# Patient Record
Sex: Female | Born: 1945 | Race: Asian | Hispanic: No | Smoking: Never smoker
Health system: Southern US, Community
[De-identification: ages and names within clinical notes are randomized; demographics above are authoritative.]

## PROBLEM LIST (undated history)

## (undated) DIAGNOSIS — D509 Iron deficiency anemia, unspecified: Secondary | ICD-10-CM

## (undated) DIAGNOSIS — K219 Gastro-esophageal reflux disease without esophagitis: Secondary | ICD-10-CM

## (undated) DIAGNOSIS — E7800 Pure hypercholesterolemia, unspecified: Secondary | ICD-10-CM

## (undated) DIAGNOSIS — L409 Psoriasis, unspecified: Secondary | ICD-10-CM

## (undated) DIAGNOSIS — E039 Hypothyroidism, unspecified: Secondary | ICD-10-CM

## (undated) DIAGNOSIS — M1990 Unspecified osteoarthritis, unspecified site: Secondary | ICD-10-CM

## (undated) DIAGNOSIS — N3289 Other specified disorders of bladder: Secondary | ICD-10-CM

## (undated) DIAGNOSIS — I10 Essential (primary) hypertension: Secondary | ICD-10-CM

## (undated) DIAGNOSIS — N189 Chronic kidney disease, unspecified: Principal | ICD-10-CM

## (undated) DIAGNOSIS — D631 Anemia in chronic kidney disease: Secondary | ICD-10-CM

## (undated) DIAGNOSIS — F419 Anxiety disorder, unspecified: Secondary | ICD-10-CM

## (undated) DIAGNOSIS — Z9289 Personal history of other medical treatment: Secondary | ICD-10-CM

## (undated) HISTORY — DX: Iron deficiency anemia, unspecified: D50.9

## (undated) HISTORY — DX: Pure hypercholesterolemia, unspecified: E78.00

## (undated) HISTORY — PX: OTHER SURGICAL HISTORY: SHX169

## (undated) HISTORY — DX: Unspecified osteoarthritis, unspecified site: M19.90

## (undated) HISTORY — DX: Anemia in chronic kidney disease: D63.1

## (undated) HISTORY — PX: CERVICAL DISC SURGERY: SHX588

## (undated) HISTORY — DX: Gastro-esophageal reflux disease without esophagitis: K21.9

## (undated) HISTORY — PX: ABDOMINAL HYSTERECTOMY: SHX81

## (undated) HISTORY — DX: Essential (primary) hypertension: I10

## (undated) HISTORY — PX: BREAST BIOPSY: SHX20

## (undated) HISTORY — DX: Personal history of other medical treatment: Z92.89

## (undated) HISTORY — PX: EYE SURGERY: SHX253

## (undated) HISTORY — PX: BREAST EXCISIONAL BIOPSY: SUR124

## (undated) HISTORY — DX: Other specified disorders of bladder: N32.89

## (undated) HISTORY — PX: CATARACT EXTRACTION: SUR2

## (undated) HISTORY — DX: Chronic kidney disease, unspecified: N18.9

---

## 1997-10-20 ENCOUNTER — Ambulatory Visit (HOSPITAL_COMMUNITY): Admission: RE | Admit: 1997-10-20 | Discharge: 1997-10-20 | Payer: Self-pay | Admitting: Cardiology

## 1998-03-15 ENCOUNTER — Ambulatory Visit (HOSPITAL_COMMUNITY): Admission: RE | Admit: 1998-03-15 | Discharge: 1998-03-15 | Payer: Self-pay | Admitting: Gastroenterology

## 1998-04-24 ENCOUNTER — Encounter: Payer: Self-pay | Admitting: Emergency Medicine

## 1998-04-24 ENCOUNTER — Emergency Department (HOSPITAL_COMMUNITY): Admission: EM | Admit: 1998-04-24 | Discharge: 1998-04-24 | Payer: Self-pay | Admitting: Emergency Medicine

## 1998-06-29 ENCOUNTER — Other Ambulatory Visit: Admission: RE | Admit: 1998-06-29 | Discharge: 1998-06-29 | Payer: Self-pay | Admitting: Obstetrics and Gynecology

## 1998-08-30 ENCOUNTER — Ambulatory Visit (HOSPITAL_COMMUNITY): Admission: RE | Admit: 1998-08-30 | Discharge: 1998-08-30 | Payer: Self-pay | Admitting: Gastroenterology

## 1999-03-15 ENCOUNTER — Encounter: Admission: RE | Admit: 1999-03-15 | Discharge: 1999-03-15 | Payer: Self-pay | Admitting: General Surgery

## 1999-03-15 ENCOUNTER — Encounter: Payer: Self-pay | Admitting: General Surgery

## 1999-06-28 ENCOUNTER — Encounter: Admission: RE | Admit: 1999-06-28 | Discharge: 1999-06-28 | Payer: Self-pay | Admitting: Gastroenterology

## 1999-06-28 ENCOUNTER — Encounter: Payer: Self-pay | Admitting: Gastroenterology

## 1999-08-30 ENCOUNTER — Encounter: Admission: RE | Admit: 1999-08-30 | Discharge: 1999-08-30 | Payer: Self-pay | Admitting: General Surgery

## 1999-08-30 ENCOUNTER — Other Ambulatory Visit: Admission: RE | Admit: 1999-08-30 | Discharge: 1999-08-30 | Payer: Self-pay | Admitting: General Surgery

## 1999-08-30 ENCOUNTER — Encounter: Payer: Self-pay | Admitting: General Surgery

## 1999-08-31 ENCOUNTER — Ambulatory Visit (HOSPITAL_BASED_OUTPATIENT_CLINIC_OR_DEPARTMENT_OTHER): Admission: RE | Admit: 1999-08-31 | Discharge: 1999-08-31 | Payer: Self-pay | Admitting: General Surgery

## 1999-08-31 ENCOUNTER — Encounter (INDEPENDENT_AMBULATORY_CARE_PROVIDER_SITE_OTHER): Payer: Self-pay | Admitting: Specialist

## 1999-09-18 ENCOUNTER — Other Ambulatory Visit: Admission: RE | Admit: 1999-09-18 | Discharge: 1999-09-18 | Payer: Self-pay | Admitting: Obstetrics and Gynecology

## 1999-12-31 ENCOUNTER — Encounter: Payer: Self-pay | Admitting: Otolaryngology

## 1999-12-31 ENCOUNTER — Encounter: Admission: RE | Admit: 1999-12-31 | Discharge: 1999-12-31 | Payer: Self-pay | Admitting: Otolaryngology

## 2000-03-10 ENCOUNTER — Ambulatory Visit (HOSPITAL_COMMUNITY): Admission: RE | Admit: 2000-03-10 | Discharge: 2000-03-10 | Payer: Self-pay | Admitting: Cardiology

## 2000-03-10 ENCOUNTER — Encounter: Payer: Self-pay | Admitting: Cardiology

## 2000-09-17 ENCOUNTER — Encounter: Payer: Self-pay | Admitting: General Surgery

## 2000-09-17 ENCOUNTER — Encounter: Admission: RE | Admit: 2000-09-17 | Discharge: 2000-09-17 | Payer: Self-pay | Admitting: General Surgery

## 2000-12-30 ENCOUNTER — Other Ambulatory Visit: Admission: RE | Admit: 2000-12-30 | Discharge: 2000-12-30 | Payer: Self-pay | Admitting: Obstetrics and Gynecology

## 2001-02-09 ENCOUNTER — Encounter: Admission: RE | Admit: 2001-02-09 | Discharge: 2001-02-09 | Payer: Self-pay | Admitting: Emergency Medicine

## 2001-02-09 ENCOUNTER — Encounter: Payer: Self-pay | Admitting: Emergency Medicine

## 2001-06-24 ENCOUNTER — Encounter: Payer: Self-pay | Admitting: General Surgery

## 2001-06-24 ENCOUNTER — Encounter: Admission: RE | Admit: 2001-06-24 | Discharge: 2001-06-24 | Payer: Self-pay | Admitting: General Surgery

## 2001-07-21 ENCOUNTER — Encounter: Payer: Self-pay | Admitting: Urology

## 2001-07-21 ENCOUNTER — Encounter: Admission: RE | Admit: 2001-07-21 | Discharge: 2001-07-21 | Payer: Self-pay | Admitting: Urology

## 2001-10-07 ENCOUNTER — Encounter: Admission: RE | Admit: 2001-10-07 | Discharge: 2001-10-07 | Payer: Self-pay | Admitting: General Surgery

## 2001-10-07 ENCOUNTER — Encounter: Payer: Self-pay | Admitting: General Surgery

## 2001-10-14 ENCOUNTER — Encounter: Payer: Self-pay | Admitting: Cardiology

## 2001-10-14 ENCOUNTER — Ambulatory Visit (HOSPITAL_COMMUNITY): Admission: RE | Admit: 2001-10-14 | Discharge: 2001-10-14 | Payer: Self-pay | Admitting: Cardiology

## 2001-10-27 ENCOUNTER — Encounter: Admission: RE | Admit: 2001-10-27 | Discharge: 2001-10-27 | Payer: Self-pay | Admitting: Emergency Medicine

## 2001-10-27 ENCOUNTER — Encounter: Payer: Self-pay | Admitting: Emergency Medicine

## 2002-01-04 ENCOUNTER — Other Ambulatory Visit: Admission: RE | Admit: 2002-01-04 | Discharge: 2002-01-04 | Payer: Self-pay | Admitting: Obstetrics and Gynecology

## 2002-01-29 ENCOUNTER — Encounter: Admission: RE | Admit: 2002-01-29 | Discharge: 2002-01-29 | Payer: Self-pay | Admitting: Obstetrics and Gynecology

## 2002-01-29 ENCOUNTER — Encounter: Payer: Self-pay | Admitting: Obstetrics and Gynecology

## 2002-03-23 ENCOUNTER — Ambulatory Visit (HOSPITAL_COMMUNITY): Admission: RE | Admit: 2002-03-23 | Discharge: 2002-03-23 | Payer: Self-pay | Admitting: Gastroenterology

## 2002-03-30 ENCOUNTER — Ambulatory Visit (HOSPITAL_BASED_OUTPATIENT_CLINIC_OR_DEPARTMENT_OTHER): Admission: RE | Admit: 2002-03-30 | Discharge: 2002-03-30 | Payer: Self-pay | Admitting: Urology

## 2002-03-30 ENCOUNTER — Encounter (INDEPENDENT_AMBULATORY_CARE_PROVIDER_SITE_OTHER): Payer: Self-pay | Admitting: Specialist

## 2002-07-06 ENCOUNTER — Encounter: Admission: RE | Admit: 2002-07-06 | Discharge: 2002-07-06 | Payer: Self-pay | Admitting: Emergency Medicine

## 2002-07-06 ENCOUNTER — Encounter: Payer: Self-pay | Admitting: Emergency Medicine

## 2002-07-23 ENCOUNTER — Ambulatory Visit (HOSPITAL_COMMUNITY): Admission: RE | Admit: 2002-07-23 | Discharge: 2002-07-23 | Payer: Self-pay | Admitting: Gastroenterology

## 2002-10-11 ENCOUNTER — Encounter: Admission: RE | Admit: 2002-10-11 | Discharge: 2002-10-11 | Payer: Self-pay | Admitting: General Surgery

## 2002-10-11 ENCOUNTER — Encounter: Payer: Self-pay | Admitting: General Surgery

## 2002-12-07 ENCOUNTER — Ambulatory Visit (HOSPITAL_COMMUNITY): Admission: RE | Admit: 2002-12-07 | Discharge: 2002-12-07 | Payer: Self-pay | Admitting: Neurology

## 2003-02-04 ENCOUNTER — Other Ambulatory Visit: Admission: RE | Admit: 2003-02-04 | Discharge: 2003-02-04 | Payer: Self-pay | Admitting: Obstetrics and Gynecology

## 2003-02-23 ENCOUNTER — Encounter: Admission: RE | Admit: 2003-02-23 | Discharge: 2003-02-23 | Payer: Self-pay | Admitting: Emergency Medicine

## 2003-06-15 ENCOUNTER — Ambulatory Visit (HOSPITAL_COMMUNITY): Admission: RE | Admit: 2003-06-15 | Discharge: 2003-06-15 | Payer: Self-pay | Admitting: Cardiology

## 2003-10-19 ENCOUNTER — Encounter: Admission: RE | Admit: 2003-10-19 | Discharge: 2003-10-19 | Payer: Self-pay | Admitting: General Surgery

## 2003-12-23 ENCOUNTER — Ambulatory Visit (HOSPITAL_COMMUNITY): Admission: RE | Admit: 2003-12-23 | Discharge: 2003-12-23 | Payer: Self-pay | Admitting: Gastroenterology

## 2004-10-19 ENCOUNTER — Ambulatory Visit (HOSPITAL_COMMUNITY): Admission: RE | Admit: 2004-10-19 | Discharge: 2004-10-19 | Payer: Self-pay | Admitting: Cardiology

## 2004-10-19 ENCOUNTER — Encounter: Admission: RE | Admit: 2004-10-19 | Discharge: 2004-10-19 | Payer: Self-pay | Admitting: General Surgery

## 2004-11-13 ENCOUNTER — Encounter: Admission: RE | Admit: 2004-11-13 | Discharge: 2004-11-13 | Payer: Self-pay | Admitting: Emergency Medicine

## 2004-11-23 ENCOUNTER — Encounter: Admission: RE | Admit: 2004-11-23 | Discharge: 2004-11-23 | Payer: Self-pay | Admitting: Emergency Medicine

## 2004-12-11 ENCOUNTER — Encounter: Admission: RE | Admit: 2004-12-11 | Discharge: 2004-12-11 | Payer: Self-pay | Admitting: Emergency Medicine

## 2004-12-26 ENCOUNTER — Encounter: Admission: RE | Admit: 2004-12-26 | Discharge: 2004-12-26 | Payer: Self-pay | Admitting: Emergency Medicine

## 2005-01-16 ENCOUNTER — Encounter: Admission: RE | Admit: 2005-01-16 | Discharge: 2005-01-16 | Payer: Self-pay | Admitting: Emergency Medicine

## 2005-09-06 ENCOUNTER — Encounter: Admission: RE | Admit: 2005-09-06 | Discharge: 2005-09-06 | Payer: Self-pay | Admitting: General Surgery

## 2005-10-11 ENCOUNTER — Encounter (HOSPITAL_COMMUNITY): Admission: RE | Admit: 2005-10-11 | Discharge: 2005-12-05 | Payer: Self-pay | Admitting: Cardiology

## 2006-09-11 ENCOUNTER — Encounter: Admission: RE | Admit: 2006-09-11 | Discharge: 2006-09-11 | Payer: Self-pay | Admitting: General Surgery

## 2006-09-23 ENCOUNTER — Encounter: Admission: RE | Admit: 2006-09-23 | Discharge: 2006-09-23 | Payer: Self-pay | Admitting: Gastroenterology

## 2006-11-11 ENCOUNTER — Encounter: Admission: RE | Admit: 2006-11-11 | Discharge: 2006-11-11 | Payer: Self-pay | Admitting: Orthopedic Surgery

## 2007-03-23 ENCOUNTER — Encounter: Admission: RE | Admit: 2007-03-23 | Discharge: 2007-03-23 | Payer: Self-pay | Admitting: Cardiology

## 2007-03-29 ENCOUNTER — Encounter: Admission: RE | Admit: 2007-03-29 | Discharge: 2007-03-29 | Payer: Self-pay | Admitting: Emergency Medicine

## 2007-09-14 ENCOUNTER — Encounter: Admission: RE | Admit: 2007-09-14 | Discharge: 2007-09-14 | Payer: Self-pay | Admitting: General Surgery

## 2007-11-11 ENCOUNTER — Encounter: Admission: RE | Admit: 2007-11-11 | Discharge: 2007-11-11 | Payer: Self-pay | Admitting: Emergency Medicine

## 2007-11-25 ENCOUNTER — Ambulatory Visit (HOSPITAL_COMMUNITY): Admission: RE | Admit: 2007-11-25 | Discharge: 2007-11-25 | Payer: Self-pay | Admitting: Cardiology

## 2007-12-02 ENCOUNTER — Encounter: Admission: RE | Admit: 2007-12-02 | Discharge: 2007-12-02 | Payer: Self-pay | Admitting: Emergency Medicine

## 2007-12-15 ENCOUNTER — Encounter: Admission: RE | Admit: 2007-12-15 | Discharge: 2007-12-15 | Payer: Self-pay | Admitting: Emergency Medicine

## 2008-04-18 ENCOUNTER — Encounter: Admission: RE | Admit: 2008-04-18 | Discharge: 2008-04-18 | Payer: Self-pay | Admitting: Dermatology

## 2008-09-12 ENCOUNTER — Encounter: Admission: RE | Admit: 2008-09-12 | Discharge: 2008-09-12 | Payer: Self-pay | Admitting: Neurosurgery

## 2008-09-14 ENCOUNTER — Encounter: Admission: RE | Admit: 2008-09-14 | Discharge: 2008-09-14 | Payer: Self-pay | Admitting: General Surgery

## 2008-12-02 ENCOUNTER — Encounter: Admission: RE | Admit: 2008-12-02 | Discharge: 2008-12-02 | Payer: Self-pay | Admitting: Internal Medicine

## 2008-12-30 ENCOUNTER — Other Ambulatory Visit: Payer: Self-pay | Admitting: Neurosurgery

## 2009-01-02 ENCOUNTER — Ambulatory Visit: Payer: Self-pay | Admitting: Internal Medicine

## 2009-01-02 ENCOUNTER — Inpatient Hospital Stay (HOSPITAL_COMMUNITY): Admission: EM | Admit: 2009-01-02 | Discharge: 2009-01-11 | Payer: Self-pay | Admitting: Emergency Medicine

## 2009-01-04 ENCOUNTER — Encounter (INDEPENDENT_AMBULATORY_CARE_PROVIDER_SITE_OTHER): Payer: Self-pay | Admitting: Neurosurgery

## 2009-01-04 ENCOUNTER — Encounter: Admission: RE | Admit: 2009-01-04 | Discharge: 2009-01-04 | Payer: Self-pay | Admitting: Neurosurgery

## 2009-01-06 ENCOUNTER — Ambulatory Visit: Payer: Self-pay | Admitting: Vascular Surgery

## 2009-01-06 ENCOUNTER — Encounter (INDEPENDENT_AMBULATORY_CARE_PROVIDER_SITE_OTHER): Payer: Self-pay | Admitting: Neurosurgery

## 2009-01-10 ENCOUNTER — Ambulatory Visit: Payer: Self-pay | Admitting: Physical Medicine & Rehabilitation

## 2009-01-11 ENCOUNTER — Inpatient Hospital Stay (HOSPITAL_COMMUNITY)
Admission: EM | Admit: 2009-01-11 | Discharge: 2009-01-19 | Payer: Self-pay | Admitting: Physical Medicine & Rehabilitation

## 2009-02-01 ENCOUNTER — Ambulatory Visit: Payer: Self-pay | Admitting: Pulmonary Disease

## 2009-02-01 DIAGNOSIS — R609 Edema, unspecified: Secondary | ICD-10-CM | POA: Insufficient documentation

## 2009-02-01 DIAGNOSIS — I10 Essential (primary) hypertension: Secondary | ICD-10-CM | POA: Insufficient documentation

## 2009-02-01 DIAGNOSIS — J810 Acute pulmonary edema: Secondary | ICD-10-CM | POA: Insufficient documentation

## 2009-02-07 ENCOUNTER — Telehealth: Payer: Self-pay | Admitting: Pulmonary Disease

## 2009-02-14 ENCOUNTER — Ambulatory Visit: Payer: Self-pay | Admitting: Pulmonary Disease

## 2009-02-14 LAB — CONVERTED CEMR LAB
ALT: 19 units/L (ref 0–35)
AST: 26 units/L (ref 0–37)
Albumin: 3.7 g/dL (ref 3.5–5.2)
Alkaline Phosphatase: 78 units/L (ref 39–117)
BUN: 12 mg/dL (ref 6–23)
Bilirubin, Direct: 0.1 mg/dL (ref 0.0–0.3)
CO2: 31 meq/L (ref 19–32)
Calcium: 9.4 mg/dL (ref 8.4–10.5)
Chloride: 102 meq/L (ref 96–112)
Creatinine, Ser: 1.1 mg/dL (ref 0.4–1.2)
GFR calc non Af Amer: 52.84 mL/min (ref >60)
Glucose, Bld: 102 mg/dL — ABNORMAL HIGH (ref 70–99)
Potassium: 4.5 meq/L (ref 3.5–5.1)
Sodium: 140 meq/L (ref 135–145)
Total Bilirubin: 0.7 mg/dL (ref 0.3–1.2)
Total Protein: 5.5 g/dL — ABNORMAL LOW (ref 6.0–8.3)

## 2009-02-15 ENCOUNTER — Telehealth: Payer: Self-pay | Admitting: Pulmonary Disease

## 2009-02-15 ENCOUNTER — Encounter: Payer: Self-pay | Admitting: Pulmonary Disease

## 2009-02-15 ENCOUNTER — Ambulatory Visit: Payer: Self-pay

## 2009-03-02 ENCOUNTER — Inpatient Hospital Stay (HOSPITAL_COMMUNITY): Admission: RE | Admit: 2009-03-02 | Discharge: 2009-03-03 | Payer: Self-pay | Admitting: Neurosurgery

## 2009-06-22 ENCOUNTER — Encounter: Admission: RE | Admit: 2009-06-22 | Discharge: 2009-06-22 | Payer: Self-pay | Admitting: General Surgery

## 2009-07-18 ENCOUNTER — Ambulatory Visit: Payer: Self-pay | Admitting: Hematology and Oncology

## 2009-08-01 LAB — CBC WITH DIFFERENTIAL/PLATELET
BASO%: 0.2 % (ref 0.0–2.0)
Basophils Absolute: 0 10*3/uL (ref 0.0–0.1)
EOS%: 4.6 % (ref 0.0–7.0)
Eosinophils Absolute: 0.3 10*3/uL (ref 0.0–0.5)
HCT: 30.9 % — ABNORMAL LOW (ref 34.8–46.6)
HGB: 10.3 g/dL — ABNORMAL LOW (ref 11.6–15.9)
LYMPH%: 31.3 % (ref 14.0–49.7)
MCH: 28.2 pg (ref 25.1–34.0)
MCHC: 33.5 g/dL (ref 31.5–36.0)
MCV: 84.3 fL (ref 79.5–101.0)
MONO#: 0.3 10*3/uL (ref 0.1–0.9)
MONO%: 5.1 % (ref 0.0–14.0)
NEUT#: 3.6 10*3/uL (ref 1.5–6.5)
NEUT%: 58.8 % (ref 38.4–76.8)
Platelets: 259 10*3/uL (ref 145–400)
RBC: 3.66 10*6/uL — ABNORMAL LOW (ref 3.70–5.45)
RDW: 13.7 % (ref 11.2–14.5)
WBC: 6.2 10*3/uL (ref 3.9–10.3)
lymph#: 1.9 10*3/uL (ref 0.9–3.3)

## 2009-08-01 LAB — MORPHOLOGY: PLT EST: ADEQUATE

## 2009-08-01 LAB — URINALYSIS, MICROSCOPIC - CHCC
Bilirubin (Urine): NEGATIVE
Blood: NEGATIVE
Glucose: NEGATIVE g/dL
Ketones: NEGATIVE mg/dL
Leukocyte Esterase: NEGATIVE
Nitrite: NEGATIVE
Protein: NEGATIVE mg/dL
RBC count: NEGATIVE (ref 0–2)
Specific Gravity, Urine: 1.005 (ref 1.003–1.035)
WBC, UA: NEGATIVE (ref 0–2)
pH: 6 (ref 4.6–8.0)

## 2009-08-01 LAB — HEMOGLOBIN A1C
Hgb A1c MFr Bld: 5.3 % (ref <5.7)
Mean Plasma Glucose: 105 mg/dL (ref <117)

## 2009-08-04 LAB — IRON AND TIBC
%SAT: 19 % — ABNORMAL LOW (ref 20–55)
Iron: 57 ug/dL (ref 42–145)
TIBC: 308 ug/dL (ref 250–470)
UIBC: 251 ug/dL

## 2009-08-04 LAB — PROTEIN ELECTROPHORESIS, SERUM, WITH REFLEX
Albumin ELP: 64.7 % (ref 55.8–66.1)
Alpha-1-Globulin: 3.7 % (ref 2.9–4.9)
Alpha-2-Globulin: 13.4 % — ABNORMAL HIGH (ref 7.1–11.8)
Beta 2: 3.8 % (ref 3.2–6.5)
Beta Globulin: 6.5 % (ref 4.7–7.2)
Gamma Globulin: 7.9 % — ABNORMAL LOW (ref 11.1–18.8)
Total Protein, Serum Electrophoresis: 6.3 g/dL (ref 6.0–8.3)

## 2009-08-04 LAB — IGG, IGA, IGM
IgA: 118 mg/dL (ref 68–378)
IgG (Immunoglobin G), Serum: 512 mg/dL — ABNORMAL LOW (ref 694–1618)
IgM, Serum: 34 mg/dL — ABNORMAL LOW (ref 60–263)

## 2009-08-04 LAB — COMPREHENSIVE METABOLIC PANEL
ALT: 26 U/L (ref 0–35)
AST: 28 U/L (ref 0–37)
Albumin: 4.2 g/dL (ref 3.5–5.2)
Alkaline Phosphatase: 102 U/L (ref 39–117)
BUN: 15 mg/dL (ref 6–23)
CO2: 24 mEq/L (ref 19–32)
Calcium: 9.7 mg/dL (ref 8.4–10.5)
Chloride: 103 mEq/L (ref 96–112)
Creatinine, Ser: 1 mg/dL (ref 0.40–1.20)
Glucose, Bld: 111 mg/dL — ABNORMAL HIGH (ref 70–99)
Potassium: 4.7 mEq/L (ref 3.5–5.3)
Sodium: 138 mEq/L (ref 135–145)
Total Bilirubin: 0.3 mg/dL (ref 0.3–1.2)
Total Protein: 6.3 g/dL (ref 6.0–8.3)

## 2009-08-04 LAB — ERYTHROPOIETIN: Erythropoietin: 18 m[IU]/mL (ref 2.6–34.0)

## 2009-08-04 LAB — DIRECT ANTIGLOBULIN TEST (NOT AT ARMC)
DAT (Complement): NEGATIVE
DAT IgG: NEGATIVE

## 2009-08-04 LAB — IFE INTERPRETATION

## 2009-08-04 LAB — VITAMIN B12: Vitamin B-12: 1813 pg/mL — ABNORMAL HIGH (ref 211–911)

## 2009-08-04 LAB — FERRITIN: Ferritin: 73 ng/mL (ref 10–291)

## 2009-08-10 ENCOUNTER — Other Ambulatory Visit
Admission: RE | Admit: 2009-08-10 | Discharge: 2009-08-10 | Payer: Self-pay | Source: Home / Self Care | Admitting: Hematology and Oncology

## 2009-08-11 LAB — ERYTHROPOIETIN: Erythropoietin: 13.3 m[IU]/mL (ref 2.6–34.0)

## 2009-08-22 ENCOUNTER — Ambulatory Visit: Payer: Self-pay | Admitting: Hematology and Oncology

## 2009-09-13 ENCOUNTER — Encounter: Admission: RE | Admit: 2009-09-13 | Discharge: 2009-09-13 | Payer: Self-pay | Admitting: General Surgery

## 2009-10-19 ENCOUNTER — Ambulatory Visit (HOSPITAL_COMMUNITY): Admission: RE | Admit: 2009-10-19 | Discharge: 2009-10-19 | Payer: Self-pay | Admitting: General Surgery

## 2010-01-02 ENCOUNTER — Telehealth (INDEPENDENT_AMBULATORY_CARE_PROVIDER_SITE_OTHER): Payer: Self-pay | Admitting: *Deleted

## 2010-01-16 ENCOUNTER — Ambulatory Visit: Payer: Self-pay | Admitting: Pulmonary Disease

## 2010-01-17 LAB — CONVERTED CEMR LAB
BUN: 21 mg/dL (ref 6–23)
CO2: 29 meq/L (ref 19–32)
Calcium: 10.7 mg/dL — ABNORMAL HIGH (ref 8.4–10.5)
Chloride: 96 meq/L (ref 96–112)
Creatinine, Ser: 1 mg/dL (ref 0.4–1.2)
GFR calc non Af Amer: 59.51 mL/min (ref >60)
Glucose, Bld: 124 mg/dL — ABNORMAL HIGH (ref 70–99)
Potassium: 4.8 meq/L (ref 3.5–5.1)
Pro B Natriuretic peptide (BNP): 188.7 pg/mL — ABNORMAL HIGH (ref 0.0–100.0)
Sodium: 131 meq/L — ABNORMAL LOW (ref 135–145)

## 2010-04-15 ENCOUNTER — Encounter: Payer: Self-pay | Admitting: General Surgery

## 2010-04-24 NOTE — Assessment & Plan Note (Signed)
Summary: ok to overbook per RA for swelling/mg   Visit Type:  Follow-up Copy to:  Dr. Collene Mares Primary Provider/Referring Provider:  Dr. Delfina Redwood  CC:  Pt c/o chest heaviness usually lasting 10-5 minutes, swelling in ankles, and breathing is okay. Pt requesting flu vaccine today.  History of Present Illness: [name], diabetic, non smoker for post hospital FU of pulmonary edema & ana sarca. Adm 10/20- 28 for L1-3 compression fracture after an accidental fall. CXR suggested pulmonary edema & CT chest showed BL patchy air space opacities & BL effusions. Echo showed EF 55-60%, cardiac cath showed nml coronaries, BNP was 156. E. coli UTI was treated with keflex & LFTs normalised. BP meds were changed  -tekturna, hydrallazine were stopped, Maxzide was started & [place] met dose was increased. Of note , other labs - Hb 9.8 attributed to iron deficiency, TSh 3.1, alb 2.7, UA prot neg  February 14, 2009  c/o peripheral edema. Lasix did not help much. Edema is better in evenings. Breathing is  normal.  Neck surgery planned for 12/7 by Dr Sherwood Gambler. BMET ok. duplex neg for DVT  January 16, 2010 1:56 PM  pedal edema has returned, she is concerned about 'fluid in lungs' BP better with amlodepin x 5 mnths - this controls her bp better than other drugs. NO dyspnea, cough or wheeze. BMET ok, BNP low  Preventive Screening-Counseling & Management  Alcohol-Tobacco     Smoking Status: never  Current Medications (verified): 1)  Lyrica 50 Mg Caps (Pregabalin) .... Take 1 Tablet By Mouth Three Times A Day 2)  Synthroid 88 Mcg Tabs (Levothyroxine Sodium) .... Take 1 Tablet By Mouth Once A Day 3)  Janumet 50-1000 Mg Tabs (Sitagliptin-Metformin Hcl) .... Take 1 1/2 Tablet By Mouth Once A Day 4)  Toprol Xl 100 Mg Xr24h-Tab (Metoprolol Succinate) .... Take 1  1/2tablet By Mouth Once A Day 5)  Elmiron 100 Mg Caps (Pentosan Polysulfate Sodium) .... Take 1 Tablet By Mouth Two Times A Day 6)  Nexium 40 Mg Cpdr (Esomeprazole  Magnesium) .... Take 1 Capsule By Mouth Once A Day 7)  Welchol 625 Mg Tabs (Colesevelam Hcl) .... Take 3 Tablet By Mouth Two Times A Day 8)  Aspirin 81 Mg  Tabs (Aspirin) .... Take 1 Tablet By Mouth Once A Day 9)  Multivitamins   Tabs (Multiple Vitamin) .... Take 1 Tablet By Mouth Once A Day 10)  Iron 325 (65 Fe) Mg Tabs (Ferrous Sulfate) .... Take 1 Tablet By Mouth Three Times A Day 11)  Lasix 40 Mg Tabs (Furosemide) .... Take 1 Tablet By Mouth Once A Day 12)  Norvasc 10 Mg Tabs (Amlodipine Besylate) .... Take 1 Tablet By Mouth Once A Day 13)  Welchol 3.75 Gm Pack (Colesevelam Hcl) .Marland Kitchen.. 1 Pack Daily  Allergies (verified): 1)  ! Lipitor (Atorvastatin) 2)  ! 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   Review of Systems       The patient complains of peripheral edema.  The patient denies anorexia, fever, weight loss, weight gain, vision loss, decreased hearing, hoarseness, chest pain, syncope, dyspnea on exertion, prolonged cough, headaches, hemoptysis, abdominal pain, melena, hematochezia, severe indigestion/heartburn, hematuria, muscle weakness, suspicious skin lesions, difficulty walking, depression, unusual weight change, abnormal bleeding, enlarged lymph nodes, and angioedema.    Vital Signs:  Patient profile:   65 year old  female Height:      61 inches Weight:      123.2 pounds BMI:     23.36 O2 Sat:      99 % on Room air Temp:     98.0 degrees F oral Pulse rate:   66 / minute BP sitting:   110 / 60  (left arm) Cuff size:   regular  Vitals Entered By: Iran Planas CMA (January 16, 2010 1:40 PM)  O2 Flow:  Room air CC: Pt c/o chest heaviness usually lasting 10-5 minutes, swelling in ankles, breathing is okay. Pt requesting flu vaccine today Comments Medications  reviewed with patient Verified contact number and pharmacy with patient Iran Planas [facility]  January 16, 2010 1:41 PM    Physical Exam  Additional Exam:  [id number] January 16, 2010 Gen. 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 I have asked her to aim for a body weight of around 120 lbs with a scale at home & use lasix if she reaches 125 lbs  Medications Added to Medication List This Visit: 1)  Aspirin 81 Mg Tabs (Aspirin) .... Take 1 tablet by mouth once a day 2)  Norvasc 10 Mg Tabs (Amlodipine besylate) .... Take 1 tablet by mouth once a day 3)  Welchol 3.75 Gm Pack (Colesevelam hcl) .Marland Kitchen.. 1 pack daily  Other Orders: Est. Patient Level III [id number]) Prescription Created Electronically [phone]) TLB-BMP (Basic Metabolic Panel-BMET) (99991111) TLB-BNP (B-Natriuretic Peptide) (83880-BNPR)  Patient Instructions: 1)  Copy sent to: dr Elisabeth Cara, dr Elayne Snare, dr polite 2)  Please schedule a follow-up appointment in 3 months. 3)  Weigh every other day  - your baseline wt is 120 lbs - If wt > 125 lbs , take lasix once daily x 1 week 4)  Blood work today 5)  Flu shot

## 2010-04-24 NOTE — Assessment & Plan Note (Signed)
Summary: rov//mbw   Visit Type:  Follow-up Copy to:  Dr. Collene Mares Primary Provider/Referring Provider:  Dr. Delfina Redwood  CC:  Pt here for follow up. Pt c/o ankle swelling worse when active and decrease when elevated. Pt states needs new Rx for Lasix 40mg  daily if to continue same. Pt requesting Liver function blood work.  History of Present Illness: [name], diabetic, non smoker for post hospital FU of pulmonary edema & ana sarca. Adm 10/20- 28 for L1-3 compression fracture after an accidental fall. CXR suggested pulmonary edema & CT chest showed BL patchy air space opacities & BL effusions. Echo showed EF 55-60%, cardiac cath showed nml coronaries, BNP was 156. E. coli UTI was treated with keflex & LFTs normalised. BP meds were changed  -tekturna, hydrallazine were stopped, Maxzide was started & [place] met dose was increased. Of note , other labs - Hb 9.8 attributed to iron deficiency, TSh 3.1, alb 2.7, UA prot neg  February 14, 2009  c/o peripheral edema. Lasix did not help much. Edema is better in evenings. Breathing is  normal, no cough, chest pan, palpitations.  Neck surgery planned for 12/7 by Dr Sherwood Gambler. BMET ok. sugars ok without actose & BP ok after stopping amlodepin.  Current Medications (verified): 1)  Lyrica 50 Mg Caps (Pregabalin) .... Take 1 Tablet By Mouth Three Times A Day 2)  Synthroid 88 Mcg Tabs (Levothyroxine Sodium) .... Take 1 Tablet By Mouth Once A Day 3)  Janumet 50-1000 Mg Tabs (Sitagliptin-Metformin Hcl) .... Take 1 1/2 Tablet By Mouth Once A Day 4)  Oxycodone Hcl 5 Mg Tabs (Oxycodone Hcl) .... As Needed 5)  Toprol Xl 100 Mg Xr24h-Tab (Metoprolol Succinate) .... Take 1  1/2tablet By Mouth Once A Day 6)  Elmiron 100 Mg Caps (Pentosan Polysulfate Sodium) .... Take 1 Tablet By Mouth Two Times A Day 7)  Nexium 40 Mg Cpdr (Esomeprazole Magnesium) .... Take 1 Capsule By Mouth Once A Day 8)  Welchol 625 Mg Tabs (Colesevelam Hcl) .... Take 3 Tablet By Mouth Two Times A Day 9)   Aspirin 325 Mg  Tabs (Aspirin) .... Take 1 Tablet By Mouth Once A Day 10)  Multivitamins   Tabs (Multiple Vitamin) .... Take 1 Tablet By Mouth Once A Day 11)  Iron 325 (65 Fe) Mg Tabs (Ferrous Sulfate) .... Take 1 Tablet By Mouth Three Times A Day 12)  Vitamin B-12 1000 Mcg Tabs (Cyanocobalamin) .... Take 1 Tablet By Mouth Once A Day 13)  Lasix 20 Mg Tabs (Furosemide) .... 2 Once Daily  Allergies (verified): 1)  ! Lipitor (Atorvastatin) 2)  ! 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   Review of Systems  The patient denies anorexia, fever, weight loss, weight gain, vision loss, decreased hearing, hoarseness, chest pain, syncope, dyspnea on exertion, peripheral edema, prolonged cough, headaches, hemoptysis, abdominal pain, melena, hematochezia, severe indigestion/heartburn, hematuria, muscle weakness, suspicious skin lesions, difficulty walking, depression, unusual weight change, and abnormal bleeding.    Vital Signs:  Patient profile:   65 year old female Height:      61 inches Weight:      133.25 pounds O2 Sat:      98 % on Room air Temp:     97.7 degrees F oral Pulse rate:   70 / minute BP sitting:   100 / 60  (left arm) Cuff  size:   regular  Vitals Entered By: Iran Planas CMA (February 14, 2009 4:21 PM)  O2 Flow:  Room air  Physical Exam  Additional Exam:  Gen. Pleasant, well-nourished, in no distress, normal affect ENT - no lesions, no post nasal drip Neck: No JVD, no thyromegaly, no carotid bruits Lungs: no use of accessory muscles, no dullness to percussion, clear without rales or rhonchi  Cardiovascular: Rhythm regular, heart sounds  normal, no murmurs or gallops, 2+ peripheral edema Musculoskeletal: No deformities, no cyanosis or clubbing, no calf  tenderness      Impression & Recommendations:  Problem # 1:  PULMONARY EDEMA (ICD-518.4)  resolved  Orders: Est. 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 Patient Level III [id number])  Medications Added to Medication List This Visit: 1)  Lasix 20 Mg Tabs (Furosemide) .... 2 once daily  Patient Instructions: 1)  Copy sent to: Drs Beverly Gust 2)  A color venous doppler study has been recommended.  Your imaging study may require preauthorization.  3)  Stop lasix 4)  Keep legs elevated during sleep, wear support hose during daytime

## 2010-04-24 NOTE — Progress Notes (Signed)
Summary: legs/ hands still swollen  Phone Note Call from Patient Call back at Home Phone [phone]   Caller: Patient Call For: alva Summary of Call: pt c/o of sweeling in legs/ hands. wonders if she could increase her lasix?  Initial call taken by: Cooper Render,  February 07, 2009 10:34 AM  Follow-up for Phone Call        please advise. Wilcox Bing CMA  February 07, 2009 11:06 AM increase lasix to 40 mg ( 2 pills ) once daily  Follow-up by: Leanna Sato. Elsworth Soho MD,  February 07, 2009 4:37 PM  Additional Follow-up for Phone Call Additional follow up Details #1::        Pt aware of RA's recs. Clayborne Dana CMA  February 08, 2009 9:24 AM

## 2010-04-24 NOTE — Assessment & Plan Note (Signed)
Summary: per Armando Bukhari-jwr   Visit Type:  Initial Consult Copy to:  Dr. Collene Mares Primary Provider/Referring Provider:  Dr. Delfina Redwood  CC:  Pt here for pulmonary consult. Pt c/o fluid on lungs.  History of Present Illness: 65/F, diabetic, non smoker for post hospital FU of pulmonary edema & ana sarca. Adm 10/20- 28 for L1-3 compression fracture after an accidental fall. CXR suggested pulmonary edema & CT chest showed BL patchy air space opacities & BL effusions. Echo showed EF 55-60%, cardiac cath showed nml coronaries, BNP was 156. E. coli UTI was treated with keflex & LFTs normalised. BP meds were changed  -tekturna, hydrallazine were stopped, Maxzide was started & [place] met dose was increased. Of note , other labs - Hb 9.8 attributed to iron deficiency, TSh 3.1, alb 2.7, UA prot neg She now c/o peripheral edema. Breathing is near normal, no cough, chest pan, palpitations.    Preventive Screening-Counseling & Management  Alcohol-Tobacco     Smoking Status: never  Current Medications (verified): 1)  Lyrica 50 Mg Caps (Pregabalin) .... Take 1 Tablet By Mouth Three Times A Day 2)  Synthroid 88 Mcg Tabs (Levothyroxine Sodium) .... Take 1 Tablet By Mouth Once A Day 3)  Actos 15 Mg Tabs (Pioglitazone Hcl) .... Take 1 Tablet By Mouth Once A Day 4)  Janumet 50-1000 Mg Tabs (Sitagliptin-Metformin Hcl) .... Take 1 1/2 Tablet By Mouth Once A Day 5)  Triamterene-Hctz 37.5-25 Mg Tabs (Triamterene-Hctz) .... Take 1 Tablet By Mouth Once A Day 6)  Oxycodone Hcl 5 Mg Tabs (Oxycodone Hcl) .... As Needed 7)  Toprol Xl 100 Mg Xr24h-Tab (Metoprolol Succinate) .... Take 1  1/2tablet By Mouth Once A Day 8)  Elmiron 100 Mg Caps (Pentosan Polysulfate Sodium) .... Take 1 Tablet By Mouth Two Times A Day 9)  Nexium 40 Mg Cpdr (Esomeprazole Magnesium) .... Take 1 Capsule By Mouth Once A Day 10)  Welchol 625 Mg Tabs (Colesevelam Hcl) .... Take 3 Tablet By Mouth Two Times A Day 11)  Aspirin 325 Mg  Tabs (Aspirin) .... Take 1  Tablet By Mouth Once A Day 12)  Multivitamins   Tabs (Multiple Vitamin) .... Take 1 Tablet By Mouth Once A Day 13)  Iron 325 (65 Fe) Mg Tabs (Ferrous Sulfate) .... Take 1 Tablet By Mouth Three Times A Day 14)  Vitamin B-12 1000 Mcg Tabs (Cyanocobalamin) .... Take 1 Tablet By Mouth Once A Day  Allergies (verified): 1)  ! Lipitor (Atorvastatin) 2)  ! 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  Smoking Status:  never  Review of Systems       The patient complains of shortness of breath with activity, acid heartburn, itching, and hand/feet swelling.  The patient denies shortness of breath at rest, productive cough, non-productive cough, coughing up blood, chest pain, irregular heartbeats, indigestion, loss of appetite, weight change, abdominal pain, difficulty swallowing, sore throat, tooth/dental problems, headaches, nasal congestion/difficulty breathing through nose, sneezing, ear ache, anxiety, depression, joint stiffness or pain, rash, change in color of mucus, and fever.    Vital Signs:  Patient profile:   65 year old female Height:  61 inches Weight:      133.50 pounds BMI:     25.32 O2 Sat:      100 % on Room air Temp:     97.7 degrees F oral Pulse rate:   65 / minute BP sitting:   124 / 70  (left arm) Cuff size:   regular  Vitals Entered By: Iran Planas CMA (February 01, 2009 2:15 PM)  O2 Flow:  Room air CC: Pt here for pulmonary consult. Pt c/o fluid on lungs Comments  Medications reviewed with patient Iran Planas CMA  February 01, 2009 2:27 PM    Physical Exam  Additional Exam:  Gen. Pleasant, well-nourished, in no distress, normal affect ENT - no lesions, no post nasal drip Neck: No JVD, no thyromegaly, no carotid bruits Lungs: no use of accessory muscles, no dullness to percussion, clear without rales or rhonchi  Cardiovascular: Rhythm regular, heart sounds  normal, no murmurs or gallops, 2+ peripheral edema Abdomen: soft and non-tender, no hepatosplenomegaly, BS normal. Musculoskeletal: No deformities, no cyanosis or clubbing Neuro:  alert, non focal     CXR  Procedure date:  02/01/2009  Findings:      Comparison: Portable chest x-ray of 01/04/2009   Findings: Cardiomegaly is stable.  No active infiltrate or effusion is seen.  There are degenerative changes throughout the thoracic spine.   IMPRESSION: Stable cardiomegaly.  No active lung disease.  Impression & Recommendations:  Problem # 1:  EDEMA LEGS (Z5899001.3) Assessment Deteriorated Nml renal , thyroid & liver function, low albumin, Nml Lv function. DD here includes actose or nephrotic syndrome from diabetes. Stop actose & start low dose lasix 20 mg , check BMET in 1-2 weeks for K  Problem # 2:  PULMONARY EDEMA (ICD-518.4) resolved Orders: Est. Patient Level IV [id number]) TLB-BMP (Basic Metabolic Panel-BMET) (99991111) T-2 View CXR (71020TC)  Medications Added to Medication List This Visit: 1)  Lyrica 50 Mg Caps (Pregabalin) .... Take 1 tablet by mouth three times a day 2)  Synthroid 88 Mcg Tabs (Levothyroxine sodium) .... Take 1 tablet by mouth once a day 3)  Actos 15 Mg Tabs (Pioglitazone hcl) .... Take 1 tablet by mouth once a day 4)  Janumet 50-1000 Mg Tabs (Sitagliptin-metformin hcl) .... Take 1 1/2 tablet by mouth once a day 5)  Triamterene-hctz 37.5-25 Mg Tabs (Triamterene-hctz) .... Take 1 tablet by mouth once a day 6)  Oxycodone Hcl 5 Mg Tabs (Oxycodone  hcl) .... As needed 7)  Toprol Xl 100 Mg Xr24h-tab (Metoprolol succinate) .... Take 1  1/2tablet by mouth once a day 8)  Elmiron 100 Mg Caps (Pentosan polysulfate sodium) .... Take 1 tablet by mouth two times a day 9)  Nexium 40 Mg Cpdr (Esomeprazole magnesium) .... Take 1 capsule by mouth once a day 10)  Welchol 625 Mg Tabs (Colesevelam hcl) .... Take 3 tablet by mouth two times a day 11)  Aspirin 325 Mg Tabs (Aspirin) .... Take 1 tablet by mouth once a day 12)  Multivitamins Tabs (Multiple vitamin) .... Take 1 tablet by mouth once a day 13)  Iron 325 (65 Fe) Mg Tabs (Ferrous sulfate) .... Take 1 tablet by mouth three times a day 14)  Vitamin B-12 1000 Mcg Tabs (Cyanocobalamin) .... Take 1 tablet by mouth once a day 15)  Lasix 20 Mg Tabs (Furosemide) .... Once daily  Patient Instructions: 1)  Copy sent to:Dr Dwyane Dee, Dr Collene Mares, Dr Elisabeth Cara 2)  Stop actose x 2 weeks 3)  Take LASIX 20  mg instead of Triamterene - thiazide  4)  Blood work x 2 weeks  5)  A chest x-ray has been recommended.  Your imaging study may require preauthorization.  6)  Please schedule a follow-up appointment in 2 weeks. Prescriptions: LASIX 20 MG TABS (FUROSEMIDE) once daily  #30 x 1   Entered and Authorized by:   Leanna Sato. Elsworth Soho MD   Signed by:   Leanna Sato Elsworth Soho MD on 02/01/2009   Method used:   [facility]        [facility]  [address] [phone]* (retail)       [address]       [place], [place]  [address]       Ph: [phone] or [phone]       Fax: [phone]   RxID:   [id number]

## 2010-04-24 NOTE — Progress Notes (Signed)
Summary: lab results/ fax request  Phone Note Call from Patient Call back at Home Phone [phone]   Caller: Patient Call For: alva Summary of Call: pt wants results of her labs. also wants these sent to dr Juanita Craver fax # [phone] Initial call taken by: Cooper Render,  February 15, 2009 11:28 AM  Follow-up for Phone Call        pt calling for lab results done on 02-14-2009.  Labs signed but pt stating she never got these results. Please advise.  Thank you.  Jinny Blossom Reynolds LPN  November 24, 624THL 11:57 AM   let her know renal & liver function ok.  pl fax to dr Collene Mares Follow-up by: Leanna Sato. Elsworth Soho MD,  February 15, 2009 1:34 PM  Additional Follow-up for Phone Call Additional follow up Details #1::        advise dpt of results. Pt also request these be faxed to Dr. Delfina Redwood and Dr. Dwyane Dee as well as Dr. Collene Mares, I have faxed reports to all 3 MD. Troy Bing CMA  February 15, 2009 1:42 PM

## 2010-04-24 NOTE — Miscellaneous (Signed)
Summary: Orders Update  Clinical Lists Changes  Orders: Added new Test order of Venous Duplex Lower Extremity (Venous Duplex Lower) - Signed 

## 2010-04-24 NOTE — Progress Notes (Signed)
Summary: Swelling in extremities----rx for Lasix and ov with RA for 10/27  Phone Note Call from Patient Call back at [phone]   Caller: [phone] Call For: Dr. Elsworth Soho Summary of Call: (Patient difficult to understand)  Having swelling in her feet, hands and face for "so many days."  Takes no medication.  Call [phone] or cell [phone].  Wants appointment with Dr. Elsworth Soho and no one else. Initial call taken by: Jacqualine Code,  January 02, 2010 3:14 PM  Follow-up for Phone Call        Called, spoke with pt but d/t language barrier all I could understanding was pt is having swelling in legs, hands, and face for "so many days."  I attempted to ask pt about SOB and sounded like she said she has it at times but sounds like main sxs at this time are swelling.  I asked pt to call her PCP Dr. Delfina Redwood for this but pt states she wants to see RA bc she "don't understanding english too much and he speaks my language."  Offered OV today in [place] but pt states she needs [place] office.  First available in [place] in 11.4.11.  Dr. Elsworth Soho, pls advise if you are ok with working pt in for this.  Thanks! Follow-up by: Raymondo Band RN,  January 02, 2010 3:38 PM  Additional Follow-up for Phone Call Additional follow up Details #1::        OK to double book on 10/27 as last pt she can take lasix 40 mg until then or see TP with family member who speaks english before that if required Additional Follow-up by: Leanna Sato. Elsworth Soho MD,  January 02, 2010 4:09 PM    Additional Follow-up for Phone Call Additional follow up Details #2::    called and spoke with pt.  informed pt of RA's recs.  Pt scheduled to see RA on 01/18/2010 at 4:30pm.  I mailed pt an appt card with this appt date and time per her request.  (verified her home address)  Pt [facility] [address]  Informed pt will send rx.  Jinny Blossom Reynolds LPN  October 11, 624THL 4:25 PM  ***** RA ok'd for pt to get Lasix 40mg  but doesn't  specify if this is daily or Prn !??!?!  Please advise.Matthew Folks LPN  October 11, 624THL 4:26 PM   lasix 40 once daily x 20 Follow-up by: Leanna Sato. Elsworth Soho MD,  January 02, 2010 4:34 PM  New/Updated Medications: LASIX 40 MG TABS (FUROSEMIDE) Take 1 tablet by mouth once a day Prescriptions: LASIX 40 MG TABS (FUROSEMIDE) Take 1 tablet by mouth once a day  #20 x 0   Entered by:   Matthew Folks LPN   Authorized by:   Leanna Sato. Elsworth Soho MD   Signed by:   Matthew Folks LPN on [id number]   Method used:   [facility]        [facility] [address] [phone]* (retail)       [address]       [place], [place]  [address]       Ph: [phone] or [phone]       Fax: [phone]   RxID:   [id number]

## 2010-06-08 ENCOUNTER — Encounter: Payer: Self-pay | Admitting: Pulmonary Disease

## 2010-06-08 ENCOUNTER — Ambulatory Visit (INDEPENDENT_AMBULATORY_CARE_PROVIDER_SITE_OTHER): Payer: Medicare Other | Admitting: Pulmonary Disease

## 2010-06-08 DIAGNOSIS — R609 Edema, unspecified: Secondary | ICD-10-CM

## 2010-06-09 LAB — CBC
HCT: 31.4 % — ABNORMAL LOW (ref 36.0–46.0)
Hemoglobin: 10.4 g/dL — ABNORMAL LOW (ref 12.0–15.0)
MCH: 27.8 pg (ref 26.0–34.0)
MCHC: 33 g/dL (ref 30.0–36.0)
MCV: 84.1 fL (ref 78.0–100.0)
Platelets: 229 10*3/uL (ref 150–400)
RBC: 3.73 MIL/uL — ABNORMAL LOW (ref 3.87–5.11)
RDW: 14.4 % (ref 11.5–15.5)
WBC: 6.4 10*3/uL (ref 4.0–10.5)

## 2010-06-09 LAB — COMPREHENSIVE METABOLIC PANEL
ALT: 21 U/L (ref 0–35)
AST: 25 U/L (ref 0–37)
Albumin: 3.8 g/dL (ref 3.5–5.2)
Alkaline Phosphatase: 94 U/L (ref 39–117)
BUN: 13 mg/dL (ref 6–23)
CO2: 28 mEq/L (ref 19–32)
Calcium: 10 mg/dL (ref 8.4–10.5)
Chloride: 102 mEq/L (ref 96–112)
Creatinine, Ser: 0.94 mg/dL (ref 0.4–1.2)
GFR calc Af Amer: >60 mL/min (ref >60)
GFR calc non Af Amer: 60 mL/min — ABNORMAL LOW (ref >60)
Glucose, Bld: 235 mg/dL — ABNORMAL HIGH (ref 70–99)
Potassium: 4.3 mEq/L (ref 3.5–5.1)
Sodium: 135 mEq/L (ref 135–145)
Total Bilirubin: 0.4 mg/dL (ref 0.3–1.2)
Total Protein: 6.2 g/dL (ref 6.0–8.3)

## 2010-06-09 LAB — DIFFERENTIAL
Basophils Absolute: 0 10*3/uL (ref 0.0–0.1)
Basophils Relative: 0 % (ref 0–1)
Eosinophils Absolute: 0.3 10*3/uL (ref 0.0–0.7)
Eosinophils Relative: 5 % (ref 0–5)
Lymphocytes Relative: 28 % (ref 12–46)
Lymphs Abs: 1.8 10*3/uL (ref 0.7–4.0)
Monocytes Absolute: 0.3 10*3/uL (ref 0.1–1.0)
Monocytes Relative: 5 % (ref 3–12)
Neutro Abs: 4 10*3/uL (ref 1.7–7.7)
Neutrophils Relative %: 62 % (ref 43–77)

## 2010-06-09 LAB — GLUCOSE, CAPILLARY
Glucose-Capillary: 139 mg/dL — ABNORMAL HIGH (ref 70–99)
Glucose-Capillary: 166 mg/dL — ABNORMAL HIGH (ref 70–99)

## 2010-06-09 LAB — SURGICAL PCR SCREEN
MRSA, PCR: NEGATIVE
Staphylococcus aureus: POSITIVE — AB

## 2010-06-11 LAB — DIFFERENTIAL
Basophils Absolute: 0 10*3/uL (ref 0.0–0.1)
Basophils Relative: 0 % (ref 0–1)
Eosinophils Absolute: 0.3 10*3/uL (ref 0.0–0.7)
Eosinophils Relative: 5 % (ref 0–5)
Lymphocytes Relative: 30 % (ref 12–46)
Lymphs Abs: 1.7 10*3/uL (ref 0.7–4.0)
Monocytes Absolute: 0.2 10*3/uL (ref 0.1–1.0)
Monocytes Relative: 4 % (ref 3–12)
Neutro Abs: 3.4 10*3/uL (ref 1.7–7.7)
Neutrophils Relative %: 61 % (ref 43–77)

## 2010-06-11 LAB — CBC
HCT: 30.8 % — ABNORMAL LOW (ref 36.0–46.0)
Hemoglobin: 9.8 g/dL — ABNORMAL LOW (ref 12.0–15.0)
MCHC: 31.8 g/dL (ref 30.0–36.0)
MCV: 85.9 fL (ref 78.0–100.0)
Platelets: 247 10*3/uL (ref 150–400)
RBC: 3.58 MIL/uL — ABNORMAL LOW (ref 3.87–5.11)
RDW: 13.9 % (ref 11.5–15.5)
WBC: 5.6 10*3/uL (ref 4.0–10.5)

## 2010-06-11 LAB — BONE MARROW EXAM

## 2010-06-11 LAB — CHROMOSOME ANALYSIS, BONE MARROW

## 2010-06-12 NOTE — Assessment & Plan Note (Signed)
Summary: rov//sh   Copy to:  Dr. Collene Mares Primary Provider/Referring Provider:  Dr. Delfina Redwood   History of Present Illness: 4/F, diabetic, non smoker for  FU of pedal edema Adm 10/20- 28/10 for L1-3 compression fracture after an accidental fall. CXR suggested pulmonary edema & CT chest showed BL patchy air space opacities & BL effusions. Echo showed EF 55-60%, cardiac cath showed nml coronaries, BNP was 156. E. coli UTI was treated with keflex & LFTs normalised. BP meds were changed  -tekturna, hydrallazine were stopped, Maxzide was started & [place] met dose was increased. Of note , other labs - Hb 9.8 attributed to iron deficiency, TSh 3.1, alb 2.7, UA prot neg  February 14, 2009  c/o peripheral edema. Lasix did not help much. Edema is better in evenings. Breathing is  normal.  Neck surgery planned for 12/7 by Dr Sherwood Gambler. BMET ok. duplex neg for DVT BNP low 10/11   June 08, 2010 2:57 PM  - 6 mnth FU pedal edema is now long standing, this precedes starting norvasc , she is concerned about 'fluid in lungs', wt unchanged, is watching salt in her diet NO dyspnea, cough or wheeze.   Preventive Screening-Counseling & Management  Alcohol-Tobacco     Smoking Status: never  Current Medications (verified): 1)  Lyrica 50 Mg Caps (Pregabalin) .... Take 1 Tablet By Mouth Three Times A Day 2)  Synthroid 75 Mcg Tabs (Levothyroxine Sodium) .... Take 1 Tablet By Mouth Once A Day 3)  Janumet 50-1000 Mg Tabs (Sitagliptin-Metformin Hcl) .... Take 1 Tablet By Mouth Two Times A Day 4)  Toprol Xl 100 Mg Xr24h-Tab (Metoprolol Succinate) .... Take 1  1/2tablet By Mouth Once A Day 5)  Elmiron 100 Mg Caps (Pentosan Polysulfate Sodium) .... Take 1 Tablet By Mouth Two Times A Day 6)  Dexilant 60 Mg Cpdr (Dexlansoprazole) .... Take 1 Tablet By Mouth Once A Day 7)  Welchol 3.75 Gm Pack (Colesevelam Hcl) .... Once Daily 8)  Aspirin 81 Mg  Tabs (Aspirin) .... Take 1 Tablet By Mouth Once A Day 9)  Multivitamins   Tabs  (Multiple Vitamin) .... Take 1 Tablet By Mouth Once A Day 10)  Iron 325 (65 Fe) Mg Tabs (Ferrous Sulfate) .... Take 1 Tablet By Mouth Three Times A Day 11)  Lasix 40 Mg Tabs (Furosemide) .... Take 1 Tablet By Mouth Once A Day As Directed For Edema 12)  Norvasc 10 Mg Tabs (Amlodipine Besylate) .... Take 1 Tablet By Mouth Once A Day 13)  Welchol 3.75 Gm Pack (Colesevelam Hcl) .Marland Kitchen.. 1 Pack Daily 14)  Hyzaar 100-25 Mg Tabs (Losartan Potassium-Hctz) .... Take 1 Tablet By Mouth Once A Day 15)  Glyset 25 Mg Tabs (Miglitol) .... Take 1 Tablet By Mouth Once A Day  Allergies (verified): 1)  ! Lipitor (Atorvastatin) 2)  ! 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   Review of Systems       The patient complains of peripheral edema.  The patient denies anorexia, fever, weight loss, weight gain, vision loss, decreased hearing, hoarseness, chest pain, syncope, dyspnea on exertion, prolonged cough, headaches, hemoptysis, abdominal pain, melena, hematochezia, severe indigestion/heartburn, muscle weakness, suspicious skin lesions, transient blindness, difficulty walking, depression, unusual weight change, abnormal bleeding, and enlarged lymph nodes.    Vital Signs:  Patient profile:   65 year old female Height:  61 inches Weight:      122 pounds BMI:     23.14 O2 Sat:      100 % on Room air Temp:     97.4 degrees F oral Pulse rate:   59 / minute BP sitting:   118 / 60  (left arm) Cuff size:   regular  Vitals Entered By: Iran Planas CMA (June 08, 2010 2:35 PM)  O2 Flow:  Room air Comments Medications reviewed with patient Verified contact number and pharmacy with patient Iran Planas [facility]  June 08, 2010 2:36 PM    Physical Exam  Additional Exam:  123 January 16, 2010 >>  122 June 08, 2010  Gen. Pleasant, well-nourished, in no distress, normal affect ENT - no lesions, no post nasal drip Neck: No JVD, no thyromegaly, no carotid bruits Lungs: no use of accessory muscles, no dullness to percussion, clear without rales or rhonchi  Cardiovascular: Rhythm regular, heart sounds  normal, no murmurs or gallops, 2+ peripheral edema Musculoskeletal: No deformities, no cyanosis or clubbing, no calf tenderness      Impression & Recommendations:  Problem # 1:  EDEMA LEGS (ICD-782.3)  Unclear if this is related to amlodipine or diastolic dysfn, low BNP noted No e/o pulmonary edema this time. I have asked her to aim for a body weight of around 120 lbs with a scale at home & use lasix if she reaches 125 lbs COmpresion stockings, limb elevation as much as possible for venous insufficiency LIver, renal fn nml  Orders: Est. Patient Level III [id number])  Medications Added to Medication List This Visit: 1)  Synthroid 75 Mcg Tabs (Levothyroxine sodium) .... Take 1 tablet by mouth once a day 2)  Janumet 50-1000 Mg Tabs (Sitagliptin-metformin hcl) .... Take 1 tablet by mouth two times a day 3)  Dexilant 60 Mg Cpdr (Dexlansoprazole) .... Take 1 tablet by mouth once a day 4)  Welchol 3.75 Gm Pack (Colesevelam hcl) .... Once daily 5)  Lasix 40 Mg Tabs (Furosemide) .... Take 1 tablet by mouth once a day as directed for edema 6)  Hyzaar 100-25 Mg Tabs (Losartan potassium-hctz) .... Take 1 tablet by mouth once a day 7)  Glyset 25 Mg Tabs (Miglitol) .... Take 1 tablet by mouth once a day  Patient Instructions: 1)  Copy sent to: dr Elisabeth Cara, dr Elayne Snare 2)  Weigh every other day  - your baseline wt is 120 lbs - If wt > 125 lbs , take lasix once daily x 1 week 3)  Compression stockings daily 4)  Keep feet elevated 5)  Please schedule a follow-up appointment as needed.

## 2010-06-26 LAB — CBC
HCT: 33.2 % — ABNORMAL LOW (ref 36.0–46.0)
Hemoglobin: 10.7 g/dL — ABNORMAL LOW (ref 12.0–15.0)
MCHC: 32.2 g/dL (ref 30.0–36.0)
MCV: 85.3 fL (ref 78.0–100.0)
Platelets: 236 10*3/uL (ref 150–400)
RBC: 3.89 MIL/uL (ref 3.87–5.11)
RDW: 15.1 % (ref 11.5–15.5)
WBC: 6.9 10*3/uL (ref 4.0–10.5)

## 2010-06-26 LAB — GLUCOSE, CAPILLARY
Glucose-Capillary: 109 mg/dL — ABNORMAL HIGH (ref 70–99)
Glucose-Capillary: 123 mg/dL — ABNORMAL HIGH (ref 70–99)
Glucose-Capillary: 129 mg/dL — ABNORMAL HIGH (ref 70–99)
Glucose-Capillary: 146 mg/dL — ABNORMAL HIGH (ref 70–99)
Glucose-Capillary: 208 mg/dL — ABNORMAL HIGH (ref 70–99)
Glucose-Capillary: 224 mg/dL — ABNORMAL HIGH (ref 70–99)

## 2010-06-26 LAB — BASIC METABOLIC PANEL
BUN: 10 mg/dL (ref 6–23)
CO2: 29 mEq/L (ref 19–32)
Calcium: 10.8 mg/dL — ABNORMAL HIGH (ref 8.4–10.5)
Chloride: 103 mEq/L (ref 96–112)
Creatinine, Ser: 0.99 mg/dL (ref 0.4–1.2)
GFR calc Af Amer: >60 mL/min (ref >60)
GFR calc non Af Amer: 56 mL/min — ABNORMAL LOW (ref >60)
Glucose, Bld: 227 mg/dL — ABNORMAL HIGH (ref 70–99)
Potassium: 4.6 mEq/L (ref 3.5–5.1)
Sodium: 137 mEq/L (ref 135–145)

## 2010-06-28 LAB — GLUCOSE, CAPILLARY
Glucose-Capillary: 109 mg/dL — ABNORMAL HIGH (ref 70–99)
Glucose-Capillary: 113 mg/dL — ABNORMAL HIGH (ref 70–99)
Glucose-Capillary: 115 mg/dL — ABNORMAL HIGH (ref 70–99)
Glucose-Capillary: 120 mg/dL — ABNORMAL HIGH (ref 70–99)
Glucose-Capillary: 121 mg/dL — ABNORMAL HIGH (ref 70–99)
Glucose-Capillary: 122 mg/dL — ABNORMAL HIGH (ref 70–99)
Glucose-Capillary: 122 mg/dL — ABNORMAL HIGH (ref 70–99)
Glucose-Capillary: 125 mg/dL — ABNORMAL HIGH (ref 70–99)
Glucose-Capillary: 131 mg/dL — ABNORMAL HIGH (ref 70–99)
Glucose-Capillary: 132 mg/dL — ABNORMAL HIGH (ref 70–99)
Glucose-Capillary: 132 mg/dL — ABNORMAL HIGH (ref 70–99)
Glucose-Capillary: 132 mg/dL — ABNORMAL HIGH (ref 70–99)
Glucose-Capillary: 137 mg/dL — ABNORMAL HIGH (ref 70–99)
Glucose-Capillary: 138 mg/dL — ABNORMAL HIGH (ref 70–99)
Glucose-Capillary: 138 mg/dL — ABNORMAL HIGH (ref 70–99)
Glucose-Capillary: 139 mg/dL — ABNORMAL HIGH (ref 70–99)
Glucose-Capillary: 145 mg/dL — ABNORMAL HIGH (ref 70–99)
Glucose-Capillary: 147 mg/dL — ABNORMAL HIGH (ref 70–99)
Glucose-Capillary: 147 mg/dL — ABNORMAL HIGH (ref 70–99)
Glucose-Capillary: 151 mg/dL — ABNORMAL HIGH (ref 70–99)
Glucose-Capillary: 152 mg/dL — ABNORMAL HIGH (ref 70–99)
Glucose-Capillary: 153 mg/dL — ABNORMAL HIGH (ref 70–99)
Glucose-Capillary: 156 mg/dL — ABNORMAL HIGH (ref 70–99)
Glucose-Capillary: 165 mg/dL — ABNORMAL HIGH (ref 70–99)
Glucose-Capillary: 168 mg/dL — ABNORMAL HIGH (ref 70–99)
Glucose-Capillary: 173 mg/dL — ABNORMAL HIGH (ref 70–99)
Glucose-Capillary: 177 mg/dL — ABNORMAL HIGH (ref 70–99)
Glucose-Capillary: 180 mg/dL — ABNORMAL HIGH (ref 70–99)
Glucose-Capillary: 182 mg/dL — ABNORMAL HIGH (ref 70–99)
Glucose-Capillary: 186 mg/dL — ABNORMAL HIGH (ref 70–99)
Glucose-Capillary: 190 mg/dL — ABNORMAL HIGH (ref 70–99)
Glucose-Capillary: 198 mg/dL — ABNORMAL HIGH (ref 70–99)
Glucose-Capillary: 200 mg/dL — ABNORMAL HIGH (ref 70–99)
Glucose-Capillary: 204 mg/dL — ABNORMAL HIGH (ref 70–99)
Glucose-Capillary: 204 mg/dL — ABNORMAL HIGH (ref 70–99)
Glucose-Capillary: 205 mg/dL — ABNORMAL HIGH (ref 70–99)
Glucose-Capillary: 214 mg/dL — ABNORMAL HIGH (ref 70–99)
Glucose-Capillary: 220 mg/dL — ABNORMAL HIGH (ref 70–99)
Glucose-Capillary: 224 mg/dL — ABNORMAL HIGH (ref 70–99)
Glucose-Capillary: 226 mg/dL — ABNORMAL HIGH (ref 70–99)
Glucose-Capillary: 234 mg/dL — ABNORMAL HIGH (ref 70–99)
Glucose-Capillary: 236 mg/dL — ABNORMAL HIGH (ref 70–99)
Glucose-Capillary: 241 mg/dL — ABNORMAL HIGH (ref 70–99)
Glucose-Capillary: 247 mg/dL — ABNORMAL HIGH (ref 70–99)
Glucose-Capillary: 250 mg/dL — ABNORMAL HIGH (ref 70–99)
Glucose-Capillary: 250 mg/dL — ABNORMAL HIGH (ref 70–99)
Glucose-Capillary: 251 mg/dL — ABNORMAL HIGH (ref 70–99)
Glucose-Capillary: 258 mg/dL — ABNORMAL HIGH (ref 70–99)
Glucose-Capillary: 285 mg/dL — ABNORMAL HIGH (ref 70–99)
Glucose-Capillary: 293 mg/dL — ABNORMAL HIGH (ref 70–99)
Glucose-Capillary: 93 mg/dL (ref 70–99)
Glucose-Capillary: 96 mg/dL (ref 70–99)
Glucose-Capillary: 107 mg/dL — ABNORMAL HIGH (ref 70–99)
Glucose-Capillary: 108 mg/dL — ABNORMAL HIGH (ref 70–99)
Glucose-Capillary: 118 mg/dL — ABNORMAL HIGH (ref 70–99)
Glucose-Capillary: 128 mg/dL — ABNORMAL HIGH (ref 70–99)
Glucose-Capillary: 130 mg/dL — ABNORMAL HIGH (ref 70–99)
Glucose-Capillary: 135 mg/dL — ABNORMAL HIGH (ref 70–99)
Glucose-Capillary: 143 mg/dL — ABNORMAL HIGH (ref 70–99)
Glucose-Capillary: 150 mg/dL — ABNORMAL HIGH (ref 70–99)
Glucose-Capillary: 152 mg/dL — ABNORMAL HIGH (ref 70–99)
Glucose-Capillary: 155 mg/dL — ABNORMAL HIGH (ref 70–99)
Glucose-Capillary: 158 mg/dL — ABNORMAL HIGH (ref 70–99)
Glucose-Capillary: 164 mg/dL — ABNORMAL HIGH (ref 70–99)
Glucose-Capillary: 177 mg/dL — ABNORMAL HIGH (ref 70–99)
Glucose-Capillary: 194 mg/dL — ABNORMAL HIGH (ref 70–99)
Glucose-Capillary: 197 mg/dL — ABNORMAL HIGH (ref 70–99)
Glucose-Capillary: 202 mg/dL — ABNORMAL HIGH (ref 70–99)
Glucose-Capillary: 207 mg/dL — ABNORMAL HIGH (ref 70–99)
Glucose-Capillary: 214 mg/dL — ABNORMAL HIGH (ref 70–99)
Glucose-Capillary: 223 mg/dL — ABNORMAL HIGH (ref 70–99)
Glucose-Capillary: 235 mg/dL — ABNORMAL HIGH (ref 70–99)
Glucose-Capillary: 250 mg/dL — ABNORMAL HIGH (ref 70–99)

## 2010-06-28 LAB — PHOSPHORUS
Phosphorus: 2.8 mg/dL (ref 2.3–4.6)
Phosphorus: 3.8 mg/dL (ref 2.3–4.6)
Phosphorus: 3.8 mg/dL (ref 2.3–4.6)

## 2010-06-28 LAB — CBC
HCT: 28.5 % — ABNORMAL LOW (ref 36.0–46.0)
HCT: 29.2 % — ABNORMAL LOW (ref 36.0–46.0)
HCT: 29.6 % — ABNORMAL LOW (ref 36.0–46.0)
HCT: 29.7 % — ABNORMAL LOW (ref 36.0–46.0)
HCT: 30.1 % — ABNORMAL LOW (ref 36.0–46.0)
HCT: 33.2 % — ABNORMAL LOW (ref 36.0–46.0)
Hemoglobin: 9.5 g/dL — ABNORMAL LOW (ref 12.0–15.0)
Hemoglobin: 9.8 g/dL — ABNORMAL LOW (ref 12.0–15.0)
Hemoglobin: 10.1 g/dL — ABNORMAL LOW (ref 12.0–15.0)
Hemoglobin: 10.8 g/dL — ABNORMAL LOW (ref 12.0–15.0)
Hemoglobin: 9.7 g/dL — ABNORMAL LOW (ref 12.0–15.0)
Hemoglobin: 9.8 g/dL — ABNORMAL LOW (ref 12.0–15.0)
MCHC: 33.3 g/dL (ref 30.0–36.0)
MCHC: 33.4 g/dL (ref 30.0–36.0)
MCHC: 32.6 g/dL (ref 30.0–36.0)
MCHC: 32.7 g/dL (ref 30.0–36.0)
MCHC: 32.9 g/dL (ref 30.0–36.0)
MCHC: 33.4 g/dL (ref 30.0–36.0)
MCV: 83.1 fL (ref 78.0–100.0)
MCV: 83.7 fL (ref 78.0–100.0)
MCV: 84 fL (ref 78.0–100.0)
MCV: 84.1 fL (ref 78.0–100.0)
MCV: 84.7 fL (ref 78.0–100.0)
MCV: 85 fL (ref 78.0–100.0)
Platelets: 187 10*3/uL (ref 150–400)
Platelets: 278 10*3/uL (ref 150–400)
Platelets: 204 10*3/uL (ref 150–400)
Platelets: 205 10*3/uL (ref 150–400)
Platelets: 232 10*3/uL (ref 150–400)
Platelets: 234 10*3/uL (ref 150–400)
RBC: 3.43 MIL/uL — ABNORMAL LOW (ref 3.87–5.11)
RBC: 3.49 MIL/uL — ABNORMAL LOW (ref 3.87–5.11)
RBC: 3.49 MIL/uL — ABNORMAL LOW (ref 3.87–5.11)
RBC: 3.53 MIL/uL — ABNORMAL LOW (ref 3.87–5.11)
RBC: 3.59 MIL/uL — ABNORMAL LOW (ref 3.87–5.11)
RBC: 3.9 MIL/uL (ref 3.87–5.11)
RDW: 13.8 % (ref 11.5–15.5)
RDW: 14.6 % (ref 11.5–15.5)
RDW: 14.7 % (ref 11.5–15.5)
RDW: 14.7 % (ref 11.5–15.5)
RDW: 14.7 % (ref 11.5–15.5)
RDW: 15.3 % (ref 11.5–15.5)
WBC: 6.2 10*3/uL (ref 4.0–10.5)
WBC: 6.5 10*3/uL (ref 4.0–10.5)
WBC: 10 10*3/uL (ref 4.0–10.5)
WBC: 4.9 10*3/uL (ref 4.0–10.5)
WBC: 5.4 10*3/uL (ref 4.0–10.5)
WBC: 5.9 10*3/uL (ref 4.0–10.5)

## 2010-06-28 LAB — BASIC METABOLIC PANEL
BUN: 12 mg/dL (ref 6–23)
BUN: 10 mg/dL (ref 6–23)
BUN: 11 mg/dL (ref 6–23)
BUN: 12 mg/dL (ref 6–23)
BUN: 12 mg/dL (ref 6–23)
BUN: 14 mg/dL (ref 6–23)
CO2: 25 mEq/L (ref 19–32)
CO2: 24 mEq/L (ref 19–32)
CO2: 26 mEq/L (ref 19–32)
CO2: 26 mEq/L (ref 19–32)
CO2: 28 mEq/L (ref 19–32)
CO2: 30 mEq/L (ref 19–32)
Calcium: 8.2 mg/dL — ABNORMAL LOW (ref 8.4–10.5)
Calcium: 8.4 mg/dL (ref 8.4–10.5)
Calcium: 8.5 mg/dL (ref 8.4–10.5)
Calcium: 9.2 mg/dL (ref 8.4–10.5)
Calcium: 9.3 mg/dL (ref 8.4–10.5)
Calcium: 9.6 mg/dL (ref 8.4–10.5)
Chloride: 99 mEq/L (ref 96–112)
Chloride: 102 mEq/L (ref 96–112)
Chloride: 103 mEq/L (ref 96–112)
Chloride: 103 mEq/L (ref 96–112)
Chloride: 106 mEq/L (ref 96–112)
Chloride: 110 mEq/L (ref 96–112)
Creatinine, Ser: 0.87 mg/dL (ref 0.4–1.2)
Creatinine, Ser: 1 mg/dL (ref 0.4–1.2)
Creatinine, Ser: 1.01 mg/dL (ref 0.4–1.2)
Creatinine, Ser: 1.02 mg/dL (ref 0.4–1.2)
Creatinine, Ser: 1.18 mg/dL (ref 0.4–1.2)
Creatinine, Ser: 1.2 mg/dL (ref 0.4–1.2)
GFR calc Af Amer: >60 mL/min (ref >60)
GFR calc Af Amer: 55 mL/min — ABNORMAL LOW (ref >60)
GFR calc Af Amer: 56 mL/min — ABNORMAL LOW (ref >60)
GFR calc Af Amer: >60 mL/min (ref >60)
GFR calc Af Amer: >60 mL/min (ref >60)
GFR calc Af Amer: >60 mL/min (ref >60)
GFR calc non Af Amer: >60 mL/min (ref >60)
GFR calc non Af Amer: 45 mL/min — ABNORMAL LOW (ref >60)
GFR calc non Af Amer: 46 mL/min — ABNORMAL LOW (ref >60)
GFR calc non Af Amer: 54 mL/min — ABNORMAL LOW (ref >60)
GFR calc non Af Amer: 55 mL/min — ABNORMAL LOW (ref >60)
GFR calc non Af Amer: 56 mL/min — ABNORMAL LOW (ref >60)
Glucose, Bld: 164 mg/dL — ABNORMAL HIGH (ref 70–99)
Glucose, Bld: 131 mg/dL — ABNORMAL HIGH (ref 70–99)
Glucose, Bld: 161 mg/dL — ABNORMAL HIGH (ref 70–99)
Glucose, Bld: 179 mg/dL — ABNORMAL HIGH (ref 70–99)
Glucose, Bld: 187 mg/dL — ABNORMAL HIGH (ref 70–99)
Glucose, Bld: 196 mg/dL — ABNORMAL HIGH (ref 70–99)
Potassium: 3.9 mEq/L (ref 3.5–5.1)
Potassium: 3.6 mEq/L (ref 3.5–5.1)
Potassium: 4.2 mEq/L (ref 3.5–5.1)
Potassium: 4.2 mEq/L (ref 3.5–5.1)
Potassium: 4.4 mEq/L (ref 3.5–5.1)
Potassium: 4.6 mEq/L (ref 3.5–5.1)
Sodium: 132 mEq/L — ABNORMAL LOW (ref 135–145)
Sodium: 136 mEq/L (ref 135–145)
Sodium: 136 mEq/L (ref 135–145)
Sodium: 138 mEq/L (ref 135–145)
Sodium: 140 mEq/L (ref 135–145)
Sodium: 141 mEq/L (ref 135–145)

## 2010-06-28 LAB — COMPREHENSIVE METABOLIC PANEL
ALT: 33 U/L (ref 0–35)
ALT: 58 U/L — ABNORMAL HIGH (ref 0–35)
AST: 19 U/L (ref 0–37)
AST: 57 U/L — ABNORMAL HIGH (ref 0–37)
Albumin: 2.7 g/dL — ABNORMAL LOW (ref 3.5–5.2)
Albumin: 2.7 g/dL — ABNORMAL LOW (ref 3.5–5.2)
Alkaline Phosphatase: 141 U/L — ABNORMAL HIGH (ref 39–117)
Alkaline Phosphatase: 144 U/L — ABNORMAL HIGH (ref 39–117)
BUN: 14 mg/dL (ref 6–23)
BUN: 14 mg/dL (ref 6–23)
CO2: 23 mEq/L (ref 19–32)
CO2: 24 mEq/L (ref 19–32)
Calcium: 9.4 mg/dL (ref 8.4–10.5)
Calcium: 9.4 mg/dL (ref 8.4–10.5)
Chloride: 104 mEq/L (ref 96–112)
Chloride: 107 mEq/L (ref 96–112)
Creatinine, Ser: 0.96 mg/dL (ref 0.4–1.2)
Creatinine, Ser: 1.05 mg/dL (ref 0.4–1.2)
GFR calc Af Amer: >60 mL/min (ref >60)
GFR calc Af Amer: >60 mL/min (ref >60)
GFR calc non Af Amer: 53 mL/min — ABNORMAL LOW (ref >60)
GFR calc non Af Amer: 58 mL/min — ABNORMAL LOW (ref >60)
Glucose, Bld: 151 mg/dL — ABNORMAL HIGH (ref 70–99)
Glucose, Bld: 151 mg/dL — ABNORMAL HIGH (ref 70–99)
Potassium: 5.1 mEq/L (ref 3.5–5.1)
Potassium: 5.2 mEq/L — ABNORMAL HIGH (ref 3.5–5.1)
Sodium: 137 mEq/L (ref 135–145)
Sodium: 138 mEq/L (ref 135–145)
Total Bilirubin: 0.4 mg/dL (ref 0.3–1.2)
Total Bilirubin: 0.4 mg/dL (ref 0.3–1.2)
Total Protein: 4.9 g/dL — ABNORMAL LOW (ref 6.0–8.3)
Total Protein: 5.1 g/dL — ABNORMAL LOW (ref 6.0–8.3)

## 2010-06-28 LAB — DIFFERENTIAL
Basophils Absolute: 0 10*3/uL (ref 0.0–0.1)
Basophils Absolute: 0 10*3/uL (ref 0.0–0.1)
Basophils Relative: 0 % (ref 0–1)
Basophils Relative: 0 % (ref 0–1)
Eosinophils Absolute: 0.5 10*3/uL (ref 0.0–0.7)
Eosinophils Absolute: 0.3 10*3/uL (ref 0.0–0.7)
Eosinophils Relative: 8 % — ABNORMAL HIGH (ref 0–5)
Eosinophils Relative: 3 % (ref 0–5)
Lymphocytes Relative: 23 % (ref 12–46)
Lymphocytes Relative: 10 % — ABNORMAL LOW (ref 12–46)
Lymphs Abs: 1.4 10*3/uL (ref 0.7–4.0)
Lymphs Abs: 1 10*3/uL (ref 0.7–4.0)
Monocytes Absolute: 0.4 10*3/uL (ref 0.1–1.0)
Monocytes Absolute: 0.2 10*3/uL (ref 0.1–1.0)
Monocytes Relative: 7 % (ref 3–12)
Monocytes Relative: 2 % — ABNORMAL LOW (ref 3–12)
Neutro Abs: 3.9 10*3/uL (ref 1.7–7.7)
Neutro Abs: 8.4 10*3/uL — ABNORMAL HIGH (ref 1.7–7.7)
Neutrophils Relative %: 63 % (ref 43–77)
Neutrophils Relative %: 84 % — ABNORMAL HIGH (ref 43–77)

## 2010-06-28 LAB — TSH: TSH: 3.185 u[IU]/mL (ref 0.350–4.500)

## 2010-06-28 LAB — URINE CULTURE: Colony Count: >=100000

## 2010-06-28 LAB — URINALYSIS, ROUTINE W REFLEX MICROSCOPIC
Bilirubin Urine: NEGATIVE
Glucose, UA: NEGATIVE mg/dL
Hgb urine dipstick: NEGATIVE
Ketones, ur: NEGATIVE mg/dL
Nitrite: POSITIVE — AB
Protein, ur: NEGATIVE mg/dL
Specific Gravity, Urine: 1.013 (ref 1.005–1.030)
Urobilinogen, UA: 0.2 mg/dL (ref 0.0–1.0)
pH: 5.5 (ref 5.0–8.0)

## 2010-06-28 LAB — URINE MICROSCOPIC-ADD ON

## 2010-06-28 LAB — PROTIME-INR
INR: 1.04 (ref 0.00–1.49)
Prothrombin Time: 13.5 seconds (ref 11.6–15.2)

## 2010-06-28 LAB — MAGNESIUM
Magnesium: 1.1 mg/dL — ABNORMAL LOW (ref 1.5–2.5)
Magnesium: 1.6 mg/dL (ref 1.5–2.5)
Magnesium: 2.1 mg/dL (ref 1.5–2.5)

## 2010-06-28 LAB — CARDIAC PANEL(CRET KIN+CKTOT+MB+TROPI)
CK, MB: 3 ng/mL (ref 0.3–4.0)
CK, MB: 9.4 ng/mL — ABNORMAL HIGH (ref 0.3–4.0)
Relative Index: 5.2 — ABNORMAL HIGH (ref 0.0–2.5)
Relative Index: INVALID (ref 0.0–2.5)
Total CK: 181 U/L — ABNORMAL HIGH (ref 7–177)
Total CK: 96 U/L (ref 7–177)
Troponin I: 0.11 ng/mL — ABNORMAL HIGH (ref 0.00–0.06)
Troponin I: 0.26 ng/mL — ABNORMAL HIGH (ref 0.00–0.06)

## 2010-06-28 LAB — HEPATIC FUNCTION PANEL
ALT: 21 U/L (ref 0–35)
AST: 19 U/L (ref 0–37)
Albumin: 3.7 g/dL (ref 3.5–5.2)
Alkaline Phosphatase: 77 U/L (ref 39–117)
Bilirubin, Direct: 0.2 mg/dL (ref 0.0–0.3)
Indirect Bilirubin: 0.3 mg/dL (ref 0.3–0.9)
Total Bilirubin: 0.5 mg/dL (ref 0.3–1.2)
Total Protein: 6.1 g/dL (ref 6.0–8.3)

## 2010-06-28 LAB — APTT
aPTT: 26 seconds (ref 24–37)
aPTT: 30 seconds (ref 24–37)

## 2010-06-28 LAB — HEMOGLOBIN A1C
Hgb A1c MFr Bld: 6.2 % — ABNORMAL HIGH (ref 4.6–6.1)
Mean Plasma Glucose: 131 mg/dL

## 2010-06-28 LAB — BRAIN NATRIURETIC PEPTIDE
Pro B Natriuretic peptide (BNP): 156 pg/mL — ABNORMAL HIGH (ref 0.0–100.0)
Pro B Natriuretic peptide (BNP): 439 pg/mL — ABNORMAL HIGH (ref 0.0–100.0)

## 2010-08-01 ENCOUNTER — Encounter (INDEPENDENT_AMBULATORY_CARE_PROVIDER_SITE_OTHER): Payer: Self-pay | Admitting: General Surgery

## 2010-08-01 DIAGNOSIS — Z803 Family history of malignant neoplasm of breast: Secondary | ICD-10-CM | POA: Insufficient documentation

## 2010-08-02 DIAGNOSIS — D638 Anemia in other chronic diseases classified elsewhere: Secondary | ICD-10-CM | POA: Insufficient documentation

## 2010-08-10 NOTE — Op Note (Signed)
NAME:  Abigail Hoover, Abigail Hoover                         ACCOUNT NO.:  [id number]   MEDICAL RECORD NO.:  [id number]                   PATIENT TYPE:  AMB   LOCATION:  ENDO                                 FACILITY:  [facility]   PHYSICIAN:  Nelwyn Salisbury, M.D.               DATE OF BIRTH:  09/22/1945   DATE OF PROCEDURE:  07/23/2002  DATE OF DISCHARGE:                                 OPERATIVE REPORT   PROCEDURE:  Screening colonoscopy.   ENDOSCOPIST:  Nelwyn Salisbury, M.D.   INSTRUMENT USED:  Olympus video colonoscope (adjustable pediatric scope).   INDICATION FOR PROCEDURE:  Iron-deficiency anemia in a 65 year old Asian  [place] female.  Rule out colonic polyps, masses, etc. The patient had an  endoscopy in the recent past that showed no abnormalities or source of GI  blood loss.  The patient has had a history of iron-deficiency anemia in the  past and had discontinued her iron supplement on her own.   PREPROCEDURE PREPARATION:  Informed consent was procured from the patient.  The patient fasted for eight hours prior to the procedure and prepped with a  bottle of magnesium citrate and a gallon of GoLYTELY the night prior to the  procedure.   PREPROCEDURE PHYSICAL:  VITAL SIGNS:  The patient had stable vital signs.  NECK:  Supple.  CHEST:  Clear to auscultation.  S1, S2 regular.  ABDOMEN:  Soft with normal bowel sounds.   DESCRIPTION OF PROCEDURE:  The patient was placed in the left lateral  decubitus position and sedated with 100 mg of Demerol and 10 mg of Versed  intravenously.  Once the patient was adequately sedate and maintained on low-  flow oxygen and continuous cardiac monitoring, the Olympus video colonoscope  was advanced from the rectum to the cecum without difficulty.  No masses,  polyps, erosions, ulcerations, or diverticula were seen.  Small internal  hemorrhoids were seen on retroflexion in the rectum.  Small external  hemorrhoids were seen on anal inspection.  No masses,  polyps, erosions,  etc., were present.   IMPRESSION:  1. Normal colonoscopy up to the terminal ileum except for small internal     hemorrhoids seen on retroflexion.  2. No masses or polyps seen.    RECOMMENDATIONS:  1. Continue iron supplements as advised.  2. Outpatient follow-up for repeat CBC.  Further recommendations made     thereafter.                                               Nelwyn Salisbury, M.D.    JNM/MEDQ  D:  07/23/2002  T:  07/24/2002  Job:  [id number]   cc:   Harden Mo, M.D.  [address]  [place]  [place] [address]  Fax: [phone]

## 2010-08-10 NOTE — Op Note (Signed)
   NAME:  Abigail Hoover, ROHLFING                         ACCOUNT NO.:  [id number]   MEDICAL RECORD NO.:  [id number]                   PATIENT TYPE:  AMB   LOCATION:  ENDO                                 FACILITY:  [facility]   PHYSICIAN:  Nelwyn Salisbury, M.D.               DATE OF BIRTH:  06/08/1945   DATE OF PROCEDURE:  03/23/2002  DATE OF DISCHARGE:                                 OPERATIVE REPORT   PROCEDURE:  Esophagogastroduodenoscopy.   ENDOSCOPIST:  Nelwyn Salisbury, M.D.   INSTRUMENT USED:  Olympus video panendoscope.   INDICATION FOR PROCEDURE:  A 65 year old [place] female with a history  of epigastric pain, recurrent nausea, and severe regurgitation of acid  reflux in spite of b.i.d. Nexium used.  Rule out ulcer disease.   PREPROCEDURE PREPARATION:  Informed consent was procured from the patient.  The patient fasted for eight hours prior to the procedure.   PREPROCEDURE PHYSICAL:  VITAL SIGNS:  The patient had stable vital signs.  NECK:  Supple.  CHEST:  Clear to auscultation.  S1, S2 regular.  ABDOMEN:  Soft with normal bowel sounds.   DESCRIPTION OF PROCEDURE:  The patient was placed in the left lateral  decubitus position and sedated with 50 mg of Demerol and 5 mg of Versed  intravenously.  Once the patient was adequately sedate and maintained on low-  flow oxygen and continuous cardiac monitoring, the Olympus video  panendoscope was advanced through the mouthpiece, over the tongue, into the  esophagus under direct vision.  The entire esophagus appeared normal with no  evidence of ring, stricture, masses, esophagitis, or Barrett's mucosa.  The  scope was then advanced into the stomach.  The entire gastric mucosa and the  proximal small bowel appeared normal.   IMPRESSION:  Normal EGD.   RECOMMENDATIONS:  1. Continue PPIs for now.  2.     Try Protonix instead of Nexium to see if this works better.  3. Twenty-four hour pH study if symptoms persist.  4. Outpatient  follow-up within the next two weeks or earlier if need be.                                               Nelwyn Salisbury, M.D.    JNM/MEDQ  D:  03/23/2002  T:  03/23/2002  Job:  [id number]   cc:   Harden Mo, M.D.  [address]  [place]  [place] [address]  Fax: [phone]

## 2010-08-10 NOTE — Op Note (Signed)
NAME:  Abigail, Hoover NO.:  [id number]   MEDICAL RECORD NO.:  [id number]                   PATIENT TYPE:   LOCATION:                                       FACILITY:   PHYSICIAN:  Domingo Pulse, M.D.               DATE OF BIRTH:   DATE OF PROCEDURE:  03/30/2002  DATE OF DISCHARGE:                                 OPERATIVE REPORT   PREOPERATIVE DIAGNOSIS:  Irritative voiding symptoms, rule out interstitial  cystitis.   POSTOPERATIVE DIAGNOSIS:  Irritative voiding symptoms, rule out interstitial  cystitis.   PROCEDURE:  Cystoscopy with urethral calibration, hydrodistention of  bladder, bladder biopsy with cauterization, Marcaine and Pyridium  instillation, Marcaine and Kenalog injection.   SURGEON:  Domingo Pulse, M.D.   ANESTHESIA:  General.   COMPLICATIONS:  None.   DRAINS:  None.   BRIEF HISTORY:  This 65 year old female has had longstanding problems with  irritative voiding symptoms, with marked urgency, frequency and nocturia.  The patient received antibiotics on multiple occasions, with no improvement.  She is now to undergo a diagnostic cystoscopy.  The patient had previously  undergone evaluation by Dr. Lillette Boxer. Dahlstedt; it was thought that her  dysuria was possibly caused by her diabetes.  Dr. Jake Michaelis appropriately  thought that she might have interstitial cystitis and has referred her for  further evaluation.  The patient has undergone ultrasound, which showed  fatty liver; but, otherwise unremarkable ultrasound.  CT scan showed a  possible noncalcified 1-cm gallstone, but was otherwise unremarkable.  The  patient understands the risks and benefits of procedure and gave full and  informed consent.   PROCEDURE:  After a successful induction of general anesthesia, the patient  was placed in the dorsal lithotomy position; prepped with Betadine and  draped in the usual sterile fashion.  Careful bimanual examination revealed  no palpable lesions of the urethra.  There was essentially no sign of a mass  or urethral diverticulum.  There was no urethral discharge or vaginal  discharge.  There was no significant cystocele, rectocele or enterocele.  The patient's urethra was a little tight at about 28-French, but it was  successfully dilated at 32-French.   The cystoscope was inserted.  The bladder was carefully inspected; it was  free of any tumor or stones.  Both ureteral orifices were normal in  configuration and location.  The urine __________was clear.  The bladder was  then distended at a pressure of 100 cm of water for five minutes.  The  bladder capacity was felt to be small; when the bladder was drained the  capacity was found to be approximately 650 cc.  This would compare to a  normal capacity of 1100 cc and an interstitial cystitis capacity of 575 cc.   The bladder during the distention phase was felt to be very thin and it was  thought that the  patient would have significant glomerulation, but  surprisingly the glomerulations were relatively modest; with a few small  scattered glomerulations across the bottom.  Much of the bladder looked  reasonably normal.  This certainly does not confirm interstitial cystitis,  but the diminished bladder capacity is of concern.   A bladder biopsy was taken from mast cell analysis and biopsy cut was  cauterized.  A mixture of Marcaine and Pyridium was left in the bladder;  Marcaine and Kenalog were injected periurethrally.   The patient tolerated the procedure well and was taken to the recovery room  in good condition.  She will be sent home with Levaquin for three days for  antibiotic coverage.  She will be given UroMax for symptomatic relief, and  will be given Darvocet-N 100 for any postoperative pain.   If the biopsy shows a significant number of mast cells and/or the patient  has a marked improvement in her symptoms from the hydrodistention, this  would  suggest interstitial cystitis.  She will be treated appropriately with  either an oral-based protocol consisting of Elmiron, Atarax and possibly a  low-dose antidepressant or instillation therapies.  If the patient does not  respond to the hydrodistention and the mast cell biopsy is unremarkable,  then certainly this would suggest interstitial cystitis as the less likely  diagnosis and empiric therapy with anticholinergics and urinary analgesics  such as UroMax or Pyridium plus will have to be considered.                                               Domingo Pulse, M.D.    RJE/MEDQ  D:  03/30/2002  T:  03/30/2002  Job:  [id number]   cc:   Harden Mo, M.D.  [address]  [place]  [place] [address]  Fax: [phone]

## 2010-08-10 NOTE — Op Note (Signed)
[place]  Patient:    Abigail Hoover, Abigail Hoover                      MRN: [id number] Proc. Date: 08/31/99 Adm. Date:  [id number] Disc. Date: [id number] Attending:  Erick Blinks CC:         Nelwyn Salisbury, M.D.                           Operative Report  PREOPERATIVE DIAGNOSIS:  Palpable left breast mass.  POSTOPERATIVE DIAGNOSIS:  Palpable left breast mass.  PROCEDURE:  Excisional biopsy of left breast mass.  SURGEON:  Odis Hollingshead, M.D.  ANESTHESIA:  Local (1% lidocaine with epinephrine plus 0.5% plain Marcaine plus sodium bicarbonate) with MAC.  INDICATIONS:  This 65 year old female has a sister with breast cancer and also is nulliparous.  She presented for an examination of the breast and was noted to have a 1 cm lesion at the 6 oclock position.  Ultrasound of the lesion in the office demonstrated that it was solid.  FNA demonstrated a suggestion of some atypical cells.  She presents now for excisional biopsy.  INFORMED CONSENT:  The risks including, but not limited to, bleeding were explained to her.  DESCRIPTION OF PROCEDURE:  She was placed supine on operating table and intravenous sedation was given.  The left breast was sterilely prepped and draped.  I palpated the lesion at the 6 oclock position.  Local anesthetic was infiltrated in the circumareolar area from the 4 oclock to the 8 oclock position, and a circumareolar incision was made incising the skin and subcutaneous tissue sharply.  The mass was palpated, grasped with Allis forceps, and was excised.  Tissue deep to it was excised as well.  Both these specimens were sent fresh to pathology.  Next, the biopsy bed was examined, and hemostasis was obtained using electrocautery.  Once hemostasis was adequate, the subcutaneous tissue was loosely approximately with interrupted 3-0 Vicryl sutures, and the skin was closed with a 4-0 Monocryl subcuticular stitch, followed by  Steri-Strips and a sterile dressing.  She tolerated the procedure well without any apparent complications.  She was taken to the recovery room in satisfactory condition. DD:  08/31/99 TD:  09/04/99 Job: [id number]

## 2010-08-14 ENCOUNTER — Other Ambulatory Visit: Payer: Self-pay | Admitting: General Surgery

## 2010-08-14 DIAGNOSIS — Z1231 Encounter for screening mammogram for malignant neoplasm of breast: Secondary | ICD-10-CM

## 2010-08-23 ENCOUNTER — Ambulatory Visit
Admission: RE | Admit: 2010-08-23 | Discharge: 2010-08-23 | Disposition: A | Discharge status: Home / Self Care | Payer: Medicare Other | Source: Ambulatory Visit | Attending: General Surgery | Admitting: General Surgery

## 2010-08-23 ENCOUNTER — Ambulatory Visit: Payer: Medicare Other

## 2010-08-23 DIAGNOSIS — Z1231 Encounter for screening mammogram for malignant neoplasm of breast: Secondary | ICD-10-CM

## 2010-09-05 ENCOUNTER — Other Ambulatory Visit: Payer: Self-pay | Admitting: Hematology and Oncology

## 2010-09-05 ENCOUNTER — Encounter (HOSPITAL_BASED_OUTPATIENT_CLINIC_OR_DEPARTMENT_OTHER): Payer: Medicare Other | Admitting: Hematology and Oncology

## 2010-09-05 DIAGNOSIS — D638 Anemia in other chronic diseases classified elsewhere: Secondary | ICD-10-CM

## 2010-09-05 DIAGNOSIS — D649 Anemia, unspecified: Secondary | ICD-10-CM

## 2010-09-05 LAB — COMPREHENSIVE METABOLIC PANEL
ALT: 15 U/L (ref 0–35)
AST: 19 U/L (ref 0–37)
Albumin: 4 g/dL (ref 3.5–5.2)
Alkaline Phosphatase: 76 U/L (ref 39–117)
BUN: 18 mg/dL (ref 6–23)
CO2: 23 mEq/L (ref 19–32)
Calcium: 8.8 mg/dL (ref 8.4–10.5)
Chloride: 104 mEq/L (ref 96–112)
Creatinine, Ser: 1.18 mg/dL — ABNORMAL HIGH (ref 0.50–1.10)
Glucose, Bld: 119 mg/dL — ABNORMAL HIGH (ref 70–99)
Potassium: 4.8 mEq/L (ref 3.5–5.3)
Sodium: 140 mEq/L (ref 135–145)
Total Bilirubin: 0.4 mg/dL (ref 0.3–1.2)
Total Protein: 5.8 g/dL — ABNORMAL LOW (ref 6.0–8.3)

## 2010-09-05 LAB — CBC WITH DIFFERENTIAL/PLATELET
BASO%: 0.4 % (ref 0.0–2.0)
Basophils Absolute: 0 10*3/uL (ref 0.0–0.1)
EOS%: 7.3 % — ABNORMAL HIGH (ref 0.0–7.0)
Eosinophils Absolute: 0.3 10*3/uL (ref 0.0–0.5)
HCT: 27 % — ABNORMAL LOW (ref 34.8–46.6)
HGB: 8.8 g/dL — ABNORMAL LOW (ref 11.6–15.9)
LYMPH%: 29.7 % (ref 14.0–49.7)
MCH: 28.7 pg (ref 25.1–34.0)
MCHC: 32.6 g/dL (ref 31.5–36.0)
MCV: 87.8 fL (ref 79.5–101.0)
MONO#: 0.3 10*3/uL (ref 0.1–0.9)
MONO%: 6.9 % (ref 0.0–14.0)
NEUT#: 2.5 10*3/uL (ref 1.5–6.5)
NEUT%: 55.7 % (ref 38.4–76.8)
Platelets: 248 10*3/uL (ref 145–400)
RBC: 3.07 10*6/uL — ABNORMAL LOW (ref 3.70–5.45)
RDW: 13.9 % (ref 11.2–14.5)
WBC: 4.5 10*3/uL (ref 3.9–10.3)
lymph#: 1.3 10*3/uL (ref 0.9–3.3)

## 2010-09-27 ENCOUNTER — Other Ambulatory Visit: Payer: Self-pay | Admitting: Hematology and Oncology

## 2010-09-27 ENCOUNTER — Encounter (HOSPITAL_BASED_OUTPATIENT_CLINIC_OR_DEPARTMENT_OTHER): Payer: Medicare Other | Admitting: Hematology and Oncology

## 2010-09-27 DIAGNOSIS — D509 Iron deficiency anemia, unspecified: Secondary | ICD-10-CM

## 2010-09-27 LAB — CBC WITH DIFFERENTIAL/PLATELET
BASO%: 0.4 % (ref 0.0–2.0)
Basophils Absolute: 0 10*3/uL (ref 0.0–0.1)
EOS%: 6 % (ref 0.0–7.0)
Eosinophils Absolute: 0.3 10*3/uL (ref 0.0–0.5)
HCT: 35.8 % (ref 34.8–46.6)
HGB: 11.1 g/dL — ABNORMAL LOW (ref 11.6–15.9)
LYMPH%: 26 % (ref 14.0–49.7)
MCH: 27.5 pg (ref 25.1–34.0)
MCHC: 31 g/dL — ABNORMAL LOW (ref 31.5–36.0)
MCV: 88.8 fL (ref 79.5–101.0)
MONO#: 0.5 10*3/uL (ref 0.1–0.9)
MONO%: 9 % (ref 0.0–14.0)
NEUT#: 3.1 10*3/uL (ref 1.5–6.5)
NEUT%: 58.6 % (ref 38.4–76.8)
Platelets: 261 10*3/uL (ref 145–400)
RBC: 4.03 10*6/uL (ref 3.70–5.45)
RDW: 13.9 % (ref 11.2–14.5)
WBC: 5.3 10*3/uL (ref 3.9–10.3)
lymph#: 1.4 10*3/uL (ref 0.9–3.3)
nRBC: 0 % (ref 0–0)

## 2010-10-17 ENCOUNTER — Encounter (HOSPITAL_BASED_OUTPATIENT_CLINIC_OR_DEPARTMENT_OTHER): Payer: Medicare Other | Admitting: Hematology and Oncology

## 2010-10-17 ENCOUNTER — Other Ambulatory Visit: Payer: Self-pay | Admitting: Hematology and Oncology

## 2010-10-17 DIAGNOSIS — D509 Iron deficiency anemia, unspecified: Secondary | ICD-10-CM

## 2010-10-17 LAB — CBC WITH DIFFERENTIAL/PLATELET
BASO%: 0.3 % (ref 0.0–2.0)
Basophils Absolute: 0 10*3/uL (ref 0.0–0.1)
EOS%: 8.4 % — ABNORMAL HIGH (ref 0.0–7.0)
Eosinophils Absolute: 0.5 10*3/uL (ref 0.0–0.5)
HCT: 38 % (ref 34.8–46.6)
HGB: 11.6 g/dL (ref 11.6–15.9)
LYMPH%: 29.4 % (ref 14.0–49.7)
MCH: 26.5 pg (ref 25.1–34.0)
MCHC: 30.5 g/dL — ABNORMAL LOW (ref 31.5–36.0)
MCV: 86.8 fL (ref 79.5–101.0)
MONO#: 0.3 10*3/uL (ref 0.1–0.9)
MONO%: 5.1 % (ref 0.0–14.0)
NEUT#: 3.6 10*3/uL (ref 1.5–6.5)
NEUT%: 56.8 % (ref 38.4–76.8)
Platelets: 239 10*3/uL (ref 145–400)
RBC: 4.38 10*6/uL (ref 3.70–5.45)
RDW: 13.2 % (ref 11.2–14.5)
WBC: 6.3 10*3/uL (ref 3.9–10.3)
lymph#: 1.9 10*3/uL (ref 0.9–3.3)

## 2010-11-07 ENCOUNTER — Encounter (HOSPITAL_BASED_OUTPATIENT_CLINIC_OR_DEPARTMENT_OTHER): Payer: Medicare Other | Admitting: Hematology and Oncology

## 2010-11-07 ENCOUNTER — Other Ambulatory Visit: Payer: Self-pay | Admitting: Hematology and Oncology

## 2010-11-07 DIAGNOSIS — E119 Type 2 diabetes mellitus without complications: Secondary | ICD-10-CM

## 2010-11-07 DIAGNOSIS — D638 Anemia in other chronic diseases classified elsewhere: Secondary | ICD-10-CM

## 2010-11-07 DIAGNOSIS — I10 Essential (primary) hypertension: Secondary | ICD-10-CM

## 2010-11-07 DIAGNOSIS — D509 Iron deficiency anemia, unspecified: Secondary | ICD-10-CM

## 2010-11-07 LAB — CBC WITH DIFFERENTIAL/PLATELET
BASO%: 0.2 % (ref 0.0–2.0)
Basophils Absolute: 0 10*3/uL (ref 0.0–0.1)
EOS%: 7.2 % — ABNORMAL HIGH (ref 0.0–7.0)
Eosinophils Absolute: 0.4 10*3/uL (ref 0.0–0.5)
HCT: 32.8 % — ABNORMAL LOW (ref 34.8–46.6)
HGB: 10.6 g/dL — ABNORMAL LOW (ref 11.6–15.9)
LYMPH%: 29.8 % (ref 14.0–49.7)
MCH: 27.8 pg (ref 25.1–34.0)
MCHC: 32.5 g/dL (ref 31.5–36.0)
MCV: 85.7 fL (ref 79.5–101.0)
MONO#: 0.3 10*3/uL (ref 0.1–0.9)
MONO%: 4.9 % (ref 0.0–14.0)
NEUT#: 3.5 10*3/uL (ref 1.5–6.5)
NEUT%: 57.9 % (ref 38.4–76.8)
Platelets: 207 10*3/uL (ref 145–400)
RBC: 3.82 10*6/uL (ref 3.70–5.45)
RDW: 13.5 % (ref 11.2–14.5)
WBC: 6.1 10*3/uL (ref 3.9–10.3)
lymph#: 1.8 10*3/uL (ref 0.9–3.3)

## 2010-11-28 ENCOUNTER — Encounter (HOSPITAL_BASED_OUTPATIENT_CLINIC_OR_DEPARTMENT_OTHER): Payer: Medicare Other | Admitting: Hematology and Oncology

## 2010-11-28 ENCOUNTER — Other Ambulatory Visit: Payer: Self-pay | Admitting: Hematology and Oncology

## 2010-11-28 DIAGNOSIS — D509 Iron deficiency anemia, unspecified: Secondary | ICD-10-CM

## 2010-11-28 LAB — CBC WITH DIFFERENTIAL/PLATELET
BASO%: 0.3 % (ref 0.0–2.0)
Basophils Absolute: 0 10*3/uL (ref 0.0–0.1)
EOS%: 7.1 % — ABNORMAL HIGH (ref 0.0–7.0)
Eosinophils Absolute: 0.4 10*3/uL (ref 0.0–0.5)
HCT: 36.1 % (ref 34.8–46.6)
HGB: 11.5 g/dL — ABNORMAL LOW (ref 11.6–15.9)
LYMPH%: 23.2 % (ref 14.0–49.7)
MCH: 27.6 pg (ref 25.1–34.0)
MCHC: 32 g/dL (ref 31.5–36.0)
MCV: 86.2 fL (ref 79.5–101.0)
MONO#: 0.3 10*3/uL (ref 0.1–0.9)
MONO%: 6.4 % (ref 0.0–14.0)
NEUT#: 3.1 10*3/uL (ref 1.5–6.5)
NEUT%: 63 % (ref 38.4–76.8)
Platelets: 218 10*3/uL (ref 145–400)
RBC: 4.18 10*6/uL (ref 3.70–5.45)
RDW: 15.3 % — ABNORMAL HIGH (ref 11.2–14.5)
WBC: 5 10*3/uL (ref 3.9–10.3)
lymph#: 1.2 10*3/uL (ref 0.9–3.3)

## 2010-12-19 ENCOUNTER — Other Ambulatory Visit: Payer: Self-pay | Admitting: Hematology and Oncology

## 2010-12-19 ENCOUNTER — Encounter (HOSPITAL_BASED_OUTPATIENT_CLINIC_OR_DEPARTMENT_OTHER): Payer: Medicare Other | Admitting: Hematology and Oncology

## 2010-12-19 DIAGNOSIS — I10 Essential (primary) hypertension: Secondary | ICD-10-CM

## 2010-12-19 DIAGNOSIS — D638 Anemia in other chronic diseases classified elsewhere: Secondary | ICD-10-CM

## 2010-12-19 DIAGNOSIS — E119 Type 2 diabetes mellitus without complications: Secondary | ICD-10-CM

## 2010-12-19 DIAGNOSIS — D649 Anemia, unspecified: Secondary | ICD-10-CM

## 2010-12-19 LAB — BASIC METABOLIC PANEL
BUN: 20 mg/dL (ref 6–23)
CO2: 23 mEq/L (ref 19–32)
Calcium: 9.5 mg/dL (ref 8.4–10.5)
Chloride: 101 mEq/L (ref 96–112)
Creatinine, Ser: 1.08 mg/dL (ref 0.50–1.10)
Glucose, Bld: 152 mg/dL — ABNORMAL HIGH (ref 70–99)
Potassium: 4.9 mEq/L (ref 3.5–5.3)
Sodium: 134 mEq/L — ABNORMAL LOW (ref 135–145)

## 2010-12-19 LAB — CBC WITH DIFFERENTIAL/PLATELET
BASO%: 0.5 % (ref 0.0–2.0)
Basophils Absolute: 0 10*3/uL (ref 0.0–0.1)
EOS%: 8.7 % — ABNORMAL HIGH (ref 0.0–7.0)
Eosinophils Absolute: 0.5 10*3/uL (ref 0.0–0.5)
HCT: 38.6 % (ref 34.8–46.6)
HGB: 12.5 g/dL (ref 11.6–15.9)
LYMPH%: 31.3 % (ref 14.0–49.7)
MCH: 27.3 pg (ref 25.1–34.0)
MCHC: 32.3 g/dL (ref 31.5–36.0)
MCV: 84.4 fL (ref 79.5–101.0)
MONO#: 0.3 10*3/uL (ref 0.1–0.9)
MONO%: 4.8 % (ref 0.0–14.0)
NEUT#: 3 10*3/uL (ref 1.5–6.5)
NEUT%: 54.7 % (ref 38.4–76.8)
Platelets: 220 10*3/uL (ref 145–400)
RBC: 4.57 10*6/uL (ref 3.70–5.45)
RDW: 14.8 % — ABNORMAL HIGH (ref 11.2–14.5)
WBC: 5.6 10*3/uL (ref 3.9–10.3)
lymph#: 1.7 10*3/uL (ref 0.9–3.3)

## 2011-01-09 ENCOUNTER — Other Ambulatory Visit: Payer: Self-pay | Admitting: Hematology and Oncology

## 2011-01-09 ENCOUNTER — Encounter (HOSPITAL_BASED_OUTPATIENT_CLINIC_OR_DEPARTMENT_OTHER): Payer: Medicare Other | Admitting: Hematology and Oncology

## 2011-01-09 DIAGNOSIS — D509 Iron deficiency anemia, unspecified: Secondary | ICD-10-CM

## 2011-01-09 DIAGNOSIS — D638 Anemia in other chronic diseases classified elsewhere: Secondary | ICD-10-CM

## 2011-01-09 LAB — CBC WITH DIFFERENTIAL/PLATELET
BASO%: 0.3 % (ref 0.0–2.0)
Basophils Absolute: 0 10*3/uL (ref 0.0–0.1)
EOS%: 9.6 % — ABNORMAL HIGH (ref 0.0–7.0)
Eosinophils Absolute: 0.6 10*3/uL — ABNORMAL HIGH (ref 0.0–0.5)
HCT: 32.2 % — ABNORMAL LOW (ref 34.8–46.6)
HGB: 10.5 g/dL — ABNORMAL LOW (ref 11.6–15.9)
LYMPH%: 24.7 % (ref 14.0–49.7)
MCH: 27.4 pg (ref 25.1–34.0)
MCHC: 32.7 g/dL (ref 31.5–36.0)
MCV: 83.9 fL (ref 79.5–101.0)
MONO#: 0.3 10*3/uL (ref 0.1–0.9)
MONO%: 5.6 % (ref 0.0–14.0)
NEUT#: 3.6 10*3/uL (ref 1.5–6.5)
NEUT%: 59.8 % (ref 38.4–76.8)
Platelets: 233 10*3/uL (ref 145–400)
RBC: 3.84 10*6/uL (ref 3.70–5.45)
RDW: 15.1 % — ABNORMAL HIGH (ref 11.2–14.5)
WBC: 6.1 10*3/uL (ref 3.9–10.3)
lymph#: 1.5 10*3/uL (ref 0.9–3.3)

## 2011-01-17 ENCOUNTER — Other Ambulatory Visit (HOSPITAL_COMMUNITY): Payer: Self-pay | Admitting: Hematology and Oncology

## 2011-01-17 DIAGNOSIS — D638 Anemia in other chronic diseases classified elsewhere: Secondary | ICD-10-CM

## 2011-01-21 ENCOUNTER — Other Ambulatory Visit (HOSPITAL_COMMUNITY): Payer: Self-pay | Admitting: Hematology and Oncology

## 2011-01-30 ENCOUNTER — Ambulatory Visit: Payer: Medicare Other

## 2011-01-30 ENCOUNTER — Encounter: Payer: Self-pay | Admitting: *Deleted

## 2011-01-30 ENCOUNTER — Other Ambulatory Visit (HOSPITAL_BASED_OUTPATIENT_CLINIC_OR_DEPARTMENT_OTHER): Payer: Medicare Other

## 2011-01-30 ENCOUNTER — Other Ambulatory Visit: Payer: Self-pay | Admitting: Hematology and Oncology

## 2011-01-30 DIAGNOSIS — D509 Iron deficiency anemia, unspecified: Secondary | ICD-10-CM

## 2011-01-30 DIAGNOSIS — D638 Anemia in other chronic diseases classified elsewhere: Secondary | ICD-10-CM

## 2011-01-30 LAB — CBC WITH DIFFERENTIAL/PLATELET
BASO%: 0.3 % (ref 0.0–2.0)
Basophils Absolute: 0 10*3/uL (ref 0.0–0.1)
EOS%: 7.2 % — ABNORMAL HIGH (ref 0.0–7.0)
Eosinophils Absolute: 0.4 10*3/uL (ref 0.0–0.5)
HCT: 38.3 % (ref 34.8–46.6)
HGB: 12 g/dL (ref 11.6–15.9)
LYMPH%: 20.4 % (ref 14.0–49.7)
MCH: 27.4 pg (ref 25.1–34.0)
MCHC: 31.4 g/dL — ABNORMAL LOW (ref 31.5–36.0)
MCV: 87.3 fL (ref 79.5–101.0)
MONO#: 0.3 10*3/uL (ref 0.1–0.9)
MONO%: 7.1 % (ref 0.0–14.0)
NEUT#: 3.2 10*3/uL (ref 1.5–6.5)
NEUT%: 65 % (ref 38.4–76.8)
Platelets: 245 10*3/uL (ref 145–400)
RBC: 4.39 10*6/uL (ref 3.70–5.45)
RDW: 17.5 % — ABNORMAL HIGH (ref 11.2–14.5)
WBC: 4.9 10*3/uL (ref 3.9–10.3)
lymph#: 1 10*3/uL (ref 0.9–3.3)

## 2011-01-30 MED ORDER — DARBEPOETIN ALFA-POLYSORBATE 500 MCG/ML IJ SOLN: 300.0000 ug | Freq: Once | INTRAMUSCULAR | Status: DC

## 2011-02-06 ENCOUNTER — Ambulatory Visit (INDEPENDENT_AMBULATORY_CARE_PROVIDER_SITE_OTHER): Payer: Medicare Other | Admitting: General Surgery

## 2011-02-06 ENCOUNTER — Encounter (INDEPENDENT_AMBULATORY_CARE_PROVIDER_SITE_OTHER): Payer: Self-pay | Admitting: General Surgery

## 2011-02-06 VITALS — BP 124/58 | HR 60 | Temp 96.7°F | Resp 16 | Ht 61.0 in | Wt 117.1 lb

## 2011-02-06 DIAGNOSIS — Z803 Family history of malignant neoplasm of breast: Secondary | ICD-10-CM

## 2011-02-06 NOTE — Progress Notes (Signed)
Chief Complaint  Patient presents with  . Follow-up    f/u recheck breast     HPI Abigail Hoover is a 65 y.o. female.   HPI  Abigail Hoover is a patient in the high risk category for breast cancer and is here for her 6 month visit.  She has no breast complaints.  She is going to [place] for 2 months.  Last MMG 5/12.  Past Medical History  Diagnosis Date  . Diabetes mellitus   . Hypertension   . Thyroid disease   . Arthritis   . Anemia   . GERD (gastroesophageal reflux disease)   . Hypercholesterolemia   . Bladder irritation     Past Surgical History  Procedure Date  . Abdominal hysterectomy     TAH  . Cervical disc surgery   . Cataract extraction   . Breast biopsy     Left breast biopsy benign lesion    Family History  Problem Relation Age of Onset  . Breast cancer Sister   . Heart disease Mother     heart attack  . Stroke Father     brain hemorrhage  . Diabetes Brother   . Cancer Brother     Social History History  Substance Use Topics  . Smoking status: Never Smoker   . Smokeless tobacco: Never Used  . Alcohol Use: No    Allergies  Allergen Reactions  . Ezetimibe Itching  . Atorvastatin     REACTION: rash    Current Outpatient Prescriptions  Medication Sig Dispense Refill  . amLODipine (NORVASC) 10 MG tablet Take 10 mg by mouth daily.        Marland Kitchen aspirin 81 MG tablet Take 81 mg by mouth daily.        . clonazePAM (KLONOPIN) 1 MG tablet Take 1 mg by mouth as needed. Take daily as needed.       . colesevelam (WELCHOL) 625 MG tablet Take 3,750 mg by mouth daily.       . Colesevelam HCl (WELCHOL PO) Take by mouth.       . Colesevelam HCl [facility]) 3.75 G PACK Take by mouth daily.       Marland Kitchen dexlansoprazole (DEXILANT) 60 MG capsule Take 60 mg by mouth daily.        . ferrous sulfate 325 (65 FE) MG tablet Take 325 mg by mouth daily with breakfast.        . furosemide (LASIX) 40 MG tablet Take 40 mg by mouth daily. And as directed      . gabapentin (NEURONTIN)  300 MG capsule Take 300 mg by mouth daily.        Marland Kitchen levothyroxine (SYNTHROID) 75 MCG tablet Take 75 mcg by mouth daily.        Marland Kitchen losartan-hydrochlorothiazide (HYZAAR) 100-25 MG per tablet Take 1 tablet by mouth daily.        . metoprolol (TOPROL-XL) 100 MG 24 hr tablet Take 100 mg by mouth daily.       . miglitol (GLYSET) 25 MG tablet 25 mg. One tablet by mouth once daily       . Multiple Vitamin (MULTIVITAMIN) tablet Take 1 tablet by mouth daily.        . pentosan polysulfate (ELMIRON) 100 MG capsule Take 100 mg by mouth. Take 1 tablet by mouth two times a day       . pregabalin (LYRICA) 100 MG capsule Take 100 mg by mouth 2 (two) times daily.       Marland Kitchen  pregabalin (LYRICA) 50 MG capsule Take 50 mg by mouth 3 (three) times daily.       . sitaGLIPtan-metformin (JANUMET) 50-1000 MG per tablet Take 2 tablets by mouth daily.       Marland Kitchen triamcinolone (KENALOG) 0.1 % cream         Review of Systems Review of Systems  Respiratory:       Breasts- no nipple discharge, no masses ,no pain  Musculoskeletal: Positive for back pain.    Blood pressure 124/58, pulse 60, temperature 96.7 F (35.9 C), temperature source Temporal, resp. rate 16, height 5\' 1"  (1.549 m), weight 117 lb 2 oz (53.128 kg).  Physical Exam Physical Exam  Constitutional: She appears well-developed and well-nourished. No distress.  HENT:  Head: Normocephalic and atraumatic.  Pulmonary/Chest:       Breasts-bilateral scars are noted; no masses, suspicious skin changes or nipple discharge.  Musculoskeletal:       No axillary adenopathy  Lymphadenopathy:    She has no cervical adenopathy.    Data Reviewed   Assessment    High risk for breast cancer- no clinical evidence for malignancy    Plan    Return visit in 6 months.  MMG 07/2011.       Abigail Hoover J 02/06/2011, 1:27 PM

## 2011-02-15 ENCOUNTER — Other Ambulatory Visit (HOSPITAL_BASED_OUTPATIENT_CLINIC_OR_DEPARTMENT_OTHER): Payer: Medicare Other | Admitting: Lab

## 2011-02-15 ENCOUNTER — Telehealth: Payer: Self-pay | Admitting: *Deleted

## 2011-02-15 ENCOUNTER — Ambulatory Visit: Payer: Medicare Other

## 2011-02-15 ENCOUNTER — Other Ambulatory Visit: Payer: Self-pay | Admitting: Hematology and Oncology

## 2011-02-15 DIAGNOSIS — D509 Iron deficiency anemia, unspecified: Secondary | ICD-10-CM

## 2011-02-15 DIAGNOSIS — D638 Anemia in other chronic diseases classified elsewhere: Secondary | ICD-10-CM

## 2011-02-15 LAB — CBC WITH DIFFERENTIAL/PLATELET
BASO%: 0.4 % (ref 0.0–2.0)
Basophils Absolute: 0 10*3/uL (ref 0.0–0.1)
EOS%: 5.3 % (ref 0.0–7.0)
Eosinophils Absolute: 0.3 10*3/uL (ref 0.0–0.5)
HCT: 34.4 % — ABNORMAL LOW (ref 34.8–46.6)
HGB: 11 g/dL — ABNORMAL LOW (ref 11.6–15.9)
LYMPH%: 25.8 % (ref 14.0–49.7)
MCH: 28 pg (ref 25.1–34.0)
MCHC: 31.9 g/dL (ref 31.5–36.0)
MCV: 87.6 fL (ref 79.5–101.0)
MONO#: 0.3 10*3/uL (ref 0.1–0.9)
MONO%: 6 % (ref 0.0–14.0)
NEUT#: 3.6 10*3/uL (ref 1.5–6.5)
NEUT%: 62.5 % (ref 38.4–76.8)
Platelets: 241 10*3/uL (ref 145–400)
RBC: 3.93 10*6/uL (ref 3.70–5.45)
RDW: 15.1 % — ABNORMAL HIGH (ref 11.2–14.5)
WBC: 5.7 10*3/uL (ref 3.9–10.3)
lymph#: 1.5 10*3/uL (ref 0.9–3.3)

## 2011-02-15 MED ORDER — DARBEPOETIN ALFA-POLYSORBATE 500 MCG/ML IJ SOLN: 300.0000 ug | Freq: Once | INTRAMUSCULAR | Status: DC

## 2011-03-26 HISTORY — PX: COLONOSCOPY: SHX174

## 2011-04-13 ENCOUNTER — Telehealth: Payer: Self-pay | Admitting: Hematology and Oncology

## 2011-04-13 NOTE — Telephone Encounter (Signed)
lmonvm for pt re appt for 2/12 and mailed schedule

## 2011-04-24 ENCOUNTER — Encounter: Payer: Self-pay | Admitting: Hematology and Oncology

## 2011-04-29 ENCOUNTER — Other Ambulatory Visit: Payer: Self-pay | Admitting: Pulmonary Disease

## 2011-04-30 ENCOUNTER — Telehealth: Payer: Self-pay | Admitting: Pulmonary Disease

## 2011-04-30 MED ORDER — FUROSEMIDE 20 MG PO TABS: 20.0000 mg | ORAL_TABLET | Freq: Every day | ORAL | Status: DC

## 2011-04-30 NOTE — Telephone Encounter (Signed)
Pt returned call.  She c/o right leg swelling x1.66months she feels her "heart beat in there."  Pt denies any redness or swelling in the area.  Pt last seen 3.6.12.  Follow up scheduled with RA 2.21.13 and appt card mailed to pt's verified home address.  RA not in office until 2.7.13 and pt reported that she is fine to wait until then.    Dr Elsworth Soho please advise if okay to fill pt's lasix 40mg , thanks!  **CORRECTION: wrong dosage was written in previous documentation - pt was given rx for lasix 40mg  #20 with 3 refills on 12.17.11.

## 2011-04-30 NOTE — Telephone Encounter (Signed)
Pt was given enough to have x 1 week of treatment. Pt will need to call for additional fills.

## 2011-04-30 NOTE — Telephone Encounter (Signed)
OK - lasix 20 mg qd x 2 weeks , then FU

## 2011-04-30 NOTE — Telephone Encounter (Signed)
Last rx was for lasix 20mg  1 tab QD #20 with 3 refills on 12.17.11.  Saw RA 3.16.2012 and was told to take the lasix x1 week.  Last refill request was 2.4.13 and note was added that pt needed to contact the office as it was last documented that she needed to take this med x 1 week.  Follow up was documented "as needed."  LMOM TCB x1.

## 2011-04-30 NOTE — Telephone Encounter (Signed)
Left detailed message on named answering machine that RA okayed for her to have lasix 20mg  1 qd x2 weeks, rx has been sent to [facility] on [address], and to keep the 2.21.13 appt @ 1545 with Dr Elsworth Soho.  Encouraged pt to call with any questions/concerns.  rx sent.

## 2011-05-07 ENCOUNTER — Ambulatory Visit (HOSPITAL_BASED_OUTPATIENT_CLINIC_OR_DEPARTMENT_OTHER): Payer: Medicare Other | Admitting: Hematology and Oncology

## 2011-05-07 ENCOUNTER — Other Ambulatory Visit: Payer: Medicare Other | Admitting: Lab

## 2011-05-07 ENCOUNTER — Telehealth: Payer: Self-pay | Admitting: Hematology and Oncology

## 2011-05-07 VITALS — BP 139/59 | HR 72 | Temp 97.1°F | Ht 61.0 in | Wt 115.0 lb

## 2011-05-07 DIAGNOSIS — I10 Essential (primary) hypertension: Secondary | ICD-10-CM | POA: Diagnosis not present

## 2011-05-07 DIAGNOSIS — D638 Anemia in other chronic diseases classified elsewhere: Secondary | ICD-10-CM | POA: Diagnosis not present

## 2011-05-07 DIAGNOSIS — E119 Type 2 diabetes mellitus without complications: Secondary | ICD-10-CM | POA: Diagnosis not present

## 2011-05-07 DIAGNOSIS — D649 Anemia, unspecified: Secondary | ICD-10-CM | POA: Diagnosis not present

## 2011-05-07 LAB — CBC WITH DIFFERENTIAL/PLATELET
BASO%: 0.5 % (ref 0.0–2.0)
Basophils Absolute: 0 10*3/uL (ref 0.0–0.1)
EOS%: 3.6 % (ref 0.0–7.0)
Eosinophils Absolute: 0.3 10*3/uL (ref 0.0–0.5)
HCT: 28.3 % — ABNORMAL LOW (ref 34.8–46.6)
HGB: 9.2 g/dL — ABNORMAL LOW (ref 11.6–15.9)
LYMPH%: 20.9 % (ref 14.0–49.7)
MCH: 27.9 pg (ref 25.1–34.0)
MCHC: 32.4 g/dL (ref 31.5–36.0)
MCV: 86.1 fL (ref 79.5–101.0)
MONO#: 0.4 10*3/uL (ref 0.1–0.9)
MONO%: 4.5 % (ref 0.0–14.0)
NEUT#: 5.6 10*3/uL (ref 1.5–6.5)
NEUT%: 70.5 % (ref 38.4–76.8)
Platelets: 264 10*3/uL (ref 145–400)
RBC: 3.29 10*6/uL — ABNORMAL LOW (ref 3.70–5.45)
RDW: 15.9 % — ABNORMAL HIGH (ref 11.2–14.5)
WBC: 7.9 10*3/uL (ref 3.9–10.3)
lymph#: 1.7 10*3/uL (ref 0.9–3.3)

## 2011-05-07 LAB — BASIC METABOLIC PANEL
BUN: 15 mg/dL (ref 6–23)
CO2: 22 mEq/L (ref 19–32)
Calcium: 9.7 mg/dL (ref 8.4–10.5)
Chloride: 103 mEq/L (ref 96–112)
Creatinine, Ser: 0.91 mg/dL (ref 0.50–1.10)
Glucose, Bld: 181 mg/dL — ABNORMAL HIGH (ref 70–99)
Potassium: 4.7 mEq/L (ref 3.5–5.3)
Sodium: 135 mEq/L (ref 135–145)

## 2011-05-07 MED ORDER — DARBEPOETIN ALFA-POLYSORBATE 500 MCG/ML IJ SOLN
300.0000 ug | Freq: Once | INTRAMUSCULAR | Status: AC
  Administered 2011-05-07: 300 ug via SUBCUTANEOUS
  Filled 2011-05-07: qty 1

## 2011-05-07 NOTE — Progress Notes (Signed)
This office note has been dictated.

## 2011-05-07 NOTE — Progress Notes (Signed)
CC:   Abigail Vennie Homans, MD, Vivien Rossetti D. [name], M.D.  IDENTIFYING STATEMENT:  Patient is a 66 year old woman with anemia of chronic disease.  She presents for followup.  INTERVAL HISTORY:  Abigail Hoover began Aranesp every 3 weeks on September 05, 2010.  Has been tolerating injections with very minimal difficulty.  Missed her shots for past eight weeks.  [place].  Besides fatigue has no current complaints.   MEDICATIONS:  Aranesp 300 mcg every 3 weeks.  Rest of medications reviewed and updated.  ALLERGIES:  Atorvastatin.  PHYSICAL EXAM:  Patient is alert, oriented x3.  Vitals:  Pulse 72, blood pressure 139/59, temperature 97.1, respirations 18, weight 115 pounds. HEENT:  Head is atraumatic.  Mouth moist.  Chest:  Clear.  CVS: Unremarkable.  Abdomen:  Soft, nontender.  Bowel sounds present. Extremities:  No calf tenderness.  LAB DATA:  05/07/2011, white cell count 7.9, hemoglobin 9.2, hematocrit 28.3, platelets 264.  C-MET pending.  IMPRESSION AND PLAN:  Abigail Hoover is a 66 year old woman with anemia of chronic disease.  She will continue to receive Aranesp at the current dose every 3 weeks.  As she is strictly vegetarian, she is to supplement her diet with vitamin B12.  She follows up in 4-5 months' time.    ______________________________ Nira Retort, M.D. LIO/MEDQ  D:  05/07/2011  T:  05/07/2011  Job:  [id number]

## 2011-05-07 NOTE — Telephone Encounter (Signed)
Gv pt appt for march-aug2013

## 2011-05-14 ENCOUNTER — Telehealth: Payer: Self-pay | Admitting: Nurse Practitioner

## 2011-05-14 MED ORDER — CYANOCOBALAMIN 1000 MCG PO TABS: 1000.0000 ug | ORAL_TABLET | Freq: Every day | ORAL | Status: AC

## 2011-05-14 NOTE — Telephone Encounter (Signed)
Pt called to inquire re: how much Vitamin D12 and how often would Dr. Jamse Arn like her to take it?  Information to Dr. Jamse Arn for orders.

## 2011-05-14 NOTE — Telephone Encounter (Signed)
Called patient and instructed to take Vitamin B12 100 mg daily.

## 2011-05-16 ENCOUNTER — Ambulatory Visit (INDEPENDENT_AMBULATORY_CARE_PROVIDER_SITE_OTHER): Payer: Medicare Other | Admitting: Pulmonary Disease

## 2011-05-16 DIAGNOSIS — R609 Edema, unspecified: Secondary | ICD-10-CM

## 2011-05-16 DIAGNOSIS — I10 Essential (primary) hypertension: Secondary | ICD-10-CM | POA: Diagnosis not present

## 2011-05-16 MED ORDER — FUROSEMIDE 40 MG PO TABS: ORAL_TABLET | ORAL | Status: DC

## 2011-05-16 NOTE — Patient Instructions (Signed)
STOP taking amlodepin - check your Bp every other day & take record to Dr Dwyane Dee - maybe you do not need this medication Take lasix 40 mg daily x 2 weeks , then stop & start this again if wt > 115 lbs

## 2011-05-16 NOTE — Progress Notes (Signed)
  Subjective:    Patient ID: Abigail Hoover, female    DOB: [birth year], 66 y.o.   MRN: [id number]  HPI 66/F, diabetic, non smoker for FU of pedal edema  Adm 10/20- 28/10 for L1-3 compression fracture after an accidental fall. CXR suggested pulmonary edema & CT chest showed BL patchy air space opacities & BL effusions. Echo showed EF 55-60%, cardiac cath showed nml coronaries, BNP was 156. E. coli UTI was treated with keflex & LFTs normalised. BP meds were changed -tekturna, hydrallazine were stopped, Maxzide was started & [place] met dose was increased. Of note , other labs - Hb 9.8 attributed to iron deficiency, TSh 3.1, alb 2.7, UA prot neg   February 14, 2009 duplex neg for DVT  BNP low 10/11  pedal edema is  long standing, this precedes norvasc   05/16/2011 pt states was in [place] 11/27-01/31 and c/o cough and leg edema lasix 20 mg qd x 2 weeks called in 2/13  Seen by Dr Jamse Arn for anemia of chronic disease - aranesp q 3ks & B12  wt has dropped to 116lbs, is watching salt in her diet  NO dyspnea, cough or wheeze.   Past Medical History  Diagnosis Date  . Diabetes mellitus   . Hypertension   . Thyroid disease   . Arthritis   . Anemia   . GERD (gastroesophageal reflux disease)   . Hypercholesterolemia   . Bladder irritation   . Iron deficiency anemia, unspecified      Review of Systems Patient denies significant dyspnea,cough, hemoptysis,  chest pain, palpitations, pedal edema, orthopnea, paroxysmal nocturnal dyspnea, lightheadedness, nausea, vomiting, abdominal or  leg pains      Objective:   Physical Exam  Gen. Pleasant, well-nourished, in no distress ENT - no lesions, no post nasal drip Neck: No JVD, no thyromegaly, no carotid bruits Lungs: no use of accessory muscles, no dullness to percussion, clear without rales or rhonchi  Cardiovascular: Rhythm regular, heart sounds  normal, no murmurs or gallops, 2+ peripheral edema Musculoskeletal: No deformities, no cyanosis or  clubbing        Assessment & Plan:

## 2011-05-17 ENCOUNTER — Encounter: Payer: Self-pay | Admitting: Pulmonary Disease

## 2011-05-17 NOTE — Assessment & Plan Note (Signed)
STOP taking amlodepin - check your Bp every other day & take record to Dr Dwyane Dee - maybe you do not need this medication

## 2011-05-17 NOTE — Assessment & Plan Note (Signed)
Take lasix 40 mg daily x 2 weeks , then stop & start this again if wt > 115 lbs

## 2011-05-28 ENCOUNTER — Other Ambulatory Visit: Payer: Medicare Other | Admitting: Lab

## 2011-05-28 ENCOUNTER — Ambulatory Visit: Payer: Medicare Other

## 2011-05-29 ENCOUNTER — Telehealth: Payer: Self-pay | Admitting: Hematology and Oncology

## 2011-05-29 ENCOUNTER — Ambulatory Visit: Payer: Medicare Other

## 2011-05-29 ENCOUNTER — Other Ambulatory Visit (HOSPITAL_BASED_OUTPATIENT_CLINIC_OR_DEPARTMENT_OTHER): Payer: Medicare Other | Admitting: Lab

## 2011-05-29 DIAGNOSIS — D638 Anemia in other chronic diseases classified elsewhere: Secondary | ICD-10-CM

## 2011-05-29 LAB — CBC WITH DIFFERENTIAL/PLATELET
BASO%: 0.4 % (ref 0.0–2.0)
Basophils Absolute: 0 10*3/uL (ref 0.0–0.1)
EOS%: 3.1 % (ref 0.0–7.0)
Eosinophils Absolute: 0.2 10*3/uL (ref 0.0–0.5)
HCT: 38.6 % (ref 34.8–46.6)
HGB: 12.1 g/dL (ref 11.6–15.9)
LYMPH%: 19.5 % (ref 14.0–49.7)
MCH: 27.7 pg (ref 25.1–34.0)
MCHC: 31.3 g/dL — ABNORMAL LOW (ref 31.5–36.0)
MCV: 88.5 fL (ref 79.5–101.0)
MONO#: 0.3 10*3/uL (ref 0.1–0.9)
MONO%: 6 % (ref 0.0–14.0)
NEUT#: 3.8 10*3/uL (ref 1.5–6.5)
NEUT%: 71 % (ref 38.4–76.8)
Platelets: 271 10*3/uL (ref 145–400)
RBC: 4.36 10*6/uL (ref 3.70–5.45)
RDW: 16.9 % — ABNORMAL HIGH (ref 11.2–14.5)
WBC: 5.4 10*3/uL (ref 3.9–10.3)
lymph#: 1 10*3/uL (ref 0.9–3.3)

## 2011-05-29 NOTE — Telephone Encounter (Signed)
Pt missed 3/5 appt ok per meredith to put in today for lab and inj  aom

## 2011-05-29 NOTE — Progress Notes (Signed)
Aranesp held for Hgb 12.1 today. Patient instructed to return for future appointments and to call with any concerns.

## 2011-05-30 DIAGNOSIS — I10 Essential (primary) hypertension: Secondary | ICD-10-CM | POA: Diagnosis not present

## 2011-05-30 DIAGNOSIS — E119 Type 2 diabetes mellitus without complications: Secondary | ICD-10-CM | POA: Diagnosis not present

## 2011-05-30 DIAGNOSIS — E785 Hyperlipidemia, unspecified: Secondary | ICD-10-CM | POA: Diagnosis not present

## 2011-05-30 DIAGNOSIS — E039 Hypothyroidism, unspecified: Secondary | ICD-10-CM | POA: Diagnosis not present

## 2011-06-04 DIAGNOSIS — I10 Essential (primary) hypertension: Secondary | ICD-10-CM | POA: Diagnosis not present

## 2011-06-04 DIAGNOSIS — E039 Hypothyroidism, unspecified: Secondary | ICD-10-CM | POA: Diagnosis not present

## 2011-06-04 DIAGNOSIS — E119 Type 2 diabetes mellitus without complications: Secondary | ICD-10-CM | POA: Diagnosis not present

## 2011-06-04 DIAGNOSIS — R634 Abnormal weight loss: Secondary | ICD-10-CM | POA: Diagnosis not present

## 2011-06-04 DIAGNOSIS — L0390 Cellulitis, unspecified: Secondary | ICD-10-CM | POA: Diagnosis not present

## 2011-06-04 DIAGNOSIS — L0291 Cutaneous abscess, unspecified: Secondary | ICD-10-CM | POA: Diagnosis not present

## 2011-06-05 DIAGNOSIS — R634 Abnormal weight loss: Secondary | ICD-10-CM | POA: Diagnosis not present

## 2011-06-05 DIAGNOSIS — L0291 Cutaneous abscess, unspecified: Secondary | ICD-10-CM | POA: Diagnosis not present

## 2011-06-05 DIAGNOSIS — R51 Headache: Secondary | ICD-10-CM | POA: Diagnosis not present

## 2011-06-05 DIAGNOSIS — L0390 Cellulitis, unspecified: Secondary | ICD-10-CM | POA: Diagnosis not present

## 2011-06-18 ENCOUNTER — Other Ambulatory Visit: Payer: Medicare Other | Admitting: Lab

## 2011-06-18 ENCOUNTER — Ambulatory Visit (HOSPITAL_BASED_OUTPATIENT_CLINIC_OR_DEPARTMENT_OTHER): Payer: Medicare Other

## 2011-06-18 VITALS — BP 155/60 | HR 76 | Temp 97.6°F

## 2011-06-18 DIAGNOSIS — D638 Anemia in other chronic diseases classified elsewhere: Secondary | ICD-10-CM

## 2011-06-18 LAB — CBC WITH DIFFERENTIAL/PLATELET
BASO%: 0.4 % (ref 0.0–2.0)
Basophils Absolute: 0 10*3/uL (ref 0.0–0.1)
EOS%: 5.2 % (ref 0.0–7.0)
Eosinophils Absolute: 0.3 10*3/uL (ref 0.0–0.5)
HCT: 34.1 % — ABNORMAL LOW (ref 34.8–46.6)
HGB: 10.8 g/dL — ABNORMAL LOW (ref 11.6–15.9)
LYMPH%: 26.5 % (ref 14.0–49.7)
MCH: 27.8 pg (ref 25.1–34.0)
MCHC: 31.6 g/dL (ref 31.5–36.0)
MCV: 88 fL (ref 79.5–101.0)
MONO#: 0.3 10*3/uL (ref 0.1–0.9)
MONO%: 4.5 % (ref 0.0–14.0)
NEUT#: 4.2 10*3/uL (ref 1.5–6.5)
NEUT%: 63.4 % (ref 38.4–76.8)
Platelets: 281 10*3/uL (ref 145–400)
RBC: 3.88 10*6/uL (ref 3.70–5.45)
RDW: 14.8 % — ABNORMAL HIGH (ref 11.2–14.5)
WBC: 6.7 10*3/uL (ref 3.9–10.3)
lymph#: 1.8 10*3/uL (ref 0.9–3.3)

## 2011-06-18 MED ORDER — DARBEPOETIN ALFA-POLYSORBATE 300 MCG/0.6ML IJ SOLN
300.0000 ug | Freq: Once | INTRAMUSCULAR | Status: AC
  Administered 2011-06-18: 300 ug via SUBCUTANEOUS
  Filled 2011-06-18: qty 0.6

## 2011-06-18 NOTE — Patient Instructions (Signed)
Pt in for Aranesp.  Her only complaint is fatigue which is not new.  Pt to call for questions and concerns.  Pt discharged home ambulatory with spouse.

## 2011-06-26 ENCOUNTER — Other Ambulatory Visit: Payer: Self-pay | Admitting: *Deleted

## 2011-06-26 DIAGNOSIS — E119 Type 2 diabetes mellitus without complications: Secondary | ICD-10-CM | POA: Diagnosis not present

## 2011-06-26 DIAGNOSIS — E7800 Pure hypercholesterolemia, unspecified: Secondary | ICD-10-CM | POA: Diagnosis not present

## 2011-06-26 DIAGNOSIS — I10 Essential (primary) hypertension: Secondary | ICD-10-CM | POA: Diagnosis not present

## 2011-07-03 DIAGNOSIS — L989 Disorder of the skin and subcutaneous tissue, unspecified: Secondary | ICD-10-CM | POA: Diagnosis not present

## 2011-07-09 ENCOUNTER — Ambulatory Visit: Payer: Medicare Other

## 2011-07-09 ENCOUNTER — Other Ambulatory Visit (HOSPITAL_BASED_OUTPATIENT_CLINIC_OR_DEPARTMENT_OTHER): Payer: Medicare Other | Admitting: Lab

## 2011-07-09 DIAGNOSIS — D638 Anemia in other chronic diseases classified elsewhere: Secondary | ICD-10-CM | POA: Diagnosis not present

## 2011-07-09 LAB — CBC WITH DIFFERENTIAL/PLATELET
BASO%: 0.5 % (ref 0.0–2.0)
Basophils Absolute: 0 10*3/uL (ref 0.0–0.1)
EOS%: 3.2 % (ref 0.0–7.0)
Eosinophils Absolute: 0.2 10*3/uL (ref 0.0–0.5)
HCT: 41.9 % (ref 34.8–46.6)
HGB: 13.3 g/dL (ref 11.6–15.9)
LYMPH%: 22.6 % (ref 14.0–49.7)
MCH: 27.8 pg (ref 25.1–34.0)
MCHC: 31.7 g/dL (ref 31.5–36.0)
MCV: 87.6 fL (ref 79.5–101.0)
MONO#: 0.4 10*3/uL (ref 0.1–0.9)
MONO%: 7.2 % (ref 0.0–14.0)
NEUT#: 3.2 10*3/uL (ref 1.5–6.5)
NEUT%: 66.5 % (ref 38.4–76.8)
Platelets: 263 10*3/uL (ref 145–400)
RBC: 4.79 10*6/uL (ref 3.70–5.45)
RDW: 15.3 % — ABNORMAL HIGH (ref 11.2–14.5)
WBC: 4.8 10*3/uL (ref 3.9–10.3)
lymph#: 1.1 10*3/uL (ref 0.9–3.3)
nRBC: 0 % (ref 0–0)

## 2011-07-09 MED ORDER — DARBEPOETIN ALFA-POLYSORBATE 500 MCG/ML IJ SOLN: 300.0000 ug | Freq: Once | INTRAMUSCULAR | Status: DC

## 2011-07-16 DIAGNOSIS — I10 Essential (primary) hypertension: Secondary | ICD-10-CM | POA: Diagnosis not present

## 2011-07-18 DIAGNOSIS — E039 Hypothyroidism, unspecified: Secondary | ICD-10-CM | POA: Diagnosis not present

## 2011-07-18 DIAGNOSIS — E119 Type 2 diabetes mellitus without complications: Secondary | ICD-10-CM | POA: Diagnosis not present

## 2011-07-30 ENCOUNTER — Ambulatory Visit: Payer: Medicare Other

## 2011-07-30 ENCOUNTER — Other Ambulatory Visit (HOSPITAL_BASED_OUTPATIENT_CLINIC_OR_DEPARTMENT_OTHER): Payer: Medicare Other | Admitting: Lab

## 2011-07-30 DIAGNOSIS — D638 Anemia in other chronic diseases classified elsewhere: Secondary | ICD-10-CM

## 2011-07-30 LAB — CBC WITH DIFFERENTIAL/PLATELET
BASO%: 0.4 % (ref 0.0–2.0)
Basophils Absolute: 0 10*3/uL (ref 0.0–0.1)
EOS%: 3.8 % (ref 0.0–7.0)
Eosinophils Absolute: 0.2 10*3/uL (ref 0.0–0.5)
HCT: 42.4 % (ref 34.8–46.6)
HGB: 13.4 g/dL (ref 11.6–15.9)
LYMPH%: 24.8 % (ref 14.0–49.7)
MCH: 26.8 pg (ref 25.1–34.0)
MCHC: 31.6 g/dL (ref 31.5–36.0)
MCV: 85 fL (ref 79.5–101.0)
MONO#: 0.3 10*3/uL (ref 0.1–0.9)
MONO%: 5 % (ref 0.0–14.0)
NEUT#: 4.3 10*3/uL (ref 1.5–6.5)
NEUT%: 66 % (ref 38.4–76.8)
Platelets: 256 10*3/uL (ref 145–400)
RBC: 4.99 10*6/uL (ref 3.70–5.45)
RDW: 14.8 % — ABNORMAL HIGH (ref 11.2–14.5)
WBC: 6.5 10*3/uL (ref 3.9–10.3)
lymph#: 1.6 10*3/uL (ref 0.9–3.3)
nRBC: 0 % (ref 0–0)

## 2011-07-30 NOTE — Progress Notes (Signed)
hgb 13.4, no aranesp indicated per parameters.  dmr

## 2011-08-12 DIAGNOSIS — L989 Disorder of the skin and subcutaneous tissue, unspecified: Secondary | ICD-10-CM | POA: Diagnosis not present

## 2011-08-20 ENCOUNTER — Ambulatory Visit (INDEPENDENT_AMBULATORY_CARE_PROVIDER_SITE_OTHER): Payer: Medicare Other | Admitting: General Surgery

## 2011-08-20 ENCOUNTER — Other Ambulatory Visit (HOSPITAL_BASED_OUTPATIENT_CLINIC_OR_DEPARTMENT_OTHER): Payer: Medicare Other | Admitting: Lab

## 2011-08-20 ENCOUNTER — Ambulatory Visit: Payer: Medicare Other

## 2011-08-20 ENCOUNTER — Encounter (INDEPENDENT_AMBULATORY_CARE_PROVIDER_SITE_OTHER): Payer: Self-pay | Admitting: General Surgery

## 2011-08-20 VITALS — BP 124/52 | HR 66 | Temp 98.0°F | Ht 61.0 in | Wt 109.8 lb

## 2011-08-20 DIAGNOSIS — Z803 Family history of malignant neoplasm of breast: Secondary | ICD-10-CM | POA: Diagnosis not present

## 2011-08-20 DIAGNOSIS — D638 Anemia in other chronic diseases classified elsewhere: Secondary | ICD-10-CM

## 2011-08-20 DIAGNOSIS — Z1239 Encounter for other screening for malignant neoplasm of breast: Secondary | ICD-10-CM

## 2011-08-20 LAB — CBC WITH DIFFERENTIAL/PLATELET
BASO%: 0.3 % (ref 0.0–2.0)
Basophils Absolute: 0 10*3/uL (ref 0.0–0.1)
EOS%: 5.2 % (ref 0.0–7.0)
Eosinophils Absolute: 0.3 10*3/uL (ref 0.0–0.5)
HCT: 38.1 % (ref 34.8–46.6)
HGB: 12.1 g/dL (ref 11.6–15.9)
LYMPH%: 29 % (ref 14.0–49.7)
MCH: 26.4 pg (ref 25.1–34.0)
MCHC: 31.6 g/dL (ref 31.5–36.0)
MCV: 83.4 fL (ref 79.5–101.0)
MONO#: 0.2 10*3/uL (ref 0.1–0.9)
MONO%: 4.1 % (ref 0.0–14.0)
NEUT#: 3.5 10*3/uL (ref 1.5–6.5)
NEUT%: 61.4 % (ref 38.4–76.8)
Platelets: 230 10*3/uL (ref 145–400)
RBC: 4.57 10*6/uL (ref 3.70–5.45)
RDW: 14.9 % — ABNORMAL HIGH (ref 11.2–14.5)
WBC: 5.7 10*3/uL (ref 3.9–10.3)
lymph#: 1.6 10*3/uL (ref 0.9–3.3)
nRBC: 0 % (ref 0–0)

## 2011-08-20 MED ORDER — DARBEPOETIN ALFA-POLYSORBATE 500 MCG/ML IJ SOLN: 300.0000 ug | Freq: Once | INTRAMUSCULAR | Status: DC

## 2011-08-20 NOTE — Patient Instructions (Signed)
Call if you feel any masses in your breasts.

## 2011-08-20 NOTE — Progress Notes (Signed)
Chief Complaint  Patient presents with  . Follow-up    reck br    HPI Abigail Hoover is a 66 y.o. female.   HPI  Abigail Hoover is a patient in the high risk category for breast cancer and is here for her 6 month visit.  She has no breast complaints.  She had a good time in [place].  Past Medical History  Diagnosis Date  . Diabetes mellitus   . Hypertension   . Thyroid disease   . Arthritis   . Anemia   . GERD (gastroesophageal reflux disease)   . Hypercholesterolemia   . Bladder irritation   . Iron deficiency anemia, unspecified     Past Surgical History  Procedure Date  . Abdominal hysterectomy     TAH  . Cervical disc surgery   . Cataract extraction   . Breast biopsy     Left breast biopsy benign lesion    Family History  Problem Relation Age of Onset  . Breast cancer Sister   . Heart disease Mother     heart attack  . Stroke Father     brain hemorrhage  . Diabetes Brother   . Cancer Brother     Social History History  Substance Use Topics  . Smoking status: Never Smoker   . Smokeless tobacco: Never Used  . Alcohol Use: No    Allergies  Allergen Reactions  . Ezetimibe Itching  . Atorvastatin     REACTION: rash    Current Outpatient Prescriptions  Medication Sig Dispense Refill  . amLODipine (NORVASC) 10 MG tablet Take 10 mg by mouth daily.        Marland Kitchen aspirin 81 MG tablet Take 81 mg by mouth daily.        . Cholecalciferol (VITAMIN D3) 1000 UNITS CAPS Take 1,000 Units by mouth daily.      . clonazePAM (KLONOPIN) 1 MG tablet Take 1 mg by mouth as needed. Take daily as needed.       . colesevelam (WELCHOL) 625 MG tablet Take 3,750 mg by mouth daily.       . cyanocobalamin ([facility]S VITAMIN B12) 1000 MCG tablet Take 1 tablet (1,000 mcg total) by mouth daily.      Marland Kitchen dexlansoprazole (DEXILANT) 60 MG capsule Take 60 mg by mouth daily.        . ferrous sulfate 325 (65 FE) MG tablet Take 325 mg by mouth 3 (three) times daily with meals.       . gabapentin  (NEURONTIN) 300 MG capsule Take 300 mg by mouth 3 (three) times daily.       Marland Kitchen levothyroxine (SYNTHROID) 75 MCG tablet Take 88 mcg by mouth daily.       Marland Kitchen losartan-hydrochlorothiazide (HYZAAR) 100-25 MG per tablet Take 1 tablet by mouth daily.        . metoprolol (TOPROL-XL) 100 MG 24 hr tablet Take 100 mg by mouth daily.       . miglitol (GLYSET) 25 MG tablet 25 mg. One tablet by mouth once daily       . Multiple Vitamin (MULTIVITAMIN) tablet Take 1 tablet by mouth daily.        . pentosan polysulfate (ELMIRON) 100 MG capsule Take 100 mg by mouth 3 (three) times daily before meals.       . sitaGLIPtan-metformin (JANUMET) 50-1000 MG per tablet Take 2 tablets by mouth daily.       Marland Kitchen triamcinolone (KENALOG) 0.1 % cream  No current facility-administered medications for this visit.   Facility-Administered Medications Ordered in Other Visits  Medication Dose Route Frequency Provider Last Rate Last Dose  . DISCONTD: darbepoetin alfa-polysorbate (ARANESP) injection 300 mcg  300 mcg Subcutaneous Once Nira Retort, MD        Review of Systems Review of Systems  Respiratory:       Breasts- no nipple discharge, no masses ,no pain      Blood pressure 124/52, pulse 66, temperature 98 F (36.7 C), temperature source Temporal, height 5\' 1"  (1.549 m), weight 109 lb 12.8 oz (49.805 kg), SpO2 98.00%.  Physical Exam Physical Exam  Constitutional: She appears well-developed and well-nourished. No distress.  HENT:  Head: Normocephalic and atraumatic.  Pulmonary/Chest:       Breasts-bilateral scars are noted; no masses, suspicious skin changes or nipple discharge.  Musculoskeletal:       No axillary adenopathy  Lymphadenopathy:    She has no cervical adenopathy.    Data Reviewed   Assessment    High risk for breast cancer- no clinical evidence for malignancy    Plan    Return visit in 6 months.  Schedule mammogram.       Abigail Hoover J 08/20/2011, 5:24 PM

## 2011-08-26 ENCOUNTER — Other Ambulatory Visit (INDEPENDENT_AMBULATORY_CARE_PROVIDER_SITE_OTHER): Payer: Self-pay | Admitting: General Surgery

## 2011-08-26 ENCOUNTER — Telehealth (INDEPENDENT_AMBULATORY_CARE_PROVIDER_SITE_OTHER): Payer: Self-pay

## 2011-08-26 DIAGNOSIS — Z1231 Encounter for screening mammogram for malignant neoplasm of breast: Secondary | ICD-10-CM

## 2011-08-26 NOTE — Telephone Encounter (Signed)
Pt is aware that she has a diagnostic mammogram scheduled for 09/12/11 at 12:40pm.

## 2011-08-27 DIAGNOSIS — R634 Abnormal weight loss: Secondary | ICD-10-CM | POA: Diagnosis not present

## 2011-08-27 DIAGNOSIS — D509 Iron deficiency anemia, unspecified: Secondary | ICD-10-CM | POA: Diagnosis not present

## 2011-08-27 DIAGNOSIS — Z1211 Encounter for screening for malignant neoplasm of colon: Secondary | ICD-10-CM | POA: Diagnosis not present

## 2011-08-27 DIAGNOSIS — K219 Gastro-esophageal reflux disease without esophagitis: Secondary | ICD-10-CM | POA: Diagnosis not present

## 2011-09-10 ENCOUNTER — Ambulatory Visit: Payer: Medicare Other

## 2011-09-10 ENCOUNTER — Other Ambulatory Visit (HOSPITAL_BASED_OUTPATIENT_CLINIC_OR_DEPARTMENT_OTHER): Payer: Medicare Other | Admitting: Lab

## 2011-09-10 ENCOUNTER — Other Ambulatory Visit: Payer: Self-pay | Admitting: *Deleted

## 2011-09-10 DIAGNOSIS — D638 Anemia in other chronic diseases classified elsewhere: Secondary | ICD-10-CM

## 2011-09-10 LAB — CBC WITH DIFFERENTIAL/PLATELET
BASO%: 0.3 % (ref 0.0–2.0)
Basophils Absolute: 0 10*3/uL (ref 0.0–0.1)
EOS%: 6.6 % (ref 0.0–7.0)
Eosinophils Absolute: 0.4 10*3/uL (ref 0.0–0.5)
HCT: 35.6 % (ref 34.8–46.6)
HGB: 11.6 g/dL (ref 11.6–15.9)
LYMPH%: 26.9 % (ref 14.0–49.7)
MCH: 26.2 pg (ref 25.1–34.0)
MCHC: 32.5 g/dL (ref 31.5–36.0)
MCV: 80.6 fL (ref 79.5–101.0)
MONO#: 0.3 10*3/uL (ref 0.1–0.9)
MONO%: 5.2 % (ref 0.0–14.0)
NEUT#: 3.6 10*3/uL (ref 1.5–6.5)
NEUT%: 61 % (ref 38.4–76.8)
Platelets: 232 10*3/uL (ref 145–400)
RBC: 4.42 10*6/uL (ref 3.70–5.45)
RDW: 14.4 % (ref 11.2–14.5)
WBC: 5.8 10*3/uL (ref 3.9–10.3)
lymph#: 1.6 10*3/uL (ref 0.9–3.3)

## 2011-09-10 MED ORDER — DARBEPOETIN ALFA-POLYSORBATE 500 MCG/ML IJ SOLN: 300.0000 ug | Freq: Once | INTRAMUSCULAR | Status: DC

## 2011-09-12 ENCOUNTER — Ambulatory Visit
Admission: RE | Admit: 2011-09-12 | Discharge: 2011-09-12 | Disposition: A | Discharge status: Home / Self Care | Payer: Medicare Other | Source: Ambulatory Visit | Attending: General Surgery | Admitting: General Surgery

## 2011-09-12 DIAGNOSIS — Z1231 Encounter for screening mammogram for malignant neoplasm of breast: Secondary | ICD-10-CM

## 2011-09-17 ENCOUNTER — Telehealth (INDEPENDENT_AMBULATORY_CARE_PROVIDER_SITE_OTHER): Payer: Self-pay | Admitting: General Surgery

## 2011-09-17 ENCOUNTER — Other Ambulatory Visit (INDEPENDENT_AMBULATORY_CARE_PROVIDER_SITE_OTHER): Payer: Self-pay | Admitting: General Surgery

## 2011-09-17 ENCOUNTER — Telehealth (INDEPENDENT_AMBULATORY_CARE_PROVIDER_SITE_OTHER): Payer: Self-pay

## 2011-09-17 DIAGNOSIS — R928 Other abnormal and inconclusive findings on diagnostic imaging of breast: Secondary | ICD-10-CM

## 2011-09-17 NOTE — Telephone Encounter (Signed)
Notified the pt's husband that Ms. Novell's mgm showed abnormality.  She is scheduled for a diagnostic mgm and [place] of left breast on 09/25/11 at [facility].  I told him I would make Dr. Zella Richer aware.

## 2011-09-17 NOTE — Telephone Encounter (Signed)
Pt calling for results of her mammogram and states she did not get an ultrasound that day.  She states that someone has called her, but cannot say why or what they said.

## 2011-09-25 ENCOUNTER — Ambulatory Visit
Admission: RE | Admit: 2011-09-25 | Discharge: 2011-09-25 | Disposition: A | Discharge status: Home / Self Care | Payer: Medicare Other | Source: Ambulatory Visit | Attending: General Surgery | Admitting: General Surgery

## 2011-09-25 DIAGNOSIS — R928 Other abnormal and inconclusive findings on diagnostic imaging of breast: Secondary | ICD-10-CM | POA: Diagnosis not present

## 2011-10-01 ENCOUNTER — Other Ambulatory Visit (HOSPITAL_BASED_OUTPATIENT_CLINIC_OR_DEPARTMENT_OTHER): Payer: Medicare Other | Admitting: Lab

## 2011-10-01 ENCOUNTER — Ambulatory Visit (HOSPITAL_BASED_OUTPATIENT_CLINIC_OR_DEPARTMENT_OTHER): Payer: Medicare Other

## 2011-10-01 VITALS — BP 123/61 | HR 66 | Temp 97.3°F

## 2011-10-01 DIAGNOSIS — D638 Anemia in other chronic diseases classified elsewhere: Secondary | ICD-10-CM

## 2011-10-01 LAB — CBC WITH DIFFERENTIAL/PLATELET
BASO%: 0.4 % (ref 0.0–2.0)
Basophils Absolute: 0 10*3/uL (ref 0.0–0.1)
EOS%: 5.8 % (ref 0.0–7.0)
Eosinophils Absolute: 0.3 10*3/uL (ref 0.0–0.5)
HCT: 29.5 % — ABNORMAL LOW (ref 34.8–46.6)
HGB: 9.3 g/dL — ABNORMAL LOW (ref 11.6–15.9)
LYMPH%: 27.1 % (ref 14.0–49.7)
MCH: 26.1 pg (ref 25.1–34.0)
MCHC: 31.6 g/dL (ref 31.5–36.0)
MCV: 82.5 fL (ref 79.5–101.0)
MONO#: 0.3 10*3/uL (ref 0.1–0.9)
MONO%: 6.8 % (ref 0.0–14.0)
NEUT#: 3 10*3/uL (ref 1.5–6.5)
NEUT%: 59.9 % (ref 38.4–76.8)
Platelets: 251 10*3/uL (ref 145–400)
RBC: 3.57 10*6/uL — ABNORMAL LOW (ref 3.70–5.45)
RDW: 15.9 % — ABNORMAL HIGH (ref 11.2–14.5)
WBC: 5.1 10*3/uL (ref 3.9–10.3)
lymph#: 1.4 10*3/uL (ref 0.9–3.3)

## 2011-10-01 MED ORDER — DARBEPOETIN ALFA-POLYSORBATE 300 MCG/0.6ML IJ SOLN
300.0000 ug | Freq: Once | INTRAMUSCULAR | Status: AC
  Administered 2011-10-01: 300 ug via SUBCUTANEOUS
  Filled 2011-10-01: qty 0.6

## 2011-10-16 DIAGNOSIS — N949 Unspecified condition associated with female genital organs and menstrual cycle: Secondary | ICD-10-CM | POA: Diagnosis not present

## 2011-10-16 DIAGNOSIS — R350 Frequency of micturition: Secondary | ICD-10-CM | POA: Diagnosis not present

## 2011-10-16 DIAGNOSIS — R3915 Urgency of urination: Secondary | ICD-10-CM | POA: Diagnosis not present

## 2011-10-16 DIAGNOSIS — R109 Unspecified abdominal pain: Secondary | ICD-10-CM | POA: Diagnosis not present

## 2011-10-16 DIAGNOSIS — R102 Pelvic and perineal pain: Secondary | ICD-10-CM | POA: Insufficient documentation

## 2011-10-16 DIAGNOSIS — R300 Dysuria: Secondary | ICD-10-CM | POA: Diagnosis not present

## 2011-10-22 ENCOUNTER — Ambulatory Visit (HOSPITAL_BASED_OUTPATIENT_CLINIC_OR_DEPARTMENT_OTHER): Payer: Medicare Other

## 2011-10-22 ENCOUNTER — Other Ambulatory Visit (HOSPITAL_BASED_OUTPATIENT_CLINIC_OR_DEPARTMENT_OTHER): Payer: Medicare Other | Admitting: Lab

## 2011-10-22 VITALS — BP 131/67 | HR 68 | Temp 96.9°F

## 2011-10-22 DIAGNOSIS — D638 Anemia in other chronic diseases classified elsewhere: Secondary | ICD-10-CM

## 2011-10-22 LAB — CBC WITH DIFFERENTIAL/PLATELET
BASO%: 0.6 % (ref 0.0–2.0)
Basophils Absolute: 0 10*3/uL (ref 0.0–0.1)
EOS%: 10 % — ABNORMAL HIGH (ref 0.0–7.0)
Eosinophils Absolute: 0.5 10*3/uL (ref 0.0–0.5)
HCT: 34.2 % — ABNORMAL LOW (ref 34.8–46.6)
HGB: 10.6 g/dL — ABNORMAL LOW (ref 11.6–15.9)
LYMPH%: 22.1 % (ref 14.0–49.7)
MCH: 27.4 pg (ref 25.1–34.0)
MCHC: 31.1 g/dL — ABNORMAL LOW (ref 31.5–36.0)
MCV: 88 fL (ref 79.5–101.0)
MONO#: 0.3 10*3/uL (ref 0.1–0.9)
MONO%: 6.5 % (ref 0.0–14.0)
NEUT#: 3 10*3/uL (ref 1.5–6.5)
NEUT%: 60.8 % (ref 38.4–76.8)
Platelets: 256 10*3/uL (ref 145–400)
RBC: 3.88 10*6/uL (ref 3.70–5.45)
RDW: 19.5 % — ABNORMAL HIGH (ref 11.2–14.5)
WBC: 5 10*3/uL (ref 3.9–10.3)
lymph#: 1.1 10*3/uL (ref 0.9–3.3)

## 2011-10-22 MED ORDER — DARBEPOETIN ALFA-POLYSORBATE 500 MCG/ML IJ SOLN
300.0000 ug | Freq: Once | INTRAMUSCULAR | Status: AC
  Administered 2011-10-22: 300 ug via SUBCUTANEOUS
  Filled 2011-10-22: qty 1

## 2011-10-29 ENCOUNTER — Telehealth: Payer: Self-pay | Admitting: Oncology

## 2011-10-29 ENCOUNTER — Ambulatory Visit (HOSPITAL_BASED_OUTPATIENT_CLINIC_OR_DEPARTMENT_OTHER): Payer: Medicare Other | Admitting: Lab

## 2011-10-29 ENCOUNTER — Other Ambulatory Visit (HOSPITAL_BASED_OUTPATIENT_CLINIC_OR_DEPARTMENT_OTHER): Payer: Medicare Other | Admitting: Lab

## 2011-10-29 ENCOUNTER — Encounter: Payer: Self-pay | Admitting: Hematology and Oncology

## 2011-10-29 ENCOUNTER — Ambulatory Visit (HOSPITAL_BASED_OUTPATIENT_CLINIC_OR_DEPARTMENT_OTHER): Payer: Medicare Other | Admitting: Hematology and Oncology

## 2011-10-29 VITALS — BP 109/65 | HR 69 | Temp 97.0°F | Resp 18 | Ht 61.0 in | Wt 108.1 lb

## 2011-10-29 DIAGNOSIS — D539 Nutritional anemia, unspecified: Secondary | ICD-10-CM

## 2011-10-29 DIAGNOSIS — D638 Anemia in other chronic diseases classified elsewhere: Secondary | ICD-10-CM

## 2011-10-29 LAB — CBC WITH DIFFERENTIAL/PLATELET
BASO%: 0.4 % (ref 0.0–2.0)
Basophils Absolute: 0 10*3/uL (ref 0.0–0.1)
EOS%: 8.1 % — ABNORMAL HIGH (ref 0.0–7.0)
Eosinophils Absolute: 0.4 10*3/uL (ref 0.0–0.5)
HCT: 37.1 % (ref 34.8–46.6)
HGB: 11.6 g/dL (ref 11.6–15.9)
LYMPH%: 20.7 % (ref 14.0–49.7)
MCH: 27.7 pg (ref 25.1–34.0)
MCHC: 31.2 g/dL — ABNORMAL LOW (ref 31.5–36.0)
MCV: 88.9 fL (ref 79.5–101.0)
MONO#: 0.3 10*3/uL (ref 0.1–0.9)
MONO%: 6.3 % (ref 0.0–14.0)
NEUT#: 3.4 10*3/uL (ref 1.5–6.5)
NEUT%: 64.5 % (ref 38.4–76.8)
Platelets: 337 10*3/uL (ref 145–400)
RBC: 4.17 10*6/uL (ref 3.70–5.45)
RDW: 18.6 % — ABNORMAL HIGH (ref 11.2–14.5)
WBC: 5.3 10*3/uL (ref 3.9–10.3)
lymph#: 1.1 10*3/uL (ref 0.9–3.3)

## 2011-10-29 LAB — VITAMIN B12: Vitamin B-12: 1424 pg/mL — ABNORMAL HIGH (ref 211–911)

## 2011-10-29 NOTE — Telephone Encounter (Signed)
appts made and printed for pt pt sent back to the lab

## 2011-10-29 NOTE — Progress Notes (Signed)
This office note has been dictated.

## 2011-10-29 NOTE — Patient Instructions (Signed)
Abigail Hoover  [id number]  [facility] Discharge Instructions  RECOMMENDATIONS MADE BY THE CONSULTANT AND ANY TEST RESULTS WILL BE SENT TO YOUR REFERRING DOCTOR.   EXAM FINDINGS BY MD TODAY AND SIGNS AND SYMPTOMS TO REPORT TO CLINIC OR PRIMARY MD:   Your current list of medications are: Current Outpatient Prescriptions  Medication Sig Dispense Refill  . amLODipine (NORVASC) 10 MG tablet Take 10 mg by mouth daily.        Marland Kitchen aspirin 81 MG tablet Take 81 mg by mouth daily.        . Cholecalciferol (VITAMIN D3) 1000 UNITS CAPS Take 1,000 Units by mouth daily.      . clonazePAM (KLONOPIN) 1 MG tablet Take 1 mg by mouth as needed. Take daily as needed.       . colesevelam (WELCHOL) 625 MG tablet Take 3,750 mg by mouth daily.       . cyanocobalamin (CVS VITAMIN B12) 1000 MCG tablet Take 1 tablet (1,000 mcg total) by mouth daily.      Marland Kitchen dexlansoprazole (DEXILANT) 60 MG capsule Take 60 mg by mouth daily.        . ferrous sulfate 325 (65 FE) MG tablet Take 325 mg by mouth 3 (three) times daily with meals.       . gabapentin (NEURONTIN) 300 MG capsule Take 300 mg by mouth 3 (three) times daily.       Marland Kitchen levothyroxine (SYNTHROID) 75 MCG tablet Take 88 mcg by mouth daily.       Marland Kitchen losartan-hydrochlorothiazide (HYZAAR) 100-25 MG per tablet Take 1 tablet by mouth daily.        . metoprolol (TOPROL-XL) 100 MG 24 hr tablet Take 100 mg by mouth daily.       . miglitol (GLYSET) 25 MG tablet Take 50 mg by mouth daily. One tablet by mouth once daily      . pentosan polysulfate (ELMIRON) 100 MG capsule Take 100 mg by mouth 3 (three) times daily before meals.       . sitaGLIPtan-metformin (JANUMET) 50-1000 MG per tablet Take 2 tablets by mouth daily.       Marland Kitchen triamcinolone (KENALOG) 0.1 % cream          INSTRUCTIONS GIVEN AND DISCUSSED:   SPECIAL INSTRUCTIONS/FOLLOW-UP:  See above.  I acknowledge that I have been informed and understand all the instructions given to me and received a copy. I  do not have any more questions at this time, but understand that I may call the [facility] at [phone] during business hours should I have any further questions or need assistance in obtaining follow-up care.

## 2011-10-29 NOTE — Progress Notes (Signed)
CC:   Abigail Vennie Homans, MD, Abigail Hoover, M.D.  IDENTIFYING STATEMENT:  The patient is a 66 year old woman with anemia of chronic disease who presents for followup.  INTERVAL HISTORY:  Abigail Hoover is tolerating Aranesp given every 3 weeks beginning September 05, 2010.  Blood pressure is well controlled.  She is eating well.  Her weight is stable.  She is a vegetarian.  Takes B12 orally daily.  CBC on 10/29/2011, white cell count 5.3, hemoglobin 11.6, hematocrit 37.1, platelets 337.  MEDICATIONS:  Aranesp 300 mcg every 3 weeks.  Rest of medications reviewed and updated.  ALLERGIES:  Atorvastatin.  PHYSICAL EXAM:  Patient is alert and oriented x3.  Vitals:  Pulse 69, blood pressure 109/65, temperature 97, respirations 18, weight 108.1 pounds.  HEENT:  Head is atraumatic, normocephalic.  Sclerae anicteric. Mouth moist.  Chest/CVS:  Unremarkable.  Abdomen:  Soft, nontender. Bowel sounds present.  Extremities:  No calf tenderness.  LAB DATA:  As above.  IMPRESSION AND PLAN:  Abigail Hoover a 67 year old woman with anemia of chronic disease.  She is supported with Aranesp currently dosed at 300 mcg every 3 weeks.  She will continue.  We will check B12 level.  She follows up in 4 months' time.    ______________________________ Nira Retort, M.D. LIO/MEDQ  D:  10/29/2011  T:  10/29/2011  Job:  [id number]

## 2011-10-30 ENCOUNTER — Telehealth: Payer: Self-pay | Admitting: *Deleted

## 2011-10-30 NOTE — Telephone Encounter (Signed)
Called pt at home and left message on voice mail re:  Vitamin B12  Is adequate  As per md's instructions.

## 2011-11-06 DIAGNOSIS — L2089 Other atopic dermatitis: Secondary | ICD-10-CM | POA: Diagnosis not present

## 2011-11-13 ENCOUNTER — Encounter (INDEPENDENT_AMBULATORY_CARE_PROVIDER_SITE_OTHER): Payer: Medicare Other | Admitting: Ophthalmology

## 2011-11-13 DIAGNOSIS — E11319 Type 2 diabetes mellitus with unspecified diabetic retinopathy without macular edema: Secondary | ICD-10-CM

## 2011-11-13 DIAGNOSIS — I10 Essential (primary) hypertension: Secondary | ICD-10-CM

## 2011-11-13 DIAGNOSIS — H43819 Vitreous degeneration, unspecified eye: Secondary | ICD-10-CM | POA: Diagnosis not present

## 2011-11-13 DIAGNOSIS — H35039 Hypertensive retinopathy, unspecified eye: Secondary | ICD-10-CM

## 2011-11-13 DIAGNOSIS — E1139 Type 2 diabetes mellitus with other diabetic ophthalmic complication: Secondary | ICD-10-CM | POA: Diagnosis not present

## 2011-11-19 ENCOUNTER — Other Ambulatory Visit (HOSPITAL_BASED_OUTPATIENT_CLINIC_OR_DEPARTMENT_OTHER): Payer: Medicare Other | Admitting: Lab

## 2011-11-19 ENCOUNTER — Ambulatory Visit: Payer: Medicare Other

## 2011-11-19 DIAGNOSIS — D638 Anemia in other chronic diseases classified elsewhere: Secondary | ICD-10-CM | POA: Diagnosis not present

## 2011-11-19 LAB — CBC WITH DIFFERENTIAL/PLATELET
BASO%: 0.3 % (ref 0.0–2.0)
Basophils Absolute: 0 10*3/uL (ref 0.0–0.1)
EOS%: 6 % (ref 0.0–7.0)
Eosinophils Absolute: 0.4 10*3/uL (ref 0.0–0.5)
HCT: 37.4 % (ref 34.8–46.6)
HGB: 11.7 g/dL (ref 11.6–15.9)
LYMPH%: 20.5 % (ref 14.0–49.7)
MCH: 28.1 pg (ref 25.1–34.0)
MCHC: 31.4 g/dL — ABNORMAL LOW (ref 31.5–36.0)
MCV: 89.6 fL (ref 79.5–101.0)
MONO#: 0.4 10*3/uL (ref 0.1–0.9)
MONO%: 6 % (ref 0.0–14.0)
NEUT#: 4.1 10*3/uL (ref 1.5–6.5)
NEUT%: 67.2 % (ref 38.4–76.8)
Platelets: 223 10*3/uL (ref 145–400)
RBC: 4.17 10*6/uL (ref 3.70–5.45)
RDW: 15 % — ABNORMAL HIGH (ref 11.2–14.5)
WBC: 6.2 10*3/uL (ref 3.9–10.3)
lymph#: 1.3 10*3/uL (ref 0.9–3.3)

## 2011-11-19 MED ORDER — DARBEPOETIN ALFA-POLYSORBATE 300 MCG/0.6ML IJ SOLN: 300.0000 ug | Freq: Once | INTRAMUSCULAR | Status: DC

## 2011-11-26 ENCOUNTER — Telehealth: Payer: Self-pay | Admitting: Pulmonary Disease

## 2011-11-26 NOTE — Telephone Encounter (Signed)
ok 

## 2011-11-26 NOTE — Telephone Encounter (Signed)
I spoke with pt and she c/o swelling and burning sensation in both of her legs x 4-5 days now. She is only wanting to see Dr. Elsworth Soho. Is it okay to overbook you on Thursday. Please advise thanks

## 2011-11-26 NOTE — Telephone Encounter (Signed)
Pt is scheduled to come in 11/28/11 at 1:30

## 2011-11-28 ENCOUNTER — Ambulatory Visit (INDEPENDENT_AMBULATORY_CARE_PROVIDER_SITE_OTHER)
Admission: RE | Admit: 2011-11-28 | Discharge: 2011-11-28 | Disposition: A | Discharge status: Home / Self Care | Payer: Medicare Other | Source: Ambulatory Visit | Attending: Pulmonary Disease | Admitting: Pulmonary Disease

## 2011-11-28 ENCOUNTER — Encounter: Payer: Self-pay | Admitting: Pulmonary Disease

## 2011-11-28 ENCOUNTER — Ambulatory Visit (INDEPENDENT_AMBULATORY_CARE_PROVIDER_SITE_OTHER): Payer: Medicare Other | Admitting: Pulmonary Disease

## 2011-11-28 ENCOUNTER — Ambulatory Visit: Payer: Medicare Other | Admitting: Pulmonary Disease

## 2011-11-28 VITALS — BP 100/62 | HR 61 | Temp 97.6°F | Ht 61.0 in | Wt 107.4 lb

## 2011-11-28 DIAGNOSIS — R609 Edema, unspecified: Secondary | ICD-10-CM | POA: Diagnosis not present

## 2011-11-28 DIAGNOSIS — R634 Abnormal weight loss: Secondary | ICD-10-CM

## 2011-11-28 DIAGNOSIS — J811 Chronic pulmonary edema: Secondary | ICD-10-CM | POA: Diagnosis not present

## 2011-11-28 MED ORDER — FUROSEMIDE 20 MG PO TABS: 20.0000 mg | ORAL_TABLET | Freq: Two times a day (BID) | ORAL | Status: DC

## 2011-11-28 NOTE — Assessment & Plan Note (Signed)
Take lasix 20 mg daily x 1 weeks , then stop & start this again if wt > 115 lbs or increased edema 1 refill given Limb elevation, compression hose

## 2011-11-28 NOTE — Patient Instructions (Addendum)
Lasix 20 mg daily x 15 tabs x 1 refill Low salt diet Keep legs elevated during sleep & rest, wear compression hose daily Chest xray today

## 2011-11-28 NOTE — Assessment & Plan Note (Signed)
CXR today.  

## 2011-11-28 NOTE — Progress Notes (Signed)
  Subjective:    Patient ID: Abigail Hoover, female    DOB: 08-24-1945, 66 y.o.   MRN: [id number]  HPI  66/F, diabetic, non smoker for FU of pedal edema  pedal edema is long standing, this precedes norvasc use  Adm 10/20- 28/10 for L1-3 compression fracture after an accidental fall. CXR suggested pulmonary edema & CT chest showed BL patchy air space opacities & BL effusions. Echo showed EF 55-60%, cardiac cath showed nml coronaries, BNP was 156. E. coli UTI was treated with keflex & LFTs normalised. BP meds were changed -tekturna, hydrallazine were stopped, Maxzide was started & [place] met dose was increased. Of note , other labs - Hb 9.8 attributed to iron deficiency, TSh 3.1, alb 2.7, UA prot neg   February 14, 2009 duplex neg for DVT  BNP low 10/11    05/16/2011  lasix 20 mg qd x 2 weeks called in 2/13  Sees  Dr Jamse Arn for anemia of chronic disease - aranesp q 3ks & B12  wt has dropped to 116lbs, is watching salt in her diet   11/28/2011 C/o increasing pedal edema Had stopped norvasc - BP increased to 180/100 & restarted (ganji) Wt dropped from 116 to 107 NO dyspnea, cough or wheeze.      Review of Systems neg for any significant sore throat, dysphagia, itching, sneezing, nasal congestion or excess/ purulent secretions, fever, chills, sweats, unintended wt loss, pleuritic or exertional cp, hempoptysis, orthopnea pnd or change in chronic leg swelling. Also denies presyncope, palpitations, heartburn, abdominal pain, nausea, vomiting, diarrhea or change in bowel or urinary habits, dysuria,hematuria, rash, arthralgias, visual complaints, headache, numbness weakness or ataxia.     Objective:   Physical Exam  Gen. Pleasant, well-nourished, in no distress ENT - no lesions, no post nasal drip Neck: No JVD, no thyromegaly, no carotid bruits Lungs: no use of accessory muscles, no dullness to percussion, clear without rales or rhonchi  Cardiovascular: Rhythm regular, heart sounds   normal, no murmurs or gallops, 2+ peripheral edema Musculoskeletal: No deformities, no cyanosis or clubbing, pigmentation over arms        Assessment & Plan:

## 2011-12-04 DIAGNOSIS — K146 Glossodynia: Secondary | ICD-10-CM | POA: Diagnosis not present

## 2011-12-10 ENCOUNTER — Ambulatory Visit: Payer: Medicare Other

## 2011-12-10 ENCOUNTER — Other Ambulatory Visit (HOSPITAL_BASED_OUTPATIENT_CLINIC_OR_DEPARTMENT_OTHER): Payer: Medicare Other | Admitting: Lab

## 2011-12-10 DIAGNOSIS — D638 Anemia in other chronic diseases classified elsewhere: Secondary | ICD-10-CM | POA: Diagnosis not present

## 2011-12-10 LAB — CBC WITH DIFFERENTIAL/PLATELET
BASO%: 0.5 % (ref 0.0–2.0)
Basophils Absolute: 0 10*3/uL (ref 0.0–0.1)
EOS%: 2.2 % (ref 0.0–7.0)
Eosinophils Absolute: 0.1 10*3/uL (ref 0.0–0.5)
HCT: 38.7 % (ref 34.8–46.6)
HGB: 12.6 g/dL (ref 11.6–15.9)
LYMPH%: 21.4 % (ref 14.0–49.7)
MCH: 28 pg (ref 25.1–34.0)
MCHC: 32.5 g/dL (ref 31.5–36.0)
MCV: 86.1 fL (ref 79.5–101.0)
MONO#: 0.4 10*3/uL (ref 0.1–0.9)
MONO%: 6.5 % (ref 0.0–14.0)
NEUT#: 3.9 10*3/uL (ref 1.5–6.5)
NEUT%: 69.4 % (ref 38.4–76.8)
Platelets: 224 10*3/uL (ref 145–400)
RBC: 4.49 10*6/uL (ref 3.70–5.45)
RDW: 13.8 % (ref 11.2–14.5)
WBC: 5.6 10*3/uL (ref 3.9–10.3)
lymph#: 1.2 10*3/uL (ref 0.9–3.3)

## 2011-12-10 MED ORDER — DARBEPOETIN ALFA-POLYSORBATE 300 MCG/0.6ML IJ SOLN: 300.0000 ug | Freq: Once | INTRAMUSCULAR | Status: DC

## 2011-12-11 ENCOUNTER — Other Ambulatory Visit: Payer: Self-pay | Admitting: Gastroenterology

## 2011-12-11 DIAGNOSIS — R634 Abnormal weight loss: Secondary | ICD-10-CM

## 2011-12-11 DIAGNOSIS — K219 Gastro-esophageal reflux disease without esophagitis: Secondary | ICD-10-CM | POA: Diagnosis not present

## 2011-12-11 DIAGNOSIS — Z1211 Encounter for screening for malignant neoplasm of colon: Secondary | ICD-10-CM | POA: Diagnosis not present

## 2011-12-11 DIAGNOSIS — K319 Disease of stomach and duodenum, unspecified: Secondary | ICD-10-CM | POA: Diagnosis not present

## 2011-12-11 DIAGNOSIS — D509 Iron deficiency anemia, unspecified: Secondary | ICD-10-CM | POA: Diagnosis not present

## 2011-12-12 ENCOUNTER — Ambulatory Visit
Admission: RE | Admit: 2011-12-12 | Discharge: 2011-12-12 | Disposition: A | Discharge status: Home / Self Care | Payer: Medicare Other | Source: Ambulatory Visit | Attending: Gastroenterology | Admitting: Gastroenterology

## 2011-12-12 ENCOUNTER — Other Ambulatory Visit: Payer: Medicare Other

## 2011-12-12 DIAGNOSIS — R630 Anorexia: Secondary | ICD-10-CM | POA: Diagnosis not present

## 2011-12-12 DIAGNOSIS — R634 Abnormal weight loss: Secondary | ICD-10-CM

## 2011-12-12 MED ORDER — IOHEXOL 300 MG/ML  SOLN
100.0000 mL | Freq: Once | INTRAMUSCULAR | Status: AC | PRN
  Administered 2011-12-12: 100 mL via INTRAVENOUS

## 2011-12-27 DIAGNOSIS — E785 Hyperlipidemia, unspecified: Secondary | ICD-10-CM | POA: Diagnosis not present

## 2011-12-27 DIAGNOSIS — E039 Hypothyroidism, unspecified: Secondary | ICD-10-CM | POA: Diagnosis not present

## 2011-12-27 DIAGNOSIS — E119 Type 2 diabetes mellitus without complications: Secondary | ICD-10-CM | POA: Diagnosis not present

## 2011-12-31 ENCOUNTER — Ambulatory Visit: Payer: Medicare Other

## 2011-12-31 ENCOUNTER — Other Ambulatory Visit (HOSPITAL_BASED_OUTPATIENT_CLINIC_OR_DEPARTMENT_OTHER): Payer: Medicare Other | Admitting: Lab

## 2011-12-31 DIAGNOSIS — D638 Anemia in other chronic diseases classified elsewhere: Secondary | ICD-10-CM | POA: Diagnosis not present

## 2011-12-31 LAB — CBC WITH DIFFERENTIAL/PLATELET
BASO%: 0.2 % (ref 0.0–2.0)
Basophils Absolute: 0 10*3/uL (ref 0.0–0.1)
EOS%: 2.2 % (ref 0.0–7.0)
Eosinophils Absolute: 0.1 10*3/uL (ref 0.0–0.5)
HCT: 36.8 % (ref 34.8–46.6)
HGB: 11.5 g/dL — ABNORMAL LOW (ref 11.6–15.9)
LYMPH%: 22.9 % (ref 14.0–49.7)
MCH: 26.6 pg (ref 25.1–34.0)
MCHC: 31.3 g/dL — ABNORMAL LOW (ref 31.5–36.0)
MCV: 85 fL (ref 79.5–101.0)
MONO#: 0.3 10*3/uL (ref 0.1–0.9)
MONO%: 5.3 % (ref 0.0–14.0)
NEUT#: 3.4 10*3/uL (ref 1.5–6.5)
NEUT%: 69.4 % (ref 38.4–76.8)
Platelets: 229 10*3/uL (ref 145–400)
RBC: 4.33 10*6/uL (ref 3.70–5.45)
RDW: 13.3 % (ref 11.2–14.5)
WBC: 4.9 10*3/uL (ref 3.9–10.3)
lymph#: 1.1 10*3/uL (ref 0.9–3.3)

## 2011-12-31 MED ORDER — DARBEPOETIN ALFA-POLYSORBATE 300 MCG/0.6ML IJ SOLN: 300.0000 ug | Freq: Once | INTRAMUSCULAR | Status: DC

## 2012-01-01 DIAGNOSIS — E119 Type 2 diabetes mellitus without complications: Secondary | ICD-10-CM | POA: Diagnosis not present

## 2012-01-01 DIAGNOSIS — R634 Abnormal weight loss: Secondary | ICD-10-CM | POA: Diagnosis not present

## 2012-01-01 DIAGNOSIS — Z23 Encounter for immunization: Secondary | ICD-10-CM | POA: Diagnosis not present

## 2012-01-01 DIAGNOSIS — E039 Hypothyroidism, unspecified: Secondary | ICD-10-CM | POA: Diagnosis not present

## 2012-01-01 DIAGNOSIS — E871 Hypo-osmolality and hyponatremia: Secondary | ICD-10-CM | POA: Diagnosis not present

## 2012-01-03 DIAGNOSIS — R634 Abnormal weight loss: Secondary | ICD-10-CM | POA: Diagnosis not present

## 2012-01-14 DIAGNOSIS — R059 Cough, unspecified: Secondary | ICD-10-CM | POA: Diagnosis not present

## 2012-01-14 DIAGNOSIS — R05 Cough: Secondary | ICD-10-CM | POA: Diagnosis not present

## 2012-01-21 ENCOUNTER — Ambulatory Visit: Payer: Medicare Other

## 2012-01-21 ENCOUNTER — Other Ambulatory Visit (HOSPITAL_BASED_OUTPATIENT_CLINIC_OR_DEPARTMENT_OTHER): Payer: Medicare Other | Admitting: Lab

## 2012-01-21 DIAGNOSIS — D638 Anemia in other chronic diseases classified elsewhere: Secondary | ICD-10-CM

## 2012-01-21 LAB — CBC WITH DIFFERENTIAL/PLATELET
BASO%: 0.4 % (ref 0.0–2.0)
Basophils Absolute: 0 10*3/uL (ref 0.0–0.1)
EOS%: 0.9 % (ref 0.0–7.0)
Eosinophils Absolute: 0.1 10*3/uL (ref 0.0–0.5)
HCT: 35 % (ref 34.8–46.6)
HGB: 11.5 g/dL — ABNORMAL LOW (ref 11.6–15.9)
LYMPH%: 20.1 % (ref 14.0–49.7)
MCH: 27.4 pg (ref 25.1–34.0)
MCHC: 32.7 g/dL (ref 31.5–36.0)
MCV: 83.7 fL (ref 79.5–101.0)
MONO#: 0.4 10*3/uL (ref 0.1–0.9)
MONO%: 6 % (ref 0.0–14.0)
NEUT#: 4.8 10*3/uL (ref 1.5–6.5)
NEUT%: 72.6 % (ref 38.4–76.8)
Platelets: 226 10*3/uL (ref 145–400)
RBC: 4.18 10*6/uL (ref 3.70–5.45)
RDW: 13.9 % (ref 11.2–14.5)
WBC: 6.6 10*3/uL (ref 3.9–10.3)
lymph#: 1.3 10*3/uL (ref 0.9–3.3)

## 2012-01-21 MED ORDER — DARBEPOETIN ALFA-POLYSORBATE 300 MCG/0.6ML IJ SOLN: 300.0000 ug | Freq: Once | INTRAMUSCULAR | Status: DC

## 2012-01-28 DIAGNOSIS — R634 Abnormal weight loss: Secondary | ICD-10-CM | POA: Diagnosis not present

## 2012-02-07 ENCOUNTER — Telehealth: Payer: Self-pay | Admitting: Medical Oncology

## 2012-02-07 NOTE — Telephone Encounter (Signed)
Returned call

## 2012-02-11 ENCOUNTER — Other Ambulatory Visit (HOSPITAL_BASED_OUTPATIENT_CLINIC_OR_DEPARTMENT_OTHER): Payer: Medicare Other | Admitting: Lab

## 2012-02-11 ENCOUNTER — Ambulatory Visit (HOSPITAL_BASED_OUTPATIENT_CLINIC_OR_DEPARTMENT_OTHER): Payer: Medicare Other

## 2012-02-11 VITALS — BP 136/58 | HR 69 | Temp 97.0°F

## 2012-02-11 DIAGNOSIS — D638 Anemia in other chronic diseases classified elsewhere: Secondary | ICD-10-CM | POA: Diagnosis not present

## 2012-02-11 LAB — CBC WITH DIFFERENTIAL/PLATELET
BASO%: 0.2 % (ref 0.0–2.0)
Basophils Absolute: 0 10*3/uL (ref 0.0–0.1)
EOS%: 1.7 % (ref 0.0–7.0)
Eosinophils Absolute: 0.1 10*3/uL (ref 0.0–0.5)
HCT: 28.4 % — ABNORMAL LOW (ref 34.8–46.6)
HGB: 9.3 g/dL — ABNORMAL LOW (ref 11.6–15.9)
LYMPH%: 17.4 % (ref 14.0–49.7)
MCH: 27.6 pg (ref 25.1–34.0)
MCHC: 32.7 g/dL (ref 31.5–36.0)
MCV: 84.3 fL (ref 79.5–101.0)
MONO#: 0.3 10*3/uL (ref 0.1–0.9)
MONO%: 5.1 % (ref 0.0–14.0)
NEUT#: 4.8 10*3/uL (ref 1.5–6.5)
NEUT%: 75.6 % (ref 38.4–76.8)
Platelets: 321 10*3/uL (ref 145–400)
RBC: 3.37 10*6/uL — ABNORMAL LOW (ref 3.70–5.45)
RDW: 14.8 % — ABNORMAL HIGH (ref 11.2–14.5)
WBC: 6.3 10*3/uL (ref 3.9–10.3)
lymph#: 1.1 10*3/uL (ref 0.9–3.3)

## 2012-02-11 MED ORDER — DARBEPOETIN ALFA-POLYSORBATE 300 MCG/0.6ML IJ SOLN
300.0000 ug | Freq: Once | INTRAMUSCULAR | Status: AC
  Administered 2012-02-11: 300 ug via SUBCUTANEOUS
  Filled 2012-02-11: qty 0.6

## 2012-02-25 ENCOUNTER — Ambulatory Visit (INDEPENDENT_AMBULATORY_CARE_PROVIDER_SITE_OTHER): Payer: Medicare Other | Admitting: General Surgery

## 2012-02-28 ENCOUNTER — Ambulatory Visit: Payer: Medicare Other | Admitting: Hematology and Oncology

## 2012-02-28 ENCOUNTER — Other Ambulatory Visit: Payer: Medicare Other | Admitting: Lab

## 2012-03-02 ENCOUNTER — Other Ambulatory Visit: Payer: Self-pay

## 2012-03-02 DIAGNOSIS — D539 Nutritional anemia, unspecified: Secondary | ICD-10-CM

## 2012-03-02 DIAGNOSIS — D638 Anemia in other chronic diseases classified elsewhere: Secondary | ICD-10-CM

## 2012-03-03 ENCOUNTER — Encounter: Payer: Self-pay | Admitting: Hematology and Oncology

## 2012-03-03 ENCOUNTER — Ambulatory Visit (HOSPITAL_BASED_OUTPATIENT_CLINIC_OR_DEPARTMENT_OTHER): Payer: Medicare Other

## 2012-03-03 ENCOUNTER — Telehealth: Payer: Self-pay | Admitting: Hematology and Oncology

## 2012-03-03 ENCOUNTER — Ambulatory Visit (HOSPITAL_BASED_OUTPATIENT_CLINIC_OR_DEPARTMENT_OTHER): Payer: Medicare Other | Admitting: Hematology and Oncology

## 2012-03-03 ENCOUNTER — Other Ambulatory Visit (HOSPITAL_BASED_OUTPATIENT_CLINIC_OR_DEPARTMENT_OTHER): Payer: Medicare Other | Admitting: Lab

## 2012-03-03 VITALS — BP 122/60 | HR 68 | Temp 97.2°F | Ht 59.0 in | Wt 107.1 lb

## 2012-03-03 DIAGNOSIS — D638 Anemia in other chronic diseases classified elsewhere: Secondary | ICD-10-CM

## 2012-03-03 DIAGNOSIS — D539 Nutritional anemia, unspecified: Secondary | ICD-10-CM | POA: Diagnosis not present

## 2012-03-03 LAB — CBC WITH DIFFERENTIAL/PLATELET
BASO%: 0.3 % (ref 0.0–2.0)
Basophils Absolute: 0 10*3/uL (ref 0.0–0.1)
EOS%: 1 % (ref 0.0–7.0)
Eosinophils Absolute: 0.1 10*3/uL (ref 0.0–0.5)
HCT: 34.7 % — ABNORMAL LOW (ref 34.8–46.6)
HGB: 10.9 g/dL — ABNORMAL LOW (ref 11.6–15.9)
LYMPH%: 10.9 % — ABNORMAL LOW (ref 14.0–49.7)
MCH: 27.7 pg (ref 25.1–34.0)
MCHC: 31.4 g/dL — ABNORMAL LOW (ref 31.5–36.0)
MCV: 88.1 fL (ref 79.5–101.0)
MONO#: 0.4 10*3/uL (ref 0.1–0.9)
MONO%: 6.4 % (ref 0.0–14.0)
NEUT#: 5.2 10*3/uL (ref 1.5–6.5)
NEUT%: 81.4 % — ABNORMAL HIGH (ref 38.4–76.8)
Platelets: 356 10*3/uL (ref 145–400)
RBC: 3.94 10*6/uL (ref 3.70–5.45)
RDW: 17.3 % — ABNORMAL HIGH (ref 11.2–14.5)
WBC: 6.4 10*3/uL (ref 3.9–10.3)
lymph#: 0.7 10*3/uL — ABNORMAL LOW (ref 0.9–3.3)

## 2012-03-03 LAB — VITAMIN B12: Vitamin B-12: 1059 pg/mL — ABNORMAL HIGH (ref 211–911)

## 2012-03-03 MED ORDER — DARBEPOETIN ALFA-POLYSORBATE 300 MCG/0.6ML IJ SOLN
300.0000 ug | Freq: Once | INTRAMUSCULAR | Status: AC
  Administered 2012-03-03: 300 ug via SUBCUTANEOUS
  Filled 2012-03-03: qty 0.6

## 2012-03-03 NOTE — Patient Instructions (Addendum)
[name]  [id number]   [facility] - AFTER VISIT SUMMARY   **RECOMMENDATIONS MADE BY THE CONSULTANT AND ANY TEST    RESULTS WILL BE SENT TO YOUR REFERRING DOCTORS.   YOUR EXAM FINDINGS, LABS AND RESULTS WERE DISCUSSED BY YOUR MD TODAY.  YOU CAN GO TO THE [facility] WEB SITE FOR INSTRUCTIONS ON HOW TO ASSESS MY CHART FOR ADDITIONAL INFORMATION AS NEEDED.  Your Updated drug allergies are: Allergies as of 03/03/2012 - Review Complete 03/03/2012  Allergen Reaction Noted  . Ezetimibe Itching   . Atorvastatin      Your current list of medications are: Current Outpatient Prescriptions  Medication Sig Dispense Refill  . amLODipine (NORVASC) 10 MG tablet Take 10 mg by mouth daily.        Marland Kitchen aspirin 81 MG tablet Take 81 mg by mouth daily.        . Cholecalciferol (VITAMIN D3) 1000 UNITS CAPS Take 1,000 Units by mouth daily.      . clonazePAM (KLONOPIN) 1 MG tablet Take 1 mg by mouth as needed. Take daily as needed.       . colesevelam (WELCHOL) 625 MG tablet Take 3,750 mg by mouth daily.       . cyanocobalamin ([facility]S VITAMIN B12) 1000 MCG tablet Take 1 tablet (1,000 mcg total) by mouth daily.      Marland Kitchen dexlansoprazole (DEXILANT) 60 MG capsule Take 60 mg by mouth daily.        . ferrous sulfate 325 (65 FE) MG tablet Take 325 mg by mouth 3 (three) times daily with meals.       . furosemide (LASIX) 20 MG tablet Take 1 tablet (20 mg total) by mouth 2 (two) times daily.  15 tablet  1  . gabapentin (NEURONTIN) 300 MG capsule Take 300 mg by mouth 3 (three) times daily.       Marland Kitchen levothyroxine (SYNTHROID) 75 MCG tablet Take 88 mcg by mouth daily.       Marland Kitchen losartan-hydrochlorothiazide (HYZAAR) 100-25 MG per tablet Take 1 tablet by mouth daily.        . metoprolol (TOPROL-XL) 100 MG 24 hr tablet Take 100 mg by mouth daily.       . miglitol (GLYSET) 25 MG tablet Take 50 mg by mouth daily. One tablet by mouth once daily      . MYRBETRIQ 25 MG TB24 Take 1 tablet by mouth Daily.      .  pentosan polysulfate (ELMIRON) 100 MG capsule Take 100 mg by mouth 3 (three) times daily before meals.       . sitaGLIPtan-metformin (JANUMET) 50-1000 MG per tablet Take 2 tablets by mouth daily.        No current facility-administered medications for this visit.   Facility-Administered Medications Ordered in Other Visits  Medication Dose Route Frequency Provider Last Rate Last Dose  . [COMPLETED] darbepoetin (ARANESP) injection 300 mcg  300 mcg Subcutaneous Once Nira Retort, MD   300 mcg at 03/03/12 1200     INSTRUCTIONS GIVEN AND DISCUSSED:  See attached schedule   SPECIAL INSTRUCTIONS/FOLLOW-UP:  See above.  I acknowledge that I have been informed and understand all the instructions given to me and received a copy.I know to contact the clinic, my physician, or go to the emergency Department if any problems should occur.   I do not have any more questions at this time, but understand that I may call the [facility] at [phone]  during business hours should I have any further questions or need assistance in obtaining follow-up care.

## 2012-03-03 NOTE — Telephone Encounter (Signed)
gv and printed pt appt schedule for Dec thru April 2014.Marland KitchenMarland Kitchen

## 2012-03-03 NOTE — Progress Notes (Signed)
This office note has been dictated.

## 2012-03-03 NOTE — Progress Notes (Signed)
CC:   Abigail Vennie Homans, MD, Abigail Hoover, M.D.  HISTORY OF PRESENT ILLNESS:  The patient is a 66 year old woman with anemia of chronic disease who presents for followup.  INTERVAL HISTORY:  The patient has no current issues or concerns. Continues with Aranesp every 3 weeks.  Denies anorexia.  Takes B12 orally daily as she is a vegetarian.  CBC 12 12/2011 white cell count 6.4, hemoglobin 10.9, hematocrit 34.7, platelets 356.  MEDICATIONS:  Aranesp 300 mcg every 3 weeks.  Rest of medicines reviewed and updated.  ALLERGIES:  Atorvastatin.  PHYSICAL EXAM:  Vitals:  Pulse 68, blood pressure 120/60, temperature 97.2, respirations 20, weight 107 pounds.  HEENT:  Head is atraumatic, normocephalic.  Sclerae anicteric.  Mouth moist.  Chest/CVS: Unremarkable.  Abdomen:  Soft, nontender.  Bowel sounds present. Extremities:  No calf tenderness.  LABORATORY DATA:  CBC as above.  IMPRESSION AND PLAN:  Abigail Hoover Is a 66 year old woman with anemia of chronic disease.  She is supported with Aranesp at 300 mcg dosed every 3 weeks.  She will be leaving for [place] in the next couple of weeks.  When she returns she continues as scheduled and follows up in 4 months' time.    ______________________________ Nira Retort, M.D. LIO/MEDQ  D:  03/03/2012  T:  03/03/2012  Job:  [id number]

## 2012-03-06 DIAGNOSIS — E785 Hyperlipidemia, unspecified: Secondary | ICD-10-CM | POA: Diagnosis not present

## 2012-03-06 DIAGNOSIS — E039 Hypothyroidism, unspecified: Secondary | ICD-10-CM | POA: Diagnosis not present

## 2012-03-06 DIAGNOSIS — E119 Type 2 diabetes mellitus without complications: Secondary | ICD-10-CM | POA: Diagnosis not present

## 2012-03-10 ENCOUNTER — Encounter (HOSPITAL_COMMUNITY): Admission: RE | Admit: 2012-03-10 | Payer: Medicare Other | Source: Ambulatory Visit

## 2012-03-10 ENCOUNTER — Encounter (HOSPITAL_COMMUNITY)
Admission: RE | Admit: 2012-03-10 | Discharge: 2012-03-10 | Disposition: A | Discharge status: Home / Self Care | Payer: Medicare Other | Source: Ambulatory Visit | Attending: General Surgery | Admitting: General Surgery

## 2012-03-10 ENCOUNTER — Encounter (INDEPENDENT_AMBULATORY_CARE_PROVIDER_SITE_OTHER): Payer: Self-pay | Admitting: General Surgery

## 2012-03-10 ENCOUNTER — Other Ambulatory Visit (INDEPENDENT_AMBULATORY_CARE_PROVIDER_SITE_OTHER): Payer: Self-pay | Admitting: General Surgery

## 2012-03-10 ENCOUNTER — Ambulatory Visit (INDEPENDENT_AMBULATORY_CARE_PROVIDER_SITE_OTHER): Payer: Medicare Other | Admitting: General Surgery

## 2012-03-10 ENCOUNTER — Encounter (HOSPITAL_COMMUNITY): Payer: Self-pay

## 2012-03-10 VITALS — BP 120/70 | HR 68 | Temp 97.1°F | Resp 16 | Ht 61.0 in | Wt 105.1 lb

## 2012-03-10 DIAGNOSIS — Z90710 Acquired absence of both cervix and uterus: Secondary | ICD-10-CM | POA: Diagnosis not present

## 2012-03-10 DIAGNOSIS — K219 Gastro-esophageal reflux disease without esophagitis: Secondary | ICD-10-CM | POA: Diagnosis not present

## 2012-03-10 DIAGNOSIS — N63 Unspecified lump in unspecified breast: Secondary | ICD-10-CM | POA: Insufficient documentation

## 2012-03-10 DIAGNOSIS — D249 Benign neoplasm of unspecified breast: Secondary | ICD-10-CM | POA: Diagnosis not present

## 2012-03-10 DIAGNOSIS — M129 Arthropathy, unspecified: Secondary | ICD-10-CM | POA: Diagnosis not present

## 2012-03-10 DIAGNOSIS — Z833 Family history of diabetes mellitus: Secondary | ICD-10-CM | POA: Diagnosis not present

## 2012-03-10 DIAGNOSIS — Z7982 Long term (current) use of aspirin: Secondary | ICD-10-CM | POA: Diagnosis not present

## 2012-03-10 DIAGNOSIS — Z803 Family history of malignant neoplasm of breast: Secondary | ICD-10-CM | POA: Diagnosis not present

## 2012-03-10 DIAGNOSIS — Z823 Family history of stroke: Secondary | ICD-10-CM | POA: Diagnosis not present

## 2012-03-10 DIAGNOSIS — D509 Iron deficiency anemia, unspecified: Secondary | ICD-10-CM | POA: Diagnosis not present

## 2012-03-10 DIAGNOSIS — Z01818 Encounter for other preprocedural examination: Secondary | ICD-10-CM | POA: Diagnosis not present

## 2012-03-10 DIAGNOSIS — Z9849 Cataract extraction status, unspecified eye: Secondary | ICD-10-CM | POA: Diagnosis not present

## 2012-03-10 DIAGNOSIS — N641 Fat necrosis of breast: Secondary | ICD-10-CM | POA: Diagnosis not present

## 2012-03-10 DIAGNOSIS — Z8249 Family history of ischemic heart disease and other diseases of the circulatory system: Secondary | ICD-10-CM | POA: Diagnosis not present

## 2012-03-10 DIAGNOSIS — I10 Essential (primary) hypertension: Secondary | ICD-10-CM | POA: Diagnosis not present

## 2012-03-10 DIAGNOSIS — E119 Type 2 diabetes mellitus without complications: Secondary | ICD-10-CM | POA: Diagnosis not present

## 2012-03-10 DIAGNOSIS — E7800 Pure hypercholesterolemia, unspecified: Secondary | ICD-10-CM | POA: Diagnosis not present

## 2012-03-10 DIAGNOSIS — E079 Disorder of thyroid, unspecified: Secondary | ICD-10-CM | POA: Diagnosis not present

## 2012-03-10 HISTORY — DX: Psoriasis, unspecified: L40.9

## 2012-03-10 HISTORY — DX: Anxiety disorder, unspecified: F41.9

## 2012-03-10 HISTORY — DX: Hypothyroidism, unspecified: E03.9

## 2012-03-10 LAB — CBC WITH DIFFERENTIAL/PLATELET
Basophils Absolute: 0 10*3/uL (ref 0.0–0.1)
Basophils Relative: 0 % (ref 0–1)
Eosinophils Absolute: 0.1 10*3/uL (ref 0.0–0.7)
Eosinophils Relative: 2 % (ref 0–5)
HCT: 40.5 % (ref 36.0–46.0)
Hemoglobin: 12.6 g/dL (ref 12.0–15.0)
Lymphocytes Relative: 15 % (ref 12–46)
Lymphs Abs: 0.9 10*3/uL (ref 0.7–4.0)
MCH: 27.1 pg (ref 26.0–34.0)
MCHC: 31.1 g/dL (ref 30.0–36.0)
MCV: 87.1 fL (ref 78.0–100.0)
Monocytes Absolute: 0.3 10*3/uL (ref 0.1–1.0)
Monocytes Relative: 6 % (ref 3–12)
Neutro Abs: 4.7 10*3/uL (ref 1.7–7.7)
Neutrophils Relative %: 78 % — ABNORMAL HIGH (ref 43–77)
Platelets: 420 10*3/uL — ABNORMAL HIGH (ref 150–400)
RBC: 4.65 MIL/uL (ref 3.87–5.11)
RDW: 16.6 % — ABNORMAL HIGH (ref 11.5–15.5)
WBC: 6 10*3/uL (ref 4.0–10.5)

## 2012-03-10 LAB — COMPREHENSIVE METABOLIC PANEL
ALT: 32 U/L (ref 0–35)
AST: 35 U/L (ref 0–37)
Albumin: 4.3 g/dL (ref 3.5–5.2)
Alkaline Phosphatase: 87 U/L (ref 39–117)
BUN: 17 mg/dL (ref 6–23)
CO2: 25 mEq/L (ref 19–32)
Calcium: 10.7 mg/dL — ABNORMAL HIGH (ref 8.4–10.5)
Chloride: 97 mEq/L (ref 96–112)
Creatinine, Ser: 0.83 mg/dL (ref 0.50–1.10)
GFR calc Af Amer: 82 mL/min — ABNORMAL LOW (ref >90)
GFR calc non Af Amer: 71 mL/min — ABNORMAL LOW (ref >90)
Glucose, Bld: 156 mg/dL — ABNORMAL HIGH (ref 70–99)
Potassium: 4.9 mEq/L (ref 3.5–5.1)
Sodium: 135 mEq/L (ref 135–145)
Total Bilirubin: 0.4 mg/dL (ref 0.3–1.2)
Total Protein: 7.1 g/dL (ref 6.0–8.3)

## 2012-03-10 LAB — PROTIME-INR
INR: 1.04 (ref 0.00–1.49)
Prothrombin Time: 13.5 seconds (ref 11.6–15.2)

## 2012-03-10 LAB — SURGICAL PCR SCREEN
MRSA, PCR: NEGATIVE
Staphylococcus aureus: NEGATIVE

## 2012-03-10 MED ORDER — CEFAZOLIN SODIUM-DEXTROSE 2-3 GM-% IV SOLR
2.0000 g | INTRAVENOUS | Status: AC
  Administered 2012-03-11: 1 g via INTRAVENOUS
  Filled 2012-03-10: qty 50

## 2012-03-10 NOTE — Pre-Procedure Instructions (Signed)
20 Abigail Hoover  03/10/2012   Your procedure is scheduled on:  Wednesday, December 18th.  Report to [facility] at 1130 AM.  Call this number if you have problems the morning of surgery: [phone]   Remember: Nothing to eat or drink after Midnight.     Take these medicines the morning of surgery with A SIP OF WATER: Amlodipine (Norvasc),  Dexlansoprazole (Dexilant), Gabapentin (Neurontin), Levothyroxine (Synthyroid), Metoprolol (Toprol XL).  Stop taking Aspirin, Coumadin, Plavix, Effient and herbal medications.  Do not take any NSAIDS ie:  Ibuprofen, Advil, Naproxen.     Do not wear jewelry, make-up or nail polish.  Do not wear lotions, powders, or perfumes. You may wear deodorant.  Do not shave 48 hours prior to surgery. Men may shave face and neck.  Do not bring valuables to the hospital.  Contacts, dentures or bridgework may not be worn into surgery.  Leave suitcase in the car. After surgery it may be brought to your room.  For patients admitted to the hospital, checkout time is 11:00 AM the day of discharge.   Patients discharged the day of surgery will not be allowed to drive home.  Name and phone number of your driver: -    Special Instructions: Shower using CHG 2 nights before surgery and the night before surgery.  If you shower the day of surgery use CHG.  Use special wash - you have one bottle of CHG for all showers.  You should use approximately 1/3 of the bottle for each shower.   Please read over the following fact sheets that you were given: Pain Booklet, Coughing and Deep Breathing and Surgical Site Infection Prevention

## 2012-03-10 NOTE — Patient Instructions (Signed)
[facility] °Office Phone Number [phone] ° °BREAST BIOPSY/ PARTIAL MASTECTOMY: POST OP INSTRUCTIONS ° °Always review your discharge instruction sheet given to you by the facility where your surgery was performed. ° °IF YOU HAVE DISABILITY OR FAMILY LEAVE FORMS, YOU MUST BRING THEM TO THE OFFICE FOR PROCESSING.  DO NOT GIVE THEM TO YOUR DOCTOR. ° °1. A prescription for pain medication may be given to you upon discharge.  Take your pain medication as prescribed, if needed.  If narcotic pain medicine is not needed, then you may take acetaminophen (Tylenol) or ibuprofen (Advil) as needed. °2. Take your usually prescribed medications unless otherwise directed °3. If you need a refill on your pain medication, please contact your pharmacy.  They will contact our office to request authorization.  Prescriptions will not be filled after 5pm or on week-ends. °4. You should eat very light the first 24 hours after surgery, such as soup, crackers, pudding, etc.  Resume your normal diet the day after surgery. °5. Most patients will experience some swelling and bruising in the breast.  Ice packs and a good support bra will help.  Swelling and bruising can take several days to resolve.  °6. It is common to experience some constipation if taking pain medication after surgery.  Increasing fluid intake and taking a stool softener will usually help or prevent this problem from occurring.  A mild laxative (Milk of Magnesia or Miralax) should be taken according to package directions if there are no bowel movements after 48 hours. °7. Unless discharge instructions indicate otherwise, you may remove your bandages 24-48 hours after surgery, and you may shower at that time.  You may have steri-strips (small skin tapes) in place directly over the incision.  These strips should be left on the skin for 7-10 days.  If your surgeon used skin glue on the incision, you may shower in 24 hours.  The glue will flake off over the  next 2-3 weeks.  Any sutures or staples will be removed at the office during your follow-up visit. °8. ACTIVITIES:  You may resume regular daily activities (gradually increasing) beginning the next day.  Wearing a good support bra or sports bra minimizes pain and swelling.  You may have sexual intercourse when it is comfortable. °a. You may drive when you no longer are taking prescription pain medication, you can comfortably wear a seatbelt, and you can safely maneuver your car and apply brakes. °b. RETURN TO WORK:  ______________________________________________________________________________________ °9. You should see your doctor in the office for a follow-up appointment approximately two weeks after your surgery.  Your doctor’s nurse will typically make your follow-up appointment when she calls you with your pathology report.  Expect your pathology report 2-3 business days after your surgery.  You may call to check if you do not hear from us after three days. °10. OTHER INSTRUCTIONS: _______________________________________________________________________________________________ _____________________________________________________________________________________________________________________________________ °_____________________________________________________________________________________________________________________________________ °_____________________________________________________________________________________________________________________________________ ° °WHEN TO CALL YOUR DOCTOR: °1. Fever over 101.0 °2. Nausea and/or vomiting. °3. Extreme swelling or bruising. °4. Continued bleeding from incision. °5. Increased pain, redness, or drainage from the incision. ° °The clinic staff is available to answer your questions during regular business hours.  Please don’t hesitate to call and ask to speak to one of the nurses for clinical concerns.  If you have a medical emergency, go to the nearest  emergency room or call 911.  A surgeon from [facility] is always on call at the hospital. ° °For further questions, please visit [email or web address]  °

## 2012-03-10 NOTE — Progress Notes (Signed)
Chief Complaint  Patient presents with  . Follow-up    Family history of breast cancer    HPI Abigail Hoover is a 66 y.o. female.   HPI  Abigail Hoover is a patient in the high risk category for breast cancer and is here for her 6 month visit.  She has intermittent left breast pain.  Past Medical History  Diagnosis Date  . Diabetes mellitus   . Hypertension   . Thyroid disease   . Arthritis   . Anemia   . GERD (gastroesophageal reflux disease)   . Hypercholesterolemia   . Bladder irritation   . Iron deficiency anemia, unspecified     Past Surgical History  Procedure Date  . Abdominal hysterectomy     TAH  . Cervical disc surgery   . Cataract extraction   . Breast biopsy     Left breast biopsy benign lesion    Family History  Problem Relation Age of Onset  . Breast cancer Sister   . Heart disease Mother     heart attack  . Stroke Father     brain hemorrhage  . Diabetes Brother   . Cancer Brother     Social History History  Substance Use Topics  . Smoking status: Never Smoker   . Smokeless tobacco: Never Used  . Alcohol Use: No    Allergies  Allergen Reactions  . Ezetimibe Itching  . Atorvastatin     REACTION: rash    Current Outpatient Prescriptions  Medication Sig Dispense Refill  . dronabinol (MARINOL) 2.5 MG capsule Twice daily.      Marland Kitchen triamcinolone cream (KENALOG) 0.1 % as needed.      Marland Kitchen amLODipine (NORVASC) 10 MG tablet Take 10 mg by mouth daily.        Marland Kitchen aspirin 81 MG tablet Take 81 mg by mouth daily.        . Cholecalciferol (VITAMIN D3) 1000 UNITS CAPS Take 1,000 Units by mouth daily.      . clonazePAM (KLONOPIN) 1 MG tablet Take 1 mg by mouth as needed. Take daily as needed.       . colesevelam (WELCHOL) 625 MG tablet Take 3,750 mg by mouth daily.       . cyanocobalamin ([employer] VITAMIN B12) 1000 MCG tablet Take 1 tablet (1,000 mcg total) by mouth daily.      Marland Kitchen dexlansoprazole (DEXILANT) 60 MG capsule Take 60 mg by mouth daily.        . ferrous  sulfate 325 (65 FE) MG tablet Take 325 mg by mouth 3 (three) times daily with meals.       . furosemide (LASIX) 20 MG tablet Take 20 mg by mouth daily as needed.      . gabapentin (NEURONTIN) 300 MG capsule Take 300 mg by mouth 3 (three) times daily.       Marland Kitchen levothyroxine (SYNTHROID) 75 MCG tablet Take 88 mcg by mouth daily.       Marland Kitchen losartan-hydrochlorothiazide (HYZAAR) 100-25 MG per tablet Take 1 tablet by mouth daily.        . metoprolol (TOPROL-XL) 100 MG 24 hr tablet Take 100 mg by mouth daily.       . miglitol (GLYSET) 25 MG tablet Take 50 mg by mouth daily. One tablet by mouth once daily      . MYRBETRIQ 25 MG TB24 Take 1 tablet by mouth Daily.      . pentosan polysulfate (ELMIRON) 100 MG capsule Take 100 mg by  mouth 3 (three) times daily before meals.       . sitaGLIPtan-metformin (JANUMET) 50-1000 MG per tablet Take 2 tablets by mouth daily.         Review of Systems Review of Systems  30 pound weight loss, unexplained. No known breast masses.     Blood pressure 120/70, pulse 68, temperature 97.1 F (36.2 C), temperature source Temporal, resp. rate 16, height 5\' 1"  (1.549 m), weight 105 lb 2 oz (47.684 kg).  Physical Exam Physical Exam  Constitutional: She appears well-developed and well-nourished. No distress.  HENT:  Head: Normocephalic and atraumatic.  Right breast-no masses or suspicious changes. Left breast-medial scar with a 1 cm firm fixed mass adjacent to this.Limited ultrasound demonstrates a solid mass. Musculoskeletal:       No axillary adenopathy  Lymphadenopathy:    She has no cervical adenopathy.    Data Reviewed   Assessment    High risk for breast cancer-New solid left breast masses noted. It is fixed to the chest wall and not sure it safe to do a needle biopsy because of this. This was not seen on a mammogram done earlier this year. Plan    Given her family history of breast cancer I think this area should be removed and does we'll plan for  outpatient excision of breast mass. I've explained the procedure and risks to her. Risks include but are not limited to bleeding, infection, wound healing problems.       Wissam Resor J 03/10/2012, 10:22 AM

## 2012-03-10 NOTE — Progress Notes (Signed)
Pt states Dr Einar Gip is her cardiologist.  I faxed a request to his office requesting EKG, Echo Stress test and last office note.

## 2012-03-11 ENCOUNTER — Encounter (HOSPITAL_COMMUNITY): Payer: Self-pay | Admitting: *Deleted

## 2012-03-11 ENCOUNTER — Ambulatory Visit (HOSPITAL_COMMUNITY)
Admission: RE | Admit: 2012-03-11 | Discharge: 2012-03-11 | Disposition: A | Discharge status: Home / Self Care | Payer: Medicare Other | Source: Ambulatory Visit | Attending: General Surgery | Admitting: General Surgery

## 2012-03-11 ENCOUNTER — Ambulatory Visit (HOSPITAL_COMMUNITY): Payer: Medicare Other | Admitting: Anesthesiology

## 2012-03-11 ENCOUNTER — Encounter (HOSPITAL_COMMUNITY): Payer: Self-pay | Admitting: Anesthesiology

## 2012-03-11 ENCOUNTER — Encounter (HOSPITAL_COMMUNITY)
Admission: RE | Disposition: A | Discharge status: Home / Self Care | Payer: Self-pay | Source: Ambulatory Visit | Attending: General Surgery

## 2012-03-11 DIAGNOSIS — Z833 Family history of diabetes mellitus: Secondary | ICD-10-CM | POA: Insufficient documentation

## 2012-03-11 DIAGNOSIS — E119 Type 2 diabetes mellitus without complications: Secondary | ICD-10-CM | POA: Insufficient documentation

## 2012-03-11 DIAGNOSIS — N63 Unspecified lump in unspecified breast: Secondary | ICD-10-CM | POA: Diagnosis not present

## 2012-03-11 DIAGNOSIS — Z90710 Acquired absence of both cervix and uterus: Secondary | ICD-10-CM | POA: Insufficient documentation

## 2012-03-11 DIAGNOSIS — Z7982 Long term (current) use of aspirin: Secondary | ICD-10-CM | POA: Insufficient documentation

## 2012-03-11 DIAGNOSIS — Z8249 Family history of ischemic heart disease and other diseases of the circulatory system: Secondary | ICD-10-CM | POA: Insufficient documentation

## 2012-03-11 DIAGNOSIS — D249 Benign neoplasm of unspecified breast: Secondary | ICD-10-CM | POA: Insufficient documentation

## 2012-03-11 DIAGNOSIS — E079 Disorder of thyroid, unspecified: Secondary | ICD-10-CM | POA: Insufficient documentation

## 2012-03-11 DIAGNOSIS — Z9849 Cataract extraction status, unspecified eye: Secondary | ICD-10-CM | POA: Insufficient documentation

## 2012-03-11 DIAGNOSIS — K219 Gastro-esophageal reflux disease without esophagitis: Secondary | ICD-10-CM | POA: Diagnosis not present

## 2012-03-11 DIAGNOSIS — I10 Essential (primary) hypertension: Secondary | ICD-10-CM | POA: Insufficient documentation

## 2012-03-11 DIAGNOSIS — Z823 Family history of stroke: Secondary | ICD-10-CM | POA: Insufficient documentation

## 2012-03-11 DIAGNOSIS — E7800 Pure hypercholesterolemia, unspecified: Secondary | ICD-10-CM | POA: Insufficient documentation

## 2012-03-11 DIAGNOSIS — N641 Fat necrosis of breast: Secondary | ICD-10-CM | POA: Diagnosis not present

## 2012-03-11 DIAGNOSIS — D509 Iron deficiency anemia, unspecified: Secondary | ICD-10-CM | POA: Insufficient documentation

## 2012-03-11 DIAGNOSIS — M129 Arthropathy, unspecified: Secondary | ICD-10-CM | POA: Insufficient documentation

## 2012-03-11 DIAGNOSIS — Z803 Family history of malignant neoplasm of breast: Secondary | ICD-10-CM | POA: Insufficient documentation

## 2012-03-11 HISTORY — PX: BREAST BIOPSY: SHX20

## 2012-03-11 LAB — GLUCOSE, CAPILLARY
Glucose-Capillary: 90 mg/dL (ref 70–99)
Glucose-Capillary: 104 mg/dL — ABNORMAL HIGH (ref 70–99)

## 2012-03-11 SURGERY — BREAST BIOPSY
Anesthesia: Monitor Anesthesia Care | Site: Breast | Laterality: Left | Wound class: Clean

## 2012-03-11 MED ORDER — LIDOCAINE HCL (PF) 1 % IJ SOLN
INTRAMUSCULAR | Status: AC
  Filled 2012-03-11: qty 30

## 2012-03-11 MED ORDER — OXYCODONE HCL 5 MG PO TABS: 5.0000 mg | ORAL_TABLET | Freq: Once | ORAL | Status: DC | PRN

## 2012-03-11 MED ORDER — OXYCODONE HCL 5 MG/5ML PO SOLN: 5.0000 mg | Freq: Once | ORAL | Status: DC | PRN

## 2012-03-11 MED ORDER — FENTANYL CITRATE 0.05 MG/ML IJ SOLN: 50.0000 ug | Freq: Once | INTRAMUSCULAR | Status: DC

## 2012-03-11 MED ORDER — PROMETHAZINE HCL 25 MG/ML IJ SOLN: 6.2500 mg | INTRAMUSCULAR | Status: DC | PRN

## 2012-03-11 MED ORDER — HYDROCODONE-ACETAMINOPHEN 5-325 MG PO TABS: 1.0000 | ORAL_TABLET | ORAL | Status: DC | PRN

## 2012-03-11 MED ORDER — SCOPOLAMINE 1 MG/3DAYS TD PT72: 1.0000 | MEDICATED_PATCH | Freq: Once | TRANSDERMAL | Status: DC

## 2012-03-11 MED ORDER — FENTANYL CITRATE 0.05 MG/ML IJ SOLN: 25.0000 ug | INTRAMUSCULAR | Status: DC | PRN

## 2012-03-11 MED ORDER — ONDANSETRON HCL 4 MG/2ML IJ SOLN: 4.0000 mg | Freq: Four times a day (QID) | INTRAMUSCULAR | Status: DC | PRN

## 2012-03-11 MED ORDER — LIDOCAINE-EPINEPHRINE (PF) 1 %-1:200000 IJ SOLN
INTRAMUSCULAR | Status: DC | PRN
  Administered 2012-03-11: 2.5 mL

## 2012-03-11 MED ORDER — ACETAMINOPHEN 325 MG PO TABS: 650.0000 mg | ORAL_TABLET | ORAL | Status: DC | PRN

## 2012-03-11 MED ORDER — BUPIVACAINE HCL (PF) 0.25 % IJ SOLN
INTRAMUSCULAR | Status: AC
  Filled 2012-03-11: qty 30

## 2012-03-11 MED ORDER — OXYCODONE HCL 5 MG PO TABS: 5.0000 mg | ORAL_TABLET | ORAL | Status: DC | PRN

## 2012-03-11 MED ORDER — LACTATED RINGERS IV SOLN
INTRAVENOUS | Status: DC | PRN
  Administered 2012-03-11: 13:00:00 via INTRAVENOUS

## 2012-03-11 MED ORDER — ACETAMINOPHEN 650 MG RE SUPP: 650.0000 mg | RECTAL | Status: DC | PRN

## 2012-03-11 MED ORDER — SODIUM CHLORIDE 0.9 % IJ SOLN: 3.0000 mL | INTRAMUSCULAR | Status: DC | PRN

## 2012-03-11 MED ORDER — BUPIVACAINE HCL (PF) 0.25 % IJ SOLN
INTRAMUSCULAR | Status: DC | PRN
  Administered 2012-03-11: 2.5 mL

## 2012-03-11 MED ORDER — FENTANYL CITRATE 0.05 MG/ML IJ SOLN
INTRAMUSCULAR | Status: DC | PRN
  Administered 2012-03-11: 50 ug via INTRAVENOUS

## 2012-03-11 MED ORDER — MIDAZOLAM HCL 5 MG/5ML IJ SOLN
INTRAMUSCULAR | Status: DC | PRN
  Administered 2012-03-11: .5 mg via INTRAVENOUS

## 2012-03-11 MED ORDER — MIDAZOLAM HCL 2 MG/2ML IJ SOLN: 1.0000 mg | INTRAMUSCULAR | Status: DC | PRN

## 2012-03-11 MED ORDER — PROPOFOL 10 MG/ML IV BOLUS
INTRAVENOUS | Status: DC | PRN
  Administered 2012-03-11: 125 mg via INTRAVENOUS

## 2012-03-11 SURGICAL SUPPLY — 48 items
APL SKNCLS STERI-STRIP NONHPOA (GAUZE/BANDAGES/DRESSINGS) ×1
BANDAGE ELASTIC 6 VELCRO ST LF (GAUZE/BANDAGES/DRESSINGS) IMPLANT
BENZOIN TINCTURE PRP APPL 2/3 (GAUZE/BANDAGES/DRESSINGS) ×2 IMPLANT
BINDER BREAST LRG (GAUZE/BANDAGES/DRESSINGS) IMPLANT
BINDER BREAST XLRG (GAUZE/BANDAGES/DRESSINGS) IMPLANT
BLADE SURG 15 STRL LF DISP TIS (BLADE) ×1 IMPLANT
BLADE SURG 15 STRL SS (BLADE) ×2
CHLORAPREP W/TINT 26ML (MISCELLANEOUS) ×2 IMPLANT
CLOTH BEACON ORANGE TIMEOUT ST (SAFETY) ×2 IMPLANT
CONT SPEC 4OZ CLIKSEAL STRL BL (MISCELLANEOUS) ×1 IMPLANT
COVER SURGICAL LIGHT HANDLE (MISCELLANEOUS) ×2 IMPLANT
DECANTER SPIKE VIAL GLASS SM (MISCELLANEOUS) ×2 IMPLANT
DEVICE DUBIN SPECIMEN MAMMOGRA (MISCELLANEOUS) IMPLANT
DRAPE PED LAPAROTOMY (DRAPES) ×2 IMPLANT
DRSG TEGADERM 4X4.75 (GAUZE/BANDAGES/DRESSINGS) ×1 IMPLANT
ELECT CAUTERY BLADE 6.4 (BLADE) ×2 IMPLANT
ELECT REM PT RETURN 9FT ADLT (ELECTROSURGICAL) ×2
ELECTRODE REM PT RTRN 9FT ADLT (ELECTROSURGICAL) ×1 IMPLANT
GAUZE SPONGE 2X2 8PLY STRL LF (GAUZE/BANDAGES/DRESSINGS) ×1 IMPLANT
GLOVE BIO SURGEON STRL SZ8.5 (GLOVE) ×2 IMPLANT
GLOVE BIOGEL PI IND STRL 8 (GLOVE) ×1 IMPLANT
GLOVE BIOGEL PI INDICATOR 8 (GLOVE) ×1
GLOVE ECLIPSE 8.0 STRL XLNG CF (GLOVE) ×2 IMPLANT
GOWN PREVENTION PLUS XXLARGE (GOWN DISPOSABLE) ×2 IMPLANT
GOWN STRL NON-REIN LRG LVL3 (GOWN DISPOSABLE) ×3 IMPLANT
KIT BASIN OR (CUSTOM PROCEDURE TRAY) ×2 IMPLANT
KIT ROOM TURNOVER OR (KITS) ×2 IMPLANT
NDL HYPO 25GX1X1/2 BEV (NEEDLE) ×1 IMPLANT
NEEDLE HYPO 25GX1X1/2 BEV (NEEDLE) ×2 IMPLANT
NS IRRIG 1000ML POUR BTL (IV SOLUTION) ×2 IMPLANT
PACK SURGICAL SETUP 50X90 (CUSTOM PROCEDURE TRAY) ×2 IMPLANT
PAD ARMBOARD 7.5X6 YLW CONV (MISCELLANEOUS) ×4 IMPLANT
PENCIL BUTTON HOLSTER BLD 10FT (ELECTRODE) ×2 IMPLANT
SPONGE GAUZE 2X2 STER 10/PKG (GAUZE/BANDAGES/DRESSINGS) ×1
SPONGE GAUZE 4X4 12PLY (GAUZE/BANDAGES/DRESSINGS) ×1 IMPLANT
SPONGE LAP 4X18 X RAY DECT (DISPOSABLE) ×1 IMPLANT
STRIP CLOSURE SKIN 1/2X4 (GAUZE/BANDAGES/DRESSINGS) ×2 IMPLANT
SUT MON AB 4-0 PC3 18 (SUTURE) ×2 IMPLANT
SUT SILK 3 0 SH 30 (SUTURE) ×2 IMPLANT
SUT VIC AB 3-0 SH 18 (SUTURE) ×2 IMPLANT
SYR BULB 3OZ (MISCELLANEOUS) ×2 IMPLANT
SYR CONTROL 10ML LL (SYRINGE) ×2 IMPLANT
TAPE CLOTH SURG 4X10 WHT LF (GAUZE/BANDAGES/DRESSINGS) ×1 IMPLANT
TOWEL OR 17X24 6PK STRL BLUE (TOWEL DISPOSABLE) ×2 IMPLANT
TOWEL OR 17X26 10 PK STRL BLUE (TOWEL DISPOSABLE) ×2 IMPLANT
TUBE CONNECTING 12X1/4 (SUCTIONS) IMPLANT
WATER STERILE IRR 1000ML POUR (IV SOLUTION) IMPLANT
YANKAUER SUCT BULB TIP NO VENT (SUCTIONS) ×2 IMPLANT

## 2012-03-11 NOTE — Preoperative (Signed)
Beta Blockers   Reason not to administer Beta Blockers:Not Applicable 

## 2012-03-11 NOTE — H&P (View-Only) (Signed)
Chief Complaint  Patient presents with  . Follow-up    Family history of breast cancer    HPI Abigail Hoover is a 66 y.o. female.   HPI  Abigail Hoover is a patient in the high risk category for breast cancer and is here for her 6 month visit.  She has intermittent left breast pain.  Past Medical History  Diagnosis Date  . Diabetes mellitus   . Hypertension   . Thyroid disease   . Arthritis   . Anemia   . GERD (gastroesophageal reflux disease)   . Hypercholesterolemia   . Bladder irritation   . Iron deficiency anemia, unspecified     Past Surgical History  Procedure Date  . Abdominal hysterectomy     TAH  . Cervical disc surgery   . Cataract extraction   . Breast biopsy     Left breast biopsy benign lesion    Family History  Problem Relation Age of Onset  . Breast cancer Sister   . Heart disease Mother     heart attack  . Stroke Father     brain hemorrhage  . Diabetes Brother   . Cancer Brother     Social History History  Substance Use Topics  . Smoking status: Never Smoker   . Smokeless tobacco: Never Used  . Alcohol Use: No    Allergies  Allergen Reactions  . Ezetimibe Itching  . Atorvastatin     REACTION: rash    Current Outpatient Prescriptions  Medication Sig Dispense Refill  . dronabinol (MARINOL) 2.5 MG capsule Twice daily.      Marland Kitchen triamcinolone cream (KENALOG) 0.1 % as needed.      Marland Kitchen amLODipine (NORVASC) 10 MG tablet Take 10 mg by mouth daily.        Marland Kitchen aspirin 81 MG tablet Take 81 mg by mouth daily.        . Cholecalciferol (VITAMIN D3) 1000 UNITS CAPS Take 1,000 Units by mouth daily.      . clonazePAM (KLONOPIN) 1 MG tablet Take 1 mg by mouth as needed. Take daily as needed.       . colesevelam (WELCHOL) 625 MG tablet Take 3,750 mg by mouth daily.       . cyanocobalamin ([employer]S VITAMIN B12) 1000 MCG tablet Take 1 tablet (1,000 mcg total) by mouth daily.      Marland Kitchen dexlansoprazole (DEXILANT) 60 MG capsule Take 60 mg by mouth daily.        . ferrous  sulfate 325 (65 FE) MG tablet Take 325 mg by mouth 3 (three) times daily with meals.       . furosemide (LASIX) 20 MG tablet Take 20 mg by mouth daily as needed.      . gabapentin (NEURONTIN) 300 MG capsule Take 300 mg by mouth 3 (three) times daily.       Marland Kitchen levothyroxine (SYNTHROID) 75 MCG tablet Take 88 mcg by mouth daily.       Marland Kitchen losartan-hydrochlorothiazide (HYZAAR) 100-25 MG per tablet Take 1 tablet by mouth daily.        . metoprolol (TOPROL-XL) 100 MG 24 hr tablet Take 100 mg by mouth daily.       . miglitol (GLYSET) 25 MG tablet Take 50 mg by mouth daily. One tablet by mouth once daily      . MYRBETRIQ 25 MG TB24 Take 1 tablet by mouth Daily.      . pentosan polysulfate (ELMIRON) 100 MG capsule Take 100 mg by  mouth 3 (three) times daily before meals.       . sitaGLIPtan-metformin (JANUMET) 50-1000 MG per tablet Take 2 tablets by mouth daily.         Review of Systems Review of Systems  30 pound weight loss, unexplained. No known breast masses.     Blood pressure 120/70, pulse 68, temperature 97.1 F (36.2 C), temperature source Temporal, resp. rate 16, height 5\' 1"  (1.549 m), weight 105 lb 2 oz (47.684 kg).  Physical Exam Physical Exam  Constitutional: She appears well-developed and well-nourished. No distress.  HENT:  Head: Normocephalic and atraumatic.  Right breast-no masses or suspicious changes. Left breast-medial scar with a 1 cm firm fixed mass adjacent to this.Limited ultrasound demonstrates a solid mass. Musculoskeletal:       No axillary adenopathy  Lymphadenopathy:    She has no cervical adenopathy.    Data Reviewed   Assessment    High risk for breast cancer-New solid left breast masses noted. It is fixed to the chest wall and not sure it safe to do a needle biopsy because of this. This was not seen on a mammogram done earlier this year. Plan    Given her family history of breast cancer I think this area should be removed and does we'll plan for  outpatient excision of breast mass. I've explained the procedure and risks to her. Risks include but are not limited to bleeding, infection, wound healing problems.       Adriyanna Christians J 03/10/2012, 10:22 AM

## 2012-03-11 NOTE — Anesthesia Postprocedure Evaluation (Signed)
Anesthesia Post Note  Patient: Abigail Hoover  Procedure(s) Performed: Procedure(s) (LRB): BREAST BIOPSY (Left)  Anesthesia type: GA  Patient location: PACU  Post pain: Pain level controlled  Post assessment: Post-op Vital signs reviewed  Last Vitals:  Filed Vitals:   03/11/12 1500  BP:   Pulse: 62  Temp: 36.2 C  Resp: 12    Post vital signs: Reviewed  Level of consciousness: sedated  Complications: No apparent anesthesia complications

## 2012-03-11 NOTE — Interval H&P Note (Signed)
History and Physical Interval Note:  03/11/2012 1:17 PM  Abigail Hoover  has presented today for surgery, with the diagnosis of left breast mass  The various methods of treatment have been discussed with the patient and family. After consideration of risks, benefits and other options for treatment, the patient has consented to  Procedure(s) (LRB) with comments: BREAST BIOPSY (Left) - remove left breast mass as a surgical intervention .  The patient's history has been reviewed, patient examined, no change in status, stable for surgery.  I have reviewed the patient's chart and labs.  Questions were answered to the patient's satisfaction.     Malasia Torain Lenna Sciara

## 2012-03-11 NOTE — Anesthesia Procedure Notes (Signed)
Procedure Name: LMA Insertion Date/Time: 03/11/2012 1:26 PM Performed by: Neldon Newport Pre-anesthesia Checklist: Patient identified, Timeout performed, Emergency Drugs available, Suction available and Patient being monitored Patient Re-evaluated:Patient Re-evaluated prior to inductionOxygen Delivery Method: Circle system utilized Preoxygenation: Pre-oxygenation with 100% oxygen Intubation Type: IV induction Ventilation: Mask ventilation without difficulty LMA: LMA inserted LMA Size: 3.0 Number of attempts: 1 Placement Confirmation: positive ETCO2 Tube secured with: Tape Dental Injury: Teeth and Oropharynx as per pre-operative assessment

## 2012-03-11 NOTE — Transfer of Care (Signed)
Immediate Anesthesia Transfer of Care Note  Patient: Abigail Hoover  Procedure(s) Performed: Procedure(s) (LRB) with comments: BREAST BIOPSY (Left) - remove left breast mass  Patient Location: PACU  Anesthesia Type:General  Level of Consciousness: awake, alert  and oriented  Airway & Oxygen Therapy: Patient Spontanous Breathing and Patient connected to nasal cannula oxygen  Post-op Assessment: Report given to PACU RN, Post -op Vital signs reviewed and stable and Patient moving all extremities X 4  Post vital signs: Reviewed and stable  Complications: No apparent anesthesia complications

## 2012-03-11 NOTE — Op Note (Signed)
Operative Note  Abigail Hoover female 66 y.o. 03/11/2012  PREOPERATIVE DX:  Left Breast Mass  POSTOPERATIVE DX:  Same  PROCEDURE:  Excision of left breast mass         Surgeon: Odis Hollingshead   Assistants: none  Anesthesia: General LMA anesthesia  Indications:   This is a 66 year old female with a family history of breast cancer who was filed as a high-risk patient. She presented with a palpable mass in the medial aspect of the left breast was fairly close to her rib cage. She is brought to the operating room for excisional biopsy.    Procedure Detail:  She was seen in the holding area in the left breast marked with my initials. She is brought to the operating room placed supine on the operating table and a general anesthetic was given. The left breast was sterilely prepped and draped.  There was a previous medial scar that was re-incised and the subcutaneous tissue was divided sharply. The mass was identified and excised with electrocautery. It was about 1 cm in size and it was fairly firm. it was sent to pathology.  The wound was inspected and bleeding was controlled with electrocautery. Local anesthetic consisting of a mixture of Xylocaine and Marcaine was injected into the wound for local anesthetic effect.  The subcutaneous tissues are approximated with interrupted 3-0 Vicryl sutures. The skin was closed with 4-0 Monocryl subcuticular stitch. Steri-Strips and sterile dressings were applied.  She tolerated the procedure without any apparent complications he was taken to the recovery room in satisfactory condition.  Estimated Blood Loss:  Minimal         Drains: none  Blood Given: none          Specimens: Left breast mass        Complications:  * No complications entered in OR log *         Disposition: PACU - hemodynamically stable.         Condition: stable

## 2012-03-11 NOTE — Anesthesia Preprocedure Evaluation (Addendum)
Anesthesia Evaluation  Patient identified by MRN, date of birth, ID band Patient awake    Reviewed: Allergy & Precautions, H&P , NPO status , Patient's Chart, lab work & pertinent test results  Airway Mallampati: I TM Distance: >3 FB Neck ROM: Full    Dental  (+) Teeth Intact and Dental Advidsory Given   Pulmonary  breath sounds clear to auscultation        Cardiovascular hypertension, Rhythm:Regular Rate:Normal     Neuro/Psych Anxiety    GI/Hepatic GERD-  ,  Endo/Other  diabetesHypothyroidism   Renal/GU      Musculoskeletal   Abdominal   Peds  Hematology   Anesthesia Other Findings   Reproductive/Obstetrics                          Anesthesia Physical Anesthesia Plan  ASA: III  Anesthesia Plan: General   Post-op Pain Management:    Induction: Intravenous  Airway Management Planned: LMA  Additional Equipment:   Intra-op Plan:   Post-operative Plan: Extubation in OR  Informed Consent: I have reviewed the patients History and Physical, chart, labs and discussed the procedure including the risks, benefits and alternatives for the proposed anesthesia with the patient or authorized representative who has indicated his/her understanding and acceptance.   Dental Advisory Given  Plan Discussed with: CRNA and Surgeon  Anesthesia Plan Comments:       Anesthesia Quick Evaluation

## 2012-03-12 ENCOUNTER — Telehealth (INDEPENDENT_AMBULATORY_CARE_PROVIDER_SITE_OTHER): Payer: Self-pay | Admitting: General Surgery

## 2012-03-12 ENCOUNTER — Encounter (HOSPITAL_COMMUNITY): Payer: Self-pay | Admitting: General Surgery

## 2012-03-12 NOTE — Telephone Encounter (Signed)
Pt called to clarify when she can remove dressing and shower.  Surgery was yesterday, so instructed to wait at least 48 hours.  She is able to repeat and understand these instructions.

## 2012-03-13 DIAGNOSIS — E119 Type 2 diabetes mellitus without complications: Secondary | ICD-10-CM | POA: Diagnosis not present

## 2012-03-13 DIAGNOSIS — I10 Essential (primary) hypertension: Secondary | ICD-10-CM | POA: Diagnosis not present

## 2012-03-13 DIAGNOSIS — E871 Hypo-osmolality and hyponatremia: Secondary | ICD-10-CM | POA: Diagnosis not present

## 2012-03-13 DIAGNOSIS — E039 Hypothyroidism, unspecified: Secondary | ICD-10-CM | POA: Diagnosis not present

## 2012-03-20 DIAGNOSIS — I10 Essential (primary) hypertension: Secondary | ICD-10-CM | POA: Diagnosis not present

## 2012-03-20 DIAGNOSIS — E7800 Pure hypercholesterolemia, unspecified: Secondary | ICD-10-CM | POA: Diagnosis not present

## 2012-03-20 DIAGNOSIS — E119 Type 2 diabetes mellitus without complications: Secondary | ICD-10-CM | POA: Diagnosis not present

## 2012-03-23 ENCOUNTER — Encounter (INDEPENDENT_AMBULATORY_CARE_PROVIDER_SITE_OTHER): Payer: Self-pay | Admitting: General Surgery

## 2012-03-23 ENCOUNTER — Other Ambulatory Visit: Payer: Self-pay | Admitting: *Deleted

## 2012-03-23 ENCOUNTER — Ambulatory Visit (INDEPENDENT_AMBULATORY_CARE_PROVIDER_SITE_OTHER): Payer: Medicare Other | Admitting: General Surgery

## 2012-03-23 VITALS — BP 100/62 | HR 60 | Temp 97.0°F | Resp 16 | Ht 61.0 in | Wt 105.8 lb

## 2012-03-23 DIAGNOSIS — D638 Anemia in other chronic diseases classified elsewhere: Secondary | ICD-10-CM

## 2012-03-23 DIAGNOSIS — Z9889 Other specified postprocedural states: Secondary | ICD-10-CM

## 2012-03-23 NOTE — Patient Instructions (Signed)
Let the tape strips fall off by themselves.

## 2012-03-23 NOTE — Progress Notes (Signed)
Procedure:Excision of left breast mass  Date:03/11/2012  Pathology:Benign fibroadenoma-like tissue and fat necrosis  History:She is here for her first postoperative visit and has no complaints.  Exam: General- Is in NAD. Left breast-medial incision is clean and intact with Steri-Strips present.  Assessment:  Left Breast mass is benign.  Plan:  Return visit in 6 months.

## 2012-03-24 ENCOUNTER — Ambulatory Visit: Payer: Medicare Other

## 2012-03-24 ENCOUNTER — Other Ambulatory Visit (HOSPITAL_BASED_OUTPATIENT_CLINIC_OR_DEPARTMENT_OTHER): Payer: Medicare Other | Admitting: Lab

## 2012-03-24 DIAGNOSIS — D638 Anemia in other chronic diseases classified elsewhere: Secondary | ICD-10-CM | POA: Diagnosis not present

## 2012-03-24 LAB — CBC WITH DIFFERENTIAL/PLATELET
BASO%: 0.3 % (ref 0.0–2.0)
Basophils Absolute: 0 10*3/uL (ref 0.0–0.1)
EOS%: 2.8 % (ref 0.0–7.0)
Eosinophils Absolute: 0.2 10*3/uL (ref 0.0–0.5)
HCT: 41.1 % (ref 34.8–46.6)
HGB: 12.7 g/dL (ref 11.6–15.9)
LYMPH%: 17.2 % (ref 14.0–49.7)
MCH: 27.4 pg (ref 25.1–34.0)
MCHC: 30.9 g/dL — ABNORMAL LOW (ref 31.5–36.0)
MCV: 88.6 fL (ref 79.5–101.0)
MONO#: 0.5 10*3/uL (ref 0.1–0.9)
MONO%: 6.9 % (ref 0.0–14.0)
NEUT#: 4.8 10*3/uL (ref 1.5–6.5)
NEUT%: 72.8 % (ref 38.4–76.8)
Platelets: 248 10*3/uL (ref 145–400)
RBC: 4.64 10*6/uL (ref 3.70–5.45)
RDW: 15.6 % — ABNORMAL HIGH (ref 11.2–14.5)
WBC: 6.5 10*3/uL (ref 3.9–10.3)
lymph#: 1.1 10*3/uL (ref 0.9–3.3)
nRBC: 0 % (ref 0–0)

## 2012-03-24 NOTE — Progress Notes (Signed)
Aranesp held Hgb 12.7

## 2012-03-26 DIAGNOSIS — E785 Hyperlipidemia, unspecified: Secondary | ICD-10-CM | POA: Diagnosis not present

## 2012-03-26 DIAGNOSIS — E871 Hypo-osmolality and hyponatremia: Secondary | ICD-10-CM | POA: Diagnosis not present

## 2012-03-26 DIAGNOSIS — E119 Type 2 diabetes mellitus without complications: Secondary | ICD-10-CM | POA: Diagnosis not present

## 2012-03-26 DIAGNOSIS — R609 Edema, unspecified: Secondary | ICD-10-CM | POA: Diagnosis not present

## 2012-03-26 DIAGNOSIS — R634 Abnormal weight loss: Secondary | ICD-10-CM | POA: Diagnosis not present

## 2012-03-26 DIAGNOSIS — I10 Essential (primary) hypertension: Secondary | ICD-10-CM | POA: Diagnosis not present

## 2012-04-14 ENCOUNTER — Ambulatory Visit: Payer: Medicare Other

## 2012-04-14 ENCOUNTER — Other Ambulatory Visit: Payer: Medicare Other | Admitting: Lab

## 2012-04-25 ENCOUNTER — Telehealth: Payer: Self-pay | Admitting: *Deleted

## 2012-04-25 NOTE — Telephone Encounter (Signed)
Per patient reassignment I have attempted to contact the patient. I have tried to call the patient via the mobile number and unable to leave message. I then called the home number and left message to call me back. I have left the number [phone] to call today until 12pm and the number [phone] to call on Monday. Appts for Dr. Jamse Arn canceled.    JMW

## 2012-04-27 ENCOUNTER — Telehealth: Payer: Self-pay | Admitting: *Deleted

## 2012-04-27 ENCOUNTER — Encounter: Payer: Self-pay | Admitting: *Deleted

## 2012-04-27 NOTE — Telephone Encounter (Signed)
Per patient reassignment I have contacted that patient. I have explained that Dr. Jamse Arn is no longer with the practice. I have given her the new appts. Letter mailed.   JMW

## 2012-04-30 ENCOUNTER — Encounter: Payer: Self-pay | Admitting: *Deleted

## 2012-05-05 ENCOUNTER — Ambulatory Visit: Payer: Medicare Other

## 2012-05-05 ENCOUNTER — Other Ambulatory Visit: Payer: Medicare Other | Admitting: Lab

## 2012-05-26 ENCOUNTER — Ambulatory Visit: Payer: Medicare Other

## 2012-05-26 ENCOUNTER — Other Ambulatory Visit: Payer: Medicare Other | Admitting: Lab

## 2012-05-28 ENCOUNTER — Telehealth: Payer: Self-pay | Admitting: *Deleted

## 2012-05-28 NOTE — Telephone Encounter (Signed)
Patient called requesting appointment to receive aranesp injection.  Informed her of next appointmnet on June 16, 2012.  Reports having left for [place] March 30, 2012 and has been there for the past two months.  Dr. Jamse Arn knows she was away and this appointment is too far out.  Explained the restructuring of [facility] and she has been re-assigned to a Librarian, academic.  Asked to speak with new provider.  This nurse will notify provider of this request.  Noted Dr. Hollice Espy last office note of 03-03-2012.  Informed her we will be in touch.  Asked for a call tomorrow or Monday.

## 2012-06-01 NOTE — Progress Notes (Signed)
Patient to be placed with new physician.  Message sent to [facility].

## 2012-06-02 ENCOUNTER — Telehealth: Payer: Self-pay | Admitting: Pulmonary Disease

## 2012-06-02 NOTE — Telephone Encounter (Signed)
Attempted to call pt x 3 to make next ov per recall.  Pt never returned calls.  Mailed recall letter 06/03/12.

## 2012-06-05 DIAGNOSIS — R51 Headache: Secondary | ICD-10-CM | POA: Diagnosis not present

## 2012-06-16 ENCOUNTER — Other Ambulatory Visit (HOSPITAL_BASED_OUTPATIENT_CLINIC_OR_DEPARTMENT_OTHER): Payer: Medicare Other | Admitting: Lab

## 2012-06-16 ENCOUNTER — Ambulatory Visit: Payer: Medicare Other

## 2012-06-16 ENCOUNTER — Ambulatory Visit (HOSPITAL_BASED_OUTPATIENT_CLINIC_OR_DEPARTMENT_OTHER): Payer: Medicare Other

## 2012-06-16 VITALS — BP 133/68 | HR 63 | Temp 97.3°F

## 2012-06-16 DIAGNOSIS — D638 Anemia in other chronic diseases classified elsewhere: Secondary | ICD-10-CM

## 2012-06-16 LAB — CBC WITH DIFFERENTIAL/PLATELET
BASO%: 0.3 % (ref 0.0–2.0)
Basophils Absolute: 0 10*3/uL (ref 0.0–0.1)
EOS%: 3.4 % (ref 0.0–7.0)
Eosinophils Absolute: 0.2 10*3/uL (ref 0.0–0.5)
HCT: 31.6 % — ABNORMAL LOW (ref 34.8–46.6)
HGB: 10.2 g/dL — ABNORMAL LOW (ref 11.6–15.9)
LYMPH%: 21.9 % (ref 14.0–49.7)
MCH: 26.2 pg (ref 25.1–34.0)
MCHC: 32.1 g/dL (ref 31.5–36.0)
MCV: 81.5 fL (ref 79.5–101.0)
MONO#: 0.4 10*3/uL (ref 0.1–0.9)
MONO%: 5.2 % (ref 0.0–14.0)
NEUT#: 4.7 10*3/uL (ref 1.5–6.5)
NEUT%: 69.2 % (ref 38.4–76.8)
Platelets: 205 10*3/uL (ref 145–400)
RBC: 3.88 10*6/uL (ref 3.70–5.45)
RDW: 15.1 % — ABNORMAL HIGH (ref 11.2–14.5)
WBC: 6.8 10*3/uL (ref 3.9–10.3)
lymph#: 1.5 10*3/uL (ref 0.9–3.3)

## 2012-06-16 MED ORDER — DARBEPOETIN ALFA-POLYSORBATE 300 MCG/0.6ML IJ SOLN
300.0000 ug | Freq: Once | INTRAMUSCULAR | Status: AC
  Administered 2012-06-16: 300 ug via SUBCUTANEOUS
  Filled 2012-06-16: qty 0.6

## 2012-06-16 NOTE — Patient Instructions (Signed)
Darbepoetin Alfa injection What is this medicine? DARBEPOETIN ALFA (dar be POE e tin AL fa) helps your body make more red blood cells. It is used to treat anemia caused by chronic kidney failure and chemotherapy. This medicine may be used for other purposes; ask your health care provider or pharmacist if you have questions. What should I tell my health care provider before I take this medicine? They need to know if you have any of these conditions: -blood clotting disorders or history of blood clots -cancer patient not on chemotherapy -cystic fibrosis -heart disease, such as angina, heart failure, or a history of a heart attack -hemoglobin level of 12 g/dL or greater -high blood pressure -low levels of folate, iron, or vitamin B12 -seizures -an unusual or allergic reaction to darbepoetin, erythropoietin, albumin, hamster proteins, latex, other medicines, foods, dyes, or preservatives -pregnant or trying to get pregnant -breast-feeding How should I use this medicine? This medicine is for injection into a vein or under the skin. It is usually given by a health care professional in a hospital or clinic setting. If you get this medicine at home, you will be taught how to prepare and give this medicine. Do not shake the solution before you withdraw a dose. Use exactly as directed. Take your medicine at regular intervals. Do not take your medicine more often than directed. It is important that you put your used needles and syringes in a special sharps container. Do not put them in a trash can. If you do not have a sharps container, call your pharmacist or healthcare provider to get one. Talk to your pediatrician regarding the use of this medicine in children. While this medicine may be used in children as young as 1 year for selected conditions, precautions do apply. Overdosage: If you think you have taken too much of this medicine contact a poison control center or emergency room at once. NOTE:  This medicine is only for you. Do not share this medicine with others. What if I miss a dose? If you miss a dose, take it as soon as you can. If it is almost time for your next dose, take only that dose. Do not take double or extra doses. What may interact with this medicine? Do not take this medicine with any of the following medications: -epoetin alfa This list may not describe all possible interactions. Give your health care provider a list of all the medicines, herbs, non-prescription drugs, or dietary supplements you use. Also tell them if you smoke, drink alcohol, or use illegal drugs. Some items may interact with your medicine. What should I watch for while using this medicine? Visit your prescriber or health care professional for regular checks on your progress and for the needed blood tests and blood pressure measurements. It is especially important for the doctor to make sure your hemoglobin level is in the desired range, to limit the risk of potential side effects and to give you the best benefit. Keep all appointments for any recommended tests. Check your blood pressure as directed. Ask your doctor what your blood pressure should be and when you should contact him or her. As your body makes more red blood cells, you may need to take iron, folic acid, or vitamin B supplements. Ask your doctor or health care provider which products are right for you. If you have kidney disease continue dietary restrictions, even though this medication can make you feel better. Talk with your doctor or health care professional about the   foods you eat and the vitamins that you take. What side effects may I notice from receiving this medicine? Side effects that you should report to your doctor or health care professional as soon as possible: -allergic reactions like skin rash, itching or hives, swelling of the face, lips, or tongue -breathing problems -changes in vision -chest pain -confusion, trouble speaking  or understanding -feeling faint or lightheaded, falls -high blood pressure -muscle aches or pains -pain, swelling, warmth in the leg -rapid weight gain -severe headaches -sudden numbness or weakness of the face, arm or leg -trouble walking, dizziness, loss of balance or coordination -seizures (convulsions) -swelling of the ankles, feet, hands -unusually weak or tired Side effects that usually do not require medical attention (report to your doctor or health care professional if they continue or are bothersome): -diarrhea -fever, chills (flu-like symptoms) -headaches -nausea, vomiting -redness, stinging, or swelling at site where injected This list may not describe all possible side effects. Call your doctor for medical advice about side effects. You may report side effects to FDA at 1-800-FDA-1088. Where should I keep my medicine? Keep out of the reach of children. Store in a refrigerator between 2 and 8 degrees C (36 and 46 degrees F). Do not freeze. Do not shake. Throw away any unused portion if using a single-dose vial. Throw away any unused medicine after the expiration date. NOTE: This sheet is a summary. It may not cover all possible information. If you have questions about this medicine, talk to your doctor, pharmacist, or health care provider.  2013, [employer]. (02/23/2008 10:23:57 AM)

## 2012-06-17 DIAGNOSIS — I10 Essential (primary) hypertension: Secondary | ICD-10-CM | POA: Diagnosis not present

## 2012-06-17 DIAGNOSIS — E119 Type 2 diabetes mellitus without complications: Secondary | ICD-10-CM | POA: Diagnosis not present

## 2012-06-17 DIAGNOSIS — E7800 Pure hypercholesterolemia, unspecified: Secondary | ICD-10-CM | POA: Diagnosis not present

## 2012-06-17 DIAGNOSIS — M5137 Other intervertebral disc degeneration, lumbosacral region: Secondary | ICD-10-CM | POA: Diagnosis not present

## 2012-06-19 DIAGNOSIS — E119 Type 2 diabetes mellitus without complications: Secondary | ICD-10-CM | POA: Diagnosis not present

## 2012-06-22 ENCOUNTER — Telehealth: Payer: Self-pay | Admitting: Oncology

## 2012-06-23 ENCOUNTER — Other Ambulatory Visit: Payer: Medicare Other | Admitting: Lab

## 2012-06-23 ENCOUNTER — Ambulatory Visit: Payer: Medicare Other | Admitting: Hematology and Oncology

## 2012-06-24 ENCOUNTER — Ambulatory Visit: Payer: Medicare Other | Admitting: Oncology

## 2012-06-24 DIAGNOSIS — E039 Hypothyroidism, unspecified: Secondary | ICD-10-CM | POA: Diagnosis not present

## 2012-06-24 DIAGNOSIS — IMO0001 Reserved for inherently not codable concepts without codable children: Secondary | ICD-10-CM | POA: Diagnosis not present

## 2012-06-24 DIAGNOSIS — I10 Essential (primary) hypertension: Secondary | ICD-10-CM | POA: Diagnosis not present

## 2012-07-05 NOTE — Patient Instructions (Addendum)
1.  Diagnosis:  Chronic anemia most likely due to chronic disease.  2.  Work up in the past were negative including bone marrow biopsy. 3.  Treatment recommendations:  * First option:  Observation with monthly blood test, and transfuse for hemoglobin <8.  *  Second option:  Resuming Aranesp SQ injection monthly for Hgb <11.  There is a slightly increased risk of stroke and heart attack with Aranesp.

## 2012-07-06 ENCOUNTER — Encounter: Payer: Self-pay | Admitting: Oncology

## 2012-07-06 ENCOUNTER — Ambulatory Visit (HOSPITAL_BASED_OUTPATIENT_CLINIC_OR_DEPARTMENT_OTHER): Payer: Medicare Other | Admitting: Oncology

## 2012-07-06 ENCOUNTER — Telehealth: Payer: Self-pay | Admitting: Oncology

## 2012-07-06 ENCOUNTER — Ambulatory Visit: Payer: Medicare Other

## 2012-07-06 VITALS — BP 121/54 | HR 60 | Temp 97.0°F | Resp 20 | Ht 61.0 in | Wt 112.1 lb

## 2012-07-06 DIAGNOSIS — D638 Anemia in other chronic diseases classified elsewhere: Secondary | ICD-10-CM

## 2012-07-07 NOTE — Progress Notes (Signed)
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   CURRENT THERAPY:  Aranesp for Hgb <11.   INTERVAL HISTORY: Abigail Hoover 67 y.o. female returns for regular follow up with her husband.  She used to follow Dr. Braxton Feathers who has since left the practice.  I assumed her care for the first time today.  She reports feeling well on Aranesp.  She has improved stamina, performance status, qualify of life.  She was able to travel to [place] recently.  She denied fever, anorexia, weight loss, visible bleeding, chest pain, SOB, abd pain, diarrhea/constipation, nausea/vomiting.  The rest of the 14-point review of system was negative.   Past Medical History  Diagnosis Date  . Diabetes mellitus   . Hypertension   . Thyroid disease   . Arthritis   . Anemia   . GERD (gastroesophageal reflux disease)   . Hypercholesterolemia   . Bladder irritation   . Iron deficiency anemia, unspecified   . Hypothyroidism   . Anxiety   . Psoriasis     Past Surgical History  Procedure Laterality Date  . Abdominal hysterectomy      TAH  . Cervical disc surgery    . Cataract extraction    . Breast biopsy      Left breast biopsy benign lesion  . Colonoscopy  2013    neg. next 2023.   Marland Kitchen Eye surgery    . Breast biopsy  03/11/2012    Procedure: BREAST BIOPSY;  Surgeon: Odis Hollingshead, MD;  Location: [facility];  Service: General;  Laterality: Left;  remove left breast mass    Current Outpatient Prescriptions  Medication Sig Dispense Refill  . amLODipine (NORVASC) 10 MG tablet Take 10 mg by mouth daily.        Marland Kitchen aspirin 81 MG tablet Take 81 mg by mouth daily.        . Cholecalciferol (VITAMIN D3) 1000 UNITS CAPS Take 1,000 Units by mouth daily.      . colesevelam (WELCHOL) 625 MG tablet Take 3,750 mg by mouth daily.       Marland Kitchen dexlansoprazole (DEXILANT) 60 MG capsule Take 60 mg by mouth  daily.        . ferrous sulfate 325 (65 FE) MG tablet Take 325 mg by mouth 3 (three) times daily with meals.       . furosemide (LASIX) 20 MG tablet Take 20 mg by mouth daily as needed.      . gabapentin (NEURONTIN) 300 MG capsule Take 300 mg by mouth 3 (three) times daily.       Marland Kitchen levothyroxine (SYNTHROID) 75 MCG tablet Take 88 mcg by mouth daily.       Marland Kitchen losartan (COZAAR) 100 MG tablet       . metoprolol (TOPROL-XL) 100 MG 24 hr tablet Take 100 mg by mouth daily.       . miglitol (GLYSET) 25 MG tablet Take 50 mg by mouth daily. One tablet by mouth once daily      . MYRBETRIQ 25 MG TB24 Take 1 tablet by mouth Daily.      . pentosan polysulfate (ELMIRON) 100 MG capsule Take 100 mg by mouth 3 (three) times daily before meals.       . sitaGLIPtan-metformin (JANUMET) 50-1000 MG per tablet Take 2 tablets by mouth daily.  No current facility-administered medications for this visit.    ALLERGIES:  is allergic to ezetimibe and atorvastatin.  REVIEW OF SYSTEMS:  The rest of the 14-point review of system was negative.   Filed Vitals:   07/06/12 1412  BP: 121/54  Pulse: 60  Temp: 97 F (36.1 C)  Resp: 20   Wt Readings from Last 3 Encounters:  07/06/12 112 lb 1.6 oz (50.848 kg)  03/23/12 105 lb 12.8 oz (47.991 kg)  03/10/12 103 lb 13.4 oz (47.1 kg)   ECOG Performance status: 0  PHYSICAL EXAMINATION:   General: thin-appearing woman, in no acute distress.  Eyes:  no scleral icterus.  ENT:  There were no oropharyngeal lesions.  Neck was without thyromegaly.  Lymphatics:  Negative cervical, supraclavicular or axillary adenopathy.  Respiratory: lungs were clear bilaterally without wheezing or crackles.  Cardiovascular:  Regular rate and rhythm, S1/S2, without murmur, rub or gallop.  There was no pedal edema.  GI:  abdomen was soft, flat, nontender, nondistended, without organomegaly.  Muscoloskeletal:  no spinal tenderness of palpation of vertebral spine.  Skin exam was without echymosis,  petichae.  Neuro exam was nonfocal.  Patient was able to get on and off exam table without assistance.  Gait was normal.  Patient was alert and oriented.  Attention was good.   Language was appropriate.  Mood was normal without depression.  Speech was not pressured.  Thought content was not tangential.      LABORATORY/RADIOLOGY DATA:  Lab Results  Component Value Date   WBC 6.8 06/16/2012   HGB 10.2* 06/16/2012   HCT 31.6* 06/16/2012   PLT 205 06/16/2012   GLUCOSE 156* 03/10/2012   ALKPHOS 87 03/10/2012   ALT 32 03/10/2012   AST 35 03/10/2012   NA 135 03/10/2012   K 4.9 03/10/2012   CL 97 03/10/2012   CREATININE 0.83 03/10/2012   BUN 17 03/10/2012   CO2 25 03/10/2012   INR 1.04 03/10/2012   HGBA1C 5.3 08/01/2009    No results found.     ASSESSMENT AND PLAN:   1.  DM:  On Miglitol, Sitagliptan-metformin per PCP.  2.  Hyperlipidemia:  On colesevelam per PCP. 3.  Hypertension:  On amlodipine, losartan, metoprolol per PCP.  4.  Hypothyroidism:  On Levothyroxine.  5.  Chronic anemia most likely due to chronic disease.  - Work up in the past were negative including bone marrow biopsy. -  Treatment recommendations:  * First option:  Observation with monthly blood test, and transfuse for hemoglobin <8.  *  Second option:  Resuming Aranesp SQ injection monthly for Hgb <11.  There is a slightly increased risk of stroke and heart attack with Aranesp.    Abigail Hoover and her husband expressed informed understanding of the risk and benefit of Aranesp.  She said that before starting Aranesp, she had chronic fatigue.  With Aranesp and improved Hgb, she has been able to function better, improved stamina, able to travel to [place], improved quality of life.  She would like to continue Aranesp but every 6 weeks instead of every 3 weeks for Hgb <11.  She last received Aranesp about 3 weeks ago and did not want recheck CBC.  As such, she will return to clinic in about 3 weeks to start Aranesp.   I  also informed her that I'm leaving the practice.  Her care will be taken over by a new physician when she returns in 4 months.    The length of time  of the face-to-face encounter was 15 minutes. More than 50% of time was spent counseling and coordination of care.

## 2012-07-27 ENCOUNTER — Other Ambulatory Visit (HOSPITAL_BASED_OUTPATIENT_CLINIC_OR_DEPARTMENT_OTHER): Payer: Medicare Other | Admitting: Lab

## 2012-07-27 ENCOUNTER — Ambulatory Visit (HOSPITAL_BASED_OUTPATIENT_CLINIC_OR_DEPARTMENT_OTHER): Payer: Medicare Other

## 2012-07-27 VITALS — BP 121/55 | HR 64 | Temp 97.6°F

## 2012-07-27 DIAGNOSIS — D638 Anemia in other chronic diseases classified elsewhere: Secondary | ICD-10-CM | POA: Diagnosis not present

## 2012-07-27 DIAGNOSIS — D509 Iron deficiency anemia, unspecified: Secondary | ICD-10-CM | POA: Diagnosis not present

## 2012-07-27 LAB — CBC WITH DIFFERENTIAL/PLATELET
BASO%: 0.3 % (ref 0.0–2.0)
Basophils Absolute: 0 10e3/uL (ref 0.0–0.1)
EOS%: 4.4 % (ref 0.0–7.0)
Eosinophils Absolute: 0.3 10e3/uL (ref 0.0–0.5)
HCT: 31.5 % — ABNORMAL LOW (ref 34.8–46.6)
HGB: 10 g/dL — ABNORMAL LOW (ref 11.6–15.9)
LYMPH%: 22.2 % (ref 14.0–49.7)
MCH: 27.6 pg (ref 25.1–34.0)
MCHC: 31.8 g/dL (ref 31.5–36.0)
MCV: 86.6 fL (ref 79.5–101.0)
MONO#: 0.3 10e3/uL (ref 0.1–0.9)
MONO%: 5 % (ref 0.0–14.0)
NEUT#: 4.1 10e3/uL (ref 1.5–6.5)
NEUT%: 68.1 % (ref 38.4–76.8)
Platelets: 234 10e3/uL (ref 145–400)
RBC: 3.64 10e6/uL — ABNORMAL LOW (ref 3.70–5.45)
RDW: 15.9 % — ABNORMAL HIGH (ref 11.2–14.5)
WBC: 6.1 10e3/uL (ref 3.9–10.3)
lymph#: 1.3 10e3/uL (ref 0.9–3.3)

## 2012-07-27 LAB — CHCC SMEAR

## 2012-07-27 LAB — COMPREHENSIVE METABOLIC PANEL (CC13)
ALT: 18 U/L (ref 0–55)
AST: 21 U/L (ref 5–34)
Albumin: 3.4 g/dL — ABNORMAL LOW (ref 3.5–5.0)
Alkaline Phosphatase: 92 U/L (ref 40–150)
BUN: 21.3 mg/dL (ref 7.0–26.0)
CO2: 24 mEq/L (ref 22–29)
Calcium: 9.6 mg/dL (ref 8.4–10.4)
Chloride: 103 mEq/L (ref 98–107)
Creatinine: 0.9 mg/dL (ref 0.6–1.1)
Glucose: 118 mg/dl — ABNORMAL HIGH (ref 70–99)
Potassium: 4.4 mEq/L (ref 3.5–5.1)
Sodium: 137 mEq/L (ref 136–145)
Total Bilirubin: 0.55 mg/dL (ref 0.20–1.20)
Total Protein: 6.3 g/dL — ABNORMAL LOW (ref 6.4–8.3)

## 2012-07-27 MED ORDER — DARBEPOETIN ALFA-POLYSORBATE 300 MCG/0.6ML IJ SOLN
300.0000 ug | Freq: Once | INTRAMUSCULAR | Status: AC
  Administered 2012-07-27: 300 ug via SUBCUTANEOUS
  Filled 2012-07-27: qty 0.6

## 2012-07-29 LAB — PROTEIN ELECTROPHORESIS, SERUM
Albumin ELP: 63.5 % (ref 55.8–66.1)
Alpha-1-Globulin: 3.9 % (ref 2.9–4.9)
Alpha-2-Globulin: 11.7 % (ref 7.1–11.8)
Beta 2: 4.8 % (ref 3.2–6.5)
Beta Globulin: 6.8 % (ref 4.7–7.2)
Gamma Globulin: 9.3 % — ABNORMAL LOW (ref 11.1–18.8)
Total Protein, Serum Electrophoresis: 6 g/dL (ref 6.0–8.3)

## 2012-07-29 LAB — KAPPA/LAMBDA LIGHT CHAINS
Kappa free light chain: 2.25 mg/dL — ABNORMAL HIGH (ref 0.33–1.94)
Kappa:Lambda Ratio: 1.15 (ref 0.26–1.65)
Lambda Free Lght Chn: 1.95 mg/dL (ref 0.57–2.63)

## 2012-07-29 LAB — IRON AND TIBC
%SAT: 44 % (ref 20–55)
Iron: 129 ug/dL (ref 42–145)
TIBC: 292 ug/dL (ref 250–470)
UIBC: 163 ug/dL (ref 125–400)

## 2012-07-29 LAB — FERRITIN: Ferritin: 179 ng/mL (ref 10–291)

## 2012-07-30 DIAGNOSIS — IMO0001 Reserved for inherently not codable concepts without codable children: Secondary | ICD-10-CM | POA: Diagnosis not present

## 2012-07-30 DIAGNOSIS — E039 Hypothyroidism, unspecified: Secondary | ICD-10-CM | POA: Diagnosis not present

## 2012-08-03 DIAGNOSIS — E039 Hypothyroidism, unspecified: Secondary | ICD-10-CM | POA: Diagnosis not present

## 2012-08-03 DIAGNOSIS — I10 Essential (primary) hypertension: Secondary | ICD-10-CM | POA: Diagnosis not present

## 2012-08-03 DIAGNOSIS — D649 Anemia, unspecified: Secondary | ICD-10-CM | POA: Diagnosis not present

## 2012-08-03 DIAGNOSIS — IMO0001 Reserved for inherently not codable concepts without codable children: Secondary | ICD-10-CM | POA: Diagnosis not present

## 2012-08-03 DIAGNOSIS — R609 Edema, unspecified: Secondary | ICD-10-CM | POA: Diagnosis not present

## 2012-08-31 ENCOUNTER — Other Ambulatory Visit: Payer: Medicare Other | Admitting: Lab

## 2012-08-31 ENCOUNTER — Ambulatory Visit: Payer: Medicare Other

## 2012-08-31 ENCOUNTER — Other Ambulatory Visit (HOSPITAL_BASED_OUTPATIENT_CLINIC_OR_DEPARTMENT_OTHER): Payer: Medicare Other | Admitting: Lab

## 2012-08-31 DIAGNOSIS — D638 Anemia in other chronic diseases classified elsewhere: Secondary | ICD-10-CM | POA: Diagnosis not present

## 2012-08-31 LAB — CBC WITH DIFFERENTIAL/PLATELET
BASO%: 0.7 % (ref 0.0–2.0)
Basophils Absolute: 0 10*3/uL (ref 0.0–0.1)
EOS%: 10.5 % — ABNORMAL HIGH (ref 0.0–7.0)
Eosinophils Absolute: 0.5 10*3/uL (ref 0.0–0.5)
HCT: 38.3 % (ref 34.8–46.6)
HGB: 12.2 g/dL (ref 11.6–15.9)
LYMPH%: 26.5 % (ref 14.0–49.7)
MCH: 27.7 pg (ref 25.1–34.0)
MCHC: 31.9 g/dL (ref 31.5–36.0)
MCV: 86.9 fL (ref 79.5–101.0)
MONO#: 0.3 10*3/uL (ref 0.1–0.9)
MONO%: 5.8 % (ref 0.0–14.0)
NEUT#: 2.4 10*3/uL (ref 1.5–6.5)
NEUT%: 56.5 % (ref 38.4–76.8)
Platelets: 183 10*3/uL (ref 145–400)
RBC: 4.41 10*6/uL (ref 3.70–5.45)
RDW: 14.9 % — ABNORMAL HIGH (ref 11.2–14.5)
WBC: 4.3 10*3/uL (ref 3.9–10.3)
lymph#: 1.1 10*3/uL (ref 0.9–3.3)

## 2012-09-01 DIAGNOSIS — R6884 Jaw pain: Secondary | ICD-10-CM | POA: Diagnosis not present

## 2012-09-02 ENCOUNTER — Encounter (INDEPENDENT_AMBULATORY_CARE_PROVIDER_SITE_OTHER): Payer: Self-pay | Admitting: General Surgery

## 2012-09-02 ENCOUNTER — Ambulatory Visit (INDEPENDENT_AMBULATORY_CARE_PROVIDER_SITE_OTHER): Payer: Medicare Other | Admitting: General Surgery

## 2012-09-02 VITALS — BP 120/52 | HR 72 | Temp 97.3°F | Resp 18 | Ht 61.0 in | Wt 111.0 lb

## 2012-09-02 DIAGNOSIS — Z1239 Encounter for other screening for malignant neoplasm of breast: Secondary | ICD-10-CM | POA: Diagnosis not present

## 2012-09-02 DIAGNOSIS — Z803 Family history of malignant neoplasm of breast: Secondary | ICD-10-CM

## 2012-09-02 NOTE — Patient Instructions (Signed)
Call for any changes in your breasts.

## 2012-09-02 NOTE — Progress Notes (Signed)
Chief Complaint  Patient presents with  . Breast Cancer Long Term Follow Up    HPI Abigail Hoover is a 67 y.o. female.   HPI  Abigail Hoover is a patient in the high risk category for breast cancer and is here for her 6 month visit.  She still has intermittent left breast pains.  Past Medical History  Diagnosis Date  . Diabetes mellitus   . Hypertension   . Thyroid disease   . Arthritis   . Anemia   . GERD (gastroesophageal reflux disease)   . Hypercholesterolemia   . Bladder irritation   . Iron deficiency anemia, unspecified   . Hypothyroidism   . Anxiety   . Psoriasis     Past Surgical History  Procedure Laterality Date  . Abdominal hysterectomy      TAH  . Cervical disc surgery    . Cataract extraction    . Breast biopsy      Left breast biopsy benign lesion  . Colonoscopy  2013    neg. next 2023.   Marland Kitchen Eye surgery    . Breast biopsy  03/11/2012    Procedure: BREAST BIOPSY;  Surgeon: Odis Hollingshead, MD;  Location: [facility];  Service: General;  Laterality: Left;  remove left breast mass    Family History  Problem Relation Age of Onset  . Breast cancer Sister   . Cancer Sister     breast cancer  . Heart disease Mother     heart attack  . Stroke Father     brain hemorrhage  . Diabetes Brother   . Cancer Brother     Social History History  Substance Use Topics  . Smoking status: Never Smoker   . Smokeless tobacco: Never Used  . Alcohol Use: No    Allergies  Allergen Reactions  . Ezetimibe Itching  . Atorvastatin     REACTION: rash    Current Outpatient Prescriptions  Medication Sig Dispense Refill  . amLODipine (NORVASC) 10 MG tablet Take 10 mg by mouth daily.        Marland Kitchen aspirin 81 MG tablet Take 81 mg by mouth daily.        . Cholecalciferol (VITAMIN D3) 1000 UNITS CAPS Take 1,000 Units by mouth daily.      . colesevelam (WELCHOL) 625 MG tablet Take 3,750 mg by mouth daily.       Marland Kitchen dexlansoprazole (DEXILANT) 60 MG capsule Take 60 mg by mouth daily.         . ferrous sulfate 325 (65 FE) MG tablet Take 325 mg by mouth 3 (three) times daily with meals.       . furosemide (LASIX) 20 MG tablet Take 20 mg by mouth daily as needed.      . gabapentin (NEURONTIN) 300 MG capsule Take 300 mg by mouth 3 (three) times daily.       Marland Kitchen levothyroxine (SYNTHROID) 75 MCG tablet Take 88 mcg by mouth daily.       Marland Kitchen losartan (COZAAR) 100 MG tablet       . metoprolol (TOPROL-XL) 100 MG 24 hr tablet Take 100 mg by mouth daily.       . miglitol (GLYSET) 25 MG tablet Take 50 mg by mouth daily. One tablet by mouth once daily      . MYRBETRIQ 25 MG TB24 Take 1 tablet by mouth Daily.      . pentosan polysulfate (ELMIRON) 100 MG capsule Take 100 mg by mouth 3 (three)  times daily before meals.       . sitaGLIPtan-metformin (JANUMET) 50-1000 MG per tablet Take 2 tablets by mouth daily.        No current facility-administered medications for this visit.    Review of Systems Review of Systems  30 pound weight loss, unexplained. No known breast masses.     Blood pressure 120/52, pulse 72, temperature 97.3 F (36.3 C), temperature source Temporal, resp. rate 18, height 5\' 1"  (1.549 m), weight 111 lb (50.349 kg).  Physical Exam Physical Exam  Constitutional: She appears well-developed and well-nourished. No distress.  HENT:  Head: Normocephalic and atraumatic.  Right breast-no masses or suspicious changes. Left breast-medial scar with indentation, no masses. Musculoskeletal:       No axillary adenopathy  Lymph nodes:    She has no cervical adenopathy.    Data Reviewed   Assessment    High risk for breast cancer-examine is unremarkable.  Mammogram due early next month. Plan    Schedule mammogram.  Return visit in 6 months.      Zerah Hilyer J 09/02/2012, 12:36 PM

## 2012-09-08 DIAGNOSIS — J069 Acute upper respiratory infection, unspecified: Secondary | ICD-10-CM | POA: Diagnosis not present

## 2012-09-10 ENCOUNTER — Telehealth: Payer: Self-pay | Admitting: *Deleted

## 2012-09-10 NOTE — Telephone Encounter (Signed)
sw the pt informed him that we are closed on 11/23/12. gv appt d/t for 11/24/12@12 :45pm for labs, and 1pm inj. i also made the pt aware that i will mail a letter/cal as well...td

## 2012-09-11 DIAGNOSIS — R5383 Other fatigue: Secondary | ICD-10-CM | POA: Diagnosis not present

## 2012-09-11 DIAGNOSIS — R059 Cough, unspecified: Secondary | ICD-10-CM | POA: Diagnosis not present

## 2012-09-11 DIAGNOSIS — J069 Acute upper respiratory infection, unspecified: Secondary | ICD-10-CM | POA: Diagnosis not present

## 2012-09-11 DIAGNOSIS — R05 Cough: Secondary | ICD-10-CM | POA: Diagnosis not present

## 2012-09-11 DIAGNOSIS — R5381 Other malaise: Secondary | ICD-10-CM | POA: Diagnosis not present

## 2012-09-28 DIAGNOSIS — H1130 Conjunctival hemorrhage, unspecified eye: Secondary | ICD-10-CM | POA: Diagnosis not present

## 2012-09-28 DIAGNOSIS — E119 Type 2 diabetes mellitus without complications: Secondary | ICD-10-CM | POA: Diagnosis not present

## 2012-09-28 DIAGNOSIS — Z961 Presence of intraocular lens: Secondary | ICD-10-CM | POA: Diagnosis not present

## 2012-09-28 DIAGNOSIS — E11319 Type 2 diabetes mellitus with unspecified diabetic retinopathy without macular edema: Secondary | ICD-10-CM | POA: Diagnosis not present

## 2012-09-28 DIAGNOSIS — H35039 Hypertensive retinopathy, unspecified eye: Secondary | ICD-10-CM | POA: Diagnosis not present

## 2012-10-09 ENCOUNTER — Telehealth: Payer: Self-pay | Admitting: Oncology

## 2012-10-09 NOTE — Telephone Encounter (Signed)
R/s from 7/21 per pt rqst

## 2012-10-12 ENCOUNTER — Other Ambulatory Visit: Payer: Medicare Other | Admitting: Lab

## 2012-10-12 ENCOUNTER — Ambulatory Visit: Payer: Medicare Other

## 2012-10-13 ENCOUNTER — Ambulatory Visit (HOSPITAL_BASED_OUTPATIENT_CLINIC_OR_DEPARTMENT_OTHER): Payer: Medicare Other

## 2012-10-13 ENCOUNTER — Other Ambulatory Visit (HOSPITAL_BASED_OUTPATIENT_CLINIC_OR_DEPARTMENT_OTHER): Payer: Medicare Other | Admitting: Lab

## 2012-10-13 ENCOUNTER — Other Ambulatory Visit: Payer: Self-pay

## 2012-10-13 VITALS — BP 119/64 | HR 56 | Temp 98.0°F

## 2012-10-13 DIAGNOSIS — D638 Anemia in other chronic diseases classified elsewhere: Secondary | ICD-10-CM

## 2012-10-13 DIAGNOSIS — Z1231 Encounter for screening mammogram for malignant neoplasm of breast: Secondary | ICD-10-CM

## 2012-10-13 LAB — CBC WITH DIFFERENTIAL/PLATELET
BASO%: 0.5 % (ref 0.0–2.0)
Basophils Absolute: 0 10*3/uL (ref 0.0–0.1)
EOS%: 10 % — ABNORMAL HIGH (ref 0.0–7.0)
Eosinophils Absolute: 0.4 10*3/uL (ref 0.0–0.5)
HCT: 33.9 % — ABNORMAL LOW (ref 34.8–46.6)
HGB: 10.8 g/dL — ABNORMAL LOW (ref 11.6–15.9)
LYMPH%: 36.1 % (ref 14.0–49.7)
MCH: 26.5 pg (ref 25.1–34.0)
MCHC: 31.8 g/dL (ref 31.5–36.0)
MCV: 83.4 fL (ref 79.5–101.0)
MONO#: 0.3 10*3/uL (ref 0.1–0.9)
MONO%: 5.8 % (ref 0.0–14.0)
NEUT#: 2.1 10*3/uL (ref 1.5–6.5)
NEUT%: 47.6 % (ref 38.4–76.8)
Platelets: 210 10*3/uL (ref 145–400)
RBC: 4.06 10*6/uL (ref 3.70–5.45)
RDW: 14.6 % — ABNORMAL HIGH (ref 11.2–14.5)
WBC: 4.4 10*3/uL (ref 3.9–10.3)
lymph#: 1.6 10*3/uL (ref 0.9–3.3)

## 2012-10-13 MED ORDER — DARBEPOETIN ALFA-POLYSORBATE 300 MCG/0.6ML IJ SOLN
300.0000 ug | Freq: Once | INTRAMUSCULAR | Status: AC
  Administered 2012-10-13: 300 ug via SUBCUTANEOUS
  Filled 2012-10-13: qty 0.6

## 2012-10-28 ENCOUNTER — Ambulatory Visit
Admission: RE | Admit: 2012-10-28 | Discharge: 2012-10-28 | Disposition: A | Discharge status: Home / Self Care | Payer: Medicare Other | Source: Ambulatory Visit

## 2012-10-28 DIAGNOSIS — Z1231 Encounter for screening mammogram for malignant neoplasm of breast: Secondary | ICD-10-CM

## 2012-10-30 ENCOUNTER — Other Ambulatory Visit: Payer: Self-pay | Admitting: *Deleted

## 2012-10-30 DIAGNOSIS — E039 Hypothyroidism, unspecified: Secondary | ICD-10-CM

## 2012-10-30 DIAGNOSIS — IMO0001 Reserved for inherently not codable concepts without codable children: Secondary | ICD-10-CM | POA: Insufficient documentation

## 2012-11-03 ENCOUNTER — Other Ambulatory Visit: Payer: Medicare Other

## 2012-11-05 ENCOUNTER — Ambulatory Visit: Payer: Medicare Other | Admitting: Endocrinology

## 2012-11-05 ENCOUNTER — Other Ambulatory Visit (INDEPENDENT_AMBULATORY_CARE_PROVIDER_SITE_OTHER): Payer: Medicare Other

## 2012-11-05 DIAGNOSIS — E039 Hypothyroidism, unspecified: Secondary | ICD-10-CM

## 2012-11-05 DIAGNOSIS — IMO0001 Reserved for inherently not codable concepts without codable children: Secondary | ICD-10-CM

## 2012-11-05 LAB — URINALYSIS
Bilirubin Urine: NEGATIVE
Hgb urine dipstick: NEGATIVE
Ketones, ur: NEGATIVE
Leukocytes, UA: NEGATIVE
Nitrite: NEGATIVE
Specific Gravity, Urine: <=1.005 (ref 1.000–1.030)
Total Protein, Urine: NEGATIVE
Urine Glucose: NEGATIVE
Urobilinogen, UA: 0.2 (ref 0.0–1.0)
pH: 6 (ref 5.0–8.0)

## 2012-11-05 LAB — COMPREHENSIVE METABOLIC PANEL
ALT: 11 U/L (ref 0–35)
AST: 16 U/L (ref 0–37)
Albumin: 3.6 g/dL (ref 3.5–5.2)
Alkaline Phosphatase: 45 U/L (ref 39–117)
BUN: 17 mg/dL (ref 6–23)
CO2: 27 mEq/L (ref 19–32)
Calcium: 9 mg/dL (ref 8.4–10.5)
Chloride: 101 mEq/L (ref 96–112)
Creatinine, Ser: 1 mg/dL (ref 0.4–1.2)
GFR: 57.67 mL/min — ABNORMAL LOW (ref >60.00)
Glucose, Bld: 141 mg/dL — ABNORMAL HIGH (ref 70–99)
Potassium: 4.2 mEq/L (ref 3.5–5.1)
Sodium: 132 mEq/L — ABNORMAL LOW (ref 135–145)
Total Bilirubin: 1 mg/dL (ref 0.3–1.2)
Total Protein: 5.9 g/dL — ABNORMAL LOW (ref 6.0–8.3)

## 2012-11-05 LAB — HEMOGLOBIN A1C: Hgb A1c MFr Bld: 6.3 % (ref 4.6–6.5)

## 2012-11-05 LAB — TSH: TSH: 3.03 u[IU]/mL (ref 0.35–5.50)

## 2012-11-05 LAB — MICROALBUMIN / CREATININE URINE RATIO
Creatinine,U: 23.5 mg/dL
Microalb Creat Ratio: 7.2 mg/g (ref 0.0–30.0)
Microalb, Ur: 1.7 mg/dL (ref 0.0–1.9)

## 2012-11-05 LAB — T4, FREE: Free T4: 1.34 ng/dL (ref 0.60–1.60)

## 2012-11-09 DIAGNOSIS — M79609 Pain in unspecified limb: Secondary | ICD-10-CM | POA: Diagnosis not present

## 2012-11-09 DIAGNOSIS — B351 Tinea unguium: Secondary | ICD-10-CM | POA: Diagnosis not present

## 2012-11-10 ENCOUNTER — Ambulatory Visit (INDEPENDENT_AMBULATORY_CARE_PROVIDER_SITE_OTHER): Payer: Medicare Other | Admitting: Endocrinology

## 2012-11-10 ENCOUNTER — Encounter: Payer: Self-pay | Admitting: Endocrinology

## 2012-11-10 VITALS — BP 120/42 | HR 60 | Temp 98.3°F | Resp 10 | Ht 60.0 in | Wt 113.6 lb

## 2012-11-10 DIAGNOSIS — R609 Edema, unspecified: Secondary | ICD-10-CM | POA: Diagnosis not present

## 2012-11-10 DIAGNOSIS — IMO0001 Reserved for inherently not codable concepts without codable children: Secondary | ICD-10-CM | POA: Diagnosis not present

## 2012-11-10 DIAGNOSIS — I10 Essential (primary) hypertension: Secondary | ICD-10-CM | POA: Diagnosis not present

## 2012-11-10 DIAGNOSIS — E039 Hypothyroidism, unspecified: Secondary | ICD-10-CM

## 2012-11-10 DIAGNOSIS — D649 Anemia, unspecified: Secondary | ICD-10-CM

## 2012-11-10 NOTE — Progress Notes (Signed)
Patient ID: Abigail Hoover, female   DOB: [birth year], 67 y.o.   MRN: [id number] Abigail Hoover is an 67 y.o. female.   Reason for Appointment: Diabetes follow-up   History of Present Illness   Diagnosis: Type 2 DIABETES MELITUS, long-standing  She has had mild diabetes for several years but has required multiple drugs for control Usually has had postprandial hyperglycemia, this has been better controlled with adding Glyset to her main meals Has never been on insulin or a GLP-1 drug Generally not eating much at breakfast Not clear why she is starting to get occasional low blood sugars early-morning including yesterday with symptoms of sweating and glucose of 54 Her blood sugars at bedtime are usually near normal as also readings during the day    Oral hypoglycemic drugs: Actos 15 mg, Glyset, Janumet 50/1006        Side effects from medications: None Proper timing of medications in relation to meals: Yes.          Monitors blood glucose: Once a day.    Glucometer: One Touch, checking blood sugars nearly 2 times a day.          Blood Glucose readings from meter download: readings before breakfast:  Median 83 with range 54-116, nonfasting 58-162 with median at bedtime is 141 Overall median 102 Hypoglycemia frequency:  2-3 times a month, has had low sugars in the 50s at either 4 AM or 4 PM .           Meals: 2- 3 meals per day.          Physical activity: exercise: None and not motivated despite repeated reminders           Dietician visit: Most recent: Unknown       Complications: are: No significant complications      Wt Readings from Last 3 Encounters:  11/10/12 113 lb 9.6 oz (51.529 kg)  09/02/12 111 lb (50.349 kg)  07/06/12 112 lb 1.6 oz (50.848 kg)    Appointment on 11/05/2012  Component Date Value Range Status  . Hemoglobin A1C 11/05/2012 6.3  4.6 - 6.5 % Final   Glycemic Control Guidelines for People with Diabetes:Non Diabetic:  <6%Goal of Therapy: <7%Additional Action  Suggested:  >8%   . Sodium 11/05/2012 132* 135 - 145 mEq/L Final  . Potassium 11/05/2012 4.2  3.5 - 5.1 mEq/L Final  . Chloride 11/05/2012 101  96 - 112 mEq/L Final  . CO2 11/05/2012 27  19 - 32 mEq/L Final  . Glucose, Bld 11/05/2012 141* 70 - 99 mg/dL Final  . BUN 11/05/2012 17  6 - 23 mg/dL Final  . Creatinine, Ser 11/05/2012 1.0  0.4 - 1.2 mg/dL Final  . Total Bilirubin 11/05/2012 1.0  0.3 - 1.2 mg/dL Final  . Alkaline Phosphatase 11/05/2012 45  39 - 117 U/L Final  . AST 11/05/2012 16  0 - 37 U/L Final  . ALT 11/05/2012 11  0 - 35 U/L Final  . Total Protein 11/05/2012 5.9* 6.0 - 8.3 g/dL Final  . Albumin 11/05/2012 3.6  3.5 - 5.2 g/dL Final  . Calcium 11/05/2012 9.0  8.4 - 10.5 mg/dL Final  . GFR 11/05/2012 57.67* >60.00 mL/min Final  . Color, Urine 11/05/2012 LT. YELLOW  Yellow;Lt. Yellow Final  . APPearance 11/05/2012 CLEAR  Clear Final  . Specific Gravity, Urine 11/05/2012 <=1.005  1.000 - 1.030 Final  . pH 11/05/2012 6.0  5.0 - 8.0 Final  . Total Protein, Urine 11/05/2012  NEGATIVE  Negative Final  . Urine Glucose 11/05/2012 NEGATIVE  Negative Final  . Ketones, ur 11/05/2012 NEGATIVE  Negative Final  . Bilirubin Urine 11/05/2012 NEGATIVE  Negative Final  . Hgb urine dipstick 11/05/2012 NEGATIVE  Negative Final  . Urobilinogen, UA 11/05/2012 0.2  0.0 - 1.0 Final  . Leukocytes, UA 11/05/2012 NEGATIVE  Negative Final  . Nitrite 11/05/2012 NEGATIVE  Negative Final  . Microalb, Ur 11/05/2012 1.7  0.0 - 1.9 mg/dL Final  . Creatinine,U 11/05/2012 23.5   Final  . Microalb Creat Ratio 11/05/2012 7.2  0.0 - 30.0 mg/g Final  . Free T4 11/05/2012 1.34  0.60 - 1.60 ng/dL Final  . TSH 11/05/2012 3.03  0.35 - 5.50 uIU/mL Final      Medication List       This list is accurate as of: 11/10/12 10:36 AM.  Always use your most recent med list.               amLODipine 10 MG tablet  Commonly known as:  NORVASC  Take 10 mg by mouth daily.     aspirin 81 MG tablet  Take 81 mg by mouth  daily.     DEXILANT 60 MG capsule  Generic drug:  dexlansoprazole  Take 60 mg by mouth daily.     ferrous sulfate 325 (65 FE) MG tablet  Take 325 mg by mouth 3 (three) times daily with meals.     furosemide 20 MG tablet  Commonly known as:  LASIX  Take 20 mg by mouth daily as needed.     gabapentin 300 MG capsule  Commonly known as:  NEURONTIN  Take 300 mg by mouth 3 (three) times daily.     losartan 100 MG tablet  Commonly known as:  COZAAR     metoprolol succinate 100 MG 24 hr tablet  Commonly known as:  TOPROL-XL  Take 100 mg by mouth daily.     miglitol 25 MG tablet  Commonly known as:  GLYSET  Take 50 mg by mouth daily. One tablet by mouth once daily     MYRBETRIQ 25 MG Tb24 tablet  Generic drug:  mirabegron ER  Take 1 tablet by mouth Daily.     pentosan polysulfate 100 MG capsule  Commonly known as:  ELMIRON  Take 100 mg by mouth 3 (three) times daily before meals.     pioglitazone 15 MG tablet  Commonly known as:  ACTOS  15 mg.     sitaGLIPtan-metformin 50-1000 MG per tablet  Commonly known as:  JANUMET  Take 2 tablets by mouth daily.     SYNTHROID 75 MCG tablet  Generic drug:  levothyroxine  Take 88 mcg by mouth daily.     triamcinolone cream 0.1 %  Commonly known as:  KENALOG     Vitamin D3 1000 UNITS Caps  Take 1,000 Units by mouth daily.     WELCHOL 625 MG tablet  Generic drug:  colesevelam  Take 3,750 mg by mouth daily.        Allergies:  Allergies  Allergen Reactions  . Ezetimibe Itching  . Atorvastatin     REACTION: rash    Past Medical History  Diagnosis Date  . Diabetes mellitus   . Hypertension   . Thyroid disease   . Arthritis   . Anemia   . GERD (gastroesophageal reflux disease)   . Hypercholesterolemia   . Bladder irritation   . Iron deficiency anemia, unspecified   . Hypothyroidism   .  Anxiety   . Psoriasis     Past Surgical History  Procedure Laterality Date  . Abdominal hysterectomy      TAH  . Cervical  disc surgery    . Cataract extraction    . Breast biopsy      Left breast biopsy benign lesion  . Colonoscopy  2013    neg. next 2023.   Marland Kitchen Eye surgery    . Breast biopsy  03/11/2012    Procedure: BREAST BIOPSY;  Surgeon: Odis Hollingshead, MD;  Location: [facility];  Service: General;  Laterality: Left;  remove left breast mass    Family History  Problem Relation Age of Onset  . Breast cancer Sister   . Cancer Sister     breast cancer  . Heart disease Mother     heart attack  . Stroke Father     brain hemorrhage  . Diabetes Brother   . Cancer Brother     Social History:  reports that she has never smoked. She has never used smokeless tobacco. She reports that she does not drink alcohol or use illicit drugs.  Review of Systems:  HYPERTENSION:  this is well-controlled with half tablet of 10 mg amlodipine along with losartan metoprolol, also follows up with cardiologist  HYPERLIPIDEMIA: The lipid abnormality consists of elevated LDL which is mild and well controlled with WelChol.  She has a long history of hypothyroidism which has been fairly stable and TSH consistently normal with 75 mcg recently  She does still have problems with pedal edema of unclear etiology, this is worse in the evenings but mild and resolves overnight  No recent problems with numbness or tinging in her feet  She has had a previous history of hypercalcemia of unclear etiology which is now resolved  History of iron deficiency anemia     Examination:   BP 120/42  Pulse 60  Temp(Src) 98.3 F (36.8 C)  Resp 10  Ht 5' (1.524 m)  Wt 113 lb 9.6 oz (51.529 kg)  BMI 22.19 kg/m2  SpO2 99%  Body mass index is 22.19 kg/(m^2).   ASSESSMENT/ PLAN::   Diabetes type 2   The patient's diabetes control appears to be well controlled with current regimen with excellent A1c. However she appears to be having hypoglycemia which is new problem and not clear etiology Her renal and hepatic function is normal and most  likely is related to metformin since it is occurring early morning Discussed that we can reduce her metformin dose by half since her blood sugars are otherwise well controlled Will continue Actos to help with her insulin resistance  HYPERTENSION: Blood pressure is excellent without orthostatic symptoms, consider reducing dose of amlodipine further especially since she has some venous edema  Hypothyroidism: Adequately replaced with normal TSH  History of weight loss: Resolved along with prior hypercalcemia  Azula Zappia 11/10/2012, 10:36 AM

## 2012-11-10 NOTE — Patient Instructions (Addendum)
Janumet only in am only and stop evening dose; Take Actos daily at night  Walk daily  Please check blood sugars at least half the time about 2 hours after any meal and 2x per week on waking up. Please bring blood sugar monitor to each visit

## 2012-11-11 ENCOUNTER — Ambulatory Visit (INDEPENDENT_AMBULATORY_CARE_PROVIDER_SITE_OTHER): Payer: Medicare Other | Admitting: Ophthalmology

## 2012-11-11 DIAGNOSIS — E1139 Type 2 diabetes mellitus with other diabetic ophthalmic complication: Secondary | ICD-10-CM | POA: Diagnosis not present

## 2012-11-11 DIAGNOSIS — E11319 Type 2 diabetes mellitus with unspecified diabetic retinopathy without macular edema: Secondary | ICD-10-CM | POA: Diagnosis not present

## 2012-11-11 DIAGNOSIS — H43819 Vitreous degeneration, unspecified eye: Secondary | ICD-10-CM

## 2012-11-11 DIAGNOSIS — H35039 Hypertensive retinopathy, unspecified eye: Secondary | ICD-10-CM | POA: Diagnosis not present

## 2012-11-11 DIAGNOSIS — I10 Essential (primary) hypertension: Secondary | ICD-10-CM | POA: Diagnosis not present

## 2012-11-13 ENCOUNTER — Ambulatory Visit (INDEPENDENT_AMBULATORY_CARE_PROVIDER_SITE_OTHER): Payer: Medicare Other | Admitting: Ophthalmology

## 2012-11-23 ENCOUNTER — Other Ambulatory Visit: Payer: Medicare Other | Admitting: Lab

## 2012-11-23 ENCOUNTER — Ambulatory Visit: Payer: Medicare Other

## 2012-11-24 ENCOUNTER — Other Ambulatory Visit (HOSPITAL_BASED_OUTPATIENT_CLINIC_OR_DEPARTMENT_OTHER): Payer: Medicare Other | Admitting: Lab

## 2012-11-24 ENCOUNTER — Ambulatory Visit (HOSPITAL_BASED_OUTPATIENT_CLINIC_OR_DEPARTMENT_OTHER): Payer: Medicare Other

## 2012-11-24 VITALS — BP 145/52 | HR 59 | Temp 97.6°F

## 2012-11-24 DIAGNOSIS — D638 Anemia in other chronic diseases classified elsewhere: Secondary | ICD-10-CM | POA: Diagnosis not present

## 2012-11-24 LAB — CBC WITH DIFFERENTIAL/PLATELET
BASO%: 0.5 % (ref 0.0–2.0)
Basophils Absolute: 0 10*3/uL (ref 0.0–0.1)
EOS%: 11.1 % — ABNORMAL HIGH (ref 0.0–7.0)
Eosinophils Absolute: 0.5 10*3/uL (ref 0.0–0.5)
HCT: 31.5 % — ABNORMAL LOW (ref 34.8–46.6)
HGB: 10 g/dL — ABNORMAL LOW (ref 11.6–15.9)
LYMPH%: 28.2 % (ref 14.0–49.7)
MCH: 27 pg (ref 25.1–34.0)
MCHC: 31.9 g/dL (ref 31.5–36.0)
MCV: 84.5 fL (ref 79.5–101.0)
MONO#: 0.3 10*3/uL (ref 0.1–0.9)
MONO%: 6.2 % (ref 0.0–14.0)
NEUT#: 2.6 10*3/uL (ref 1.5–6.5)
NEUT%: 54 % (ref 38.4–76.8)
Platelets: 180 10*3/uL (ref 145–400)
RBC: 3.72 10*6/uL (ref 3.70–5.45)
RDW: 15.8 % — ABNORMAL HIGH (ref 11.2–14.5)
WBC: 4.8 10*3/uL (ref 3.9–10.3)
lymph#: 1.3 10*3/uL (ref 0.9–3.3)

## 2012-11-24 MED ORDER — DARBEPOETIN ALFA-POLYSORBATE 300 MCG/0.6ML IJ SOLN
300.0000 ug | Freq: Once | INTRAMUSCULAR | Status: AC
  Administered 2012-11-24: 300 ug via SUBCUTANEOUS
  Filled 2012-11-24: qty 0.6

## 2012-12-07 ENCOUNTER — Telehealth: Payer: Self-pay | Admitting: *Deleted

## 2012-12-07 ENCOUNTER — Encounter: Payer: Self-pay | Admitting: Oncology

## 2012-12-07 ENCOUNTER — Ambulatory Visit (HOSPITAL_BASED_OUTPATIENT_CLINIC_OR_DEPARTMENT_OTHER): Payer: Medicare Other | Admitting: Oncology

## 2012-12-07 ENCOUNTER — Other Ambulatory Visit: Payer: Medicare Other | Admitting: Lab

## 2012-12-07 VITALS — BP 136/44 | HR 57 | Temp 97.6°F | Resp 17 | Ht 60.0 in | Wt 112.2 lb

## 2012-12-07 DIAGNOSIS — D638 Anemia in other chronic diseases classified elsewhere: Secondary | ICD-10-CM | POA: Diagnosis not present

## 2012-12-07 NOTE — Telephone Encounter (Signed)
appts made and printed...td 

## 2012-12-07 NOTE — Progress Notes (Signed)
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   CURRENT THERAPY:  Aranesp for Hgb <11.   INTERVAL HISTORY: Abigail Hoover 67 y.o. female returns for regular follow up with her grandson. She reports feeling well on Aranesp.  She has improved stamina, performance status, qualify of life. She denied fever, anorexia, weight loss, visible bleeding, chest pain, SOB, abd pain, diarrhea/constipation, nausea/vomiting.  The rest of the 14-point review of system was negative.   Past Medical History  Diagnosis Date  . Diabetes mellitus   . Hypertension   . Thyroid disease   . Arthritis   . Anemia   . GERD (gastroesophageal reflux disease)   . Hypercholesterolemia   . Bladder irritation   . Iron deficiency anemia, unspecified   . Hypothyroidism   . Anxiety   . Psoriasis     Past Surgical History  Procedure Laterality Date  . Abdominal hysterectomy      TAH  . Cervical disc surgery    . Cataract extraction    . Breast biopsy      Left breast biopsy benign lesion  . Colonoscopy  2013    neg. next 2023.   Marland Kitchen Eye surgery    . Breast biopsy  03/11/2012    Procedure: BREAST BIOPSY;  Surgeon: Odis Hollingshead, MD;  Location: [facility];  Service: General;  Laterality: Left;  remove left breast mass    Current Outpatient Prescriptions  Medication Sig Dispense Refill  . amLODipine (NORVASC) 10 MG tablet Take 10 mg by mouth daily.        Marland Kitchen aspirin 81 MG tablet Take 81 mg by mouth daily.        . Cholecalciferol (VITAMIN D3) 1000 UNITS CAPS Take 1,000 Units by mouth daily.      . colesevelam (WELCHOL) 625 MG tablet Take 3,750 mg by mouth daily.       Marland Kitchen dexlansoprazole (DEXILANT) 60 MG capsule Take 60 mg by mouth daily.        . ferrous sulfate 325 (65 FE) MG tablet Take 325 mg by mouth 3 (three) times daily with meals.       . furosemide (LASIX) 20 MG tablet  Take 20 mg by mouth daily as needed.      . gabapentin (NEURONTIN) 300 MG capsule Take 300 mg by mouth 3 (three) times daily.       Marland Kitchen levothyroxine (SYNTHROID) 75 MCG tablet Take 88 mcg by mouth daily.       Marland Kitchen losartan (COZAAR) 100 MG tablet       . metoprolol (TOPROL-XL) 100 MG 24 hr tablet Take 100 mg by mouth daily.       . miglitol (GLYSET) 25 MG tablet Take 50 mg by mouth daily. One tablet by mouth once daily      . MYRBETRIQ 25 MG TB24 Take 1 tablet by mouth Daily.      . pentosan polysulfate (ELMIRON) 100 MG capsule Take 100 mg by mouth 3 (three) times daily before meals.       . pioglitazone (ACTOS) 15 MG tablet 15 mg.      . sitaGLIPtan-metformin (JANUMET) 50-1000 MG per tablet Take 2 tablets by mouth daily.       Marland Kitchen triamcinolone cream (KENALOG) 0.1 %        No current facility-administered medications for this visit.  ALLERGIES:  is allergic to ezetimibe and atorvastatin.  REVIEW OF SYSTEMS:  The rest of the 14-point review of system was negative.   Filed Vitals:   12/07/12 1205  BP: 136/44  Pulse: 57  Temp: 97.6 F (36.4 C)  Resp: 17   Wt Readings from Last 3 Encounters:  12/07/12 112 lb 3.2 oz (50.894 kg)  11/10/12 113 lb 9.6 oz (51.529 kg)  09/02/12 111 lb (50.349 kg)   ECOG Performance status: 0  PHYSICAL EXAMINATION:   General: thin-appearing woman, in no acute distress.  Eyes:  no scleral icterus.  ENT:  There were no oropharyngeal lesions.  Neck was without thyromegaly.  Lymphatics:  Negative cervical, supraclavicular or axillary adenopathy.  Respiratory: lungs were clear bilaterally without wheezing or crackles.  Cardiovascular:  Regular rate and rhythm, S1/S2, without murmur, rub or gallop.  There was no pedal edema.  GI:  abdomen was soft, flat, nontender, nondistended, without organomegaly.  Muscoloskeletal:  no spinal tenderness of palpation of vertebral spine.  Skin exam was without echymosis, petichae.  Neuro exam was nonfocal.  Patient was able to get on  and off exam table without assistance.  Gait was normal.  Patient was alert and oriented.  Attention was good.   Language was appropriate.  Mood was normal without depression.  Speech was not pressured.  Thought content was not tangential.     LABORATORY/RADIOLOGY DATA:  Lab Results  Component Value Date   WBC 4.8 11/24/2012   HGB 10.0* 11/24/2012   HCT 31.5* 11/24/2012   PLT 180 11/24/2012   GLUCOSE 141* 11/05/2012   ALKPHOS 45 11/05/2012   ALT 11 11/05/2012   AST 16 11/05/2012   NA 132* 11/05/2012   K 4.2 11/05/2012   CL 101 11/05/2012   CREATININE 1.0 11/05/2012   BUN 17 11/05/2012   CO2 27 11/05/2012   INR 1.04 03/10/2012   HGBA1C 6.3 11/05/2012   MICROALBUR 1.7 11/05/2012    ASSESSMENT AND PLAN:   1.  DM:  On Miglitol, Sitagliptan-metformin per PCP.  2.  Hyperlipidemia:  On colesevelam per PCP. 3.  Hypertension:  On amlodipine, losartan, metoprolol per PCP.  4.  Hypothyroidism:  On Levothyroxine.  5.  Chronic anemia most likely due to chronic disease.  - Work up in the past were negative including bone marrow biopsy. -  Continue Aranesp SQ injection every 6 weeks for Hgb <11.  I have again reviewed that there is a slightly increased risk of stroke and heart attack with Aranesp.  Ms. Bowlen and her husband expressed informed understanding of the risk and benefit of Aranesp.  She said that before starting Aranesp, she had chronic fatigue.  With Aranesp and improved Hgb, she has been able to function better, improved stamina, able to travel to [place], improved quality of life.  She would like to continue Aranesp but every 6 weeks instead for Hgb <11.  She last received Aranesp about 2 weeks ago and did not want recheck CBC.  As such, she will return to clinic in about 4 weeks to continue Aranesp.     The length of time of the face-to-face encounter was 15 minutes. More than 50% of time was spent counseling and coordination of care.

## 2012-12-09 ENCOUNTER — Telehealth: Payer: Self-pay | Admitting: *Deleted

## 2012-12-09 NOTE — Telephone Encounter (Signed)
Pt wanted you to know that she is taking the Actos, Glyset and Janumet, she said if she doesn't take the Janumet in the morning her sugars run high, so she takes that in the morning and the others in the afternoon.

## 2012-12-09 NOTE — Telephone Encounter (Signed)
Noted  

## 2012-12-11 ENCOUNTER — Other Ambulatory Visit: Payer: Self-pay | Admitting: *Deleted

## 2012-12-11 MED ORDER — PIOGLITAZONE HCL 15 MG PO TABS: 15.0000 mg | ORAL_TABLET | Freq: Every day | ORAL | Status: DC

## 2012-12-29 ENCOUNTER — Ambulatory Visit (INDEPENDENT_AMBULATORY_CARE_PROVIDER_SITE_OTHER): Payer: Medicare Other

## 2012-12-29 DIAGNOSIS — Z23 Encounter for immunization: Secondary | ICD-10-CM

## 2013-01-04 ENCOUNTER — Other Ambulatory Visit: Payer: Self-pay | Admitting: Hematology and Oncology

## 2013-01-04 ENCOUNTER — Other Ambulatory Visit (HOSPITAL_BASED_OUTPATIENT_CLINIC_OR_DEPARTMENT_OTHER): Payer: Medicare Other | Admitting: Lab

## 2013-01-04 ENCOUNTER — Other Ambulatory Visit: Payer: Medicare Other | Admitting: Lab

## 2013-01-04 ENCOUNTER — Ambulatory Visit: Payer: Medicare Other

## 2013-01-04 ENCOUNTER — Encounter (INDEPENDENT_AMBULATORY_CARE_PROVIDER_SITE_OTHER): Payer: Self-pay

## 2013-01-04 ENCOUNTER — Other Ambulatory Visit: Payer: Medicare Other

## 2013-01-04 DIAGNOSIS — D638 Anemia in other chronic diseases classified elsewhere: Secondary | ICD-10-CM | POA: Diagnosis not present

## 2013-01-04 LAB — CBC WITH DIFFERENTIAL/PLATELET
BASO%: 0.4 % (ref 0.0–2.0)
Basophils Absolute: 0 10*3/uL (ref 0.0–0.1)
EOS%: 10.1 % — ABNORMAL HIGH (ref 0.0–7.0)
Eosinophils Absolute: 0.6 10*3/uL — ABNORMAL HIGH (ref 0.0–0.5)
HCT: 36.7 % (ref 34.8–46.6)
HGB: 11.9 g/dL (ref 11.6–15.9)
LYMPH%: 24.6 % (ref 14.0–49.7)
MCH: 27.9 pg (ref 25.1–34.0)
MCHC: 32.3 g/dL (ref 31.5–36.0)
MCV: 86.2 fL (ref 79.5–101.0)
MONO#: 0.4 10*3/uL (ref 0.1–0.9)
MONO%: 6 % (ref 0.0–14.0)
NEUT#: 3.7 10*3/uL (ref 1.5–6.5)
NEUT%: 58.9 % (ref 38.4–76.8)
Platelets: 231 10*3/uL (ref 145–400)
RBC: 4.26 10*6/uL (ref 3.70–5.45)
RDW: 14.6 % — ABNORMAL HIGH (ref 11.2–14.5)
WBC: 6.3 10*3/uL (ref 3.9–10.3)
lymph#: 1.6 10*3/uL (ref 0.9–3.3)

## 2013-01-04 MED ORDER — DARBEPOETIN ALFA-POLYSORBATE 300 MCG/0.6ML IJ SOLN: 300.0000 ug | Freq: Once | INTRAMUSCULAR | Status: DC

## 2013-01-06 ENCOUNTER — Telehealth: Payer: Self-pay | Admitting: Hematology and Oncology

## 2013-01-06 NOTE — Telephone Encounter (Signed)
, °

## 2013-01-27 ENCOUNTER — Telehealth: Payer: Self-pay | Admitting: Endocrinology

## 2013-01-27 ENCOUNTER — Other Ambulatory Visit: Payer: Self-pay | Admitting: *Deleted

## 2013-01-27 MED ORDER — FUROSEMIDE 20 MG PO TABS: 20.0000 mg | ORAL_TABLET | Freq: Every day | ORAL | Status: DC | PRN

## 2013-01-27 MED ORDER — COLESEVELAM HCL 625 MG PO TABS: 3750.0000 mg | ORAL_TABLET | Freq: Every day | ORAL | Status: DC

## 2013-01-27 NOTE — Telephone Encounter (Signed)
I called patient back, she said nothing about having low blood sugars, she just wanted her prescriptions called into her pharmacy.

## 2013-01-28 ENCOUNTER — Encounter: Payer: Self-pay | Admitting: Endocrinology

## 2013-01-28 ENCOUNTER — Other Ambulatory Visit: Payer: Self-pay | Admitting: *Deleted

## 2013-01-28 ENCOUNTER — Ambulatory Visit (INDEPENDENT_AMBULATORY_CARE_PROVIDER_SITE_OTHER): Payer: Medicare Other | Admitting: Endocrinology

## 2013-01-28 VITALS — BP 118/52 | HR 57 | Temp 98.2°F | Resp 10 | Ht 60.0 in | Wt 116.2 lb

## 2013-01-28 DIAGNOSIS — R300 Dysuria: Secondary | ICD-10-CM | POA: Diagnosis not present

## 2013-01-28 DIAGNOSIS — E1169 Type 2 diabetes mellitus with other specified complication: Secondary | ICD-10-CM

## 2013-01-28 DIAGNOSIS — IMO0001 Reserved for inherently not codable concepts without codable children: Secondary | ICD-10-CM | POA: Diagnosis not present

## 2013-01-28 DIAGNOSIS — E11649 Type 2 diabetes mellitus with hypoglycemia without coma: Secondary | ICD-10-CM

## 2013-01-28 MED ORDER — PIOGLITAZONE HCL 15 MG PO TABS: 15.0000 mg | ORAL_TABLET | Freq: Every day | ORAL | Status: DC

## 2013-01-28 NOTE — Patient Instructions (Signed)
Take Janumet 50/1000 only in am  Start Januvia 1/2 tab of 100mg  before dinner  When above Janumet out will Rx 100/1000 XR once daily

## 2013-01-28 NOTE — Progress Notes (Signed)
Patient ID: Abigail Hoover, female   DOB: Dec 28, 1945, 67 y.o.   MRN: [id number]    Reason for Appointment: Diabetes follow-up/low blood sugars   History of Present Illness   Diagnosis: Type 2 DIABETES MELITUS, long-standing  She has had mild diabetes for several years but has required multiple drugs for control Usually has had postprandial hyperglycemia, this has been better controlled with adding Glyset to her main meals Has never been on insulin or a GLP-1 drug  On her last visit because of relatively low sugars in the mornings including mild hypoglycemia her Janumet was reduced to 1 tablet daily instead of 2  However she thinks that subsequently her blood sugars went up to 160 at night and she went back to 2 tablets a day Again she came in because of a glucose of 52 yesterday without any symptoms  Her blood sugars after supper are usually near normal with only rare high readings and median 120  Oral hypoglycemic drugs: Actos 15 mg, Glyset, Janumet 50/1000       Side effects from medications: None Proper timing of medications in relation to meals: Yes.          Monitors blood glucose: Once a day.    Glucometer: One Touch, checking blood sugars 1.1 times a day.          Blood Glucose readings from meter download: readings before breakfast: 52-115 with median about 85 During the day 89, 157, 122; after supper 74-184 with median 120 Hypoglycemia: Asymptomatic reading of 52 yesterday           Meals: 2- 3 meals per day.  Generally not eating much at breakfast        Physical activity: exercise: None and not motivated despite repeated reminders           Dietician visit: Most recent: Unknown       Complications: are: No significant complications      Wt Readings from Last 3 Encounters:  01/28/13 116 lb 3.2 oz (52.708 kg)  12/07/12 112 lb 3.2 oz (50.894 kg)  11/10/12 113 lb 9.6 oz (51.529 kg)   Lab Results  Component Value Date   HGBA1C 6.3 11/05/2012   HGBA1C 5.3 08/01/2009    HGBA1C  Value: 6.2 (NOTE) The ADA recommends the following therapeutic goal for glycemic control related to Hgb A1c measurement: Goal of therapy: <6.5 Hgb A1c  Reference: American Diabetes Association: Clinical Practice Recommendations 2010, Diabetes Care, 2010, 33: (Suppl  1).* 01/02/2009   Lab Results  Component Value Date   MICROALBUR 1.7 11/05/2012   CREATININE 1.0 11/05/2012     No visits with results within 1 Week(s) from this visit. Latest known visit with results is:  Appointment on 01/04/2013  Component Date Value Range Status  . WBC 01/04/2013 6.3  3.9 - 10.3 10e3/uL Final  . NEUT# 01/04/2013 3.7  1.5 - 6.5 10e3/uL Final  . HGB 01/04/2013 11.9  11.6 - 15.9 g/dL Final  . HCT 01/04/2013 36.7  34.8 - 46.6 % Final  . Platelets 01/04/2013 231  145 - 400 10e3/uL Final  . MCV 01/04/2013 86.2  79.5 - 101.0 fL Final  . MCH 01/04/2013 27.9  25.1 - 34.0 pg Final  . MCHC 01/04/2013 32.3  31.5 - 36.0 g/dL Final  . RBC 01/04/2013 4.26  3.70 - 5.45 10e6/uL Final  . RDW 01/04/2013 14.6* 11.2 - 14.5 % Final  . lymph# 01/04/2013 1.6  0.9 - 3.3 10e3/uL Final  . MONO#  01/04/2013 0.4  0.1 - 0.9 10e3/uL Final  . Eosinophils Absolute 01/04/2013 0.6* 0.0 - 0.5 10e3/uL Final  . Basophils Absolute 01/04/2013 0.0  0.0 - 0.1 10e3/uL Final  . NEUT% 01/04/2013 58.9  38.4 - 76.8 % Final  . LYMPH% 01/04/2013 24.6  14.0 - 49.7 % Final  . MONO% 01/04/2013 6.0  0.0 - 14.0 % Final  . EOS% 01/04/2013 10.1* 0.0 - 7.0 % Final  . BASO% 01/04/2013 0.4  0.0 - 2.0 % Final      Medication List       This list is accurate as of: 01/28/13  4:35 PM.  Always use your most recent med list.               amLODipine 10 MG tablet  Commonly known as:  NORVASC  Take 10 mg by mouth daily.     aspirin 81 MG tablet  Take 81 mg by mouth daily.     CARAFATE 1 GM/10ML suspension  Generic drug:  sucralfate     colesevelam 625 MG tablet  Commonly known as:  WELCHOL  Take 6 tablets (3,750 mg total) by mouth daily.      DEXILANT 60 MG capsule  Generic drug:  dexlansoprazole  Take 60 mg by mouth daily.     ferrous sulfate 325 (65 FE) MG tablet  Take 325 mg by mouth 3 (three) times daily with meals.     furosemide 20 MG tablet  Commonly known as:  LASIX  Take 1 tablet (20 mg total) by mouth daily as needed.     gabapentin 300 MG capsule  Commonly known as:  NEURONTIN  Take 300 mg by mouth 3 (three) times daily.     losartan 100 MG tablet  Commonly known as:  COZAAR     LOTEMAX 0.5 % Gel  Generic drug:  Loteprednol Etabonate     metoprolol succinate 100 MG 24 hr tablet  Commonly known as:  TOPROL-XL  Take 100 mg by mouth daily.     miglitol 25 MG tablet  Commonly known as:  GLYSET  Take 50 mg by mouth daily. One tablet by mouth once daily     MYRBETRIQ 25 MG Tb24 tablet  Generic drug:  mirabegron ER  Take 1 tablet by mouth Daily.     pentosan polysulfate 100 MG capsule  Commonly known as:  ELMIRON  Take 100 mg by mouth 3 (three) times daily before meals.     pioglitazone 15 MG tablet  Commonly known as:  ACTOS  Take 1 tablet (15 mg total) by mouth daily.     sitaGLIPtin-metformin 50-1000 MG per tablet  Commonly known as:  JANUMET  Take 2 tablets by mouth daily.     SYNTHROID 75 MCG tablet  Generic drug:  levothyroxine  Take 88 mcg by mouth daily.     triamcinolone cream 0.1 %  Commonly known as:  KENALOG     Vitamin D3 1000 UNITS Caps  Take 1,000 Units by mouth daily.        Allergies:  Allergies  Allergen Reactions  . Ezetimibe Itching  . Atorvastatin     REACTION: rash    Past Medical History  Diagnosis Date  . Diabetes mellitus   . Hypertension   . Thyroid disease   . Arthritis   . Anemia   . GERD (gastroesophageal reflux disease)   . Hypercholesterolemia   . Bladder irritation   . Iron deficiency anemia, unspecified   . Hypothyroidism   .  Anxiety   . Psoriasis     Past Surgical History  Procedure Laterality Date  . Abdominal hysterectomy       TAH  . Cervical disc surgery    . Cataract extraction    . Breast biopsy      Left breast biopsy benign lesion  . Colonoscopy  2013    neg. next 2023.   Marland Kitchen Eye surgery    . Breast biopsy  03/11/2012    Procedure: BREAST BIOPSY;  Surgeon: Odis Hollingshead, MD;  Location: [facility];  Service: General;  Laterality: Left;  remove left breast mass    Family History  Problem Relation Age of Onset  . Breast cancer Sister   . Cancer Sister     breast cancer  . Heart disease Mother     heart attack  . Stroke Father     brain hemorrhage  . Diabetes Brother   . Cancer Brother     Social History:  reports that she has never smoked. She has never used smokeless tobacco. She reports that she does not drink alcohol or use illicit drugs.  Review of Systems:  HYPERTENSION:  this is well-controlled with half tablet of 10 mg amlodipine along with losartan metoprolol, also follows up with cardiologist  HYPERLIPIDEMIA: The lipid abnormality consists of elevated LDL which is mild and well controlled with WelChol.  She has a long history of hypothyroidism which has been fairly stable and TSH consistently normal with 75 mcg recently  Asking about recent burning with urination  She has had a previous history of hypercalcemia of unclear etiology which is now resolved  History of iron deficiency anemia     Examination:   BP 118/52  Pulse 57  Temp(Src) 98.2 F (36.8 C)  Resp 10  Ht 5' (1.524 m)  Wt 116 lb 3.2 oz (52.708 kg)  BMI 22.69 kg/m2  SpO2 97%  Body mass index is 22.69 kg/(m^2).   ASSESSMENT/ PLAN::   Diabetes type 2   The patient's diabetes control appears to be well controlled with current regimen  However she has low normal morning readings again with increased metformin regimen and asymptomatic low glucose of 52 yesterday She was again advised to cut back on Janumet to 50/1000 mg a day and was given a sample of anemia to take 50 mg separately for now Next prescription will be  for 100/1000 daily  Will continue Actos to help with her insulin resistance  Dysuria: Will check urinalysis  To followup as scheduled in about a month  Tarance Balan 01/28/2013, 4:35 PM

## 2013-02-01 LAB — URINALYSIS, ROUTINE W REFLEX MICROSCOPIC
Bilirubin Urine: NEGATIVE
Hgb urine dipstick: NEGATIVE
Ketones, ur: NEGATIVE
Leukocytes, UA: NEGATIVE
Nitrite: NEGATIVE
RBC / HPF: NONE SEEN (ref 0–?)
Specific Gravity, Urine: 1.01 (ref 1.000–1.030)
Total Protein, Urine: NEGATIVE
Urine Glucose: NEGATIVE
Urobilinogen, UA: 0.2 (ref 0.0–1.0)
pH: 6 (ref 5.0–8.0)

## 2013-02-03 NOTE — Progress Notes (Signed)
Quick Note:  Please let patient know that the lab result is normal and no further action needed ______ 

## 2013-02-05 ENCOUNTER — Other Ambulatory Visit: Payer: Self-pay | Admitting: *Deleted

## 2013-02-05 MED ORDER — COLESEVELAM HCL 3.75 G PO PACK: 1.0000 | PACK | Freq: Every day | ORAL | Status: DC

## 2013-02-08 ENCOUNTER — Other Ambulatory Visit: Payer: Self-pay | Admitting: *Deleted

## 2013-02-15 ENCOUNTER — Other Ambulatory Visit: Payer: Medicare Other

## 2013-02-15 ENCOUNTER — Ambulatory Visit (HOSPITAL_BASED_OUTPATIENT_CLINIC_OR_DEPARTMENT_OTHER): Payer: Medicare Other | Admitting: Hematology and Oncology

## 2013-02-15 ENCOUNTER — Other Ambulatory Visit (HOSPITAL_BASED_OUTPATIENT_CLINIC_OR_DEPARTMENT_OTHER): Payer: Medicare Other | Admitting: Lab

## 2013-02-15 ENCOUNTER — Other Ambulatory Visit: Payer: Medicare Other | Admitting: Lab

## 2013-02-15 ENCOUNTER — Ambulatory Visit: Payer: Medicare Other

## 2013-02-15 ENCOUNTER — Telehealth: Payer: Self-pay | Admitting: Hematology and Oncology

## 2013-02-15 ENCOUNTER — Encounter: Payer: Self-pay | Admitting: Hematology and Oncology

## 2013-02-15 VITALS — BP 133/45 | HR 62 | Temp 98.2°F | Resp 18 | Ht 60.0 in | Wt 117.2 lb

## 2013-02-15 DIAGNOSIS — D638 Anemia in other chronic diseases classified elsewhere: Secondary | ICD-10-CM

## 2013-02-15 DIAGNOSIS — D649 Anemia, unspecified: Secondary | ICD-10-CM

## 2013-02-15 LAB — CBC WITH DIFFERENTIAL/PLATELET
BASO%: 0.3 % (ref 0.0–2.0)
Basophils Absolute: 0 10*3/uL (ref 0.0–0.1)
EOS%: 9.9 % — ABNORMAL HIGH (ref 0.0–7.0)
Eosinophils Absolute: 0.7 10*3/uL — ABNORMAL HIGH (ref 0.0–0.5)
HCT: 32.3 % — ABNORMAL LOW (ref 34.8–46.6)
HGB: 10.2 g/dL — ABNORMAL LOW (ref 11.6–15.9)
LYMPH%: 17.6 % (ref 14.0–49.7)
MCH: 26.8 pg (ref 25.1–34.0)
MCHC: 31.6 g/dL (ref 31.5–36.0)
MCV: 84.9 fL (ref 79.5–101.0)
MONO#: 0.5 10*3/uL (ref 0.1–0.9)
MONO%: 6.1 % (ref 0.0–14.0)
NEUT#: 4.9 10*3/uL (ref 1.5–6.5)
NEUT%: 66.1 % (ref 38.4–76.8)
Platelets: 245 10*3/uL (ref 145–400)
RBC: 3.81 10*6/uL (ref 3.70–5.45)
RDW: 14.7 % — ABNORMAL HIGH (ref 11.2–14.5)
WBC: 7.4 10*3/uL (ref 3.9–10.3)
lymph#: 1.3 10*3/uL (ref 0.9–3.3)

## 2013-02-15 NOTE — Telephone Encounter (Signed)
Gave pt appt for lab and Md on  May 2015

## 2013-02-15 NOTE — Progress Notes (Signed)
[name]  [name],[name], MD DIAGNOSIS:  Chronic anemia  SUMMARY OF HEMATOLOGIC HISTORY: This is a patient that has been followed here 4 years for chronic anemia. In 2007, she had a bone marrow aspirate and biopsy that came back normal. The patient has been receiving Aranesp injection for chronic anemia INTERVAL HISTORY: Abigail Hoover 67 y.o. female returns for further followup. She has excellent energy level. Denies any chest pain, shortness of breath, dizziness or weight cramps on exertion. The patient denies any recent signs or symptoms of bleeding such as spontaneous epistaxis, hematuria or hematochezia.  I have reviewed the past medical history, past surgical history, social history and family history with the patient and they are unchanged from previous note.  ALLERGIES:  is allergic to ezetimibe and atorvastatin.  MEDICATIONS:  Current Outpatient Prescriptions  Medication Sig Dispense Refill  . amLODipine (NORVASC) 10 MG tablet Take 10 mg by mouth daily.        Marland Kitchen aspirin 81 MG tablet Take 81 mg by mouth daily.        Marland Kitchen CARAFATE 1 GM/10ML suspension       . Cholecalciferol (VITAMIN D3) 1000 UNITS CAPS Take 1,000 Units by mouth daily.      . Colesevelam HCl (WELCHOL) 3.75 G PACK Take 1 packet by mouth daily.  30 each  5  . dexlansoprazole (DEXILANT) 60 MG capsule Take 60 mg by mouth daily.        . ferrous sulfate 325 (65 FE) MG tablet Take 325 mg by mouth 3 (three) times daily with meals.       . furosemide (LASIX) 20 MG tablet Take 1 tablet (20 mg total) by mouth daily as needed.  30 tablet  5  . gabapentin (NEURONTIN) 300 MG capsule Take 300 mg by mouth 3 (three) times daily.       Marland Kitchen levothyroxine (SYNTHROID) 75 MCG tablet Take 88 mcg by mouth daily.       Marland Kitchen losartan (COZAAR) 100 MG tablet       . LOTEMAX 0.5 % GEL       . metoprolol (TOPROL-XL) 100 MG 24 hr tablet Take 100 mg by mouth daily.       . miglitol (GLYSET) 25 MG tablet  Take 50 mg by mouth daily. One tablet by mouth once daily      . MYRBETRIQ 25 MG TB24 Take 1 tablet by mouth Daily.      . pentosan polysulfate (ELMIRON) 100 MG capsule Take 100 mg by mouth 3 (three) times daily before meals.       . pioglitazone (ACTOS) 15 MG tablet Take 1 tablet (15 mg total) by mouth daily.  30 tablet  5  . sitaGLIPtan-metformin (JANUMET) 50-1000 MG per tablet Take 2 tablets by mouth daily.       Marland Kitchen triamcinolone cream (KENALOG) 0.1 %        No current facility-administered medications for this visit.     REVIEW OF SYSTEMS:   Constitutional: Denies fevers, chills or night sweats Behavioral/Psych: Mood is stable, no new changes  All other systems were reviewed with the patient and are negative.  PHYSICAL EXAMINATION: ECOG PERFORMANCE STATUS: 0 - Asymptomatic  Filed Vitals:   02/15/13 1209  BP: 133/45  Pulse: 62  Temp: 98.2 F (36.8 C)  Resp: 18   Filed Weights   02/15/13 1209  Weight: 117 lb 3.2 oz (53.162 kg)    GENERAL:alert, no distress and comfortable  SKIN: skin color, texture, turgor are normal, no rashes or significant lesions EYES: normal, Conjunctiva are pink and non-injected, sclera clear OROPHARYNX:no exudate, no erythema and lips, buccal mucosa, and tongue normal  NECK: supple, thyroid normal size, non-tender, without nodularity LYMPH:  no palpable lymphadenopathy in the cervical, axillary or inguinal LUNGS: clear to auscultation and percussion with normal breathing effort HEART: regular rate & rhythm and no murmurs and no lower extremity edema ABDOMEN:abdomen soft, non-tender and normal bowel sounds Musculoskeletal:no cyanosis of digits and no clubbing  NEURO: alert & oriented x 3 with fluent speech, no focal motor/sensory deficits  LABORATORY DATA:  I have reviewed the data as listed Results for orders placed in visit on 02/15/13 (from the past 48 hour(s))  CBC WITH DIFFERENTIAL     Status: Abnormal   Collection Time    02/15/13 11:58 AM       Result Value Range   WBC 7.4  3.9 - 10.3 10e3/uL   NEUT# 4.9  1.5 - 6.5 10e3/uL   HGB 10.2 (*) 11.6 - 15.9 g/dL   HCT 32.3 (*) 34.8 - 46.6 %   Platelets 245  145 - 400 10e3/uL   MCV 84.9  79.5 - 101.0 fL   MCH 26.8  25.1 - 34.0 pg   MCHC 31.6  31.5 - 36.0 g/dL   RBC 3.81  3.70 - 5.45 10e6/uL   RDW 14.7 (*) 11.2 - 14.5 %   lymph# 1.3  0.9 - 3.3 10e3/uL   MONO# 0.5  0.1 - 0.9 10e3/uL   Eosinophils Absolute 0.7 (*) 0.0 - 0.5 10e3/uL   Basophils Absolute 0.0  0.0 - 0.1 10e3/uL   NEUT% 66.1  38.4 - 76.8 %   LYMPH% 17.6  14.0 - 49.7 %   MONO% 6.1  0.0 - 14.0 %   EOS% 9.9 (*) 0.0 - 7.0 %   BASO% 0.3  0.0 - 2.0 %    Lab Results  Component Value Date   WBC 7.4 02/15/2013   HGB 10.2* 02/15/2013   HCT 32.3* 02/15/2013   MCV 84.9 02/15/2013   PLT 245 02/15/2013   ASSESSMENT & PLAN:  #1 chronic anemia Causes unknown. I do not think this is related to anemia chronic disease. With her [place], I want to make sure I exclude a diagnosis of thalassemia. I will order hemoglobinopathy evaluation with the next visit. Her last ferritin level was adequate. I have asked her to stop taking iron supplements which caused her a lot of constipation. She agrees. All questions were answered. The patient knows to call the clinic with any problems, questions or concerns. No barriers to learning was detected.  I spent 15 minutes counseling the patient face to face. The total time spent in the appointment was 20 minutes and more than 50% was on counseling.     [facility], [place], MD 02/15/2013 12:29 PM

## 2013-02-17 DIAGNOSIS — R300 Dysuria: Secondary | ICD-10-CM | POA: Diagnosis not present

## 2013-02-17 DIAGNOSIS — R350 Frequency of micturition: Secondary | ICD-10-CM | POA: Diagnosis not present

## 2013-02-17 DIAGNOSIS — N949 Unspecified condition associated with female genital organs and menstrual cycle: Secondary | ICD-10-CM | POA: Diagnosis not present

## 2013-02-17 DIAGNOSIS — N3010 Interstitial cystitis (chronic) without hematuria: Secondary | ICD-10-CM | POA: Diagnosis not present

## 2013-02-17 DIAGNOSIS — R3915 Urgency of urination: Secondary | ICD-10-CM | POA: Diagnosis not present

## 2013-02-17 DIAGNOSIS — R109 Unspecified abdominal pain: Secondary | ICD-10-CM | POA: Diagnosis not present

## 2013-02-24 ENCOUNTER — Other Ambulatory Visit: Payer: Self-pay | Admitting: *Deleted

## 2013-02-24 MED ORDER — MIGLITOL 50 MG PO TABS: ORAL_TABLET | ORAL | Status: DC

## 2013-02-24 MED ORDER — SITAGLIPTIN PHOS-METFORMIN HCL 50-1000 MG PO TABS: ORAL_TABLET | ORAL | Status: DC

## 2013-02-25 ENCOUNTER — Ambulatory Visit (INDEPENDENT_AMBULATORY_CARE_PROVIDER_SITE_OTHER): Payer: Medicare Other | Admitting: General Surgery

## 2013-03-04 DIAGNOSIS — N3010 Interstitial cystitis (chronic) without hematuria: Secondary | ICD-10-CM | POA: Diagnosis not present

## 2013-03-08 ENCOUNTER — Other Ambulatory Visit: Payer: Self-pay | Admitting: *Deleted

## 2013-03-08 ENCOUNTER — Other Ambulatory Visit (INDEPENDENT_AMBULATORY_CARE_PROVIDER_SITE_OTHER): Payer: Medicare Other

## 2013-03-08 DIAGNOSIS — IMO0001 Reserved for inherently not codable concepts without codable children: Secondary | ICD-10-CM | POA: Diagnosis not present

## 2013-03-08 DIAGNOSIS — E039 Hypothyroidism, unspecified: Secondary | ICD-10-CM | POA: Diagnosis not present

## 2013-03-08 LAB — TSH: TSH: 3.42 u[IU]/mL (ref 0.35–5.50)

## 2013-03-08 LAB — COMPREHENSIVE METABOLIC PANEL
ALT: 16 U/L (ref 0–35)
AST: 19 U/L (ref 0–37)
Albumin: 4.3 g/dL (ref 3.5–5.2)
Alkaline Phosphatase: 76 U/L (ref 39–117)
BUN: 20 mg/dL (ref 6–23)
CO2: 28 mEq/L (ref 19–32)
Calcium: 9.8 mg/dL (ref 8.4–10.5)
Chloride: 97 mEq/L (ref 96–112)
Creatinine, Ser: 1.1 mg/dL (ref 0.4–1.2)
GFR: 50.61 mL/min — ABNORMAL LOW (ref >60.00)
Glucose, Bld: 127 mg/dL — ABNORMAL HIGH (ref 70–99)
Potassium: 4.4 mEq/L (ref 3.5–5.1)
Sodium: 132 mEq/L — ABNORMAL LOW (ref 135–145)
Total Bilirubin: 0.7 mg/dL (ref 0.3–1.2)
Total Protein: 6.8 g/dL (ref 6.0–8.3)

## 2013-03-08 LAB — LIPID PANEL
Cholesterol: 158 mg/dL (ref 0–200)
HDL: 82.2 mg/dL (ref >39.00)
LDL Cholesterol: 64 mg/dL (ref 0–99)
Total CHOL/HDL Ratio: 2
Triglycerides: 61 mg/dL (ref 0.0–149.0)
VLDL: 12.2 mg/dL (ref 0.0–40.0)

## 2013-03-08 LAB — HEMOGLOBIN A1C: Hgb A1c MFr Bld: 7.5 % — ABNORMAL HIGH (ref 4.6–6.5)

## 2013-03-08 LAB — T4, FREE: Free T4: 1.38 ng/dL (ref 0.60–1.60)

## 2013-03-08 MED ORDER — ONETOUCH ULTRA 2 W/DEVICE KIT: PACK | Status: DC

## 2013-03-09 ENCOUNTER — Other Ambulatory Visit: Payer: Self-pay | Admitting: *Deleted

## 2013-03-09 ENCOUNTER — Ambulatory Visit (INDEPENDENT_AMBULATORY_CARE_PROVIDER_SITE_OTHER): Payer: Medicare Other | Admitting: General Surgery

## 2013-03-09 MED ORDER — LEVOTHYROXINE SODIUM 88 MCG PO TABS: 88.0000 ug | ORAL_TABLET | Freq: Every day | ORAL | Status: DC

## 2013-03-10 DIAGNOSIS — R1013 Epigastric pain: Secondary | ICD-10-CM | POA: Diagnosis not present

## 2013-03-10 DIAGNOSIS — K219 Gastro-esophageal reflux disease without esophagitis: Secondary | ICD-10-CM | POA: Diagnosis not present

## 2013-03-11 ENCOUNTER — Other Ambulatory Visit: Payer: Self-pay | Admitting: *Deleted

## 2013-03-12 ENCOUNTER — Ambulatory Visit: Payer: Medicare Other | Admitting: Endocrinology

## 2013-03-12 ENCOUNTER — Encounter: Payer: Self-pay | Admitting: *Deleted

## 2013-03-22 ENCOUNTER — Other Ambulatory Visit: Payer: Self-pay | Admitting: *Deleted

## 2013-03-23 ENCOUNTER — Ambulatory Visit: Payer: Medicare Other | Admitting: Endocrinology

## 2013-03-23 DIAGNOSIS — M79609 Pain in unspecified limb: Secondary | ICD-10-CM | POA: Diagnosis not present

## 2013-03-23 DIAGNOSIS — E7800 Pure hypercholesterolemia, unspecified: Secondary | ICD-10-CM | POA: Diagnosis not present

## 2013-03-23 DIAGNOSIS — E119 Type 2 diabetes mellitus without complications: Secondary | ICD-10-CM | POA: Diagnosis not present

## 2013-03-23 DIAGNOSIS — R079 Chest pain, unspecified: Secondary | ICD-10-CM | POA: Diagnosis not present

## 2013-03-24 ENCOUNTER — Encounter: Payer: Self-pay | Admitting: Endocrinology

## 2013-03-24 ENCOUNTER — Ambulatory Visit (INDEPENDENT_AMBULATORY_CARE_PROVIDER_SITE_OTHER): Payer: Medicare Other | Admitting: Endocrinology

## 2013-03-24 VITALS — BP 130/72 | HR 60 | Temp 98.3°F | Resp 10 | Ht 60.0 in | Wt 124.8 lb

## 2013-03-24 DIAGNOSIS — E7800 Pure hypercholesterolemia, unspecified: Secondary | ICD-10-CM

## 2013-03-24 DIAGNOSIS — E1142 Type 2 diabetes mellitus with diabetic polyneuropathy: Secondary | ICD-10-CM

## 2013-03-24 DIAGNOSIS — Z23 Encounter for immunization: Secondary | ICD-10-CM

## 2013-03-24 DIAGNOSIS — IMO0001 Reserved for inherently not codable concepts without codable children: Secondary | ICD-10-CM

## 2013-03-24 DIAGNOSIS — E039 Hypothyroidism, unspecified: Secondary | ICD-10-CM

## 2013-03-24 DIAGNOSIS — R609 Edema, unspecified: Secondary | ICD-10-CM | POA: Diagnosis not present

## 2013-03-24 DIAGNOSIS — E1140 Type 2 diabetes mellitus with diabetic neuropathy, unspecified: Secondary | ICD-10-CM

## 2013-03-24 DIAGNOSIS — E1149 Type 2 diabetes mellitus with other diabetic neurological complication: Secondary | ICD-10-CM

## 2013-03-24 NOTE — Patient Instructions (Addendum)
Please check blood sugars at least half the time about 2 hours after any meal and weekly on waking up. More sugars after lunch Please bring blood sugar monitor to each visit  Stop Actos

## 2013-03-24 NOTE — Progress Notes (Signed)
Patient ID: Abigail Hoover, female   DOB: 10/25/1945, 67 y.o.   MRN: [id number]    Reason for Appointment: Diabetes follow-up   History of Present Illness   Diagnosis: Type 2 DIABETES MELITUS, long-standing  She has had mild diabetes for several years but has required multiple drugs for control Usually has had postprandial hyperglycemia, this has been better controlled with adding Glyset to her main meals Has never been on insulin or a GLP-1 drug  Recent history: She was having low blood sugars on her last visit and was asked to take only 1000 mg metformin a day. She thinks her blood sugars were higher when she tried this and is back on 2 tablets of Janumet today Surprisingly is not getting any more low blood sugars, not clear if she is eating more at night since she is gaining weight Most of her blood sugars are in the morning and some after supper, none after breakfast or lunch Blood sugar after breakfast in the lab was 127   Oral hypoglycemic drugs: Actos 15 mg, Glyset, Janumet 50/1000, WelChol       Side effects from medications: None Proper timing of medications in relation to meals: Yes.          Monitors blood glucose:  1 time a day.    Glucometer: One Touch       Blood Glucose readings from meter download: FASTING 73-117, median 91. PC supper 82-172, median 133 Hypoglycemia: None           Meals: 2- 3 meals per day.  eating small breakfast but mostly with carbohydrates    Physical activity: exercise: None recently, not able to do treadmill because of exertional chest pain           Dietician visit: Most recent: Unknown       Complications: are mild symptoms of neuropathy, no nephropathy or retinopathy   Wt Readings from Last 3 Encounters:  03/24/13 124 lb 12.8 oz (56.609 kg)  02/15/13 117 lb 3.2 oz (53.162 kg)  01/28/13 116 lb 3.2 oz (52.708 kg)   Lab Results  Component Value Date   HGBA1C 7.5* 03/08/2013   HGBA1C 6.3 11/05/2012   HGBA1C 5.3 08/01/2009   Lab Results   Component Value Date   MICROALBUR 1.7 11/05/2012   LDLCALC 64 03/08/2013   CREATININE 1.1 03/08/2013     No visits with results within 1 Week(s) from this visit. Latest known visit with results is:  Appointment on 03/08/2013  Component Date Value Range Status  . TSH 03/08/2013 3.42  0.35 - 5.50 uIU/mL Final  . Free T4 03/08/2013 1.38  0.60 - 1.60 ng/dL Final  . Hemoglobin A1C 03/08/2013 7.5* 4.6 - 6.5 % Final   Glycemic Control Guidelines for People with Diabetes:Non Diabetic:  <6%Goal of Therapy: <7%Additional Action Suggested:  >8%   . Sodium 03/08/2013 132* 135 - 145 mEq/L Final  . Potassium 03/08/2013 4.4  3.5 - 5.1 mEq/L Final  . Chloride 03/08/2013 97  96 - 112 mEq/L Final  . CO2 03/08/2013 28  19 - 32 mEq/L Final  . Glucose, Bld 03/08/2013 127* 70 - 99 mg/dL Final  . BUN 03/08/2013 20  6 - 23 mg/dL Final  . Creatinine, Ser 03/08/2013 1.1  0.4 - 1.2 mg/dL Final  . Total Bilirubin 03/08/2013 0.7  0.3 - 1.2 mg/dL Final  . Alkaline Phosphatase 03/08/2013 76  39 - 117 U/L Final  . AST 03/08/2013 19  0 - 37 U/L Final  .  ALT 03/08/2013 16  0 - 35 U/L Final  . Total Protein 03/08/2013 6.8  6.0 - 8.3 g/dL Final  . Albumin 03/08/2013 4.3  3.5 - 5.2 g/dL Final  . Calcium 03/08/2013 9.8  8.4 - 10.5 mg/dL Final  . GFR 03/08/2013 50.61* >60.00 mL/min Final  . Cholesterol 03/08/2013 158  0 - 200 mg/dL Final   ATP III Classification       Desirable:  < 200 mg/dL               Borderline High:  200 - 239 mg/dL          High:  > = 240 mg/dL  . Triglycerides 03/08/2013 61.0  0.0 - 149.0 mg/dL Final   Normal:  <150 mg/dLBorderline High:  150 - 199 mg/dL  . HDL 03/08/2013 82.20  >39.00 mg/dL Final  . VLDL 03/08/2013 12.2  0.0 - 40.0 mg/dL Final  . LDL Cholesterol 03/08/2013 64  0 - 99 mg/dL Final  . Total CHOL/HDL Ratio 03/08/2013 2   Final                  Men          Women1/2 Average Risk     3.4          3.3Average Risk          5.0          4.42X Average Risk          9.6           7.13X Average Risk          15.0          11.0                          Medication List       This list is accurate as of: 03/24/13  1:17 PM.  Always use your most recent med list.               amLODipine 10 MG tablet  Commonly known as:  NORVASC  Take 10 mg by mouth daily.     aspirin 81 MG tablet  Take 81 mg by mouth daily.     CARAFATE 1 GM/10ML suspension  Generic drug:  sucralfate     Colesevelam HCl 3.75 G Pack  Commonly known as:  WELCHOL  Take 1 packet by mouth daily.     DEXILANT 60 MG capsule  Generic drug:  dexlansoprazole  Take 60 mg by mouth daily.     ferrous sulfate 325 (65 FE) MG tablet  Take 325 mg by mouth 3 (three) times daily with meals.     furosemide 20 MG tablet  Commonly known as:  LASIX  Take 1 tablet (20 mg total) by mouth daily as needed.     gabapentin 300 MG capsule  Commonly known as:  NEURONTIN  Take 300 mg by mouth 3 (three) times daily.     levothyroxine 88 MCG tablet  Commonly known as:  SYNTHROID  Take 1 tablet (88 mcg total) by mouth daily.     losartan 100 MG tablet  Commonly known as:  COZAAR     LOTEMAX 0.5 % Gel  Generic drug:  Loteprednol Etabonate     metoprolol succinate 100 MG 24 hr tablet  Commonly known as:  TOPROL-XL  Take 100 mg by mouth daily.     miglitol 50 MG tablet  Commonly known as:  GLYSET  One tablet by mouth twice a day     MYRBETRIQ 25 MG Tb24 tablet  Generic drug:  mirabegron ER  Take 1 tablet by mouth Daily.     ONE TOUCH ULTRA 2 W/DEVICE Kit  Use to check blood sugars 1 time per day     pentosan polysulfate 100 MG capsule  Commonly known as:  ELMIRON  Take 100 mg by mouth 3 (three) times daily before meals.     pioglitazone 15 MG tablet  Commonly known as:  ACTOS  Take 1 tablet (15 mg total) by mouth daily.     PROCTOSOL HC 2.5 % rectal cream  Generic drug:  hydrocortisone     sitaGLIPtin-metformin 50-1000 MG per tablet  Commonly known as:  JANUMET  Take 1 tablet by mouth  twice a day     triamcinolone cream 0.1 %  Commonly known as:  KENALOG     Vitamin D3 1000 UNITS Caps  Take 1,000 Units by mouth daily.     ZOSTAVAX 91478 UNT/0.65ML injection  Generic drug:  zoster vaccine live (PF)        Allergies:  Allergies  Allergen Reactions  . Ezetimibe Itching  . Atorvastatin     REACTION: rash    Past Medical History  Diagnosis Date  . Diabetes mellitus   . Hypertension   . Thyroid disease   . Arthritis   . Anemia   . GERD (gastroesophageal reflux disease)   . Hypercholesterolemia   . Bladder irritation   . Iron deficiency anemia, unspecified   . Hypothyroidism   . Anxiety   . Psoriasis     Past Surgical History  Procedure Laterality Date  . Abdominal hysterectomy      TAH  . Cervical disc surgery    . Cataract extraction    . Breast biopsy      Left breast biopsy benign lesion  . Colonoscopy  2013    neg. next 2023.   Marland Kitchen Eye surgery    . Breast biopsy  03/11/2012    Procedure: BREAST BIOPSY;  Surgeon: Odis Hollingshead, MD;  Location: [facility];  Service: General;  Laterality: Left;  remove left breast mass    Family History  Problem Relation Age of Onset  . Breast cancer Sister   . Cancer Sister     breast cancer  . Heart disease Mother     heart attack  . Stroke Father     brain hemorrhage  . Diabetes Brother   . Cancer Brother     Social History:  reports that she has never smoked. She has never used smokeless tobacco. She reports that she does not drink alcohol or use illicit drugs.  Review of Systems:  HYPERTENSION:  this is well-controlled with half tablet of 10 mg amlodipine along with losartan metoprolol, also follows up with cardiologist  HYPERLIPIDEMIA: The lipid abnormality consists of elevated LDL which is mild and well controlled with WelChol.  She has a long history of hypothyroidism which has been fairly stable and TSH consistently normal with 75 mcg recently. She takes this on the empty stomach early in  the morning and will take her WelChol about 4-5 hours later  She does have occasional burning in her feet but not interfering with sleep  She has had a previous history of hypercalcemia of unclear etiology. Calcium is now consistently normal  History of iron deficiency anemia; currently not taking iron and is getting her  Aranesp only about every 6 months     Examination:   BP 130/72  Pulse 60  Temp(Src) 98.3 F (36.8 C)  Resp 10  Ht 5' (1.524 m)  Wt 124 lb 12.8 oz (56.609 kg)  BMI 24.37 kg/m2  SpO2 96%  Body mass index is 24.37 kg/(m^2).   2+ pedal edema present Diabetic foot exam: See separate section  ASSESSMENT/ PLAN::   Diabetes type 2   The patient's diabetes control appears same as judged by her blood sugars but A1c is surprisingly high. This may be possibly from getting relatively low readings previously  She is currently on a 5 drug regimen with Januvia, metformin, Actos, WelChol and Glyset Also not checking enough readings after meals especially breakfast and lunch  Discussed monitoring blood sugars after different meals especially lunch Needs to be consistent with carbohydrate intake and getting some protein with each meal Will stop Actos because of continued and worsening edema, may be getting fluid overloaded as judged by her recent weight gain. Not clear if she is benefiting from this with her glycemic control  She can continue a higher dose of Janumet for now  Edema: Has not had any other symptoms suggestive of CHF She has dependent pedal edema which appears worse despite taking Lasix. Have discussed stopping Actos for this reason. Her amlodipine doses only 5 mg, half of 10 mg May also consider elastic stockings if not better.  Hyperlipidemia: Excellent control and HDL is relatively high  Hypothyroidism: Adequately replaced. She is compliant with her medication and is keeping it separate from her WelChol  Preventive care: Records were reviewed and she  apparently has not had Pneumovax. She does not think she has had this elsewhere. We'll do this today  To followup in 3 months  Sanora Cunanan 03/24/2013, 1:17 PM

## 2013-03-29 ENCOUNTER — Other Ambulatory Visit: Payer: Medicare Other | Admitting: Lab

## 2013-03-29 ENCOUNTER — Ambulatory Visit: Payer: Medicare Other

## 2013-03-29 ENCOUNTER — Other Ambulatory Visit: Payer: Medicare Other

## 2013-04-02 DIAGNOSIS — J069 Acute upper respiratory infection, unspecified: Secondary | ICD-10-CM | POA: Diagnosis not present

## 2013-04-08 ENCOUNTER — Other Ambulatory Visit: Payer: Medicare Other | Admitting: Lab

## 2013-04-08 ENCOUNTER — Ambulatory Visit: Payer: Medicare Other | Admitting: Hematology and Oncology

## 2013-04-19 ENCOUNTER — Ambulatory Visit (INDEPENDENT_AMBULATORY_CARE_PROVIDER_SITE_OTHER): Payer: Medicare Other | Admitting: General Surgery

## 2013-04-19 ENCOUNTER — Encounter (INDEPENDENT_AMBULATORY_CARE_PROVIDER_SITE_OTHER): Payer: Self-pay | Admitting: General Surgery

## 2013-04-19 VITALS — BP 110/60 | HR 72 | Resp 20 | Ht 61.0 in | Wt 118.2 lb

## 2013-04-19 DIAGNOSIS — N644 Mastodynia: Secondary | ICD-10-CM | POA: Diagnosis not present

## 2013-04-19 NOTE — Progress Notes (Addendum)
Subjective:     Patient ID: Abigail Hoover, female   DOB: December 01, 1945, 68 y.o.   MRN: [id number]  HPI  Abigail Hoover is at high-risk for breast cancer and presents for her semi-annual examination.  She denies any changes in her breasts. She still has some intermittent left breast pain. She is not going to [place] this year she states.  Last mammogram from August of 2014 was a BI-RADS 1   Review of Systems     Objective:   Physical Exam Gen.-she looks well and is in no acute distress.  -Breasts-no dominant masses or suspicious skin changes are present.  Lymph nodes-no enlarged supraclavicular or axillary adenopathy.    Assessment:     High risk for breast cancer with no clinical or radiographic evidence of malignancy.     Plan:     Return visit 6 months.

## 2013-04-19 NOTE — Patient Instructions (Signed)
We will see you in 6 months.  Please call if you find anything new in your breasts.

## 2013-04-21 ENCOUNTER — Encounter: Payer: Self-pay | Admitting: Neurology

## 2013-04-21 ENCOUNTER — Ambulatory Visit (INDEPENDENT_AMBULATORY_CARE_PROVIDER_SITE_OTHER): Payer: Medicare Other | Admitting: Neurology

## 2013-04-21 VITALS — BP 126/62 | HR 62 | Ht 61.0 in | Wt 116.0 lb

## 2013-04-21 DIAGNOSIS — E1149 Type 2 diabetes mellitus with other diabetic neurological complication: Secondary | ICD-10-CM

## 2013-04-21 DIAGNOSIS — R209 Unspecified disturbances of skin sensation: Secondary | ICD-10-CM | POA: Diagnosis not present

## 2013-04-21 DIAGNOSIS — N184 Chronic kidney disease, stage 4 (severe): Secondary | ICD-10-CM | POA: Insufficient documentation

## 2013-04-21 DIAGNOSIS — Z794 Long term (current) use of insulin: Secondary | ICD-10-CM | POA: Insufficient documentation

## 2013-04-21 DIAGNOSIS — E1122 Type 2 diabetes mellitus with diabetic chronic kidney disease: Secondary | ICD-10-CM | POA: Insufficient documentation

## 2013-04-21 MED ORDER — GABAPENTIN 400 MG PO CAPS: 400.0000 mg | ORAL_CAPSULE | Freq: Three times a day (TID) | ORAL | Status: DC

## 2013-04-21 NOTE — Patient Instructions (Signed)
I had a long discussion with the patient and husband regarding her paresthesias, results of my evaluation, plan for testing and answered questions. I recommend increasing gabapentin to 400 mg 3 times daily for paresthesias. Check neuropathy panel labs an EMG nerve conduction study. I advised her to maintain tight control of diabetes with fasting sugars below 120 and hemoglobin A1c below 6.5%. Return for followup in 6 weeks or call earlier if necessary

## 2013-04-21 NOTE — Progress Notes (Signed)
[facility] [address] [place]. [place] [address] [phone]       OFFICE CONSULT NOTE  [name] Date of Birth:  April 17, 1945 Medical Record Number:  [id number]   Referring MD:  Mickel Duhamel  Reason for Referral:  Numbness hans and feet  HPI: 2 year [place] lady three-month history of tingling, numbness and burning in her hands and feet. This involves mainly the bottom of her feet and fingertips and hand it is present off and on throughout the day. At times her fingers heart cramped. She has some trouble with fine motor skills by cutting vegetables and occasionally drops objects. She denies any significant problem with balance or walking. She denies significant weakness in the legs. She has tried Lyrica in the past which did not help and stopped it. She is currently on gabapentin 300 mg three times daily which actually she has been taking for 5 years. She has history of diabetes but states his sugars are well controlled and lasti HbA1c 6.7 1 month ago. She also complains of mild short-term memory difficulties which his hand for last several years. 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  She has trouble remembering recent information   but at times but she can remember later.. she is still quite active and independent in activities of daily living and does not need any help. She has history of degenerative cervical spine disease and underwent surgery by Dr. Sherwood Gambler. She did have some paresthesias in her hand prior to the surgery which have been persistent. She denies significant neck pain, radicular pain or trouble with gait or balance .  ROS:   14 system review of systems is positive for , numbness, burning, and  Tingling,memory loss and  all the systems negative  PMH:  Past Medical History  Diagnosis Date  . Diabetes mellitus   . Hypertension   . Thyroid disease   . Arthritis   . Anemia   . GERD (gastroesophageal reflux disease)   . Hypercholesterolemia   .  Bladder irritation   . Iron deficiency anemia, unspecified   . Hypothyroidism   . Anxiety   . Psoriasis     Social History:  History   Social History  . Marital Status: Married    Spouse Name: N/A    Number of Children: 1  . Years of Education: college   Occupational History  . Homemaker    Social History Main Topics  . Smoking status: Never Smoker   . Smokeless tobacco: Never Used  . Alcohol Use: No  . Drug Use: No  . Sexual Activity: Not on file   Other Topics Concern  . Not on file   Social History Narrative  . No narrative on file    Medications:   Current Outpatient Prescriptions on File Prior to Visit  Medication Sig Dispense Refill  . amLODipine (NORVASC) 10 MG tablet Take 10 mg by mouth daily.        Marland Kitchen aspirin 81 MG tablet Take 81 mg by mouth daily.        . Blood Glucose Monitoring Suppl (ONE TOUCH ULTRA 2) W/DEVICE KIT Use to check blood sugars 1 time per day  1 each  0  . CARAFATE 1 GM/10ML suspension       . Cholecalciferol (VITAMIN D3) 1000 UNITS CAPS Take 1,000 Units by mouth daily.      . Colesevelam HCl (WELCHOL) 3.75 G PACK Take 1 packet by mouth daily.  30 each  5  .  dexlansoprazole (DEXILANT) 60 MG capsule Take 60 mg by mouth daily.        . ferrous sulfate 325 (65 FE) MG tablet Take 325 mg by mouth 3 (three) times daily with meals.       . furosemide (LASIX) 20 MG tablet Take 1 tablet (20 mg total) by mouth daily as needed.  30 tablet  5  . levothyroxine (SYNTHROID) 88 MCG tablet Take 1 tablet (88 mcg total) by mouth daily.  90 tablet  3  . losartan (COZAAR) 100 MG tablet       . metoprolol (TOPROL-XL) 100 MG 24 hr tablet Take 100 mg by mouth daily.       . miglitol (GLYSET) 50 MG tablet One tablet by mouth twice a day  180 tablet  3  . MYRBETRIQ 25 MG TB24 Take 1 tablet by mouth Daily.      . pentosan polysulfate (ELMIRON) 100 MG capsule Take 100 mg by mouth 3 (three) times daily before meals.       Marland Kitchen PROCTOSOL HC 2.5 % rectal cream       .  sitaGLIPtin-metformin (JANUMET) 50-1000 MG per tablet Take 1 tablet by mouth twice a day  180 tablet  3  . triamcinolone cream (KENALOG) 0.1 %       . ZOSTAVAX 08144 UNT/0.65ML injection        No current facility-administered medications on file prior to visit.    Allergies:   Allergies  Allergen Reactions  . Ezetimibe Itching  . Atorvastatin     REACTION: rash    Physical Exam General: frail elderly lady, seated, in no evident distress Head: head normocephalic and atraumatic. Orohparynx benign Neck: supple with no carotid or supraclavicular bruits Cardiovascular: regular rate and rhythm, no murmurs Musculoskeletal: no deformity Skin:  no rash/petichiae Vascular:  Normal pulses all extremities Filed Vitals:   04/21/13 1300  BP: 126/62  Pulse: 62    Neurologic Exam Mental Status: Awake and fully alert. Oriented to place and time. Recent and remote memory intact. Attention span, concentration and fund of knowledge appropriate. Mood and affect appropriate.  Cranial Nerves: Fundoscopic exam reveals sharp disc margins. Pupils equal, briskly reactive to light. Extraocular movements full without nystagmus. Visual fields full to confrontation. Hearing intact. Facial sensation intact. Face, tongue, palate moves normally and symmetrically.  Motor: Normal bulk and tone. Normal strength in all tested extremity muscles. Sensory.: intact to touch and pinprick sensation but impaired position and  vibratory. Sensation from ankle down bilaterally. Coordination: Rapid alternating movements normal in all extremities. Finger-to-nose and heel-to-shin performed accurately bilaterally. Gait and Station: Arises from chair without difficulty. Stance is normal. Gait demonstrates normal stride length and balance . Able to heel, toe and tandem walk with mild difficulty.  Reflexes: 1+ and symmetric except ankle jerks absent and right supinator and triceps jerks brisk 2 + bilaterally. Toes downgoing.        ASSESSMENT: 18 year Olds lady with 3 month history of paresthesias in hands and feet likely due to peripheral neuropathy. Etiology and most likely diabetes though other treatable etiologies need to be ruled out.    PLAN: I had a long discussion with the patient and husband regarding her paresthesias, results of my evaluation, plan for testing and answered questions. I recommend increasing gabapentin to 400 mg 3 times daily for paresthesias. Check neuropathy panel labs an EMG nerve conduction study. I advised her to maintain tight control of diabetes with fasting sugars below 120  and hemoglobin A1c below 6.5%. Return for followup in 6 weeks or call earlier if necessary    Note: This document was prepared with digital dictation and possible smart phrase technology. Any transcriptional errors that result from this process are unintentional.

## 2013-04-23 DIAGNOSIS — L259 Unspecified contact dermatitis, unspecified cause: Secondary | ICD-10-CM | POA: Diagnosis not present

## 2013-04-26 ENCOUNTER — Other Ambulatory Visit (INDEPENDENT_AMBULATORY_CARE_PROVIDER_SITE_OTHER): Payer: Self-pay

## 2013-04-26 DIAGNOSIS — E1142 Type 2 diabetes mellitus with diabetic polyneuropathy: Secondary | ICD-10-CM | POA: Diagnosis not present

## 2013-04-26 DIAGNOSIS — IMO0001 Reserved for inherently not codable concepts without codable children: Secondary | ICD-10-CM | POA: Diagnosis not present

## 2013-04-26 DIAGNOSIS — Z0289 Encounter for other administrative examinations: Secondary | ICD-10-CM

## 2013-04-26 DIAGNOSIS — E1149 Type 2 diabetes mellitus with other diabetic neurological complication: Secondary | ICD-10-CM | POA: Diagnosis not present

## 2013-04-26 DIAGNOSIS — G609 Hereditary and idiopathic neuropathy, unspecified: Secondary | ICD-10-CM | POA: Diagnosis not present

## 2013-04-26 DIAGNOSIS — R269 Unspecified abnormalities of gait and mobility: Secondary | ICD-10-CM | POA: Diagnosis not present

## 2013-04-26 DIAGNOSIS — R209 Unspecified disturbances of skin sensation: Secondary | ICD-10-CM | POA: Diagnosis not present

## 2013-04-28 ENCOUNTER — Encounter (INDEPENDENT_AMBULATORY_CARE_PROVIDER_SITE_OTHER): Payer: Self-pay | Admitting: Radiology

## 2013-04-28 ENCOUNTER — Ambulatory Visit (INDEPENDENT_AMBULATORY_CARE_PROVIDER_SITE_OTHER): Payer: Medicare Other | Admitting: Neurology

## 2013-04-28 DIAGNOSIS — R209 Unspecified disturbances of skin sensation: Secondary | ICD-10-CM

## 2013-04-28 DIAGNOSIS — E1142 Type 2 diabetes mellitus with diabetic polyneuropathy: Secondary | ICD-10-CM | POA: Diagnosis not present

## 2013-04-28 DIAGNOSIS — G5600 Carpal tunnel syndrome, unspecified upper limb: Secondary | ICD-10-CM

## 2013-04-28 DIAGNOSIS — E1149 Type 2 diabetes mellitus with other diabetic neurological complication: Secondary | ICD-10-CM

## 2013-04-28 LAB — NEUROPATHY PANEL
A/G Ratio: 2 (ref 0.7–2.0)
Albumin ELP: 4 g/dL (ref 3.2–5.6)
Alpha 1: 0.2 g/dL (ref 0.1–0.4)
Alpha 2: 0.6 g/dL (ref 0.4–1.2)
Angio Convert Enzyme: 32 U/L (ref 14–82)
Anti Nuclear Antibody(ANA): NEGATIVE
Beta: 0.7 g/dL (ref 0.6–1.3)
Gamma Globulin: 0.5 g/dL (ref 0.5–1.6)
Globulin, Total: 2 g/dL (ref 2.0–4.5)
Rhuematoid fact SerPl-aCnc: 8.4 IU/mL (ref 0.0–13.9)
Sed Rate: 12 mm/hr (ref 0–40)
TSH: 2.22 u[IU]/mL (ref 0.450–4.500)
Total Protein: 6 g/dL (ref 6.0–8.5)
Vit D, 25-Hydroxy: 35.2 ng/mL (ref 30.0–100.0)
Vitamin B-12: >1999 pg/mL — ABNORMAL HIGH (ref 211–946)

## 2013-04-28 NOTE — Procedures (Signed)
     HISTORY:  Abigail Hoover is a 68 year old patient with a history of diabetes who reports a several month history of burning sensations in the hands and feet. The patient has a history of prior cervical spine surgery. The patient denies any balance issues. The patient is being evaluated for a possible peripheral neuropathy.  NERVE CONDUCTION STUDIES:  Nerve conduction studies were performed on both upper extremities. The distal motor latencies for the median nerves were prolonged bilaterally, with normal motor amplitudes for these nerves bilaterally. The distal motor latency and motor amplitudes for the left ulnar nerve were normal. The F wave latencies and nerve conduction velocities for the left median and ulnar nerves were normal. The F wave latency for the right median nerve was normal, with a slightly slow nerve conduction velocity. The sensory latencies for the median nerves bilaterally and for the left ulnar nerve were prolonged.  Nerve conduction studies were performed on both lower extremities. The distal motor latencies for the peroneal nerves were normal bilaterally, with low motor amplitudes for these nerves bilaterally. The study of the right posterior tibial nerve was unobtainable. The left posterior tibial nerve revealed a normal distal motor latency and motor amplitude. The nerve conduction velocities for the peroneal nerves bilaterally and for the left posterior tibial nerve were normal. The F wave latencies for the peroneal nerves bilaterally and for the right posterior tibial nerves were unobtainable. The F wave latency for the left posterior tibial nerve was normal. The sensory latencies for the peroneal nerves were unobtainable on the right, and normal on the left.  EMG STUDIES:  EMG study was performed on the right lower extremity:  The tibialis anterior muscle reveals 2 to 4K motor units with full recruitment. No fibrillations or positive waves were seen. The peroneus  tertius muscle reveals 2 to 5K motor units with slightly decreased recruitment. One plus fibrillations and positive waves were seen. The medial gastrocnemius muscle reveals 1 to 3K motor units with full recruitment. No fibrillations or positive waves were seen. The vastus lateralis muscle reveals 2 to 4K motor units with full recruitment. No fibrillations or positive waves were seen. The iliopsoas muscle reveals 2 to 4K motor units with full recruitment. No fibrillations or positive waves were seen. The biceps femoris muscle (long head) reveals 2 to 4K motor units with full recruitment. No fibrillations or positive waves were seen. The lumbosacral paraspinal muscles were tested at 3 levels, and revealed no abnormalities of insertional activity at all 3 levels tested. There was poor relaxation.   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 Nerve conduction studies also reveals evidence of a primarily axonal peripheral neuropathy of moderate severity. EMG evaluation of the right lower extremity shows minimal chronic and acute denervation consistent with the peripheral neuropathy. The EMG study would suggest that the peripheral neuropathy is less severe than the nerve conduction studies would suggest. The patient does have some peripheral edema which may may affect the nerve conduction study results.  Jill Alexanders MD 04/28/2013 1:42 PM  [facility] [address] [place], [place] [address]  Phone [phone] Fax [phone]

## 2013-05-05 ENCOUNTER — Telehealth: Payer: Self-pay | Admitting: *Deleted

## 2013-05-05 NOTE — Telephone Encounter (Signed)
Spoke with patient and she said that she received results from NCV/EMG but did not understand, wants Dr Leonie Man to call her and explain because he speaks her language.

## 2013-05-06 NOTE — Progress Notes (Signed)
Quick Note:  I called and gave her the results of labs as normal. She verbalized understanding. ______

## 2013-05-11 NOTE — Telephone Encounter (Signed)
I spoke to Dr Collene Mares referring Md who stated she will call and explain NCV results to patient. She was advised to wear wrist splints for carpal tunnel and limit activities with repititive wrist flexion

## 2013-05-14 ENCOUNTER — Telehealth: Payer: Self-pay | Admitting: Hematology and Oncology

## 2013-06-08 ENCOUNTER — Ambulatory Visit: Payer: Medicare Other | Admitting: Neurology

## 2013-06-09 ENCOUNTER — Telehealth: Payer: Self-pay | Admitting: Neurology

## 2013-06-09 NOTE — Telephone Encounter (Signed)
Pt called to make an appointment with Dr. Leonie Man.  The first available appointment is 07-08-13. She did not want to wait that long. When speaking with the patient she wasn't able to understand some of my questions as English is her second language. She did state that she is having problems with her arm and leg and would like an appointment. Please call to advise if there is an early appointment available.  Thank you

## 2013-06-09 NOTE — Telephone Encounter (Signed)
Pt called back to check the status of getting an earlier appointment.  Currently the first available 09-21-13.  Please call to advise.  Thank you

## 2013-06-10 NOTE — Telephone Encounter (Signed)
Pt called this morning to check on the earlier appointment.  Please call.  Thank you

## 2013-06-10 NOTE — Telephone Encounter (Signed)
Called patient to schedule appt,confirmed, requested to be added to cancellation as well.

## 2013-06-11 DIAGNOSIS — N899 Noninflammatory disorder of vagina, unspecified: Secondary | ICD-10-CM | POA: Diagnosis not present

## 2013-06-11 DIAGNOSIS — L293 Anogenital pruritus, unspecified: Secondary | ICD-10-CM | POA: Diagnosis not present

## 2013-06-28 ENCOUNTER — Other Ambulatory Visit: Payer: Self-pay | Admitting: *Deleted

## 2013-06-28 MED ORDER — LOSARTAN POTASSIUM 100 MG PO TABS: 100.0000 mg | ORAL_TABLET | Freq: Every day | ORAL | Status: DC

## 2013-07-08 ENCOUNTER — Other Ambulatory Visit: Payer: Self-pay | Admitting: Obstetrics & Gynecology

## 2013-07-08 DIAGNOSIS — R5381 Other malaise: Secondary | ICD-10-CM

## 2013-07-08 DIAGNOSIS — M8580 Other specified disorders of bone density and structure, unspecified site: Secondary | ICD-10-CM

## 2013-07-30 ENCOUNTER — Other Ambulatory Visit: Payer: Medicare Other

## 2013-08-03 ENCOUNTER — Telehealth: Payer: Self-pay | Admitting: *Deleted

## 2013-08-03 ENCOUNTER — Ambulatory Visit (HOSPITAL_BASED_OUTPATIENT_CLINIC_OR_DEPARTMENT_OTHER): Payer: Medicare Other

## 2013-08-03 DIAGNOSIS — D638 Anemia in other chronic diseases classified elsewhere: Secondary | ICD-10-CM

## 2013-08-03 DIAGNOSIS — D649 Anemia, unspecified: Secondary | ICD-10-CM | POA: Diagnosis not present

## 2013-08-03 LAB — CBC & DIFF AND RETIC
BASO%: 0.2 % (ref 0.0–2.0)
Basophils Absolute: 0 10*3/uL (ref 0.0–0.1)
EOS%: 3.7 % (ref 0.0–7.0)
Eosinophils Absolute: 0.2 10*3/uL (ref 0.0–0.5)
HCT: 32.1 % — ABNORMAL LOW (ref 34.8–46.6)
HGB: 10.6 g/dL — ABNORMAL LOW (ref 11.6–15.9)
Immature Retic Fract: 1.2 % — ABNORMAL LOW (ref 1.60–10.00)
LYMPH%: 34.9 % (ref 14.0–49.7)
MCH: 26.8 pg (ref 25.1–34.0)
MCHC: 33 g/dL (ref 31.5–36.0)
MCV: 81.3 fL (ref 79.5–101.0)
MONO#: 0.4 10*3/uL (ref 0.1–0.9)
MONO%: 7.3 % (ref 0.0–14.0)
NEUT#: 2.6 10*3/uL (ref 1.5–6.5)
NEUT%: 53.9 % (ref 38.4–76.8)
Platelets: 246 10*3/uL (ref 145–400)
RBC: 3.95 10*6/uL (ref 3.70–5.45)
RDW: 13.5 % (ref 11.2–14.5)
Retic %: 0.74 % (ref 0.70–2.10)
Retic Ct Abs: 29.23 10*3/uL — ABNORMAL LOW (ref 33.70–90.70)
WBC: 4.9 10*3/uL (ref 3.9–10.3)
lymph#: 1.7 10*3/uL (ref 0.9–3.3)
nRBC: 0 % (ref 0–0)

## 2013-08-03 LAB — COMPREHENSIVE METABOLIC PANEL (CC13)
ALT: 11 U/L (ref 0–55)
AST: 16 U/L (ref 5–34)
Albumin: 4.1 g/dL (ref 3.5–5.0)
Alkaline Phosphatase: 59 U/L (ref 40–150)
Anion Gap: 10 mEq/L (ref 3–11)
BUN: 16.4 mg/dL (ref 7.0–26.0)
CO2: 23 mEq/L (ref 22–29)
Calcium: 9.5 mg/dL (ref 8.4–10.4)
Chloride: 94 mEq/L — ABNORMAL LOW (ref 98–109)
Creatinine: 1.1 mg/dL (ref 0.6–1.1)
Glucose: 108 mg/dl (ref 70–140)
Potassium: 4.9 mEq/L (ref 3.5–5.1)
Sodium: 127 mEq/L — ABNORMAL LOW (ref 136–145)
Total Bilirubin: 0.53 mg/dL (ref 0.20–1.20)
Total Protein: 6.4 g/dL (ref 6.4–8.3)

## 2013-08-03 LAB — HOLD TUBE, BLOOD BANK

## 2013-08-03 NOTE — Telephone Encounter (Signed)
She needs to be seen by PCP I am overbooked the next few days We can bring her in for lab today and reassess the lab quickly

## 2013-08-03 NOTE — Telephone Encounter (Signed)
Husband concerned,  States pt has become increasingly weak over the past week.  He asks if Dr. Alvy Bimler can see pt in the next few days?  Pt is scheduled to see Dr. Alvy Bimler on 5/22, but he asks if she can be seen any sooner?

## 2013-08-03 NOTE — Telephone Encounter (Signed)
Offered to husband we can do lab work today. He agreed and can bring pt in at 3 pm for lab.  Instructed him to wait for results.  He verbalized understanding.

## 2013-08-03 NOTE — Telephone Encounter (Signed)
CBC results reviewed by Dr. Alvy Bimler,  States Hgb stable, actually improved from last time.  Does not explain increased weakness.  She instructs pt to see PCP for increased weakness.   CBC results given to husband in lobby and explained to husband and pt that CBC is stable and to see PCP for weakness.  Keep appt here w/ Dr. Alvy Bimler as scheduled on 5/22 as scheduled.  Husband verbalized understanding.

## 2013-08-04 LAB — IRON AND TIBC CHCC
%SAT: 33 % (ref 21–57)
Iron: 92 ug/dL (ref 41–142)
TIBC: 276 ug/dL (ref 236–444)
UIBC: 184 ug/dL (ref 120–384)

## 2013-08-04 LAB — FERRITIN CHCC: Ferritin: 210 ng/ml (ref 9–269)

## 2013-08-05 LAB — HEMOGLOBINOPATHY EVALUATION
Hemoglobin Other: 0 %
Hgb A2 Quant: 2.3 % (ref 2.2–3.2)
Hgb A: 97.7 % (ref 96.8–97.8)
Hgb F Quant: 0 % (ref 0.0–2.0)
Hgb S Quant: 0 %

## 2013-08-05 LAB — SEDIMENTATION RATE: Sed Rate: 5 mm/hr (ref 0–22)

## 2013-08-05 LAB — ERYTHROPOIETIN: Erythropoietin: 7.7 m[IU]/mL (ref 2.6–18.5)

## 2013-08-13 ENCOUNTER — Telehealth: Payer: Self-pay | Admitting: Hematology and Oncology

## 2013-08-13 ENCOUNTER — Ambulatory Visit (HOSPITAL_BASED_OUTPATIENT_CLINIC_OR_DEPARTMENT_OTHER): Payer: Medicare Other | Admitting: Hematology and Oncology

## 2013-08-13 ENCOUNTER — Other Ambulatory Visit: Payer: Medicare Other

## 2013-08-13 VITALS — BP 130/45 | HR 65 | Temp 97.5°F | Resp 19 | Ht 61.0 in | Wt 113.3 lb

## 2013-08-13 DIAGNOSIS — D638 Anemia in other chronic diseases classified elsewhere: Secondary | ICD-10-CM

## 2013-08-13 DIAGNOSIS — D649 Anemia, unspecified: Secondary | ICD-10-CM | POA: Diagnosis not present

## 2013-08-13 DIAGNOSIS — R5381 Other malaise: Secondary | ICD-10-CM | POA: Diagnosis not present

## 2013-08-13 DIAGNOSIS — R5383 Other fatigue: Secondary | ICD-10-CM

## 2013-08-13 DIAGNOSIS — E871 Hypo-osmolality and hyponatremia: Secondary | ICD-10-CM

## 2013-08-13 NOTE — Telephone Encounter (Signed)
gv adn printed appt sched adn avs for pt fro NOV...lvm for Dr Justin Mend office to sched appt

## 2013-08-13 NOTE — Progress Notes (Signed)
[facility] OFFICE PROGRESS NOTE  [name],[name], MD DIAGNOSIS:  Chronic anemia, anemia chronic disease  SUMMARY OF HEMATOLOGIC HISTORY: This is a patient that has been followed here 4 years for chronic anemia. In 2011, she had a bone marrow aspirate and biopsy that came back normal. The patient has been receiving Aranesp injection for chronic anemia up until 2014. It was subsequently discontinued due stability of the anemia. INTERVAL HISTORY: Abigail Hoover 68 y.o. female returns for further followup. She complained of recent weakness. She complained of very mild leg swelling. She has no symptoms of anemia profound fatigue. Denies any chest pain shortness of breath or dizziness.  I have reviewed the past medical history, past surgical history, social history and family history with the patient and they are unchanged from previous note.  ALLERGIES:  is allergic to ezetimibe and atorvastatin.  MEDICATIONS:  Current Outpatient Prescriptions  Medication Sig Dispense Refill  . amLODipine (NORVASC) 10 MG tablet Take 10 mg by mouth daily.        Marland Kitchen aspirin 81 MG tablet Take 81 mg by mouth daily.        . Blood Glucose Monitoring Suppl (ONE TOUCH ULTRA 2) W/DEVICE KIT Use to check blood sugars 1 time per day  1 each  0  . Cholecalciferol (VITAMIN D3) 1000 UNITS CAPS Take 1,000 Units by mouth 2 (two) times daily.       . Colesevelam HCl 3.75 G PACK Take by mouth daily.      Marland Kitchen dexlansoprazole (DEXILANT) 60 MG capsule Take 60 mg by mouth daily.        . furosemide (LASIX) 20 MG tablet Take 1 tablet (20 mg total) by mouth daily as needed.  30 tablet  5  . gabapentin (NEURONTIN) 300 MG capsule Take 300 mg by mouth 4 (four) times daily.      Marland Kitchen levothyroxine (SYNTHROID) 88 MCG tablet Take 1 tablet (88 mcg total) by mouth daily.  90 tablet  3  . losartan (COZAAR) 100 MG tablet Take 1 tablet (100 mg total) by mouth daily.  90 tablet  1  . metoprolol succinate (TOPROL-XL) 100 MG 24 hr  tablet Take 100 mg by mouth 2 (two) times daily. Take with or immediately following a meal.      . miglitol (GLYSET) 50 MG tablet One tablet by mouth twice a day  180 tablet  3  . MYRBETRIQ 25 MG TB24 Take 1 tablet by mouth Daily.      . pentosan polysulfate (ELMIRON) 100 MG capsule Take 100 mg by mouth 3 (three) times daily before meals.       Marland Kitchen PROCTOSOL HC 2.5 % rectal cream       . sitaGLIPtin-metformin (JANUMET) 50-1000 MG per tablet Take 1 tablet by mouth twice a day  180 tablet  3   No current facility-administered medications for this visit.     REVIEW OF SYSTEMS:   Constitutional: Denies fevers, chills or night sweats Eyes: Denies blurriness of vision Ears, nose, mouth, throat, and face: Denies mucositis or sore throat Respiratory: Denies cough, dyspnea or wheezes Cardiovascular: Denies palpitation, chest discomfort  Gastrointestinal:  Denies nausea, heartburn or change in bowel habits Skin: Denies abnormal skin rashes Lymphatics: Denies new lymphadenopathy or easy bruising Neurological:Denies numbness, tingling or new weaknesses Behavioral/Psych: Mood is stable, no new changes  All other systems were reviewed with the patient and are negative.  PHYSICAL EXAMINATION: ECOG PERFORMANCE STATUS: 1 - Symptomatic but completely ambulatory  Filed Vitals:   08/13/13 1338  BP: 130/45  Pulse: 65  Temp: 97.5 F (36.4 C)  Resp: 19   Filed Weights   08/13/13 1338  Weight: 113 lb 4.8 oz (51.393 kg)    GENERAL:alert, no distress and comfortable. She looks thin but not cachectic SKIN: skin color, texture, turgor are normal, no rashes or significant lesions EYES: normal, Conjunctiva are pink and non-injected, sclera clear OROPHARYNX:no exudate, no erythema and lips, buccal mucosa, and tongue normal  NECK: supple, thyroid normal size, non-tender, without nodularity LYMPH:  no palpable lymphadenopathy in the cervical, axillary or inguinal LUNGS: clear to auscultation and percussion  with normal breathing effort HEART: regular rate & rhythm and no murmurs and no lower extremity edema ABDOMEN:abdomen soft, non-tender and normal bowel sounds Musculoskeletal:no cyanosis of digits and no clubbing  NEURO: alert & oriented x 3 with fluent speech, no focal motor/sensory deficits Left breast examination show well-healed lumpectomy scar with no abnormalities LABORATORY DATA:  I have reviewed the data as listed No results found for this or any previous visit (from the past 48 hour(s)).  Lab Results  Component Value Date   WBC 4.9 08/03/2013   HGB 10.6* 08/03/2013   HCT 32.1* 08/03/2013   MCV 81.3 08/03/2013   PLT 246 08/03/2013   ASSESSMENT & PLAN:  #1 chronic anemia Cause is unknown, likely anemia of chronic disease. There is no indication to give her erythropoietin stimulating agents for hemoglobin greater than 10. #2 weakness I suspect this is due to new onset hyponatremia. I will refer her to a nephrologist for this.  All questions were answered. The patient knows to call the clinic with any problems, questions or concerns. No barriers to learning was detected.  I spent 15 minutes counseling the patient face to face. The total time spent in the appointment was 20 minutes and more than 50% was on counseling.     [name], MD 08/13/2013 2:06 PM

## 2013-08-16 ENCOUNTER — Ambulatory Visit: Payer: Medicare Other | Admitting: Hematology and Oncology

## 2013-08-16 ENCOUNTER — Other Ambulatory Visit: Payer: Medicare Other

## 2013-08-19 ENCOUNTER — Other Ambulatory Visit: Payer: Self-pay | Admitting: Hematology and Oncology

## 2013-08-19 ENCOUNTER — Telehealth: Payer: Self-pay | Admitting: Hematology and Oncology

## 2013-08-19 ENCOUNTER — Telehealth: Payer: Self-pay | Admitting: *Deleted

## 2013-08-19 DIAGNOSIS — E871 Hypo-osmolality and hyponatremia: Secondary | ICD-10-CM

## 2013-08-19 NOTE — Telephone Encounter (Signed)
Received notice from [facility] that they have received referral for pt but it may be 3 to 4 months before they can see pt.  They have ranked her as low priority (a 4 on scale of 1 to 4).   I called [facility] [phone] and left message on referral line asking if pt can be seen any sooner by first available.  It does not have to be Dr. Justin Mend, ok for pt to see first avail nephrologist.  Dr. Alvy Bimler is aware.  Called husband back and notified him of above. He verbalized understanding.

## 2013-08-19 NOTE — Telephone Encounter (Signed)
I put order again On his last POF, I have requested the referral

## 2013-08-19 NOTE — Telephone Encounter (Signed)
Faxed pt medical recods to [facility] [phone]

## 2013-08-19 NOTE — Telephone Encounter (Signed)
Husband tried to make appt for pt w/ Dr. Justin Mend. Says Dr. Alvy Bimler instructed her to see Kidney MD. Dr. Jason Nest office says they need a referral from Dr. Alvy Bimler to make appt.

## 2013-08-23 ENCOUNTER — Telehealth: Payer: Self-pay | Admitting: Hematology and Oncology

## 2013-08-23 NOTE — Telephone Encounter (Signed)
s.w.l pt and advised that it will be 3-4 mths b4 nephrology appt per [facility]...pt ok and aware...MD aware also

## 2013-08-26 ENCOUNTER — Telehealth: Payer: Self-pay | Admitting: *Deleted

## 2013-08-26 NOTE — Telephone Encounter (Signed)
Left VM for husband/pt asking to return nurse's call.  Instructed for pt to limit water intake.  May drink gatorade, while waiting to see Kidney doctor.

## 2013-08-26 NOTE — Telephone Encounter (Signed)
Message copied by Cathlean Cower on Thu Aug 26, 2013  3:52 PM ------      Message from: [facility] [place]      Created: Mon Aug 23, 2013  3:52 PM      Regarding: RE: pt nephrology       Cameo, there is nothing I can do      Please advise patient to restrict free water intake, meaning limit water only, she should drink gatorade if possible      ----- Message -----         From: Ilean China         Sent: 08/23/2013   3:39 PM           To: Cathlean Cower, RN, Ilean China, #      Subject: pt nephrology                                            [place],            [facility] called me back and advised that Dr. Justin Mend rated pt at a level 4 and it will be 3-29mths before they will call pt for appt.       ------

## 2013-09-01 ENCOUNTER — Telehealth: Payer: Self-pay | Admitting: Neurology

## 2013-09-01 ENCOUNTER — Encounter: Payer: Self-pay | Admitting: Neurology

## 2013-09-01 NOTE — Telephone Encounter (Signed)
Left message for patient rescheduling 09/21/13 appointment per Dr. Clydene Fake schedule, printed and mailed letter with new appointment time.

## 2013-09-06 DIAGNOSIS — E871 Hypo-osmolality and hyponatremia: Secondary | ICD-10-CM | POA: Diagnosis not present

## 2013-09-06 DIAGNOSIS — R05 Cough: Secondary | ICD-10-CM | POA: Diagnosis not present

## 2013-09-06 DIAGNOSIS — R059 Cough, unspecified: Secondary | ICD-10-CM | POA: Diagnosis not present

## 2013-09-07 ENCOUNTER — Ambulatory Visit
Admission: RE | Admit: 2013-09-07 | Discharge: 2013-09-07 | Disposition: A | Discharge status: Home / Self Care | Payer: Medicare Other | Source: Ambulatory Visit | Attending: Nephrology | Admitting: Nephrology

## 2013-09-07 ENCOUNTER — Other Ambulatory Visit: Payer: Self-pay | Admitting: Nephrology

## 2013-09-07 DIAGNOSIS — R05 Cough: Secondary | ICD-10-CM

## 2013-09-07 DIAGNOSIS — R059 Cough, unspecified: Secondary | ICD-10-CM

## 2013-09-13 ENCOUNTER — Other Ambulatory Visit: Payer: Self-pay | Admitting: *Deleted

## 2013-09-13 ENCOUNTER — Other Ambulatory Visit: Payer: Self-pay

## 2013-09-13 ENCOUNTER — Telehealth: Payer: Self-pay | Admitting: Endocrinology

## 2013-09-13 DIAGNOSIS — Z1231 Encounter for screening mammogram for malignant neoplasm of breast: Secondary | ICD-10-CM

## 2013-09-13 MED ORDER — SITAGLIPTIN PHOS-METFORMIN HCL 50-1000 MG PO TABS: ORAL_TABLET | ORAL | Status: DC

## 2013-09-13 MED ORDER — FUROSEMIDE 20 MG PO TABS: 20.0000 mg | ORAL_TABLET | Freq: Every day | ORAL | Status: DC | PRN

## 2013-09-13 MED ORDER — MIGLITOL 50 MG PO TABS: ORAL_TABLET | ORAL | Status: DC

## 2013-09-13 NOTE — Telephone Encounter (Signed)
rxs sent

## 2013-09-13 NOTE — Telephone Encounter (Signed)
Please call in 3 mon supply for janumet, furosemide, and glyset to [facility] pharmacy

## 2013-09-20 ENCOUNTER — Ambulatory Visit
Admission: RE | Admit: 2013-09-20 | Discharge: 2013-09-20 | Disposition: A | Discharge status: Home / Self Care | Payer: Medicare Other | Source: Ambulatory Visit | Attending: Obstetrics & Gynecology | Admitting: Obstetrics & Gynecology

## 2013-09-20 DIAGNOSIS — M899 Disorder of bone, unspecified: Secondary | ICD-10-CM | POA: Diagnosis not present

## 2013-09-20 DIAGNOSIS — M949 Disorder of cartilage, unspecified: Secondary | ICD-10-CM | POA: Diagnosis not present

## 2013-09-20 DIAGNOSIS — M8580 Other specified disorders of bone density and structure, unspecified site: Secondary | ICD-10-CM

## 2013-09-21 ENCOUNTER — Ambulatory Visit: Payer: Self-pay | Admitting: Neurology

## 2013-09-29 ENCOUNTER — Ambulatory Visit (INDEPENDENT_AMBULATORY_CARE_PROVIDER_SITE_OTHER): Payer: Medicare Other | Admitting: General Surgery

## 2013-09-29 ENCOUNTER — Encounter (INDEPENDENT_AMBULATORY_CARE_PROVIDER_SITE_OTHER): Payer: Self-pay | Admitting: General Surgery

## 2013-09-29 VITALS — BP 116/70 | HR 72 | Temp 97.4°F | Ht 67.0 in | Wt 108.0 lb

## 2013-09-29 DIAGNOSIS — R350 Frequency of micturition: Secondary | ICD-10-CM | POA: Diagnosis not present

## 2013-09-29 DIAGNOSIS — Z803 Family history of malignant neoplasm of breast: Secondary | ICD-10-CM

## 2013-09-29 DIAGNOSIS — N644 Mastodynia: Secondary | ICD-10-CM

## 2013-09-29 NOTE — Progress Notes (Signed)
Subjective:     Patient ID: Abigail Hoover, female   DOB: 04-16-1945, 68 y.o.   MRN: [id number]  HPI  Ms. Rubis is at high-risk for breast cancer and presents for her semi-annual examination.  She denies any changes in her breasts. She still has some intermittent left breast pain.   Her mammogram is due next month.   Review of Systems     Objective:   Physical Exam Gen.-she looks well and is in no acute distress.  -Breasts-no dominant masses or suspicious skin changes are present.  Lymph nodes-no enlarged supraclavicular or axillary adenopathy.    Assessment:     High risk for breast cancer with no clinical evidence of malignancy.  Mammogram is due to be done next month.   Plan:     Return visit 6 months.

## 2013-09-29 NOTE — Patient Instructions (Signed)
Call if you find any new lumps in your breasts or chest wall. 

## 2013-10-05 DIAGNOSIS — M19079 Primary osteoarthritis, unspecified ankle and foot: Secondary | ICD-10-CM | POA: Diagnosis not present

## 2013-10-29 ENCOUNTER — Ambulatory Visit
Admission: RE | Admit: 2013-10-29 | Discharge: 2013-10-29 | Disposition: A | Discharge status: Home / Self Care | Payer: Medicare Other | Source: Ambulatory Visit

## 2013-10-29 DIAGNOSIS — Z1231 Encounter for screening mammogram for malignant neoplasm of breast: Secondary | ICD-10-CM | POA: Diagnosis not present

## 2013-11-02 DIAGNOSIS — M25519 Pain in unspecified shoulder: Secondary | ICD-10-CM | POA: Diagnosis not present

## 2013-11-05 DIAGNOSIS — E871 Hypo-osmolality and hyponatremia: Secondary | ICD-10-CM | POA: Diagnosis not present

## 2013-11-05 DIAGNOSIS — R05 Cough: Secondary | ICD-10-CM | POA: Diagnosis not present

## 2013-11-05 DIAGNOSIS — R059 Cough, unspecified: Secondary | ICD-10-CM | POA: Diagnosis not present

## 2013-11-08 DIAGNOSIS — I10 Essential (primary) hypertension: Secondary | ICD-10-CM | POA: Diagnosis not present

## 2013-11-08 DIAGNOSIS — D638 Anemia in other chronic diseases classified elsewhere: Secondary | ICD-10-CM | POA: Diagnosis not present

## 2013-11-08 DIAGNOSIS — E119 Type 2 diabetes mellitus without complications: Secondary | ICD-10-CM | POA: Diagnosis not present

## 2013-11-09 DIAGNOSIS — M25519 Pain in unspecified shoulder: Secondary | ICD-10-CM | POA: Diagnosis not present

## 2013-11-09 DIAGNOSIS — M719 Bursopathy, unspecified: Secondary | ICD-10-CM | POA: Diagnosis not present

## 2013-11-09 DIAGNOSIS — M67919 Unspecified disorder of synovium and tendon, unspecified shoulder: Secondary | ICD-10-CM | POA: Diagnosis not present

## 2013-11-10 DIAGNOSIS — R350 Frequency of micturition: Secondary | ICD-10-CM | POA: Diagnosis not present

## 2013-11-10 DIAGNOSIS — N949 Unspecified condition associated with female genital organs and menstrual cycle: Secondary | ICD-10-CM | POA: Diagnosis not present

## 2013-11-10 DIAGNOSIS — R109 Unspecified abdominal pain: Secondary | ICD-10-CM | POA: Diagnosis not present

## 2013-11-10 DIAGNOSIS — N3010 Interstitial cystitis (chronic) without hematuria: Secondary | ICD-10-CM | POA: Diagnosis not present

## 2013-11-10 DIAGNOSIS — R3915 Urgency of urination: Secondary | ICD-10-CM | POA: Diagnosis not present

## 2013-11-10 DIAGNOSIS — R300 Dysuria: Secondary | ICD-10-CM | POA: Diagnosis not present

## 2013-11-12 DIAGNOSIS — M4712 Other spondylosis with myelopathy, cervical region: Secondary | ICD-10-CM | POA: Diagnosis not present

## 2013-11-12 DIAGNOSIS — M542 Cervicalgia: Secondary | ICD-10-CM | POA: Diagnosis not present

## 2013-11-12 DIAGNOSIS — M5030 Other cervical disc degeneration, unspecified cervical region: Secondary | ICD-10-CM | POA: Diagnosis not present

## 2013-11-15 ENCOUNTER — Other Ambulatory Visit: Payer: Self-pay | Admitting: Neurosurgery

## 2013-11-15 DIAGNOSIS — M4712 Other spondylosis with myelopathy, cervical region: Secondary | ICD-10-CM

## 2013-11-17 DIAGNOSIS — M25519 Pain in unspecified shoulder: Secondary | ICD-10-CM | POA: Diagnosis not present

## 2013-11-17 DIAGNOSIS — M67919 Unspecified disorder of synovium and tendon, unspecified shoulder: Secondary | ICD-10-CM | POA: Diagnosis not present

## 2013-11-23 ENCOUNTER — Ambulatory Visit
Admission: RE | Admit: 2013-11-23 | Discharge: 2013-11-23 | Disposition: A | Discharge status: Home / Self Care | Payer: Medicare Other | Source: Ambulatory Visit | Attending: Neurosurgery | Admitting: Neurosurgery

## 2013-11-23 DIAGNOSIS — M4712 Other spondylosis with myelopathy, cervical region: Secondary | ICD-10-CM

## 2013-11-24 DIAGNOSIS — M67919 Unspecified disorder of synovium and tendon, unspecified shoulder: Secondary | ICD-10-CM | POA: Diagnosis not present

## 2013-11-24 DIAGNOSIS — M25519 Pain in unspecified shoulder: Secondary | ICD-10-CM | POA: Diagnosis not present

## 2013-12-03 ENCOUNTER — Other Ambulatory Visit: Payer: Medicare Other

## 2013-12-03 DIAGNOSIS — M79609 Pain in unspecified limb: Secondary | ICD-10-CM | POA: Diagnosis not present

## 2013-12-04 ENCOUNTER — Ambulatory Visit
Admission: RE | Admit: 2013-12-04 | Discharge: 2013-12-04 | Disposition: A | Discharge status: Home / Self Care | Payer: Medicare Other | Source: Ambulatory Visit | Attending: Neurosurgery | Admitting: Neurosurgery

## 2013-12-04 DIAGNOSIS — M5030 Other cervical disc degeneration, unspecified cervical region: Secondary | ICD-10-CM | POA: Diagnosis not present

## 2013-12-04 DIAGNOSIS — M47812 Spondylosis without myelopathy or radiculopathy, cervical region: Secondary | ICD-10-CM | POA: Diagnosis not present

## 2013-12-07 ENCOUNTER — Telehealth: Payer: Self-pay | Admitting: Endocrinology

## 2013-12-07 DIAGNOSIS — M4712 Other spondylosis with myelopathy, cervical region: Secondary | ICD-10-CM | POA: Diagnosis not present

## 2013-12-07 DIAGNOSIS — M79609 Pain in unspecified limb: Secondary | ICD-10-CM | POA: Diagnosis not present

## 2013-12-07 DIAGNOSIS — M5030 Other cervical disc degeneration, unspecified cervical region: Secondary | ICD-10-CM | POA: Diagnosis not present

## 2013-12-07 NOTE — Telephone Encounter (Signed)
Patient is retunring your call.

## 2013-12-09 ENCOUNTER — Encounter (INDEPENDENT_AMBULATORY_CARE_PROVIDER_SITE_OTHER): Payer: Self-pay

## 2013-12-09 ENCOUNTER — Encounter: Payer: Self-pay | Admitting: Neurology

## 2013-12-09 ENCOUNTER — Ambulatory Visit (INDEPENDENT_AMBULATORY_CARE_PROVIDER_SITE_OTHER): Payer: Medicare Other | Admitting: Neurology

## 2013-12-09 VITALS — BP 112/61 | HR 62 | Wt 110.2 lb

## 2013-12-09 DIAGNOSIS — E1349 Other specified diabetes mellitus with other diabetic neurological complication: Secondary | ICD-10-CM | POA: Diagnosis not present

## 2013-12-09 DIAGNOSIS — E1142 Type 2 diabetes mellitus with diabetic polyneuropathy: Secondary | ICD-10-CM | POA: Diagnosis not present

## 2013-12-09 DIAGNOSIS — G5600 Carpal tunnel syndrome, unspecified upper limb: Secondary | ICD-10-CM

## 2013-12-09 DIAGNOSIS — E0842 Diabetes mellitus due to underlying condition with diabetic polyneuropathy: Secondary | ICD-10-CM

## 2013-12-09 DIAGNOSIS — E1140 Type 2 diabetes mellitus with diabetic neuropathy, unspecified: Secondary | ICD-10-CM | POA: Insufficient documentation

## 2013-12-09 DIAGNOSIS — G5603 Carpal tunnel syndrome, bilateral upper limbs: Secondary | ICD-10-CM

## 2013-12-09 NOTE — Patient Instructions (Signed)
I had a long discussion with the patient and her husband regarding her symptoms, findings on the nerve conduction study and lab results and answered questions. I encouraged her to wear wrist extension splints at all times and to limit activities with a rapid repetitive flexion wrist movements. Continue gabapentin for paresthesias. Maintain strict control of diabetes with hemoglobin A1c goal below 6.5. Return for followup in 3 months call earlier if necessary Carpal Tunnel Syndrome The carpal tunnel is a narrow area located on the palm side of your wrist. The tunnel is formed by the wrist bones and ligaments. Nerves, blood vessels, and tendons pass through the carpal tunnel. Repeated wrist motion or certain diseases may cause swelling within the tunnel. This swelling pinches the main nerve in the wrist (median nerve) and causes the painful hand and arm condition called carpal tunnel syndrome. CAUSES   Repeated wrist motions.  Wrist injuries.  Certain diseases like arthritis, diabetes, alcoholism, hyperthyroidism, and kidney failure.  Obesity.  Pregnancy. SYMPTOMS   A "pins and needles" feeling in your fingers or hand, especially in your thumb, index and middle fingers.  Tingling or numbness in your fingers or hand.  An aching feeling in your entire arm, especially when your wrist and elbow are bent for long periods of time.  Wrist pain that goes up your arm to your shoulder.  Pain that goes down into your palm or fingers.  A weak feeling in your hands. DIAGNOSIS  Your health care provider will take your history and perform a physical exam. An electromyography test may be needed. This test measures electrical signals sent out by your nerves into the muscles. The electrical signals are usually slowed by carpal tunnel syndrome. You may also need X-rays. TREATMENT  Carpal tunnel syndrome may clear up by itself. Your health care provider may recommend a wrist splint or medicine such as a  nonsteroidal anti-inflammatory medicine. Cortisone injections may help. Sometimes, surgery may be needed to free the pinched nerve.  HOME CARE INSTRUCTIONS   Take all medicine as directed by your health care provider. Only take over-the-counter or prescription medicines for pain, discomfort, or fever as directed by your health care provider.  If you were given a splint to keep your wrist from bending, wear it as directed. It is important to wear the splint at night. Wear the splint for as long as you have pain or numbness in your hand, arm, or wrist. This may take 1 to 2 months.  Rest your wrist from any activity that may be causing your pain. If your symptoms are work-related, you may need to talk to your employer about changing to a job that does not require using your wrist.  Put ice on your wrist after long periods of wrist activity.  Put ice in a plastic bag.  Place a towel between your skin and the bag.  Leave the ice on for 15-20 minutes, 03-04 times a day.  Keep all follow-up visits as directed by your health care provider. This includes any orthopedic referrals, physical therapy, and rehabilitation. Any delay in getting necessary care could result in a delay or failure of your condition to heal. SEEK IMMEDIATE MEDICAL CARE IF:   You have new, unexplained symptoms.  Your symptoms get worse and are not helped or controlled with medicines. MAKE SURE YOU:   Understand these instructions.  Will watch your condition.  Will get help right away if you are not doing well or get worse. Document Released: 03/08/2000 Document  Revised: 07/26/2013 Document Reviewed: 01/25/2011 [facility] Patient Information 2015 [place], [place]. This information is not intended to replace advice given to you by your health care provider. Make sure you discuss any questions you have with your health care provider.

## 2013-12-09 NOTE — Progress Notes (Signed)
[facility] [address] [place]. [place] [address] [phone]       OFFICE FOLLOW UP VISIT NOTE  Abigail Hoover Date of Birth:  1945/04/15 Medical Record Number:  [id number]   Referring MD:  Mickel Duhamel  Reason for Referral:  Numbness hans and feet  HPI: 22 year [place] lady three-month history of tingling, numbness and burning in her hands and feet. This involves mainly the bottom of her feet and fingertips and hand it is present off and on throughout the day. At times her fingers heart cramped. She has some trouble with fine motor skills by cutting vegetables and occasionally drops objects. She denies any significant problem with balance or walking. She denies significant weakness in the legs. She has tried Lyrica in the past which did not help and stopped it. She is currently on gabapentin 300 mg three times daily which actually she has been taking for 5 years. She has history of diabetes but states his sugars are well controlled and lasti HbA1c 6.7 1 month ago. She also complains of mild short-term memory difficulties which his hand for last several years. 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  She has trouble remembering recent information   but at times but she can remember later.. she is still quite active and independent in activities of daily living and does not need any help. She has history of degenerative cervical spine disease and underwent surgery by Dr. Sherwood Gambler. She did have some paresthesias in her hand prior to the surgery which have been persistent. She denies significant neck pain, radicular pain or trouble with gait or balance . UPDATE 12/09/2013 : She returns for followup of her initial consultation 7 months ago. She is tolerating increased does of bowel content but still has some paresthesias but these are not as bothersome. She continues to complain of discomfort in her hands and in her fingers after using her hands a lot. She admits to doing a lot of  activities with rapid repetitive wrist flexion by cocaine. She did have conduction EMG study done on 04/25/13 by Dr. 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 Peripheral neuropathy panel labs were all normal. I had asked the patient to wear bilateral wrist extension splints but she has not been doing so. She states her diabetes control has been quite good. She has had no new neurological complaints. Cozaar has been discontinued by her primary physician because of low blood pressure ROS:   14 system review of systems is positive for , numbness, burning, and  Tingling,memory loss and  all the systems negative  PMH:  Past Medical History  Diagnosis Date  . Diabetes mellitus   . Hypertension   . Thyroid disease   . Arthritis   . Anemia   . GERD (gastroesophageal reflux disease)   . Hypercholesterolemia   . Bladder irritation   . Iron deficiency anemia, unspecified   . Hypothyroidism   . Anxiety   . Psoriasis     Social History:  History   Social History  . Marital Status: Married    Spouse Name: N/A    Number of Children: 1  . Years of Education: college   Occupational History  . Homemaker    Social History Main Topics  . Smoking status: Never Smoker   . Smokeless tobacco: Never Used  . Alcohol Use: No  . Drug Use: No  . Sexual Activity: Not on file  Other Topics Concern  . Not on file   Social History Narrative  . No narrative on file    Medications:   Current Outpatient Prescriptions on File Prior to Visit  Medication Sig Dispense Refill  . amLODipine (NORVASC) 10 MG tablet Take 10 mg by mouth daily.        Marland Kitchen aspirin 81 MG tablet Take 81 mg by mouth daily.        . Blood Glucose Monitoring Suppl (ONE TOUCH ULTRA 2) W/DEVICE KIT Use to check blood sugars 1 time per day  1 each  0  . Cholecalciferol (VITAMIN D3) 1000 UNITS CAPS Take 1,000 Units by mouth 2 (two) times daily.       .  Colesevelam HCl 3.75 G PACK Take by mouth daily.      Marland Kitchen dexlansoprazole (DEXILANT) 60 MG capsule Take 60 mg by mouth daily.        . furosemide (LASIX) 20 MG tablet Take 1 tablet (20 mg total) by mouth daily as needed.  90 tablet  1  . gabapentin (NEURONTIN) 300 MG capsule Take 300 mg by mouth 4 (four) times daily.      Marland Kitchen levothyroxine (SYNTHROID) 88 MCG tablet Take 1 tablet (88 mcg total) by mouth daily.  90 tablet  3  . metoprolol succinate (TOPROL-XL) 100 MG 24 hr tablet Take 100 mg by mouth 2 (two) times daily. Take with or immediately following a meal.      . miglitol (GLYSET) 50 MG tablet One tablet by mouth twice a day  180 tablet  1  . MYRBETRIQ 25 MG TB24 Take 1 tablet by mouth Daily.      . pentosan polysulfate (ELMIRON) 100 MG capsule Take 100 mg by mouth 3 (three) times daily before meals.       Marland Kitchen PROCTOSOL HC 2.5 % rectal cream       . sitaGLIPtin-metformin (JANUMET) 50-1000 MG per tablet Take 1 tablet by mouth twice a day  180 tablet  1   No current facility-administered medications on file prior to visit.    Allergies:   Allergies  Allergen Reactions  . Ezetimibe Itching  . Atorvastatin     REACTION: rash    Physical Exam General: frail elderly [place] Asian lady, seated, in no evident distress Head: head normocephalic and atraumatic. Orohparynx benign Neck: supple with no carotid or supraclavicular bruits Cardiovascular: regular rate and rhythm, no murmurs Musculoskeletal: no deformity. Tinel sign negative over both wrists Skin:  no rash/petichiae Vascular:  Normal pulses all extremities Filed Vitals:   12/09/13 1447  BP: 112/61  Pulse: 62    Neurologic Exam Mental Status: Awake and fully alert. Oriented to place and time. Recent and remote memory intact. Attention span, concentration and fund of knowledge appropriate. Mood and affect appropriate.  Cranial Nerves: Fundoscopic exam not done   Pupils equal, briskly reactive to light. Extraocular movements full  without nystagmus. Visual fields full to confrontation. Hearing intact. Facial sensation intact. Face, tongue, palate moves normally and symmetrically.  Motor: Normal bulk and tone. Normal strength in all tested extremity muscles. Sensory.: intact to touch and pinprick sensation but impaired position and  vibratory. Sensation from ankle down bilaterally. Tinel sign negative over both wrists. Coordination: Rapid alternating movements normal in all extremities. Finger-to-nose and heel-to-shin performed accurately bilaterally. Gait and Station: Arises from chair without difficulty. Stance is normal. Gait demonstrates normal stride length and balance . Not able to heel, toe and tandem walk  Reflexes: 1+  and symmetric except ankle jerks absent and right supinator and triceps jerks brisk 2 + bilaterally. Toes downgoing.       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   PLAN:I had a long discussion with the patient and her husband regarding her symptoms, findings on the nerve conduction study and lab results and answered questions. I encouraged her to wear wrist extension splints at all times and to limit activities with a rapid repetitive flexion wrist movements. Continue gabapentin 300 mg four times dailyfor paresthesias. Maintain strict control of diabetes with hemoglobin A1c goal below 6.5. Return for followup in 3 months call earlier if necessary   Note: This document was prepared with digital dictation and possible smart phrase technology. Any transcriptional errors that result from this process are unintentional.

## 2013-12-13 ENCOUNTER — Ambulatory Visit (INDEPENDENT_AMBULATORY_CARE_PROVIDER_SITE_OTHER): Payer: Medicare Other | Admitting: Ophthalmology

## 2013-12-13 DIAGNOSIS — E1139 Type 2 diabetes mellitus with other diabetic ophthalmic complication: Secondary | ICD-10-CM | POA: Diagnosis not present

## 2013-12-13 DIAGNOSIS — E1165 Type 2 diabetes mellitus with hyperglycemia: Secondary | ICD-10-CM | POA: Diagnosis not present

## 2013-12-13 DIAGNOSIS — H35039 Hypertensive retinopathy, unspecified eye: Secondary | ICD-10-CM

## 2013-12-13 DIAGNOSIS — H43819 Vitreous degeneration, unspecified eye: Secondary | ICD-10-CM

## 2013-12-13 DIAGNOSIS — I10 Essential (primary) hypertension: Secondary | ICD-10-CM | POA: Diagnosis not present

## 2013-12-13 DIAGNOSIS — E11319 Type 2 diabetes mellitus with unspecified diabetic retinopathy without macular edema: Secondary | ICD-10-CM

## 2013-12-14 DIAGNOSIS — M67919 Unspecified disorder of synovium and tendon, unspecified shoulder: Secondary | ICD-10-CM | POA: Diagnosis not present

## 2013-12-14 DIAGNOSIS — M722 Plantar fascial fibromatosis: Secondary | ICD-10-CM | POA: Diagnosis not present

## 2013-12-15 ENCOUNTER — Other Ambulatory Visit (INDEPENDENT_AMBULATORY_CARE_PROVIDER_SITE_OTHER): Payer: Medicare Other

## 2013-12-15 ENCOUNTER — Other Ambulatory Visit: Payer: Self-pay | Admitting: *Deleted

## 2013-12-15 ENCOUNTER — Other Ambulatory Visit: Payer: Medicare Other

## 2013-12-15 DIAGNOSIS — E1165 Type 2 diabetes mellitus with hyperglycemia: Principal | ICD-10-CM

## 2013-12-15 DIAGNOSIS — IMO0001 Reserved for inherently not codable concepts without codable children: Secondary | ICD-10-CM

## 2013-12-15 LAB — BASIC METABOLIC PANEL
BUN: 15 mg/dL (ref 6–23)
CO2: 23 mEq/L (ref 19–32)
Calcium: 9.3 mg/dL (ref 8.4–10.5)
Chloride: 103 mEq/L (ref 96–112)
Creatinine, Ser: 1 mg/dL (ref 0.4–1.2)
GFR: 57.48 mL/min — ABNORMAL LOW (ref >60.00)
Glucose, Bld: 129 mg/dL — ABNORMAL HIGH (ref 70–99)
Potassium: 4.9 mEq/L (ref 3.5–5.1)
Sodium: 137 mEq/L (ref 135–145)

## 2013-12-15 LAB — HEMOGLOBIN A1C: Hgb A1c MFr Bld: 6 % (ref 4.6–6.5)

## 2013-12-16 LAB — LIPID PANEL
Cholesterol: 157 mg/dL (ref 0–200)
HDL: 64 mg/dL (ref >39.00)
LDL Cholesterol: 68 mg/dL (ref 0–99)
NonHDL: 93
Total CHOL/HDL Ratio: 2
Triglycerides: 124 mg/dL (ref 0.0–149.0)
VLDL: 24.8 mg/dL (ref 0.0–40.0)

## 2013-12-20 ENCOUNTER — Encounter: Payer: Self-pay | Admitting: Endocrinology

## 2013-12-20 ENCOUNTER — Ambulatory Visit (INDEPENDENT_AMBULATORY_CARE_PROVIDER_SITE_OTHER): Payer: Medicare Other | Admitting: Endocrinology

## 2013-12-20 VITALS — BP 131/61 | HR 65 | Temp 97.7°F | Resp 14 | Ht 61.0 in | Wt 110.8 lb

## 2013-12-20 DIAGNOSIS — R609 Edema, unspecified: Secondary | ICD-10-CM | POA: Diagnosis not present

## 2013-12-20 DIAGNOSIS — IMO0001 Reserved for inherently not codable concepts without codable children: Secondary | ICD-10-CM | POA: Diagnosis not present

## 2013-12-20 DIAGNOSIS — Z23 Encounter for immunization: Secondary | ICD-10-CM | POA: Diagnosis not present

## 2013-12-20 DIAGNOSIS — E039 Hypothyroidism, unspecified: Secondary | ICD-10-CM | POA: Diagnosis not present

## 2013-12-20 DIAGNOSIS — E1165 Type 2 diabetes mellitus with hyperglycemia: Principal | ICD-10-CM

## 2013-12-20 NOTE — Progress Notes (Signed)
Patient ID: Abigail Hoover, female   DOB: 1946-03-18, 68 y.o.   MRN: [id number]    Reason for Appointment: Diabetes follow-up   History of Present Illness   Diagnosis: Type 2 DIABETES MELITUS, long-standing  She has had mild diabetes for several years but has required multiple drugs for control Usually has had postprandial hyperglycemia, this has been better controlled with adding Glyset to her main meals Has never been on insulin or a GLP-1 drug  Recent history: She has not been seen in followup since 12/14 Although she is not getting any low sugars on her last visit she is now again having periodic blood sugars in the 60s, frequently with symptoms; also had one low reading at 2:30 AM Most of her lower sugars are in the early afternoon, she usually eating smaller portions at lunch She usually is reluctant to reduce her dose of medications for fear of hyperglycemia Her Actos was stopped on her last visit because of tendency to edema Her A1c is significantly better than in 12/14 Glucose monitoring: This is being done relatively sporadically with most readings in the late evening or early morning She still takes Glyset before breakfast and supper without any side effects Again has not done any walking for exercise as discussed   Oral hypoglycemic drugs:   Glyset twice a day, Janumet 50/1000, WelChol       Side effects from medications: None Proper timing of medications in relation to meals: Yes.          Monitors blood glucose:  1 time a day.    Glucometer: One Touch       Blood Glucose readings from meter download:   PREMEAL Breakfast Lunch  3 PM   9 PM  Overall  Glucose range:  77-114   66, 95   63-149   95-132   63-149   Mean/median:     106   95             Meals: 2- 3 meals per day.  eating small breakfast but mostly with carbohydrates    Physical activity: exercise: None             Dietician visit: Most recent: Unknown       Complications: are mild symptoms of neuropathy, no  nephropathy or retinopathy   Wt Readings from Last 3 Encounters:  12/20/13 110 lb 12.8 oz (50.259 kg)  12/09/13 110 lb 3.2 oz (49.986 kg)  09/29/13 108 lb (48.988 kg)   Lab Results  Component Value Date   HGBA1C 6.0 12/15/2013   HGBA1C 7.5* 03/08/2013   HGBA1C 6.3 11/05/2012   Lab Results  Component Value Date   MICROALBUR 1.7 11/05/2012   LDLCALC 68 12/16/2013   CREATININE 1.0 12/15/2013     Appointment on 12/15/2013  Component Date Value Ref Range Status  . Hemoglobin A1C 12/15/2013 6.0  4.6 - 6.5 % Final   Glycemic Control Guidelines for People with Diabetes:Non Diabetic:  <6%Goal of Therapy: <7%Additional Action Suggested:  >8%   . Sodium 12/15/2013 137  135 - 145 mEq/L Final  . Potassium 12/15/2013 4.9  3.5 - 5.1 mEq/L Final  . Chloride 12/15/2013 103  96 - 112 mEq/L Final  . CO2 12/15/2013 23  19 - 32 mEq/L Final  . Glucose, Bld 12/15/2013 129* 70 - 99 mg/dL Final  . BUN 12/15/2013 15  6 - 23 mg/dL Final  . Creatinine, Ser 12/15/2013 1.0  0.4 - 1.2 mg/dL Final  . Calcium 12/15/2013  9.3  8.4 - 10.5 mg/dL Final  . GFR 85/01/82 57.48* >60.00 mL/min Final  . Cholesterol 12/16/2013 157  0 - 200 mg/dL Final   ATP III Classification       Desirable:  < 200 mg/dL               Borderline High:  200 - 239 mg/dL          High:  > = 878 mg/dL  . Triglycerides 12/16/2013 124.0  0.0 - 149.0 mg/dL Final   Normal:  <280 mg/dLBorderline High:  150 - 199 mg/dL  . HDL 12/16/2013 64.00  >39.00 mg/dL Final  . VLDL 76/66/6231 24.8  0.0 - 40.0 mg/dL Final  . LDL Cholesterol 12/16/2013 68  0 - 99 mg/dL Final  . Total CHOL/HDL Ratio 12/16/2013 2   Final                  Men          Women1/2 Average Risk     3.4          3.3Average Risk          5.0          4.42X Average Risk          9.6          7.13X Average Risk          15.0          11.0                      . NonHDL 12/16/2013 93.00   Final   NOTE:  Non-HDL goal should be 30 mg/dL higher than patient's LDL goal (i.e. LDL goal of < 70  mg/dL, would have non-HDL goal of < 100 mg/dL)      Medication List       This list is accurate as of: 12/20/13 11:42 AM.  Always use your most recent med list.               amLODipine 10 MG tablet  Commonly known as:  NORVASC  Take 10 mg by mouth daily.     aspirin 81 MG tablet  Take 81 mg by mouth daily.     Colesevelam HCl 3.75 G Pack  Take by mouth daily.     DEXILANT 60 MG capsule  Generic drug:  dexlansoprazole  Take 60 mg by mouth daily.     furosemide 20 MG tablet  Commonly known as:  LASIX  Take 1 tablet (20 mg total) by mouth daily as needed.     gabapentin 300 MG capsule  Commonly known as:  NEURONTIN  Take 300 mg by mouth 4 (four) times daily.     levothyroxine 88 MCG tablet  Commonly known as:  SYNTHROID  Take 1 tablet (88 mcg total) by mouth daily.     metoprolol succinate 100 MG 24 hr tablet  Commonly known as:  TOPROL-XL  Take 100 mg by mouth 2 (two) times daily. Take with or immediately following a meal.     miglitol 50 MG tablet  Commonly known as:  GLYSET  One tablet by mouth twice a day     MYRBETRIQ 25 MG Tb24 tablet  Generic drug:  mirabegron ER  Take 1 tablet by mouth Daily.     ONE TOUCH ULTRA 2 W/DEVICE Kit  Use to check blood sugars 1 time per day     pentosan polysulfate  100 MG capsule  Commonly known as:  ELMIRON  Take 100 mg by mouth 3 (three) times daily before meals.     PROCTOSOL HC 2.5 % rectal cream  Generic drug:  hydrocortisone     sitaGLIPtin-metformin 50-1000 MG per tablet  Commonly known as:  JANUMET  Take 1 tablet by mouth twice a day     triamcinolone cream 0.1 %  Commonly known as:  KENALOG     Vitamin D3 1000 UNITS Caps  Take 1,000 Units by mouth 2 (two) times daily.        Allergies:  Allergies  Allergen Reactions  . Ezetimibe Itching  . Atorvastatin     REACTION: rash    Past Medical History  Diagnosis Date  . Diabetes mellitus   . Hypertension   . Thyroid disease   . Arthritis   .  Anemia   . GERD (gastroesophageal reflux disease)   . Hypercholesterolemia   . Bladder irritation   . Iron deficiency anemia, unspecified   . Hypothyroidism   . Anxiety   . Psoriasis     Past Surgical History  Procedure Laterality Date  . Abdominal hysterectomy      TAH  . Cervical disc surgery    . Cataract extraction    . Breast biopsy      Left breast biopsy benign lesion  . Colonoscopy  2013    neg. next 2023.   Marland Kitchen Eye surgery    . Breast biopsy  03/11/2012    Procedure: BREAST BIOPSY;  Surgeon: Adolph Pollack, MD;  Location: [facility] OR;  Service: General;  Laterality: Left;  remove left breast mass    Family History  Problem Relation Age of Onset  . Breast cancer Sister   . Cancer Sister     breast cancer  . Heart disease Mother     heart attack  . Stroke Father     brain hemorrhage  . Diabetes Brother   . Cancer Brother     Social History:  reports that she has never smoked. She has never used smokeless tobacco. She reports that she does not drink alcohol or use illicit drugs.  Review of Systems:  HYPERTENSION:  this is well-controlled with half tablet of 10 mg amlodipine along with metoprolol 100 mg, also follows up with cardiologist. Not clear why she is not taking her losartan  HYPERLIPIDEMIA: The lipid abnormality consists of elevated LDL which is mild and well controlled with WelChol.  Lab Results  Component Value Date   CHOL 157 12/16/2013   HDL 64.00 12/16/2013   LDLCALC 68 12/16/2013   TRIG 124.0 12/16/2013   CHOLHDL 2 12/16/2013    She has a long history of hypothyroidism which has been fairly stable and TSH consistently normal with 75 mcg previously.  She takes this on the empty stomach early in the morning and will take her WelChol about 4-5 hours later  She does have occasional numbness in her feet but not in any discomfort and is asking about diabetic shoes  She has had a previous history of hypercalcemia of unclear etiology. Calcium is now  consistently normal  History of iron deficiency anemia; currently not taking iron and was getting  Aranesp   On Gababentin for right arm pain probably carpal tunnel syndrome     Examination:   BP 131/61  Pulse 65  Temp(Src) 97.7 F (36.5 C)  Resp 14  Ht 5\' 1"  (1.549 m)  Wt 110 lb 12.8 oz (50.259  kg)  BMI 20.95 kg/m2  SpO2 99%  Body mass index is 20.95 kg/(m^2).   1-2+ pedal edema present Diabetic foot exam: No decrease in monofilament sensation.  Reduced pulses left foot  ASSESSMENT/ PLAN::   Diabetes type 2   The patient's diabetes control is excellent with her multidrug regimen and her A1c is significantly improved compared to her last visit She is currently on a 4 drug regimen with Januvia, metformin, WelChol and Glyset Currently getting periodic readings in the 60s and again discussed with the patient that we need to reduce her metformin She is checking her blood sugar somewhat randomly in the late evening and overnight but not fasting or after breakfast or lunch Also not motivated to exercise  She will reduce her Janumet to 1 tablet a day and evening only to avoid the low sugars and with the next prescription can take 100/1000 daily Advised her that this should be adequate as she did not seem to have high readings even with reduce doses in the past  Complications: She does not have any evidence of neuropathy and exam and advised her that diabetic shoes will not be covered by Medicare  Edema: This is persistent and only a little better with stopping Actos She has dependent pedal edema which is likely to be from venous insufficiency Currently taking Lasix. She was advised to start wearing elastic stockings   HYPERTENSION: Need to confirm that she is not taking losartan, does need to be on ARB drug  Hyperlipidemia: Excellent control   Hypothyroidism: Need followup levels on her next visit  Influenza vaccine given  To followup more regularly  Counseling time over  50% of today's 25 minute visit  Corrie Reder 12/20/2013, 11:42 AM

## 2013-12-20 NOTE — Patient Instructions (Addendum)
Stop Janumet in am, call when running out  Please check blood sugars at least half the time about 2 hours after any meal and times per week on waking up. Please bring blood sugar monitor to each visit

## 2014-01-12 DIAGNOSIS — R1313 Dysphagia, pharyngeal phase: Secondary | ICD-10-CM | POA: Diagnosis not present

## 2014-01-12 DIAGNOSIS — R070 Pain in throat: Secondary | ICD-10-CM | POA: Diagnosis not present

## 2014-01-17 ENCOUNTER — Telehealth: Payer: Self-pay | Admitting: *Deleted

## 2014-01-17 ENCOUNTER — Other Ambulatory Visit: Payer: Self-pay | Admitting: Otolaryngology

## 2014-01-17 DIAGNOSIS — R1313 Dysphagia, pharyngeal phase: Secondary | ICD-10-CM

## 2014-01-17 DIAGNOSIS — M8580 Other specified disorders of bone density and structure, unspecified site: Secondary | ICD-10-CM | POA: Diagnosis not present

## 2014-01-17 NOTE — Telephone Encounter (Signed)
She can  take the Janumet twice a day but her next prescription will have to be Januvia 100 mg daily and metformin ER 1000 mg daily separately

## 2014-01-17 NOTE — Telephone Encounter (Signed)
Patient called, she said after her last visit you told her to stop taking her Janumet in the morning which she did.  She said since she's stopped it her sugars have been running 199 am, 222 pm and 202, she wanted you to know she has started taking it again in the morning.

## 2014-01-19 ENCOUNTER — Ambulatory Visit
Admission: RE | Admit: 2014-01-19 | Discharge: 2014-01-19 | Disposition: A | Discharge status: Home / Self Care | Payer: Medicare Other | Source: Ambulatory Visit | Attending: Otolaryngology | Admitting: Otolaryngology

## 2014-01-19 DIAGNOSIS — K219 Gastro-esophageal reflux disease without esophagitis: Secondary | ICD-10-CM | POA: Diagnosis not present

## 2014-01-19 DIAGNOSIS — K449 Diaphragmatic hernia without obstruction or gangrene: Secondary | ICD-10-CM | POA: Diagnosis not present

## 2014-01-19 DIAGNOSIS — R1313 Dysphagia, pharyngeal phase: Secondary | ICD-10-CM

## 2014-02-09 ENCOUNTER — Encounter: Payer: Self-pay | Admitting: Neurology

## 2014-02-10 ENCOUNTER — Ambulatory Visit (HOSPITAL_BASED_OUTPATIENT_CLINIC_OR_DEPARTMENT_OTHER): Payer: Medicare Other | Admitting: Hematology and Oncology

## 2014-02-10 ENCOUNTER — Encounter: Payer: Self-pay | Admitting: Hematology and Oncology

## 2014-02-10 ENCOUNTER — Other Ambulatory Visit (HOSPITAL_BASED_OUTPATIENT_CLINIC_OR_DEPARTMENT_OTHER): Payer: Medicare Other

## 2014-02-10 ENCOUNTER — Telehealth: Payer: Self-pay | Admitting: Hematology and Oncology

## 2014-02-10 VITALS — BP 132/50 | HR 66 | Temp 97.4°F | Resp 18 | Ht 61.0 in | Wt 109.5 lb

## 2014-02-10 DIAGNOSIS — D638 Anemia in other chronic diseases classified elsewhere: Secondary | ICD-10-CM

## 2014-02-10 LAB — COMPREHENSIVE METABOLIC PANEL (CC13)
ALT: 9 U/L (ref 0–55)
AST: 15 U/L (ref 5–34)
Albumin: 3.9 g/dL (ref 3.5–5.0)
Alkaline Phosphatase: 56 U/L (ref 40–150)
Anion Gap: 7 mEq/L (ref 3–11)
BUN: 14.4 mg/dL (ref 7.0–26.0)
CO2: 26 mEq/L (ref 22–29)
Calcium: 9.8 mg/dL (ref 8.4–10.4)
Chloride: 104 mEq/L (ref 98–109)
Creatinine: 1 mg/dL (ref 0.6–1.1)
Glucose: 125 mg/dl (ref 70–140)
Potassium: 5.2 mEq/L — ABNORMAL HIGH (ref 3.5–5.1)
Sodium: 137 mEq/L (ref 136–145)
Total Bilirubin: 0.35 mg/dL (ref 0.20–1.20)
Total Protein: 6.3 g/dL — ABNORMAL LOW (ref 6.4–8.3)

## 2014-02-10 LAB — CBC & DIFF AND RETIC
BASO%: 0.2 % (ref 0.0–2.0)
Basophils Absolute: 0 10*3/uL (ref 0.0–0.1)
EOS%: 5.4 % (ref 0.0–7.0)
Eosinophils Absolute: 0.3 10*3/uL (ref 0.0–0.5)
HCT: 32.8 % — ABNORMAL LOW (ref 34.8–46.6)
HGB: 10.3 g/dL — ABNORMAL LOW (ref 11.6–15.9)
Immature Retic Fract: 5 % (ref 1.60–10.00)
LYMPH%: 31.2 % (ref 14.0–49.7)
MCH: 26.5 pg (ref 25.1–34.0)
MCHC: 31.4 g/dL — ABNORMAL LOW (ref 31.5–36.0)
MCV: 84.5 fL (ref 79.5–101.0)
MONO#: 0.4 10*3/uL (ref 0.1–0.9)
MONO%: 6.7 % (ref 0.0–14.0)
NEUT#: 3 10*3/uL (ref 1.5–6.5)
NEUT%: 56.5 % (ref 38.4–76.8)
Platelets: 243 10*3/uL (ref 145–400)
RBC: 3.88 10*6/uL (ref 3.70–5.45)
RDW: 14 % (ref 11.2–14.5)
Retic %: 0.76 % (ref 0.70–2.10)
Retic Ct Abs: 29.49 10*3/uL — ABNORMAL LOW (ref 33.70–90.70)
WBC: 5.2 10*3/uL (ref 3.9–10.3)
lymph#: 1.6 10*3/uL (ref 0.9–3.3)

## 2014-02-10 NOTE — Progress Notes (Signed)
[place] OFFICE PROGRESS NOTE  Abigail Hams, MD SUMMARY OF HEMATOLOGIC HISTORY:  This is a patient that has been followed here 4 years for chronic anemia. In 2011, she had a bone marrow aspirate and biopsy that came back normal. The patient has been receiving Aranesp injection for chronic anemia up until 2014. It was subsequently discontinued due stability of the anemia. INTERVAL HISTORY: Abigail Hoover 69 y.o. female returns for further follow-up. She feels well. Denies symptoms of anemia. The patient denies any recent signs or symptoms of bleeding such as spontaneous epistaxis, hematuria or hematochezia.  I have reviewed the past medical history, past surgical history, social history and family history with the patient and they are unchanged from previous note.  ALLERGIES:  is allergic to ezetimibe and atorvastatin.  MEDICATIONS:  Current Outpatient Prescriptions  Medication Sig Dispense Refill  . amLODipine (NORVASC) 10 MG tablet Take 10 mg by mouth daily.      Marland Kitchen aspirin 81 MG tablet Take 81 mg by mouth daily.      . Blood Glucose Monitoring Suppl (ONE TOUCH ULTRA 2) W/DEVICE KIT Use to check blood sugars 1 time per day 1 each 0  . Cholecalciferol (VITAMIN D3) 1000 UNITS CAPS Take 1,000 Units by mouth 2 (two) times daily.     Marland Kitchen dexlansoprazole (DEXILANT) 60 MG capsule Take 60 mg by mouth daily.      . furosemide (LASIX) 20 MG tablet Take 1 tablet (20 mg total) by mouth daily as needed. 90 tablet 1  . gabapentin (NEURONTIN) 300 MG capsule Take 300 mg by mouth 4 (four) times daily.    Marland Kitchen levothyroxine (SYNTHROID) 88 MCG tablet Take 1 tablet (88 mcg total) by mouth daily. 90 tablet 3  . metoprolol succinate (TOPROL-XL) 100 MG 24 hr tablet Take 100 mg by mouth 2 (two) times daily. Take with or immediately following a meal.    . miglitol (GLYSET) 50 MG tablet One tablet by mouth twice a day 180 tablet 1  . MYRBETRIQ 25 MG TB24 Take 1 tablet by mouth Daily.    .  pentosan polysulfate (ELMIRON) 100 MG capsule Take 100 mg by mouth 3 (three) times daily before meals.     Marland Kitchen PROCTOSOL HC 2.5 % rectal cream     . sitaGLIPtin-metformin (JANUMET) 50-1000 MG per tablet Take 1 tablet by mouth twice a day 180 tablet 1  . triamcinolone cream (KENALOG) 0.1 %     . Colesevelam HCl 3.75 G PACK Take by mouth daily.     No current facility-administered medications for this visit.     REVIEW OF SYSTEMS:   Constitutional: Denies fevers, chills or night sweats Eyes: Denies blurriness of vision Ears, nose, mouth, throat, and face: Denies mucositis or sore throat Respiratory: Denies cough, dyspnea or wheezes Cardiovascular: Denies palpitation, chest discomfort or lower extremity swelling Gastrointestinal:  Denies nausea, heartburn or change in bowel habits Skin: Denies abnormal skin rashes Lymphatics: Denies new lymphadenopathy or easy bruising Neurological:Denies numbness, tingling or new weaknesses Behavioral/Psych: Mood is stable, no new changes  All other systems were reviewed with the patient and are negative.  PHYSICAL EXAMINATION: ECOG PERFORMANCE STATUS: 0 - Asymptomatic  Filed Vitals:   02/10/14 1156  BP: 132/50  Pulse: 66  Temp: 97.4 F (36.3 C)  Resp: 18   Filed Weights   02/10/14 1156  Weight: 109 lb 8 oz (49.669 kg)    GENERAL:alert, no distress and comfortable. She looks thin SKIN: skin color, texture, turgor  are normal, no rashes or significant lesions EYES: normal, Conjunctiva are pink and non-injected, sclera clear Musculoskeletal:no cyanosis of digits and no clubbing  NEURO: alert & oriented x 3 with fluent speech, no focal motor/sensory deficits  LABORATORY DATA:  I have reviewed the data as listed Results for orders placed or performed in visit on 02/10/14 (from the past 48 hour(s))  Comprehensive metabolic panel     Status: Abnormal   Collection Time: 02/10/14 11:36 AM  Result Value Ref Range   Sodium 137 136 - 145 mEq/L    Potassium 5.2 (H) 3.5 - 5.1 mEq/L   Chloride 104 98 - 109 mEq/L   CO2 26 22 - 29 mEq/L   Glucose 125 70 - 140 mg/dl   BUN 14.4 7.0 - 26.0 mg/dL   Creatinine 1.0 0.6 - 1.1 mg/dL   Total Bilirubin 0.35 0.20 - 1.20 mg/dL   Alkaline Phosphatase 56 40 - 150 U/L   AST 15 5 - 34 U/L   ALT 9 0 - 55 U/L   Total Protein 6.3 (L) 6.4 - 8.3 g/dL   Albumin 3.9 3.5 - 5.0 g/dL   Calcium 9.8 8.4 - 10.4 mg/dL   Anion Gap 7 3 - 11 mEq/L  CBC & Diff and Retic     Status: Abnormal   Collection Time: 02/10/14 11:36 AM  Result Value Ref Range   WBC 5.2 3.9 - 10.3 10e3/uL   NEUT# 3.0 1.5 - 6.5 10e3/uL   HGB 10.3 (L) 11.6 - 15.9 g/dL   HCT 32.8 (L) 34.8 - 46.6 %   Platelets 243 145 - 400 10e3/uL   MCV 84.5 79.5 - 101.0 fL   MCH 26.5 25.1 - 34.0 pg   MCHC 31.4 (L) 31.5 - 36.0 g/dL   RBC 3.88 3.70 - 5.45 10e6/uL   RDW 14.0 11.2 - 14.5 %   lymph# 1.6 0.9 - 3.3 10e3/uL   MONO# 0.4 0.1 - 0.9 10e3/uL   Eosinophils Absolute 0.3 0.0 - 0.5 10e3/uL   Basophils Absolute 0.0 0.0 - 0.1 10e3/uL   NEUT% 56.5 38.4 - 76.8 %   LYMPH% 31.2 14.0 - 49.7 %   MONO% 6.7 0.0 - 14.0 %   EOS% 5.4 0.0 - 7.0 %   BASO% 0.2 0.0 - 2.0 %   Retic % 0.76 0.70 - 2.10 %   Retic Ct Abs 29.49 (L) 33.70 - 90.70 10e3/uL   Immature Retic Fract 5.00 1.60 - 10.00 %    Lab Results  Component Value Date   WBC 5.2 02/10/2014   HGB 10.3* 02/10/2014   HCT 32.8* 02/10/2014   MCV 84.5 02/10/2014   PLT 243 02/10/2014    ASSESSMENT & PLAN:  Anemia, chronic disease This is likely anemia of chronic disease. The patient denies recent history of bleeding such as epistaxis, hematuria or hematochezia. She is asymptomatic from the anemia. We will observe for now.  She does not require ESA or further investigation.    All questions were answered. The patient knows to call the clinic with any problems, questions or concerns. No barriers to learning was detected.  I spent 15 minutes counseling the patient face to face. The total time spent in the  appointment was 20 minutes and more than 50% was on counseling.     [place], [place], MD 02/10/2014 1:10 PM

## 2014-02-10 NOTE — Assessment & Plan Note (Signed)
This is likely anemia of chronic disease. The patient denies recent history of bleeding such as epistaxis, hematuria or hematochezia. She is asymptomatic from the anemia. We will observe for now.  She does not require ESA or further investigation.

## 2014-02-10 NOTE — Telephone Encounter (Signed)
s.w pt husband and advised on May 2016 appt....mailed pt appt sched/avs and letter

## 2014-02-15 ENCOUNTER — Encounter: Payer: Self-pay | Admitting: Neurology

## 2014-02-16 ENCOUNTER — Other Ambulatory Visit: Payer: Medicare Other

## 2014-02-16 ENCOUNTER — Other Ambulatory Visit: Payer: Self-pay | Admitting: *Deleted

## 2014-02-16 ENCOUNTER — Telehealth: Payer: Self-pay | Admitting: Endocrinology

## 2014-02-16 DIAGNOSIS — IMO0002 Reserved for concepts with insufficient information to code with codable children: Secondary | ICD-10-CM

## 2014-02-16 DIAGNOSIS — E1165 Type 2 diabetes mellitus with hyperglycemia: Secondary | ICD-10-CM

## 2014-02-16 DIAGNOSIS — E039 Hypothyroidism, unspecified: Secondary | ICD-10-CM

## 2014-02-16 NOTE — Telephone Encounter (Signed)
[place] from [facility] need for you to call her about the lab order patient brought with him. Phone # [phone] Ex [phone]

## 2014-02-18 DIAGNOSIS — E039 Hypothyroidism, unspecified: Secondary | ICD-10-CM | POA: Diagnosis not present

## 2014-02-18 DIAGNOSIS — E1165 Type 2 diabetes mellitus with hyperglycemia: Secondary | ICD-10-CM | POA: Diagnosis not present

## 2014-02-21 ENCOUNTER — Ambulatory Visit (INDEPENDENT_AMBULATORY_CARE_PROVIDER_SITE_OTHER): Payer: Medicare Other | Admitting: Endocrinology

## 2014-02-21 ENCOUNTER — Ambulatory Visit: Payer: Medicare Other | Admitting: Endocrinology

## 2014-02-21 ENCOUNTER — Encounter: Payer: Self-pay | Admitting: Endocrinology

## 2014-02-21 VITALS — BP 116/60 | HR 78 | Temp 98.0°F | Resp 12 | Ht 61.0 in | Wt 108.0 lb

## 2014-02-21 DIAGNOSIS — E119 Type 2 diabetes mellitus without complications: Secondary | ICD-10-CM | POA: Diagnosis not present

## 2014-02-21 DIAGNOSIS — M8580 Other specified disorders of bone density and structure, unspecified site: Secondary | ICD-10-CM

## 2014-02-21 DIAGNOSIS — I10 Essential (primary) hypertension: Secondary | ICD-10-CM | POA: Diagnosis not present

## 2014-02-21 DIAGNOSIS — N3000 Acute cystitis without hematuria: Secondary | ICD-10-CM | POA: Diagnosis not present

## 2014-02-21 DIAGNOSIS — D638 Anemia in other chronic diseases classified elsewhere: Secondary | ICD-10-CM

## 2014-02-21 MED ORDER — IBANDRONATE SODIUM 150 MG PO TABS: ORAL_TABLET | ORAL | Status: DC

## 2014-02-21 MED ORDER — SULFAMETHOXAZOLE-TRIMETHOPRIM 800-160 MG PO TABS: 1.0000 | ORAL_TABLET | Freq: Two times a day (BID) | ORAL | Status: DC

## 2014-02-21 NOTE — Patient Instructions (Addendum)
Glyset 50mg , 1 right at lunch and 1/2 just before dinner  AMLODIPINE 1/2 TAB DAILY  Actonel 1 tab monthly

## 2014-02-21 NOTE — Progress Notes (Signed)
Patient ID: Abigail Hoover, female   DOB: 09-09-1945, 68 y.o.   MRN: [id number]    Reason for Appointment:  follow-up of multiple problems  History of Present Illness   Diagnosis: Type 2 DIABETES MELITUS, long-standing  She has had mild diabetes for several years but has required multiple drugs for control Usually has had postprandial hyperglycemia, this has been better controlled with adding Glyset to her main meals Has never been on insulin or a GLP-1 drug Actos had been stopped previously because of tendency to edema  Recent history: Recently she is not getting any low sugars by history although on her last download she had readings in the 60s periodically She was told to reduce her Janumet to once a day and consider the 100/1000 dose but she thinks her blood sugars were 200 with only one tablet Did not bring her monitor for download However weight is fairly consistent She thinks her lowest blood sugars are couple of hours after her evening meal She still takes Glyset before lunch and supper without any side effects Her A1c is usually near normal and the fructosamine is normal at 239 Again has not done any walking for exercise    Oral hypoglycemic drugs:   Glyset twice a day, Janumet 50/1000, WelChol       Side effects from medications: None Proper timing of medications in relation to meals: Yes.          Monitors blood glucose: 1-2 times a day.    Glucometer: One Touch       Blood Glucose readings from recall: pc about 105, lowest reading about 79, usually checking blood sugars in the mornings and some after meals           Meals: 2- 3 meals per day.  eating small breakfast but mostly with carbohydrates    Physical activity: exercise: None             Dietician visit: Most recent: Unknown       Complications: are mild symptoms of neuropathy, no nephropathy or retinopathy   Wt Readings from Last 3 Encounters:  02/21/14 108 lb (48.988 kg)  02/10/14 109 lb 8 oz (49.669 kg)   12/20/13 110 lb 12.8 oz (50.259 kg)   Lab Results  Component Value Date   HGBA1C 6.0 12/15/2013   HGBA1C 7.5* 03/08/2013   HGBA1C 6.3 11/05/2012   Lab Results  Component Value Date   MICROALBUR 1.7 11/05/2012   LDLCALC 68 12/16/2013   CREATININE 1.0 02/10/2014   Recent creatinine 0.9 Microalbumin/creatinine ratio on 02/18/14 = 37      Medication List       This list is accurate as of: 02/21/14 11:03 AM.  Always use your most recent med list.               amLODipine 10 MG tablet  Commonly known as:  NORVASC  Take 10 mg by mouth daily.     aspirin 81 MG tablet  Take 81 mg by mouth daily.     Colesevelam HCl 3.75 G Pack  Take by mouth daily.     DEXILANT 60 MG capsule  Generic drug:  dexlansoprazole  Take 60 mg by mouth daily.     furosemide 20 MG tablet  Commonly known as:  LASIX  Take 1 tablet (20 mg total) by mouth daily as needed.     gabapentin 300 MG capsule  Commonly known as:  NEURONTIN  Take 300 mg by mouth 4 (four) times  daily.     levothyroxine 88 MCG tablet  Commonly known as:  SYNTHROID  Take 1 tablet (88 mcg total) by mouth daily.     metoprolol succinate 100 MG 24 hr tablet  Commonly known as:  TOPROL-XL  Take 100 mg by mouth 2 (two) times daily. Take with or immediately following a meal.     miglitol 50 MG tablet  Commonly known as:  GLYSET  One tablet by mouth twice a day     MYRBETRIQ 25 MG Tb24 tablet  Generic drug:  mirabegron ER  Take 1 tablet by mouth Daily.     ONE TOUCH ULTRA 2 W/DEVICE Kit  Use to check blood sugars 1 time per day     pentosan polysulfate 100 MG capsule  Commonly known as:  ELMIRON  Take 100 mg by mouth 3 (three) times daily before meals.     PROCTOSOL HC 2.5 % rectal cream  Generic drug:  hydrocortisone     sitaGLIPtin-metformin 50-1000 MG per tablet  Commonly known as:  JANUMET  Take 1 tablet by mouth twice a day     thiamine 100 MG tablet  Commonly known as:  VITAMIN B-1  Take 100 mg by  mouth daily.     triamcinolone cream 0.1 %  Commonly known as:  KENALOG     vitamin B-12 1000 MCG tablet  Commonly known as:  CYANOCOBALAMIN  Take 1,000 mcg by mouth daily.     Vitamin D3 1000 UNITS Caps  Take 1,000 Units by mouth 2 (two) times daily.        Allergies:  Allergies  Allergen Reactions  . Ezetimibe Itching  . Atorvastatin     REACTION: rash    Past Medical History  Diagnosis Date  . Diabetes mellitus   . Hypertension   . Thyroid disease   . Arthritis   . Anemia   . GERD (gastroesophageal reflux disease)   . Hypercholesterolemia   . Bladder irritation   . Iron deficiency anemia, unspecified   . Hypothyroidism   . Anxiety   . Psoriasis     Past Surgical History  Procedure Laterality Date  . Abdominal hysterectomy      TAH  . Cervical disc surgery    . Cataract extraction    . Breast biopsy      Left breast biopsy benign lesion  . Colonoscopy  2013    neg. next 2023.   Marland Kitchen Eye surgery    . Breast biopsy  03/11/2012    Procedure: BREAST BIOPSY;  Surgeon: Odis Hollingshead, MD;  Location: [facility];  Service: General;  Laterality: Left;  remove left breast mass    Family History  Problem Relation Age of Onset  . Breast cancer Sister   . Cancer Sister     breast cancer  . Heart disease Mother     heart attack  . Stroke Father     brain hemorrhage  . Diabetes Brother   . Cancer Brother     Social History:  reports that she has never smoked. She has never used smokeless tobacco. She reports that she does not drink alcohol or use illicit drugs.  Review of Systems:  HYPERTENSION:  this is well-controlled with 10 mg amlodipine along with metoprolol 100 mg, one and a half tablets daily.  She also follows up with cardiologist.  Previously was also taking losartan  HYPERLIPIDEMIA: The lipid abnormality consists of elevated LDL which is mild and well controlled with WelChol.  Lab Results  Component Value Date   CHOL 157 12/16/2013   HDL 64.00  12/16/2013   LDLCALC 68 12/16/2013   TRIG 124.0 12/16/2013   CHOLHDL 2 12/16/2013    She has a long history of hypothyroidism which has been fairly stable and TSH consistently normal with 75 mcg previously.  She takes this on the empty stomach early in the morning and will take her WelChol about 4-5 hours later  TSH in 11/15 is 2.9  She is complaining about some burning with urination and had asked for urinalysis which shows significant pyuria and 2+ leukocytes  She does have occasional numbness in her feet but not in any discomfort and is asking about diabetic shoes  She has had a previous history of hypercalcemia of unclear etiology. Calcium is now consistently normal, recently 9.8  History of iron deficiency anemia; currently not taking iron and was getting  Aranesp and was seen recently by hematologist  On Gababentin for right arm pain probably carpal tunnel syndrome   Diabetic foot exam in 9/15: No decrease in monofilament sensation.  Reduced pulses left foot  OSTEOPENIA: Her T score recently done by gynecologist is -2.1, currently not on any medications   Examination:   BP 116/60 mmHg  Pulse 78  Temp(Src) 98 F (36.7 C)  Resp 12  Ht _0  (1.549 m)  Wt 108 lb (48.988 kg)  BMI 20.42 kg/m2  SpO2 98%  Body mass index is 20.42 kg/(m^2).   No significant pedal edema present  ASSESSMENT/ PLAN::   Diabetes type 2   The patient's diabetes control is looking fairly good with her multidrug regimen although she did not bring her monitor for review Fructosamine is in the lower part of the range and represents good control She is currently on a 4 drug regimen with Januvia, metformin, WelChol and Glyset Although she had a tendency to relatively low sugars with full dose metformin she is doing better now She reports relatively lower readings after her evening meal with taking 50 mg of Glyset before eating  The only changes made today are to reduce her Glyset at suppertime to  half a tablet Reminded her to bring her monitor for download  Mild UTI: Likely to have mild cystitis and will treat her with Septra DS for 5 days, has not had any allergies or renal dysfunction to contraindicate this  Edema: This is persistent and only a little better with stopping Actos She has dependent pedal edema which is likely to be from venous insufficiency Currently taking Lasix. She was advised to start wearing elastic stockings   HYPERTENSION: Blood pressure is low normal and she is taking 10 mg Norvasc.  Will reduce this to half a tablet She does have mild increase in urine microalbumin and will continue to monitor  Although she had a mildly high potassium with her rheumatologist her recent labs show it was 4.7 without change in renal function  OSTEOPENIA: Because for diabetes and age she is at risk for fracture and will start her on Boniva 150 mg monthly.  Consider doing every other year Reclast  Hyperlipidemia: Excellent control   Hypothyroidism: Has normal TSH on today's visit and will continue the same dose  Anemia: She does complain of some fatigue and will repeat her hemoglobin on the next visit  Counseling time over 50% of today's 25 minute visit  Cyann Venti 02/21/2014, 11:03 AM

## 2014-03-06 ENCOUNTER — Other Ambulatory Visit: Payer: Self-pay | Admitting: Endocrinology

## 2014-03-07 ENCOUNTER — Other Ambulatory Visit: Payer: Self-pay | Admitting: *Deleted

## 2014-03-07 DIAGNOSIS — E871 Hypo-osmolality and hyponatremia: Secondary | ICD-10-CM

## 2014-03-07 MED ORDER — METFORMIN HCL ER (MOD) 1000 MG PO TB24: 1000.0000 mg | ORAL_TABLET | Freq: Every day | ORAL | Status: DC

## 2014-03-07 MED ORDER — SITAGLIPTIN PHOSPHATE 100 MG PO TABS: 100.0000 mg | ORAL_TABLET | Freq: Every day | ORAL | Status: DC

## 2014-03-07 MED ORDER — COLESEVELAM HCL 3.75 G PO PACK: PACK | ORAL | Status: DC

## 2014-03-09 DIAGNOSIS — R350 Frequency of micturition: Secondary | ICD-10-CM | POA: Diagnosis not present

## 2014-03-09 DIAGNOSIS — R102 Pelvic and perineal pain: Secondary | ICD-10-CM | POA: Diagnosis not present

## 2014-03-09 DIAGNOSIS — R3915 Urgency of urination: Secondary | ICD-10-CM | POA: Diagnosis not present

## 2014-03-09 DIAGNOSIS — N3010 Interstitial cystitis (chronic) without hematuria: Secondary | ICD-10-CM | POA: Diagnosis not present

## 2014-03-09 DIAGNOSIS — R300 Dysuria: Secondary | ICD-10-CM | POA: Diagnosis not present

## 2014-03-10 DIAGNOSIS — Z111 Encounter for screening for respiratory tuberculosis: Secondary | ICD-10-CM | POA: Diagnosis not present

## 2014-03-11 ENCOUNTER — Telehealth: Payer: Self-pay | Admitting: *Deleted

## 2014-03-11 NOTE — Telephone Encounter (Signed)
Message left on patients vm 

## 2014-03-11 NOTE — Telephone Encounter (Signed)
She can take the Janumet twice a day but her next prescription will have to be Januvia 100 mg daily and metformin ER 1000 mg daily separately            Tora Duck, CMA at 01/17/2014 5:05 PM     Status: Signed       Expand All Collapse All   Patient called, she said after her last visit you told her to stop taking her Janumet in the morning which she did. She said since she's stopped it her sugars have been running 199 am, 222 pm and 202, she wanted you to know she has started taking it again in the morning.       Patient called this morning and wanted to know why you took her off Janumet and put her on Metformin and Januvia?  Please advise

## 2014-03-11 NOTE — Telephone Encounter (Signed)
Needed to reduce the dose of metformin to avoid low sugars

## 2014-03-14 NOTE — Telephone Encounter (Signed)
Patient ask if you would call her. °

## 2014-03-16 ENCOUNTER — Other Ambulatory Visit: Payer: Self-pay | Admitting: *Deleted

## 2014-03-16 MED ORDER — SITAGLIPTIN PHOS-METFORMIN HCL 50-500 MG PO TABS: 1.0000 | ORAL_TABLET | Freq: Two times a day (BID) | ORAL | Status: DC

## 2014-03-31 ENCOUNTER — Encounter: Payer: Self-pay | Admitting: Neurology

## 2014-03-31 ENCOUNTER — Ambulatory Visit (INDEPENDENT_AMBULATORY_CARE_PROVIDER_SITE_OTHER): Payer: Medicare Other | Admitting: Neurology

## 2014-03-31 VITALS — BP 133/64 | HR 69 | Ht 61.0 in | Wt 107.0 lb

## 2014-03-31 DIAGNOSIS — R202 Paresthesia of skin: Secondary | ICD-10-CM

## 2014-03-31 NOTE — Patient Instructions (Signed)
I again had a long discussion with the patient and her husband regarding her carpal tunnel syndrome and peripheral neuropathy and strongly encouraged her to wear wrist extension splints at both wrists and to limit activities with rapid repetitive wrist flexion movements. Continue gabapentin 300 mg 4 times daily for paresthesias. Maintain strict control of diabetes with hemoglobin A1c goal low 6.0. Return for follow-up in 6 months or call earlier if necessary.

## 2014-03-31 NOTE — Progress Notes (Signed)
[facility] [address] [place]. [place] [address] [phone]       OFFICE FOLLOW UP VISIT NOTE  Ms. [name] Date of Birth:  03-15-1946 Medical Record Number:  [id number]   Referring MD:  Mickel Duhamel  Reason for Referral:  Numbness hans and feet  HPI: 18 year [place] lady three-month history of tingling, numbness and burning in her hands and feet. This involves mainly the bottom of her feet and fingertips and hand it is present off and on throughout the day. At times her fingers heart cramped. She has some trouble with fine motor skills by cutting vegetables and occasionally drops objects. She denies any significant problem with balance or walking. She denies significant weakness in the legs. She has tried Lyrica in the past which did not help and stopped it. She is currently on gabapentin 300 mg three times daily which actually she has been taking for 5 years. She has history of diabetes but states his sugars are well controlled and lasti HbA1c 6.7 1 month ago. She also complains of mild short-term memory difficulties which his hand for last several years. 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  She has trouble remembering recent information   but at times but she can remember later.. she is still quite active and independent in activities of daily living and does not need any help. She has history of degenerative cervical spine disease and underwent surgery by Dr. Sherwood Gambler. She did have some paresthesias in her hand prior to the surgery which have been persistent. She denies significant neck pain, radicular pain or trouble with gait or balance . UPDATE 12/09/2013 : She returns for followup of her initial consultation 7 months ago. She is tolerating increased does of bowel content but still has some paresthesias but these are not as bothersome. She continues to complain of discomfort in her hands and in her fingers after using her hands a lot. She admits to doing a lot of  activities with rapid repetitive wrist flexion by cocaine. She did have conduction EMG study done on 04/25/13 by Dr. 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 Peripheral neuropathy panel labs were all normal. I had asked the patient to wear bilateral wrist extension splints but she has not been doing so. She states her diabetes control has been quite good. She has had no new neurological complaints. Cozaar has been discontinued by her primary physician because of low blood pressure Update 03/31/2014 ; She returns for follow-up after last visit 4 months ago. She continues to have mild paresthesias in her hand as well as burning in her feet. She has not been wearing wrist extension splints as instructed. She is also not cut back on activities using her hands for rapid repetitive flexion movements. She continues to have mild gait difficulties but feels her balance is okay and she has not had any falls. She is tolerating gabapentin 300 mg 4 times daily quite well without side effects. She states her diabetes control is quite good and last hemoglobin A1c was 6.0. She has no new complaints today. ROS:   14 system review of systems is positive for , numbness, burning, and  Tingling,memory loss and  all the systems negative  PMH:  Past Medical History  Diagnosis Date  . Diabetes mellitus   . Hypertension   . Thyroid disease   . Arthritis   . Anemia   . GERD (gastroesophageal reflux disease)   .  Hypercholesterolemia   . Bladder irritation   . Iron deficiency anemia, unspecified   . Hypothyroidism   . Anxiety   . Psoriasis     Social History:  History   Social History  . Marital Status: Married    Spouse Name: N/A    Number of Children: 1  . Years of Education: college   Occupational History  . Homemaker    Social History Main Topics  . Smoking status: Never Smoker   . Smokeless tobacco: Never Used  .  Alcohol Use: No  . Drug Use: No  . Sexual Activity: Not on file   Other Topics Concern  . Not on file   Social History Narrative   Patient is married with one child.   Patient is right handed.   Patient has college education.   Patient drinks 1 cup daily.    Medications:   Current Outpatient Prescriptions on File Prior to Visit  Medication Sig Dispense Refill  . amLODipine (NORVASC) 10 MG tablet Take 10 mg by mouth daily.    Marland Kitchen aspirin 81 MG tablet Take 81 mg by mouth daily.      . Blood Glucose Monitoring Suppl (ONE TOUCH ULTRA 2) W/DEVICE KIT Use to check blood sugars 1 time per day 1 each 0  . Cholecalciferol (VITAMIN D3) 1000 UNITS CAPS Take 1,000 Units by mouth 2 (two) times daily.     . Colesevelam HCl 3.75 G PACK Use one packet daily 90 each 1  . dexlansoprazole (DEXILANT) 60 MG capsule Take 60 mg by mouth daily.      . furosemide (LASIX) 20 MG tablet Take 1 tablet (20 mg total) by mouth daily as needed. 90 tablet 1  . gabapentin (NEURONTIN) 300 MG capsule Take 300 mg by mouth 4 (four) times daily.    Marland Kitchen GLYSET 50 MG tablet ONE TABLET BY MOUTH TWICE A DAY 180 tablet 1  . levothyroxine (SYNTHROID) 88 MCG tablet Take 1 tablet (88 mcg total) by mouth daily. 90 tablet 3  . metoprolol succinate (TOPROL-XL) 100 MG 24 hr tablet Take 100 mg by mouth daily. Take with or immediately following a meal. Patient takes 1 1/2 tablets per day    . MYRBETRIQ 25 MG TB24 Take 1 tablet by mouth Daily.    . pentosan polysulfate (ELMIRON) 100 MG capsule Take 100 mg by mouth 3 (three) times daily before meals.     Marland Kitchen PROCTOSOL HC 2.5 % rectal cream     . sitaGLIPtin-metformin (JANUMET) 50-500 MG per tablet Take 1 tablet by mouth 2 (two) times daily with a meal. 90 tablet 1  . triamcinolone cream (KENALOG) 0.1 %     . vitamin B-12 (CYANOCOBALAMIN) 1000 MCG tablet Take 1,000 mcg by mouth daily.     No current facility-administered medications on file prior to visit.    Allergies:   Allergies    Allergen Reactions  . Ezetimibe Itching  . Atorvastatin     REACTION: rash    Physical Exam General: frail elderly [place] Asian lady, seated, in no evident distress Head: head normocephalic and atraumatic.   Neck: supple with no carotid or supraclavicular bruits Cardiovascular: regular rate and rhythm, no murmurs Musculoskeletal: no deformity. Tinel sign negative over both wrists Skin:  no rash/petichiae Vascular:  Normal pulses all extremities Filed Vitals:   03/31/14 1308  BP: 133/64  Pulse: 69    Neurologic Exam Mental Status: Awake and fully alert. Oriented to place and time. Recent and remote memory  intact. Attention span, concentration and fund of knowledge appropriate. Mood and affect appropriate.  Cranial Nerves: Fundoscopic exam not done   Pupils equal, briskly reactive to light. Extraocular movements full without nystagmus. Visual fields full to confrontation. Hearing intact. Facial sensation intact. Face, tongue, palate moves normally and symmetrically.  Motor: Normal bulk and tone. Normal strength in all tested extremity muscles. Sensory.: intact to touch and pinprick sensation but impaired position and  vibratory. Sensation from ankle down bilaterally. Tinel sign negative over both wrists. Coordination: Rapid alternating movements normal in all extremities. Finger-to-nose and heel-to-shin performed accurately bilaterally. Gait and Station: Arises from chair without difficulty. Stance is normal. Gait demonstrates normal stride length and balance . Not able to heel, toe and tandem walk  Reflexes: 1+ and symmetric except ankle jerks absent and right supinator and triceps jerks brisk 2 + bilaterally. Toes downgoing.       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   PLAN:I again had a long discussion with the patient and her  husband regarding her carpal tunnel syndrome and peripheral neuropathy and strongly encouraged her to wear wrist extension splints at both wrists and to limit activities with rapid repetitive wrist flexion movements. Continue gabapentin 300 mg 4 times daily for paresthesias. Maintain strict control of diabetes with hemoglobin A1c goal low 6.0. Return for follow-up in 6 months or call earlier if necessary.   Note: This document was prepared with digital dictation and possible smart phrase technology. Any transcriptional errors that result from this process are unintentional.

## 2014-04-05 DIAGNOSIS — R3915 Urgency of urination: Secondary | ICD-10-CM | POA: Diagnosis not present

## 2014-04-05 DIAGNOSIS — R1031 Right lower quadrant pain: Secondary | ICD-10-CM | POA: Diagnosis not present

## 2014-04-07 ENCOUNTER — Other Ambulatory Visit: Payer: Self-pay | Admitting: *Deleted

## 2014-04-07 MED ORDER — FUROSEMIDE 20 MG PO TABS: 20.0000 mg | ORAL_TABLET | Freq: Every day | ORAL | Status: DC | PRN

## 2014-04-18 ENCOUNTER — Other Ambulatory Visit: Payer: Self-pay | Admitting: *Deleted

## 2014-04-18 DIAGNOSIS — Z9189 Other specified personal risk factors, not elsewhere classified: Secondary | ICD-10-CM | POA: Diagnosis not present

## 2014-04-18 MED ORDER — LEVOTHYROXINE SODIUM 88 MCG PO TABS: 88.0000 ug | ORAL_TABLET | Freq: Every day | ORAL | Status: DC

## 2014-04-19 DIAGNOSIS — R1031 Right lower quadrant pain: Secondary | ICD-10-CM | POA: Diagnosis not present

## 2014-04-19 DIAGNOSIS — R300 Dysuria: Secondary | ICD-10-CM | POA: Diagnosis not present

## 2014-04-19 DIAGNOSIS — N3010 Interstitial cystitis (chronic) without hematuria: Secondary | ICD-10-CM | POA: Diagnosis not present

## 2014-04-19 DIAGNOSIS — R102 Pelvic and perineal pain: Secondary | ICD-10-CM | POA: Diagnosis not present

## 2014-04-19 DIAGNOSIS — R3915 Urgency of urination: Secondary | ICD-10-CM | POA: Diagnosis not present

## 2014-04-19 DIAGNOSIS — R350 Frequency of micturition: Secondary | ICD-10-CM | POA: Diagnosis not present

## 2014-04-19 DIAGNOSIS — R1030 Lower abdominal pain, unspecified: Secondary | ICD-10-CM | POA: Diagnosis not present

## 2014-04-25 DIAGNOSIS — E039 Hypothyroidism, unspecified: Secondary | ICD-10-CM | POA: Diagnosis not present

## 2014-04-25 DIAGNOSIS — I10 Essential (primary) hypertension: Secondary | ICD-10-CM | POA: Diagnosis not present

## 2014-04-25 DIAGNOSIS — M1990 Unspecified osteoarthritis, unspecified site: Secondary | ICD-10-CM | POA: Diagnosis not present

## 2014-04-25 DIAGNOSIS — E559 Vitamin D deficiency, unspecified: Secondary | ICD-10-CM | POA: Diagnosis not present

## 2014-04-25 DIAGNOSIS — E538 Deficiency of other specified B group vitamins: Secondary | ICD-10-CM | POA: Diagnosis not present

## 2014-04-25 DIAGNOSIS — E785 Hyperlipidemia, unspecified: Secondary | ICD-10-CM | POA: Diagnosis not present

## 2014-04-25 DIAGNOSIS — E119 Type 2 diabetes mellitus without complications: Secondary | ICD-10-CM | POA: Diagnosis not present

## 2014-04-25 DIAGNOSIS — I2510 Atherosclerotic heart disease of native coronary artery without angina pectoris: Secondary | ICD-10-CM | POA: Diagnosis not present

## 2014-04-25 DIAGNOSIS — R0609 Other forms of dyspnea: Secondary | ICD-10-CM | POA: Diagnosis not present

## 2014-04-25 DIAGNOSIS — E1140 Type 2 diabetes mellitus with diabetic neuropathy, unspecified: Secondary | ICD-10-CM | POA: Diagnosis not present

## 2014-04-26 ENCOUNTER — Other Ambulatory Visit (HOSPITAL_COMMUNITY): Payer: Self-pay | Admitting: Cardiology

## 2014-04-26 DIAGNOSIS — R079 Chest pain, unspecified: Secondary | ICD-10-CM

## 2014-05-02 ENCOUNTER — Telehealth: Payer: Self-pay | Admitting: *Deleted

## 2014-05-02 NOTE — Telephone Encounter (Signed)
Spoke with patient states that she is having a lot of pain in her hands, suggested that patient go to either ER or Urgent care, patient refused and requested appointment, gave patient first available with NP MM, patient refused only wants to see Dr Leonie Man, informed her that at this time Dr Leonie Man does not have any openings until May 2016, patient requested that a message be sent to Dr Leonie Man. Patient states that she still takes the Gabapentin as prescribed.

## 2014-05-02 NOTE — Telephone Encounter (Signed)
Patient calling having severe hand pain and would like a call back. Please advise.

## 2014-05-03 ENCOUNTER — Other Ambulatory Visit: Payer: Self-pay | Admitting: Neurology

## 2014-05-03 DIAGNOSIS — G5601 Carpal tunnel syndrome, right upper limb: Secondary | ICD-10-CM

## 2014-05-03 DIAGNOSIS — N390 Urinary tract infection, site not specified: Secondary | ICD-10-CM | POA: Diagnosis not present

## 2014-05-03 DIAGNOSIS — R300 Dysuria: Secondary | ICD-10-CM | POA: Diagnosis not present

## 2014-05-03 NOTE — Telephone Encounter (Signed)
I spoke to the patient and she is having persistent right hand pain from carpal tunnel despite wearing wrist splint and she cannot cut back on her hand activities hence I am going to refer the patient to hand surgeon for right wrist carpal tunnel release surgery. Referral placed in Epic

## 2014-05-04 ENCOUNTER — Encounter (HOSPITAL_COMMUNITY)
Admission: RE | Admit: 2014-05-04 | Discharge: 2014-05-04 | Disposition: A | Discharge status: Home / Self Care | Payer: Medicare Other | Source: Ambulatory Visit | Attending: Cardiology | Admitting: Cardiology

## 2014-05-04 ENCOUNTER — Encounter (HOSPITAL_COMMUNITY): Payer: Medicare Other

## 2014-05-04 ENCOUNTER — Encounter (HOSPITAL_COMMUNITY): Admission: RE | Admit: 2014-05-04 | Payer: Medicare Other | Source: Ambulatory Visit

## 2014-05-04 DIAGNOSIS — I2510 Atherosclerotic heart disease of native coronary artery without angina pectoris: Secondary | ICD-10-CM | POA: Diagnosis not present

## 2014-05-04 DIAGNOSIS — E119 Type 2 diabetes mellitus without complications: Secondary | ICD-10-CM | POA: Diagnosis not present

## 2014-05-04 DIAGNOSIS — R0609 Other forms of dyspnea: Secondary | ICD-10-CM | POA: Diagnosis not present

## 2014-05-04 DIAGNOSIS — R079 Chest pain, unspecified: Secondary | ICD-10-CM | POA: Insufficient documentation

## 2014-05-04 DIAGNOSIS — I10 Essential (primary) hypertension: Secondary | ICD-10-CM | POA: Diagnosis not present

## 2014-05-04 MED ORDER — REGADENOSON 0.4 MG/5ML IV SOLN
INTRAVENOUS | Status: AC
  Filled 2014-05-04: qty 5

## 2014-05-04 MED ORDER — REGADENOSON 0.4 MG/5ML IV SOLN
0.4000 mg | Freq: Once | INTRAVENOUS | Status: AC
  Administered 2014-05-04: 0.4 mg via INTRAVENOUS

## 2014-05-04 MED ORDER — TECHNETIUM TC 99M SESTAMIBI GENERIC - CARDIOLITE
30.0000 | Freq: Once | INTRAVENOUS | Status: AC | PRN
  Administered 2014-05-04: 30 via INTRAVENOUS

## 2014-05-04 MED ORDER — TECHNETIUM TC 99M SESTAMIBI GENERIC - CARDIOLITE
10.0000 | Freq: Once | INTRAVENOUS | Status: AC | PRN
  Administered 2014-05-04: 10 via INTRAVENOUS

## 2014-05-13 ENCOUNTER — Other Ambulatory Visit: Payer: Self-pay | Admitting: *Deleted

## 2014-05-13 ENCOUNTER — Other Ambulatory Visit (INDEPENDENT_AMBULATORY_CARE_PROVIDER_SITE_OTHER): Payer: Medicare Other

## 2014-05-13 DIAGNOSIS — D638 Anemia in other chronic diseases classified elsewhere: Secondary | ICD-10-CM

## 2014-05-13 DIAGNOSIS — E119 Type 2 diabetes mellitus without complications: Secondary | ICD-10-CM

## 2014-05-13 LAB — COMPREHENSIVE METABOLIC PANEL
ALT: 29 U/L (ref 0–35)
AST: 24 U/L (ref 0–37)
Albumin: 3.8 g/dL (ref 3.5–5.2)
Alkaline Phosphatase: 65 U/L (ref 39–117)
BUN: 17 mg/dL (ref 6–23)
CO2: 30 mEq/L (ref 19–32)
Calcium: 9.4 mg/dL (ref 8.4–10.5)
Chloride: 104 mEq/L (ref 96–112)
Creatinine, Ser: 0.95 mg/dL (ref 0.40–1.20)
GFR: 61.62 mL/min (ref >60.00)
Glucose, Bld: 180 mg/dL — ABNORMAL HIGH (ref 70–99)
Potassium: 4.7 mEq/L (ref 3.5–5.1)
Sodium: 136 mEq/L (ref 135–145)
Total Bilirubin: 0.4 mg/dL (ref 0.2–1.2)
Total Protein: 6 g/dL (ref 6.0–8.3)

## 2014-05-13 LAB — CBC
HCT: 30.4 % — ABNORMAL LOW (ref 36.0–46.0)
Hemoglobin: 9.8 g/dL — ABNORMAL LOW (ref 12.0–15.0)
MCHC: 32.1 g/dL (ref 30.0–36.0)
MCV: 83.4 fl (ref 78.0–100.0)
Platelets: 221 10*3/uL (ref 150.0–400.0)
RBC: 3.65 Mil/uL — ABNORMAL LOW (ref 3.87–5.11)
RDW: 14.8 % (ref 11.5–15.5)
WBC: 5.8 10*3/uL (ref 4.0–10.5)

## 2014-05-13 LAB — URINALYSIS, ROUTINE W REFLEX MICROSCOPIC
Bilirubin Urine: NEGATIVE
Hgb urine dipstick: NEGATIVE
Ketones, ur: NEGATIVE
Leukocytes, UA: NEGATIVE
Nitrite: NEGATIVE
Specific Gravity, Urine: 1.02 (ref 1.000–1.030)
Total Protein, Urine: NEGATIVE
Urine Glucose: NEGATIVE
Urobilinogen, UA: 0.2 (ref 0.0–1.0)
pH: 5.5 (ref 5.0–8.0)

## 2014-05-13 LAB — HEMOGLOBIN A1C: Hgb A1c MFr Bld: 6.3 % (ref 4.6–6.5)

## 2014-05-13 LAB — LIPID PANEL
Cholesterol: 149 mg/dL (ref 0–200)
HDL: 72.8 mg/dL (ref >39.00)
LDL Cholesterol: 61 mg/dL (ref 0–99)
NonHDL: 76.2
Total CHOL/HDL Ratio: 2
Triglycerides: 76 mg/dL (ref 0.0–149.0)
VLDL: 15.2 mg/dL (ref 0.0–40.0)

## 2014-05-16 ENCOUNTER — Encounter: Payer: Self-pay | Admitting: Endocrinology

## 2014-05-16 ENCOUNTER — Ambulatory Visit (INDEPENDENT_AMBULATORY_CARE_PROVIDER_SITE_OTHER): Payer: Medicare Other | Admitting: Endocrinology

## 2014-05-16 ENCOUNTER — Telehealth: Payer: Self-pay | Admitting: Hematology and Oncology

## 2014-05-16 ENCOUNTER — Telehealth: Payer: Self-pay | Admitting: *Deleted

## 2014-05-16 ENCOUNTER — Other Ambulatory Visit: Payer: Self-pay | Admitting: Hematology and Oncology

## 2014-05-16 VITALS — BP 140/60 | HR 61 | Temp 98.2°F | Resp 12 | Ht 61.0 in | Wt 111.0 lb

## 2014-05-16 DIAGNOSIS — E1140 Type 2 diabetes mellitus with diabetic neuropathy, unspecified: Secondary | ICD-10-CM

## 2014-05-16 DIAGNOSIS — E119 Type 2 diabetes mellitus without complications: Secondary | ICD-10-CM

## 2014-05-16 DIAGNOSIS — D638 Anemia in other chronic diseases classified elsewhere: Secondary | ICD-10-CM

## 2014-05-16 DIAGNOSIS — M25561 Pain in right knee: Secondary | ICD-10-CM

## 2014-05-16 MED ORDER — GABAPENTIN 600 MG PO TABS: 600.0000 mg | ORAL_TABLET | Freq: Three times a day (TID) | ORAL | Status: DC

## 2014-05-16 NOTE — Telephone Encounter (Signed)
I placed POF for labs and see her next week

## 2014-05-16 NOTE — Progress Notes (Signed)
Patient ID: Abigail Hoover, female   DOB: 08/11/1945, 69 y.o.   MRN: [id number]    Reason for Appointment:  follow-up of multiple problems  History of Present Illness   Diagnosis: Type 2 DIABETES MELITUS, long-standing  She has had mild diabetes for several years but has required multiple drugs for control Usually has had postprandial hyperglycemia, this has been better controlled with adding Glyset to her main meals Has never been on insulin or a GLP-1 drug Actos had been stopped previously because of tendency to edema  Recent history: Her A1c is still in the upper normal range She is now on a reduced dose of metformin because of some tendency to low sugars with higher doses She does not check blood sugars consistently after her breakfast or lunch and occasionally will have higher readings in the evenings Her carbohydrate intake is variable and sometimes will have unbalanced meals Also blood sugar after breakfast in the lab was 180  She had a glucose of 66 but was asymptomatic, she thinks that when she has low sugars she will feel sweaty and had some malaise Weight has gone up slightly although she has tendency to edema at times Again has not done any walking for exercise despite reminders, does not like to go outside to walk   Oral hypoglycemic drugs:   Glyset twice a day, Janumet 50/ 500 twice a day, WelChol       Side effects from medications: None Proper timing of medications in relation to meals: Yes.          Monitors blood glucose: 1-2 times a day.    Glucometer: One Touch       Blood Glucose readings from download: Is checking readings mostly after 9 PM  PRE-MEAL Breakfast Lunch Dinner Bedtime Overall  Glucose range:  83-129   163   102-161   118-193    Median:  120     161   119    Meals: 2- 3 meals per day.  eating small breakfast but mostly with carbohydrates    Physical activity: exercise: None             Dietician visit: Most recent: Unknown       Complications: are  mild symptoms of neuropathy, no nephropathy or retinopathy   Wt Readings from Last 3 Encounters:  05/16/14 111 lb (50.349 kg)  03/31/14 107 lb (48.535 kg)  02/21/14 108 lb (48.988 kg)   Lab Results  Component Value Date   HGBA1C 6.3 05/13/2014   HGBA1C 6.0 12/15/2013   HGBA1C 7.5* 03/08/2013   Lab Results  Component Value Date   MICROALBUR 1.7 11/05/2012   LDLCALC 61 05/13/2014   CREATININE 0.95 05/13/2014    Microalbumin/creatinine ratio on 02/18/14 = 37  Appointment on 05/13/2014  Component Date Value Ref Range Status  . Hgb A1c MFr Bld 05/13/2014 6.3  4.6 - 6.5 % Final   Glycemic Control Guidelines for People with Diabetes:Non Diabetic:  <6%Goal of Therapy: <7%Additional Action Suggested:  >8%   . Sodium 05/13/2014 136  135 - 145 mEq/L Final  . Potassium 05/13/2014 4.7  3.5 - 5.1 mEq/L Final  . Chloride 05/13/2014 104  96 - 112 mEq/L Final  . CO2 05/13/2014 30  19 - 32 mEq/L Final  . Glucose, Bld 05/13/2014 180* 70 - 99 mg/dL Final  . BUN 05/13/2014 17  6 - 23 mg/dL Final  . Creatinine, Ser 05/13/2014 0.95  0.40 - 1.20 mg/dL Final  . Total Bilirubin  05/13/2014 0.4  0.2 - 1.2 mg/dL Final  . Alkaline Phosphatase 05/13/2014 65  39 - 117 U/L Final  . AST 05/13/2014 24  0 - 37 U/L Final  . ALT 05/13/2014 29  0 - 35 U/L Final  . Total Protein 05/13/2014 6.0  6.0 - 8.3 g/dL Final  . Albumin 05/13/2014 3.8  3.5 - 5.2 g/dL Final  . Calcium 05/13/2014 9.4  8.4 - 10.5 mg/dL Final  . GFR 05/13/2014 61.62  >60.00 mL/min Final  . Cholesterol 05/13/2014 149  0 - 200 mg/dL Final   ATP III Classification       Desirable:  < 200 mg/dL               Borderline High:  200 - 239 mg/dL          High:  > = 240 mg/dL  . Triglycerides 05/13/2014 76.0  0.0 - 149.0 mg/dL Final   Normal:  <150 mg/dLBorderline High:  150 - 199 mg/dL  . HDL 05/13/2014 72.80  >39.00 mg/dL Final  . VLDL 05/13/2014 15.2  0.0 - 40.0 mg/dL Final  . LDL Cholesterol 05/13/2014 61  0 - 99 mg/dL Final  . Total CHOL/HDL  Ratio 05/13/2014 2   Final                  Men          Women1/2 Average Risk     3.4          3.3Average Risk          5.0          4.42X Average Risk          9.6          7.13X Average Risk          15.0          11.0                      . NonHDL 05/13/2014 76.20   Final   NOTE:  Non-HDL goal should be 30 mg/dL higher than patient's LDL goal (i.e. LDL goal of < 70 mg/dL, would have non-HDL goal of < 100 mg/dL)  . Color, Urine 05/13/2014 YELLOW  Yellow;Lt. Yellow Final  . APPearance 05/13/2014 CLEAR  Clear Final  . Specific Gravity, Urine 05/13/2014 1.020  1.000-1.030 Final  . pH 05/13/2014 5.5  5.0 - 8.0 Final  . Total Protein, Urine 05/13/2014 NEGATIVE  Negative Final  . Urine Glucose 05/13/2014 NEGATIVE  Negative Final  . Ketones, ur 05/13/2014 NEGATIVE  Negative Final  . Bilirubin Urine 05/13/2014 NEGATIVE  Negative Final  . Hgb urine dipstick 05/13/2014 NEGATIVE  Negative Final  . Urobilinogen, UA 05/13/2014 0.2  0.0 - 1.0 Final  . Leukocytes, UA 05/13/2014 NEGATIVE  Negative Final  . Nitrite 05/13/2014 NEGATIVE  Negative Final  . WBC, UA 05/13/2014 0-2/hpf  0-2/hpf Final  . RBC / HPF 05/13/2014 0-2/hpf  0-2/hpf Final  . Squamous Epithelial / LPF 05/13/2014 Rare(0-4/hpf)  Rare(0-4/hpf) Final  . WBC 05/13/2014 5.8  4.0 - 10.5 K/uL Final  . RBC 05/13/2014 3.65* 3.87 - 5.11 Mil/uL Final  . Platelets 05/13/2014 221.0  150.0 - 400.0 K/uL Final  . Hemoglobin 05/13/2014 9.8* 12.0 - 15.0 g/dL Final  . HCT 05/13/2014 30.4* 36.0 - 46.0 % Final  . MCV 05/13/2014 83.4  78.0 - 100.0 fl Final  . MCHC 05/13/2014 32.1  30.0 - 36.0 g/dL Final  .  RDW 05/13/2014 14.8  11.5 - 15.5 % Final         Medication List       This list is accurate as of: 05/16/14  9:23 AM.  Always use your most recent med list.               amLODipine 10 MG tablet  Commonly known as:  NORVASC  Take 10 mg by mouth daily.     aspirin 81 MG tablet  Take 81 mg by mouth daily.     CARAFATE 1 GM/10ML  suspension  Generic drug:  sucralfate     Colesevelam HCl 3.75 G Pack  Use one packet daily     DEXILANT 60 MG capsule  Generic drug:  dexlansoprazole  Take 60 mg by mouth daily.     furosemide 20 MG tablet  Commonly known as:  LASIX  Take 1 tablet (20 mg total) by mouth daily as needed.     gabapentin 300 MG capsule  Commonly known as:  NEURONTIN  Take 300 mg by mouth 4 (four) times daily.     GLYSET 50 MG tablet  Generic drug:  miglitol  ONE TABLET BY MOUTH TWICE A DAY     ibandronate 150 MG tablet  Commonly known as:  BONIVA     levothyroxine 88 MCG tablet  Commonly known as:  SYNTHROID, LEVOTHROID  Take 1 tablet (88 mcg total) by mouth daily.     metoprolol succinate 100 MG 24 hr tablet  Commonly known as:  TOPROL-XL  Take 100 mg by mouth daily. Take with or immediately following a meal. Patient takes 1 1/2 tablets per day     MYRBETRIQ 25 MG Tb24 tablet  Generic drug:  mirabegron ER  Take 1 tablet by mouth Daily.     ONE TOUCH ULTRA 2 W/DEVICE Kit  Use to check blood sugars 1 time per day     pentosan polysulfate 100 MG capsule  Commonly known as:  ELMIRON  Take 100 mg by mouth 3 (three) times daily before meals.     PROCTOSOL HC 2.5 % rectal cream  Generic drug:  hydrocortisone     sitaGLIPtin-metformin 50-500 MG per tablet  Commonly known as:  JANUMET  Take 1 tablet by mouth 2 (two) times daily with a meal.     triamcinolone cream 0.1 %  Commonly known as:  KENALOG     URELLE 81 MG Tabs tablet     vitamin B-12 1000 MCG tablet  Commonly known as:  CYANOCOBALAMIN  Take 1,000 mcg by mouth daily.     Vitamin D3 1000 UNITS Caps  Take 1,000 Units by mouth 2 (two) times daily.        Allergies:  Allergies  Allergen Reactions  . Ezetimibe Itching  . Atorvastatin     REACTION: rash    Past Medical History  Diagnosis Date  . Diabetes mellitus   . Hypertension   . Thyroid disease   . Arthritis   . Anemia   . GERD (gastroesophageal reflux  disease)   . Hypercholesterolemia   . Bladder irritation   . Iron deficiency anemia, unspecified   . Hypothyroidism   . Anxiety   . Psoriasis     Past Surgical History  Procedure Laterality Date  . Abdominal hysterectomy      TAH  . Cervical disc surgery    . Cataract extraction    . Breast biopsy      Left breast biopsy benign  lesion  . Colonoscopy  2013    neg. next 2023.   Marland Kitchen Eye surgery    . Breast biopsy  03/11/2012    Procedure: BREAST BIOPSY;  Surgeon: Adolph Pollack, MD;  Location: [facility] OR;  Service: General;  Laterality: Left;  remove left breast mass    Family History  Problem Relation Age of Onset  . Breast cancer Sister   . Cancer Sister     breast cancer  . Heart disease Mother     heart attack  . Stroke Father     brain hemorrhage  . Diabetes Brother   . Cancer Brother     Social History:  reports that she has never smoked. She has never used smokeless tobacco. She reports that she does not drink alcohol or use illicit drugs.  Review of Systems:  HYPERTENSION:  this is well-controlled with 5 mg amlodipine along with metoprolol 100 mg, one and a half tablets daily.   She has had recent follow-up with cardiologist.  Previously was also taking losartan, not on ACE inhibitor either  HYPERLIPIDEMIA: The lipid abnormality consists of elevated LDL which is mild and well controlled with WelChol alone.  Lab Results  Component Value Date   CHOL 149 05/13/2014   HDL 72.80 05/13/2014   LDLCALC 61 05/13/2014   TRIG 76.0 05/13/2014   CHOLHDL 2 05/13/2014    She has a long history of primary hypothyroidism which has been fairly stable and TSH consistently normal with 75 mcg previously.  She takes this on an empty stomach early in the morning and will take her WelChol about 4-5 hours later  TSH has been stable  Lab Results  Component Value Date   TSH 2.220 04/26/2013   TSH 3.42 03/08/2013   TSH 3.03 11/05/2012   FREET4 1.38 03/08/2013   FREET4 1.34  11/05/2012    NEUROPATHY:  She does have occasional numbness in her feet but now complaining of some burning sensation on the bottom. Also has some pain which is more of aching sensation in her right leg, somewhat more around the ankle, not worse on walking.  Has not discussed with her physicians yet She thinks she is taking gabapentin from the urologist and not for neuropathy; also previously having possible carpal tunnel syndrome  She has had a previous history of hypercalcemia of unclear etiology. Calcium is now consistently normal   History of iron deficiency anemia; currently not taking iron and was getting  Aranesp and has not had a follow-up scheduled until June but her hemoglobin is trending lower  Lab Results  Component Value Date   WBC 5.8 05/13/2014   HGB 9.8* 05/13/2014   HCT 30.4* 05/13/2014   MCV 83.4 05/13/2014   PLT 221.0 05/13/2014       Diabetic foot exam in 9/15: No decrease in monofilament sensation.  Reduced pulses left foot  OSTEOPENIA: Her T score  done by gynecologist was -2.1, currently on Boniva started in 01/2014 and tolerating it well   Examination:   BP 132/99 mmHg  Pulse 61  Temp(Src) 98.2 F (36.8 C)  Resp 12  Ht 5\' 1"  (1.549 m)  Wt 111 lb (50.349 kg)  BMI 20.98 kg/m2  SpO2 97%  Body mass index is 20.98 kg/(m^2).   Repeat blood pressure 140/60  Trace pedal edema present especially on the right Has mild tenderness of the right ankle on movement  ASSESSMENT/ PLAN:   Diabetes type 2   The patient's diabetes control is  fairly good with her multidrug regimen although she does have somewhat higher readings after evening meal but usually under 200 A1c is still upper normal and she is compliant with her medication With reducing her Glyset at suppertime to half a tablet and the dose of metformin to 500 mg twice a day she is not having low blood sugars Encouraged her to start walking and do more readings after meals as well as have balanced  meals with more protein instead of carbohydrate Otherwise she will continue her 4 drug regimen  NEUROPATHY: She has some neuropathic symptoms now but not clear why she is having pain in her right leg Will increase her gabapentin to 600 mg 3 times a day She will also discuss evaluation of her leg pain with PCP as it does not appear to be typical neuropathy  Edema: This is relatively mild now, encouraged using elastic stockings  HYPERTENSION: Blood pressure is  normal and she is taking 5 mg Norvasc.  She does have a blood pressure monitor at home but does not like to check it.  Needs to start doing this periodically She does have mild increase in urine microalbumin and will consider adding losartan again  HYPOKALEMIA: This has resolved  Hypothyroidism: Has normal TSH on current dose of 75 g  Anemia: She does complain of some fatigue and will have her see hematologist for hemoglobin of 9.8  Counseling time over 50% of today's 25 minute visit  Marques Ericson 05/16/2014, 9:23 AM

## 2014-05-16 NOTE — Telephone Encounter (Signed)
She does not need earlier appointment because based on recent blood work she does not need transfusion. If she insists, she can see a nurse practitioner because I'm fully booked until then

## 2014-05-16 NOTE — Telephone Encounter (Signed)
CALLED AND SPOKE TO PT.'S HUSBAND. HE SAID PT. IS VERY TIRED AND GETS SHAKY. PT.'S HUSBAND WOULD LIKE HIS WIFE SEEN SOONER.

## 2014-05-16 NOTE — Telephone Encounter (Signed)
returned call and s.w. pt and fwd to triage

## 2014-05-16 NOTE — Patient Instructions (Addendum)
Gabapentin 2 tablets, 3 times daily  Please check blood sugars at least half the time about 2 hours after any meal and 3 times per week on waking up. Please bring blood sugar monitor to each visit. Recommended blood sugar levels about 2 hours after meal is 140-180 and on waking up 90-130  See hematologist  Walk daily

## 2014-05-16 NOTE — Telephone Encounter (Signed)
Called patient spoke with spouse.  Informed him we are monitoring her closely.  The May 27, 2014 appointment is enough time to re-check labs to evaluate the recent change in hgb.  Assured him she does not need transfusion at this time.  He asked for explanation why previously with Dr. Jamse Arn she received an aranesp injection every three weeks for hgb <11 and she always felt good with with this schedule.  Asked what time he wanted to bring her in tomorrow for evaluation.  "If you want her to come in let me know.  I just want to make sure she will be all right to wait until March 4th.  Advise given is she is stable with hgb being in the range of ten for the past year with Friday being the first decrease in hgb from 10.2 to 9.8.  She is fine but by all means to call if any changes in her symptoms.  Verbalized understanding and no request for appointment changes.

## 2014-05-16 NOTE — Telephone Encounter (Signed)
s.w. pt and advised on march appt....pt ok and aware °

## 2014-05-16 NOTE — Telephone Encounter (Signed)
Spouse reports "Dr. Dwyane Dee instructed them to call Hematologist/Oncologist immediately due to hgb being very low.  HGB = 9.8 on 05-13-2014.  She is not as active the past two to three weeks.  Gets tired easily."  Denies s.o.b., chest pain, bleeding or bruising.  Next scheduled F/U 08-12-2014 with last visit on 02-10-2014.

## 2014-05-18 DIAGNOSIS — M25532 Pain in left wrist: Secondary | ICD-10-CM | POA: Diagnosis not present

## 2014-05-18 DIAGNOSIS — E1142 Type 2 diabetes mellitus with diabetic polyneuropathy: Secondary | ICD-10-CM | POA: Diagnosis not present

## 2014-05-18 DIAGNOSIS — M25531 Pain in right wrist: Secondary | ICD-10-CM | POA: Diagnosis not present

## 2014-05-27 ENCOUNTER — Ambulatory Visit (HOSPITAL_BASED_OUTPATIENT_CLINIC_OR_DEPARTMENT_OTHER): Payer: Medicare Other

## 2014-05-27 ENCOUNTER — Other Ambulatory Visit: Payer: Self-pay | Admitting: Hematology and Oncology

## 2014-05-27 ENCOUNTER — Encounter: Payer: Self-pay | Admitting: Hematology and Oncology

## 2014-05-27 ENCOUNTER — Telehealth: Payer: Self-pay | Admitting: Hematology and Oncology

## 2014-05-27 ENCOUNTER — Ambulatory Visit (HOSPITAL_BASED_OUTPATIENT_CLINIC_OR_DEPARTMENT_OTHER): Payer: Medicare Other | Admitting: Hematology and Oncology

## 2014-05-27 ENCOUNTER — Other Ambulatory Visit (HOSPITAL_BASED_OUTPATIENT_CLINIC_OR_DEPARTMENT_OTHER): Payer: Medicare Other

## 2014-05-27 VITALS — BP 154/47 | HR 66 | Temp 97.7°F | Resp 17 | Ht 61.0 in | Wt 113.0 lb

## 2014-05-27 DIAGNOSIS — D638 Anemia in other chronic diseases classified elsewhere: Secondary | ICD-10-CM

## 2014-05-27 DIAGNOSIS — D539 Nutritional anemia, unspecified: Secondary | ICD-10-CM

## 2014-05-27 DIAGNOSIS — I10 Essential (primary) hypertension: Secondary | ICD-10-CM | POA: Diagnosis not present

## 2014-05-27 LAB — CBC & DIFF AND RETIC
BASO%: 0.3 % (ref 0.0–2.0)
Basophils Absolute: 0 10*3/uL (ref 0.0–0.1)
EOS%: 8.8 % — ABNORMAL HIGH (ref 0.0–7.0)
Eosinophils Absolute: 0.6 10*3/uL — ABNORMAL HIGH (ref 0.0–0.5)
HCT: 31.4 % — ABNORMAL LOW (ref 34.8–46.6)
HGB: 9.6 g/dL — ABNORMAL LOW (ref 11.6–15.9)
Immature Retic Fract: 6 % (ref 1.60–10.00)
LYMPH%: 24.5 % (ref 14.0–49.7)
MCH: 26.5 pg (ref 25.1–34.0)
MCHC: 30.6 g/dL — ABNORMAL LOW (ref 31.5–36.0)
MCV: 86.7 fL (ref 79.5–101.0)
MONO#: 0.4 10*3/uL (ref 0.1–0.9)
MONO%: 6.3 % (ref 0.0–14.0)
NEUT#: 3.8 10*3/uL (ref 1.5–6.5)
NEUT%: 60.1 % (ref 38.4–76.8)
Platelets: 257 10*3/uL (ref 145–400)
RBC: 3.62 10*6/uL — ABNORMAL LOW (ref 3.70–5.45)
RDW: 14.2 % (ref 11.2–14.5)
Retic %: 1.12 % (ref 0.70–2.10)
Retic Ct Abs: 40.54 10*3/uL (ref 33.70–90.70)
WBC: 6.4 10*3/uL (ref 3.9–10.3)
lymph#: 1.6 10*3/uL (ref 0.9–3.3)
nRBC: 0 % (ref 0–0)

## 2014-05-27 LAB — HOLD TUBE, BLOOD BANK

## 2014-05-27 LAB — SEDIMENTATION RATE: Sed Rate: 13 mm/hr (ref 0–30)

## 2014-05-27 MED ORDER — DARBEPOETIN ALFA 200 MCG/0.4ML IJ SOSY
200.0000 ug | PREFILLED_SYRINGE | Freq: Once | INTRAMUSCULAR | Status: AC
  Administered 2014-05-27: 200 ug via SUBCUTANEOUS
  Filled 2014-05-27: qty 0.4

## 2014-05-27 NOTE — Assessment & Plan Note (Signed)
This is likely anemia of chronic disease. The patient denies recent history of bleeding such as epistaxis, hematuria or hematochezia.  I will reorder serum vitamin B-12 and iron studies. Her last EPO level was low and I think she might benefit from Aranesp injection.

## 2014-05-27 NOTE — Assessment & Plan Note (Signed)
She has significant fatigue. In the past, she had responded to Aranesp injection. I discontinued her injection when her hemoglobin was greater than 10. Since we discontinued that, she had progressive fatigue, shortness of breath and dizziness. She is getting progressively anemic. She requested resumption of Aranesp injection. We discussed the risk, benefit, side effects or Aranesp injection including risk of thrombosis and headaches. She agreed to proceed. We will give her 200 g every 3 weeks with goal to keep hemoglobin greater than 11.

## 2014-05-27 NOTE — Progress Notes (Signed)
[facility] OFFICE PROGRESS NOTE  [name], MD SUMMARY OF HEMATOLOGIC HISTORY:  This is a patient that has been followed here 4 years for chronic anemia. In 2011, she had a bone marrow aspirate and biopsy that came back normal. The patient has been receiving Aranesp injection for chronic anemia up until 2014. It was subsequently discontinued due stability of the anemia. INTERVAL HISTORY: Abigail Hoover 69 y.o. female returns for further follow-up. She complained of fatigue, dizziness and weakness. The patient denies any recent signs or symptoms of bleeding such as spontaneous epistaxis, hematuria or hematochezia. She desire Aranesp injection to be resumed.  I have reviewed the past medical history, past surgical history, social history and family history with the patient and they are unchanged from previous note.  ALLERGIES:  is allergic to ezetimibe and atorvastatin.  MEDICATIONS:  Current Outpatient Prescriptions  Medication Sig Dispense Refill  . amLODipine (NORVASC) 10 MG tablet Take 10 mg by mouth daily.    Marland Kitchen aspirin 81 MG tablet Take 81 mg by mouth daily.      . Blood Glucose Monitoring Suppl (ONE TOUCH ULTRA 2) W/DEVICE KIT Use to check blood sugars 1 time per day 1 each 0  . CARAFATE 1 GM/10ML suspension     . Cholecalciferol (VITAMIN D3) 1000 UNITS CAPS Take 1,000 Units by mouth 2 (two) times daily.     . Colesevelam HCl 3.75 G PACK Use one packet daily 90 each 1  . dexlansoprazole (DEXILANT) 60 MG capsule Take 60 mg by mouth daily.      . furosemide (LASIX) 20 MG tablet Take 1 tablet (20 mg total) by mouth daily as needed. 90 tablet 1  . gabapentin (NEURONTIN) 600 MG tablet Take 1 tablet (600 mg total) by mouth 3 (three) times daily. 90 tablet 3  . GLYSET 50 MG tablet ONE TABLET BY MOUTH TWICE A DAY 180 tablet 1  . ibandronate (BONIVA) 150 MG tablet   2  . levothyroxine (SYNTHROID, LEVOTHROID) 88 MCG tablet Take 1 tablet (88 mcg total) by mouth daily. 90  tablet 1  . metoprolol succinate (TOPROL-XL) 100 MG 24 hr tablet Take 100 mg by mouth daily. Take with or immediately following a meal. Patient takes 1 1/2 tablets per day    . MYRBETRIQ 25 MG TB24 Take 1 tablet by mouth Daily.    . pentosan polysulfate (ELMIRON) 100 MG capsule Take 100 mg by mouth 3 (three) times daily before meals.     Marland Kitchen PROCTOSOL HC 2.5 % rectal cream     . sitaGLIPtin-metformin (JANUMET) 50-500 MG per tablet Take 1 tablet by mouth 2 (two) times daily with a meal. 90 tablet 1  . triamcinolone cream (KENALOG) 0.1 %     . vitamin B-12 (CYANOCOBALAMIN) 1000 MCG tablet Take 1,000 mcg by mouth daily. Vitamin B12     No current facility-administered medications for this visit.     REVIEW OF SYSTEMS:   Constitutional: Denies fevers, chills or night sweats Eyes: Denies blurriness of vision Ears, nose, mouth, throat, and face: Denies mucositis or sore throat Respiratory: Denies cough, dyspnea or wheezes Cardiovascular: Denies palpitation, chest discomfort or lower extremity swelling Gastrointestinal:  Denies nausea, heartburn or change in bowel habits Skin: Denies abnormal skin rashes Lymphatics: Denies new lymphadenopathy or easy bruising Neurological:Denies numbness, tingling or new weaknesses Behavioral/Psych: Mood is stable, no new changes  All other systems were reviewed with the patient and are negative.  PHYSICAL EXAMINATION: ECOG PERFORMANCE STATUS: 0 -  Asymptomatic  Filed Vitals:   05/27/14 1514  BP: 154/47  Pulse: 66  Temp: 97.7 F (36.5 C)  Resp: 17   Filed Weights   05/27/14 1514  Weight: 113 lb (51.256 kg)    GENERAL:alert, no distress and comfortable SKIN: skin color, texture, turgor are normal, no rashes or significant lesions EYES: normal, Conjunctiva are pink and non-injected, sclera clear Musculoskeletal:no cyanosis of digits and no clubbing  NEURO: alert & oriented x 3 with fluent speech, no focal motor/sensory deficits  LABORATORY DATA:   I have reviewed the data as listed Results for orders placed or performed in visit on 05/27/14 (from the past 48 hour(s))  CBC & Diff and Retic     Status: Abnormal   Collection Time: 05/27/14  2:29 PM  Result Value Ref Range   WBC 6.4 3.9 - 10.3 10e3/uL   NEUT# 3.8 1.5 - 6.5 10e3/uL   HGB 9.6 (L) 11.6 - 15.9 g/dL   HCT 31.4 (L) 34.8 - 46.6 %   Platelets 257 145 - 400 10e3/uL   MCV 86.7 79.5 - 101.0 fL   MCH 26.5 25.1 - 34.0 pg   MCHC 30.6 (L) 31.5 - 36.0 g/dL   RBC 3.62 (L) 3.70 - 5.45 10e6/uL   RDW 14.2 11.2 - 14.5 %   lymph# 1.6 0.9 - 3.3 10e3/uL   MONO# 0.4 0.1 - 0.9 10e3/uL   Eosinophils Absolute 0.6 (H) 0.0 - 0.5 10e3/uL   Basophils Absolute 0.0 0.0 - 0.1 10e3/uL   NEUT% 60.1 38.4 - 76.8 %   LYMPH% 24.5 14.0 - 49.7 %   MONO% 6.3 0.0 - 14.0 %   EOS% 8.8 (H) 0.0 - 7.0 %   BASO% 0.3 0.0 - 2.0 %   nRBC 0 0 - 0 %   Retic % 1.12 0.70 - 2.10 %   Retic Ct Abs 40.54 33.70 - 90.70 10e3/uL   Immature Retic Fract 6.00 1.60 - 10.00 %  Hold Tube, Blood Bank     Status: None   Collection Time: 05/27/14  2:29 PM  Result Value Ref Range   Hold Tube, Blood Bank Blood Bank Order Cancelled     Lab Results  Component Value Date   WBC 6.4 05/27/2014   HGB 9.6* 05/27/2014   HCT 31.4* 05/27/2014   MCV 86.7 05/27/2014   PLT 257 05/27/2014    ASSESSMENT & PLAN:  Anemia, chronic disease She has significant fatigue. In the past, she had responded to Aranesp injection. I discontinued her injection when her hemoglobin was greater than 10. Since we discontinued that, she had progressive fatigue, shortness of breath and dizziness. She is getting progressively anemic. She requested resumption of Aranesp injection. We discussed the risk, benefit, side effects or Aranesp injection including risk of thrombosis and headaches. She agreed to proceed. We will give her 200 g every 3 weeks with goal to keep hemoglobin greater than 11.   Deficiency anemia This is likely anemia of chronic disease. The  patient denies recent history of bleeding such as epistaxis, hematuria or hematochezia.  I will reorder serum vitamin B-12 and iron studies. Her last EPO level was low and I think she might benefit from Aranesp injection.   Essential hypertension Her blood pressure is a little high today. I had a long discussion with the patient. If her blood pressure is greater than 160 in the future, she will not get Aranesp injection. The patient says she missed 1 dose of blood pressure medication this morning.  She is aware that Aranesp will cause risk of worsening hypertension.    All questions were answered. The patient knows to call the clinic with any problems, questions or concerns. No barriers to learning was detected.  I spent 25 minutes counseling the patient face to face. The total time spent in the appointment was 30 minutes and more than 50% was on counseling.     [facility], Jaloni Sorber, MD 3/4/20164:47 PM

## 2014-05-27 NOTE — Telephone Encounter (Signed)
Lft msg for pt confirming labs/inj/ov per 03/04 POF, mailed schedule to pt.... KJ

## 2014-05-27 NOTE — Assessment & Plan Note (Signed)
Her blood pressure is a little high today. I had a long discussion with the patient. If her blood pressure is greater than 160 in the future, she will not get Aranesp injection. The patient says she missed 1 dose of blood pressure medication this morning. She is aware that Aranesp will cause risk of worsening hypertension.

## 2014-05-30 LAB — FERRITIN CHCC: Ferritin: 130 ng/ml (ref 9–269)

## 2014-05-30 LAB — IRON AND TIBC CHCC
%SAT: 16 % — ABNORMAL LOW (ref 21–57)
Iron: 52 ug/dL (ref 41–142)
TIBC: 324 ug/dL (ref 236–444)
UIBC: 272 ug/dL (ref 120–384)

## 2014-05-30 LAB — VITAMIN B12: Vitamin B-12: 1363 pg/mL — ABNORMAL HIGH (ref 211–911)

## 2014-05-30 LAB — ERYTHROPOIETIN: Erythropoietin: 21.5 m[IU]/mL — ABNORMAL HIGH (ref 2.6–18.5)

## 2014-06-01 ENCOUNTER — Other Ambulatory Visit: Payer: Self-pay | Admitting: Endocrinology

## 2014-06-03 DIAGNOSIS — N390 Urinary tract infection, site not specified: Secondary | ICD-10-CM | POA: Diagnosis not present

## 2014-06-03 DIAGNOSIS — R300 Dysuria: Secondary | ICD-10-CM | POA: Diagnosis not present

## 2014-06-10 ENCOUNTER — Telehealth: Payer: Self-pay | Admitting: Endocrinology

## 2014-06-10 NOTE — Telephone Encounter (Signed)
Pt has questions regarding ibandronade.

## 2014-06-13 ENCOUNTER — Other Ambulatory Visit: Payer: Self-pay | Admitting: *Deleted

## 2014-06-13 DIAGNOSIS — M5431 Sciatica, right side: Secondary | ICD-10-CM | POA: Diagnosis not present

## 2014-06-13 MED ORDER — IBANDRONATE SODIUM 150 MG PO TABS: ORAL_TABLET | ORAL | Status: DC

## 2014-06-13 NOTE — Telephone Encounter (Signed)
Patient wants to know if she should start the boniva now?

## 2014-06-13 NOTE — Telephone Encounter (Signed)
She told me she had been taking it since November.  Needs to continue indefinitely

## 2014-06-17 ENCOUNTER — Ambulatory Visit: Payer: Medicare Other

## 2014-06-17 ENCOUNTER — Other Ambulatory Visit: Payer: Medicare Other

## 2014-06-21 DIAGNOSIS — N3010 Interstitial cystitis (chronic) without hematuria: Secondary | ICD-10-CM | POA: Diagnosis not present

## 2014-06-21 DIAGNOSIS — R1030 Lower abdominal pain, unspecified: Secondary | ICD-10-CM | POA: Diagnosis not present

## 2014-06-21 DIAGNOSIS — R300 Dysuria: Secondary | ICD-10-CM | POA: Diagnosis not present

## 2014-06-21 DIAGNOSIS — R3915 Urgency of urination: Secondary | ICD-10-CM | POA: Diagnosis not present

## 2014-06-21 DIAGNOSIS — R102 Pelvic and perineal pain: Secondary | ICD-10-CM | POA: Diagnosis not present

## 2014-06-24 ENCOUNTER — Telehealth: Payer: Self-pay | Admitting: Endocrinology

## 2014-06-24 NOTE — Telephone Encounter (Signed)
Patient need refill of Janumet

## 2014-06-27 DIAGNOSIS — M545 Low back pain: Secondary | ICD-10-CM | POA: Diagnosis not present

## 2014-06-27 DIAGNOSIS — M549 Dorsalgia, unspecified: Secondary | ICD-10-CM | POA: Diagnosis not present

## 2014-06-27 DIAGNOSIS — M5136 Other intervertebral disc degeneration, lumbar region: Secondary | ICD-10-CM | POA: Diagnosis not present

## 2014-06-27 DIAGNOSIS — M4716 Other spondylosis with myelopathy, lumbar region: Secondary | ICD-10-CM | POA: Insufficient documentation

## 2014-06-27 DIAGNOSIS — M47816 Spondylosis without myelopathy or radiculopathy, lumbar region: Secondary | ICD-10-CM | POA: Diagnosis not present

## 2014-06-27 DIAGNOSIS — M546 Pain in thoracic spine: Secondary | ICD-10-CM | POA: Diagnosis not present

## 2014-06-28 DIAGNOSIS — N3010 Interstitial cystitis (chronic) without hematuria: Secondary | ICD-10-CM | POA: Diagnosis not present

## 2014-06-30 DIAGNOSIS — N3010 Interstitial cystitis (chronic) without hematuria: Secondary | ICD-10-CM | POA: Diagnosis not present

## 2014-07-08 ENCOUNTER — Other Ambulatory Visit (HOSPITAL_BASED_OUTPATIENT_CLINIC_OR_DEPARTMENT_OTHER): Payer: Medicare Other

## 2014-07-08 ENCOUNTER — Ambulatory Visit: Payer: Medicare Other

## 2014-07-08 DIAGNOSIS — D638 Anemia in other chronic diseases classified elsewhere: Secondary | ICD-10-CM | POA: Diagnosis present

## 2014-07-08 LAB — CBC WITH DIFFERENTIAL/PLATELET
BASO%: 0.5 % (ref 0.0–2.0)
Basophils Absolute: 0 10*3/uL (ref 0.0–0.1)
EOS%: 6.3 % (ref 0.0–7.0)
Eosinophils Absolute: 0.4 10*3/uL (ref 0.0–0.5)
HCT: 36.6 % (ref 34.8–46.6)
HGB: 11.1 g/dL — ABNORMAL LOW (ref 11.6–15.9)
LYMPH%: 32.3 % (ref 14.0–49.7)
MCH: 26.1 pg (ref 25.1–34.0)
MCHC: 30.3 g/dL — ABNORMAL LOW (ref 31.5–36.0)
MCV: 85.9 fL (ref 79.5–101.0)
MONO#: 0.4 10*3/uL (ref 0.1–0.9)
MONO%: 5.8 % (ref 0.0–14.0)
NEUT#: 3.4 10*3/uL (ref 1.5–6.5)
NEUT%: 55.1 % (ref 38.4–76.8)
Platelets: 222 10*3/uL (ref 145–400)
RBC: 4.26 10*6/uL (ref 3.70–5.45)
RDW: 13.8 % (ref 11.2–14.5)
WBC: 6.1 10*3/uL (ref 3.9–10.3)
lymph#: 2 10*3/uL (ref 0.9–3.3)

## 2014-07-08 MED ORDER — DARBEPOETIN ALFA 200 MCG/0.4ML IJ SOSY: 200.0000 ug | PREFILLED_SYRINGE | Freq: Once | INTRAMUSCULAR | Status: DC

## 2014-07-12 DIAGNOSIS — R1030 Lower abdominal pain, unspecified: Secondary | ICD-10-CM | POA: Diagnosis not present

## 2014-07-12 DIAGNOSIS — R300 Dysuria: Secondary | ICD-10-CM | POA: Diagnosis not present

## 2014-07-12 DIAGNOSIS — R350 Frequency of micturition: Secondary | ICD-10-CM | POA: Diagnosis not present

## 2014-07-12 DIAGNOSIS — R3915 Urgency of urination: Secondary | ICD-10-CM | POA: Diagnosis not present

## 2014-07-12 DIAGNOSIS — N3010 Interstitial cystitis (chronic) without hematuria: Secondary | ICD-10-CM | POA: Diagnosis not present

## 2014-07-12 DIAGNOSIS — R102 Pelvic and perineal pain: Secondary | ICD-10-CM | POA: Diagnosis not present

## 2014-07-13 ENCOUNTER — Encounter: Payer: Self-pay | Admitting: Endocrinology

## 2014-07-13 ENCOUNTER — Telehealth: Payer: Self-pay | Admitting: Endocrinology

## 2014-07-13 NOTE — Telephone Encounter (Signed)
Patient  granddaughter has question about patients lab results, please advise

## 2014-07-18 ENCOUNTER — Telehealth: Payer: Self-pay | Admitting: Endocrinology

## 2014-07-18 NOTE — Telephone Encounter (Signed)
Can you please advise on why you don't want her to take Calcium supplements?

## 2014-07-18 NOTE — Telephone Encounter (Signed)
Would like to avoid calcium supplements

## 2014-07-18 NOTE — Telephone Encounter (Signed)
Please advise 

## 2014-07-18 NOTE — Telephone Encounter (Signed)
Risk of high calcium level

## 2014-07-18 NOTE — Telephone Encounter (Signed)
Noted, patient is aware. 

## 2014-07-18 NOTE — Telephone Encounter (Signed)
Patients family member called regarding Abigail Hoover's calcium  She would like to know if she can resume taking as she has not been taking since 6 months   Please advise    Thank You

## 2014-07-25 DIAGNOSIS — E1140 Type 2 diabetes mellitus with diabetic neuropathy, unspecified: Secondary | ICD-10-CM | POA: Diagnosis not present

## 2014-07-25 DIAGNOSIS — E559 Vitamin D deficiency, unspecified: Secondary | ICD-10-CM | POA: Diagnosis not present

## 2014-07-25 DIAGNOSIS — M1990 Unspecified osteoarthritis, unspecified site: Secondary | ICD-10-CM | POA: Diagnosis not present

## 2014-07-25 DIAGNOSIS — E538 Deficiency of other specified B group vitamins: Secondary | ICD-10-CM | POA: Diagnosis not present

## 2014-07-25 DIAGNOSIS — I10 Essential (primary) hypertension: Secondary | ICD-10-CM | POA: Diagnosis not present

## 2014-07-25 DIAGNOSIS — E785 Hyperlipidemia, unspecified: Secondary | ICD-10-CM | POA: Diagnosis not present

## 2014-07-25 DIAGNOSIS — E039 Hypothyroidism, unspecified: Secondary | ICD-10-CM | POA: Diagnosis not present

## 2014-07-25 DIAGNOSIS — I2510 Atherosclerotic heart disease of native coronary artery without angina pectoris: Secondary | ICD-10-CM | POA: Diagnosis not present

## 2014-07-25 DIAGNOSIS — E119 Type 2 diabetes mellitus without complications: Secondary | ICD-10-CM | POA: Diagnosis not present

## 2014-07-28 ENCOUNTER — Telehealth: Payer: Self-pay | Admitting: Hematology and Oncology

## 2014-07-28 NOTE — Telephone Encounter (Signed)
pt called to r/s appt...done....pt ok and aware of new d.t °

## 2014-07-29 ENCOUNTER — Ambulatory Visit: Payer: Medicare Other

## 2014-07-29 ENCOUNTER — Other Ambulatory Visit: Payer: Medicare Other

## 2014-08-01 ENCOUNTER — Other Ambulatory Visit (HOSPITAL_BASED_OUTPATIENT_CLINIC_OR_DEPARTMENT_OTHER): Payer: Medicare Other

## 2014-08-01 ENCOUNTER — Ambulatory Visit (HOSPITAL_BASED_OUTPATIENT_CLINIC_OR_DEPARTMENT_OTHER): Payer: Medicare Other

## 2014-08-01 VITALS — BP 166/58 | HR 59 | Temp 97.9°F

## 2014-08-01 DIAGNOSIS — D638 Anemia in other chronic diseases classified elsewhere: Secondary | ICD-10-CM

## 2014-08-01 DIAGNOSIS — IMO0002 Reserved for concepts with insufficient information to code with codable children: Secondary | ICD-10-CM

## 2014-08-01 DIAGNOSIS — E1165 Type 2 diabetes mellitus with hyperglycemia: Secondary | ICD-10-CM

## 2014-08-01 LAB — CBC WITH DIFFERENTIAL/PLATELET
BASO%: 0.2 % (ref 0.0–2.0)
Basophils Absolute: 0 10*3/uL (ref 0.0–0.1)
EOS%: 8.5 % — ABNORMAL HIGH (ref 0.0–7.0)
Eosinophils Absolute: 0.5 10*3/uL (ref 0.0–0.5)
HCT: 33.9 % — ABNORMAL LOW (ref 34.8–46.6)
HGB: 10.5 g/dL — ABNORMAL LOW (ref 11.6–15.9)
LYMPH%: 33.3 % (ref 14.0–49.7)
MCH: 25.9 pg (ref 25.1–34.0)
MCHC: 31 g/dL — ABNORMAL LOW (ref 31.5–36.0)
MCV: 83.7 fL (ref 79.5–101.0)
MONO#: 0.4 10*3/uL (ref 0.1–0.9)
MONO%: 6.4 % (ref 0.0–14.0)
NEUT#: 3 10*3/uL (ref 1.5–6.5)
NEUT%: 51.6 % (ref 38.4–76.8)
Platelets: 189 10*3/uL (ref 145–400)
RBC: 4.05 10*6/uL (ref 3.70–5.45)
RDW: 14.2 % (ref 11.2–14.5)
WBC: 5.8 10*3/uL (ref 3.9–10.3)
lymph#: 1.9 10*3/uL (ref 0.9–3.3)

## 2014-08-01 MED ORDER — DARBEPOETIN ALFA 200 MCG/0.4ML IJ SOSY
200.0000 ug | PREFILLED_SYRINGE | Freq: Once | INTRAMUSCULAR | Status: AC
Start: 2014-08-01 — End: 2014-08-01
  Administered 2014-08-01: 200 ug via SUBCUTANEOUS
  Filled 2014-08-01: qty 0.4

## 2014-08-12 ENCOUNTER — Other Ambulatory Visit: Payer: Medicare Other

## 2014-08-12 ENCOUNTER — Ambulatory Visit: Payer: Medicare Other | Admitting: Hematology and Oncology

## 2014-08-19 ENCOUNTER — Ambulatory Visit: Payer: Medicare Other

## 2014-08-19 ENCOUNTER — Other Ambulatory Visit (HOSPITAL_BASED_OUTPATIENT_CLINIC_OR_DEPARTMENT_OTHER): Payer: Medicare Other

## 2014-08-19 DIAGNOSIS — D638 Anemia in other chronic diseases classified elsewhere: Secondary | ICD-10-CM | POA: Diagnosis present

## 2014-08-19 LAB — CBC WITH DIFFERENTIAL/PLATELET
BASO%: 0.4 % (ref 0.0–2.0)
Basophils Absolute: 0 10*3/uL (ref 0.0–0.1)
EOS%: 7.2 % — ABNORMAL HIGH (ref 0.0–7.0)
Eosinophils Absolute: 0.4 10*3/uL (ref 0.0–0.5)
HCT: 36.6 % (ref 34.8–46.6)
HGB: 11.1 g/dL — ABNORMAL LOW (ref 11.6–15.9)
LYMPH%: 29.8 % (ref 14.0–49.7)
MCH: 25.9 pg (ref 25.1–34.0)
MCHC: 30.3 g/dL — ABNORMAL LOW (ref 31.5–36.0)
MCV: 85.5 fL (ref 79.5–101.0)
MONO#: 0.4 10*3/uL (ref 0.1–0.9)
MONO%: 7.9 % (ref 0.0–14.0)
NEUT#: 3 10*3/uL (ref 1.5–6.5)
NEUT%: 54.7 % (ref 38.4–76.8)
Platelets: 202 10*3/uL (ref 145–400)
RBC: 4.28 10*6/uL (ref 3.70–5.45)
RDW: 15.1 % — ABNORMAL HIGH (ref 11.2–14.5)
WBC: 5.4 10*3/uL (ref 3.9–10.3)
lymph#: 1.6 10*3/uL (ref 0.9–3.3)

## 2014-08-19 MED ORDER — DARBEPOETIN ALFA 200 MCG/0.4ML IJ SOSY: 200.0000 ug | PREFILLED_SYRINGE | Freq: Once | INTRAMUSCULAR | Status: DC

## 2014-08-24 ENCOUNTER — Other Ambulatory Visit: Payer: Self-pay | Admitting: Endocrinology

## 2014-09-09 ENCOUNTER — Other Ambulatory Visit: Payer: Medicare Other

## 2014-09-09 ENCOUNTER — Ambulatory Visit: Payer: Medicare Other

## 2014-09-13 ENCOUNTER — Telehealth: Payer: Self-pay | Admitting: Hematology and Oncology

## 2014-09-13 NOTE — Telephone Encounter (Signed)
pt called to confirm appts...pt ok and aware °

## 2014-09-18 ENCOUNTER — Other Ambulatory Visit: Payer: Self-pay | Admitting: Endocrinology

## 2014-09-19 ENCOUNTER — Other Ambulatory Visit: Payer: Self-pay

## 2014-09-29 ENCOUNTER — Other Ambulatory Visit: Payer: Self-pay | Admitting: Endocrinology

## 2014-09-30 ENCOUNTER — Telehealth: Payer: Self-pay | Admitting: Endocrinology

## 2014-09-30 ENCOUNTER — Other Ambulatory Visit (HOSPITAL_BASED_OUTPATIENT_CLINIC_OR_DEPARTMENT_OTHER): Payer: Medicare Other

## 2014-09-30 ENCOUNTER — Ambulatory Visit (HOSPITAL_BASED_OUTPATIENT_CLINIC_OR_DEPARTMENT_OTHER): Payer: Medicare Other

## 2014-09-30 VITALS — BP 161/50 | HR 60 | Temp 97.9°F

## 2014-09-30 DIAGNOSIS — D638 Anemia in other chronic diseases classified elsewhere: Secondary | ICD-10-CM | POA: Diagnosis present

## 2014-09-30 LAB — CBC WITH DIFFERENTIAL/PLATELET
BASO%: 0.2 % (ref 0.0–2.0)
Basophils Absolute: 0 10*3/uL (ref 0.0–0.1)
EOS%: 14 % — ABNORMAL HIGH (ref 0.0–7.0)
Eosinophils Absolute: 0.9 10*3/uL — ABNORMAL HIGH (ref 0.0–0.5)
HCT: 31.6 % — ABNORMAL LOW (ref 34.8–46.6)
HGB: 10 g/dL — ABNORMAL LOW (ref 11.6–15.9)
LYMPH%: 28.3 % (ref 14.0–49.7)
MCH: 25.7 pg (ref 25.1–34.0)
MCHC: 31.6 g/dL (ref 31.5–36.0)
MCV: 81.2 fL (ref 79.5–101.0)
MONO#: 0.4 10*3/uL (ref 0.1–0.9)
MONO%: 6.2 % (ref 0.0–14.0)
NEUT#: 3.2 10*3/uL (ref 1.5–6.5)
NEUT%: 51.3 % (ref 38.4–76.8)
Platelets: 166 10*3/uL (ref 145–400)
RBC: 3.89 10*6/uL (ref 3.70–5.45)
RDW: 15.2 % — ABNORMAL HIGH (ref 11.2–14.5)
WBC: 6.3 10*3/uL (ref 3.9–10.3)
lymph#: 1.8 10*3/uL (ref 0.9–3.3)

## 2014-09-30 MED ORDER — DARBEPOETIN ALFA 200 MCG/0.4ML IJ SOSY
200.0000 ug | PREFILLED_SYRINGE | Freq: Once | INTRAMUSCULAR | Status: AC
  Administered 2014-09-30: 200 ug via SUBCUTANEOUS
  Filled 2014-09-30: qty 0.4

## 2014-09-30 NOTE — Telephone Encounter (Signed)
Pt needs refill on janunept

## 2014-09-30 NOTE — Telephone Encounter (Signed)
rx sent this morning.

## 2014-10-05 ENCOUNTER — Other Ambulatory Visit: Payer: Self-pay

## 2014-10-05 DIAGNOSIS — E871 Hypo-osmolality and hyponatremia: Secondary | ICD-10-CM | POA: Diagnosis not present

## 2014-10-05 DIAGNOSIS — Z803 Family history of malignant neoplasm of breast: Secondary | ICD-10-CM

## 2014-10-05 DIAGNOSIS — R05 Cough: Secondary | ICD-10-CM | POA: Diagnosis not present

## 2014-10-05 DIAGNOSIS — Z9189 Other specified personal risk factors, not elsewhere classified: Secondary | ICD-10-CM | POA: Diagnosis not present

## 2014-10-05 NOTE — Addendum Note (Signed)
Addended by: Odis Hollingshead on: 10/05/2014 11:48 AM   Modules accepted: Orders

## 2014-10-06 DIAGNOSIS — R229 Localized swelling, mass and lump, unspecified: Secondary | ICD-10-CM | POA: Diagnosis not present

## 2014-10-18 DIAGNOSIS — R229 Localized swelling, mass and lump, unspecified: Secondary | ICD-10-CM | POA: Diagnosis not present

## 2014-10-21 ENCOUNTER — Ambulatory Visit: Payer: Medicare Other

## 2014-10-21 ENCOUNTER — Other Ambulatory Visit: Payer: Medicare Other

## 2014-10-21 ENCOUNTER — Telehealth: Payer: Self-pay | Admitting: Hematology and Oncology

## 2014-10-21 NOTE — Telephone Encounter (Signed)
pt called to r/s appt...done....pt is aware of new d.t

## 2014-10-24 ENCOUNTER — Other Ambulatory Visit (HOSPITAL_BASED_OUTPATIENT_CLINIC_OR_DEPARTMENT_OTHER): Payer: Medicare Other

## 2014-10-24 ENCOUNTER — Telehealth: Payer: Self-pay | Admitting: Hematology and Oncology

## 2014-10-24 ENCOUNTER — Ambulatory Visit: Payer: Medicare Other

## 2014-10-24 DIAGNOSIS — D638 Anemia in other chronic diseases classified elsewhere: Secondary | ICD-10-CM | POA: Diagnosis present

## 2014-10-24 LAB — CBC WITH DIFFERENTIAL/PLATELET
BASO%: 0.6 % (ref 0.0–2.0)
Basophils Absolute: 0 10*3/uL (ref 0.0–0.1)
EOS%: 13.8 % — ABNORMAL HIGH (ref 0.0–7.0)
Eosinophils Absolute: 0.7 10*3/uL — ABNORMAL HIGH (ref 0.0–0.5)
HCT: 36.1 % (ref 34.8–46.6)
HGB: 11 g/dL — ABNORMAL LOW (ref 11.6–15.9)
LYMPH%: 26.6 % (ref 14.0–49.7)
MCH: 25 pg — ABNORMAL LOW (ref 25.1–34.0)
MCHC: 30.6 g/dL — ABNORMAL LOW (ref 31.5–36.0)
MCV: 81.6 fL (ref 79.5–101.0)
MONO#: 0.3 10*3/uL (ref 0.1–0.9)
MONO%: 6 % (ref 0.0–14.0)
NEUT#: 2.8 10*3/uL (ref 1.5–6.5)
NEUT%: 53 % (ref 38.4–76.8)
Platelets: 243 10*3/uL (ref 145–400)
RBC: 4.42 10*6/uL (ref 3.70–5.45)
RDW: 16.5 % — ABNORMAL HIGH (ref 11.2–14.5)
WBC: 5.3 10*3/uL (ref 3.9–10.3)
lymph#: 1.4 10*3/uL (ref 0.9–3.3)

## 2014-10-24 MED ORDER — DARBEPOETIN ALFA 200 MCG/0.4ML IJ SOSY: 200.0000 ug | PREFILLED_SYRINGE | Freq: Once | INTRAMUSCULAR | Status: DC

## 2014-10-24 NOTE — Telephone Encounter (Signed)
returned call and s.w. pt and confirmed todays appts pt ok and aware

## 2014-10-29 ENCOUNTER — Other Ambulatory Visit: Payer: Self-pay | Admitting: Endocrinology

## 2014-10-31 ENCOUNTER — Other Ambulatory Visit: Payer: Self-pay

## 2014-10-31 ENCOUNTER — Ambulatory Visit: Payer: Medicare Other

## 2014-10-31 ENCOUNTER — Ambulatory Visit
Admission: RE | Admit: 2014-10-31 | Discharge: 2014-10-31 | Disposition: A | Discharge status: Home / Self Care | Payer: Medicare Other | Source: Ambulatory Visit

## 2014-10-31 DIAGNOSIS — I2510 Atherosclerotic heart disease of native coronary artery without angina pectoris: Secondary | ICD-10-CM | POA: Diagnosis not present

## 2014-10-31 DIAGNOSIS — E539 Vitamin B deficiency, unspecified: Secondary | ICD-10-CM | POA: Diagnosis not present

## 2014-10-31 DIAGNOSIS — E559 Vitamin D deficiency, unspecified: Secondary | ICD-10-CM | POA: Diagnosis not present

## 2014-10-31 DIAGNOSIS — E1140 Type 2 diabetes mellitus with diabetic neuropathy, unspecified: Secondary | ICD-10-CM | POA: Diagnosis not present

## 2014-10-31 DIAGNOSIS — Z1231 Encounter for screening mammogram for malignant neoplasm of breast: Secondary | ICD-10-CM | POA: Diagnosis not present

## 2014-10-31 DIAGNOSIS — E785 Hyperlipidemia, unspecified: Secondary | ICD-10-CM | POA: Diagnosis not present

## 2014-10-31 DIAGNOSIS — M1990 Unspecified osteoarthritis, unspecified site: Secondary | ICD-10-CM | POA: Diagnosis not present

## 2014-10-31 DIAGNOSIS — E039 Hypothyroidism, unspecified: Secondary | ICD-10-CM | POA: Diagnosis not present

## 2014-10-31 DIAGNOSIS — I10 Essential (primary) hypertension: Secondary | ICD-10-CM | POA: Diagnosis not present

## 2014-10-31 DIAGNOSIS — E119 Type 2 diabetes mellitus without complications: Secondary | ICD-10-CM | POA: Diagnosis not present

## 2014-11-03 ENCOUNTER — Ambulatory Visit: Payer: Medicare Other | Admitting: Podiatry

## 2014-11-07 ENCOUNTER — Telehealth: Payer: Self-pay | Admitting: Endocrinology

## 2014-11-08 ENCOUNTER — Other Ambulatory Visit: Payer: Self-pay | Admitting: Endocrinology

## 2014-11-08 ENCOUNTER — Other Ambulatory Visit: Payer: Medicare Other

## 2014-11-11 ENCOUNTER — Ambulatory Visit: Payer: Medicare Other

## 2014-11-11 ENCOUNTER — Other Ambulatory Visit (HOSPITAL_BASED_OUTPATIENT_CLINIC_OR_DEPARTMENT_OTHER): Payer: Medicare Other

## 2014-11-11 DIAGNOSIS — D638 Anemia in other chronic diseases classified elsewhere: Secondary | ICD-10-CM | POA: Diagnosis present

## 2014-11-11 LAB — CBC WITH DIFFERENTIAL/PLATELET
BASO%: 0.6 % (ref 0.0–2.0)
Basophils Absolute: 0 10*3/uL (ref 0.0–0.1)
EOS%: 11.9 % — ABNORMAL HIGH (ref 0.0–7.0)
Eosinophils Absolute: 0.9 10*3/uL — ABNORMAL HIGH (ref 0.0–0.5)
HCT: 35.3 % (ref 34.8–46.6)
HGB: 11.2 g/dL — ABNORMAL LOW (ref 11.6–15.9)
LYMPH%: 21.5 % (ref 14.0–49.7)
MCH: 25.7 pg (ref 25.1–34.0)
MCHC: 31.6 g/dL (ref 31.5–36.0)
MCV: 81.4 fL (ref 79.5–101.0)
MONO#: 0.4 10*3/uL (ref 0.1–0.9)
MONO%: 5.9 % (ref 0.0–14.0)
NEUT#: 4.3 10*3/uL (ref 1.5–6.5)
NEUT%: 60.1 % (ref 38.4–76.8)
Platelets: 258 10*3/uL (ref 145–400)
RBC: 4.34 10*6/uL (ref 3.70–5.45)
RDW: 15.2 % — ABNORMAL HIGH (ref 11.2–14.5)
WBC: 7.2 10*3/uL (ref 3.9–10.3)
lymph#: 1.5 10*3/uL (ref 0.9–3.3)

## 2014-11-11 MED ORDER — DARBEPOETIN ALFA 200 MCG/0.4ML IJ SOSY: 200.0000 ug | PREFILLED_SYRINGE | Freq: Once | INTRAMUSCULAR | Status: DC

## 2014-11-15 DIAGNOSIS — N3010 Interstitial cystitis (chronic) without hematuria: Secondary | ICD-10-CM | POA: Diagnosis not present

## 2014-11-15 DIAGNOSIS — R1030 Lower abdominal pain, unspecified: Secondary | ICD-10-CM | POA: Diagnosis not present

## 2014-11-15 DIAGNOSIS — R3915 Urgency of urination: Secondary | ICD-10-CM | POA: Diagnosis not present

## 2014-11-15 DIAGNOSIS — R350 Frequency of micturition: Secondary | ICD-10-CM | POA: Diagnosis not present

## 2014-11-15 DIAGNOSIS — R300 Dysuria: Secondary | ICD-10-CM | POA: Diagnosis not present

## 2014-11-15 DIAGNOSIS — R102 Pelvic and perineal pain: Secondary | ICD-10-CM | POA: Diagnosis not present

## 2014-11-21 ENCOUNTER — Other Ambulatory Visit: Payer: Self-pay | Admitting: Endocrinology

## 2014-11-22 ENCOUNTER — Other Ambulatory Visit: Payer: Self-pay | Admitting: *Deleted

## 2014-11-22 MED ORDER — FUROSEMIDE 20 MG PO TABS: 20.0000 mg | ORAL_TABLET | Freq: Every day | ORAL | Status: DC | PRN

## 2014-12-01 ENCOUNTER — Other Ambulatory Visit: Payer: Self-pay | Admitting: *Deleted

## 2014-12-01 MED ORDER — COLESEVELAM HCL 3.75 G PO PACK: PACK | ORAL | Status: DC

## 2014-12-02 ENCOUNTER — Ambulatory Visit (HOSPITAL_BASED_OUTPATIENT_CLINIC_OR_DEPARTMENT_OTHER): Payer: Medicare Other | Admitting: Hematology and Oncology

## 2014-12-02 ENCOUNTER — Encounter: Payer: Self-pay | Admitting: Hematology and Oncology

## 2014-12-02 ENCOUNTER — Ambulatory Visit (HOSPITAL_BASED_OUTPATIENT_CLINIC_OR_DEPARTMENT_OTHER): Payer: Medicare Other

## 2014-12-02 ENCOUNTER — Other Ambulatory Visit (HOSPITAL_BASED_OUTPATIENT_CLINIC_OR_DEPARTMENT_OTHER): Payer: Medicare Other

## 2014-12-02 VITALS — BP 127/72 | HR 63 | Temp 97.8°F | Resp 18 | Ht 61.0 in | Wt 111.5 lb

## 2014-12-02 DIAGNOSIS — D638 Anemia in other chronic diseases classified elsewhere: Secondary | ICD-10-CM

## 2014-12-02 LAB — CBC WITH DIFFERENTIAL/PLATELET
BASO%: 0.5 % (ref 0.0–2.0)
Basophils Absolute: 0 10*3/uL (ref 0.0–0.1)
EOS%: 13.4 % — ABNORMAL HIGH (ref 0.0–7.0)
Eosinophils Absolute: 0.8 10*3/uL — ABNORMAL HIGH (ref 0.0–0.5)
HCT: 32.1 % — ABNORMAL LOW (ref 34.8–46.6)
HGB: 10.2 g/dL — ABNORMAL LOW (ref 11.6–15.9)
LYMPH%: 30.2 % (ref 14.0–49.7)
MCH: 25.7 pg (ref 25.1–34.0)
MCHC: 31.7 g/dL (ref 31.5–36.0)
MCV: 80.9 fL (ref 79.5–101.0)
MONO#: 0.3 10*3/uL (ref 0.1–0.9)
MONO%: 6.1 % (ref 0.0–14.0)
NEUT#: 2.8 10*3/uL (ref 1.5–6.5)
NEUT%: 49.8 % (ref 38.4–76.8)
Platelets: 219 10*3/uL (ref 145–400)
RBC: 3.97 10*6/uL (ref 3.70–5.45)
RDW: 15.3 % — ABNORMAL HIGH (ref 11.2–14.5)
WBC: 5.6 10*3/uL (ref 3.9–10.3)
lymph#: 1.7 10*3/uL (ref 0.9–3.3)

## 2014-12-02 MED ORDER — DARBEPOETIN ALFA 200 MCG/0.4ML IJ SOSY
200.0000 ug | PREFILLED_SYRINGE | Freq: Once | INTRAMUSCULAR | Status: AC
  Administered 2014-12-02: 200 ug via SUBCUTANEOUS
  Filled 2014-12-02: qty 0.4

## 2014-12-02 NOTE — Progress Notes (Signed)
[facility] OFFICE PROGRESS NOTE  Abigail Hams, MD SUMMARY OF HEMATOLOGIC HISTORY:  This is a patient that has been followed here 4 years for chronic anemia. In 2011, she had a bone marrow aspirate and biopsy that came back normal. The patient has been receiving Aranesp injection for chronic anemia up until 2014. It was subsequently discontinued due stability of the anemia. Starting 05/27/2014, darbepoetin was resumed at 200 g every other month to keep hemoglobin greater than 10 g INTERVAL HISTORY: Abigail Hoover 69 y.o. female returns for further follow-up. She feels well. The patient denies any recent signs or symptoms of bleeding such as spontaneous epistaxis, hematuria or hematochezia. She denies side effects or injection. Denies worsening hypertension. The patient plan to return to [place] in November and will not be returning back to [place] unti early February. I have reviewed the past medical history, past surgical history, social history and family history with the patient and they are unchanged from previous note.  ALLERGIES:  is allergic to ezetimibe and atorvastatin.  MEDICATIONS:  Current Outpatient Prescriptions  Medication Sig Dispense Refill  . aspirin 81 MG tablet Take 81 mg by mouth daily.      . Blood Glucose Monitoring Suppl (ONE TOUCH ULTRA 2) W/DEVICE KIT Use to check blood sugars 1 time per day 1 each 0  . CARAFATE 1 GM/10ML suspension     . Colesevelam HCl (WELCHOL) 3.75 G PACK USE ONE PACKET DAILY 90 packet 0  . dexlansoprazole (DEXILANT) 60 MG capsule Take 60 mg by mouth daily.      . furosemide (LASIX) 20 MG tablet Take 1 tablet (20 mg total) by mouth daily as needed. 30 tablet 0  . gabapentin (NEURONTIN) 600 MG tablet Take 1 tablet (600 mg total) by mouth 3 (three) times daily. 90 tablet 3  . GLYSET 50 MG tablet ONE TABLET BY MOUTH TWICE A DAY 180 tablet 1  . ibandronate (BONIVA) 150 MG tablet TAKE 1 TAB ONCE A MONTH IN THE AM ON AN  EMPTY STOMACH WITH FULL GLAS OF WATER STAY UPRIGHT 30 MIN 1 tablet 5  . JANUMET 50-500 MG per tablet TAKE 1 TABLET BY MOUTH 2 (TWO) TIMES DAILY WITH A MEAL. 90 tablet 1  . levothyroxine (SYNTHROID, LEVOTHROID) 88 MCG tablet Take 1 tablet (88 mcg total) by mouth daily. 90 tablet 1  . losartan (COZAAR) 50 MG tablet Take 50 mg by mouth daily.  3  . metoprolol succinate (TOPROL-XL) 100 MG 24 hr tablet Take 100 mg by mouth daily. Take with or immediately following a meal. Patient takes 1 1/2 tablets per day    . MYRBETRIQ 25 MG TB24 Take 1 tablet by mouth Daily.    . pentosan polysulfate (ELMIRON) 100 MG capsule Take 100 mg by mouth 3 (three) times daily before meals.     Marland Kitchen PROCTOSOL HC 2.5 % rectal cream      No current facility-administered medications for this visit.     REVIEW OF SYSTEMS:   Constitutional: Denies fevers, chills or night sweats Eyes: Denies blurriness of vision Ears, nose, mouth, throat, and face: Denies mucositis or sore throat Respiratory: Denies cough, dyspnea or wheezes Cardiovascular: Denies palpitation, chest discomfort or lower extremity swelling Gastrointestinal:  Denies nausea, heartburn or change in bowel habits Skin: Denies abnormal skin rashes Lymphatics: Denies new lymphadenopathy or easy bruising Neurological:Denies numbness, tingling or new weaknesses Behavioral/Psych: Mood is stable, no new changes  All other systems were reviewed with the patient and  are negative.  PHYSICAL EXAMINATION: ECOG PERFORMANCE STATUS: 0 - Asymptomatic  Filed Vitals:   12/02/14 1454  BP: 127/72  Pulse: 63  Temp: 97.8 F (36.6 C)  Resp: 18   Filed Weights   12/02/14 1454  Weight: 111 lb 8 oz (50.576 kg)    GENERAL:alert, no distress and comfortable SKIN: skin color, texture, turgor are normal, no rashes or significant lesions EYES: normal, Conjunctiva are pink and non-injected, sclera clear Musculoskeletal:no cyanosis of digits and no clubbing  NEURO: alert &  oriented x 3 with fluent speech, no focal motor/sensory deficits  LABORATORY DATA:  I have reviewed the data as listed Results for orders placed or performed in visit on 12/02/14 (from the past 48 hour(s))  CBC with Differential/Platelet     Status: Abnormal   Collection Time: 12/02/14  2:41 PM  Result Value Ref Range   WBC 5.6 3.9 - 10.3 10e3/uL   NEUT# 2.8 1.5 - 6.5 10e3/uL   HGB 10.2 (L) 11.6 - 15.9 g/dL   HCT 32.1 (L) 34.8 - 46.6 %   Platelets 219 145 - 400 10e3/uL   MCV 80.9 79.5 - 101.0 fL   MCH 25.7 25.1 - 34.0 pg   MCHC 31.7 31.5 - 36.0 g/dL   RBC 3.97 3.70 - 5.45 10e6/uL   RDW 15.3 (H) 11.2 - 14.5 %   lymph# 1.7 0.9 - 3.3 10e3/uL   MONO# 0.3 0.1 - 0.9 10e3/uL   Eosinophils Absolute 0.8 (H) 0.0 - 0.5 10e3/uL   Basophils Absolute 0.0 0.0 - 0.1 10e3/uL   NEUT% 49.8 38.4 - 76.8 %   LYMPH% 30.2 14.0 - 49.7 %   MONO% 6.1 0.0 - 14.0 %   EOS% 13.4 (H) 0.0 - 7.0 %   BASO% 0.5 0.0 - 2.0 %    Lab Results  Component Value Date   WBC 5.6 12/02/2014   HGB 10.2* 12/02/2014   HCT 32.1* 12/02/2014   MCV 80.9 12/02/2014   PLT 219 12/02/2014    ASSESSMENT & PLAN:  Anemia, chronic disease This is likely anemia of chronic disease. The patient denies recent history of bleeding such as epistaxis, hematuria or hematochezia.  She is responding very well to darbepoetin. The goal is to keep hemoglobin close to 11 g. She has been skipping doses. I will schedule darbepoetin every other month. Beginning of November, she planned to return to [place] for 3-4 months vacation. She will call me once she returns for further follow-up. I gave her a letter to take home with her today so that the physicians in [place] is aware of the treatment she is getting.    All questions were answered. The patient knows to call the clinic with any problems, questions or concerns. No barriers to learning was detected.  I spent 15 minutes counseling the patient face to face. The total time spent in the  appointment was 20 minutes and more than 50% was on counseling.     [facility], Carleena Mires, MD 9/9/20164:04 PM

## 2014-12-02 NOTE — Assessment & Plan Note (Signed)
This is likely anemia of chronic disease. The patient denies recent history of bleeding such as epistaxis, hematuria or hematochezia.  She is responding very well to darbepoetin. The goal is to keep hemoglobin close to 11 g. She has been skipping doses. I will schedule darbepoetin every other month. Beginning of November, she planned to return to [place] for 3-4 months vacation. She will call me once she returns for further follow-up. I gave her a letter to take home with her today so that the physicians in [place] is aware of the treatment she is getting.

## 2014-12-05 ENCOUNTER — Telehealth: Payer: Self-pay | Admitting: Hematology and Oncology

## 2014-12-05 NOTE — Telephone Encounter (Signed)
lvm for pt regarding to SEpt appt.

## 2014-12-07 ENCOUNTER — Other Ambulatory Visit: Payer: Self-pay | Admitting: *Deleted

## 2014-12-07 ENCOUNTER — Other Ambulatory Visit (INDEPENDENT_AMBULATORY_CARE_PROVIDER_SITE_OTHER): Payer: Medicare Other

## 2014-12-07 DIAGNOSIS — E038 Other specified hypothyroidism: Secondary | ICD-10-CM

## 2014-12-07 DIAGNOSIS — IMO0002 Reserved for concepts with insufficient information to code with codable children: Secondary | ICD-10-CM

## 2014-12-07 DIAGNOSIS — E039 Hypothyroidism, unspecified: Secondary | ICD-10-CM

## 2014-12-07 DIAGNOSIS — E1165 Type 2 diabetes mellitus with hyperglycemia: Secondary | ICD-10-CM

## 2014-12-07 DIAGNOSIS — E119 Type 2 diabetes mellitus without complications: Secondary | ICD-10-CM

## 2014-12-07 LAB — HEMOGLOBIN A1C: Hgb A1c MFr Bld: 6.3 % (ref 4.6–6.5)

## 2014-12-07 LAB — COMPREHENSIVE METABOLIC PANEL
ALT: 29 U/L (ref 0–35)
AST: 24 U/L (ref 0–37)
Albumin: 3.8 g/dL (ref 3.5–5.2)
Alkaline Phosphatase: 76 U/L (ref 39–117)
BUN: 20 mg/dL (ref 6–23)
CO2: 30 mEq/L (ref 19–32)
Calcium: 9.5 mg/dL (ref 8.4–10.5)
Chloride: 97 mEq/L (ref 96–112)
Creatinine, Ser: 1.25 mg/dL — ABNORMAL HIGH (ref 0.40–1.20)
GFR: 44.82 mL/min — ABNORMAL LOW (ref >60.00)
Glucose, Bld: 205 mg/dL — ABNORMAL HIGH (ref 70–99)
Potassium: 4.4 mEq/L (ref 3.5–5.1)
Sodium: 133 mEq/L — ABNORMAL LOW (ref 135–145)
Total Bilirubin: 0.3 mg/dL (ref 0.2–1.2)
Total Protein: 6.2 g/dL (ref 6.0–8.3)

## 2014-12-07 LAB — URINALYSIS, ROUTINE W REFLEX MICROSCOPIC
Bilirubin Urine: NEGATIVE
Hgb urine dipstick: NEGATIVE
Ketones, ur: NEGATIVE
Leukocytes, UA: NEGATIVE
Nitrite: NEGATIVE
RBC / HPF: NONE SEEN (ref 0–?)
Specific Gravity, Urine: 1.01 (ref 1.000–1.030)
Total Protein, Urine: NEGATIVE
Urine Glucose: NEGATIVE
Urobilinogen, UA: 0.2 (ref 0.0–1.0)
pH: 6.5 (ref 5.0–8.0)

## 2014-12-07 LAB — T4, FREE: Free T4: 1.69 ng/dL — ABNORMAL HIGH (ref 0.60–1.60)

## 2014-12-07 LAB — TSH: TSH: 0.97 u[IU]/mL (ref 0.35–4.50)

## 2014-12-08 ENCOUNTER — Telehealth: Payer: Self-pay | Admitting: Hematology and Oncology

## 2014-12-08 LAB — T4, FREE: Free T4: 2.13 ng/dL — ABNORMAL HIGH (ref 0.82–1.77)

## 2014-12-08 LAB — FRUCTOSAMINE: Fructosamine: 230 umol/L (ref 0–285)

## 2014-12-08 LAB — SPECIMEN STATUS REPORT

## 2014-12-08 LAB — TSH: TSH: 1.09 u[IU]/mL (ref 0.450–4.500)

## 2014-12-08 NOTE — Telephone Encounter (Signed)
pt called to correct error on sched....done....pt aware of new d.t

## 2014-12-12 ENCOUNTER — Encounter: Payer: Self-pay | Admitting: Endocrinology

## 2014-12-12 ENCOUNTER — Ambulatory Visit (INDEPENDENT_AMBULATORY_CARE_PROVIDER_SITE_OTHER): Payer: Medicare Other | Admitting: Endocrinology

## 2014-12-12 VITALS — BP 120/80 | HR 68 | Temp 98.7°F | Ht 65.0 in | Wt 111.0 lb

## 2014-12-12 DIAGNOSIS — E7800 Pure hypercholesterolemia, unspecified: Secondary | ICD-10-CM

## 2014-12-12 DIAGNOSIS — I10 Essential (primary) hypertension: Secondary | ICD-10-CM

## 2014-12-12 DIAGNOSIS — E038 Other specified hypothyroidism: Secondary | ICD-10-CM

## 2014-12-12 DIAGNOSIS — E119 Type 2 diabetes mellitus without complications: Secondary | ICD-10-CM

## 2014-12-12 DIAGNOSIS — N182 Chronic kidney disease, stage 2 (mild): Secondary | ICD-10-CM | POA: Diagnosis not present

## 2014-12-12 MED ORDER — PRAVASTATIN SODIUM 20 MG PO TABS: 20.0000 mg | ORAL_TABLET | Freq: Every day | ORAL | Status: DC

## 2014-12-12 NOTE — Progress Notes (Signed)
Patient ID: Abigail Hoover, female   DOB: [birth year], 69 y.o.   MRN: [id number]    Reason for Appointment:  follow-up of diabetes  History of Present Illness   Diagnosis: Type 2 DIABETES MELITUS, long-standing  She has had mild diabetes for several years but has required multiple drugs for control Usually has had postprandial hyperglycemia, this has been better controlled with adding Glyset to her main meals Has never been on insulin or a GLP-1 drug Actos had been stopped previously because of tendency to edema  Recent history: Her A1c is consistent in the upper normal range She is on a reduced dose of metformin because of some tendency to low sugars with higher doses She does not check blood sugars consistently after her breakfast and her lab glucose was over 200 after breakfast She has been previously told to add protein at breakfast but she is still eating mostly bread and vegetables in the morning Again has not done any walking for exercise despite reminders, does not like to go outside to walk She does try to take her Glyset right before eating breakfast and dinner and she does not think she is eating lunch recently   Oral hypoglycemic drugs:   Glyset 50 mg before breakfast and dinner, Janumet 50/ 500 twice a day, WelChol        Side effects from medications: None Proper timing of medications in relation to meals: Yes.          Monitors blood glucose: 1-2 times a day.    Glucometer: One Touch       Blood Glucose readings from download:   Mean values apply above for all meters except median for One Touch  PRE-MEAL Fasting Lunch Dinner  PCS  Overall  Glucose range:  72-119   90, 176    83-205    Mean/median:     110   99    Meals: 2- 3 meals per day.  eating small breakfast but mostly with carbohydrates    Physical activity: exercise: No programmed activity, she says she is active with work             Dietician visit: Most recent: Unknown       Complications: are  mild symptoms of neuropathy, no nephropathy or retinopathy   Wt Readings from Last 3 Encounters:  12/12/14 111 lb (50.349 kg)  12/02/14 111 lb 8 oz (50.576 kg)  05/27/14 113 lb (51.256 kg)   Lab Results  Component Value Date   HGBA1C 6.3 12/07/2014   HGBA1C 6.3 05/13/2014   HGBA1C 6.0 12/15/2013   Lab Results  Component Value Date   MICROALBUR 1.7 11/05/2012   LDLCALC 61 05/13/2014   CREATININE 1.25* 12/07/2014    Microalbumin/creatinine ratio on 02/18/14 = 37  Lab on 12/07/2014  Component Date Value Ref Range Status  . Fructosamine 12/07/2014 230  0 - 285 umol/L Final   Comment: Published reference interval for apparently healthy subjects between age 65 and 57 is 17 - 285 umol/L and in a poorly controlled diabetic population is 228 - 563 umol/L with a mean of 396 umol/L.   Marland Kitchen Free T4 12/07/2014 2.13* 0.82 - 1.77 ng/dL Final  . TSH 12/07/2014 1.090  0.450 - 4.500 uIU/mL Final  . Hgb A1c MFr Bld 12/07/2014 6.3  4.6 - 6.5 % Final   Glycemic Control Guidelines for People with Diabetes:Non Diabetic:  <6%Goal of Therapy: <7%Additional Action Suggested:  >8%   . Sodium 12/07/2014 133* 135 -  145 mEq/L Final  . Potassium 12/07/2014 4.4  3.5 - 5.1 mEq/L Final  . Chloride 12/07/2014 97  96 - 112 mEq/L Final  . CO2 12/07/2014 30  19 - 32 mEq/L Final  . Glucose, Bld 12/07/2014 205* 70 - 99 mg/dL Final  . BUN 12/07/2014 20  6 - 23 mg/dL Final  . Creatinine, Ser 12/07/2014 1.25* 0.40 - 1.20 mg/dL Final  . Total Bilirubin 12/07/2014 0.3  0.2 - 1.2 mg/dL Final  . Alkaline Phosphatase 12/07/2014 76  39 - 117 U/L Final  . AST 12/07/2014 24  0 - 37 U/L Final  . ALT 12/07/2014 29  0 - 35 U/L Final  . Total Protein 12/07/2014 6.2  6.0 - 8.3 g/dL Final  . Albumin 12/07/2014 3.8  3.5 - 5.2 g/dL Final  . Calcium 12/07/2014 9.5  8.4 - 10.5 mg/dL Final  . GFR 12/07/2014 44.82* >60.00 mL/min Final  . Color, Urine 12/07/2014 YELLOW  Yellow;Lt. Yellow Final  . APPearance 12/07/2014 CLEAR  Clear  Final  . Specific Gravity, Urine 12/07/2014 1.010  1.000-1.030 Final  . pH 12/07/2014 6.5  5.0 - 8.0 Final  . Total Protein, Urine 12/07/2014 NEGATIVE  Negative Final  . Urine Glucose 12/07/2014 NEGATIVE  Negative Final  . Ketones, ur 12/07/2014 NEGATIVE  Negative Final  . Bilirubin Urine 12/07/2014 NEGATIVE  Negative Final  . Hgb urine dipstick 12/07/2014 NEGATIVE  Negative Final  . Urobilinogen, UA 12/07/2014 0.2  0.0 - 1.0 Final  . Leukocytes, UA 12/07/2014 NEGATIVE  Negative Final  . Nitrite 12/07/2014 NEGATIVE  Negative Final  . WBC, UA 12/07/2014 0-2/hpf  0-2/hpf Final  . RBC / HPF 12/07/2014 none seen  0-2/hpf Final  . Squamous Epithelial / LPF 12/07/2014 Rare(0-4/hpf)  Rare(0-4/hpf) Final  . Free T4 12/07/2014 1.69* 0.60 - 1.60 ng/dL Final  . TSH 12/07/2014 0.97  0.35 - 4.50 uIU/mL Final  . specimen status report 12/07/2014 Comment   Preliminary   Comment: Ambiguous Test Order Ambiguous Test Order          Medication List       This list is accurate as of: 12/12/14 11:59 PM.  Always use your most recent med list.               aspirin 81 MG tablet  Take 81 mg by mouth daily.     CARAFATE 1 GM/10ML suspension  Generic drug:  sucralfate     DEXILANT 60 MG capsule  Generic drug:  dexlansoprazole  Take 60 mg by mouth daily.     furosemide 20 MG tablet  Commonly known as:  LASIX  Take 1 tablet (20 mg total) by mouth daily as needed.     gabapentin 600 MG tablet  Commonly known as:  NEURONTIN  Take 1 tablet (600 mg total) by mouth 3 (three) times daily.     GLYSET 50 MG tablet  Generic drug:  miglitol  ONE TABLET BY MOUTH TWICE A DAY     ibandronate 150 MG tablet  Commonly known as:  BONIVA  TAKE 1 TAB ONCE A MONTH IN THE AM ON AN EMPTY STOMACH WITH FULL GLAS OF WATER STAY UPRIGHT 30 MIN     JANUMET 50-500 MG per tablet  Generic drug:  sitaGLIPtin-metformin  TAKE 1 TABLET BY MOUTH 2 (TWO) TIMES DAILY WITH A MEAL.     levothyroxine 88 MCG tablet    Commonly known as:  SYNTHROID, LEVOTHROID  Take 1 tablet (88 mcg total) by mouth daily.  losartan 50 MG tablet  Commonly known as:  COZAAR  Take 50 mg by mouth daily.     metoprolol succinate 100 MG 24 hr tablet  Commonly known as:  TOPROL-XL  Take 100 mg by mouth daily. Take with or immediately following a meal. Patient takes 1 1/2 tablets per day     MYRBETRIQ 25 MG Tb24 tablet  Generic drug:  mirabegron ER  Take 1 tablet by mouth Daily.     ONE TOUCH ULTRA 2 W/DEVICE Kit  Use to check blood sugars 1 time per day     pentosan polysulfate 100 MG capsule  Commonly known as:  ELMIRON  Take 100 mg by mouth 3 (three) times daily before meals.     pravastatin 20 MG tablet  Commonly known as:  PRAVACHOL  Take 1 tablet (20 mg total) by mouth daily.     PROCTOSOL HC 2.5 % rectal cream  Generic drug:  hydrocortisone        Allergies:  Allergies  Allergen Reactions  . Ezetimibe Itching  . Atorvastatin     REACTION: rash    Past Medical History  Diagnosis Date  . Diabetes mellitus   . Hypertension   . Thyroid disease   . Arthritis   . Anemia   . GERD (gastroesophageal reflux disease)   . Hypercholesterolemia   . Bladder irritation   . Iron deficiency anemia, unspecified   . Hypothyroidism   . Anxiety   . Psoriasis   . Deficiency anemia 05/27/2014    Past Surgical History  Procedure Laterality Date  . Abdominal hysterectomy      TAH  . Cervical disc surgery    . Cataract extraction    . Breast biopsy      Left breast biopsy benign lesion  . Colonoscopy  2013    neg. next 2023.   Marland Kitchen Eye surgery    . Breast biopsy  03/11/2012    Procedure: BREAST BIOPSY;  Surgeon: Odis Hollingshead, MD;  Location: [facility];  Service: General;  Laterality: Left;  remove left breast mass    Family History  Problem Relation Age of Onset  . Breast cancer Sister   . Cancer Sister     breast cancer  . Heart disease Mother     heart attack  . Stroke Father     brain  hemorrhage  . Diabetes Brother   . Cancer Brother     Social History:  reports that she has never smoked. She has never used smokeless tobacco. She reports that she does not drink alcohol or use illicit drugs.  Review of Systems:  HYPERTENSION:  this is well-controlled with losartan along with metoprolol 100 mg, one and a half tablets daily.   She has had follow-up with cardiologist.  However her creatinine appears to be higher She is also taking Lasix for unknown reasons, has not had edema for some time  HYPERLIPIDEMIA: The lipid abnormality consists of elevated LDL which is mild and well controlled with WelChol alone. She thinks she had a rash from Lipitor but has not tried any other statin drugs  Lab Results  Component Value Date   CHOL 149 05/13/2014   HDL 72.80 05/13/2014   LDLCALC 61 05/13/2014   TRIG 76.0 05/13/2014   CHOLHDL 2 05/13/2014    She has a long history of primary hypothyroidism which has been fairly stable and TSH consistently normal with 88 g She takes this on an empty stomach early in the  morning and will take her WelChol about 4-5 hours later  TSH has been stable on current regimen  Lab Results  Component Value Date   TSH 0.97 12/07/2014   TSH 1.090 12/07/2014   TSH 2.220 04/26/2013   FREET4 1.69* 12/07/2014   FREET4 2.13* 12/07/2014   FREET4 1.38 03/08/2013    NEUROPATHY:  She does have occasional numbness in her feet and some burning but not recently, is on gabapentin from urologist for other reasons  History of iron deficiency anemia; currently not taking iron and was getting  Aranesp and has not had a follow-up scheduled until June but her hemoglobin is trending lower  Lab Results  Component Value Date   WBC 5.6 12/02/2014   HGB 10.2* 12/02/2014   HCT 32.1* 12/02/2014   MCV 80.9 12/02/2014   PLT 219 12/02/2014       Diabetic foot exam in 9/15: No decrease in monofilament sensation.  Reduced pulses left foot  OSTEOPENIA: Her T score   done by gynecologist was -2.1, currently on Boniva started in 01/2014    Examination:   BP 120/80 mmHg  Pulse 68  Temp(Src) 98.7 F (37.1 C) (Oral)  Ht _0  (1.651 m)  Wt 111 lb (50.349 kg)  BMI 18.47 kg/m2  SpO2 97%  Body mass index is 18.47 kg/(m^2).     No pedal edema. She has diffuse hyperpigmentation of the skin  ASSESSMENT/ PLAN:   Diabetes type 2   The patient's diabetes control is  fairly good with her multidrug regimen although she does have periodically high readings including on the lab; this is likely to be from getting currently high carbohydrate diet and not adding protein consistently Discussed sources of protein for vegetarian diet especially at breakfast  Otherwise she will continue her 4 drug regimen  RENAL dysfunction: Creatinine is higher than usual and this may be a combination of taking high dose gabapentin along with continuing to take Lasix without any edema  HYPERLIPIDEMIA: Discussed benefits of statin drugs and because of her ethnic status and diabetes/hypertension she has multiple risk factors and needs a statin drug as per recommendations of the latest guidelines This was discussed She needs to try pravastatin 20 mg to start with, discussed that this should not cause a rash like Lipitor She can stop WelChol  Edema: This is not present and because of her creatinine going up will reduce her Lasix to every other day Also consider reducing gabapentin because of her mild renal dysfunction  Hypothyroidism: Has normal TSH on current dose of 88 g  Patient Instructions  Add protein in am like a cheese yogurt or peanut butter  Lasix every 2 days  Stop welchol   Counseling time on subjects discussed above is over 50% of today's 25 minute visit   Bayle Calvo 12/13/2014, 10:45 AM

## 2014-12-12 NOTE — Patient Instructions (Addendum)
Add protein in am like a cheese yogurt or peanut butter  Lasix every 2 days  Stop welchol

## 2014-12-13 ENCOUNTER — Telehealth: Payer: Self-pay | Admitting: Endocrinology

## 2014-12-13 NOTE — Telephone Encounter (Signed)
Patient ask if you could send labs results to Clent Demark MD cardiologist Fax # [phone]

## 2014-12-13 NOTE — Telephone Encounter (Signed)
Information faxed to [phone]

## 2014-12-14 ENCOUNTER — Other Ambulatory Visit: Payer: Medicare Other

## 2014-12-14 ENCOUNTER — Ambulatory Visit: Payer: Medicare Other

## 2014-12-15 DIAGNOSIS — M545 Low back pain: Secondary | ICD-10-CM | POA: Diagnosis not present

## 2014-12-15 DIAGNOSIS — M25551 Pain in right hip: Secondary | ICD-10-CM | POA: Diagnosis not present

## 2014-12-21 ENCOUNTER — Other Ambulatory Visit: Payer: Self-pay | Admitting: Endocrinology

## 2014-12-23 ENCOUNTER — Telehealth: Payer: Self-pay | Admitting: Endocrinology

## 2014-12-23 ENCOUNTER — Other Ambulatory Visit: Payer: Self-pay | Admitting: *Deleted

## 2014-12-23 MED ORDER — FUROSEMIDE 20 MG PO TABS: 20.0000 mg | ORAL_TABLET | Freq: Every day | ORAL | Status: DC | PRN

## 2014-12-23 NOTE — Telephone Encounter (Signed)
Can you please advise in Dr. Ronnie Derby absence?  Please send to [facility] or [facility] [facility]

## 2014-12-23 NOTE — Telephone Encounter (Signed)
Pt takes cortisone pill, when she is on it the BS goes high, can she take more insulin to compensate

## 2014-12-23 NOTE — Telephone Encounter (Signed)
I do not see insulin on her medication list ... Can you please clarify?

## 2014-12-23 NOTE — Telephone Encounter (Signed)
Can you please help with this?  Thank you.

## 2014-12-23 NOTE — Telephone Encounter (Signed)
I requested the pt to call back to verify if she is taking insulin. Pt was advised via voicemail Dr. Dwyane Dee is currently out of the office and will return on 12/26/2014.

## 2014-12-26 NOTE — Telephone Encounter (Signed)
Rhonda please see not below. I could not get in touch with the patient on 12/23/2014.

## 2014-12-28 ENCOUNTER — Ambulatory Visit (INDEPENDENT_AMBULATORY_CARE_PROVIDER_SITE_OTHER): Payer: Medicare Other | Admitting: Ophthalmology

## 2014-12-28 DIAGNOSIS — E11319 Type 2 diabetes mellitus with unspecified diabetic retinopathy without macular edema: Secondary | ICD-10-CM

## 2014-12-28 DIAGNOSIS — H43813 Vitreous degeneration, bilateral: Secondary | ICD-10-CM

## 2014-12-28 DIAGNOSIS — I10 Essential (primary) hypertension: Secondary | ICD-10-CM

## 2014-12-28 DIAGNOSIS — E113293 Type 2 diabetes mellitus with mild nonproliferative diabetic retinopathy without macular edema, bilateral: Secondary | ICD-10-CM

## 2014-12-28 DIAGNOSIS — H35033 Hypertensive retinopathy, bilateral: Secondary | ICD-10-CM

## 2015-01-02 ENCOUNTER — Encounter (HOSPITAL_COMMUNITY): Payer: Self-pay | Admitting: *Deleted

## 2015-01-02 ENCOUNTER — Inpatient Hospital Stay (HOSPITAL_COMMUNITY)
Admission: EM | Admit: 2015-01-02 | Discharge: 2015-01-04 | DRG: 287 | Disposition: A | Discharge status: Home / Self Care | Payer: Medicare Other | Attending: Cardiology | Admitting: Cardiology

## 2015-01-02 ENCOUNTER — Ambulatory Visit (HOSPITAL_COMMUNITY): Admit: 2015-01-02 | Payer: Self-pay | Admitting: Cardiology

## 2015-01-02 ENCOUNTER — Emergency Department (HOSPITAL_COMMUNITY): Payer: Medicare Other

## 2015-01-02 ENCOUNTER — Encounter (HOSPITAL_COMMUNITY)
Admission: EM | Disposition: A | Discharge status: Home / Self Care | Payer: Self-pay | Source: Home / Self Care | Attending: Cardiology

## 2015-01-02 DIAGNOSIS — I25111 Atherosclerotic heart disease of native coronary artery with angina pectoris with documented spasm: Principal | ICD-10-CM | POA: Diagnosis present

## 2015-01-02 DIAGNOSIS — D649 Anemia, unspecified: Secondary | ICD-10-CM | POA: Diagnosis not present

## 2015-01-02 DIAGNOSIS — E039 Hypothyroidism, unspecified: Secondary | ICD-10-CM | POA: Diagnosis not present

## 2015-01-02 DIAGNOSIS — L409 Psoriasis, unspecified: Secondary | ICD-10-CM | POA: Diagnosis present

## 2015-01-02 DIAGNOSIS — E119 Type 2 diabetes mellitus without complications: Secondary | ICD-10-CM | POA: Diagnosis not present

## 2015-01-02 DIAGNOSIS — R030 Elevated blood-pressure reading, without diagnosis of hypertension: Secondary | ICD-10-CM | POA: Diagnosis not present

## 2015-01-02 DIAGNOSIS — M1990 Unspecified osteoarthritis, unspecified site: Secondary | ICD-10-CM | POA: Diagnosis present

## 2015-01-02 DIAGNOSIS — I214 Non-ST elevation (NSTEMI) myocardial infarction: Secondary | ICD-10-CM | POA: Diagnosis not present

## 2015-01-02 DIAGNOSIS — K219 Gastro-esophageal reflux disease without esophagitis: Secondary | ICD-10-CM | POA: Diagnosis present

## 2015-01-02 DIAGNOSIS — I2510 Atherosclerotic heart disease of native coronary artery without angina pectoris: Secondary | ICD-10-CM | POA: Diagnosis not present

## 2015-01-02 DIAGNOSIS — I249 Acute ischemic heart disease, unspecified: Secondary | ICD-10-CM | POA: Diagnosis present

## 2015-01-02 DIAGNOSIS — I10 Essential (primary) hypertension: Secondary | ICD-10-CM | POA: Diagnosis not present

## 2015-01-02 DIAGNOSIS — Z7982 Long term (current) use of aspirin: Secondary | ICD-10-CM

## 2015-01-02 DIAGNOSIS — E785 Hyperlipidemia, unspecified: Secondary | ICD-10-CM | POA: Diagnosis not present

## 2015-01-02 DIAGNOSIS — E871 Hypo-osmolality and hyponatremia: Secondary | ICD-10-CM

## 2015-01-02 DIAGNOSIS — E7800 Pure hypercholesterolemia, unspecified: Secondary | ICD-10-CM | POA: Diagnosis not present

## 2015-01-02 DIAGNOSIS — R5383 Other fatigue: Secondary | ICD-10-CM | POA: Diagnosis not present

## 2015-01-02 DIAGNOSIS — R079 Chest pain, unspecified: Secondary | ICD-10-CM | POA: Diagnosis not present

## 2015-01-02 HISTORY — PX: CARDIAC CATHETERIZATION: SHX172

## 2015-01-02 LAB — CBC WITH DIFFERENTIAL/PLATELET
Basophils Absolute: 0.1 10*3/uL (ref 0.0–0.1)
Basophils Relative: 1 %
Eosinophils Absolute: 0.1 10*3/uL (ref 0.0–0.7)
Eosinophils Relative: 0 %
HCT: 41 % (ref 36.0–46.0)
Hemoglobin: 13.2 g/dL (ref 12.0–15.0)
Lymphocytes Relative: 7 %
Lymphs Abs: 1.3 10*3/uL (ref 0.7–4.0)
MCH: 25.8 pg — ABNORMAL LOW (ref 26.0–34.0)
MCHC: 32.2 g/dL (ref 30.0–36.0)
MCV: 80.1 fL (ref 78.0–100.0)
Monocytes Absolute: 1.3 10*3/uL — ABNORMAL HIGH (ref 0.1–1.0)
Monocytes Relative: 7 %
Neutro Abs: 16.5 10*3/uL — ABNORMAL HIGH (ref 1.7–7.7)
Neutrophils Relative %: 86 %
Platelets: 287 10*3/uL (ref 150–400)
RBC: 5.12 MIL/uL — ABNORMAL HIGH (ref 3.87–5.11)
RDW: 14.8 % (ref 11.5–15.5)
WBC: 19.3 10*3/uL — ABNORMAL HIGH (ref 4.0–10.5)

## 2015-01-02 LAB — HEPARIN LEVEL (UNFRACTIONATED): Heparin Unfractionated: 1.06 IU/mL — ABNORMAL HIGH (ref 0.30–0.70)

## 2015-01-02 LAB — BASIC METABOLIC PANEL
Anion gap: 8 (ref 5–15)
BUN: 23 mg/dL — ABNORMAL HIGH (ref 6–20)
CO2: 25 mmol/L (ref 22–32)
Calcium: 9.5 mg/dL (ref 8.9–10.3)
Chloride: 94 mmol/L — ABNORMAL LOW (ref 101–111)
Creatinine, Ser: 0.99 mg/dL (ref 0.44–1.00)
GFR calc Af Amer: >60 mL/min (ref >60)
GFR calc non Af Amer: 56 mL/min — ABNORMAL LOW (ref >60)
Glucose, Bld: 292 mg/dL — ABNORMAL HIGH (ref 65–99)
Potassium: 4.9 mmol/L (ref 3.5–5.1)
Sodium: 127 mmol/L — ABNORMAL LOW (ref 135–145)

## 2015-01-02 LAB — CBC
HCT: 43.4 % (ref 36.0–46.0)
Hemoglobin: 13.9 g/dL (ref 12.0–15.0)
MCH: 25.6 pg — ABNORMAL LOW (ref 26.0–34.0)
MCHC: 32 g/dL (ref 30.0–36.0)
MCV: 80.1 fL (ref 78.0–100.0)
Platelets: 243 10*3/uL (ref 150–400)
RBC: 5.42 MIL/uL — ABNORMAL HIGH (ref 3.87–5.11)
RDW: 15 % (ref 11.5–15.5)
WBC: 11.8 10*3/uL — ABNORMAL HIGH (ref 4.0–10.5)

## 2015-01-02 LAB — COMPREHENSIVE METABOLIC PANEL
ALT: 32 U/L (ref 14–54)
AST: 185 U/L — ABNORMAL HIGH (ref 15–41)
Albumin: 3.3 g/dL — ABNORMAL LOW (ref 3.5–5.0)
Alkaline Phosphatase: 60 U/L (ref 38–126)
Anion gap: 10 (ref 5–15)
BUN: 21 mg/dL — ABNORMAL HIGH (ref 6–20)
CO2: 27 mmol/L (ref 22–32)
Calcium: 8.9 mg/dL (ref 8.9–10.3)
Chloride: 87 mmol/L — ABNORMAL LOW (ref 101–111)
Creatinine, Ser: 1.02 mg/dL — ABNORMAL HIGH (ref 0.44–1.00)
GFR calc Af Amer: >60 mL/min (ref >60)
GFR calc non Af Amer: 54 mL/min — ABNORMAL LOW (ref >60)
Glucose, Bld: 229 mg/dL — ABNORMAL HIGH (ref 65–99)
Potassium: 4.9 mmol/L (ref 3.5–5.1)
Sodium: 124 mmol/L — ABNORMAL LOW (ref 135–145)
Total Bilirubin: 0.6 mg/dL (ref 0.3–1.2)
Total Protein: 5.5 g/dL — ABNORMAL LOW (ref 6.5–8.1)

## 2015-01-02 LAB — I-STAT TROPONIN, ED: Troponin i, poc: 16.12 ng/mL (ref 0.00–0.08)

## 2015-01-02 LAB — GLUCOSE, CAPILLARY
Glucose-Capillary: 235 mg/dL — ABNORMAL HIGH (ref 65–99)
Glucose-Capillary: 198 mg/dL — ABNORMAL HIGH (ref 65–99)

## 2015-01-02 LAB — D-DIMER, QUANTITATIVE (NOT AT ARMC): D-Dimer, Quant: <0.27 ug/mL-FEU (ref 0.00–0.48)

## 2015-01-02 LAB — TROPONIN I: Troponin I: 64 ng/mL (ref <0.031)

## 2015-01-02 SURGERY — LEFT HEART CATH AND CORONARY ANGIOGRAPHY
Anesthesia: LOCAL

## 2015-01-02 MED ORDER — ASPIRIN 81 MG PO TABS: 81.0000 mg | ORAL_TABLET | Freq: Every day | ORAL | Status: DC

## 2015-01-02 MED ORDER — BIVALIRUDIN 250 MG IV SOLR
INTRAVENOUS | Status: AC
  Filled 2015-01-02: qty 250

## 2015-01-02 MED ORDER — IOHEXOL 350 MG/ML SOLN
INTRAVENOUS | Status: DC | PRN
  Administered 2015-01-02: 60 mL via INTRACARDIAC

## 2015-01-02 MED ORDER — ASPIRIN EC 81 MG PO TBEC
81.0000 mg | DELAYED_RELEASE_TABLET | Freq: Every day | ORAL | Status: DC
  Administered 2015-01-03 – 2015-01-04 (×2): 81 mg via ORAL
  Filled 2015-01-02 (×2): qty 1

## 2015-01-02 MED ORDER — ASPIRIN 81 MG PO CHEW: 324.0000 mg | CHEWABLE_TABLET | ORAL | Status: DC

## 2015-01-02 MED ORDER — HYDRALAZINE HCL 20 MG/ML IJ SOLN
10.0000 mg | Freq: Once | INTRAMUSCULAR | Status: AC
Start: 2015-01-02 — End: 2015-01-02
  Administered 2015-01-02: 10 mg via INTRAVENOUS
  Filled 2015-01-02: qty 1

## 2015-01-02 MED ORDER — ACETAMINOPHEN 325 MG PO TABS: 650.0000 mg | ORAL_TABLET | ORAL | Status: DC | PRN

## 2015-01-02 MED ORDER — HEPARIN (PORCINE) IN NACL 2-0.9 UNIT/ML-% IJ SOLN
INTRAMUSCULAR | Status: AC
  Filled 2015-01-02: qty 1000

## 2015-01-02 MED ORDER — VITAMIN D 1000 UNITS PO TABS
2000.0000 [IU] | ORAL_TABLET | Freq: Every day | ORAL | Status: DC
  Administered 2015-01-03 – 2015-01-04 (×2): 2000 [IU] via ORAL
  Filled 2015-01-02 (×2): qty 2

## 2015-01-02 MED ORDER — METOPROLOL SUCCINATE ER 50 MG PO TB24
50.0000 mg | ORAL_TABLET | Freq: Every day | ORAL | Status: DC
  Administered 2015-01-03 – 2015-01-04 (×2): 50 mg via ORAL
  Filled 2015-01-02 (×2): qty 1

## 2015-01-02 MED ORDER — SODIUM CHLORIDE 0.9 % IJ SOLN: 3.0000 mL | Freq: Two times a day (BID) | INTRAMUSCULAR | Status: DC

## 2015-01-02 MED ORDER — NITROGLYCERIN 0.4 MG SL SUBL: 0.4000 mg | SUBLINGUAL_TABLET | SUBLINGUAL | Status: DC | PRN

## 2015-01-02 MED ORDER — HEPARIN (PORCINE) IN NACL 100-0.45 UNIT/ML-% IJ SOLN
700.0000 [IU]/h | INTRAMUSCULAR | Status: DC
  Administered 2015-01-02: 700 [IU]/h via INTRAVENOUS
  Filled 2015-01-02: qty 250

## 2015-01-02 MED ORDER — FENTANYL CITRATE (PF) 100 MCG/2ML IJ SOLN
INTRAMUSCULAR | Status: DC | PRN
  Administered 2015-01-02 (×2): 25 ug via INTRAVENOUS

## 2015-01-02 MED ORDER — LIDOCAINE HCL (PF) 1 % IJ SOLN
INTRAMUSCULAR | Status: AC
  Filled 2015-01-02: qty 30

## 2015-01-02 MED ORDER — ONDANSETRON HCL 4 MG/2ML IJ SOLN: 4.0000 mg | Freq: Four times a day (QID) | INTRAMUSCULAR | Status: DC | PRN

## 2015-01-02 MED ORDER — NITROGLYCERIN IN D5W 200-5 MCG/ML-% IV SOLN
10.0000 ug/min | INTRAVENOUS | Status: DC
  Administered 2015-01-02: 10 ug/min via INTRAVENOUS
  Filled 2015-01-02: qty 250

## 2015-01-02 MED ORDER — COLESEVELAM HCL 3.75 G PO PACK: 1.0000 | PACK | Freq: Every day | ORAL | Status: DC

## 2015-01-02 MED ORDER — MIDAZOLAM HCL 2 MG/2ML IJ SOLN
INTRAMUSCULAR | Status: DC | PRN
  Administered 2015-01-02 (×2): 1 mg via INTRAVENOUS

## 2015-01-02 MED ORDER — ASPIRIN 325 MG PO TABS
325.0000 mg | ORAL_TABLET | Freq: Once | ORAL | Status: AC
  Administered 2015-01-02: 325 mg via ORAL
  Filled 2015-01-02: qty 1

## 2015-01-02 MED ORDER — INSULIN ASPART 100 UNIT/ML ~~LOC~~ SOLN
0.0000 [IU] | Freq: Three times a day (TID) | SUBCUTANEOUS | Status: DC
  Administered 2015-01-02: 3 [IU] via SUBCUTANEOUS
  Administered 2015-01-03: 5 [IU] via SUBCUTANEOUS
  Administered 2015-01-03: 2 [IU] via SUBCUTANEOUS
  Administered 2015-01-03: 3 [IU] via SUBCUTANEOUS

## 2015-01-02 MED ORDER — SODIUM CHLORIDE 0.9 % IV SOLN
INTRAVENOUS | Status: AC
  Administered 2015-01-02: 19:00:00 via INTRAVENOUS

## 2015-01-02 MED ORDER — COLESEVELAM HCL 3.75 G PO PACK
1.0000 | PACK | Freq: Every day | ORAL | Status: DC
  Filled 2015-01-02 (×3): qty 1

## 2015-01-02 MED ORDER — LOSARTAN POTASSIUM 50 MG PO TABS
50.0000 mg | ORAL_TABLET | Freq: Every day | ORAL | Status: DC
  Administered 2015-01-03 – 2015-01-04 (×2): 50 mg via ORAL
  Filled 2015-01-02 (×2): qty 1

## 2015-01-02 MED ORDER — FENTANYL CITRATE (PF) 100 MCG/2ML IJ SOLN
INTRAMUSCULAR | Status: AC
  Filled 2015-01-02: qty 4

## 2015-01-02 MED ORDER — ASPIRIN 300 MG RE SUPP
300.0000 mg | RECTAL | Status: DC
  Filled 2015-01-02: qty 1

## 2015-01-02 MED ORDER — HEPARIN (PORCINE) IN NACL 2-0.9 UNIT/ML-% IJ SOLN
INTRAMUSCULAR | Status: DC | PRN
  Administered 2015-01-02: 17:00:00

## 2015-01-02 MED ORDER — GABAPENTIN 300 MG PO CAPS
300.0000 mg | ORAL_CAPSULE | Freq: Four times a day (QID) | ORAL | Status: DC
  Administered 2015-01-02 – 2015-01-04 (×6): 300 mg via ORAL
  Filled 2015-01-02 (×6): qty 1

## 2015-01-02 MED ORDER — AMLODIPINE BESYLATE 10 MG PO TABS
10.0000 mg | ORAL_TABLET | Freq: Every day | ORAL | Status: DC
  Administered 2015-01-03: 10 mg via ORAL
  Filled 2015-01-02 (×2): qty 1

## 2015-01-02 MED ORDER — VITAMIN B-12 1000 MCG PO TABS
1000.0000 ug | ORAL_TABLET | Freq: Every day | ORAL | Status: DC
  Administered 2015-01-03: 10:00:00 1000 ug via ORAL
  Filled 2015-01-02 (×3): qty 1

## 2015-01-02 MED ORDER — ISOSORBIDE MONONITRATE ER 30 MG PO TB24
30.0000 mg | ORAL_TABLET | Freq: Every day | ORAL | Status: DC
  Administered 2015-01-03: 10:00:00 30 mg via ORAL
  Filled 2015-01-02: qty 1

## 2015-01-02 MED ORDER — HEPARIN BOLUS VIA INFUSION
3000.0000 [IU] | Freq: Once | INTRAVENOUS | Status: AC
  Administered 2015-01-02: 3000 [IU] via INTRAVENOUS
  Filled 2015-01-02: qty 3000

## 2015-01-02 MED ORDER — SODIUM CHLORIDE 0.9 % IV SOLN: 250.0000 mL | INTRAVENOUS | Status: DC | PRN

## 2015-01-02 MED ORDER — LEVOTHYROXINE SODIUM 88 MCG PO TABS
88.0000 ug | ORAL_TABLET | Freq: Every day | ORAL | Status: DC
  Administered 2015-01-03 – 2015-01-04 (×2): 88 ug via ORAL
  Filled 2015-01-02 (×3): qty 1

## 2015-01-02 MED ORDER — PANTOPRAZOLE SODIUM 40 MG PO TBEC
40.0000 mg | DELAYED_RELEASE_TABLET | Freq: Every day | ORAL | Status: DC
  Administered 2015-01-03: 40 mg via ORAL
  Filled 2015-01-02: qty 1

## 2015-01-02 MED ORDER — SODIUM CHLORIDE 0.9 % IJ SOLN: 3.0000 mL | INTRAMUSCULAR | Status: DC | PRN

## 2015-01-02 MED ORDER — NITROGLYCERIN 1 MG/10 ML FOR IR/CATH LAB
INTRA_ARTERIAL | Status: AC
  Filled 2015-01-02: qty 10

## 2015-01-02 MED ORDER — ASPIRIN 81 MG PO CHEW: 81.0000 mg | CHEWABLE_TABLET | Freq: Every day | ORAL | Status: DC

## 2015-01-02 MED ORDER — MIDAZOLAM HCL 2 MG/2ML IJ SOLN
INTRAMUSCULAR | Status: AC
  Filled 2015-01-02: qty 4

## 2015-01-02 SURGICAL SUPPLY — 10 items
CATH INFINITI 5FR MULTPACK ANG (CATHETERS) ×2 IMPLANT
KIT HEART LEFT (KITS) ×2 IMPLANT
NDL SMART REG 18GX2-3/4 (NEEDLE) IMPLANT
NEEDLE SMART REG 18GX2-3/4 (NEEDLE) ×2 IMPLANT
PACK CARDIAC CATHETERIZATION (CUSTOM PROCEDURE TRAY) ×2 IMPLANT
SHEATH PINNACLE 5F 10CM (SHEATH) ×1 IMPLANT
SHEATH PINNACLE 6F 10CM (SHEATH) ×1 IMPLANT
SYR MEDRAD MARK V 150ML (SYRINGE) ×2 IMPLANT
TRANSDUCER W/STOPCOCK (MISCELLANEOUS) ×2 IMPLANT
WIRE EMERALD 3MM-J .035X150CM (WIRE) ×2 IMPLANT

## 2015-01-02 NOTE — ED Notes (Signed)
Abigail Dupont, MD at bedside.

## 2015-01-02 NOTE — ED Notes (Signed)
Pt transported to cath lab by RN.

## 2015-01-02 NOTE — ED Notes (Addendum)
Pt arrived by [facility]. Reports sudden onset of fatigue and diaphoresis, checked by at home and SBP >200. Reports taking all meds as prescribed. Denies cp or sob but does state she "feels like she has indigestion." ekg done on arrival.

## 2015-01-02 NOTE — ED Provider Notes (Signed)
CSN: [id number]     Arrival date & time 01/02/15  1307 History   First MD Initiated Contact with Patient 01/02/15 1339     Chief Complaint  Patient presents with  . Hypertension     (Consider location/radiation/quality/duration/timing/severity/associated sxs/prior Treatment) Patient is a 69 y.o. female presenting with general illness.  Illness Location:  Generalized Quality:  Fatigue, diaphoresis Severity:  Moderate Onset quality:  Sudden Duration:  1 day Timing:  Constant Progression:  Unchanged Chronicity:  New Context:  SBP 210 at home Relieved by:  Nothing Worsened by:  Nothing Associated symptoms: no abdominal pain, no chest pain, no nausea and no vomiting   Associated symptoms comment:      Past Medical History  Diagnosis Date  . Diabetes mellitus   . Hypertension   . Thyroid disease   . Arthritis   . Anemia   . GERD (gastroesophageal reflux disease)   . Hypercholesterolemia   . Bladder irritation   . Iron deficiency anemia, unspecified   . Hypothyroidism   . Anxiety   . Psoriasis   . Deficiency anemia 05/27/2014   Past Surgical History  Procedure Laterality Date  . Abdominal hysterectomy      TAH  . Cervical disc surgery    . Cataract extraction    . Breast biopsy      Left breast biopsy benign lesion  . Colonoscopy  2013    neg. next 2023.   [facility]land Kitchen Eye surgery    . Breast biopsy  03/11/2012    Procedure: BREAST BIOPSY;  Surgeon: Odis Hollingshead, MD;  Location: [place];  Service: General;  Laterality: Left;  remove left breast mass  . Cardiac catheterization N/A 01/02/2015    Procedure: Left Heart Cath and Coronary Angiography;  Surgeon: Charolette Forward, MD;  Location: [place] [facility];  Service: Cardiovascular;  Laterality: N/A;   Family History  Problem Relation Age of Onset  . Breast cancer Sister   . Cancer Sister     breast cancer  . Heart disease Mother     heart attack  . Stroke Father     brain hemorrhage  . Diabetes Brother   . Cancer  Brother    Social History  Substance Use Topics  . Smoking status: Never Smoker   . Smokeless tobacco: Never Used  . Alcohol Use: No   OB History    No data available     Review of Systems  Cardiovascular: Negative for chest pain.  Gastrointestinal: Negative for nausea, vomiting and abdominal pain.  All other systems reviewed and are negative.     Allergies  Ezetimibe and Atorvastatin  Home Medications   Prior to Admission medications   Medication Sig Start Date End Date Taking? Authorizing Provider  amLODipine (NORVASC) 10 MG tablet Take 10 mg by mouth daily.   Yes Historical Provider, MD  aspirin 81 MG tablet Take 81 mg by mouth daily.     Yes Historical Provider, MD  Cholecalciferol (VITAMIN D-3) 1000 UNITS CAPS Take 2 capsules by mouth daily.   Yes Historical Provider, MD  Colesevelam HCl [facility]) 3.75 G PACK Take 1 packet by mouth daily.   Yes Historical Provider, MD  dexlansoprazole (DEXILANT) 60 MG capsule Take 60 mg by mouth daily.     Yes Historical Provider, MD  furosemide (LASIX) 20 MG tablet Take 1 tablet (20 mg total) by mouth daily as needed. 12/23/14  Yes Elayne Snare, MD  gabapentin (NEURONTIN) 300 MG capsule Take 300 mg by  mouth 4 (four) times daily.   Yes Historical Provider, MD  GLYSET 50 MG tablet ONE TABLET BY MOUTH TWICE A DAY 08/24/14  Yes Elayne Snare, MD  ibandronate (BONIVA) 150 MG tablet TAKE 1 TAB ONCE A MONTH IN THE AM ON AN EMPTY STOMACH WITH FULL GLAS OF WATER STAY UPRIGHT 30 MIN 11/08/14  Yes Elayne Snare, MD  JANUMET 50-500 MG tablet TAKE 1 TABLET BY MOUTH 2 (TWO) TIMES DAILY WITH A MEAL. 12/22/14  Yes Elayne Snare, MD  levothyroxine (SYNTHROID, LEVOTHROID) 88 MCG tablet Take 1 tablet (88 mcg total) by mouth daily. 04/18/14  Yes Elayne Snare, MD  losartan (COZAAR) 50 MG tablet Take 50 mg by mouth daily. 11/15/14  Yes Historical Provider, MD  metoprolol succinate (TOPROL-XL) 100 MG 24 hr tablet Take 100 mg by mouth daily. Take with or immediately following a  meal. Patient takes 1 1/2 tablets per day   Yes Historical Provider, MD  MYRBETRIQ 25 MG TB24 Take 1 tablet by mouth Daily. 11/23/11  Yes Historical Provider, MD  pentosan polysulfate (ELMIRON) 100 MG capsule Take 100 mg by mouth 3 (three) times daily before meals.    Yes Historical Provider, MD  vitamin B-12 (CYANOCOBALAMIN) 1000 MCG tablet Take 1,000 mcg by mouth daily.   Yes Historical Provider, MD  Blood Glucose Monitoring Suppl (ONE TOUCH ULTRA 2) W/DEVICE KIT Use to check blood sugars 1 time per day 03/08/13   Elayne Snare, MD   BP 119/42 mmHg  Pulse 58  Temp(Src) 98.3 F (36.8 C) (Oral)  Resp 16  Ht 5' 5" (1.651 m)  Wt 112 lb 7 oz (51 kg)  BMI 18.71 kg/m2  SpO2 99% Physical Exam  Constitutional: She is oriented to person, place, and time. She appears well-developed and well-nourished.  HENT:  Head: Normocephalic and atraumatic.  Right Ear: External ear normal.  Left Ear: External ear normal.  Eyes: Conjunctivae and EOM are normal. Pupils are equal, round, and reactive to light.  Neck: Normal range of motion. Neck supple.  Cardiovascular: Normal rate, regular rhythm, normal heart sounds and intact distal pulses.   Pulmonary/Chest: Effort normal and breath sounds normal.  Abdominal: Soft. Bowel sounds are normal. There is no tenderness.  Musculoskeletal: Normal range of motion.  Neurological: She is alert and oriented to person, place, and time.  Skin: Skin is warm and dry.  Vitals reviewed.   ED Course  Procedures (including critical care time) Labs Review Labs Reviewed  BASIC METABOLIC PANEL - Abnormal; Notable for the following:    Sodium 127 (*)    Chloride 94 (*)    Glucose, Bld 292 (*)    BUN 23 (*)    GFR calc non Af Amer 56 (*)    All other components within normal limits  CBC - Abnormal; Notable for the following:    WBC 11.8 (*)    RBC 5.42 (*)    MCH 25.6 (*)    All other components within normal limits  HEPARIN LEVEL (UNFRACTIONATED) - Abnormal; Notable  for the following:    Heparin Unfractionated 1.06 (*)    All other components within normal limits  GLUCOSE, CAPILLARY - Abnormal; Notable for the following:    Glucose-Capillary 235 (*)    All other components within normal limits  COMPREHENSIVE METABOLIC PANEL - Abnormal; Notable for the following:    Sodium 124 (*)    Chloride 87 (*)    Glucose, Bld 229 (*)    BUN 21 (*)  Creatinine, Ser 1.02 (*)    Total Protein 5.5 (*)    Albumin 3.3 (*)    AST 185 (*)    GFR calc non Af Amer 54 (*)    All other components within normal limits  TROPONIN I - Abnormal; Notable for the following:    Troponin I 64.00 (*)    All other components within normal limits  TROPONIN I - Abnormal; Notable for the following:    Troponin I 30.34 (*)    All other components within normal limits  TROPONIN I - Abnormal; Notable for the following:    Troponin I 24.69 (*)    All other components within normal limits  CBC WITH DIFFERENTIAL/PLATELET - Abnormal; Notable for the following:    WBC 19.3 (*)    RBC 5.12 (*)    MCH 25.8 (*)    Neutro Abs 16.5 (*)    Monocytes Absolute 1.3 (*)    All other components within normal limits  BASIC METABOLIC PANEL - Abnormal; Notable for the following:    Sodium 132 (*)    Chloride 98 (*)    Glucose, Bld 161 (*)    Calcium 8.2 (*)    All other components within normal limits  HEMOGLOBIN A1C - Abnormal; Notable for the following:    Hgb A1c MFr Bld 6.9 (*)    All other components within normal limits  GLUCOSE, CAPILLARY - Abnormal; Notable for the following:    Glucose-Capillary 198 (*)    All other components within normal limits  GLUCOSE, CAPILLARY - Abnormal; Notable for the following:    Glucose-Capillary 159 (*)    All other components within normal limits  GLUCOSE, CAPILLARY - Abnormal; Notable for the following:    Glucose-Capillary 214 (*)    All other components within normal limits  I-STAT TROPOININ, ED - Abnormal; Notable for the following:     Troponin i, poc 16.12 (*)    All other components within normal limits  D-DIMER, QUANTITATIVE (NOT AT [facility])  LIPID PANEL    Imaging Review Dg Chest 2 View  01/02/2015   CLINICAL DATA:  Chest pain and hypercholesterolemia.  Hypertension.  EXAM: CHEST  2 VIEW  COMPARISON:  September 07, 2013  FINDINGS: There is no edema or consolidation. Heart size and pulmonary vascularity are normal. No adenopathy. There is postoperative change in the lower cervical region. No pneumothorax.  IMPRESSION: No edema or consolidation.   Electronically Signed   By: Lowella Grip III M.D.   On: 01/02/2015 14:57   I have personally reviewed and evaluated these images and lab results as part of my medical decision-making.   EKG Interpretation   Date/Time:  Monday January 02 2015 15:05:28 EDT Ventricular Rate:  61 PR Interval:  154 QRS Duration: 74 QT Interval:  426 QTC Calculation: 429 R Axis:   50 Text Interpretation:  Sinus rhythm Probable left atrial enlargement  Inferior infarct, acute (LCx) ED PHYSICIAN INTERPRETATION AVAILABLE IN  [facility] Confirmed by TEST, Record ([id number]) on 01/03/2015 7:29:22 AM     CRITICAL CARE Performed by: Debby Freiberg   Total critical care time: 74 min  Critical care time was exclusive of separately billable procedures and treating other patients.  Critical care was necessary to treat or prevent imminent or life-threatening deterioration.  Critical care was time spent personally by me on the following activities: development of treatment plan with patient and/or surrogate as well as nursing, discussions with consultants, evaluation of patient's response to treatment, examination  of patient, obtaining history from patient or surrogate, ordering and performing treatments and interventions, ordering and review of laboratory studies, ordering and review of radiographic studies, pulse oximetry and re-evaluation of patient's condition.  MDM   Final diagnoses:  NSTEMI  (non-ST elevated myocardial infarction) [facility])    69 y.o. female with pertinent PMH of HTN, DM, anxiety presents with fatigue, diaphoresis this am at rest.  No fevers, no recent illness, and no pain reported.  Physical exam benign.  Symptoms and BP improved prior to my exam.  EKG demonstrated nonspecific STE.  Dr. Regenia Skeeter reviewed this with cardiology prior to my care.  Consulted Dr. Terrence Dupont after trop of 16.  Admitted in stable condition  I have reviewed all laboratory and imaging studies if ordered as above  1. NSTEMI (non-ST elevated myocardial infarction) (HCC)         Debby Freiberg, MD 01/03/15 [phone]

## 2015-01-02 NOTE — ED Notes (Signed)
Belongings sent home with family.

## 2015-01-02 NOTE — H&P (Signed)
Abigail Hoover is an 69 y.o. female.   Chief Complaint: Chest burning associated with diaphoresis HPI: Patient is 69 year old female with past medical history significant for hypertension, diabetes mellitus, hypothyroidism, GERD, hyper anemia, hypothyroidism, iron deficiency anemia, anxiety disorder, degenerative joint disease, came to the ER by EMS complaining of retrosternal chest burning associated with diaphoresis EKG done in the ER showed sinus bradycardia with minimal ST elevation concave upward in inferolateral leads and minimal ST depression in lead aVL and was noted to have troponin of 16.12. Patient denies such episodes of chest burning in the past denies any shortness of breath. Denies any palpitation lightheadedness or syncope. Due to typical chest pain and multiple risk factors elevated cardiac enzymes and minor EKG changes discussed with patient and her husband regarding emergency left cath possible PTCA stenting and consents for PCI  Past Medical History  Diagnosis Date  . Diabetes mellitus   . Hypertension   . Thyroid disease   . Arthritis   . Anemia   . GERD (gastroesophageal reflux disease)   . Hypercholesterolemia   . Bladder irritation   . Iron deficiency anemia, unspecified   . Hypothyroidism   . Anxiety   . Psoriasis   . Deficiency anemia 05/27/2014    Past Surgical History  Procedure Laterality Date  . Abdominal hysterectomy      TAH  . Cervical disc surgery    . Cataract extraction    . Breast biopsy      Left breast biopsy benign lesion  . Colonoscopy  2013    neg. next 2023.   Marland Kitchen Eye surgery    . Breast biopsy  03/11/2012    Procedure: BREAST BIOPSY;  Surgeon: Odis Hollingshead, MD;  Location: [place];  Service: General;  Laterality: Left;  remove left breast mass    Family History  Problem Relation Age of Onset  . Breast cancer Sister   . Cancer Sister     breast cancer  . Heart disease Mother     heart attack  . Stroke Father     brain hemorrhage  .  Diabetes Brother   . Cancer Brother    Social History:  reports that she has never smoked. She has never used smokeless tobacco. She reports that she does not drink alcohol or use illicit drugs.  Allergies:  Allergies  Allergen Reactions  . Ezetimibe Itching  . Atorvastatin     REACTION: rash     (Not in a hospital admission)  Results for orders placed or performed during the hospital encounter of 01/02/15 (from the past 48 hour(s))  Basic metabolic panel     Status: Abnormal   Collection Time: 01/02/15  2:32 PM  Result Value Ref Range   Sodium 127 (L) 135 - 145 mmol/L   Potassium 4.9 3.5 - 5.1 mmol/L   Chloride 94 (L) 101 - 111 mmol/L   CO2 25 22 - 32 mmol/L   Glucose, Bld 292 (H) 65 - 99 mg/dL   BUN 23 (H) 6 - 20 mg/dL   Creatinine, Ser 0.99 0.44 - 1.00 mg/dL   Calcium 9.5 8.9 - 10.3 mg/dL   GFR calc non Af Amer 56 (L) >60 mL/min   GFR calc Af Amer >60 >60 mL/min    Comment: (NOTE) The eGFR has been calculated using the CKD EPI equation. This calculation has not been validated in all clinical situations. eGFR's persistently <60 mL/min signify possible Chronic Kidney Disease.    Anion gap 8 5 -  15  CBC     Status: Abnormal   Collection Time: 01/02/15  2:32 PM  Result Value Ref Range   WBC 11.8 (H) 4.0 - 10.5 K/uL   RBC 5.42 (H) 3.87 - 5.11 MIL/uL   Hemoglobin 13.9 12.0 - 15.0 g/dL   HCT 43.4 36.0 - 46.0 %   MCV 80.1 78.0 - 100.0 fL   MCH 25.6 (L) 26.0 - 34.0 pg   MCHC 32.0 30.0 - 36.0 g/dL   RDW 15.0 11.5 - 15.5 %   Platelets 243 150 - 400 K/uL  I-stat troponin, ED     Status: Abnormal   Collection Time: 01/02/15  2:46 PM  Result Value Ref Range   Troponin i, poc 16.12 (HH) 0.00 - 0.08 ng/mL   Comment NOTIFIED PHYSICIAN    Comment 3            Comment: Due to the release kinetics of cTnI, a negative result within the first hours of the onset of symptoms does not rule out myocardial infarction with certainty. If myocardial infarction is still  suspected, repeat the test at appropriate intervals.    Dg Chest 2 View  01/02/2015   CLINICAL DATA:  Chest pain and hypercholesterolemia.  Hypertension.  EXAM: CHEST  2 VIEW  COMPARISON:  September 07, 2013  FINDINGS: There is no edema or consolidation. Heart size and pulmonary vascularity are normal. No adenopathy. There is postoperative change in the lower cervical region. No pneumothorax.  IMPRESSION: No edema or consolidation.   Electronically Signed   By: Lowella Grip III M.D.   On: 01/02/2015 14:57    Review of Systems  Constitutional: Negative for fever and chills.  Eyes: Negative for blurred vision and double vision.  Respiratory: Negative for cough and hemoptysis.   Cardiovascular: Positive for chest pain. Negative for palpitations, orthopnea and claudication.  Gastrointestinal: Negative for nausea and vomiting.  Genitourinary: Negative for dysuria.  Neurological: Negative for headaches.    Blood pressure 121/86, pulse 70, resp. rate 20, weight 50.3 kg (110 lb 14.3 oz), SpO2 100 %. Physical Exam  Constitutional: She is oriented to person, place, and time.  HENT:  Head: Normocephalic and atraumatic.  Eyes: Conjunctivae are normal. Pupils are equal, round, and reactive to light.  Neck: Normal range of motion. Neck supple. No JVD present. No tracheal deviation present. No thyromegaly present.  Cardiovascular: Normal rate and regular rhythm.   Murmur (soft systolic murmur noted no S3 gallop) heard. Respiratory: Breath sounds normal. No respiratory distress. She has no wheezes. She has no rales.  GI: Soft. Bowel sounds are normal. She exhibits no distension. There is no tenderness.  Musculoskeletal: She exhibits no edema or tenderness.  Neurological: She is alert and oriented to person, place, and time.     Assessment/Plan Probable acute inferior wall myocardial infarction Hypertension Diabetes mellitus Hyperlipidemia GERD Hypothyroidism Degenerative joint  disease Chronic anemia Plan Discussed with patient and her husband at length regarding emergency left cath possible PTCA stenting its risk and benefits i.e. death MI stroke need for emergency CABG local vascular complications etc. and consents for PCI.  Charolette Forward 01/02/2015, 3:56 PM

## 2015-01-02 NOTE — ED Notes (Signed)
Pt placed in gown and in bed. Pt monitored by pulse ox, bp cuff, and 12-lead. 

## 2015-01-02 NOTE — ED Provider Notes (Signed)
1:43 PM Discussed EKG with Dr. Burt Knack, not c/w a STEMI. Recommends treating her BP (>200) and repeat EKG. No focal CP but does have an "indigestion" feeling. Care to treating ED provider.  Sherwood Gambler, MD 01/02/15 1344

## 2015-01-02 NOTE — Progress Notes (Signed)
TR BAND REMOVAL  LOCATION:    CRITICAL VALUE ALERT  Critical value received:  Troponin 64.0  Date of notification:  01/02/2015   Time of notification:  19:50  Critical value read back:Yes.    Nurse who received alert:  Maxwell Marion RN  MD notified (1st page):  Dr. Terrence Dupont  Time of first page:  19:50  MD notified (2nd page):  Time of second page:  Responding MD:  Dr. Terrence Dupont  Time MD responded:  20:00  COMMENTS:   Dr. Terrence Dupont informed of Troponin result of 64.0, new orders received for EKG,

## 2015-01-02 NOTE — Progress Notes (Signed)
Site area: right groin a 6 french arterial sheath was removed  Site Prior to Removal:  Level 0  Pressure Applied For 15 MINUTES    Minutes Beginning at 1720p  Manual:   Yes.    Patient Status During Pull:  stable  Post Pull Groin Site:  Level 0  Post Pull Instructions Given:  Yes.    Post Pull Pulses Present:  Yes.    Dressing Applied:  Yes.    Comments:  VS remain stable during sheath pull.  Pt stated no pain or discomfort at this time.

## 2015-01-02 NOTE — ED Notes (Signed)
Daughter states pt became diaphoretic, fatigued and noticed BP was elevated this morning at home. Denies chest pain, respirations unlabored. Pt is alert and oriented x4.

## 2015-01-02 NOTE — Progress Notes (Signed)
ANTICOAGULATION CONSULT NOTE - Initial Consult  Pharmacy Consult for Heparin Indication: chest pain/ACS  Allergies  Allergen Reactions  . Ezetimibe Itching  . Atorvastatin     REACTION: rash    Patient Measurements: Weight: 110 lb 14.3 oz (50.3 kg)  Vital Signs: BP: 137/69 mmHg (10/10 1507) Pulse Rate: 62 (10/10 1507)  Labs:  Recent Labs  01/02/15 1432  HGB 13.9  HCT 43.4  PLT 243  CREATININE 0.99    Estimated Creatinine Clearance: 41.4 mL/min (by C-G formula based on Cr of 0.99).   Medical History: Past Medical History  Diagnosis Date  . Diabetes mellitus   . Hypertension   . Thyroid disease   . Arthritis   . Anemia   . GERD (gastroesophageal reflux disease)   . Hypercholesterolemia   . Bladder irritation   . Iron deficiency anemia, unspecified   . Hypothyroidism   . Anxiety   . Psoriasis   . Deficiency anemia 05/27/2014    Medications:  No anticoagulants pta  Assessment: 69yof presented to the ED with fatigue, diaphoresis, and SBP > 200. First troponin elevated at 16. She will begin IV heparin. Baseline labs wnl.  Goal of Therapy:  Heparin level 0.3-0.7 units/ml Monitor platelets by anticoagulation protocol: Yes   Plan:  1) Heparin bolus 3000 units x 1 2) Heparin drip at 700 units/hr 3) Check 8 hour heparin level 4) Daily heparin level and CBC  Deboraha Sprang 01/02/2015,3:27 PM

## 2015-01-02 NOTE — ED Notes (Signed)
Pt transported to xray 

## 2015-01-03 ENCOUNTER — Encounter (HOSPITAL_COMMUNITY): Payer: Self-pay | Admitting: Cardiology

## 2015-01-03 ENCOUNTER — Inpatient Hospital Stay (HOSPITAL_COMMUNITY): Payer: Medicare Other

## 2015-01-03 ENCOUNTER — Other Ambulatory Visit (HOSPITAL_COMMUNITY): Payer: Medicare Other

## 2015-01-03 DIAGNOSIS — E039 Hypothyroidism, unspecified: Secondary | ICD-10-CM | POA: Diagnosis present

## 2015-01-03 DIAGNOSIS — M1990 Unspecified osteoarthritis, unspecified site: Secondary | ICD-10-CM | POA: Diagnosis present

## 2015-01-03 DIAGNOSIS — I249 Acute ischemic heart disease, unspecified: Secondary | ICD-10-CM | POA: Diagnosis not present

## 2015-01-03 DIAGNOSIS — Z7982 Long term (current) use of aspirin: Secondary | ICD-10-CM | POA: Diagnosis not present

## 2015-01-03 DIAGNOSIS — K219 Gastro-esophageal reflux disease without esophagitis: Secondary | ICD-10-CM | POA: Diagnosis present

## 2015-01-03 DIAGNOSIS — E119 Type 2 diabetes mellitus without complications: Secondary | ICD-10-CM | POA: Diagnosis not present

## 2015-01-03 DIAGNOSIS — I2510 Atherosclerotic heart disease of native coronary artery without angina pectoris: Secondary | ICD-10-CM | POA: Diagnosis not present

## 2015-01-03 DIAGNOSIS — I25111 Atherosclerotic heart disease of native coronary artery with angina pectoris with documented spasm: Secondary | ICD-10-CM | POA: Diagnosis not present

## 2015-01-03 DIAGNOSIS — I10 Essential (primary) hypertension: Secondary | ICD-10-CM | POA: Diagnosis present

## 2015-01-03 DIAGNOSIS — E785 Hyperlipidemia, unspecified: Secondary | ICD-10-CM | POA: Diagnosis present

## 2015-01-03 DIAGNOSIS — D649 Anemia, unspecified: Secondary | ICD-10-CM | POA: Diagnosis present

## 2015-01-03 DIAGNOSIS — R5383 Other fatigue: Secondary | ICD-10-CM | POA: Diagnosis not present

## 2015-01-03 DIAGNOSIS — L409 Psoriasis, unspecified: Secondary | ICD-10-CM | POA: Diagnosis present

## 2015-01-03 LAB — POCT ACTIVATED CLOTTING TIME: Activated Clotting Time: 159 seconds

## 2015-01-03 LAB — BASIC METABOLIC PANEL
Anion gap: 7 (ref 5–15)
BUN: 14 mg/dL (ref 6–20)
CO2: 27 mmol/L (ref 22–32)
Calcium: 8.2 mg/dL — ABNORMAL LOW (ref 8.9–10.3)
Chloride: 98 mmol/L — ABNORMAL LOW (ref 101–111)
Creatinine, Ser: 0.93 mg/dL (ref 0.44–1.00)
GFR calc Af Amer: >60 mL/min (ref >60)
GFR calc non Af Amer: >60 mL/min (ref >60)
Glucose, Bld: 161 mg/dL — ABNORMAL HIGH (ref 65–99)
Potassium: 4.6 mmol/L (ref 3.5–5.1)
Sodium: 132 mmol/L — ABNORMAL LOW (ref 135–145)

## 2015-01-03 LAB — GLUCOSE, CAPILLARY
Glucose-Capillary: 159 mg/dL — ABNORMAL HIGH (ref 65–99)
Glucose-Capillary: 214 mg/dL — ABNORMAL HIGH (ref 65–99)
Glucose-Capillary: 292 mg/dL — ABNORMAL HIGH (ref 65–99)
Glucose-Capillary: 236 mg/dL — ABNORMAL HIGH (ref 65–99)

## 2015-01-03 LAB — LIPID PANEL
Cholesterol: 127 mg/dL (ref 0–200)
HDL: 63 mg/dL (ref >40)
LDL Cholesterol: 52 mg/dL (ref 0–99)
Total CHOL/HDL Ratio: 2 RATIO
Triglycerides: 60 mg/dL (ref <150)
VLDL: 12 mg/dL (ref 0–40)

## 2015-01-03 LAB — TROPONIN I
Troponin I: 30.34 ng/mL (ref <0.031)
Troponin I: 24.69 ng/mL (ref <0.031)

## 2015-01-03 LAB — HEMOGLOBIN A1C
Hgb A1c MFr Bld: 6.9 % — ABNORMAL HIGH (ref 4.8–5.6)
Mean Plasma Glucose: 151 mg/dL

## 2015-01-03 MED ORDER — ISOSORBIDE MONONITRATE ER 60 MG PO TB24
60.0000 mg | ORAL_TABLET | Freq: Every day | ORAL | Status: DC
  Administered 2015-01-04: 60 mg via ORAL
  Filled 2015-01-03: qty 1

## 2015-01-03 MED ORDER — INSULIN ASPART 100 UNIT/ML ~~LOC~~ SOLN
0.0000 [IU] | Freq: Every day | SUBCUTANEOUS | Status: DC
  Administered 2015-01-03: 22:00:00 2 [IU] via SUBCUTANEOUS

## 2015-01-03 MED ORDER — LIVING WELL WITH DIABETES BOOK
Freq: Once | Status: AC
Start: 2015-01-03 — End: 2015-01-03
  Administered 2015-01-03: 22:00:00
  Filled 2015-01-03: qty 1

## 2015-01-03 MED ORDER — INSULIN ASPART 100 UNIT/ML ~~LOC~~ SOLN: 0.0000 [IU] | Freq: Three times a day (TID) | SUBCUTANEOUS | Status: DC

## 2015-01-03 MED ORDER — INSULIN ASPART 100 UNIT/ML ~~LOC~~ SOLN
0.0000 [IU] | Freq: Three times a day (TID) | SUBCUTANEOUS | Status: DC
  Administered 2015-01-04: 3 [IU] via SUBCUTANEOUS

## 2015-01-03 NOTE — Progress Notes (Signed)
CARDIAC REHAB PHASE I   PRE:  Rate/Rhythm: 61 SR    BP: sitting 113/35    SaO2:   MODE:  Ambulation: 300 ft   POST:  Rate/Rhythm: 63 SR    BP: sitting 119/42     SaO2:   Order written per MI protocol. Pt without c/o walking, denied sx. HR did not increase, BP low. Explained enzymes and vasospasm to pt and family. Gave MI book for family (pt declined, does not read english). Will f/u. 1000-1034   Abigail Hoover CES, ACSM 01/03/2015 10:31 AM     Order written per MI protocol.

## 2015-01-03 NOTE — Progress Notes (Signed)
Subjective:  Patient denies any chest pain pressure tightness heaviness or shortness of breath. Denies any flu in the recent past. Denies fever chills. Troponin I peaked up to 64 not trending down. EKG post cardiac catheterization yesterday showed normal sinus rhythm with no acute ischemic changes this a.m. EKG showed normal sinus rhythm with poor R-wave progression in anterolateral leads probably secondary to lead placement.  Objective:  Vital Signs in the last 24 hours: Temp:  [97.7 F (36.5 C)-98.1 F (36.7 C)] 98 F (36.7 C) (10/11 0812) Pulse Rate:  [49-129] 61 (10/11 0812) Resp:  [11-20] 16 (10/11 0812) BP: (108-197)/(21-93) 155/38 mmHg (10/11 0812) SpO2:  [98 %-100 %] 99 % (10/11 0812) Weight:  [50.3 kg (110 lb 14.3 oz)-51 kg (112 lb 7 oz)] 51 kg (112 lb 7 oz) (10/11 0259)  Intake/Output from previous day: 10/10 0701 - 10/11 0700 In: 1112.5 [I.V.:1112.5] Out: 1300 [Urine:1300] Intake/Output from this shift: Total I/O In: 120 [P.O.:120] Out: -   Physical Exam: Neck: no adenopathy, no carotid bruit, no JVD and supple, symmetrical, trachea midline Lungs: clear to auscultation bilaterally Heart: regular rate and rhythm, S1, S2 normal and Soft systolic and diastolic murmur noted Abdomen: soft, non-tender; bowel sounds normal; no masses,  no organomegaly Extremities: extremities normal, atraumatic, no cyanosis or edema and Right groin stable  Lab Results:  Recent Labs  01/02/15 1432 01/02/15 1842  WBC 11.8* 19.3*  HGB 13.9 13.2  PLT 243 287    Recent Labs  01/02/15 1842 01/03/15 0606  NA 124* 132*  K 4.9 4.6  CL 87* 98*  CO2 27 27  GLUCOSE 229* 161*  BUN 21* 14  CREATININE 1.02* 0.93    Recent Labs  01/03/15 0046 01/03/15 0606  TROPONINI 30.34* 24.69*   Hepatic Function Panel  Recent Labs  01/02/15 1842  PROT 5.5*  ALBUMIN 3.3*  AST 185*  ALT 32  ALKPHOS 60  BILITOT 0.6    Recent Labs  01/03/15 0046  CHOL 127   No results for input(s):  PROTIME in the last 72 hours.  Imaging: Imaging results have been reviewed and Dg Chest 2 View  01/02/2015   CLINICAL DATA:  Chest pain and hypercholesterolemia.  Hypertension.  EXAM: CHEST  2 VIEW  COMPARISON:  September 07, 2013  FINDINGS: There is no edema or consolidation. Heart size and pulmonary vascularity are normal. No adenopathy. There is postoperative change in the lower cervical region. No pneumothorax.  IMPRESSION: No edema or consolidation.   Electronically Signed   By: Lowella Grip III M.D.   On: 01/02/2015 14:57    Cardiac Studies:  Assessment/Plan:  Status post Acute coronary syndrome with markedly elevated troponin I probably secondary to vasospasm with prolonged ischemia and myonecrosis/LVH with subendocardial MI. Doubt myocarditis/pericarditis status post left cath Mild coronary artery disease Hypertension Diabetes mellitus Hyperlipidemia GERD Hypothyroidism Degenerative joint disease Chronic anemia Marked leukocytosis questionable etiology   status post hyponatremia Plan Increase isosorbide mononitrate as per orders Check 2-D echo Check labs in a.m.   Charolette Forward 01/03/2015, 9:52 AM

## 2015-01-03 NOTE — Progress Notes (Signed)
  Echocardiogram 2D Echocardiogram has been performed.  Diamond Nickel 01/03/2015, 3:55 PM

## 2015-01-04 LAB — BASIC METABOLIC PANEL
Anion gap: 4 — ABNORMAL LOW (ref 5–15)
BUN: 11 mg/dL (ref 6–20)
CO2: 27 mmol/L (ref 22–32)
Calcium: 7.7 mg/dL — ABNORMAL LOW (ref 8.9–10.3)
Chloride: 101 mmol/L (ref 101–111)
Creatinine, Ser: 0.99 mg/dL (ref 0.44–1.00)
GFR calc Af Amer: >60 mL/min (ref >60)
GFR calc non Af Amer: 56 mL/min — ABNORMAL LOW (ref >60)
Glucose, Bld: 156 mg/dL — ABNORMAL HIGH (ref 65–99)
Potassium: 4.6 mmol/L (ref 3.5–5.1)
Sodium: 132 mmol/L — ABNORMAL LOW (ref 135–145)

## 2015-01-04 LAB — CBC
HCT: 35.3 % — ABNORMAL LOW (ref 36.0–46.0)
Hemoglobin: 11.2 g/dL — ABNORMAL LOW (ref 12.0–15.0)
MCH: 25.7 pg — ABNORMAL LOW (ref 26.0–34.0)
MCHC: 31.7 g/dL (ref 30.0–36.0)
MCV: 81 fL (ref 78.0–100.0)
Platelets: 228 10*3/uL (ref 150–400)
RBC: 4.36 MIL/uL (ref 3.87–5.11)
RDW: 15.3 % (ref 11.5–15.5)
WBC: 8.2 10*3/uL (ref 4.0–10.5)

## 2015-01-04 LAB — TROPONIN I: Troponin I: 13.32 ng/mL (ref <0.031)

## 2015-01-04 LAB — GLUCOSE, CAPILLARY: Glucose-Capillary: 155 mg/dL — ABNORMAL HIGH (ref 65–99)

## 2015-01-04 MED ORDER — NITROGLYCERIN 0.4 MG SL SUBL: 0.4000 mg | SUBLINGUAL_TABLET | SUBLINGUAL | Status: DC | PRN

## 2015-01-04 MED ORDER — ISOSORBIDE MONONITRATE ER 60 MG PO TB24: 60.0000 mg | ORAL_TABLET | Freq: Every day | ORAL | Status: DC

## 2015-01-04 MED ORDER — AMLODIPINE BESYLATE 2.5 MG PO TABS: 2.5000 mg | ORAL_TABLET | Freq: Every day | ORAL | Status: DC

## 2015-01-04 MED ORDER — METOPROLOL SUCCINATE ER 50 MG PO TB24: 50.0000 mg | ORAL_TABLET | Freq: Every day | ORAL | Status: DC

## 2015-01-04 NOTE — Progress Notes (Signed)
CARDIAC REHAB PHASE I   PRE:  Rate/Rhythm: 107/42    BP: sitting     SaO2:   MODE:  Ambulation: 1000 ft   POST:  Rate/Rhythm: 65 SR    BP: sitting 129/67   SaO2:    Tolerated well. Denies CP or SOB. Long distance. Gave pt ex gl and discussed CRPII. Will send referral to [facility] CRPII.  [id number]  Darrick Meigs CES, ACSM 01/04/2015 8:37 AM     57 SB

## 2015-01-04 NOTE — Discharge Instructions (Signed)
Acute Coronary Syndrome °Acute coronary syndrome (ACS) is a serious problem in which there is suddenly not enough blood and oxygen supplied to the heart. ACS may mean that one or more of the blood vessels in your heart (coronary arteries) may be blocked. ACS can result in chest pain or a heart attack (myocardial infarction or MI). °CAUSES °This condition is caused by atherosclerosis, which is the buildup of fat and cholesterol (plaque) on the inside of the arteries. Over time, the plaque may narrow or block the artery, and this will lessen blood flow to the heart. Plaque can also become weak and break off within a coronary artery to form a clot and cause a sudden blockage. °RISK FACTORS °The risks factors of this condition include: °· High cholesterol levels. °· High blood pressure (hypertension). °· Smoking. °· Diabetes. °· Age. °· Family history of chest pain, heart disease, or stroke. °· Lack of exercise. °SYMPTOMS °The most common signs of this condition include: °· Chest pain, which can be: °¨ A crushing or squeezing in the chest. °¨ A tightness, pressure, fullness, or heaviness in the chest. °¨ Present for more than a few minutes, or it can stop and recur. °· Pain in the arms, neck, jaw, or back. °· Unexplained heartburn or indigestion. °· Shortness of breath. °· Nausea. °· Sudden cold sweats. °· Feeling light-headed or dizzy. °Sometimes, this condition has no symptoms. °DIAGNOSIS °ACS may be diagnosed through the following tests: °· Electrocardiogram (ECG). °· Blood tests. °· Coronary angiogram. This is a procedure to look at the coronary arteries to see if there is any blockage. °TREATMENT °Treatment for ACS may include: °· Healthy behavioral changes to reduce or control risk factors. °· Medicine. °· Coronary stenting. A stent helps to keep an artery open. °· Coronary angioplasty. This procedure widens a narrowed or blocked artery. °· Coronary artery bypass surgery. This will allow your blood to pass the  blockage (bypass) to reach your heart. °HOME CARE INSTRUCTIONS °Eating and Drinking °· Follow a heart-healthy diet. A dietitian can you help to educate you about healthy food options and changes. °· Use healthy cooking methods such as roasting, grilling, broiling, baking, poaching, steaming, or stir-frying. Talk to a dietitian to learn more about healthy cooking methods. °Medicines °· Take medicines only as directed by your health care provider. °· Do not take the following medicines unless your health care provider approves: °¨ Nonsteroidal anti-inflammatory drugs (NSAIDs), such as ibuprofen, naproxen, or celecoxib. °¨ Vitamin supplements that contain vitamin A, vitamin E, or both. °¨ Hormone replacement therapy that contains estrogen with or without progestin. °· Stop illegal drug use. °Activities °· Follow an exercise program that is approved by your health care provider. °· Plan rest periods when you are fatigued. °Lifestyle °· Do not use any tobacco products, including cigarettes, chewing tobacco, or electronic cigarettes. If you need help quitting, ask your health care provider. °· If you drink alcohol, and your health care provider approves, limit your alcohol intake to no more than 1 drink per day. One drink equals 12 ounces of beer, 5 ounces of wine, or 1½ ounces of hard liquor. °· Learn to manage stress. °· Maintain a healthy weight. Lose weight as approved by your health care provider. °General Instructions °· Manage other health conditions, such as hypertension and diabetes, as directed by your health care provider. °· Keep all follow-up visits as directed by your health care provider. This is important. °· Your health care provider may ask you to monitor your blood   pressure. A blood pressure reading consists of a higher number over a lower number, such as 110 over 72, written as 110/72. Ideally, your blood pressure should be: °¨ Below 140/90 if you have no other medical conditions. °¨ Below 130/80 if  you have diabetes or kidney disease. °SEEK IMMEDIATE MEDICAL CARE IF: °· You have pain in your chest, neck, arm, jaw, stomach, or back that lasts more than a few minutes, is recurring, or is not relieved by taking medicine under your tongue (sublingual nitroglycerin). °· You have profuse sweating without cause. °· You have unexplained: °¨ Heartburn or indigestion. °¨ Shortness of breath or difficulty breathing. °¨ Nausea or vomiting. °¨ Fatigue. °¨ Feelings of nervousness or anxiety. °¨ Weakness. °¨ Diarrhea. °· You have sudden light-headedness or dizziness. °· You faint. °These symptoms may represent a serious problem that is an emergency. Do not wait to see if the symptoms will go away. Get medical help right away. Call your local emergency services (911 in the [place] Do not drive yourself to the clinic or hospital. °  °This information is not intended to replace advice given to you by your health care provider. Make sure you discuss any questions you have with your health care provider. °  °Document Released: 03/11/2005 Document Revised: 04/01/2014 Document Reviewed: 07/13/2013 °Elsevier Interactive Patient Education ©2016 [employer] Inc. ° °Coronary Angiogram °A coronary angiogram, also called coronary angiography, is an X-ray procedure used to look at the arteries in the heart. In this procedure, a dye (contrast dye) is injected through a long, hollow tube (catheter). The catheter is about the size of a piece of cooked spaghetti and is inserted through your groin, wrist, or arm. The dye is injected into each artery, and X-rays are then taken to show if there is a blockage in the arteries of your heart. °LET YOUR HEALTH CARE PROVIDER KNOW ABOUT: °· Any allergies you have, including allergies to shellfish or contrast dye.   °· All medicines you are taking, including vitamins, herbs, eye drops, creams, and over-the-counter medicines.   °· Previous problems you or members of your family have had with the use of  anesthetics.   °· Any blood disorders you have.   °· Previous surgeries you have had. °· History of kidney problems or failure.   °· Other medical conditions you have. °RISKS AND COMPLICATIONS  °Generally, a coronary angiogram is a safe procedure. However, problems can occur and include: °· Allergic reaction to the dye. °· Bleeding from the access site or other locations. °· Kidney injury, especially in people with impaired kidney function.  °· Stroke (rare). °· Heart attack (rare). °BEFORE THE PROCEDURE  °· Do not eat or drink anything after midnight the night before the procedure or as directed by your health care provider.   °· Ask your health care provider about changing or stopping your regular medicines. This is especially important if you are taking diabetes medicines or blood thinners. °PROCEDURE °· You may be given a medicine to help you relax (sedative) before the procedure. This medicine is given through an intravenous (IV) access tube that is inserted into one of your veins.   °· The area where the catheter will be inserted will be washed and shaved. This is usually done in the groin but may be done in the fold of your arm (near your elbow) or in the wrist.    °· A medicine will be given to numb the area where the catheter will be inserted (local anesthetic).   °· The health care provider will insert the catheter   into an artery. The catheter will be guided by using a special type of X-ray (fluoroscopy) of the blood vessel being examined.   °· A special dye will then be injected into the catheter, and X-rays will be taken. The dye will help to show where any narrowing or blockages are located in the heart arteries.   °AFTER THE PROCEDURE  °· If the procedure is done through the leg, you will be kept in bed lying flat for several hours. You will be instructed to not bend or cross your legs. °· The insertion site will be checked frequently.   °· The pulse in your feet or wrist will be checked frequently.    °· Additional blood tests, X-rays, and an electrocardiogram may be done.   °  °This information is not intended to replace advice given to you by your health care provider. Make sure you discuss any questions you have with your health care provider. °  °Document Released: 09/15/2002 Document Revised: 04/01/2014 Document Reviewed: 08/03/2012 °Elsevier Interactive Patient Education ©2016 Else[employer] Inc. ° °

## 2015-01-04 NOTE — Discharge Summary (Signed)
Discharge summary dictated on 01/04/2015, dictation number is [phone]

## 2015-01-04 NOTE — Progress Notes (Signed)
Inpatient Diabetes Program Recommendations  AACE/ADA: New Consensus Statement on Inpatient Glycemic Control (2015)  Target Ranges:  Prepandial:   less than 140 mg/dL      Peak postprandial:   less than 180 mg/dL (1-2 hours)      Critically ill patients:  140 - 180 mg/dL   Review of Glycemic Control  Fasting glucose readings are fairly well controlled. The cbg's prior to lunch and supper and at bedtime (following supper) are elevated. Pt takes Glyset (Miglitol) at home before each meal. (Meglitinides are rapid and immediate acting sulfonylureas that is taken immediately prior to a meal). Patient is not getting this here; would not recommend using here either as her po intake is variable at this point.  Inpatient Diabetes Program Recommendations:    Recommend increase in correction to moderate tidwc.  Thank you Rosita Kea, RN, MSN, CDE  Diabetes Inpatient Program Office: [phone] Pager: [phone] 8:00 am to 5:00 pm

## 2015-01-05 NOTE — Discharge Summary (Signed)
NAMEMarland Kitchen  SHEVONNE, CASS[place] NO.:  [id number]  MEDICAL RECORD NO.:  [id number]  LOCATION:  [id number]                        FACILITY:  [facility]  PHYSICIAN:  Kellen Hover N. Terrence Dupont, M.D. DATE OF BIRTH:  [birth year]  DATE OF ADMISSION:  01/02/2015 DATE OF DISCHARGE:  01/04/2015                              DISCHARGE SUMMARY   ADMITTING DIAGNOSES: 1. 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 2. Hypertension. 3. Diabetes mellitus. 4. Hyperlipidemia. 5. Gastroesophageal reflux disease. 6. Hypothyroidism. 7. Degenerative joint disease. 8. Chronic anemia.  DISCHARGE DIAGNOSES: 1. Status post acute coronary syndrome with markedly elevated troponin-     I probably secondary to vasospasm with prolonged ischemia and     myonecrosis/left ventricular hypertrophy with subendocardial     myocardial infarction, doubt myocarditis and pericarditis status     post left catheterization. 2. Mild coronary artery disease. 3. Hypertension. 4. Diabetes mellitus. 5. Hyperlipidemia. 6. Gastroesophageal reflux disease. 7. Hypothyroidism. 8. Degenerative joint disease. 9. Chronic anemia. 10.Status post hyponatremia. 11.Status post marked leukocytosis.  DISCHARGE MEDICATIONS: 1. Isosorbide mononitrate 60 mg daily. 2. Nitrostat 0.4 mg sublingual p.r.n. 3. Aspirin 81 mg 1 tablet daily. 4. Dexilant 60 mg 1 capsule daily. 5. Neurontin 300 mg 4 times daily as before. 6. Glyset 50 mg 1 tablet twice daily. 7. Boniva 150 mg 1 tablet every month as before. 8. Janumet 50/500 mg 1 tablet twice daily. 9. Levothyroxine 88 mcg daily. 10.Losartan 50 mg daily. 11.Myrbetriq 25 mg 1 tablet daily. 12.Elmiron 100 mg 1 capsule 3 times daily as before. 13.Vitamin B12 at 1000 mcg daily. 14.Vitamin D3 two capsules daily. 15.Welchol 3.75 g pack 1 packet daily. 16.Amlodipine 2.5 mg daily. 17.Metoprolol succinate 50 mg 1 tablet daily.  DIET:  Low salt, low cholesterol, 1800 calories ADA diet.  The  patient has been advised to monitor blood pressure and blood sugar daily and chart.  FOLLOWUP:  Follow up with me in 1 week.  Post-cardiac cath instructions have been given.  CONDITION AT DISCHARGE:  Stable.  BRIEF HISTORY AND HOSPITAL COURSE:  Abigail Hoover is a 69 year old female with past medical history significant for hypertension, diabetes mellitus, hypothyroidism, GERD, anemia, hypothyroidism, iron deficiency anemia, anxiety disorder, degenerative joint disease.  She came to the ER by EMS complaining of retrosternal burning associated with diaphoresis.  EKG done in the ED showed sinus bradycardia with minimal ST elevation concave upward in inferolateral leads with minimal ST depression in lead aVL and was noted to have troponin of 16.12.  The patient denies such episodes of chest burning in the past.  Denies any shortness of breath.  Denies palpitation, lightheadedness, or syncope. Typical chest pain, multiple risk factors, elevated cardiac enzymes, and minor EKG changes were discussed with the patient and husband regarding emergency left cath, possible PTCA, stenting, and consents for PCI.  PHYSICAL EXAMINATION:  GENERAL:  She was alert, awake, oriented x3, in no acute distress. VITAL SIGNS:  Blood pressure was 121/86, pulse 70.  She was afebrile. EYE:  Conjunctivae were pink. NECK:  Supple.  No JVD.  No bruit. LUNGS:  Clear to auscultation without rhonchi or rales. CARDIOVASCULAR:  S1, S2 normal.  There was soft systolic murmur.  No S3,  gallop. ABDOMEN:  Soft.  Bowel sounds were present.  Nontender. EXTREMITIES:  There is no clubbing, cyanosis, or edema.  LABORATORY DATA AND STUDIES:  Sodium was 124, potassium 4.9, glucose was 151, BUN 21, creatinine 1.02.  Her first set of troponin-I was 16.12. Repeat troponin-I were 64, 30.34, 24.69, this morning it is 13.32 which is trending down.  This morning labs; sodium 132, potassium 4.6, BUN 11, creatinine 0.99.  Hemoglobin 11.2,  hematocrit 35.3, white count of 8.2. 2D echo done on January 03, 2015, showed mild concentric LVH, good LV systolic function.  Grade 1 diastolic dysfunction.  No significant wall motion abnormalities were noted.  EKG post-cardiac catheterization showed normal sinus rhythm with no acute ischemic changes.  EKG today showed normal sinus rhythm with poor R-wave progression in anterolateral leads which is probably related to lead placement.  BRIEF HOSPITAL COURSE:  The patient was directly taken to the cardiac cath lab and underwent left cardiac cath with selective left and right coronary angiography as per procedure report.  The patient tolerated the procedure well.  There were no complications.  Postprocedure, the patient did not have any episodes of anginal chest pain.  Her groin is stable with no evidence of hematoma or bruit.  The patient is ambulating in room without any problems.  Her troponin-I is trending down.  The patient did not have any significant epicardial coronary artery stenosis.  Her markedly elevated cardiac enzymes were felt to be due to prolonged ischemia due to vasospasm with myonecrosis.  Cardiac enzymes are trending down.  The patient will be discharged home on above medications and will be followed up in my office in 1 week.     Abigail Lai. Terrence Dupont, M.D.     [facility]MED[facility]  D:  01/04/2015  T:  01/05/2015  Job:  [id number]

## 2015-01-11 DIAGNOSIS — I10 Essential (primary) hypertension: Secondary | ICD-10-CM | POA: Diagnosis not present

## 2015-01-11 DIAGNOSIS — I2510 Atherosclerotic heart disease of native coronary artery without angina pectoris: Secondary | ICD-10-CM | POA: Diagnosis not present

## 2015-01-11 DIAGNOSIS — E785 Hyperlipidemia, unspecified: Secondary | ICD-10-CM | POA: Diagnosis not present

## 2015-01-11 DIAGNOSIS — E119 Type 2 diabetes mellitus without complications: Secondary | ICD-10-CM | POA: Diagnosis not present

## 2015-01-11 DIAGNOSIS — M1990 Unspecified osteoarthritis, unspecified site: Secondary | ICD-10-CM | POA: Diagnosis not present

## 2015-01-11 DIAGNOSIS — E039 Hypothyroidism, unspecified: Secondary | ICD-10-CM | POA: Diagnosis not present

## 2015-01-11 DIAGNOSIS — D649 Anemia, unspecified: Secondary | ICD-10-CM | POA: Diagnosis not present

## 2015-01-11 DIAGNOSIS — K219 Gastro-esophageal reflux disease without esophagitis: Secondary | ICD-10-CM | POA: Diagnosis not present

## 2015-01-11 DIAGNOSIS — E559 Vitamin D deficiency, unspecified: Secondary | ICD-10-CM | POA: Diagnosis not present

## 2015-01-11 DIAGNOSIS — I214 Non-ST elevation (NSTEMI) myocardial infarction: Secondary | ICD-10-CM | POA: Diagnosis not present

## 2015-01-13 ENCOUNTER — Other Ambulatory Visit (HOSPITAL_BASED_OUTPATIENT_CLINIC_OR_DEPARTMENT_OTHER): Payer: Medicare Other

## 2015-01-13 ENCOUNTER — Ambulatory Visit (HOSPITAL_BASED_OUTPATIENT_CLINIC_OR_DEPARTMENT_OTHER): Payer: Medicare Other

## 2015-01-13 VITALS — BP 131/60 | HR 100 | Temp 97.8°F | Resp 20

## 2015-01-13 DIAGNOSIS — D638 Anemia in other chronic diseases classified elsewhere: Secondary | ICD-10-CM | POA: Diagnosis present

## 2015-01-13 LAB — CBC WITH DIFFERENTIAL/PLATELET
BASO%: 0.4 % (ref 0.0–2.0)
Basophils Absolute: 0 10*3/uL (ref 0.0–0.1)
EOS%: 4.9 % (ref 0.0–7.0)
Eosinophils Absolute: 0.2 10*3/uL (ref 0.0–0.5)
HCT: 34.8 % (ref 34.8–46.6)
HGB: 10.9 g/dL — ABNORMAL LOW (ref 11.6–15.9)
LYMPH%: 24.8 % (ref 14.0–49.7)
MCH: 25.5 pg (ref 25.1–34.0)
MCHC: 31.2 g/dL — ABNORMAL LOW (ref 31.5–36.0)
MCV: 81.8 fL (ref 79.5–101.0)
MONO#: 0.3 10*3/uL (ref 0.1–0.9)
MONO%: 5.6 % (ref 0.0–14.0)
NEUT#: 3.2 10*3/uL (ref 1.5–6.5)
NEUT%: 64.3 % (ref 38.4–76.8)
Platelets: 198 10*3/uL (ref 145–400)
RBC: 4.25 10*6/uL (ref 3.70–5.45)
RDW: 15.1 % — ABNORMAL HIGH (ref 11.2–14.5)
WBC: 5 10*3/uL (ref 3.9–10.3)
lymph#: 1.2 10*3/uL (ref 0.9–3.3)

## 2015-01-13 MED ORDER — DARBEPOETIN ALFA 200 MCG/0.4ML IJ SOSY
200.0000 ug | PREFILLED_SYRINGE | Freq: Once | INTRAMUSCULAR | Status: AC
  Administered 2015-01-13: 200 ug via SUBCUTANEOUS
  Filled 2015-01-13: qty 0.4

## 2015-01-13 NOTE — Patient Instructions (Signed)
Darbepoetin Alfa injection What is this medicine? DARBEPOETIN ALFA (dar be POE e tin AL fa) helps your body make more red blood cells. It is used to treat anemia caused by chronic kidney failure and chemotherapy. This medicine may be used for other purposes; ask your health care provider or pharmacist if you have questions. What should I tell my health care provider before I take this medicine? They need to know if you have any of these conditions: -blood clotting disorders or history of blood clots -cancer patient not on chemotherapy -cystic fibrosis -heart disease, such as angina, heart failure, or a history of a heart attack -hemoglobin level of 12 g/dL or greater -high blood pressure -low levels of folate, iron, or vitamin B12 -seizures -an unusual or allergic reaction to darbepoetin, erythropoietin, albumin, hamster proteins, latex, other medicines, foods, dyes, or preservatives -pregnant or trying to get pregnant -breast-feeding How should I use this medicine? This medicine is for injection into a vein or under the skin. It is usually given by a health care professional in a hospital or clinic setting. If you get this medicine at home, you will be taught how to prepare and give this medicine. Do not shake the solution before you withdraw a dose. Use exactly as directed. Take your medicine at regular intervals. Do not take your medicine more often than directed. It is important that you put your used needles and syringes in a special sharps container. Do not put them in a trash can. If you do not have a sharps container, call your pharmacist or healthcare provider to get one. Talk to your pediatrician regarding the use of this medicine in children. While this medicine may be used in children as young as 1 year for selected conditions, precautions do apply. Overdosage: If you think you have taken too much of this medicine contact a poison control center or emergency room at once. NOTE:  This medicine is only for you. Do not share this medicine with others. What if I miss a dose? If you miss a dose, take it as soon as you can. If it is almost time for your next dose, take only that dose. Do not take double or extra doses. What may interact with this medicine? Do not take this medicine with any of the following medications: -epoetin alfa This list may not describe all possible interactions. Give your health care provider a list of all the medicines, herbs, non-prescription drugs, or dietary supplements you use. Also tell them if you smoke, drink alcohol, or use illegal drugs. Some items may interact with your medicine. What should I watch for while using this medicine? Visit your prescriber or health care professional for regular checks on your progress and for the needed blood tests and blood pressure measurements. It is especially important for the doctor to make sure your hemoglobin level is in the desired range, to limit the risk of potential side effects and to give you the best benefit. Keep all appointments for any recommended tests. Check your blood pressure as directed. Ask your doctor what your blood pressure should be and when you should contact him or her. As your body makes more red blood cells, you may need to take iron, folic acid, or vitamin B supplements. Ask your doctor or health care provider which products are right for you. If you have kidney disease continue dietary restrictions, even though this medication can make you feel better. Talk with your doctor or health care professional about the   foods you eat and the vitamins that you take. What side effects may I notice from receiving this medicine? Side effects that you should report to your doctor or health care professional as soon as possible: -allergic reactions like skin rash, itching or hives, swelling of the face, lips, or tongue -breathing problems -changes in vision -chest pain -confusion, trouble speaking  or understanding -feeling faint or lightheaded, falls -high blood pressure -muscle aches or pains -pain, swelling, warmth in the leg -rapid weight gain -severe headaches -sudden numbness or weakness of the face, arm or leg -trouble walking, dizziness, loss of balance or coordination -seizures (convulsions) -swelling of the ankles, feet, hands -unusually weak or tired Side effects that usually do not require medical attention (report to your doctor or health care professional if they continue or are bothersome): -diarrhea -fever, chills (flu-like symptoms) -headaches -nausea, vomiting -redness, stinging, or swelling at site where injected This list may not describe all possible side effects. Call your doctor for medical advice about side effects. You may report side effects to FDA at 1-800-FDA-1088. Where should I keep my medicine? Keep out of the reach of children. Store in a refrigerator between 2 and 8 degrees C (36 and 46 degrees F). Do not freeze. Do not shake. Throw away any unused portion if using a single-dose vial. Throw away any unused medicine after the expiration date. NOTE: This sheet is a summary. It may not cover all possible information. If you have questions about this medicine, talk to your doctor, pharmacist, or health care provider.    2016, Elsevier/Gold Standard. (2008-02-23 10:23:57)  

## 2015-01-16 ENCOUNTER — Ambulatory Visit (INDEPENDENT_AMBULATORY_CARE_PROVIDER_SITE_OTHER): Payer: Medicare Other | Admitting: Endocrinology

## 2015-01-16 ENCOUNTER — Other Ambulatory Visit: Payer: Self-pay | Admitting: *Deleted

## 2015-01-16 ENCOUNTER — Encounter: Payer: Self-pay | Admitting: Endocrinology

## 2015-01-16 VITALS — BP 116/60 | HR 69 | Temp 98.3°F | Resp 14 | Ht 61.0 in | Wt 115.4 lb

## 2015-01-16 DIAGNOSIS — E7800 Pure hypercholesterolemia, unspecified: Secondary | ICD-10-CM

## 2015-01-16 DIAGNOSIS — Z23 Encounter for immunization: Secondary | ICD-10-CM

## 2015-01-16 DIAGNOSIS — E1165 Type 2 diabetes mellitus with hyperglycemia: Secondary | ICD-10-CM | POA: Diagnosis not present

## 2015-01-16 NOTE — Patient Instructions (Addendum)
Reduce Gabapentin to 3 x daily  May take Glyset 3x daily just before meals  Check blood sugars on waking up   times a week Also check blood sugars about 2 hours after a meal and do this after different meals by rotation  Recommended blood sugar levels on waking up is 90-130 and about 2 hours after meal is 130-160  Please bring your blood sugar monitor to each visit, thank you  Take 500mg  calcium daily

## 2015-01-16 NOTE — Progress Notes (Signed)
Patient ID: Abigail Hoover, female   DOB: [birth year], 69 y.o.   MRN: [id number]    Reason for Appointment:  follow-up of various issues  History of Present Illness   Diagnosis: Type 2 DIABETES MELITUS, long-standing  She has had mild diabetes for several years but has required multiple drugs for control Usually has had postprandial hyperglycemia, this has been better controlled with adding Glyset to her main meals Has never been on insulin or a GLP-1 drug Actos had been stopped previously because of tendency to edema  Recent history:  Her A1c is consistent in the upper normal range but it is relatively higher now which she thinks is from getting steroids for work sounds like sciatica from her orthopedic surgeon  She is on a reduced dose of metformin, 500 mg twice a day because of some tendency to low sugars with higher doses She recently was discharged from the hospital and her sugars were higher also since she was not getting all her medications consistently Again has not done any walking for exercise for various reasons She does try to take her Glyset right before eating breakfast and dinner and she does not think she is eating lunch again WelChol was stopped on the last visit   Oral hypoglycemic drugs:   Glyset 50 mg before breakfast and dinner, Janumet 50/ 500 twice a day  Side effects from medications: None Proper timing of medications in relation to meals: Yes.          Monitors blood glucose: 1-2 times a day.    Glucometer: One Touch       Blood Glucose readings from recall: Recently about 108-120, previously as high as 300 on steroids    Meals: 2- 3 meals per day.  eating small breakfast but mostly with carbohydrates    Physical activity: exercise: No programmed activity, she says she is active with work             Dietician visit: Most recent: Unknown       Complications: are mild symptoms of neuropathy, no nephropathy or retinopathy   Wt Readings from Last 3  Encounters:  01/16/15 115 lb 6.4 oz (52.345 kg)  01/04/15 112 lb 7 oz (51 kg)  12/12/14 111 lb (50.349 kg)   Lab Results  Component Value Date   HGBA1C 6.9* 01/02/2015   HGBA1C 6.3 12/07/2014   HGBA1C 6.3 05/13/2014   Lab Results  Component Value Date   MICROALBUR 1.7 11/05/2012   LDLCALC 52 01/03/2015   CREATININE 0.99 01/04/2015    Microalbumin/creatinine ratio on 02/18/14 = 37   OTHER problems are in review of systems:      Medication List       This list is accurate as of: 01/16/15  5:17 PM.  Always use your most recent med list.               aspirin 81 MG tablet  Take 81 mg by mouth daily.     DEXILANT 60 MG capsule  Generic drug:  dexlansoprazole  Take 60 mg by mouth daily.     gabapentin 300 MG capsule  Commonly known as:  NEURONTIN  Take 300 mg by mouth 4 (four) times daily.     GLYSET 50 MG tablet  Generic drug:  miglitol  ONE TABLET BY MOUTH TWICE A DAY     ibandronate 150 MG tablet  Commonly known as:  BONIVA  TAKE 1 TAB ONCE A MONTH IN THE AM ON  AN EMPTY STOMACH WITH FULL GLAS OF WATER STAY UPRIGHT 30 MIN     isosorbide mononitrate 60 MG 24 hr tablet  Commonly known as:  IMDUR  Take 1 tablet (60 mg total) by mouth daily.     JANUMET 50-500 MG tablet  Generic drug:  sitaGLIPtin-metformin  TAKE 1 TABLET BY MOUTH 2 (TWO) TIMES DAILY WITH A MEAL.     levothyroxine 88 MCG tablet  Commonly known as:  SYNTHROID, LEVOTHROID  Take 1 tablet (88 mcg total) by mouth daily.     losartan 100 MG tablet  Commonly known as:  COZAAR  Take 100 mg by mouth daily.     losartan 50 MG tablet  Commonly known as:  COZAAR  Take 50 mg by mouth daily.     metoprolol succinate 50 MG 24 hr tablet  Commonly known as:  TOPROL-XL  Take 1 tablet (50 mg total) by mouth daily. Take with or immediately following a meal.     MYRBETRIQ 25 MG Tb24 tablet  Generic drug:  mirabegron ER  Take 1 tablet by mouth Daily.     nitroGLYCERIN 0.4 MG SL tablet  Commonly  known as:  NITROSTAT  Place 1 tablet (0.4 mg total) under the tongue every 5 (five) minutes x 3 doses as needed for chest pain.     ONE TOUCH ULTRA 2 W/DEVICE Kit  Use to check blood sugars 1 time per day     pentosan polysulfate 100 MG capsule  Commonly known as:  ELMIRON  Take 100 mg by mouth 3 (three) times daily before meals.     pravastatin 20 MG tablet  Commonly known as:  PRAVACHOL  Take 20 mg by mouth daily.     vitamin B-12 1000 MCG tablet  Commonly known as:  CYANOCOBALAMIN  Take 1,000 mcg by mouth daily.     Vitamin D-3 1000 UNITS Caps  Take 2 capsules by mouth daily.        Allergies:  Allergies  Allergen Reactions  . Ezetimibe Itching  . Atorvastatin     REACTION: rash    Past Medical History  Diagnosis Date  . Diabetes mellitus   . Hypertension   . Thyroid disease   . Arthritis   . Anemia   . GERD (gastroesophageal reflux disease)   . Hypercholesterolemia   . Bladder irritation   . Iron deficiency anemia, unspecified   . Hypothyroidism   . Anxiety   . Psoriasis   . Deficiency anemia 05/27/2014    Past Surgical History  Procedure Laterality Date  . Abdominal hysterectomy      TAH  . Cervical disc surgery    . Cataract extraction    . Breast biopsy      Left breast biopsy benign lesion  . Colonoscopy  2013    neg. next 2023.   Marland Kitchen Eye surgery    . Breast biopsy  03/11/2012    Procedure: BREAST BIOPSY;  Surgeon: Odis Hollingshead, MD;  Location: [facility];  Service: General;  Laterality: Left;  remove left breast mass  . Cardiac catheterization N/A 01/02/2015    Procedure: Left Heart Cath and Coronary Angiography;  Surgeon: Charolette Forward, MD;  Location: [facility];  Service: Cardiovascular;  Laterality: N/A;    Family History  Problem Relation Age of Onset  . Breast cancer Sister   . Cancer Sister     breast cancer  . Heart disease Mother     heart attack  .  Stroke Father     brain hemorrhage  . Diabetes Brother   . Cancer Brother      Social History:  reports that she has never smoked. She has never used smokeless tobacco. She reports that she does not drink alcohol or use illicit drugs.  Review of Systems:  HYPERTENSION:  this is well-controlled with losartan 100 mg along with metoprolol , now taking ER 50 mg daily She has had follow-up with cardiologist since her recent hospitalization.   CAD: She had a cardiac catheterization which showed no significant disease but she probably had vasospasm and probable MI according to recent hospital discharge information  RENAL dysfunction: Because of her creatinine being higher than usual she was told to take Lasix every other day and renal function is better, labs done in the hospital  Lab Results  Component Value Date   CREATININE 0.99 01/04/2015   BUN 11 01/04/2015   NA 132* 01/04/2015   K 4.6 01/04/2015   CL 101 01/04/2015   CO2 27 01/04/2015     HYPERLIPIDEMIA: The lipid abnormality consists of elevated LDL which is mild and well controlled with pravastatin started on her last visit instead of WelChol She thinks she had a rash from Lipitor but has not tried any other statin drugs  Lab Results  Component Value Date   CHOL 127 01/03/2015   HDL 63 01/03/2015   LDLCALC 52 01/03/2015   TRIG 60 01/03/2015   CHOLHDL 2.0 01/03/2015    She has a long history of primary hypothyroidism which has been fairly stable and TSH consistently normal with 88 g She takes this on an empty stomach early in the morning and will take her WelChol about 4-5 hours later  TSH has been stable on current regimen  Lab Results  Component Value Date   TSH 0.97 12/07/2014   TSH 1.090 12/07/2014   TSH 2.220 04/26/2013   FREET4 1.69* 12/07/2014   FREET4 2.13* 12/07/2014   FREET4 1.38 03/08/2013    NEUROPATHY:  She does have occasional numbness in her feet and some burning but not recently, is on gabapentin from urologist for bladder irritability and  discomfort without much  relief  History of iron deficiency anemia; currently not taking iron and is getting  Aranesp    Lab Results  Component Value Date   WBC 5.0 01/13/2015   HGB 10.9* 01/13/2015   HCT 34.8 01/13/2015   MCV 81.8 01/13/2015   PLT 198 01/13/2015       Diabetic foot exam in 9/15: No decrease in monofilament sensation.  Reduced pulses left foot  OSTEOPENIA: Her T score  done by gynecologist was -2.1, currently on Boniva started in 01/2014   She is asking about taking Citracal calcium.  Also appears that her calcium is relatively low recently even though her albumin level is low also   Examination:   BP 116/60 mmHg  Pulse 69  Temp(Src) 98.3 F (36.8 C)  Resp 14  Ht _0  (1.549 m)  Wt 115 lb 6.4 oz (52.345 kg)  BMI 21.82 kg/m2  SpO2 99%  Body mass index is 21.82 kg/(m^2).    Trace ankle edema  ASSESSMENT/ PLAN:   Diabetes type 2   The patient's diabetes control is  fairly good with her multidrug regimen although she does have higher readings overall because of getting steroids and acute illness A1c is slightly higher as a result Blood sugars are not higher with stopping WelChol Recent blood sugars not reviewed as  she did not bring her monitor for download today She is asking about increasing her Glyset if she is eating 3 meals a day when going to [place] Also encouraged her to start walking daily She will continue to monitor readings after meals also  Otherwise she will continue her regimen  RENAL dysfunction: Creatinine is better with taking Lasix every other day She can also reduce her gabapentin to 3 times a day as she is not benefiting from it much  HYPERLIPIDEMIA:  She will continue 20 mg pravastatin as she does need to be on a statin drug and her recent lipids are doing well Has not had any side effects or rash from this  Edema: This is well-controlled with Lasix every other day and she can continue  Mild hypocalcemia: May be nutritional but also has low albumin.   She can start taking OTC calcium supplements, will need to assess her vitamin D level   Patient Instructions  Reduce Gabapentin to 3 x daily  May take Glyset 3x daily just before meals  Check blood sugars on waking up   times a week Also check blood sugars about 2 hours after a meal and do this after different meals by rotation  Recommended blood sugar levels on waking up is 90-130 and about 2 hours after meal is 130-160  Please bring your blood sugar monitor to each visit, thank you  Take 523m calcium daily    Total visit time for review of multiple problems, counseling, review of hospital records, labs = 25 minutes  Trina Asch 01/16/2015, 5:17 PM

## 2015-01-17 DIAGNOSIS — E785 Hyperlipidemia, unspecified: Secondary | ICD-10-CM | POA: Diagnosis not present

## 2015-01-17 DIAGNOSIS — R109 Unspecified abdominal pain: Secondary | ICD-10-CM | POA: Diagnosis not present

## 2015-01-17 DIAGNOSIS — E1165 Type 2 diabetes mellitus with hyperglycemia: Secondary | ICD-10-CM | POA: Diagnosis not present

## 2015-01-17 DIAGNOSIS — I2510 Atherosclerotic heart disease of native coronary artery without angina pectoris: Secondary | ICD-10-CM | POA: Diagnosis not present

## 2015-01-17 DIAGNOSIS — Z1389 Encounter for screening for other disorder: Secondary | ICD-10-CM | POA: Diagnosis not present

## 2015-01-17 DIAGNOSIS — Z0000 Encounter for general adult medical examination without abnormal findings: Secondary | ICD-10-CM | POA: Diagnosis not present

## 2015-01-17 DIAGNOSIS — I10 Essential (primary) hypertension: Secondary | ICD-10-CM | POA: Diagnosis not present

## 2015-01-25 ENCOUNTER — Other Ambulatory Visit: Payer: Self-pay | Admitting: Endocrinology

## 2015-01-25 ENCOUNTER — Telehealth: Payer: Self-pay | Admitting: Endocrinology

## 2015-01-25 ENCOUNTER — Other Ambulatory Visit: Payer: Self-pay | Admitting: *Deleted

## 2015-01-25 MED ORDER — SITAGLIPTIN PHOS-METFORMIN HCL 50-500 MG PO TABS: ORAL_TABLET | ORAL | Status: DC

## 2015-01-25 MED ORDER — MIGLITOL 50 MG PO TABS: ORAL_TABLET | ORAL | Status: DC

## 2015-01-25 NOTE — Telephone Encounter (Signed)
Pt needs 3 mon supply of furosemide, ibandrunet if we can

## 2015-01-25 NOTE — Telephone Encounter (Signed)
rx's sent for 90 day supply, per patient request

## 2015-03-02 ENCOUNTER — Telehealth: Payer: Self-pay | Admitting: *Deleted

## 2015-03-02 NOTE — Telephone Encounter (Signed)
I will get [facility] to fax records

## 2015-03-02 NOTE — Telephone Encounter (Signed)
TC from pt's son. He states he sent Dr. Alvy Bimler an email last night and is calling this morning to check to make sure she received it.  He is requesting pt's medical hisory to be emailed to him for her physicians there.  They have requested more information as it relates to her anemia and treatment of her anemia, specifically related to the aranesp injections used to keep HGB >10.  His email address is [email or web address]   Phone contact #'s are [phone] or [phone]

## 2015-03-03 ENCOUNTER — Telehealth: Payer: Self-pay | Admitting: *Deleted

## 2015-03-03 NOTE — Telephone Encounter (Signed)
Called pt's gran nephew, Tanis Brecker, to inform that our Medical Records Staff did email pt's records to the MD in [place] today.  He was very [employer] attorney.

## 2015-03-03 NOTE — Telephone Encounter (Signed)
Call received from "patient's grand nephew inquiring about status of records being sent to the doctor in [place].   To continue injections, the doctor needs her history, summary, lab results etc.  Asked for fax number to provide to collaborative and [facility]  Call transferred to collaborative at his request.

## 2015-03-07 ENCOUNTER — Telehealth: Payer: Self-pay | Admitting: *Deleted

## 2015-03-07 NOTE — Telephone Encounter (Signed)
Call from patient's [place] nephew.  Reports he has "not received e-mail from [facility] with the records.  I need to receive this to sort it from my tablet.  The doctors in [place] will not entertain it unless it comes from me.  My e-mail is [email or web address]."  Call transferred to [facility]

## 2015-03-23 ENCOUNTER — Other Ambulatory Visit: Payer: Self-pay | Admitting: Hematology and Oncology

## 2015-05-01 ENCOUNTER — Encounter: Payer: Self-pay | Admitting: Hematology and Oncology

## 2015-05-01 ENCOUNTER — Telehealth: Payer: Self-pay | Admitting: *Deleted

## 2015-05-01 ENCOUNTER — Other Ambulatory Visit: Payer: Self-pay | Admitting: Hematology and Oncology

## 2015-05-01 ENCOUNTER — Telehealth: Payer: Self-pay | Admitting: Hematology and Oncology

## 2015-05-01 DIAGNOSIS — D539 Nutritional anemia, unspecified: Secondary | ICD-10-CM

## 2015-05-01 DIAGNOSIS — D631 Anemia in chronic kidney disease: Secondary | ICD-10-CM | POA: Insufficient documentation

## 2015-05-01 DIAGNOSIS — N189 Chronic kidney disease, unspecified: Principal | ICD-10-CM

## 2015-05-01 HISTORY — DX: Anemia in chronic kidney disease: D63.1

## 2015-05-01 HISTORY — DX: Anemia in chronic kidney disease: N18.9

## 2015-05-01 NOTE — Telephone Encounter (Signed)
Pt called- states she got home from [place] on Friday. Needs appts.

## 2015-05-01 NOTE — Telephone Encounter (Signed)
Lft msg for pt confirming labs for tomorrow and MD visit with inj per 02/06 POF.... Mailed out schedule

## 2015-05-01 NOTE — Telephone Encounter (Signed)
I placed POF for labs tomorrow and see her on 2/14 with possible injections There are new medicare rules for Aranesp so we have to start the whole process new

## 2015-05-02 ENCOUNTER — Other Ambulatory Visit: Payer: Medicare Other

## 2015-05-02 ENCOUNTER — Telehealth: Payer: Self-pay | Admitting: Hematology and Oncology

## 2015-05-02 NOTE — Telephone Encounter (Signed)
Pt called to r/s labs forgot apt, confirmed labs for tomorrow and pt will p/u schedule... KJ

## 2015-05-03 ENCOUNTER — Other Ambulatory Visit (HOSPITAL_BASED_OUTPATIENT_CLINIC_OR_DEPARTMENT_OTHER): Payer: Medicare Other

## 2015-05-03 DIAGNOSIS — D638 Anemia in other chronic diseases classified elsewhere: Secondary | ICD-10-CM

## 2015-05-03 DIAGNOSIS — D631 Anemia in chronic kidney disease: Secondary | ICD-10-CM

## 2015-05-03 DIAGNOSIS — N189 Chronic kidney disease, unspecified: Secondary | ICD-10-CM

## 2015-05-03 DIAGNOSIS — D539 Nutritional anemia, unspecified: Secondary | ICD-10-CM

## 2015-05-03 LAB — CBC WITH DIFFERENTIAL/PLATELET
BASO%: 0.2 % (ref 0.0–2.0)
Basophils Absolute: 0 10*3/uL (ref 0.0–0.1)
EOS%: 3.8 % (ref 0.0–7.0)
Eosinophils Absolute: 0.3 10*3/uL (ref 0.0–0.5)
HCT: 34.2 % — ABNORMAL LOW (ref 34.8–46.6)
HGB: 10.6 g/dL — ABNORMAL LOW (ref 11.6–15.9)
LYMPH%: 27 % (ref 14.0–49.7)
MCH: 25.5 pg (ref 25.1–34.0)
MCHC: 31 g/dL — ABNORMAL LOW (ref 31.5–36.0)
MCV: 82.4 fL (ref 79.5–101.0)
MONO#: 0.3 10*3/uL (ref 0.1–0.9)
MONO%: 5.2 % (ref 0.0–14.0)
NEUT#: 4.2 10*3/uL (ref 1.5–6.5)
NEUT%: 63.8 % (ref 38.4–76.8)
Platelets: 228 10*3/uL (ref 145–400)
RBC: 4.15 10*6/uL (ref 3.70–5.45)
RDW: 17.1 % — ABNORMAL HIGH (ref 11.2–14.5)
WBC: 6.5 10*3/uL (ref 3.9–10.3)
lymph#: 1.8 10*3/uL (ref 0.9–3.3)

## 2015-05-09 ENCOUNTER — Ambulatory Visit (HOSPITAL_BASED_OUTPATIENT_CLINIC_OR_DEPARTMENT_OTHER): Payer: Medicare Other | Admitting: Hematology and Oncology

## 2015-05-09 ENCOUNTER — Encounter: Payer: Self-pay | Admitting: Hematology and Oncology

## 2015-05-09 ENCOUNTER — Ambulatory Visit: Payer: Medicare Other

## 2015-05-09 VITALS — BP 140/52 | HR 68 | Temp 97.4°F | Resp 18 | Ht 61.0 in | Wt 112.3 lb

## 2015-05-09 DIAGNOSIS — D638 Anemia in other chronic diseases classified elsewhere: Secondary | ICD-10-CM | POA: Diagnosis not present

## 2015-05-09 DIAGNOSIS — D539 Nutritional anemia, unspecified: Secondary | ICD-10-CM

## 2015-05-09 DIAGNOSIS — N63 Unspecified lump in unspecified breast: Secondary | ICD-10-CM

## 2015-05-09 DIAGNOSIS — N189 Chronic kidney disease, unspecified: Secondary | ICD-10-CM

## 2015-05-09 DIAGNOSIS — D631 Anemia in chronic kidney disease: Secondary | ICD-10-CM

## 2015-05-09 NOTE — Assessment & Plan Note (Signed)
Her examination is benign. Her recent mammogram from August 2016 was within normal limits. I reassured the patient  That the palpable lump at her prior lumpectomy scar is benign , likely related to scar tissue

## 2015-05-09 NOTE — Assessment & Plan Note (Signed)
This is likely anemia of chronic disease. The patient denies recent history of bleeding such as epistaxis, hematuria or hematochezia.  She is responding very well to darbepoetin.  since we discontinue treatment for the last 4 months, her hemoglobin is stable. I reviewed the current Medicare guidelines with the patient and told her that we cannot restart Aranesp until her hemoglobin drifted under 10 g. I will see her back in 3 months for further blood work and examination

## 2015-05-09 NOTE — Progress Notes (Signed)
[name] OFFICE PROGRESS NOTE  [name], MD SUMMARY OF HEMATOLOGIC HISTORY:  This is a patient that has been followed here 4 years for chronic anemia. In 2011, she had a bone marrow aspirate and biopsy that came back normal. The patient has been receiving Aranesp injection for chronic anemia up until 2014. It was subsequently discontinued due stability of the anemia. Starting 05/27/2014, darbepoetin was resumed at 200 g every other month to keep hemoglobin greater than 10 g. Treatment was discontinued in October 2016 INTERVAL HISTORY: Abigail Hoover 70 y.o. female returns for  Further follow-up. She complained of chronic fatigue.  she denies recent infection. She just returned from [place] after 3 months  Vacation. I have reviewed the past medical history, past surgical history, social history and family history with the patient and they are unchanged from previous note.  ALLERGIES:  is allergic to ezetimibe and atorvastatin.  MEDICATIONS:  Current Outpatient Prescriptions  Medication Sig Dispense Refill  . aspirin 81 MG tablet Take 81 mg by mouth daily.      . Blood Glucose Monitoring Suppl (ONE TOUCH ULTRA 2) W/DEVICE KIT Use to check blood sugars 1 time per day 1 each 0  . dexlansoprazole (DEXILANT) 60 MG capsule Take 60 mg by mouth daily.      Marland Kitchen gabapentin (NEURONTIN) 300 MG capsule Take 300 mg by mouth 4 (four) times daily.    Marland Kitchen GLYSET 50 MG tablet TAKE ONE TABLET BY MOUTH TWICE A DAY 180 tablet 1  . ibandronate (BONIVA) 150 MG tablet TAKE 1 TAB ONCE A MONTH IN THE AM ON AN EMPTY STOMACH WITH FULL GLAS OF WATER STAY UPRIGHT 30 MIN 1 tablet 5  . isosorbide mononitrate (IMDUR) 60 MG 24 hr tablet Take 1 tablet (60 mg total) by mouth daily. 30 tablet 3  . levothyroxine (SYNTHROID, LEVOTHROID) 88 MCG tablet Take 1 tablet (88 mcg total) by mouth daily. 90 tablet 1  . losartan (COZAAR) 100 MG tablet Take 100 mg by mouth daily.    Marland Kitchen losartan (COZAAR) 50 MG tablet  Take 50 mg by mouth daily.  3  . metoprolol succinate (TOPROL-XL) 50 MG 24 hr tablet Take 1 tablet (50 mg total) by mouth daily. Take with or immediately following a meal. 30 tablet 3  . miglitol (GLYSET) 50 MG tablet ONE TABLET BY MOUTH Three times A DAY 270 tablet 1  . MYRBETRIQ 25 MG TB24 Take 1 tablet by mouth Daily.    . nitroGLYCERIN (NITROSTAT) 0.4 MG SL tablet Place 1 tablet (0.4 mg total) under the tongue every 5 (five) minutes x 3 doses as needed for chest pain. 25 tablet 12  . pentosan polysulfate (ELMIRON) 100 MG capsule Take 100 mg by mouth 3 (three) times daily before meals.     . pravastatin (PRAVACHOL) 20 MG tablet Take 20 mg by mouth daily.    . sitaGLIPtin-metformin (JANUMET) 50-500 MG tablet TAKE 1 TABLET BY MOUTH 2 (TWO) TIMES DAILY WITH A MEAL. 180 tablet 1   No current facility-administered medications for this visit.     REVIEW OF SYSTEMS:   Constitutional: Denies fevers, chills or night sweats Eyes: Denies blurriness of vision Ears, nose, mouth, throat, and face: Denies mucositis or sore throat Respiratory: Denies cough, dyspnea or wheezes Cardiovascular: Denies palpitation, chest discomfort or lower extremity swelling Gastrointestinal:  Denies nausea, heartburn or change in bowel habits Skin: Denies abnormal skin rashes Lymphatics: Denies new lymphadenopathy or easy bruising Neurological:Denies numbness, tingling or new weaknesses  Behavioral/Psych: Mood is stable, no new changes  All other systems were reviewed with the patient and are negative.  PHYSICAL EXAMINATION: ECOG PERFORMANCE STATUS: 0 - Asymptomatic  Filed Vitals:   05/09/15 1135  BP: 140/52  Pulse: 68  Temp: 97.4 F (36.3 C)  Resp: 18   Filed Weights   05/09/15 1135  Weight: 112 lb 4.8 oz (50.939 kg)    GENERAL:alert, no distress and comfortable SKIN: skin color, texture, turgor are normal, no rashes or significant lesions EYES: normal, Conjunctiva are pink and non-injected, sclera  clear OROPHARYNX:no exudate, no erythema and lips, buccal mucosa, and tongue normal  NECK: supple, thyroid normal size, non-tender, without nodularity LYMPH:  no palpable lymphadenopathy in the cervical, axillary or inguinal LUNGS: clear to auscultation and percussion with normal breathing effort HEART: regular rate & rhythm and no murmurs and no lower extremity edema ABDOMEN:abdomen soft, non-tender and normal bowel sounds Musculoskeletal:no cyanosis of digits and no clubbing  NEURO: alert & oriented x 3 with fluent speech, no focal motor/sensory deficits  bilateral breast examination revealed fibrocystic change. There is a lumpectomy scar on the left breast with palpable scar tissue underneath  LABORATORY DATA:  I have reviewed the data as listed No results found for this or any previous visit (from the past 48 hour(s)).  Lab Results  Component Value Date   WBC 6.5 05/03/2015   HGB 10.6* 05/03/2015   HCT 34.2* 05/03/2015   MCV 82.4 05/03/2015   PLT 228 05/03/2015   ASSESSMENT & PLAN:  Anemia, chronic disease This is likely anemia of chronic disease. The patient denies recent history of bleeding such as epistaxis, hematuria or hematochezia.  She is responding very well to darbepoetin.  since we discontinue treatment for the last 4 months, her hemoglobin is stable. I reviewed the current Medicare guidelines with the patient and told her that we cannot restart Aranesp until her hemoglobin drifted under 10 g. I will see her back in 3 months for further blood work and examination  Breast mass in female-left  Her examination is benign. Her recent mammogram from August 2016 was within normal limits. I reassured the patient  That the palpable lump at her prior lumpectomy scar is benign , likely related to scar tissue   All questions were answered. The patient knows to call the clinic with any problems, questions or concerns. No barriers to learning was detected.  I spent 15 minutes  counseling the patient face to face. The total time spent in the appointment was 20 minutes and more than 50% was on counseling.     [facility], Draeden Kellman, MD 2/14/201711:55 AM

## 2015-05-10 ENCOUNTER — Telehealth: Payer: Self-pay | Admitting: Hematology and Oncology

## 2015-05-10 NOTE — Telephone Encounter (Signed)
per pof to sch pt appt-cld & spoke to pt and gave pt time & date of appt for 5/15

## 2015-05-18 ENCOUNTER — Other Ambulatory Visit: Payer: Self-pay | Admitting: Hematology and Oncology

## 2015-05-19 DIAGNOSIS — M4722 Other spondylosis with radiculopathy, cervical region: Secondary | ICD-10-CM | POA: Diagnosis not present

## 2015-05-19 DIAGNOSIS — M5136 Other intervertebral disc degeneration, lumbar region: Secondary | ICD-10-CM | POA: Diagnosis not present

## 2015-05-19 DIAGNOSIS — M542 Cervicalgia: Secondary | ICD-10-CM | POA: Diagnosis not present

## 2015-05-19 DIAGNOSIS — M5030 Other cervical disc degeneration, unspecified cervical region: Secondary | ICD-10-CM | POA: Diagnosis not present

## 2015-05-19 DIAGNOSIS — M546 Pain in thoracic spine: Secondary | ICD-10-CM | POA: Diagnosis not present

## 2015-05-24 ENCOUNTER — Other Ambulatory Visit: Payer: Self-pay | Admitting: Neurosurgery

## 2015-05-24 DIAGNOSIS — Z9189 Other specified personal risk factors, not elsewhere classified: Secondary | ICD-10-CM | POA: Diagnosis not present

## 2015-05-24 DIAGNOSIS — M5030 Other cervical disc degeneration, unspecified cervical region: Secondary | ICD-10-CM

## 2015-06-06 ENCOUNTER — Telehealth: Payer: Self-pay | Admitting: Endocrinology

## 2015-06-06 ENCOUNTER — Other Ambulatory Visit: Payer: Self-pay | Admitting: *Deleted

## 2015-06-06 MED ORDER — LEVOTHYROXINE SODIUM 88 MCG PO TABS: 88.0000 ug | ORAL_TABLET | Freq: Every day | ORAL | Status: DC

## 2015-06-06 NOTE — Telephone Encounter (Signed)
Pt needs her Synthroid called in to [facility]

## 2015-06-06 NOTE — Telephone Encounter (Signed)
rx sent for 30 day supply, patient needs appointment for further refills.

## 2015-06-08 ENCOUNTER — Ambulatory Visit
Admission: RE | Admit: 2015-06-08 | Discharge: 2015-06-08 | Disposition: A | Discharge status: Home / Self Care | Payer: Medicare Other | Source: Ambulatory Visit | Attending: Neurosurgery | Admitting: Neurosurgery

## 2015-06-08 DIAGNOSIS — M5030 Other cervical disc degeneration, unspecified cervical region: Secondary | ICD-10-CM

## 2015-06-08 DIAGNOSIS — M4322 Fusion of spine, cervical region: Secondary | ICD-10-CM | POA: Diagnosis not present

## 2015-06-13 DIAGNOSIS — M5030 Other cervical disc degeneration, unspecified cervical region: Secondary | ICD-10-CM | POA: Diagnosis not present

## 2015-06-13 DIAGNOSIS — M4722 Other spondylosis with radiculopathy, cervical region: Secondary | ICD-10-CM | POA: Diagnosis not present

## 2015-06-13 DIAGNOSIS — M542 Cervicalgia: Secondary | ICD-10-CM | POA: Diagnosis not present

## 2015-06-20 DIAGNOSIS — R300 Dysuria: Secondary | ICD-10-CM | POA: Diagnosis not present

## 2015-06-20 DIAGNOSIS — N3010 Interstitial cystitis (chronic) without hematuria: Secondary | ICD-10-CM | POA: Diagnosis not present

## 2015-06-29 DIAGNOSIS — N9089 Other specified noninflammatory disorders of vulva and perineum: Secondary | ICD-10-CM | POA: Diagnosis not present

## 2015-06-29 DIAGNOSIS — M8580 Other specified disorders of bone density and structure, unspecified site: Secondary | ICD-10-CM | POA: Diagnosis not present

## 2015-06-29 DIAGNOSIS — Z124 Encounter for screening for malignant neoplasm of cervix: Secondary | ICD-10-CM | POA: Diagnosis not present

## 2015-07-05 ENCOUNTER — Other Ambulatory Visit: Payer: Self-pay | Admitting: Endocrinology

## 2015-07-05 ENCOUNTER — Telehealth: Payer: Self-pay | Admitting: *Deleted

## 2015-07-05 NOTE — Telephone Encounter (Signed)
FYI:  Pharmacy is requesting a refill of Janumet, patient was suppose to come for Follow up in February, her prescriptions have to be for a 90, rx was denied because she hasn't been seen and does not have a follow up scheduled.

## 2015-07-05 NOTE — Telephone Encounter (Signed)
Pt needs janumet called into [facility] [address]

## 2015-07-11 DIAGNOSIS — L292 Pruritus vulvae: Secondary | ICD-10-CM | POA: Diagnosis not present

## 2015-07-11 DIAGNOSIS — N904 Leukoplakia of vulva: Secondary | ICD-10-CM | POA: Diagnosis not present

## 2015-07-12 ENCOUNTER — Telehealth: Payer: Self-pay | Admitting: Endocrinology

## 2015-07-12 NOTE — Telephone Encounter (Signed)
Pt needs janumet called into [facility] on [address] please

## 2015-07-12 NOTE — Telephone Encounter (Signed)
She has not been seen since October, per Dr. Dwyane Dee a refill will not be sent until she's seen in the office.

## 2015-07-13 ENCOUNTER — Other Ambulatory Visit (INDEPENDENT_AMBULATORY_CARE_PROVIDER_SITE_OTHER): Payer: Medicare Other

## 2015-07-13 DIAGNOSIS — E1165 Type 2 diabetes mellitus with hyperglycemia: Secondary | ICD-10-CM

## 2015-07-13 DIAGNOSIS — E119 Type 2 diabetes mellitus without complications: Secondary | ICD-10-CM

## 2015-07-13 LAB — COMPREHENSIVE METABOLIC PANEL
ALT: 14 U/L (ref 0–35)
AST: 19 U/L (ref 0–37)
Albumin: 3.8 g/dL (ref 3.5–5.2)
Alkaline Phosphatase: 56 U/L (ref 39–117)
BUN: 18 mg/dL (ref 6–23)
CO2: 28 mEq/L (ref 19–32)
Calcium: 9.5 mg/dL (ref 8.4–10.5)
Chloride: 102 mEq/L (ref 96–112)
Creatinine, Ser: 1.14 mg/dL (ref 0.40–1.20)
GFR: 49.76 mL/min — ABNORMAL LOW (ref >60.00)
Glucose, Bld: 156 mg/dL — ABNORMAL HIGH (ref 70–99)
Potassium: 4.7 mEq/L (ref 3.5–5.1)
Sodium: 136 mEq/L (ref 135–145)
Total Bilirubin: 0.3 mg/dL (ref 0.2–1.2)
Total Protein: 6.1 g/dL (ref 6.0–8.3)

## 2015-07-13 LAB — HEMOGLOBIN A1C: Hgb A1c MFr Bld: 6.6 % — ABNORMAL HIGH (ref 4.6–6.5)

## 2015-07-13 LAB — TSH: TSH: 0.89 u[IU]/mL (ref 0.35–4.50)

## 2015-07-13 LAB — T4, FREE: Free T4: 1.37 ng/dL (ref 0.60–1.60)

## 2015-07-13 NOTE — Telephone Encounter (Signed)
error 

## 2015-07-17 ENCOUNTER — Encounter: Payer: Self-pay | Admitting: Endocrinology

## 2015-07-17 ENCOUNTER — Other Ambulatory Visit: Payer: Self-pay | Admitting: *Deleted

## 2015-07-17 ENCOUNTER — Ambulatory Visit (INDEPENDENT_AMBULATORY_CARE_PROVIDER_SITE_OTHER): Payer: Medicare Other | Admitting: Endocrinology

## 2015-07-17 VITALS — BP 112/64 | HR 63 | Temp 97.7°F | Resp 12 | Ht 61.0 in | Wt 115.6 lb

## 2015-07-17 DIAGNOSIS — E038 Other specified hypothyroidism: Secondary | ICD-10-CM | POA: Diagnosis not present

## 2015-07-17 DIAGNOSIS — E1165 Type 2 diabetes mellitus with hyperglycemia: Secondary | ICD-10-CM | POA: Diagnosis not present

## 2015-07-17 DIAGNOSIS — E7800 Pure hypercholesterolemia, unspecified: Secondary | ICD-10-CM

## 2015-07-17 LAB — MICROALBUMIN / CREATININE URINE RATIO
Creatinine,U: 31.2 mg/dL
Microalb Creat Ratio: 8 mg/g (ref 0.0–30.0)
Microalb, Ur: 2.5 mg/dL — ABNORMAL HIGH (ref 0.0–1.9)

## 2015-07-17 LAB — POCT URINALYSIS DIPSTICK
Bilirubin, UA: NEGATIVE
Blood, UA: NEGATIVE
Glucose, UA: NEGATIVE
Ketones, UA: NEGATIVE
Nitrite, UA: NEGATIVE
Protein, UA: NEGATIVE
Spec Grav, UA: <=1.005
Urobilinogen, UA: 0.2
pH, UA: 6.5

## 2015-07-17 MED ORDER — LEVOTHYROXINE SODIUM 88 MCG PO TABS: 88.0000 ug | ORAL_TABLET | Freq: Every day | ORAL | Status: DC

## 2015-07-17 MED ORDER — SITAGLIPTIN PHOS-METFORMIN HCL 50-500 MG PO TABS: ORAL_TABLET | ORAL | Status: DC

## 2015-07-17 NOTE — Progress Notes (Signed)
Patient ID: Abigail Hoover, female   DOB: December 21, 1945, 70 y.o.   MRN: [id number]    Reason for Appointment:  follow-up of various issues  History of Present Illness   Diagnosis: Type 2 DIABETES MELITUS, long-standing  She has had mild diabetes for several years but has required multiple drugs for control Usually has had postprandial hyperglycemia, this has been better controlled with adding Glyset to her main meals Has never been on insulin or a GLP-1 drug Actos had been stopped previously because of tendency to edema  Recent history:  She has not been seen in follow-up since 12/2014 Her A1c is consistent in the upper normal range, was higher in the last visit she had some steroid treatments for her back  Current management, problems identified and blood sugar patterns:  She is on a low dose of metformin, 500 mg twice a day because of previous tendency to low sugars with higher doses  Unable to review her monitor as she does not have functioning battery with date and previously had some readings from her husband also on the monitor  She now says that sometimes after dinner her blood sugar may be over 200 but then she or carbohydrates or sweets  She still does not walk much despite reminders  She does not eat much at lunchtime and she is taking one of her Glyset tablets before her afternoon snack which is small although have some carbohydrates with that  She thinks fasting blood sugars are usually good    Oral hypoglycemic drugs:   Glyset 50 mg before breakfast, tea and dinner, Janumet 50/ 500 twice a day  Side effects from medications: None Proper timing of medications in relation to meals: Yes.          Monitors blood glucose: 1-2 times a day.    Glucometer: One Touch       Blood Glucose readings from recall:   Recently about <120 fasting, after meals variable, occasionally over 200 at night  Meals: 2- 3 meals per day.  eating small breakfast and lunch but mostly  with carbohydrates    Physical activity: exercise: No programmed activity, she says she is active with work             Dietician visit: Most recent: Unknown       Complications: are mild symptoms of neuropathy, no nephropathy or retinopathy   Wt Readings from Last 3 Encounters:  07/17/15 115 lb 9.6 oz (52.436 kg)  05/09/15 112 lb 4.8 oz (50.939 kg)  01/16/15 115 lb 6.4 oz (52.345 kg)   Lab Results  Component Value Date   HGBA1C 6.6* 07/13/2015   HGBA1C 6.9* 01/02/2015   HGBA1C 6.3 12/07/2014   Lab Results  Component Value Date   MICROALBUR 1.7 11/05/2012   LDLCALC 52 01/03/2015   CREATININE 1.14 07/13/2015        OTHER problems are in review of systems:      Medication List       This list is accurate as of: 07/17/15  3:32 PM.  Always use your most recent med list.               aspirin 81 MG tablet  Take 81 mg by mouth daily.     DEXILANT 60 MG capsule  Generic drug:  dexlansoprazole  Take 60 mg by mouth daily.     gabapentin 300 MG capsule  Commonly known as:  NEURONTIN  Take 300 mg by mouth 4 (four)  times daily.     ibandronate 150 MG tablet  Commonly known as:  BONIVA  TAKE 1 TAB ONCE A MONTH IN THE AM ON AN EMPTY STOMACH WITH FULL GLAS OF WATER STAY UPRIGHT 30 MIN     isosorbide mononitrate 60 MG 24 hr tablet  Commonly known as:  IMDUR  Take 1 tablet (60 mg total) by mouth daily.     levothyroxine 88 MCG tablet  Commonly known as:  SYNTHROID, LEVOTHROID  Take 1 tablet (88 mcg total) by mouth daily.     losartan 100 MG tablet  Commonly known as:  COZAAR  Take 100 mg by mouth daily.     losartan 50 MG tablet  Commonly known as:  COZAAR  Take 50 mg by mouth daily.     metoprolol succinate 50 MG 24 hr tablet  Commonly known as:  TOPROL-XL  Take 1 tablet (50 mg total) by mouth daily. Take with or immediately following a meal.     miglitol 50 MG tablet  Commonly known as:  GLYSET  ONE TABLET BY MOUTH Three times A DAY     GLYSET 50 MG  tablet  Generic drug:  miglitol  TAKE ONE TABLET BY MOUTH TWICE A DAY     MYRBETRIQ 25 MG Tb24 tablet  Generic drug:  mirabegron ER  Take 1 tablet by mouth Daily.     nitroGLYCERIN 0.4 MG SL tablet  Commonly known as:  NITROSTAT  Place 1 tablet (0.4 mg total) under the tongue every 5 (five) minutes x 3 doses as needed for chest pain.     ONE TOUCH ULTRA 2 w/Device Kit  Use to check blood sugars 1 time per day     pentosan polysulfate 100 MG capsule  Commonly known as:  ELMIRON  Take 100 mg by mouth 3 (three) times daily before meals.     pravastatin 20 MG tablet  Commonly known as:  PRAVACHOL  Take 20 mg by mouth daily.     sitaGLIPtin-metformin 50-500 MG tablet  Commonly known as:  JANUMET  TAKE 1 TABLET BY MOUTH 2 (TWO) TIMES DAILY WITH A MEAL.        Allergies:  Allergies  Allergen Reactions  . Ezetimibe Itching  . Atorvastatin     REACTION: rash    Past Medical History  Diagnosis Date  . Diabetes mellitus   . Hypertension   . Thyroid disease   . Arthritis   . Anemia   . GERD (gastroesophageal reflux disease)   . Hypercholesterolemia   . Bladder irritation   . Iron deficiency anemia, unspecified   . Hypothyroidism   . Anxiety   . Psoriasis   . Deficiency anemia 05/27/2014  . Anemia in chronic kidney disease 05/01/2015    Past Surgical History  Procedure Laterality Date  . Abdominal hysterectomy      TAH  . Cervical disc surgery    . Cataract extraction    . Breast biopsy      Left breast biopsy benign lesion  . Colonoscopy  2013    neg. next 2023.   Marland Kitchen Eye surgery    . Breast biopsy  03/11/2012    Procedure: BREAST BIOPSY;  Surgeon: Odis Hollingshead, MD;  Location: [facility];  Service: General;  Laterality: Left;  remove left breast mass  . Cardiac catheterization N/A 01/02/2015    Procedure: Left Heart Cath and Coronary Angiography;  Surgeon: Charolette Forward, MD;  Location: [facility];  Service: Cardiovascular;  Laterality: N/A;    Family  History  Problem Relation Age of Onset  . Breast cancer Sister   . Cancer Sister     breast cancer  . Heart disease Mother     heart attack  . Stroke Father     brain hemorrhage  . Diabetes Brother   . Cancer Brother     Social History:  reports that she has never smoked. She has never used smokeless tobacco. She reports that she does not drink alcohol or use illicit drugs.  Review of Systems:  HYPERTENSION:  this is well-controlled with losartan along with metoprolol , now taking ER 50 mg daily  RENAL dysfunction: Resolved  Lab Results  Component Value Date   CREATININE 1.14 07/13/2015   BUN 18 07/13/2015   NA 136 07/13/2015   K 4.7 07/13/2015   CL 102 07/13/2015   CO2 28 07/13/2015     HYPERLIPIDEMIA: The lipid abnormality consists of elevated LDL which is mild and well controlled with pravastatin She thinks she had a rash from Lipitor but has not tried any other statin drugs  Lab Results  Component Value Date   CHOL 127 01/03/2015   HDL 63 01/03/2015   LDLCALC 52 01/03/2015   TRIG 60 01/03/2015   CHOLHDL 2.0 01/03/2015    She has a long history of primary hypothyroidism which has been fairly stable and TSH consistently normal with 88 g She takes this on an empty stomach early in the morning  TSH has been stable on current regimen  Lab Results  Component Value Date   TSH 0.89 07/13/2015   TSH 0.97 12/07/2014   TSH 1.090 12/07/2014   FREET4 1.37 07/13/2015   FREET4 1.69* 12/07/2014   FREET4 2.13* 12/07/2014    NEUROPATHY:   No symptoms of numbness, burning or tingling   Diabetic foot exam in 4/17: Normal monofilament sensation but absent pulses   History of iron deficiency anemia; currently not taking iron and is getting  Aranesp    Lab Results  Component Value Date   WBC 6.5 05/03/2015   HGB 10.6* 05/03/2015   HCT 34.2* 05/03/2015   MCV 82.4 05/03/2015   PLT 228 05/03/2015     OSTEOPENIA: Her T score  done by gynecologist was -2.1,  currently on Boniva started in 01/2014     Examination:   BP 112/64 mmHg  Pulse 63  Temp(Src) 97.7 F (36.5 C)  Resp 12  Ht _0  (1.549 m)  Wt 115 lb 9.6 oz (52.436 kg)  BMI 21.85 kg/m2  SpO2 98%  Body mass index is 21.85 kg/(m^2).    Trace ankle edema  Diabetic Foot Exam - Simple   Simple Foot Form  Diabetic Foot exam was performed with the following findings:  Yes 07/17/2015  2:23 PM  Visual Inspection  No deformities, no ulcerations, no other skin breakdown bilaterally:  Yes  Sensation Testing  Intact to touch and monofilament testing bilaterally:  Yes  Pulse Check  See comments:  Yes  Comments  Absent pedal pulses       ASSESSMENT/ PLAN:   Diabetes type 2   The patient's diabetes control is  fairly good with her multidrug regimen although she does have higher readings reportedly after meals if she does not watch her diet Unable to download her meter as it has low battery A1c is slightly higher than usual but stable under 7%  Recommended that she try taking 100 mg of Glyset that her evening meal  when blood sugars tend to be going up at times Also encouraged her to start walking daily She will continue to monitor readings after meals eats at breakfast and supper and bring her monitor for download on the next visit  Otherwise she will continue her Janumet 50/500  HYPERLIPIDEMIA: Lipids to be rechecked on the next visit, taking pravastatin Not clear if this is being followed by cardiologist    Patient Instructions  Glyset 1 at Bfst and 2 at dinner  Check blood sugars on waking up  2 times a week Also check blood sugars about 2 hours after a meal and do this after different meals by rotation  Recommended blood sugar levels on waking up is 90-130 and about 2 hours after meal is 130-160  Please bring your blood sugar monitor to each visit, thank you   Walk daily     Ishi Danser 07/17/2015, 3:32 PM

## 2015-07-17 NOTE — Patient Instructions (Signed)
Glyset 1 at [facility] and 2 at dinner  Check blood sugars on waking up  2 times a week Also check blood sugars about 2 hours after a meal and do this after different meals by rotation  Recommended blood sugar levels on waking up is 90-130 and about 2 hours after meal is 130-160  Please bring your blood sugar monitor to each visit, thank you   Walk daily

## 2015-08-07 ENCOUNTER — Ambulatory Visit: Payer: Medicare Other | Admitting: Hematology and Oncology

## 2015-08-07 ENCOUNTER — Other Ambulatory Visit: Payer: Medicare Other

## 2015-08-07 ENCOUNTER — Telehealth: Payer: Self-pay | Admitting: Hematology and Oncology

## 2015-08-07 NOTE — Telephone Encounter (Signed)
s.w. pt and r/s missed appt.....pt ok and aware °

## 2015-08-10 DIAGNOSIS — I2510 Atherosclerotic heart disease of native coronary artery without angina pectoris: Secondary | ICD-10-CM | POA: Diagnosis not present

## 2015-08-10 DIAGNOSIS — E039 Hypothyroidism, unspecified: Secondary | ICD-10-CM | POA: Diagnosis not present

## 2015-08-10 DIAGNOSIS — E119 Type 2 diabetes mellitus without complications: Secondary | ICD-10-CM | POA: Diagnosis not present

## 2015-08-10 DIAGNOSIS — I252 Old myocardial infarction: Secondary | ICD-10-CM | POA: Diagnosis not present

## 2015-08-10 DIAGNOSIS — D649 Anemia, unspecified: Secondary | ICD-10-CM | POA: Diagnosis not present

## 2015-08-10 DIAGNOSIS — K219 Gastro-esophageal reflux disease without esophagitis: Secondary | ICD-10-CM | POA: Diagnosis not present

## 2015-08-10 DIAGNOSIS — E785 Hyperlipidemia, unspecified: Secondary | ICD-10-CM | POA: Diagnosis not present

## 2015-08-10 DIAGNOSIS — I10 Essential (primary) hypertension: Secondary | ICD-10-CM | POA: Diagnosis not present

## 2015-08-10 DIAGNOSIS — I201 Angina pectoris with documented spasm: Secondary | ICD-10-CM | POA: Diagnosis not present

## 2015-08-10 DIAGNOSIS — E559 Vitamin D deficiency, unspecified: Secondary | ICD-10-CM | POA: Diagnosis not present

## 2015-08-14 ENCOUNTER — Other Ambulatory Visit (HOSPITAL_BASED_OUTPATIENT_CLINIC_OR_DEPARTMENT_OTHER): Payer: Medicare Other

## 2015-08-14 ENCOUNTER — Telehealth: Payer: Self-pay | Admitting: Hematology and Oncology

## 2015-08-14 ENCOUNTER — Ambulatory Visit (HOSPITAL_BASED_OUTPATIENT_CLINIC_OR_DEPARTMENT_OTHER): Payer: Medicare Other | Admitting: Hematology and Oncology

## 2015-08-14 VITALS — BP 156/48 | HR 64 | Temp 97.8°F | Resp 18 | Ht 61.0 in | Wt 114.2 lb

## 2015-08-14 DIAGNOSIS — I10 Essential (primary) hypertension: Secondary | ICD-10-CM

## 2015-08-14 DIAGNOSIS — N189 Chronic kidney disease, unspecified: Secondary | ICD-10-CM | POA: Diagnosis not present

## 2015-08-14 DIAGNOSIS — D631 Anemia in chronic kidney disease: Secondary | ICD-10-CM | POA: Diagnosis not present

## 2015-08-14 DIAGNOSIS — D638 Anemia in other chronic diseases classified elsewhere: Secondary | ICD-10-CM

## 2015-08-14 DIAGNOSIS — D539 Nutritional anemia, unspecified: Secondary | ICD-10-CM

## 2015-08-14 DIAGNOSIS — N183 Chronic kidney disease, stage 3 unspecified: Secondary | ICD-10-CM

## 2015-08-14 LAB — CBC WITH DIFFERENTIAL/PLATELET
BASO%: 0.4 % (ref 0.0–2.0)
Basophils Absolute: 0 10*3/uL (ref 0.0–0.1)
EOS%: 6.5 % (ref 0.0–7.0)
Eosinophils Absolute: 0.3 10*3/uL (ref 0.0–0.5)
HCT: 32.2 % — ABNORMAL LOW (ref 34.8–46.6)
HGB: 10 g/dL — ABNORMAL LOW (ref 11.6–15.9)
LYMPH%: 28.1 % (ref 14.0–49.7)
MCH: 25.5 pg (ref 25.1–34.0)
MCHC: 31 g/dL — ABNORMAL LOW (ref 31.5–36.0)
MCV: 82.2 fL (ref 79.5–101.0)
MONO#: 0.3 10*3/uL (ref 0.1–0.9)
MONO%: 5.4 % (ref 0.0–14.0)
NEUT#: 3.2 10*3/uL (ref 1.5–6.5)
NEUT%: 59.6 % (ref 38.4–76.8)
Platelets: 228 10*3/uL (ref 145–400)
RBC: 3.92 10*6/uL (ref 3.70–5.45)
RDW: 13.5 % (ref 11.2–14.5)
WBC: 5.4 10*3/uL (ref 3.9–10.3)
lymph#: 1.5 10*3/uL (ref 0.9–3.3)

## 2015-08-14 LAB — BASIC METABOLIC PANEL
Anion Gap: 7 mEq/L (ref 3–11)
BUN: 18.3 mg/dL (ref 7.0–26.0)
CO2: 26 mEq/L (ref 22–29)
Calcium: 9.4 mg/dL (ref 8.4–10.4)
Chloride: 105 mEq/L (ref 98–109)
Creatinine: 1.1 mg/dL (ref 0.6–1.1)
EGFR: 49 mL/min/{1.73_m2} — ABNORMAL LOW (ref >90)
Glucose: 157 mg/dl — ABNORMAL HIGH (ref 70–140)
Potassium: 5.1 mEq/L (ref 3.5–5.1)
Sodium: 138 mEq/L (ref 136–145)

## 2015-08-14 LAB — IRON AND TIBC
%SAT: 20 % — ABNORMAL LOW (ref 21–57)
Iron: 57 ug/dL (ref 41–142)
TIBC: 276 ug/dL (ref 236–444)
UIBC: 220 ug/dL (ref 120–384)

## 2015-08-14 LAB — FERRITIN: Ferritin: 68 ng/ml (ref 9–269)

## 2015-08-14 NOTE — Progress Notes (Signed)
Abigail Hoover  Abigail Hams, MD SUMMARY OF HEMATOLOGIC HISTORY:  This is a patient that has been followed here 4 years for chronic anemia. In 2011, she had a bone marrow aspirate and biopsy that came back normal. The patient has been receiving Aranesp injection for chronic anemia up until 2014. It was subsequently discontinued due stability of the anemia. Starting 05/27/2014, darbepoetin was resumed at 200 g every other month to keep hemoglobin greater than 10 g. Treatment was discontinued in October 2016 INTERVAL HISTORY: Abigail Hoover 70 y.o. female returns for further follow-up. She feels well. Has mild fatigue. The patient denies any recent signs or symptoms of bleeding such as spontaneous epistaxis, hematuria or hematochezia.  I have reviewed the past medical history, past surgical history, social history and family history with the patient and they are unchanged from previous Hoover.  ALLERGIES:  is allergic to ezetimibe and atorvastatin.  MEDICATIONS:  Current Outpatient Prescriptions  Medication Sig Dispense Refill  . aspirin 81 MG tablet Take 81 mg by mouth daily.      . Blood Glucose Monitoring Suppl (ONE TOUCH ULTRA 2) W/DEVICE KIT Use to check blood sugars 1 time per day 1 each 0  . dexlansoprazole (DEXILANT) 60 MG capsule Take 60 mg by mouth daily.      Marland Kitchen gabapentin (NEURONTIN) 300 MG capsule Take 300 mg by mouth 4 (four) times daily.    Marland Kitchen GLYSET 50 MG tablet TAKE ONE TABLET BY MOUTH TWICE A DAY 180 tablet 1  . ibandronate (BONIVA) 150 MG tablet TAKE 1 TAB ONCE A MONTH IN THE AM ON AN EMPTY STOMACH WITH FULL GLAS OF WATER STAY UPRIGHT 30 MIN 1 tablet 5  . isosorbide mononitrate (IMDUR) 60 MG 24 hr tablet Take 1 tablet (60 mg total) by mouth daily. 30 tablet 3  . levothyroxine (SYNTHROID, LEVOTHROID) 88 MCG tablet Take 1 tablet (88 mcg total) by mouth daily. 90 tablet 1  . losartan (COZAAR) 100 MG tablet Take 100 mg by mouth daily.    .  metoprolol succinate (TOPROL-XL) 50 MG 24 hr tablet Take 1 tablet (50 mg total) by mouth daily. Take with or immediately following a meal. 30 tablet 3  . miglitol (GLYSET) 50 MG tablet ONE TABLET BY MOUTH Three times A DAY 270 tablet 1  . MYRBETRIQ 25 MG TB24 Take 1 tablet by mouth Daily.    . nitroGLYCERIN (NITROSTAT) 0.4 MG SL tablet Place 1 tablet (0.4 mg total) under the tongue every 5 (five) minutes x 3 doses as needed for chest pain. 25 tablet 12  . pentosan polysulfate (ELMIRON) 100 MG capsule Take 100 mg by mouth 3 (three) times daily before meals.     . pravastatin (PRAVACHOL) 20 MG tablet Take 20 mg by mouth daily.    . sitaGLIPtin-metformin (JANUMET) 50-500 MG tablet TAKE 1 TABLET BY MOUTH 2 (TWO) TIMES DAILY WITH A MEAL. 180 tablet 1   No current facility-administered medications for this visit.     REVIEW OF SYSTEMS:   Constitutional: Denies fevers, chills or night sweats Eyes: Denies blurriness of vision Ears, nose, mouth, throat, and face: Denies mucositis or sore throat Respiratory: Denies cough, dyspnea or wheezes Cardiovascular: Denies palpitation, chest discomfort or lower extremity swelling Gastrointestinal:  Denies nausea, heartburn or change in bowel habits Skin: Denies abnormal skin rashes Lymphatics: Denies new lymphadenopathy or easy bruising Neurological:Denies numbness, tingling or new weaknesses Behavioral/Psych: Mood is stable, no new changes  All other systems were  reviewed with the patient and are negative.  PHYSICAL EXAMINATION: ECOG PERFORMANCE STATUS: 0 - Asymptomatic  Filed Vitals:   08/14/15 1309  BP: 156/48  Pulse: 64  Temp: 97.8 F (36.6 C)  Resp: 18   Filed Weights   08/14/15 1309  Weight: 114 lb 3.2 oz (51.801 kg)    GENERAL:alert, no distress and comfortable SKIN: skin color, texture, turgor are normal, no rashes or significant lesions EYES: normal, Conjunctiva are pink and non-injected, sclera clear OROPHARYNX:no exudate, no  erythema and lips, buccal mucosa, and tongue normal  NECK: supple, thyroid normal size, non-tender, without nodularity LYMPH:  no palpable lymphadenopathy in the cervical, axillary or inguinal LUNGS: clear to auscultation and percussion with normal breathing effort HEART: regular rate & rhythm and no murmurs and no lower extremity edema ABDOMEN:abdomen soft, non-tender and normal bowel sounds Musculoskeletal:no cyanosis of digits and no clubbing  NEURO: alert & oriented x 3 with fluent speech, no focal motor/sensory deficits  LABORATORY DATA:  I have reviewed the data as listed     Component Value Date/Time   NA 138 08/14/2015 1258   NA 136 07/13/2015 1356   K 5.1 08/14/2015 1258   K 4.7 07/13/2015 1356   CL 102 07/13/2015 1356   CL 103 07/27/2012 1259   CO2 26 08/14/2015 1258   CO2 28 07/13/2015 1356   GLUCOSE 157* 08/14/2015 1258   GLUCOSE 156* 07/13/2015 1356   GLUCOSE 118* 07/27/2012 1259   BUN 18.3 08/14/2015 1258   BUN 18 07/13/2015 1356   CREATININE 1.1 08/14/2015 1258   CREATININE 1.14 07/13/2015 1356   CALCIUM 9.4 08/14/2015 1258   CALCIUM 9.5 07/13/2015 1356   PROT 6.1 07/13/2015 1356   PROT 6.3* 02/10/2014 1136   PROT 6.0 04/26/2013 1040   ALBUMIN 3.8 07/13/2015 1356   ALBUMIN 3.9 02/10/2014 1136   AST 19 07/13/2015 1356   AST 15 02/10/2014 1136   ALT 14 07/13/2015 1356   ALT 9 02/10/2014 1136   ALKPHOS 56 07/13/2015 1356   ALKPHOS 56 02/10/2014 1136   BILITOT 0.3 07/13/2015 1356   BILITOT 0.35 02/10/2014 1136   GFRNONAA 56* 01/04/2015 0430   GFRAA >60 01/04/2015 0430    No results found for: SPEP, UPEP  Lab Results  Component Value Date   WBC 5.4 08/14/2015   NEUTROABS 3.2 08/14/2015   HGB 10.0* 08/14/2015   HCT 32.2* 08/14/2015   MCV 82.2 08/14/2015   PLT 228 08/14/2015      Chemistry      Component Value Date/Time   NA 138 08/14/2015 1258   NA 136 07/13/2015 1356   K 5.1 08/14/2015 1258   K 4.7 07/13/2015 1356   CL 102 07/13/2015 1356    CL 103 07/27/2012 1259   CO2 26 08/14/2015 1258   CO2 28 07/13/2015 1356   BUN 18.3 08/14/2015 1258   BUN 18 07/13/2015 1356   CREATININE 1.1 08/14/2015 1258   CREATININE 1.14 07/13/2015 1356      Component Value Date/Time   CALCIUM 9.4 08/14/2015 1258   CALCIUM 9.5 07/13/2015 1356   ALKPHOS 56 07/13/2015 1356   ALKPHOS 56 02/10/2014 1136   AST 19 07/13/2015 1356   AST 15 02/10/2014 1136   ALT 14 07/13/2015 1356   ALT 9 02/10/2014 1136   BILITOT 0.3 07/13/2015 1356   BILITOT 0.35 02/10/2014 1136      ASSESSMENT & PLAN:  Anemia, chronic disease This is likely anemia of chronic disease. The patient denies recent  history of bleeding such as epistaxis, hematuria or hematochezia.  She had responded well to darbepoetin in the past. Since we discontinue treatment for the last 6 months, her hemoglobin is stable. I reviewed the current Medicare guidelines with the patient and told her that we cannot restart Aranesp until her hemoglobin drifted under 10 g. I will see her back in 6 months for further blood work and examination    Essential hypertension Her blood pressure is a little high today. she will continue current medical management. I recommend close follow-up with primary care doctor for medication adjustment.    Chronic kidney disease, stage III (moderate) This is likely related to aging, chronic type 2 diabetes and hypertension. Continue risk factor modification.   All questions were answered. The patient knows to call the clinic with any problems, questions or concerns. No barriers to learning was detected.  I spent 15 minutes counseling the patient face to face. The total time spent in the appointment was 20 minutes and more than 50% was on counseling.     Karelyn Brisby, MD 5/22/20171:45 PM

## 2015-08-14 NOTE — Assessment & Plan Note (Signed)
Her blood pressure is a little high today. she will continue current medical management. I recommend close follow-up with primary care doctor for medication adjustment.   

## 2015-08-14 NOTE — Assessment & Plan Note (Signed)
This is likely related to aging, chronic type 2 diabetes and hypertension. Continue risk factor modification. 

## 2015-08-14 NOTE — Assessment & Plan Note (Signed)
This is likely anemia of chronic disease. The patient denies recent history of bleeding such as epistaxis, hematuria or hematochezia.  She had responded well to darbepoetin in the past. Since we discontinue treatment for the last 6 months, her hemoglobin is stable. I reviewed the current Medicare guidelines with the patient and told her that we cannot restart Aranesp until her hemoglobin drifted under 10 g. I will see her back in 6 months for further blood work and examination

## 2015-08-14 NOTE — Telephone Encounter (Signed)
Gave and printed appt sched and avs fo rpt; for NOV  °

## 2015-08-15 LAB — ERYTHROPOIETIN: Erythropoietin: 15.2 m[IU]/mL (ref 2.6–18.5)

## 2015-08-15 LAB — VITAMIN B12: Vitamin B12: 310 pg/mL (ref 211–946)

## 2015-08-18 DIAGNOSIS — R21 Rash and other nonspecific skin eruption: Secondary | ICD-10-CM | POA: Diagnosis not present

## 2015-08-28 DIAGNOSIS — E785 Hyperlipidemia, unspecified: Secondary | ICD-10-CM | POA: Diagnosis not present

## 2015-08-28 DIAGNOSIS — I252 Old myocardial infarction: Secondary | ICD-10-CM | POA: Diagnosis not present

## 2015-08-28 DIAGNOSIS — I201 Angina pectoris with documented spasm: Secondary | ICD-10-CM | POA: Diagnosis not present

## 2015-08-28 DIAGNOSIS — D649 Anemia, unspecified: Secondary | ICD-10-CM | POA: Diagnosis not present

## 2015-08-28 DIAGNOSIS — I2510 Atherosclerotic heart disease of native coronary artery without angina pectoris: Secondary | ICD-10-CM | POA: Diagnosis not present

## 2015-08-28 DIAGNOSIS — E119 Type 2 diabetes mellitus without complications: Secondary | ICD-10-CM | POA: Diagnosis not present

## 2015-08-28 DIAGNOSIS — E039 Hypothyroidism, unspecified: Secondary | ICD-10-CM | POA: Diagnosis not present

## 2015-08-28 DIAGNOSIS — E559 Vitamin D deficiency, unspecified: Secondary | ICD-10-CM | POA: Diagnosis not present

## 2015-08-28 DIAGNOSIS — K219 Gastro-esophageal reflux disease without esophagitis: Secondary | ICD-10-CM | POA: Diagnosis not present

## 2015-08-28 DIAGNOSIS — I10 Essential (primary) hypertension: Secondary | ICD-10-CM | POA: Diagnosis not present

## 2015-09-17 ENCOUNTER — Other Ambulatory Visit: Payer: Self-pay | Admitting: Endocrinology

## 2015-09-25 ENCOUNTER — Other Ambulatory Visit: Payer: Self-pay | Admitting: General Surgery

## 2015-09-25 DIAGNOSIS — Z1231 Encounter for screening mammogram for malignant neoplasm of breast: Secondary | ICD-10-CM

## 2015-10-03 DIAGNOSIS — M170 Bilateral primary osteoarthritis of knee: Secondary | ICD-10-CM | POA: Diagnosis not present

## 2015-10-25 DIAGNOSIS — M25532 Pain in left wrist: Secondary | ICD-10-CM | POA: Diagnosis not present

## 2015-10-25 DIAGNOSIS — M25531 Pain in right wrist: Secondary | ICD-10-CM | POA: Diagnosis not present

## 2015-11-01 ENCOUNTER — Ambulatory Visit
Admission: RE | Admit: 2015-11-01 | Discharge: 2015-11-01 | Disposition: A | Discharge status: Home / Self Care | Payer: Medicare Other | Source: Ambulatory Visit | Attending: General Surgery | Admitting: General Surgery

## 2015-11-01 DIAGNOSIS — Z1231 Encounter for screening mammogram for malignant neoplasm of breast: Secondary | ICD-10-CM | POA: Diagnosis not present

## 2015-11-22 ENCOUNTER — Telehealth: Payer: Self-pay | Admitting: Neurology

## 2015-11-22 ENCOUNTER — Ambulatory Visit (INDEPENDENT_AMBULATORY_CARE_PROVIDER_SITE_OTHER): Payer: Medicare Other | Admitting: Neurology

## 2015-11-22 ENCOUNTER — Telehealth: Payer: Self-pay | Admitting: *Deleted

## 2015-11-22 ENCOUNTER — Encounter: Payer: Self-pay | Admitting: Neurology

## 2015-11-22 VITALS — BP 124/66 | HR 74 | Ht 61.0 in | Wt 112.6 lb

## 2015-11-22 DIAGNOSIS — I6529 Occlusion and stenosis of unspecified carotid artery: Secondary | ICD-10-CM | POA: Diagnosis not present

## 2015-11-22 DIAGNOSIS — G5603 Carpal tunnel syndrome, bilateral upper limbs: Secondary | ICD-10-CM

## 2015-11-22 NOTE — Telephone Encounter (Signed)
Patient is ready to be scheduled for Doppler No PA needed with her insurance. Thanks Hinton Dyer.

## 2015-11-22 NOTE — Patient Instructions (Signed)
I had a long discussion with the patient regarding her bilateral carpal tunnel syndrome and strongly encouraged her to wear wrist extension splints at all times. I also encouraged her to consider surgery for carpal tunnel since she likes to work a lot with her hands and finds wearing splints uncomfortable. She states she will think about this and get back to me I also advised her to take an extra dose of gabapentin in the middle of the night when she wakes up at pain and paresthesias in her feet. Check screening carotid ultrasound study. Return for follow-up in 6 months or call earlier if necessary.   Carpal Tunnel Release Carpal tunnel release is a surgical procedure to relieve numbness and pain in your hand that are caused by carpal tunnel syndrome. Your carpal tunnel is a narrow, hollow space in your wrist. It passes between your wrist bones and a band of connective tissue (transverse carpal ligament). The nerve that supplies most of your hand (median nerve) passes through this space, and so do the connections between your fingers and the muscles of your arm (tendons). Carpal tunnel syndrome makes this space swell and become narrow, and this causes pain and numbness. In carpal tunnel release surgery, a surgeon cuts through the transverse carpal ligament to make more room in the carpal tunnel space. You may have this surgery if other types of treatment have not worked. LET [facility] CARE PROVIDER KNOW ABOUT:  Any allergies you have.  All medicines you are taking, including vitamins, herbs, eye drops, creams, and over-the-counter medicines.  Previous problems you or members of your family have had with the use of anesthetics.  Any blood disorders you have.  Previous surgeries you have had.  Medical conditions you have. RISKS AND COMPLICATIONS Generally, this is a safe procedure. However, problems may occur, including:  Bleeding.  Infection.  Injury to the median nerve.  Need for  additional surgery. BEFORE THE PROCEDURE  Ask your health care provider about:  Changing or stopping your regular medicines. This is especially important if you are taking diabetes medicines or blood thinners.  Taking medicines such as aspirin and ibuprofen. These medicines can thin your blood. Do not take these medicines before your procedure if your health care provider instructs you not to.  Do not eat or drink anything after midnight on the night before the procedure or as directed by your health care provider.  Plan to have someone take you home after the procedure. PROCEDURE  An IV tube may be inserted into a vein.  You will be given one of the following:  A medicine that numbs the wrist area (local anesthetic). You may also be given a medicine to make you relax (sedative).  A medicine that makes you go to sleep (general anesthetic).  Your arm, hand, and wrist will be cleaned with a germ-killing solution (antiseptic).  Your surgeon will make a surgical cut (incision) over the palm side of your wrist. The surgeon will pull aside the skin of your wrist to expose the carpal tunnel space.  The surgeon will cut the transverse carpal ligament.  The edges of the incision will be closed with stitches (sutures) or staples.  A bandage (dressing) will be placed over your wrist and wrapped around your hand and wrist. AFTER THE PROCEDURE  You may spend some time in a recovery area.  Your blood pressure, heart rate, breathing rate, and blood oxygen level will be monitored often until the medicines you were given have  worn off.  You will likely have some pain. You will be given pain medicine.  You may need to wear a splint or a wrist brace over your dressing.   This information is not intended to replace advice given to you by your health care provider. Make sure you discuss any questions you have with your health care provider.   Document Released: 06/01/2003 Document Revised:  04/01/2014 Document Reviewed: 10/27/2013 Elsevier Interactive Patient Education [employer].

## 2015-11-22 NOTE — Progress Notes (Signed)
[facility] [address] [place]. [place] [address] [phone]       OFFICE FOLLOW UP VISIT NOTE  Ms. [name] Date of Birth:  1945-12-11 Medical Record Number:  [id number]   Referring MD:  Mickel Duhamel  Reason for Referral:  Numbness hans and feet  HPI: 51 year [place] lady three-month history of tingling, numbness and burning in her hands and feet. This involves mainly the bottom of her feet and fingertips and hand it is present off and on throughout the day. At times her fingers heart cramped. She has some trouble with fine motor skills by cutting vegetables and occasionally drops objects. She denies any significant problem with balance or walking. She denies significant weakness in the legs. She has tried Lyrica in the past which did not help and stopped it. She is currently on gabapentin 300 mg three times daily which actually she has been taking for 5 years. She has history of diabetes but states his sugars are well controlled and lasti HbA1c 6.7 1 month ago. She also complains of mild short-term memory difficulties which his hand for last several years. 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  She has trouble remembering recent information   but at times but she can remember later.. she is still quite active and independent in activities of daily living and does not need any help. She has history of degenerative cervical spine disease and underwent surgery by Dr. Sherwood Gambler. She did have some paresthesias in her hand prior to the surgery which have been persistent. She denies significant neck pain, radicular pain or trouble with gait or balance . UPDATE 12/09/2013 : She returns for followup of her initial consultation 7 months ago. She is tolerating increased does of bowel content but still has some paresthesias but these are not as bothersome. She continues to complain of discomfort in her hands and in her fingers after using her hands a lot. She admits to doing a lot of  activities with rapid repetitive wrist flexion by cocaine. She did have conduction EMG study done on 04/25/13 by Dr. 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 Peripheral neuropathy panel labs were all normal. I had asked the patient to wear bilateral wrist extension splints but she has not been doing so. She states her diabetes control has been quite good. She has had no new neurological complaints. Cozaar has been discontinued by her primary physician because of low blood pressure Update 03/31/2014 ; She returns for follow-up after last visit 4 months ago. She continues to have mild paresthesias in her hand as well as burning in her feet. She has not been wearing wrist extension splints as instructed. She is also not cut back on activities using her hands for rapid repetitive flexion movements. She continues to have mild gait difficulties but feels her balance is okay and she has not had any falls. She is tolerating gabapentin 300 mg 4 times daily quite well without side effects. She states her diabetes control is quite good and last hemoglobin A1c was 6.0. She has no new complaints today. Update 11/22/2015 : She returns for follow-up after last visit 1.5 years ago. She continues to have significant pain and paresthesias in her hand from carpal tunnel. She states that she has not been wearing wrist extension splints regularly for long periods of time as it interferes with her ability to use her hands to work. She also complains of cramps  and pain in her feet particularly at night. She is currently taking gabapentin 303 times daily which is tolerating well. The patient's husband recently was found to have carotid stenosis and patient is questioning whether she needs a carotid Doppler ultrasound. She states her diabetes control is pretty good. She appears reluctant to consider surgical options but is willing to talk about it ROS:     14 system review of systems is positive for , numbness, burning, and  Tingling,memory loss , joint and back pain, muscle cramps and  all the systems negative  PMH:  Past Medical History:  Diagnosis Date  . Anemia   . 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 Anxiety   . Arthritis   . Bladder irritation   . Deficiency anemia 05/27/2014  . Diabetes mellitus   . GERD (gastroesophageal reflux disease)   . Hypercholesterolemia   . Hypertension   . Hypothyroidism   . Iron deficiency anemia, unspecified   . Psoriasis   . Thyroid disease     Social History:  Social History   Social History  . Marital status: Married    Spouse name: N/A  . Number of children: 1  . Years of education: college   Occupational History  . Homemaker    Social History Main Topics  . Smoking status: Never Smoker  . Smokeless tobacco: Never Used  . Alcohol use No  . Drug use: No  . Sexual activity: Not on file   Other Topics Concern  . Not on file   Social History Narrative   Patient is married with one child.   Patient is right handed.   Patient has college education.   Patient drinks 1 cup daily.    Medications:   Current Outpatient Prescriptions on File Prior to Visit  Medication Sig Dispense Refill  . aspirin 81 MG tablet Take 81 mg by mouth daily.      . Blood Glucose Monitoring Suppl (ONE TOUCH ULTRA 2) W/DEVICE KIT Use to check blood sugars 1 time per day 1 each 0  . dexlansoprazole (DEXILANT) 60 MG capsule Take 60 mg by mouth daily.      Marland Kitchen gabapentin (NEURONTIN) 300 MG capsule Take 300 mg by mouth 4 (four) times daily.    Marland Kitchen GLYSET 50 MG tablet TAKE ONE TABLET BY MOUTH TWICE A DAY 180 tablet 1  . ibandronate (BONIVA) 150 MG tablet TAKE 1 TAB ONCE A MONTH IN THE AM ON AN EMPTY STOMACH WITH FULL GLAS OF WATER STAY UPRIGHT 30 MIN 1 tablet 5  . isosorbide mononitrate (IMDUR) 60 MG 24 hr tablet Take 1 tablet (60 mg total) by mouth daily. 30 tablet 3  . levothyroxine (SYNTHROID,  LEVOTHROID) 88 MCG tablet Take 1 tablet (88 mcg total) by mouth daily. 90 tablet 1  . metoprolol succinate (TOPROL-XL) 50 MG 24 hr tablet Take 1 tablet (50 mg total) by mouth daily. Take with or immediately following a meal. (Patient taking differently: Take 25 mg by mouth daily. Take with or immediately following a meal.) 30 tablet 3  . MYRBETRIQ 25 MG TB24 Take 1 tablet by mouth Daily.    . nitroGLYCERIN (NITROSTAT) 0.4 MG SL tablet Place 1 tablet (0.4 mg total) under the tongue every 5 (five) minutes x 3 doses as needed for chest pain. 25 tablet 12  . pentosan polysulfate (ELMIRON) 100 MG capsule Take 100 mg by mouth 3 (three) times daily before meals.     . pravastatin (PRAVACHOL) 20 MG  tablet Take 20 mg by mouth daily.    . sitaGLIPtin-metformin (JANUMET) 50-500 MG tablet TAKE 1 TABLET BY MOUTH 2 (TWO) TIMES DAILY WITH A MEAL. 180 tablet 1   No current facility-administered medications on file prior to visit.     Allergies:   Allergies  Allergen Reactions  . Ezetimibe Itching  . Atorvastatin     REACTION: rash    Physical Exam General: frail elderly [place] Asian lady, seated, in no evident distress Head: head normocephalic and atraumatic.   Neck: supple with no carotid or supraclavicular bruits Cardiovascular: regular rate and rhythm, no murmurs Musculoskeletal: no deformity. Tinel sign negative over both wrists Skin:  no rash/petichiae Vascular:  Normal pulses all extremities Vitals:   11/22/15 1208  BP: 124/66  Pulse: 74    Neurologic Exam Mental Status: Awake and fully alert. Oriented to place and time. Recent and remote memory intact. Attention span, concentration and fund of knowledge appropriate. Mood and affect appropriate.  Cranial Nerves: Fundoscopic exam not done   Pupils equal, briskly reactive to light. Extraocular movements full without nystagmus. Visual fields full to confrontation. Hearing intact. Facial sensation intact. Face, tongue, palate moves normally and  symmetrically.  Motor: Normal bulk and tone. Normal strength in all tested extremity muscles. Sensory.: intact to touch and pinprick sensation but impaired position and  vibratory. Sensation from ankle down bilaterally. Tinel sign negative over both wrists. Coordination: Rapid alternating movements normal in all extremities. Finger-to-nose and heel-to-shin performed accurately bilaterally. Gait and Station: Arises from chair without difficulty. Stance is normal. Gait demonstrates normal stride length and balance . Not able to heel, toe and tandem walk  Reflexes: 1+ and symmetric except ankle jerks absent and right supinator and triceps jerks brisk 2 + bilaterally. Toes downgoing.       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   PLAN:  I had a long discussion with the patient regarding her bilateral carpal tunnel syndrome and strongly encouraged her to wear wrist extension splints at all times. I also encouraged her to consider surgery for carpal tunnel since she likes to work a lot with her hands and finds wearing splints uncomfortable. She states she will think about this and get back to me I also advised her to take an extra dose of gabapentin in the middle of the night when she wakes up at pain and paresthesias in her feet. Check screening carotid ultrasound study. Greater than 50% time during this 25 minute visit was spent on counseling and coordination of care about.carpal tunnel syndrome treatment. Return for follow-up in 6 months or call earlier if necessary. Antony Contras, MD  [facility] [address] [place], [place] [address]  Phone [phone] Fax [phone] Note: This document was prepared with digital dictation and possible smart phrase technology. Any transcriptional errors that result from this  process are unintentional.

## 2015-11-22 NOTE — Telephone Encounter (Signed)
Pt called to ask if she can see Dr. Alvy Bimler sooner than scheduled in November?  She c/o feeling very weak and tired for one week.  Informed pt next available appt is end of Sept and pt asks if Dr. Alvy Bimler can see her any sooner than that?

## 2015-11-23 ENCOUNTER — Other Ambulatory Visit: Payer: Self-pay | Admitting: Hematology and Oncology

## 2015-11-23 ENCOUNTER — Telehealth: Payer: Self-pay | Admitting: *Deleted

## 2015-11-23 DIAGNOSIS — N183 Chronic kidney disease, stage 3 unspecified: Secondary | ICD-10-CM

## 2015-11-23 DIAGNOSIS — M1711 Unilateral primary osteoarthritis, right knee: Secondary | ICD-10-CM | POA: Diagnosis not present

## 2015-11-23 DIAGNOSIS — D539 Nutritional anemia, unspecified: Secondary | ICD-10-CM

## 2015-11-23 DIAGNOSIS — M1712 Unilateral primary osteoarthritis, left knee: Secondary | ICD-10-CM | POA: Diagnosis not present

## 2015-11-23 DIAGNOSIS — D638 Anemia in other chronic diseases classified elsewhere: Secondary | ICD-10-CM

## 2015-11-23 NOTE — Telephone Encounter (Signed)
Informed pt that Dr. Alvy Bimler instructs her to see her PCP first for her symptoms of weakness and feeling tired.  Pt says she thinks her blood is low and wants to see Dr. Alvy Bimler first. Explained to pt her PCP can check her blood and probably see her sooner.  Also informed her blood results have been stable lately.  Dr. Calton Dach next available appointment is not until the end of September.   Pt says she would rather wait and see Dr. Alvy Bimler at the end of September.  Pt asks if Dr. Alvy Bimler will please see her first?

## 2015-11-23 NOTE — Telephone Encounter (Signed)
I recommend PCP evaluation first and she can ask her PCP to fax [place] copies of records

## 2015-11-23 NOTE — Telephone Encounter (Signed)
I will place order & scheduling for 9/19

## 2015-11-26 ENCOUNTER — Telehealth: Payer: Self-pay | Admitting: Hematology and Oncology

## 2015-11-26 NOTE — Telephone Encounter (Signed)
S/w pt, gav appt for 9/19 @ 2.15pm.

## 2015-11-29 ENCOUNTER — Ambulatory Visit (INDEPENDENT_AMBULATORY_CARE_PROVIDER_SITE_OTHER): Payer: Medicare Other

## 2015-11-29 DIAGNOSIS — I6529 Occlusion and stenosis of unspecified carotid artery: Secondary | ICD-10-CM

## 2015-11-30 ENCOUNTER — Telehealth: Payer: Self-pay | Admitting: Endocrinology

## 2015-12-06 ENCOUNTER — Telehealth: Payer: Self-pay | Admitting: Neurology

## 2015-12-06 NOTE — Telephone Encounter (Signed)
Kindly inform the patient that carotid ultrasound study done on 9/6 /17 shows no major blockages  . Mild age-appropriate hardening of the arteries. No worrisome finding.

## 2015-12-06 NOTE — Telephone Encounter (Signed)
Patient is calling to get doppler results. If no answer please leave a message.

## 2015-12-07 DIAGNOSIS — M1712 Unilateral primary osteoarthritis, left knee: Secondary | ICD-10-CM | POA: Diagnosis not present

## 2015-12-07 DIAGNOSIS — M1711 Unilateral primary osteoarthritis, right knee: Secondary | ICD-10-CM | POA: Diagnosis not present

## 2015-12-07 NOTE — Telephone Encounter (Signed)
No vm was left on patients vm by Dr. Leonie Man nurse.

## 2015-12-07 NOTE — Telephone Encounter (Signed)
Rn was calling patient about carotid ultrasound. Pts husband stated she was getting a MRI and to call back.

## 2015-12-07 NOTE — Telephone Encounter (Signed)
Pt called after receiving vm from the office. She states she does not understand what was being said and would like to have an interpreter for gurati or hindi to call back with her results. She also mentioned Dr. Leonie Man would be able to call and speak with her , that he will speak in language she understands.

## 2015-12-07 NOTE — Telephone Encounter (Signed)
Rn call patients husband to explain that nurse did not leave vm on either of her numbers. Rn explain that Abigail Hoover does not leave results on home number. Rn explain to pts husband that it was not our office. Husband stated okay he will let wife know. Wants a call from [place].

## 2015-12-08 NOTE — Telephone Encounter (Signed)
I called patient and gave doppler results and answered her questions. She voiced understanding

## 2015-12-10 ENCOUNTER — Other Ambulatory Visit: Payer: Self-pay | Admitting: Endocrinology

## 2015-12-11 ENCOUNTER — Other Ambulatory Visit: Payer: Self-pay | Admitting: Endocrinology

## 2015-12-12 ENCOUNTER — Ambulatory Visit (HOSPITAL_BASED_OUTPATIENT_CLINIC_OR_DEPARTMENT_OTHER): Payer: Medicare Other | Admitting: Hematology and Oncology

## 2015-12-12 ENCOUNTER — Encounter: Payer: Self-pay | Admitting: Hematology and Oncology

## 2015-12-12 ENCOUNTER — Other Ambulatory Visit (HOSPITAL_BASED_OUTPATIENT_CLINIC_OR_DEPARTMENT_OTHER): Payer: Medicare Other

## 2015-12-12 ENCOUNTER — Telehealth: Payer: Self-pay | Admitting: Hematology and Oncology

## 2015-12-12 DIAGNOSIS — N183 Chronic kidney disease, stage 3 unspecified: Secondary | ICD-10-CM

## 2015-12-12 DIAGNOSIS — D631 Anemia in chronic kidney disease: Secondary | ICD-10-CM | POA: Diagnosis not present

## 2015-12-12 DIAGNOSIS — D638 Anemia in other chronic diseases classified elsewhere: Secondary | ICD-10-CM

## 2015-12-12 DIAGNOSIS — N189 Chronic kidney disease, unspecified: Secondary | ICD-10-CM

## 2015-12-12 DIAGNOSIS — D539 Nutritional anemia, unspecified: Secondary | ICD-10-CM | POA: Diagnosis not present

## 2015-12-12 DIAGNOSIS — Z803 Family history of malignant neoplasm of breast: Secondary | ICD-10-CM

## 2015-12-12 LAB — CBC & DIFF AND RETIC
BASO%: 0.2 % (ref 0.0–2.0)
Basophils Absolute: 0 10*3/uL (ref 0.0–0.1)
EOS%: 5.4 % (ref 0.0–7.0)
Eosinophils Absolute: 0.4 10*3/uL (ref 0.0–0.5)
HCT: 28.2 % — ABNORMAL LOW (ref 34.8–46.6)
HGB: 8.8 g/dL — ABNORMAL LOW (ref 11.6–15.9)
Immature Retic Fract: 5.5 % (ref 1.60–10.00)
LYMPH%: 29.7 % (ref 14.0–49.7)
MCH: 25.5 pg (ref 25.1–34.0)
MCHC: 31.2 g/dL — ABNORMAL LOW (ref 31.5–36.0)
MCV: 81.7 fL (ref 79.5–101.0)
MONO#: 0.3 10*3/uL (ref 0.1–0.9)
MONO%: 5.1 % (ref 0.0–14.0)
NEUT#: 3.9 10*3/uL (ref 1.5–6.5)
NEUT%: 59.6 % (ref 38.4–76.8)
Platelets: 217 10*3/uL (ref 145–400)
RBC: 3.45 10*6/uL — ABNORMAL LOW (ref 3.70–5.45)
RDW: 15.1 % — ABNORMAL HIGH (ref 11.2–14.5)
Retic %: 0.97 % (ref 0.70–2.10)
Retic Ct Abs: 33.47 10*3/uL — ABNORMAL LOW (ref 33.70–90.70)
WBC: 6.5 10*3/uL (ref 3.9–10.3)
lymph#: 1.9 10*3/uL (ref 0.9–3.3)

## 2015-12-12 LAB — COMPREHENSIVE METABOLIC PANEL
ALT: 15 U/L (ref 0–55)
AST: 18 U/L (ref 5–34)
Albumin: 3.4 g/dL — ABNORMAL LOW (ref 3.5–5.0)
Alkaline Phosphatase: 74 U/L (ref 40–150)
Anion Gap: 9 mEq/L (ref 3–11)
BUN: 15.7 mg/dL (ref 7.0–26.0)
CO2: 26 mEq/L (ref 22–29)
Calcium: 9.8 mg/dL (ref 8.4–10.4)
Chloride: 104 mEq/L (ref 98–109)
Creatinine: 1.2 mg/dL — ABNORMAL HIGH (ref 0.6–1.1)
EGFR: 45 mL/min/{1.73_m2} — ABNORMAL LOW (ref >90)
Glucose: 120 mg/dl (ref 70–140)
Potassium: 4.6 mEq/L (ref 3.5–5.1)
Sodium: 139 mEq/L (ref 136–145)
Total Bilirubin: <0.3 mg/dL (ref 0.20–1.20)
Total Protein: 6.2 g/dL — ABNORMAL LOW (ref 6.4–8.3)

## 2015-12-12 NOTE — Telephone Encounter (Signed)
Avs report and appt schd given per 12/12/15 los. °

## 2015-12-12 NOTE — Assessment & Plan Note (Signed)
We discussed some of the risks, benefits, and alternatives of erythropoietin stimulating agents such as Procrit or Aranesp. The patient is symptomatic from anemia and the EPO level is pending.  Based on the history, it is likely that she have recurrence of anemia due to chronic kidney disease Some of the side-effects to be expected including risks of allergic reactions, skin rashes, headaches, risk of blood clots including heart attack and stroke. There is rare risks of causing growth of cancers.The patient is willing to proceed and went ahead to sign consent today. I will proceed to start her on darbepoetin injection every other week started next week. Goal of therapy is to keep hemoglobin greater than 11

## 2015-12-12 NOTE — Assessment & Plan Note (Signed)
The patient has family history of breast cancer. She had recurrent left breast surgery in the past. She had recent mammogram that is reported as negative. Examination today is benign. I reassured the patient

## 2015-12-12 NOTE — Progress Notes (Signed)
[place] OFFICE PROGRESS NOTE  Abigail Hams, MD SUMMARY OF HEMATOLOGIC HISTORY:  This is a patient that has been followed here since 2011 for chronic anemia In 2011, she had a bone marrow aspirate and biopsy that came back normal. The patient has been receiving Aranesp injection for chronic anemia up until 2014. It was subsequently discontinued due stability of the anemia. Starting 05/27/2014, darbepoetin was resumed at 200 g every other month to keep hemoglobin greater than 10 g. Treatment was discontinued in October 2016 INTERVAL HISTORY: Abigail Hoover 70 y.o. female returns for follow-up. She complained of fatigue She has a family history of breast cancer with numerous left breast surgeries that came back benign. The patient denies any recent signs or symptoms of bleeding such as spontaneous epistaxis, hematuria or hematochezia. She denies any recent abnormal breast examination, palpable mass, abnormal breast appearance or nipple changes   I have reviewed the past medical history, past surgical history, social history and family history with the patient and they are unchanged from previous note.  ALLERGIES:  is allergic to ezetimibe and atorvastatin.  MEDICATIONS:  Current Outpatient Prescriptions  Medication Sig Dispense Refill  . aspirin 81 MG tablet Take 81 mg by mouth daily.      . Blood Glucose Monitoring Suppl (ONE TOUCH ULTRA 2) W/DEVICE KIT Use to check blood sugars 1 time per day 1 each 0  . calcium citrate (CALCITRATE - DOSED IN MG ELEMENTAL CALCIUM) 950 MG tablet Take 200 mg of elemental calcium by mouth every other day. 1/2 tab    . dexlansoprazole (DEXILANT) 60 MG capsule Take 60 mg by mouth daily.      . furosemide (LASIX) 20 MG tablet Take 20 mg by mouth daily.    Marland Kitchen gabapentin (NEURONTIN) 300 MG capsule Take 300 mg by mouth 4 (four) times daily.    Marland Kitchen GLYSET 50 MG tablet TAKE ONE TABLET BY MOUTH TWICE A DAY 180 tablet 1  . ibandronate (BONIVA) 150  MG tablet TAKE 1 TAB ONCE A MONTH IN THE AM ON AN EMPTY STOMACH WITH FULL GLAS OF WATER STAY UPRIGHT 30 MIN 1 tablet 5  . isosorbide mononitrate (IMDUR) 60 MG 24 hr tablet Take 1 tablet (60 mg total) by mouth daily. 30 tablet 3  . JANUMET 50-500 MG tablet TAKE 1 TABLET BY MOUTH TWICE A DAY WITH MEALS 60 tablet 0  . levothyroxine (SYNTHROID, LEVOTHROID) 88 MCG tablet Take 1 tablet (88 mcg total) by mouth daily. 90 tablet 1  . losartan (COZAAR) 50 MG tablet     . metoprolol succinate (TOPROL-XL) 50 MG 24 hr tablet Take 1 tablet (50 mg total) by mouth daily. Take with or immediately following a meal. (Patient taking differently: Take 25 mg by mouth daily. Take with or immediately following a meal.) 30 tablet 3  . MYRBETRIQ 25 MG TB24 Take 1 tablet by mouth Daily.    . pentosan polysulfate (ELMIRON) 100 MG capsule Take 100 mg by mouth 3 (three) times daily before meals.     . pravastatin (PRAVACHOL) 20 MG tablet Take 20 mg by mouth daily.    Marland Kitchen CARAFATE 1 GM/10ML suspension     . LORazepam (ATIVAN) 0.5 MG tablet TAKE 1 TABLET BY MOUTH 30 MINUTES PRIOR TO MRI, REPEAT IF NEEDED  0  . nitroGLYCERIN (NITROSTAT) 0.4 MG SL tablet Place 1 tablet (0.4 mg total) under the tongue every 5 (five) minutes x 3 doses as needed for chest pain. (Patient not taking: Reported  on 12/12/2015) 25 tablet 12   No current facility-administered medications for this visit.      REVIEW OF SYSTEMS:   Constitutional: Denies fevers, chills or night sweats Eyes: Denies blurriness of vision Ears, nose, mouth, throat, and face: Denies mucositis or sore throat Respiratory: Denies cough, dyspnea or wheezes Cardiovascular: Denies palpitation, chest discomfort or lower extremity swelling Gastrointestinal:  Denies nausea, heartburn or change in bowel habits Skin: Denies abnormal skin rashes Lymphatics: Denies new lymphadenopathy or easy bruising Neurological:Denies numbness, tingling or new weaknesses Behavioral/Psych: Mood is stable,  no new changes  All other systems were reviewed with the patient and are negative.  PHYSICAL EXAMINATION: ECOG PERFORMANCE STATUS: 0 - Asymptomatic  Vitals:   12/12/15 1456  BP: (!) 144/51  Pulse: 65  Resp: 18  Temp: 97.7 F (36.5 C)   Filed Weights   12/12/15 1456  Weight: 115 lb (52.2 kg)    GENERAL:alert, no distress and comfortable SKIN: skin color, texture, turgor are normal, no rashes or significant lesions EYES: normal, Conjunctiva are pink and non-injected, sclera clear OROPHARYNX:no exudate, no erythema and lips, buccal mucosa, and tongue normal  NECK: supple, thyroid normal size, non-tender, without nodularity LYMPH:  no palpable lymphadenopathy in the cervical, axillary or inguinal LUNGS: clear to auscultation and percussion with normal breathing effort HEART: regular rate & rhythm and no murmurs and no lower extremity edema ABDOMEN:abdomen soft, non-tender and normal bowel sounds Musculoskeletal:no cyanosis of digits and no clubbing  NEURO: alert & oriented x 3 with fluent speech, no focal motor/sensory deficits Bilateral breast examination revealed well-healed left lumpectomy scar with no other abnormalities  LABORATORY DATA:  I have reviewed the data as listed     Component Value Date/Time   NA 138 08/14/2015 1258   K 5.1 08/14/2015 1258   CL 102 07/13/2015 1356   CL 103 07/27/2012 1259   CO2 26 08/14/2015 1258   GLUCOSE 157 (H) 08/14/2015 1258   GLUCOSE 118 (H) 07/27/2012 1259   BUN 18.3 08/14/2015 1258   CREATININE 1.1 08/14/2015 1258   CALCIUM 9.4 08/14/2015 1258   PROT 6.1 07/13/2015 1356   PROT 6.3 (L) 02/10/2014 1136   ALBUMIN 3.8 07/13/2015 1356   ALBUMIN 3.9 02/10/2014 1136   AST 19 07/13/2015 1356   AST 15 02/10/2014 1136   ALT 14 07/13/2015 1356   ALT 9 02/10/2014 1136   ALKPHOS 56 07/13/2015 1356   ALKPHOS 56 02/10/2014 1136   BILITOT 0.3 07/13/2015 1356   BILITOT 0.35 02/10/2014 1136   GFRNONAA 56 (L) 01/04/2015 0430   GFRAA >60  01/04/2015 0430    No results found for: SPEP, UPEP  Lab Results  Component Value Date   WBC 6.5 12/12/2015   NEUTROABS 3.9 12/12/2015   HGB 8.8 (L) 12/12/2015   HCT 28.2 (L) 12/12/2015   MCV 81.7 12/12/2015   PLT 217 12/12/2015      Chemistry      Component Value Date/Time   NA 138 08/14/2015 1258   K 5.1 08/14/2015 1258   CL 102 07/13/2015 1356   CL 103 07/27/2012 1259   CO2 26 08/14/2015 1258   BUN 18.3 08/14/2015 1258   CREATININE 1.1 08/14/2015 1258      Component Value Date/Time   CALCIUM 9.4 08/14/2015 1258   ALKPHOS 56 07/13/2015 1356   ALKPHOS 56 02/10/2014 1136   AST 19 07/13/2015 1356   AST 15 02/10/2014 1136   ALT 14 07/13/2015 1356   ALT 9 02/10/2014 1136  BILITOT 0.3 07/13/2015 1356   BILITOT 0.35 02/10/2014 1136      ASSESSMENT & PLAN:  Anemia in chronic kidney disease We discussed some of the risks, benefits, and alternatives of erythropoietin stimulating agents such as Procrit or Aranesp. The patient is symptomatic from anemia and the EPO level is pending.  Based on the history, it is likely that she have recurrence of anemia due to chronic kidney disease Some of the side-effects to be expected including risks of allergic reactions, skin rashes, headaches, risk of blood clots including heart attack and stroke. There is rare risks of causing growth of cancers.The patient is willing to proceed and went ahead to sign consent today. I will proceed to start her on darbepoetin injection every other week started next week. Goal of therapy is to keep hemoglobin greater than 11    Family history of breast cancer The patient has family history of breast cancer. She had recurrent left breast surgery in the past. She had recent mammogram that is reported as negative. Examination today is benign. I reassured the patient   All questions were answered. The patient knows to call the clinic with any problems, questions or concerns. No barriers to learning was  detected.  I spent 15 minutes counseling the patient face to face. The total time spent in the appointment was 20 minutes and more than 50% was on counseling.     [facility], , MD 9/19/20173:15 PM

## 2015-12-13 LAB — VITAMIN B12: Vitamin B12: 219 pg/mL (ref 211–946)

## 2015-12-13 LAB — IRON AND TIBC
%SAT: 24 % (ref 21–57)
Iron: 65 ug/dL (ref 41–142)
TIBC: 268 ug/dL (ref 236–444)
UIBC: 202 ug/dL (ref 120–384)

## 2015-12-13 LAB — FERRITIN: Ferritin: 83 ng/ml (ref 9–269)

## 2015-12-13 LAB — SEDIMENTATION RATE: Sedimentation Rate-Westergren: 20 mm/hr (ref 0–40)

## 2015-12-13 LAB — ERYTHROPOIETIN: Erythropoietin: 18.4 m[IU]/mL (ref 2.6–18.5)

## 2015-12-18 ENCOUNTER — Other Ambulatory Visit: Payer: Self-pay | Admitting: Hematology and Oncology

## 2015-12-18 ENCOUNTER — Other Ambulatory Visit: Payer: Self-pay | Admitting: Endocrinology

## 2015-12-18 ENCOUNTER — Other Ambulatory Visit: Payer: Self-pay | Admitting: *Deleted

## 2015-12-18 DIAGNOSIS — N183 Chronic kidney disease, stage 3 unspecified: Secondary | ICD-10-CM | POA: Insufficient documentation

## 2015-12-18 DIAGNOSIS — D631 Anemia in chronic kidney disease: Secondary | ICD-10-CM | POA: Insufficient documentation

## 2015-12-18 MED ORDER — SITAGLIPTIN PHOS-METFORMIN HCL 50-500 MG PO TABS: 1.0000 | ORAL_TABLET | Freq: Two times a day (BID) | ORAL | 0 refills | Status: DC

## 2015-12-19 ENCOUNTER — Other Ambulatory Visit (HOSPITAL_BASED_OUTPATIENT_CLINIC_OR_DEPARTMENT_OTHER): Payer: Medicare Other

## 2015-12-19 ENCOUNTER — Ambulatory Visit (HOSPITAL_BASED_OUTPATIENT_CLINIC_OR_DEPARTMENT_OTHER): Payer: Medicare Other

## 2015-12-19 VITALS — BP 128/47 | HR 65 | Temp 98.2°F | Resp 18

## 2015-12-19 DIAGNOSIS — M1711 Unilateral primary osteoarthritis, right knee: Secondary | ICD-10-CM | POA: Diagnosis not present

## 2015-12-19 DIAGNOSIS — M23331 Other meniscus derangements, other medial meniscus, right knee: Secondary | ICD-10-CM | POA: Diagnosis not present

## 2015-12-19 DIAGNOSIS — N183 Chronic kidney disease, stage 3 unspecified: Secondary | ICD-10-CM

## 2015-12-19 DIAGNOSIS — D631 Anemia in chronic kidney disease: Secondary | ICD-10-CM

## 2015-12-19 DIAGNOSIS — M1712 Unilateral primary osteoarthritis, left knee: Secondary | ICD-10-CM | POA: Diagnosis not present

## 2015-12-19 DIAGNOSIS — M23332 Other meniscus derangements, other medial meniscus, left knee: Secondary | ICD-10-CM | POA: Diagnosis not present

## 2015-12-19 DIAGNOSIS — N189 Chronic kidney disease, unspecified: Secondary | ICD-10-CM

## 2015-12-19 LAB — CBC WITH DIFFERENTIAL/PLATELET
BASO%: 0.7 % (ref 0.0–2.0)
Basophils Absolute: 0 10*3/uL (ref 0.0–0.1)
EOS%: 5.9 % (ref 0.0–7.0)
Eosinophils Absolute: 0.3 10*3/uL (ref 0.0–0.5)
HCT: 27.6 % — ABNORMAL LOW (ref 34.8–46.6)
HGB: 8.6 g/dL — ABNORMAL LOW (ref 11.6–15.9)
LYMPH%: 24.8 % (ref 14.0–49.7)
MCH: 25.2 pg (ref 25.1–34.0)
MCHC: 31 g/dL — ABNORMAL LOW (ref 31.5–36.0)
MCV: 81.2 fL (ref 79.5–101.0)
MONO#: 0.3 10*3/uL (ref 0.1–0.9)
MONO%: 5.6 % (ref 0.0–14.0)
NEUT#: 3.3 10*3/uL (ref 1.5–6.5)
NEUT%: 63 % (ref 38.4–76.8)
Platelets: 214 10*3/uL (ref 145–400)
RBC: 3.4 10*6/uL — ABNORMAL LOW (ref 3.70–5.45)
RDW: 15.1 % — ABNORMAL HIGH (ref 11.2–14.5)
WBC: 5.3 10*3/uL (ref 3.9–10.3)
lymph#: 1.3 10*3/uL (ref 0.9–3.3)

## 2015-12-19 MED ORDER — DARBEPOETIN ALFA 200 MCG/0.4ML IJ SOSY
200.0000 ug | PREFILLED_SYRINGE | Freq: Once | INTRAMUSCULAR | Status: AC
  Administered 2015-12-19: 200 ug via SUBCUTANEOUS
  Filled 2015-12-19: qty 0.4

## 2015-12-19 NOTE — Patient Instructions (Signed)
Darbepoetin Alfa injection What is this medicine? DARBEPOETIN ALFA (dar be POE e tin AL fa) helps your body make more red blood cells. It is used to treat anemia caused by chronic kidney failure and chemotherapy. This medicine may be used for other purposes; ask your health care provider or pharmacist if you have questions. What should I tell my health care provider before I take this medicine? They need to know if you have any of these conditions: -blood clotting disorders or history of blood clots -cancer patient not on chemotherapy -cystic fibrosis -heart disease, such as angina, heart failure, or a history of a heart attack -hemoglobin level of 12 g/dL or greater -high blood pressure -low levels of folate, iron, or vitamin B12 -seizures -an unusual or allergic reaction to darbepoetin, erythropoietin, albumin, hamster proteins, latex, other medicines, foods, dyes, or preservatives -pregnant or trying to get pregnant -breast-feeding How should I use this medicine? This medicine is for injection into a vein or under the skin. It is usually given by a health care professional in a hospital or clinic setting. If you get this medicine at home, you will be taught how to prepare and give this medicine. Do not shake the solution before you withdraw a dose. Use exactly as directed. Take your medicine at regular intervals. Do not take your medicine more often than directed. It is important that you put your used needles and syringes in a special sharps container. Do not put them in a trash can. If you do not have a sharps container, call your pharmacist or healthcare provider to get one. Talk to your pediatrician regarding the use of this medicine in children. While this medicine may be used in children as young as 1 year for selected conditions, precautions do apply. Overdosage: If you think you have taken too much of this medicine contact a poison control center or emergency room at once. NOTE:  This medicine is only for you. Do not share this medicine with others. What if I miss a dose? If you miss a dose, take it as soon as you can. If it is almost time for your next dose, take only that dose. Do not take double or extra doses. What may interact with this medicine? Do not take this medicine with any of the following medications: -epoetin alfa This list may not describe all possible interactions. Give your health care provider a list of all the medicines, herbs, non-prescription drugs, or dietary supplements you use. Also tell them if you smoke, drink alcohol, or use illegal drugs. Some items may interact with your medicine. What should I watch for while using this medicine? Visit your prescriber or health care professional for regular checks on your progress and for the needed blood tests and blood pressure measurements. It is especially important for the doctor to make sure your hemoglobin level is in the desired range, to limit the risk of potential side effects and to give you the best benefit. Keep all appointments for any recommended tests. Check your blood pressure as directed. Ask your doctor what your blood pressure should be and when you should contact him or her. As your body makes more red blood cells, you may need to take iron, folic acid, or vitamin B supplements. Ask your doctor or health care provider which products are right for you. If you have kidney disease continue dietary restrictions, even though this medication can make you feel better. Talk with your doctor or health care professional about the   foods you eat and the vitamins that you take. What side effects may I notice from receiving this medicine? Side effects that you should report to your doctor or health care professional as soon as possible: -allergic reactions like skin rash, itching or hives, swelling of the face, lips, or tongue -breathing problems -changes in vision -chest pain -confusion, trouble speaking  or understanding -feeling faint or lightheaded, falls -high blood pressure -muscle aches or pains -pain, swelling, warmth in the leg -rapid weight gain -severe headaches -sudden numbness or weakness of the face, arm or leg -trouble walking, dizziness, loss of balance or coordination -seizures (convulsions) -swelling of the ankles, feet, hands -unusually weak or tired Side effects that usually do not require medical attention (report to your doctor or health care professional if they continue or are bothersome): -diarrhea -fever, chills (flu-like symptoms) -headaches -nausea, vomiting -redness, stinging, or swelling at site where injected This list may not describe all possible side effects. Call your doctor for medical advice about side effects. You may report side effects to FDA at 1-800-FDA-1088. Where should I keep my medicine? Keep out of the reach of children. Store in a refrigerator between 2 and 8 degrees C (36 and 46 degrees F). Do not freeze. Do not shake. Throw away any unused portion if using a single-dose vial. Throw away any unused medicine after the expiration date. NOTE: This sheet is a summary. It may not cover all possible information. If you have questions about this medicine, talk to your doctor, pharmacist, or health care provider.    2016, Elsevier/Gold Standard. (2008-02-23 10:23:57)  

## 2015-12-21 DIAGNOSIS — N3010 Interstitial cystitis (chronic) without hematuria: Secondary | ICD-10-CM | POA: Diagnosis not present

## 2015-12-21 DIAGNOSIS — R350 Frequency of micturition: Secondary | ICD-10-CM | POA: Diagnosis not present

## 2015-12-21 DIAGNOSIS — R1030 Lower abdominal pain, unspecified: Secondary | ICD-10-CM | POA: Diagnosis not present

## 2015-12-21 DIAGNOSIS — R102 Pelvic and perineal pain: Secondary | ICD-10-CM | POA: Diagnosis not present

## 2015-12-21 DIAGNOSIS — R3915 Urgency of urination: Secondary | ICD-10-CM | POA: Diagnosis not present

## 2015-12-21 DIAGNOSIS — R300 Dysuria: Secondary | ICD-10-CM | POA: Diagnosis not present

## 2015-12-28 ENCOUNTER — Other Ambulatory Visit: Payer: Self-pay | Admitting: Endocrinology

## 2015-12-28 NOTE — Telephone Encounter (Signed)
Need refill of  pravastatin (PRAVACHOL) 20 MG tablet 3 month supply.  2 month supply of sitaGLIPtin-metformin (JANUMET) 50-500 MG tablet  [id number] - [place], [place] [phone] (Phone) [phone] (Fax)

## 2015-12-28 NOTE — Telephone Encounter (Signed)
Refused ? ?

## 2015-12-28 NOTE — Telephone Encounter (Signed)
Refuse 

## 2015-12-28 NOTE — Telephone Encounter (Signed)
Please advise, she has not been here since April and Cancelled her appointment for this month.

## 2015-12-30 ENCOUNTER — Other Ambulatory Visit: Payer: Self-pay | Admitting: Endocrinology

## 2016-01-02 ENCOUNTER — Other Ambulatory Visit (HOSPITAL_BASED_OUTPATIENT_CLINIC_OR_DEPARTMENT_OTHER): Payer: Medicare Other

## 2016-01-02 ENCOUNTER — Ambulatory Visit (HOSPITAL_BASED_OUTPATIENT_CLINIC_OR_DEPARTMENT_OTHER): Payer: Medicare Other

## 2016-01-02 VITALS — BP 145/50 | HR 70 | Temp 98.0°F | Resp 20

## 2016-01-02 DIAGNOSIS — D631 Anemia in chronic kidney disease: Secondary | ICD-10-CM

## 2016-01-02 DIAGNOSIS — N183 Chronic kidney disease, stage 3 unspecified: Secondary | ICD-10-CM

## 2016-01-02 DIAGNOSIS — N189 Chronic kidney disease, unspecified: Secondary | ICD-10-CM

## 2016-01-02 LAB — CBC WITH DIFFERENTIAL/PLATELET
BASO%: 0.5 % (ref 0.0–2.0)
Basophils Absolute: 0 10*3/uL (ref 0.0–0.1)
EOS%: 7 % (ref 0.0–7.0)
Eosinophils Absolute: 0.3 10*3/uL (ref 0.0–0.5)
HCT: 33.2 % — ABNORMAL LOW (ref 34.8–46.6)
HGB: 10.1 g/dL — ABNORMAL LOW (ref 11.6–15.9)
LYMPH%: 25 % (ref 14.0–49.7)
MCH: 24.9 pg — ABNORMAL LOW (ref 25.1–34.0)
MCHC: 30.4 g/dL — ABNORMAL LOW (ref 31.5–36.0)
MCV: 82.1 fL (ref 79.5–101.0)
MONO#: 0.3 10*3/uL (ref 0.1–0.9)
MONO%: 6.7 % (ref 0.0–14.0)
NEUT#: 2.8 10*3/uL (ref 1.5–6.5)
NEUT%: 60.8 % (ref 38.4–76.8)
Platelets: 271 10*3/uL (ref 145–400)
RBC: 4.04 10*6/uL (ref 3.70–5.45)
RDW: 16.6 % — ABNORMAL HIGH (ref 11.2–14.5)
WBC: 4.5 10*3/uL (ref 3.9–10.3)
lymph#: 1.1 10*3/uL (ref 0.9–3.3)

## 2016-01-02 MED ORDER — DARBEPOETIN ALFA 200 MCG/0.4ML IJ SOSY
200.0000 ug | PREFILLED_SYRINGE | Freq: Once | INTRAMUSCULAR | Status: AC
  Administered 2016-01-02: 200 ug via SUBCUTANEOUS
  Filled 2016-01-02: qty 0.4

## 2016-01-02 NOTE — Patient Instructions (Signed)
Darbepoetin Alfa injection What is this medicine? DARBEPOETIN ALFA (dar be POE e tin AL fa) helps your body make more red blood cells. It is used to treat anemia caused by chronic kidney failure and chemotherapy. This medicine may be used for other purposes; ask your health care provider or pharmacist if you have questions. What should I tell my health care provider before I take this medicine? They need to know if you have any of these conditions: -blood clotting disorders or history of blood clots -cancer patient not on chemotherapy -cystic fibrosis -heart disease, such as angina, heart failure, or a history of a heart attack -hemoglobin level of 12 g/dL or greater -high blood pressure -low levels of folate, iron, or vitamin B12 -seizures -an unusual or allergic reaction to darbepoetin, erythropoietin, albumin, hamster proteins, latex, other medicines, foods, dyes, or preservatives -pregnant or trying to get pregnant -breast-feeding How should I use this medicine? This medicine is for injection into a vein or under the skin. It is usually given by a health care professional in a hospital or clinic setting. If you get this medicine at home, you will be taught how to prepare and give this medicine. Do not shake the solution before you withdraw a dose. Use exactly as directed. Take your medicine at regular intervals. Do not take your medicine more often than directed. It is important that you put your used needles and syringes in a special sharps container. Do not put them in a trash can. If you do not have a sharps container, call your pharmacist or healthcare provider to get one. Talk to your pediatrician regarding the use of this medicine in children. While this medicine may be used in children as young as 1 year for selected conditions, precautions do apply. Overdosage: If you think you have taken too much of this medicine contact a poison control center or emergency room at once. NOTE:  This medicine is only for you. Do not share this medicine with others. What if I miss a dose? If you miss a dose, take it as soon as you can. If it is almost time for your next dose, take only that dose. Do not take double or extra doses. What may interact with this medicine? Do not take this medicine with any of the following medications: -epoetin alfa This list may not describe all possible interactions. Give your health care provider a list of all the medicines, herbs, non-prescription drugs, or dietary supplements you use. Also tell them if you smoke, drink alcohol, or use illegal drugs. Some items may interact with your medicine. What should I watch for while using this medicine? Visit your prescriber or health care professional for regular checks on your progress and for the needed blood tests and blood pressure measurements. It is especially important for the doctor to make sure your hemoglobin level is in the desired range, to limit the risk of potential side effects and to give you the best benefit. Keep all appointments for any recommended tests. Check your blood pressure as directed. Ask your doctor what your blood pressure should be and when you should contact him or her. As your body makes more red blood cells, you may need to take iron, folic acid, or vitamin B supplements. Ask your doctor or health care provider which products are right for you. If you have kidney disease continue dietary restrictions, even though this medication can make you feel better. Talk with your doctor or health care professional about the   foods you eat and the vitamins that you take. What side effects may I notice from receiving this medicine? Side effects that you should report to your doctor or health care professional as soon as possible: -allergic reactions like skin rash, itching or hives, swelling of the face, lips, or tongue -breathing problems -changes in vision -chest pain -confusion, trouble speaking  or understanding -feeling faint or lightheaded, falls -high blood pressure -muscle aches or pains -pain, swelling, warmth in the leg -rapid weight gain -severe headaches -sudden numbness or weakness of the face, arm or leg -trouble walking, dizziness, loss of balance or coordination -seizures (convulsions) -swelling of the ankles, feet, hands -unusually weak or tired Side effects that usually do not require medical attention (report to your doctor or health care professional if they continue or are bothersome): -diarrhea -fever, chills (flu-like symptoms) -headaches -nausea, vomiting -redness, stinging, or swelling at site where injected This list may not describe all possible side effects. Call your doctor for medical advice about side effects. You may report side effects to FDA at 1-800-FDA-1088. Where should I keep my medicine? Keep out of the reach of children. Store in a refrigerator between 2 and 8 degrees C (36 and 46 degrees F). Do not freeze. Do not shake. Throw away any unused portion if using a single-dose vial. Throw away any unused medicine after the expiration date. NOTE: This sheet is a summary. It may not cover all possible information. If you have questions about this medicine, talk to your doctor, pharmacist, or health care provider.    2016, Elsevier/Gold Standard. (2008-02-23 10:23:57)  

## 2016-01-10 ENCOUNTER — Other Ambulatory Visit: Payer: Medicare Other

## 2016-01-15 ENCOUNTER — Ambulatory Visit: Payer: Medicare Other | Admitting: Endocrinology

## 2016-01-16 ENCOUNTER — Other Ambulatory Visit (HOSPITAL_BASED_OUTPATIENT_CLINIC_OR_DEPARTMENT_OTHER): Payer: Medicare Other

## 2016-01-16 ENCOUNTER — Ambulatory Visit (HOSPITAL_BASED_OUTPATIENT_CLINIC_OR_DEPARTMENT_OTHER): Payer: Medicare Other

## 2016-01-16 VITALS — BP 168/54 | HR 67 | Temp 97.9°F | Resp 18

## 2016-01-16 DIAGNOSIS — D631 Anemia in chronic kidney disease: Secondary | ICD-10-CM

## 2016-01-16 DIAGNOSIS — N183 Chronic kidney disease, stage 3 unspecified: Secondary | ICD-10-CM

## 2016-01-16 DIAGNOSIS — N189 Chronic kidney disease, unspecified: Secondary | ICD-10-CM

## 2016-01-16 LAB — CBC WITH DIFFERENTIAL/PLATELET
BASO%: 0.4 % (ref 0.0–2.0)
Basophils Absolute: 0 10*3/uL (ref 0.0–0.1)
EOS%: 4.4 % (ref 0.0–7.0)
Eosinophils Absolute: 0.2 10*3/uL (ref 0.0–0.5)
HCT: 35.4 % (ref 34.8–46.6)
HGB: 10.7 g/dL — ABNORMAL LOW (ref 11.6–15.9)
LYMPH%: 23.6 % (ref 14.0–49.7)
MCH: 24.6 pg — ABNORMAL LOW (ref 25.1–34.0)
MCHC: 30.2 g/dL — ABNORMAL LOW (ref 31.5–36.0)
MCV: 81.6 fL (ref 79.5–101.0)
MONO#: 0.4 10*3/uL (ref 0.1–0.9)
MONO%: 7 % (ref 0.0–14.0)
NEUT#: 3.4 10*3/uL (ref 1.5–6.5)
NEUT%: 64.6 % (ref 38.4–76.8)
Platelets: 269 10*3/uL (ref 145–400)
RBC: 4.34 10*6/uL (ref 3.70–5.45)
RDW: 16.4 % — ABNORMAL HIGH (ref 11.2–14.5)
WBC: 5.3 10*3/uL (ref 3.9–10.3)
lymph#: 1.3 10*3/uL (ref 0.9–3.3)

## 2016-01-16 MED ORDER — DARBEPOETIN ALFA 200 MCG/0.4ML IJ SOSY
200.0000 ug | PREFILLED_SYRINGE | Freq: Once | INTRAMUSCULAR | Status: AC
  Administered 2016-01-16: 200 ug via SUBCUTANEOUS
  Filled 2016-01-16: qty 0.4

## 2016-01-16 NOTE — Patient Instructions (Signed)
Darbepoetin Alfa injection What is this medicine? DARBEPOETIN ALFA (dar be POE e tin AL fa) helps your body make more red blood cells. It is used to treat anemia caused by chronic kidney failure and chemotherapy. This medicine may be used for other purposes; ask your health care provider or pharmacist if you have questions. What should I tell my health care provider before I take this medicine? They need to know if you have any of these conditions: -blood clotting disorders or history of blood clots -cancer patient not on chemotherapy -cystic fibrosis -heart disease, such as angina, heart failure, or a history of a heart attack -hemoglobin level of 12 g/dL or greater -high blood pressure -low levels of folate, iron, or vitamin B12 -seizures -an unusual or allergic reaction to darbepoetin, erythropoietin, albumin, hamster proteins, latex, other medicines, foods, dyes, or preservatives -pregnant or trying to get pregnant -breast-feeding How should I use this medicine? This medicine is for injection into a vein or under the skin. It is usually given by a health care professional in a hospital or clinic setting. If you get this medicine at home, you will be taught how to prepare and give this medicine. Do not shake the solution before you withdraw a dose. Use exactly as directed. Take your medicine at regular intervals. Do not take your medicine more often than directed. It is important that you put your used needles and syringes in a special sharps container. Do not put them in a trash can. If you do not have a sharps container, call your pharmacist or healthcare provider to get one. Talk to your pediatrician regarding the use of this medicine in children. While this medicine may be used in children as young as 1 year for selected conditions, precautions do apply. Overdosage: If you think you have taken too much of this medicine contact a poison control center or emergency room at once. NOTE:  This medicine is only for you. Do not share this medicine with others. What if I miss a dose? If you miss a dose, take it as soon as you can. If it is almost time for your next dose, take only that dose. Do not take double or extra doses. What may interact with this medicine? Do not take this medicine with any of the following medications: -epoetin alfa This list may not describe all possible interactions. Give your health care provider a list of all the medicines, herbs, non-prescription drugs, or dietary supplements you use. Also tell them if you smoke, drink alcohol, or use illegal drugs. Some items may interact with your medicine. What should I watch for while using this medicine? Visit your prescriber or health care professional for regular checks on your progress and for the needed blood tests and blood pressure measurements. It is especially important for the doctor to make sure your hemoglobin level is in the desired range, to limit the risk of potential side effects and to give you the best benefit. Keep all appointments for any recommended tests. Check your blood pressure as directed. Ask your doctor what your blood pressure should be and when you should contact him or her. As your body makes more red blood cells, you may need to take iron, folic acid, or vitamin B supplements. Ask your doctor or health care provider which products are right for you. If you have kidney disease continue dietary restrictions, even though this medication can make you feel better. Talk with your doctor or health care professional about the   foods you eat and the vitamins that you take. What side effects may I notice from receiving this medicine? Side effects that you should report to your doctor or health care professional as soon as possible: -allergic reactions like skin rash, itching or hives, swelling of the face, lips, or tongue -breathing problems -changes in vision -chest pain -confusion, trouble speaking  or understanding -feeling faint or lightheaded, falls -high blood pressure -muscle aches or pains -pain, swelling, warmth in the leg -rapid weight gain -severe headaches -sudden numbness or weakness of the face, arm or leg -trouble walking, dizziness, loss of balance or coordination -seizures (convulsions) -swelling of the ankles, feet, hands -unusually weak or tired Side effects that usually do not require medical attention (report to your doctor or health care professional if they continue or are bothersome): -diarrhea -fever, chills (flu-like symptoms) -headaches -nausea, vomiting -redness, stinging, or swelling at site where injected This list may not describe all possible side effects. Call your doctor for medical advice about side effects. You may report side effects to FDA at 1-800-FDA-1088. Where should I keep my medicine? Keep out of the reach of children. Store in a refrigerator between 2 and 8 degrees C (36 and 46 degrees F). Do not freeze. Do not shake. Throw away any unused portion if using a single-dose vial. Throw away any unused medicine after the expiration date. NOTE: This sheet is a summary. It may not cover all possible information. If you have questions about this medicine, talk to your doctor, pharmacist, or health care provider.    2016, Elsevier/Gold Standard. (2008-02-23 10:23:57)  

## 2016-01-19 ENCOUNTER — Other Ambulatory Visit (INDEPENDENT_AMBULATORY_CARE_PROVIDER_SITE_OTHER): Payer: Medicare Other

## 2016-01-19 DIAGNOSIS — E1165 Type 2 diabetes mellitus with hyperglycemia: Secondary | ICD-10-CM

## 2016-01-19 LAB — LIPID PANEL
Cholesterol: 150 mg/dL (ref 0–200)
HDL: 70.6 mg/dL (ref >39.00)
LDL Cholesterol: 60 mg/dL (ref 0–99)
NonHDL: 78.91
Total CHOL/HDL Ratio: 2
Triglycerides: 97 mg/dL (ref 0.0–149.0)
VLDL: 19.4 mg/dL (ref 0.0–40.0)

## 2016-01-19 LAB — BASIC METABOLIC PANEL
BUN: 17 mg/dL (ref 6–23)
CO2: 28 mEq/L (ref 19–32)
Calcium: 10 mg/dL (ref 8.4–10.5)
Chloride: 102 mEq/L (ref 96–112)
Creatinine, Ser: 1.06 mg/dL (ref 0.40–1.20)
GFR: 54.04 mL/min — ABNORMAL LOW (ref >60.00)
Glucose, Bld: 187 mg/dL — ABNORMAL HIGH (ref 70–99)
Potassium: 4.9 mEq/L (ref 3.5–5.1)
Sodium: 136 mEq/L (ref 135–145)

## 2016-01-19 LAB — HEMOGLOBIN A1C: Hgb A1c MFr Bld: 5.8 % (ref 4.6–6.5)

## 2016-01-22 ENCOUNTER — Other Ambulatory Visit: Payer: Medicare Other

## 2016-01-22 ENCOUNTER — Encounter: Payer: Self-pay | Admitting: *Deleted

## 2016-01-22 ENCOUNTER — Encounter: Payer: Self-pay | Admitting: Hematology and Oncology

## 2016-01-22 DIAGNOSIS — I25111 Atherosclerotic heart disease of native coronary artery with angina pectoris with documented spasm: Secondary | ICD-10-CM | POA: Diagnosis not present

## 2016-01-22 DIAGNOSIS — I10 Essential (primary) hypertension: Secondary | ICD-10-CM | POA: Diagnosis not present

## 2016-01-22 DIAGNOSIS — E119 Type 2 diabetes mellitus without complications: Secondary | ICD-10-CM | POA: Diagnosis not present

## 2016-01-22 DIAGNOSIS — E785 Hyperlipidemia, unspecified: Secondary | ICD-10-CM | POA: Diagnosis not present

## 2016-01-22 DIAGNOSIS — E559 Vitamin D deficiency, unspecified: Secondary | ICD-10-CM | POA: Diagnosis not present

## 2016-01-22 DIAGNOSIS — Z01810 Encounter for preprocedural cardiovascular examination: Secondary | ICD-10-CM | POA: Diagnosis not present

## 2016-01-22 DIAGNOSIS — I252 Old myocardial infarction: Secondary | ICD-10-CM | POA: Diagnosis not present

## 2016-01-22 DIAGNOSIS — K219 Gastro-esophageal reflux disease without esophagitis: Secondary | ICD-10-CM | POA: Diagnosis not present

## 2016-01-22 DIAGNOSIS — E039 Hypothyroidism, unspecified: Secondary | ICD-10-CM | POA: Diagnosis not present

## 2016-01-22 LAB — TSH: TSH: 0.32 u[IU]/mL — ABNORMAL LOW (ref 0.35–4.50)

## 2016-01-22 NOTE — Progress Notes (Signed)
Faxed Letter from Dr. Alvy Bimler to Abigail Hoover at [facility] fax [phone].  Clearance from Hematology standpoint for knee surgery.

## 2016-01-23 DIAGNOSIS — K146 Glossodynia: Secondary | ICD-10-CM | POA: Diagnosis not present

## 2016-01-23 DIAGNOSIS — R070 Pain in throat: Secondary | ICD-10-CM | POA: Insufficient documentation

## 2016-01-23 NOTE — Progress Notes (Signed)
Late entry. Patients clearance form fax to [place] at [phone]. Attention Trudi Ida.

## 2016-01-25 ENCOUNTER — Encounter: Payer: Self-pay | Admitting: Endocrinology

## 2016-01-25 ENCOUNTER — Ambulatory Visit (INDEPENDENT_AMBULATORY_CARE_PROVIDER_SITE_OTHER): Payer: Medicare Other | Admitting: Endocrinology

## 2016-01-25 ENCOUNTER — Other Ambulatory Visit: Payer: Self-pay

## 2016-01-25 VITALS — BP 120/60 | HR 85 | Wt 112.0 lb

## 2016-01-25 DIAGNOSIS — I2510 Atherosclerotic heart disease of native coronary artery without angina pectoris: Secondary | ICD-10-CM | POA: Diagnosis not present

## 2016-01-25 DIAGNOSIS — I10 Essential (primary) hypertension: Secondary | ICD-10-CM | POA: Diagnosis not present

## 2016-01-25 DIAGNOSIS — E119 Type 2 diabetes mellitus without complications: Secondary | ICD-10-CM | POA: Diagnosis not present

## 2016-01-25 DIAGNOSIS — Z23 Encounter for immunization: Secondary | ICD-10-CM | POA: Diagnosis not present

## 2016-01-25 DIAGNOSIS — E063 Autoimmune thyroiditis: Secondary | ICD-10-CM

## 2016-01-25 DIAGNOSIS — E7800 Pure hypercholesterolemia, unspecified: Secondary | ICD-10-CM | POA: Diagnosis not present

## 2016-01-25 DIAGNOSIS — M25562 Pain in left knee: Secondary | ICD-10-CM | POA: Diagnosis not present

## 2016-01-25 DIAGNOSIS — E1165 Type 2 diabetes mellitus with hyperglycemia: Secondary | ICD-10-CM | POA: Diagnosis not present

## 2016-01-25 DIAGNOSIS — I6529 Occlusion and stenosis of unspecified carotid artery: Secondary | ICD-10-CM | POA: Diagnosis not present

## 2016-01-25 DIAGNOSIS — E038 Other specified hypothyroidism: Secondary | ICD-10-CM | POA: Diagnosis not present

## 2016-01-25 MED ORDER — SITAGLIPTIN PHOS-METFORMIN HCL 50-500 MG PO TABS: 1.0000 | ORAL_TABLET | Freq: Two times a day (BID) | ORAL | 0 refills | Status: DC

## 2016-01-25 MED ORDER — MIGLITOL 50 MG PO TABS: 50.0000 mg | ORAL_TABLET | Freq: Two times a day (BID) | ORAL | 0 refills | Status: DC

## 2016-01-25 NOTE — Patient Instructions (Signed)
Reduce Synthroid 88ug to 6 days per week then go to 75 daily  Check blood sugars on waking up  2-3 weekly  Also check blood sugars about 2 hours after a meal and do this after different meals by rotation  Recommended blood sugar levels on waking up is 90-130 and about 2 hours after meal is 130-160  Please bring your blood sugar monitor to each visit, thank you

## 2016-01-25 NOTE — Progress Notes (Signed)
Patient ID: Abigail Hoover, female   DOB: [birth year], 70 y.o.   MRN: [id number]    Reason for Appointment:  follow-up of various issues  History of Present Illness   Diagnosis: Type 2 DIABETES MELITUS, long-standing  She has had mild diabetes for several years but has required multiple drugs for control Usually has had postprandial hyperglycemia, this has been better controlled with adding Glyset to her main meals Has never been on insulin or a GLP-1 drug Actos had been stopped previously because of tendency to edema  Recent history:  She has not been seen in follow-up since 12/2014  Her A1c is improved at 5.8 She has not been followed regularly for 7 months  Current management, problems identified and blood sugar patterns:  She is on a low dose of metformin, 500 mg twice a day because of previous tendency to low sugars with higher doses  Her glucose monitor shows that she is checking blood sugars mostly about 2 hours after evening meal with overall good control: AVERAGE glucose at bedtime is 125 range 91-159  Has only sporadic readings waking up, does have one high reading of 164  Her weight is overall stable  She tries to eat small portions   Oral hypoglycemic drugs:   Glyset 50 mg before breakfast, lunch and dinner, Janumet 50/ 500 twice a day  Side effects from medications: None Proper timing of medications in relation to meals: Yes.          Monitors blood glucose: 1 times a day or less .    Glucometer: One Touch       Blood Glucose readings from download as above:  Overall MEDIAN 124    Meals: 2- 3 meals per day.  eating small breakfast and lunch but mostly with carbohydrates     Physical activity:  she is trying to do some walking            Dietician visit: Most recent: Unknown       Complications: are mild symptoms of neuropathy, no nephropathy or retinopathy   Wt Readings from Last 3 Encounters:  01/25/16 112 lb (50.8 kg)  12/12/15 115 lb (52.2  kg)  11/22/15 112 lb 9.6 oz (51.1 kg)   Lab Results  Component Value Date   HGBA1C 5.8 01/19/2016   HGBA1C 6.6 (H) 07/13/2015   HGBA1C 6.9 (H) 01/02/2015   Lab Results  Component Value Date   MICROALBUR 2.5 (H) 07/17/2015   LDLCALC 60 01/19/2016   CREATININE 1.06 01/19/2016        OTHER problems are in review of systems:      Medication List       Accurate as of 01/25/16 11:59 PM. Always use your most recent med list.          aspirin 81 MG tablet Take 81 mg by mouth daily.   calcium citrate 950 MG tablet Commonly known as:  CALCITRATE - dosed in mg elemental calcium Take 200 mg of elemental calcium by mouth every other day. 1/2 tab   CARAFATE 1 GM/10ML suspension Generic drug:  sucralfate   DEXILANT 60 MG capsule Generic drug:  dexlansoprazole Take 60 mg by mouth daily.   furosemide 20 MG tablet Commonly known as:  LASIX Take 20 mg by mouth daily.   gabapentin 300 MG capsule Commonly known as:  NEURONTIN Take 300 mg by mouth 4 (four) times daily.   ibandronate 150 MG tablet Commonly known as:  BONIVA TAKE 1 TAB  ONCE A MONTH IN THE AM ON AN EMPTY STOMACH WITH FULL GLAS OF WATER STAY UPRIGHT 30 MIN   isosorbide mononitrate 60 MG 24 hr tablet Commonly known as:  IMDUR Take 1 tablet (60 mg total) by mouth daily.   LORazepam 0.5 MG tablet Commonly known as:  ATIVAN TAKE 1 TABLET BY MOUTH 30 MINUTES PRIOR TO MRI, REPEAT IF NEEDED   losartan 50 MG tablet Commonly known as:  COZAAR   metoprolol succinate 50 MG 24 hr tablet Commonly known as:  TOPROL-XL Take 1 tablet (50 mg total) by mouth daily. Take with or immediately following a meal.   miglitol 50 MG tablet Commonly known as:  GLYSET Take 1 tablet (50 mg total) by mouth 2 (two) times daily.   MYRBETRIQ 25 MG Tb24 tablet Generic drug:  mirabegron ER Take 1 tablet by mouth Daily.   nitroGLYCERIN 0.4 MG SL tablet Commonly known as:  NITROSTAT Place 1 tablet (0.4 mg total) under the tongue  every 5 (five) minutes x 3 doses as needed for chest pain.   ONE TOUCH ULTRA 2 w/Device Kit Use to check blood sugars 1 time per day   pentosan polysulfate 100 MG capsule Commonly known as:  ELMIRON Take 100 mg by mouth 3 (three) times daily before meals.   pravastatin 20 MG tablet Commonly known as:  PRAVACHOL Take 20 mg by mouth daily.   pravastatin 20 MG tablet Commonly known as:  PRAVACHOL TAKE 1 TABLET (20 MG TOTAL) BY MOUTH DAILY.   sitaGLIPtin-metformin 50-500 MG tablet Commonly known as:  JANUMET Take 1 tablet by mouth 2 (two) times daily with a meal.       Allergies:  Allergies  Allergen Reactions  . Ezetimibe Itching  . Atorvastatin     REACTION: rash    Past Medical History:  Diagnosis Date  . Anemia   . 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 Anxiety   . Arthritis   . Bladder irritation   . Deficiency anemia 05/27/2014  . Diabetes mellitus   . GERD (gastroesophageal reflux disease)   . Hypercholesterolemia   . Hypertension   . Hypothyroidism   . Iron deficiency anemia, unspecified   . Psoriasis   . Thyroid disease     Past Surgical History:  Procedure Laterality Date  . ABDOMINAL HYSTERECTOMY     TAH  . BREAST BIOPSY     Left breast biopsy benign lesion  . BREAST BIOPSY  03/11/2012   Procedure: BREAST BIOPSY;  Surgeon: Odis Hollingshead, MD;  Location: [facility];  Service: General;  Laterality: Left;  remove left breast mass  . CARDIAC CATHETERIZATION N/A 01/02/2015   Procedure: Left Heart Cath and Coronary Angiography;  Surgeon: Charolette Forward, MD;  Location: [facility];  Service: Cardiovascular;  Laterality: N/A;  . CATARACT EXTRACTION    . CERVICAL DISC SURGERY    . COLONOSCOPY  2013   neg. next 2023.   Marland Kitchen EYE SURGERY      Family History  Problem Relation Age of Onset  . Heart disease Mother     heart attack  . Stroke Father     brain hemorrhage  . Diabetes Brother   . Cancer Brother   . Breast cancer Sister   . Cancer  Sister     breast cancer    Social History:  reports that she has never smoked. She has never used smokeless tobacco. She reports that she does not drink alcohol or use drugs.  Review of Systems:  HYPERTENSION:  this is well-controlled with losartan along with metoprolol , now taking ER 50 mg daily  RENAL dysfunction and history of hypercalcemia: Resolved  Lab Results  Component Value Date   CREATININE 1.06 01/19/2016   BUN 17 01/19/2016   NA 136 01/19/2016   K 4.9 01/19/2016   CL 102 01/19/2016   CO2 28 01/19/2016     HYPERLIPIDEMIA: The lipid abnormality consists of elevated LDL which is mild and well controlled with pravastatin She thinks she had a rash from Lipitor    Lab Results  Component Value Date   CHOL 150 01/19/2016   HDL 70.60 01/19/2016   LDLCALC 60 01/19/2016   TRIG 97.0 01/19/2016   CHOLHDL 2 01/19/2016    She has a long history of primary HYPOTHYROIDISM which has been treated with 88 g levothyroxine for quite some time She takes this on an empty stomach early in the morning  TSH has been previously stable on current regimen and now slightly low  Lab Results  Component Value Date   TSH 0.32 (L) 01/19/2016   TSH 0.89 07/13/2015   TSH 0.97 12/07/2014   FREET4 1.37 07/13/2015   FREET4 1.69 (H) 12/07/2014   FREET4 2.13 (H) 12/07/2014    NEUROPATHY:  No symptoms of numbness In the feet, is on gabapentin for history of paresthesia   Diabetic foot exam in 4/17: Normal monofilament sensation but absent pulses   History of iron deficiency anemia; followed by hematologist Periodically is getting  Aranesp    Lab Results  Component Value Date   WBC 5.3 01/16/2016   HGB 10.7 (L) 01/16/2016   HCT 35.4 01/16/2016   MCV 81.6 01/16/2016   PLT 269 01/16/2016     OSTEOPENIA: Her T score  done by gynecologist was -2.1, currently on Boniva started in 01/2014     Examination:   BP 120/60   Pulse 85   Wt 112 lb (50.8 kg)   SpO2 96%   BMI 21.16  kg/m   Body mass index is 21.16 kg/m.       ASSESSMENT/ PLAN:   Diabetes type 2, Nonobese   The patient's diabetes control is  recently good with her 3 drug regimen including mealtime Glyset She is checking blood sugars infrequently and mostly after supper Overall blood sugars are excellent with only random high readings around 160 We'll continue the same regimen Reminded her to check blood sugars by rotation at different times  HYPERLIPIDEMIA: Well controlled with pravastatin  HYPOTHYROIDISM: Since TSH is low normal will have her take 75 g daily   Patient Instructions  Reduce Synthroid 88ug to 6 days per week then go to 75 daily  Check blood sugars on waking up  2-3 weekly  Also check blood sugars about 2 hours after a meal and do this after different meals by rotation  Recommended blood sugar levels on waking up is 90-130 and about 2 hours after meal is 130-160  Please bring your blood sugar monitor to each visit, thank you     Scnetx 01/27/2016, 5:51 PM

## 2016-01-30 ENCOUNTER — Ambulatory Visit: Payer: Medicare Other

## 2016-01-30 ENCOUNTER — Other Ambulatory Visit (HOSPITAL_BASED_OUTPATIENT_CLINIC_OR_DEPARTMENT_OTHER): Payer: Medicare Other

## 2016-01-30 ENCOUNTER — Ambulatory Visit (HOSPITAL_BASED_OUTPATIENT_CLINIC_OR_DEPARTMENT_OTHER): Payer: Medicare Other | Admitting: Hematology and Oncology

## 2016-01-30 ENCOUNTER — Telehealth: Payer: Self-pay | Admitting: Hematology and Oncology

## 2016-01-30 DIAGNOSIS — D631 Anemia in chronic kidney disease: Secondary | ICD-10-CM

## 2016-01-30 DIAGNOSIS — N183 Chronic kidney disease, stage 3 unspecified: Secondary | ICD-10-CM

## 2016-01-30 DIAGNOSIS — I10 Essential (primary) hypertension: Secondary | ICD-10-CM

## 2016-01-30 LAB — CBC WITH DIFFERENTIAL/PLATELET
BASO%: 0.2 % (ref 0.0–2.0)
Basophils Absolute: 0 10*3/uL (ref 0.0–0.1)
EOS%: 6 % (ref 0.0–7.0)
Eosinophils Absolute: 0.4 10*3/uL (ref 0.0–0.5)
HCT: 40 % (ref 34.8–46.6)
HGB: 12.1 g/dL (ref 11.6–15.9)
LYMPH%: 31.7 % (ref 14.0–49.7)
MCH: 24.4 pg — ABNORMAL LOW (ref 25.1–34.0)
MCHC: 30.3 g/dL — ABNORMAL LOW (ref 31.5–36.0)
MCV: 80.8 fL (ref 79.5–101.0)
MONO#: 0.5 10*3/uL (ref 0.1–0.9)
MONO%: 8.9 % (ref 0.0–14.0)
NEUT#: 3.1 10*3/uL (ref 1.5–6.5)
NEUT%: 53.2 % (ref 38.4–76.8)
Platelets: 252 10*3/uL (ref 145–400)
RBC: 4.95 10*6/uL (ref 3.70–5.45)
RDW: 15.4 % — ABNORMAL HIGH (ref 11.2–14.5)
WBC: 5.9 10*3/uL (ref 3.9–10.3)
lymph#: 1.9 10*3/uL (ref 0.9–3.3)

## 2016-01-30 NOTE — Telephone Encounter (Signed)
Gave patient avs report and appointments for December thru March.  °

## 2016-01-31 ENCOUNTER — Encounter: Payer: Self-pay | Admitting: Hematology and Oncology

## 2016-01-31 NOTE — Assessment & Plan Note (Signed)
We discussed some of the risks, benefits, and alternatives of erythropoietin stimulating agents such as Procrit or Aranesp.  She tolerated injections well and her hemoglobin is normal. She would not receive her injection today. Moving forward, I will space out her appointment to once a month.

## 2016-01-31 NOTE — Progress Notes (Signed)
[facility] OFFICE PROGRESS NOTE  [name], MD SUMMARY OF HEMATOLOGIC HISTORY:  This is a patient that has been followed here since 2011 for chronic anemia In 2011, she had a bone marrow aspirate and biopsy that came back normal. The patient has been receiving Aranesp injection for chronic anemia up until 2014. It was subsequently discontinued due stability of the anemia. Starting 05/27/2014, darbepoetin was resumed at 200 g every other month to keep hemoglobin greater than 10 g. Treatment was discontinued in October 2016 and resumed in September 2017 INTERVAL HISTORY: Abigail Hoover 70 y.o. female returns for follow-up. She feels well. She denies side effects from her injections. She denies symptoms from headache  I have reviewed the past medical history, past surgical history, social history and family history with the patient and they are unchanged from previous note.  ALLERGIES:  is allergic to ezetimibe and atorvastatin.  MEDICATIONS:  Current Outpatient Prescriptions  Medication Sig Dispense Refill  . aspirin 81 MG tablet Take 81 mg by mouth daily.      . Blood Glucose Monitoring Suppl (ONE TOUCH ULTRA 2) W/DEVICE KIT Use to check blood sugars 1 time per day 1 each 0  . calcium citrate (CALCITRATE - DOSED IN MG ELEMENTAL CALCIUM) 950 MG tablet Take 200 mg of elemental calcium by mouth every other day. 1/2 tab    . CARAFATE 1 GM/10ML suspension     . dexlansoprazole (DEXILANT) 60 MG capsule Take 60 mg by mouth daily.      . furosemide (LASIX) 20 MG tablet Take 20 mg by mouth daily.    Marland Kitchen gabapentin (NEURONTIN) 300 MG capsule Take 300 mg by mouth 4 (four) times daily.    Marland Kitchen ibandronate (BONIVA) 150 MG tablet TAKE 1 TAB ONCE A MONTH IN THE AM ON AN EMPTY STOMACH WITH FULL GLAS OF WATER STAY UPRIGHT 30 MIN 1 tablet 5  . isosorbide mononitrate (IMDUR) 60 MG 24 hr tablet Take 1 tablet (60 mg total) by mouth daily. 30 tablet 3  . losartan (COZAAR) 50 MG tablet     .  metoprolol succinate (TOPROL-XL) 50 MG 24 hr tablet Take 1 tablet (50 mg total) by mouth daily. Take with or immediately following a meal. (Patient taking differently: Take 25 mg by mouth daily. Take with or immediately following a meal.) 30 tablet 3  . miglitol (GLYSET) 50 MG tablet Take 1 tablet (50 mg total) by mouth 2 (two) times daily. 180 tablet 0  . MYRBETRIQ 25 MG TB24 Take 1 tablet by mouth Daily.    . pentosan polysulfate (ELMIRON) 100 MG capsule Take 100 mg by mouth 3 (three) times daily before meals.     . pravastatin (PRAVACHOL) 20 MG tablet Take 20 mg by mouth daily.    . sitaGLIPtin-metformin (JANUMET) 50-500 MG tablet Take 1 tablet by mouth 2 (two) times daily with a meal. 180 tablet 0  . LORazepam (ATIVAN) 0.5 MG tablet TAKE 1 TABLET BY MOUTH 30 MINUTES PRIOR TO MRI, REPEAT IF NEEDED  0  . nitroGLYCERIN (NITROSTAT) 0.4 MG SL tablet Place 1 tablet (0.4 mg total) under the tongue every 5 (five) minutes x 3 doses as needed for chest pain. (Patient not taking: Reported on 01/30/2016) 25 tablet 12   No current facility-administered medications for this visit.      REVIEW OF SYSTEMS:   Constitutional: Denies fevers, chills or night sweats Eyes: Denies blurriness of vision Ears, nose, mouth, throat, and face: Denies mucositis or sore  throat Respiratory: Denies cough, dyspnea or wheezes Cardiovascular: Denies palpitation, chest discomfort or lower extremity swelling Gastrointestinal:  Denies nausea, heartburn or change in bowel habits Skin: Denies abnormal skin rashes Lymphatics: Denies new lymphadenopathy or easy bruising Neurological:Denies numbness, tingling or new weaknesses Behavioral/Psych: Mood is stable, no new changes  All other systems were reviewed with the patient and are negative.  PHYSICAL EXAMINATION: ECOG PERFORMANCE STATUS: 0 - Asymptomatic  Vitals:   01/30/16 1417  BP: (!) 181/61  Pulse: 63  Resp: 18  Temp: 97.5 F (36.4 C)   Filed Weights   01/30/16  1417  Weight: 115 lb 1.6 oz (52.2 kg)    GENERAL:alert, no distress and comfortable SKIN: skin color, texture, turgor are normal, no rashes or significant lesions EYES: normal, Conjunctiva are pink and non-injected, sclera clear Musculoskeletal:no cyanosis of digits and no clubbing  NEURO: alert & oriented x 3 with fluent speech, no focal motor/sensory deficits  LABORATORY DATA:  I have reviewed the data as listed     Component Value Date/Time   NA 136 01/19/2016 1141   NA 139 12/12/2015 1436   K 4.9 01/19/2016 1141   K 4.6 12/12/2015 1436   CL 102 01/19/2016 1141   CL 103 07/27/2012 1259   CO2 28 01/19/2016 1141   CO2 26 12/12/2015 1436   GLUCOSE 187 (H) 01/19/2016 1141   GLUCOSE 120 12/12/2015 1436   GLUCOSE 118 (H) 07/27/2012 1259   BUN 17 01/19/2016 1141   BUN 15.7 12/12/2015 1436   CREATININE 1.06 01/19/2016 1141   CREATININE 1.2 (H) 12/12/2015 1436   CALCIUM 10.0 01/19/2016 1141   CALCIUM 9.8 12/12/2015 1436   PROT 6.2 (L) 12/12/2015 1436   ALBUMIN 3.4 (L) 12/12/2015 1436   AST 18 12/12/2015 1436   ALT 15 12/12/2015 1436   ALKPHOS 74 12/12/2015 1436   BILITOT <0.30 12/12/2015 1436   GFRNONAA 56 (L) 01/04/2015 0430   GFRAA >60 01/04/2015 0430    No results found for: SPEP, UPEP  Lab Results  Component Value Date   WBC 5.9 01/30/2016   NEUTROABS 3.1 01/30/2016   HGB 12.1 01/30/2016   HCT 40.0 01/30/2016   MCV 80.8 01/30/2016   PLT 252 01/30/2016      Chemistry      Component Value Date/Time   NA 136 01/19/2016 1141   NA 139 12/12/2015 1436   K 4.9 01/19/2016 1141   K 4.6 12/12/2015 1436   CL 102 01/19/2016 1141   CL 103 07/27/2012 1259   CO2 28 01/19/2016 1141   CO2 26 12/12/2015 1436   BUN 17 01/19/2016 1141   BUN 15.7 12/12/2015 1436   CREATININE 1.06 01/19/2016 1141   CREATININE 1.2 (H) 12/12/2015 1436      Component Value Date/Time   CALCIUM 10.0 01/19/2016 1141   CALCIUM 9.8 12/12/2015 1436   ALKPHOS 74 12/12/2015 1436   AST 18  12/12/2015 1436   ALT 15 12/12/2015 1436   BILITOT <0.30 12/12/2015 1436      ASSESSMENT & PLAN:  Anemia in chronic kidney disease We discussed some of the risks, benefits, and alternatives of erythropoietin stimulating agents such as Procrit or Aranesp.  She tolerated injections well and her hemoglobin is normal. She would not receive her injection today. Moving forward, I will space out her appointment to once a month.   Essential hypertension Her blood pressure is a little high today. she will continue current medical management. I recommend close follow-up with primary care doctor  for medication adjustment.     No orders of the defined types were placed in this encounter.   All questions were answered. The patient knows to call the clinic with any problems, questions or concerns. No barriers to learning was detected.  I spent 10 minutes counseling the patient face to face. The total time spent in the appointment was 15 minutes and more than 50% was on counseling.     [name], MD 11/8/20178:40 AM

## 2016-01-31 NOTE — Assessment & Plan Note (Signed)
Her blood pressure is a little high today. she will continue current medical management. I recommend close follow-up with primary care doctor for medication adjustment.

## 2016-02-04 ENCOUNTER — Other Ambulatory Visit: Payer: Self-pay | Admitting: Endocrinology

## 2016-02-08 ENCOUNTER — Other Ambulatory Visit: Payer: Medicare Other

## 2016-02-08 ENCOUNTER — Ambulatory Visit: Payer: Medicare Other | Admitting: Hematology and Oncology

## 2016-02-09 DIAGNOSIS — E871 Hypo-osmolality and hyponatremia: Secondary | ICD-10-CM | POA: Diagnosis not present

## 2016-02-09 DIAGNOSIS — N390 Urinary tract infection, site not specified: Secondary | ICD-10-CM | POA: Diagnosis not present

## 2016-02-12 ENCOUNTER — Other Ambulatory Visit: Payer: Self-pay | Admitting: Nephrology

## 2016-02-12 DIAGNOSIS — E871 Hypo-osmolality and hyponatremia: Secondary | ICD-10-CM

## 2016-02-13 ENCOUNTER — Ambulatory Visit
Admission: RE | Admit: 2016-02-13 | Discharge: 2016-02-13 | Disposition: A | Discharge status: Home / Self Care | Payer: Medicare Other | Source: Ambulatory Visit | Attending: Nephrology | Admitting: Nephrology

## 2016-02-13 DIAGNOSIS — N2889 Other specified disorders of kidney and ureter: Secondary | ICD-10-CM | POA: Diagnosis not present

## 2016-02-13 DIAGNOSIS — E871 Hypo-osmolality and hyponatremia: Secondary | ICD-10-CM

## 2016-02-16 ENCOUNTER — Other Ambulatory Visit: Payer: Self-pay | Admitting: Endocrinology

## 2016-02-19 ENCOUNTER — Telehealth: Payer: Self-pay | Admitting: Endocrinology

## 2016-02-19 NOTE — Telephone Encounter (Signed)
3 mon supply of glyset call to [facility] please   The one called in over the weekend did not go thru I guess

## 2016-02-21 DIAGNOSIS — Y999 Unspecified external cause status: Secondary | ICD-10-CM | POA: Diagnosis not present

## 2016-02-21 DIAGNOSIS — G8918 Other acute postprocedural pain: Secondary | ICD-10-CM | POA: Diagnosis not present

## 2016-02-21 DIAGNOSIS — M11261 Other chondrocalcinosis, right knee: Secondary | ICD-10-CM | POA: Diagnosis not present

## 2016-02-21 DIAGNOSIS — M23300 Other meniscus derangements, unspecified lateral meniscus, right knee: Secondary | ICD-10-CM | POA: Diagnosis not present

## 2016-02-21 DIAGNOSIS — M23331 Other meniscus derangements, other medial meniscus, right knee: Secondary | ICD-10-CM | POA: Diagnosis not present

## 2016-02-21 DIAGNOSIS — S83231A Complex tear of medial meniscus, current injury, right knee, initial encounter: Secondary | ICD-10-CM | POA: Diagnosis not present

## 2016-02-21 DIAGNOSIS — M1711 Unilateral primary osteoarthritis, right knee: Secondary | ICD-10-CM | POA: Diagnosis not present

## 2016-02-21 DIAGNOSIS — S83271A Complex tear of lateral meniscus, current injury, right knee, initial encounter: Secondary | ICD-10-CM | POA: Diagnosis not present

## 2016-02-24 ENCOUNTER — Other Ambulatory Visit: Payer: Self-pay | Admitting: Endocrinology

## 2016-02-26 MED ORDER — GLYSET 50 MG PO TABS: 50.0000 mg | ORAL_TABLET | Freq: Two times a day (BID) | ORAL | 0 refills | Status: DC

## 2016-02-26 NOTE — Telephone Encounter (Signed)
Pt is in need of the brand name of the glyset please the pharmacy distributed her the generic

## 2016-02-26 NOTE — Addendum Note (Signed)
Addended by: Pilar Grammes on: 02/26/2016 02:14 PM   Modules accepted: Orders

## 2016-02-27 ENCOUNTER — Ambulatory Visit: Payer: Medicare Other

## 2016-02-27 ENCOUNTER — Other Ambulatory Visit: Payer: Medicare Other

## 2016-02-29 ENCOUNTER — Emergency Department (HOSPITAL_COMMUNITY): Payer: Medicare Other

## 2016-02-29 ENCOUNTER — Inpatient Hospital Stay (HOSPITAL_COMMUNITY)
Admission: EM | Admit: 2016-02-29 | Discharge: 2016-03-05 | DRG: 470 | Disposition: A | Discharge status: Skilled Nursing Facility | Payer: Medicare Other | Attending: Internal Medicine | Admitting: Internal Medicine

## 2016-02-29 ENCOUNTER — Encounter (HOSPITAL_COMMUNITY): Payer: Self-pay | Admitting: Emergency Medicine

## 2016-02-29 DIAGNOSIS — Y92009 Unspecified place in unspecified non-institutional (private) residence as the place of occurrence of the external cause: Secondary | ICD-10-CM

## 2016-02-29 DIAGNOSIS — I10 Essential (primary) hypertension: Secondary | ICD-10-CM | POA: Diagnosis not present

## 2016-02-29 DIAGNOSIS — K219 Gastro-esophageal reflux disease without esophagitis: Secondary | ICD-10-CM | POA: Diagnosis present

## 2016-02-29 DIAGNOSIS — D62 Acute posthemorrhagic anemia: Secondary | ICD-10-CM | POA: Diagnosis not present

## 2016-02-29 DIAGNOSIS — W19XXXA Unspecified fall, initial encounter: Secondary | ICD-10-CM | POA: Diagnosis not present

## 2016-02-29 DIAGNOSIS — E1122 Type 2 diabetes mellitus with diabetic chronic kidney disease: Secondary | ICD-10-CM | POA: Diagnosis not present

## 2016-02-29 DIAGNOSIS — M25522 Pain in left elbow: Secondary | ICD-10-CM | POA: Diagnosis not present

## 2016-02-29 DIAGNOSIS — F419 Anxiety disorder, unspecified: Secondary | ICD-10-CM | POA: Diagnosis present

## 2016-02-29 DIAGNOSIS — M25562 Pain in left knee: Secondary | ICD-10-CM | POA: Diagnosis not present

## 2016-02-29 DIAGNOSIS — E875 Hyperkalemia: Secondary | ICD-10-CM | POA: Diagnosis present

## 2016-02-29 DIAGNOSIS — S72002A Fracture of unspecified part of neck of left femur, initial encounter for closed fracture: Secondary | ICD-10-CM

## 2016-02-29 DIAGNOSIS — Z833 Family history of diabetes mellitus: Secondary | ICD-10-CM

## 2016-02-29 DIAGNOSIS — N183 Chronic kidney disease, stage 3 unspecified: Secondary | ICD-10-CM | POA: Diagnosis present

## 2016-02-29 DIAGNOSIS — Z7982 Long term (current) use of aspirin: Secondary | ICD-10-CM

## 2016-02-29 DIAGNOSIS — Z8249 Family history of ischemic heart disease and other diseases of the circulatory system: Secondary | ICD-10-CM

## 2016-02-29 DIAGNOSIS — Z823 Family history of stroke: Secondary | ICD-10-CM

## 2016-02-29 DIAGNOSIS — E860 Dehydration: Secondary | ICD-10-CM | POA: Diagnosis present

## 2016-02-29 DIAGNOSIS — I129 Hypertensive chronic kidney disease with stage 1 through stage 4 chronic kidney disease, or unspecified chronic kidney disease: Secondary | ICD-10-CM | POA: Diagnosis present

## 2016-02-29 DIAGNOSIS — Z09 Encounter for follow-up examination after completed treatment for conditions other than malignant neoplasm: Secondary | ICD-10-CM

## 2016-02-29 DIAGNOSIS — Z888 Allergy status to other drugs, medicaments and biological substances status: Secondary | ICD-10-CM

## 2016-02-29 DIAGNOSIS — R262 Difficulty in walking, not elsewhere classified: Secondary | ICD-10-CM

## 2016-02-29 DIAGNOSIS — N179 Acute kidney failure, unspecified: Secondary | ICD-10-CM | POA: Diagnosis present

## 2016-02-29 DIAGNOSIS — Z419 Encounter for procedure for purposes other than remedying health state, unspecified: Secondary | ICD-10-CM

## 2016-02-29 DIAGNOSIS — S8992XA Unspecified injury of left lower leg, initial encounter: Secondary | ICD-10-CM | POA: Diagnosis not present

## 2016-02-29 DIAGNOSIS — Z90710 Acquired absence of both cervix and uterus: Secondary | ICD-10-CM

## 2016-02-29 DIAGNOSIS — E039 Hypothyroidism, unspecified: Secondary | ICD-10-CM | POA: Diagnosis present

## 2016-02-29 DIAGNOSIS — W1839XA Other fall on same level, initial encounter: Secondary | ICD-10-CM | POA: Diagnosis present

## 2016-02-29 DIAGNOSIS — S72042A Displaced fracture of base of neck of left femur, initial encounter for closed fracture: Secondary | ICD-10-CM | POA: Diagnosis not present

## 2016-02-29 DIAGNOSIS — Z4801 Encounter for change or removal of surgical wound dressing: Secondary | ICD-10-CM | POA: Diagnosis not present

## 2016-02-29 DIAGNOSIS — D631 Anemia in chronic kidney disease: Secondary | ICD-10-CM | POA: Diagnosis present

## 2016-02-29 DIAGNOSIS — Z7984 Long term (current) use of oral hypoglycemic drugs: Secondary | ICD-10-CM

## 2016-02-29 DIAGNOSIS — T148XXA Other injury of unspecified body region, initial encounter: Secondary | ICD-10-CM | POA: Diagnosis not present

## 2016-02-29 DIAGNOSIS — E7800 Pure hypercholesterolemia, unspecified: Secondary | ICD-10-CM | POA: Diagnosis present

## 2016-02-29 DIAGNOSIS — M25552 Pain in left hip: Secondary | ICD-10-CM | POA: Diagnosis not present

## 2016-02-29 DIAGNOSIS — Z79899 Other long term (current) drug therapy: Secondary | ICD-10-CM

## 2016-02-29 DIAGNOSIS — S72009A Fracture of unspecified part of neck of unspecified femur, initial encounter for closed fracture: Secondary | ICD-10-CM | POA: Diagnosis present

## 2016-02-29 LAB — CBC WITH DIFFERENTIAL/PLATELET
Basophils Absolute: 0 10*3/uL (ref 0.0–0.1)
Basophils Relative: 0 %
Eosinophils Absolute: 0.3 10*3/uL (ref 0.0–0.7)
Eosinophils Relative: 3 %
HCT: 40.4 % (ref 36.0–46.0)
Hemoglobin: 12.3 g/dL (ref 12.0–15.0)
Lymphocytes Relative: 18 %
Lymphs Abs: 1.9 10*3/uL (ref 0.7–4.0)
MCH: 24.4 pg — ABNORMAL LOW (ref 26.0–34.0)
MCHC: 30.4 g/dL (ref 30.0–36.0)
MCV: 80.2 fL (ref 78.0–100.0)
Monocytes Absolute: 0.4 10*3/uL (ref 0.1–1.0)
Monocytes Relative: 4 %
Neutro Abs: 7.6 10*3/uL (ref 1.7–7.7)
Neutrophils Relative %: 75 %
Platelets: 216 10*3/uL (ref 150–400)
RBC: 5.04 MIL/uL (ref 3.87–5.11)
RDW: 16.4 % — ABNORMAL HIGH (ref 11.5–15.5)
WBC: 10.1 10*3/uL (ref 4.0–10.5)

## 2016-02-29 LAB — TYPE AND SCREEN
ABO/RH(D): A POS
Antibody Screen: NEGATIVE

## 2016-02-29 LAB — BASIC METABOLIC PANEL
Anion gap: 9 (ref 5–15)
BUN: 34 mg/dL — ABNORMAL HIGH (ref 6–20)
CO2: 24 mmol/L (ref 22–32)
Calcium: 9.5 mg/dL (ref 8.9–10.3)
Chloride: 104 mmol/L (ref 101–111)
Creatinine, Ser: 1.27 mg/dL — ABNORMAL HIGH (ref 0.44–1.00)
GFR calc Af Amer: 48 mL/min — ABNORMAL LOW (ref >60)
GFR calc non Af Amer: 41 mL/min — ABNORMAL LOW (ref >60)
Glucose, Bld: 165 mg/dL — ABNORMAL HIGH (ref 65–99)
Potassium: 5.8 mmol/L — ABNORMAL HIGH (ref 3.5–5.1)
Sodium: 137 mmol/L (ref 135–145)

## 2016-02-29 LAB — PROTIME-INR
INR: 0.94
Prothrombin Time: 12.6 seconds (ref 11.4–15.2)

## 2016-02-29 LAB — ABO/RH: ABO/RH(D): A POS

## 2016-02-29 LAB — TROPONIN I: Troponin I: <0.03 ng/mL (ref <0.03)

## 2016-02-29 MED ORDER — SODIUM CHLORIDE 0.9 % IV SOLN
INTRAVENOUS | Status: DC
  Administered 2016-02-29: 20:00:00 via INTRAVENOUS

## 2016-02-29 MED ORDER — HYDROCODONE-ACETAMINOPHEN 5-325 MG PO TABS
1.0000 | ORAL_TABLET | Freq: Four times a day (QID) | ORAL | Status: DC | PRN
  Administered 2016-03-01 (×3): 2 via ORAL
  Filled 2016-02-29 (×3): qty 2

## 2016-02-29 MED ORDER — ISOSORBIDE MONONITRATE ER 60 MG PO TB24
60.0000 mg | ORAL_TABLET | Freq: Every day | ORAL | Status: DC
  Administered 2016-03-01 – 2016-03-05 (×4): 60 mg via ORAL
  Filled 2016-02-29 (×4): qty 1

## 2016-02-29 MED ORDER — PENTOSAN POLYSULFATE SODIUM 100 MG PO CAPS
100.0000 mg | ORAL_CAPSULE | Freq: Three times a day (TID) | ORAL | Status: DC
  Administered 2016-03-01 – 2016-03-05 (×13): 100 mg via ORAL
  Filled 2016-02-29 (×16): qty 1

## 2016-02-29 MED ORDER — FENTANYL CITRATE (PF) 100 MCG/2ML IJ SOLN
100.0000 ug | Freq: Once | INTRAMUSCULAR | Status: AC
  Administered 2016-02-29: 100 ug via INTRAVENOUS
  Filled 2016-02-29: qty 2

## 2016-02-29 MED ORDER — METOPROLOL SUCCINATE ER 25 MG PO TB24
25.0000 mg | ORAL_TABLET | Freq: Every day | ORAL | Status: DC
  Administered 2016-03-01 – 2016-03-05 (×5): 25 mg via ORAL
  Filled 2016-02-29 (×5): qty 1

## 2016-02-29 MED ORDER — LEVOTHYROXINE SODIUM 75 MCG PO TABS
75.0000 ug | ORAL_TABLET | Freq: Every day | ORAL | Status: DC
  Administered 2016-03-01 – 2016-03-05 (×5): 75 ug via ORAL
  Filled 2016-02-29 (×5): qty 1

## 2016-02-29 MED ORDER — MIRABEGRON ER 25 MG PO TB24
50.0000 mg | ORAL_TABLET | Freq: Every day | ORAL | Status: DC
Start: 2016-03-01 — End: 2016-03-05
  Administered 2016-03-01 – 2016-03-05 (×4): 50 mg via ORAL
  Filled 2016-02-29 (×4): qty 2

## 2016-02-29 MED ORDER — GABAPENTIN 300 MG PO CAPS
300.0000 mg | ORAL_CAPSULE | Freq: Four times a day (QID) | ORAL | Status: DC
  Administered 2016-03-01 – 2016-03-05 (×17): 300 mg via ORAL
  Filled 2016-02-29 (×17): qty 1

## 2016-02-29 MED ORDER — ONDANSETRON HCL 4 MG/2ML IJ SOLN
4.0000 mg | Freq: Three times a day (TID) | INTRAMUSCULAR | Status: AC | PRN
  Administered 2016-03-01: 4 mg via INTRAVENOUS
  Filled 2016-02-29: qty 2

## 2016-02-29 MED ORDER — HYDROMORPHONE HCL 1 MG/ML IJ SOLN
1.0000 mg | INTRAMUSCULAR | Status: DC | PRN
  Administered 2016-03-01: 1 mg via INTRAVENOUS
  Filled 2016-02-29: qty 1

## 2016-02-29 MED ORDER — LOSARTAN POTASSIUM 50 MG PO TABS: 50.0000 mg | ORAL_TABLET | Freq: Every day | ORAL | Status: DC

## 2016-02-29 MED ORDER — ENOXAPARIN SODIUM 30 MG/0.3ML ~~LOC~~ SOLN: 30.0000 mg | SUBCUTANEOUS | Status: DC

## 2016-02-29 MED ORDER — NITROGLYCERIN 0.4 MG SL SUBL: 0.4000 mg | SUBLINGUAL_TABLET | SUBLINGUAL | Status: DC | PRN

## 2016-02-29 MED ORDER — ASPIRIN EC 81 MG PO TBEC
81.0000 mg | DELAYED_RELEASE_TABLET | Freq: Every day | ORAL | Status: DC
  Administered 2016-03-01: 81 mg via ORAL
  Filled 2016-02-29: qty 1

## 2016-02-29 NOTE — ED Provider Notes (Signed)
[facility] DEPT Provider Note   CSN: [id number] Arrival date & time: 02/29/16  1956     History   Chief Complaint Chief Complaint  Patient presents with  . Hip Pain    HPI Abigail Hoover is a 70 y.o. female.  Pt presents to the ED today with left hip pain.  She had surgery by Dr. Veverly Fells on her right knee last week, so has been a little unsteady.  She fell today and landed on her left hip.  The pt was unable to get up.  EMS gave her 200 mcg of fentanyl en route and pain is gone.      Past Medical History:  Diagnosis Date  . Anemia   . 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 Anxiety   . Arthritis   . Bladder irritation   . Deficiency anemia 05/27/2014  . Diabetes mellitus   . GERD (gastroesophageal reflux disease)   . Hypercholesterolemia   . Hypertension   . Hypothyroidism   . Iron deficiency anemia, unspecified   . Psoriasis   . Thyroid disease     Patient Active Problem List   Diagnosis Date Noted  . Femoral neck fracture ([place]) 02/29/2016  . Anemia due to stage 3 (moderate) chronic renal failure treated with erythropoietin 12/18/2015  . Chronic kidney disease, stage III (moderate) 08/14/2015  . 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 Acute coronary syndrome ([place]) 01/02/2015  . Deficiency anemia 05/27/2014  . Carpal tunnel syndrome 12/09/2013  . Diabetic neuropathy ([place]) 12/09/2013  . Type II or unspecified type diabetes mellitus with neurological manifestations, not stated as uncontrolled(250.60) 04/21/2013  . Disturbance of skin sensation 04/21/2013  . Type II or unspecified type diabetes mellitus without mention of complication, uncontrolled 10/30/2012  . Unspecified hypothyroidism 10/30/2012  . Breast mass in female-left 03/10/2012  . Weight loss 11/28/2011  . Anemia, chronic disease 08/02/2010  . Family history of breast cancer 08/01/2010  . Essential hypertension 02/01/2009  . EDEMA LEGS 02/01/2009    Past Surgical History:    Procedure Laterality Date  . ABDOMINAL HYSTERECTOMY     TAH  . BREAST BIOPSY     Left breast biopsy benign lesion  . BREAST BIOPSY  03/11/2012   Procedure: BREAST BIOPSY;  Surgeon: Odis Hollingshead, MD;  Location: [facility];  Service: General;  Laterality: Left;  remove left breast mass  . CARDIAC CATHETERIZATION N/A 01/02/2015   Procedure: Left Heart Cath and Coronary Angiography;  Surgeon: Charolette Forward, MD;  Location: [facility];  Service: Cardiovascular;  Laterality: N/A;  . CATARACT EXTRACTION    . CERVICAL DISC SURGERY    . COLONOSCOPY  2013   neg. next 2023.   Marland Kitchen EYE SURGERY      OB History    No data available       Home Medications    Prior to Admission medications   Medication Sig Start Date End Date Taking? Authorizing Provider  aspirin 81 MG tablet Take 81 mg by mouth daily.     Yes Historical Provider, MD  Blood Glucose Monitoring Suppl (ONE TOUCH ULTRA 2) W/DEVICE KIT Use to check blood sugars 1 time per day 03/08/13  Yes Elayne Snare, MD  calcium citrate (CALCITRATE - DOSED IN MG ELEMENTAL CALCIUM) 950 MG tablet Take 200 mg of elemental calcium by mouth daily. 1/2 tab   Yes Historical Provider, MD  gabapentin (NEURONTIN) 300 MG capsule Take 300 mg by mouth 4 (four) times daily.  Yes Historical Provider, MD  GLYSET 50 MG tablet Take 1 tablet (50 mg total) by mouth 2 (two) times daily. Must Make Office Visit!!! Patient taking differently: Take 50-100 mg by mouth 2 (two) times daily. 50 mg qam and 100 mg qhs 02/26/16  Yes Elayne Snare, MD  ibandronate (BONIVA) 150 MG tablet TAKE 1 TAB ONCE A MONTH IN THE AM ON AN EMPTY STOMACH WITH FULL GLAS OF WATER STAY UPRIGHT 30 MIN 11/08/14  Yes Elayne Snare, MD  isosorbide mononitrate (IMDUR) 60 MG 24 hr tablet Take 1 tablet (60 mg total) by mouth daily. 01/04/15  Yes Charolette Forward, MD  levothyroxine (SYNTHROID, LEVOTHROID) 75 MCG tablet Take 1 tablet (75 mcg total) by mouth daily. 02/04/16  Yes Elayne Snare, MD  losartan (COZAAR) 50  MG tablet Take 50 mg by mouth daily.  10/28/15  Yes Historical Provider, MD  metoprolol succinate (TOPROL-XL) 50 MG 24 hr tablet Take 1 tablet (50 mg total) by mouth daily. Take with or immediately following a meal. Patient taking differently: Take 25 mg by mouth daily. Take with or immediately following a meal. 01/04/15  Yes Charolette Forward, MD  MYRBETRIQ 50 MG TB24 tablet Take 50 mg by mouth daily. 12/21/15  Yes Historical Provider, MD  nitroGLYCERIN (NITROSTAT) 0.4 MG SL tablet Place 1 tablet (0.4 mg total) under the tongue every 5 (five) minutes x 3 doses as needed for chest pain. 01/04/15  Yes Charolette Forward, MD  pentosan polysulfate (ELMIRON) 100 MG capsule Take 100 mg by mouth 3 (three) times daily before meals.    Yes Historical Provider, MD  sitaGLIPtin-metformin (JANUMET) 50-500 MG tablet Take 1 tablet by mouth 2 (two) times daily with a meal. 01/25/16  Yes Elayne Snare, MD  GLYSET 50 MG tablet TAKE 1 TABLET BY MOUTH 3 TIMES A DAY *NEED OFFICE VISIT Patient not taking: Reported on 02/29/2016 02/18/16   Elayne Snare, MD    Family History Family History  Problem Relation Age of Onset  . Heart disease Mother     heart attack  . Stroke Father     brain hemorrhage  . Diabetes Brother   . Cancer Brother   . Breast cancer Sister   . Cancer Sister     breast cancer    Social History Social History  Substance Use Topics  . Smoking status: Never Smoker  . Smokeless tobacco: Never Used  . Alcohol use No     Allergies   Ezetimibe and Atorvastatin   Review of Systems Review of Systems  Musculoskeletal:       Left hip pain  All other systems reviewed and are negative.    Physical Exam Updated Vital Signs BP 168/60 (BP Location: Right Arm)   Pulse 70   Temp 98 F (36.7 C) (Oral)   Resp 16   Ht _0  (1.549 m)   Wt 113 lb (51.3 kg)   SpO2 100%   BMI 21.35 kg/m   Physical Exam  Constitutional: She is oriented to person, place, and time. She appears well-developed and  well-nourished.  HENT:  Head: Normocephalic and atraumatic.  Right Ear: External ear normal.  Left Ear: External ear normal.  Nose: Nose normal.  Mouth/Throat: Oropharynx is clear and moist.  Eyes: Conjunctivae and EOM are normal. Pupils are equal, round, and reactive to light.  Neck: Normal range of motion. Neck supple.  Cardiovascular: Normal rate, regular rhythm, normal heart sounds and intact distal pulses.   Pulmonary/Chest: Effort normal and breath sounds normal.  Abdominal: Soft. Bowel sounds are normal.  Musculoskeletal:       Left hip: She exhibits bony tenderness and deformity.  Neurological: She is alert and oriented to person, place, and time.  Skin: Skin is warm.  Psychiatric: She has a normal mood and affect. Her behavior is normal. Judgment and thought content normal.  Nursing note and vitals reviewed.    ED Treatments / Results  Labs (all labs ordered are listed, but only abnormal results are displayed) Labs Reviewed  BASIC METABOLIC PANEL - Abnormal; Notable for the following:       Result Value   Potassium 5.8 (*)    Glucose, Bld 165 (*)    BUN 34 (*)    Creatinine, Ser 1.27 (*)    GFR calc non Af Amer 41 (*)    GFR calc Af Amer 48 (*)    All other components within normal limits  CBC WITH DIFFERENTIAL/PLATELET - Abnormal; Notable for the following:    MCH 24.4 (*)    RDW 16.4 (*)    All other components within normal limits  PROTIME-INR  URINALYSIS, ROUTINE W REFLEX MICROSCOPIC  TROPONIN I  BASIC METABOLIC PANEL  TYPE AND SCREEN  ABO/RH    EKG  EKG Interpretation  Date/Time:  Thursday February 29 2016 22:28:40 EST Ventricular Rate:  79 PR Interval:    QRS Duration: 98 QT Interval:  371 QTC Calculation: 426 R Axis:   59 Text Interpretation:  Sinus rhythm Confirmed by Albie Arizpe MD, Dalin Caldera ([id number]) on 02/29/2016 10:34:42 PM       Radiology Dg Hip Unilat With Pelvis 2-3 Views Left  Result Date: 02/29/2016 CLINICAL DATA:  Left hip pain after  fall today. EXAM: DG HIP (WITH OR WITHOUT PELVIS) 2-3V LEFT COMPARISON:  None. FINDINGS: Displaced left femoral neck fracture with angulation. No significant shortening. Femoral head remains seated in the acetabulum. Pubic rami are intact. No additional acute fracture. IMPRESSION: Displaced left femoral neck fracture with angulation. Electronically Signed   By: Jeb Levering M.D.   On: 02/29/2016 21:53    Procedures Procedures (including critical care time)  Medications Ordered in ED Medications  0.9 %  sodium chloride infusion ( Intravenous New Bag/Given 02/29/16 2021)  fentaNYL (SUBLIMAZE) injection 100 mcg (100 mcg Intravenous Given 02/29/16 2147)     Initial Impression / Assessment and Plan / ED Course  I have reviewed the triage vital signs and the nursing notes.  Pertinent labs & imaging results that were available during my care of the patient were reviewed by me and considered in my medical decision making (see chart for details).  Clinical Course    Pt's family requests [facility] as Dr. Veverly Fells just operated on her.  Pt d/w Dr. Doran Durand (on call for [facility]) who will consult.  He checked on OR time at [facility] and [facility].  There is no OR time here, so he said to transfer to [facility].  Dr. Lyla Glassing will fix her hip tomorrow.  Dr. Tamala Julian ([facility]) will admit her.  Final Clinical Impressions(s) / ED Diagnoses   Final diagnoses:  Displaced fracture of left femoral neck (HCC)    New Prescriptions New Prescriptions   No medications on file     Isla Pence, MD 02/29/16 2238

## 2016-02-29 NOTE — ED Notes (Signed)
Bed: WA21 Expected date:  Expected time:  Means of arrival:  Comments: EMS fall hip fracture

## 2016-02-29 NOTE — ED Notes (Signed)
Attempted Lab draw x 2 but unsuccessful.

## 2016-02-29 NOTE — ED Notes (Signed)
Attempted blood draw: Phlebotomy x2, Floy Sabina, RN attempted off IV. Unsuccessful.

## 2016-02-29 NOTE — ED Triage Notes (Signed)
Pt from home by EMS c/o hip pain onset this evening after fall. Notable shortening to [place].  Fetenyl 254mcg given by EMS.

## 2016-02-29 NOTE — H&P (Signed)
History and Physical    Abigail Hoover JTT:[id number] DOB: [birth year] DOA: 02/29/2016  Referring MD/NP/PA: Dr. Gilford Raid PCP: [name], MD  Patient coming from: Home via EMS  Chief Complaint:  Fall  HPI: Abigail Hoover is a 70 y.o. female with medical history significant of HTN, HLD, DM type 2, and anemia;  who presents after having a fall at home complaining of left hip pain. Tonight the patient had been trying to sit at the edge of the bed, but had missed the bed onto her left hip. Patient noted acute left hip pain for which she was unable to stand or bear weight on the affected leg. Rated pain as a 8 out of 10 on the pain scale. 8 days ago the patient had undergone right arthroscopic knee surgery by Dr. Veverly Fells and had been at home recovering well per family prior to the event tonight.   ED Course: En route with EMS the patient was given 200 mcg  of fentanyl. Upon admission into the emergency department x-rays revealed a left femoral neck fracture. She was given additional 100 mg of fentanyl for pain. Dr. Doran Durand of orthopedics was consulted and recommended transfer to [facility] for possible surgical correction tomorrow.  Review of Systems: As per HPI otherwise 10 point review of systems negative.   Past Medical History:  Diagnosis Date  . Anemia   . 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 Anxiety   . Arthritis   . Bladder irritation   . Deficiency anemia 05/27/2014  . Diabetes mellitus   . GERD (gastroesophageal reflux disease)   . Hypercholesterolemia   . Hypertension   . Hypothyroidism   . Iron deficiency anemia, unspecified   . Psoriasis   . Thyroid disease     Past Surgical History:  Procedure Laterality Date  . ABDOMINAL HYSTERECTOMY     TAH  . BREAST BIOPSY     Left breast biopsy benign lesion  . BREAST BIOPSY  03/11/2012   Procedure: BREAST BIOPSY;  Surgeon: Odis Hollingshead, MD;  Location: [facility];  Service: General;  Laterality: Left;  remove left breast  mass  . CARDIAC CATHETERIZATION N/A 01/02/2015   Procedure: Left Heart Cath and Coronary Angiography;  Surgeon: Charolette Forward, MD;  Location: [facility];  Service: Cardiovascular;  Laterality: N/A;  . CATARACT EXTRACTION    . CERVICAL DISC SURGERY    . COLONOSCOPY  2013   neg. next 2023.   Marland Kitchen EYE SURGERY       reports that she has never smoked. She has never used smokeless tobacco. She reports that she does not drink alcohol or use drugs.  Allergies  Allergen Reactions  . Ezetimibe Itching  . Atorvastatin     REACTION: rash    Family History  Problem Relation Age of Onset  . Heart disease Mother     heart attack  . Stroke Father     brain hemorrhage  . Diabetes Brother   . Cancer Brother   . Breast cancer Sister   . Cancer Sister     breast cancer    Prior to Admission medications   Medication Sig Start Date End Date Taking? Authorizing Provider  aspirin 81 MG tablet Take 81 mg by mouth daily.     Yes Historical Provider, MD  Blood Glucose Monitoring Suppl (ONE TOUCH ULTRA 2) W/DEVICE KIT Use to check blood sugars 1 time per day 03/08/13  Yes Elayne Snare, MD  calcium citrate (CALCITRATE - DOSED  IN MG ELEMENTAL CALCIUM) 950 MG tablet Take 200 mg of elemental calcium by mouth daily. 1/2 tab   Yes Historical Provider, MD  gabapentin (NEURONTIN) 300 MG capsule Take 300 mg by mouth 4 (four) times daily.   Yes Historical Provider, MD  GLYSET 50 MG tablet Take 1 tablet (50 mg total) by mouth 2 (two) times daily. Must Make Office Visit!!! Patient taking differently: Take 50-100 mg by mouth 2 (two) times daily. 50 mg qam and 100 mg qhs 02/26/16  Yes Reather Littler, MD  ibandronate (BONIVA) 150 MG tablet TAKE 1 TAB ONCE A MONTH IN THE AM ON AN EMPTY STOMACH WITH FULL GLAS OF WATER STAY UPRIGHT 30 MIN 11/08/14  Yes Reather Littler, MD  isosorbide mononitrate (IMDUR) 60 MG 24 hr tablet Take 1 tablet (60 mg total) by mouth daily. 01/04/15  Yes Rinaldo Cloud, MD  levothyroxine (SYNTHROID,  LEVOTHROID) 75 MCG tablet Take 1 tablet (75 mcg total) by mouth daily. 02/04/16  Yes Reather Littler, MD  losartan (COZAAR) 50 MG tablet Take 50 mg by mouth daily.  10/28/15  Yes Historical Provider, MD  metoprolol succinate (TOPROL-XL) 50 MG 24 hr tablet Take 1 tablet (50 mg total) by mouth daily. Take with or immediately following a meal. Patient taking differently: Take 25 mg by mouth daily. Take with or immediately following a meal. 01/04/15  Yes Rinaldo Cloud, MD  MYRBETRIQ 50 MG TB24 tablet Take 50 mg by mouth daily. 12/21/15  Yes Historical Provider, MD  nitroGLYCERIN (NITROSTAT) 0.4 MG SL tablet Place 1 tablet (0.4 mg total) under the tongue every 5 (five) minutes x 3 doses as needed for chest pain. 01/04/15  Yes Rinaldo Cloud, MD  pentosan polysulfate (ELMIRON) 100 MG capsule Take 100 mg by mouth 3 (three) times daily before meals.    Yes Historical Provider, MD  sitaGLIPtin-metformin (JANUMET) 50-500 MG tablet Take 1 tablet by mouth 2 (two) times daily with a meal. 01/25/16  Yes Reather Littler, MD  GLYSET 50 MG tablet TAKE 1 TABLET BY MOUTH 3 TIMES A DAY *NEED OFFICE VISIT Patient not taking: Reported on 02/29/2016 02/18/16   Reather Littler, MD    Physical Exam:    Constitutional: Elderly female in some moderate discomfort if moved. Otherwise appears in no acute distress when still.  Vitals:   02/29/16 2000 02/29/16 2005 02/29/16 2006  BP:  168/60   Pulse:  70   Resp:  16   Temp:  98 F (36.7 C)   TempSrc:  Oral   SpO2: 98% 100%   Weight:   51.3 kg (113 lb)  Height:   5\' 1"  (1.549 m)   Eyes: PERRL, lids and conjunctivae normal ENMT: Mucous membranes are moist. Posterior pharynx clear of any exudate or lesions.   Neck: normal, supple, no masses, no thyromegaly Respiratory: clear to auscultation bilaterally, no wheezing, no crackles. Normal respiratory effort. No accessory muscle use.  Cardiovascular: Regular rate and rhythm, no murmurs / rubs / gallops. No extremity edema. 2+ pedal pulses. No  carotid bruits.  Abdomen: no tenderness, no masses palpated. No hepatosplenomegaly. Bowel sounds positive.  Musculoskeletal: no clubbing / cyanosis. Left hip tender to palpation and noted to be slightly externally rotated. Skin: no rashes, lesions, ulcers. No induration Neurologic: CN 2-12 grossly intact. Sensation intact, DTR normal. Strength 5/5 in all 4.  Psychiatric: Normal judgment and insight. Alert and oriented x 3. Normal mood.     Labs on Admission: I have personally reviewed following labs and imaging studies  CBC:  Recent Labs Lab 02/29/16 2034  WBC 10.1  NEUTROABS 7.6  HGB 12.3  HCT 40.4  MCV 80.2  PLT 497   Basic Metabolic Panel:  Recent Labs Lab 02/29/16 2034  NA 137  K 5.8*  CL 104  CO2 24  GLUCOSE 165*  BUN 34*  CREATININE 1.27*  CALCIUM 9.5   GFR: Estimated Creatinine Clearance: 30.2 mL/min (by C-G formula based on SCr of 1.27 mg/dL (H)). Liver Function Tests: No results for input(s): AST, ALT, ALKPHOS, BILITOT, PROT, ALBUMIN in the last 168 hours. No results for input(s): LIPASE, AMYLASE in the last 168 hours. No results for input(s): AMMONIA in the last 168 hours. Coagulation Profile:  Recent Labs Lab 02/29/16 2034  INR 0.94   Cardiac Enzymes: No results for input(s): CKTOTAL, CKMB, CKMBINDEX, TROPONINI in the last 168 hours. BNP (last 3 results) No results for input(s): PROBNP in the last 8760 hours. HbA1C: No results for input(s): HGBA1C in the last 72 hours. CBG: No results for input(s): GLUCAP in the last 168 hours. Lipid Profile: No results for input(s): CHOL, HDL, LDLCALC, TRIG, CHOLHDL, LDLDIRECT in the last 72 hours. Thyroid Function Tests: No results for input(s): TSH, T4TOTAL, FREET4, T3FREE, THYROIDAB in the last 72 hours. Anemia Panel: No results for input(s): VITAMINB12, FOLATE, FERRITIN, TIBC, IRON, RETICCTPCT in the last 72 hours. Urine analysis:    Component Value Date/Time   COLORURINE YELLOW 12/07/2014 1103    APPEARANCEUR CLEAR 12/07/2014 1103   LABSPEC 1.010 12/07/2014 1103   LABSPEC 1.005 08/01/2009 1438   PHURINE 6.5 12/07/2014 1103   GLUCOSEU NEGATIVE 12/07/2014 1103   HGBUR NEGATIVE 12/07/2014 1103   BILIRUBINUR negative 07/17/2015 1449   BILIRUBINUR Negative 08/01/2009 1438   KETONESUR NEGATIVE 12/07/2014 1103   PROTEINUR negative 07/17/2015 1449   PROTEINUR Negative 08/01/2009 1438   PROTEINUR NEGATIVE 01/12/2009 0608   UROBILINOGEN 0.2 07/17/2015 1449   UROBILINOGEN 0.2 12/07/2014 1103   NITRITE negative 07/17/2015 1449   NITRITE NEGATIVE 12/07/2014 1103   LEUKOCYTESUR moderate (2+) (A) 07/17/2015 1449   LEUKOCYTESUR Negative 08/01/2009 1438   Sepsis Labs: No results found for this or any previous visit (from the past 240 hour(s)).   Radiological Exams on Admission: Dg Hip Unilat With Pelvis 2-3 Views Left  Result Date: 02/29/2016 CLINICAL DATA:  Left hip pain after fall today. EXAM: DG HIP (WITH OR WITHOUT PELVIS) 2-3V LEFT COMPARISON:  None. FINDINGS: Displaced left femoral neck fracture with angulation. No significant shortening. Femoral head remains seated in the acetabulum. Pubic rami are intact. No additional acute fracture. IMPRESSION: Displaced left femoral neck fracture with angulation. Electronically Signed   By: Jeb Levering M.D.   On: 02/29/2016 21:53    EKG: Independently reviewed. NSR  Assessment/Plan Left femoral neck fracture secondary to fall. Acute. After patient fell from trying to sit at the end of the bed. - Admit to a MedSurg bed transfer to [facility] - Hip facture protocol initiated  - NPO after midnight for possible surgery tomorrow - Hydrocodone/Dilaudid IV prn pain - Dr. Doran Durand of Orthopedics consulted, follow in a.m. for  Essential hypertension - Continue metoprolol, losartan and isosorbide mononitrate  Diabetes mellitus type II  - Hypoglycemic protocol - Held Janumet and Glyset - Continue gabapentin - CBGs every 6 hours with sensitive  SSI  Chronic kidney disease stage III: Patient presented with creatinine of 1.27 on admission baseline creatinine appears to be 1.06- 1.2. - IV fluids at 190m/hr  - Repeat BMP in a.m.  Hyperkalemia: Potassium initially 5.8 - IV fluids - Recheck in a.m.  Hypothyroidism - Continue levothyroxine  GERD prophylaxis: Protonix DVT prophylaxis: SCD  Code Status: Full  Family Communication:  Discussed care  Disposition Plan: TBD Consults called:  Orthopedics Admission status:  Inpatient   Norval Morton MD [facility] Pager [phone]  If 7PM-7AM, please contact night-coverage [email or web address] Password [facility]  02/29/2016, 10:54 PM

## 2016-03-01 ENCOUNTER — Observation Stay (HOSPITAL_COMMUNITY): Payer: Medicare Other

## 2016-03-01 DIAGNOSIS — G5600 Carpal tunnel syndrome, unspecified upper limb: Secondary | ICD-10-CM | POA: Diagnosis not present

## 2016-03-01 DIAGNOSIS — Z7984 Long term (current) use of oral hypoglycemic drugs: Secondary | ICD-10-CM | POA: Diagnosis not present

## 2016-03-01 DIAGNOSIS — Z96642 Presence of left artificial hip joint: Secondary | ICD-10-CM | POA: Diagnosis not present

## 2016-03-01 DIAGNOSIS — Z7982 Long term (current) use of aspirin: Secondary | ICD-10-CM | POA: Diagnosis not present

## 2016-03-01 DIAGNOSIS — R634 Abnormal weight loss: Secondary | ICD-10-CM | POA: Diagnosis not present

## 2016-03-01 DIAGNOSIS — I10 Essential (primary) hypertension: Secondary | ICD-10-CM | POA: Diagnosis not present

## 2016-03-01 DIAGNOSIS — E785 Hyperlipidemia, unspecified: Secondary | ICD-10-CM | POA: Diagnosis not present

## 2016-03-01 DIAGNOSIS — N179 Acute kidney failure, unspecified: Secondary | ICD-10-CM | POA: Diagnosis not present

## 2016-03-01 DIAGNOSIS — I129 Hypertensive chronic kidney disease with stage 1 through stage 4 chronic kidney disease, or unspecified chronic kidney disease: Secondary | ICD-10-CM | POA: Diagnosis present

## 2016-03-01 DIAGNOSIS — S72002D Fracture of unspecified part of neck of left femur, subsequent encounter for closed fracture with routine healing: Secondary | ICD-10-CM | POA: Diagnosis not present

## 2016-03-01 DIAGNOSIS — Z888 Allergy status to other drugs, medicaments and biological substances status: Secondary | ICD-10-CM | POA: Diagnosis not present

## 2016-03-01 DIAGNOSIS — Z833 Family history of diabetes mellitus: Secondary | ICD-10-CM | POA: Diagnosis not present

## 2016-03-01 DIAGNOSIS — F419 Anxiety disorder, unspecified: Secondary | ICD-10-CM | POA: Diagnosis present

## 2016-03-01 DIAGNOSIS — W19XXXA Unspecified fall, initial encounter: Secondary | ICD-10-CM | POA: Diagnosis not present

## 2016-03-01 DIAGNOSIS — N183 Chronic kidney disease, stage 3 (moderate): Secondary | ICD-10-CM | POA: Diagnosis not present

## 2016-03-01 DIAGNOSIS — E7800 Pure hypercholesterolemia, unspecified: Secondary | ICD-10-CM | POA: Diagnosis present

## 2016-03-01 DIAGNOSIS — M1990 Unspecified osteoarthritis, unspecified site: Secondary | ICD-10-CM | POA: Diagnosis not present

## 2016-03-01 DIAGNOSIS — M25552 Pain in left hip: Secondary | ICD-10-CM | POA: Diagnosis not present

## 2016-03-01 DIAGNOSIS — Z79899 Other long term (current) drug therapy: Secondary | ICD-10-CM | POA: Diagnosis not present

## 2016-03-01 DIAGNOSIS — E039 Hypothyroidism, unspecified: Secondary | ICD-10-CM | POA: Diagnosis not present

## 2016-03-01 DIAGNOSIS — Y92009 Unspecified place in unspecified non-institutional (private) residence as the place of occurrence of the external cause: Secondary | ICD-10-CM | POA: Diagnosis not present

## 2016-03-01 DIAGNOSIS — E875 Hyperkalemia: Secondary | ICD-10-CM | POA: Diagnosis not present

## 2016-03-01 DIAGNOSIS — Z471 Aftercare following joint replacement surgery: Secondary | ICD-10-CM | POA: Diagnosis not present

## 2016-03-01 DIAGNOSIS — Z90710 Acquired absence of both cervix and uterus: Secondary | ICD-10-CM | POA: Diagnosis not present

## 2016-03-01 DIAGNOSIS — D62 Acute posthemorrhagic anemia: Secondary | ICD-10-CM | POA: Diagnosis not present

## 2016-03-01 DIAGNOSIS — Z823 Family history of stroke: Secondary | ICD-10-CM | POA: Diagnosis not present

## 2016-03-01 DIAGNOSIS — M7989 Other specified soft tissue disorders: Secondary | ICD-10-CM | POA: Diagnosis not present

## 2016-03-01 DIAGNOSIS — M6281 Muscle weakness (generalized): Secondary | ICD-10-CM | POA: Diagnosis not present

## 2016-03-01 DIAGNOSIS — D631 Anemia in chronic kidney disease: Secondary | ICD-10-CM | POA: Diagnosis present

## 2016-03-01 DIAGNOSIS — S72002A Fracture of unspecified part of neck of left femur, initial encounter for closed fracture: Secondary | ICD-10-CM | POA: Diagnosis not present

## 2016-03-01 DIAGNOSIS — E1149 Type 2 diabetes mellitus with other diabetic neurological complication: Secondary | ICD-10-CM | POA: Diagnosis not present

## 2016-03-01 DIAGNOSIS — S59902A Unspecified injury of left elbow, initial encounter: Secondary | ICD-10-CM | POA: Diagnosis not present

## 2016-03-01 DIAGNOSIS — M25522 Pain in left elbow: Secondary | ICD-10-CM | POA: Diagnosis not present

## 2016-03-01 DIAGNOSIS — Z9181 History of falling: Secondary | ICD-10-CM | POA: Diagnosis not present

## 2016-03-01 DIAGNOSIS — E860 Dehydration: Secondary | ICD-10-CM | POA: Diagnosis present

## 2016-03-01 DIAGNOSIS — M25512 Pain in left shoulder: Secondary | ICD-10-CM | POA: Diagnosis not present

## 2016-03-01 DIAGNOSIS — E1122 Type 2 diabetes mellitus with diabetic chronic kidney disease: Secondary | ICD-10-CM | POA: Diagnosis not present

## 2016-03-01 DIAGNOSIS — K219 Gastro-esophageal reflux disease without esophagitis: Secondary | ICD-10-CM | POA: Diagnosis present

## 2016-03-01 DIAGNOSIS — Z8249 Family history of ischemic heart disease and other diseases of the circulatory system: Secondary | ICD-10-CM | POA: Diagnosis not present

## 2016-03-01 DIAGNOSIS — W1839XA Other fall on same level, initial encounter: Secondary | ICD-10-CM | POA: Diagnosis present

## 2016-03-01 LAB — CBC
HCT: 35.8 % — ABNORMAL LOW (ref 36.0–46.0)
Hemoglobin: 10.8 g/dL — ABNORMAL LOW (ref 12.0–15.0)
MCH: 24.1 pg — ABNORMAL LOW (ref 26.0–34.0)
MCHC: 30.2 g/dL (ref 30.0–36.0)
MCV: 79.7 fL (ref 78.0–100.0)
Platelets: 200 10*3/uL (ref 150–400)
RBC: 4.49 MIL/uL (ref 3.87–5.11)
RDW: 16.2 % — ABNORMAL HIGH (ref 11.5–15.5)
WBC: 10.9 10*3/uL — ABNORMAL HIGH (ref 4.0–10.5)

## 2016-03-01 LAB — BASIC METABOLIC PANEL
Anion gap: 9 (ref 5–15)
BUN: 27 mg/dL — ABNORMAL HIGH (ref 6–20)
CO2: 20 mmol/L — ABNORMAL LOW (ref 22–32)
Calcium: 8.4 mg/dL — ABNORMAL LOW (ref 8.9–10.3)
Chloride: 107 mmol/L (ref 101–111)
Creatinine, Ser: 1.07 mg/dL — ABNORMAL HIGH (ref 0.44–1.00)
GFR calc Af Amer: 59 mL/min — ABNORMAL LOW (ref >60)
GFR calc non Af Amer: 51 mL/min — ABNORMAL LOW (ref >60)
Glucose, Bld: 171 mg/dL — ABNORMAL HIGH (ref 65–99)
Potassium: 5.9 mmol/L — ABNORMAL HIGH (ref 3.5–5.1)
Sodium: 136 mmol/L (ref 135–145)

## 2016-03-01 LAB — URINALYSIS, ROUTINE W REFLEX MICROSCOPIC
Bilirubin Urine: NEGATIVE
Glucose, UA: 150 mg/dL — AB
Hgb urine dipstick: NEGATIVE
Ketones, ur: NEGATIVE mg/dL
Leukocytes, UA: NEGATIVE
Nitrite: NEGATIVE
Protein, ur: NEGATIVE mg/dL
Specific Gravity, Urine: 1.016 (ref 1.005–1.030)
pH: 5 (ref 5.0–8.0)

## 2016-03-01 LAB — TYPE AND SCREEN
ABO/RH(D): A POS
Antibody Screen: NEGATIVE

## 2016-03-01 LAB — SURGICAL PCR SCREEN
MRSA, PCR: NEGATIVE
Staphylococcus aureus: NEGATIVE

## 2016-03-01 LAB — GLUCOSE, CAPILLARY
Glucose-Capillary: 174 mg/dL — ABNORMAL HIGH (ref 65–99)
Glucose-Capillary: 132 mg/dL — ABNORMAL HIGH (ref 65–99)
Glucose-Capillary: 150 mg/dL — ABNORMAL HIGH (ref 65–99)
Glucose-Capillary: 299 mg/dL — ABNORMAL HIGH (ref 65–99)

## 2016-03-01 LAB — ABO/RH: ABO/RH(D): A POS

## 2016-03-01 LAB — POTASSIUM: Potassium: 4.9 mmol/L (ref 3.5–5.1)

## 2016-03-01 MED ORDER — FUROSEMIDE 10 MG/ML IJ SOLN
20.0000 mg | Freq: Once | INTRAMUSCULAR | Status: AC
  Administered 2016-03-01: 20 mg via INTRAVENOUS
  Filled 2016-03-01: qty 2

## 2016-03-01 MED ORDER — BISACODYL 10 MG RE SUPP
10.0000 mg | Freq: Every day | RECTAL | Status: DC
  Administered 2016-03-01: 10 mg via RECTAL
  Filled 2016-03-01: qty 1

## 2016-03-01 MED ORDER — SODIUM CHLORIDE 0.9 % IV SOLN: INTRAVENOUS | Status: AC

## 2016-03-01 MED ORDER — TRANEXAMIC ACID 1000 MG/10ML IV SOLN
1000.0000 mg | INTRAVENOUS | Status: DC
  Filled 2016-03-01 (×2): qty 10

## 2016-03-01 MED ORDER — HEPARIN SODIUM (PORCINE) 5000 UNIT/ML IJ SOLN: 5000.0000 [IU] | Freq: Three times a day (TID) | INTRAMUSCULAR | Status: DC

## 2016-03-01 MED ORDER — CEFAZOLIN SODIUM-DEXTROSE 2-4 GM/100ML-% IV SOLN
2.0000 g | INTRAVENOUS | Status: DC
  Filled 2016-03-01 (×2): qty 100

## 2016-03-01 MED ORDER — INSULIN ASPART 100 UNIT/ML ~~LOC~~ SOLN
0.0000 [IU] | Freq: Four times a day (QID) | SUBCUTANEOUS | Status: DC
  Administered 2016-03-01: 1 [IU] via SUBCUTANEOUS
  Administered 2016-03-01: 2 [IU] via SUBCUTANEOUS
  Administered 2016-03-01: 1 [IU] via SUBCUTANEOUS
  Administered 2016-03-02: 3 [IU] via SUBCUTANEOUS
  Administered 2016-03-02: 2 [IU] via SUBCUTANEOUS
  Administered 2016-03-03: 3 [IU] via SUBCUTANEOUS
  Administered 2016-03-03: 2 [IU] via SUBCUTANEOUS
  Administered 2016-03-03: 1 [IU] via SUBCUTANEOUS
  Administered 2016-03-04: 7 [IU] via SUBCUTANEOUS
  Administered 2016-03-04: 3 [IU] via SUBCUTANEOUS
  Administered 2016-03-04: 1 [IU] via SUBCUTANEOUS

## 2016-03-01 MED ORDER — SORBITOL 70 % SOLN
960.0000 mL | TOPICAL_OIL | Freq: Once | ORAL | Status: DC
  Filled 2016-03-01: qty 240

## 2016-03-01 MED ORDER — SODIUM CHLORIDE 0.9 % IV SOLN
INTRAVENOUS | Status: AC
  Administered 2016-03-01 – 2016-03-02 (×2): via INTRAVENOUS

## 2016-03-01 MED ORDER — METOPROLOL TARTRATE 5 MG/5ML IV SOLN: 5.0000 mg | INTRAVENOUS | Status: DC | PRN

## 2016-03-01 MED ORDER — MORPHINE SULFATE (PF) 4 MG/ML IV SOLN
4.0000 mg | Freq: Once | INTRAVENOUS | Status: AC
  Administered 2016-03-01: 4 mg via INTRAVENOUS
  Filled 2016-03-01: qty 1

## 2016-03-01 MED ORDER — PANTOPRAZOLE SODIUM 40 MG PO TBEC
40.0000 mg | DELAYED_RELEASE_TABLET | Freq: Every day | ORAL | Status: DC
  Administered 2016-03-01 – 2016-03-05 (×4): 40 mg via ORAL
  Filled 2016-03-01 (×4): qty 1

## 2016-03-01 MED ORDER — SODIUM POLYSTYRENE SULFONATE 15 GM/60ML PO SUSP
30.0000 g | Freq: Once | ORAL | Status: AC
  Administered 2016-03-01: 30 g via ORAL
  Filled 2016-03-01: qty 120

## 2016-03-01 MED ORDER — HYDROMORPHONE HCL 2 MG/ML IJ SOLN
1.0000 mg | INTRAMUSCULAR | Status: AC | PRN
  Administered 2016-03-01: 1 mg via INTRAVENOUS
  Filled 2016-03-01: qty 1

## 2016-03-01 NOTE — Progress Notes (Addendum)
PROGRESS NOTE                                                                                                                                                                                                             Patient Demographics:    Abigail Hoover, is a 70 y.o. female, DOB - September 29, 1945, SWN:[id number]  Admit date - 02/29/2016   Admitting Physician Norval Morton, MD  Outpatient Primary MD for the patient is [name], MD  LOS - 1  Chief Complaint  Patient presents with  . Hip Pain       Brief Narrative    Abigail Hoover is a 70 y.o. female with medical history significant of HTN, HLD, DM type 2, and anemia;  who presents after having a fall at home complaining of left hip pain. Tonight the patient had been trying to sit at the edge of the bed, but had missed the bed onto her left hip. Patient noted acute left hip pain for which she was unable to stand or bear weight on the affected leg. Rated pain as a 8 out of 10 on the pain scale. 8 days ago the patient had undergone right arthroscopic knee surgery by Dr. Veverly Fells and had been at home recovering well per family prior to the event tonight.    Subjective:    [name] today has, No headache, No chest pain, No abdominal pain - No Nausea, No new weakness tingling or numbness, No Cough - SOB.     Assessment  & Plan :     1.Mechanical fall with right displaced left femoral neck fracture. Seen by orthopedics, due for surgery on 03/02/2016 due to hyperkalemia today, patient is a moderate risk for adverse cardiopulmonary outcome during the perioperative period, risks and benefits explained to the patient and husband both accepted and wanted to proceed with surgery tomorrow. We'll defer weightbearing and DVT prophylaxis to orthopedics.  Discussed her case with her cardiologist Dr. Terrence Dupont, she underwent left heart cath last year, echocardiogram last year,  coronaries and EF preserved, stress test a few months ago was unremarkable as well.   2. Hyperkalemia. In the setting of ARB use, hold ARB, hydrate, bowel regimen to ensure bowel movement and GI loss, Kayexalate given, monitor potassium.  3. ARF. Likely due to dehydration and ARB use, hold ARB, hydrate  and monitor renal function, ultrasound stable.  4. Hypothyroidism. Continue Synthroid.  5. GERD. On PPI.  6. Hypertension. On Lopressor and Imdur continue, as needed IV Lopressor.  7. DM type II. On sliding scale.  Lab Results  Component Value Date   HGBA1C 5.8 01/19/2016   CBG (last 3)   Recent Labs  03/01/16 0621  GLUCAP 174*        Family Communication  :  husband  Code Status : Full  Diet : Diet NPO time specified Except for: Sips with Meds Diet vegetarian Room service appropriate? Yes; Fluid consistency: Thin   Disposition Plan  :  TBD  Consults  :  Ortho  Procedures  :  None  DVT Prophylaxis  :  SCD only per Ortho  Lab Results  Component Value Date   PLT 200 03/01/2016    Inpatient Medications  Scheduled Meds: . aspirin EC  81 mg Oral Daily  . bisacodyl  10 mg Rectal Daily  .  ceFAZolin (ANCEF) IV  2 g Intravenous To SS-Surg  . gabapentin  300 mg Oral QID  . insulin aspart  0-9 Units Subcutaneous Q6H  . isosorbide mononitrate  60 mg Oral Daily  . levothyroxine  75 mcg Oral QAC breakfast  . metoprolol succinate  25 mg Oral Daily  . mirabegron ER  50 mg Oral Daily  . pantoprazole  40 mg Oral Daily  . pentosan polysulfate  100 mg Oral TID AC  . sorbitol, milk of mag, mineral oil, glycerin (SMOG) enema  960 mL Rectal Once  . tranexamic acid  1,000 mg Intravenous To OR   Continuous Infusions: . sodium chloride 100 mL/hr at 03/01/16 0047   PRN Meds:.HYDROcodone-acetaminophen, HYDROmorphone (DILAUDID) injection, metoprolol, nitroGLYCERIN, ondansetron (ZOFRAN) IV  Antibiotics  :    Anti-infectives    Start     Dose/Rate Route Frequency Ordered  Stop   03/01/16 0730  ceFAZolin (ANCEF) IVPB 2g/100 mL premix     2 g 200 mL/hr over 30 Minutes Intravenous To [facility] 03/01/16 0707 03/02/16 0730         Objective:   Vitals:   03/01/16 0100 03/01/16 0111 03/01/16 0156 03/01/16 0550  BP: 137/55 139/64 (!) 151/51 (!) 145/62  Pulse:  82 86 80  Resp:  18 18 18   Temp:   98.5 F (36.9 C) 98 F (36.7 C)  TempSrc:   Oral Oral  SpO2:  97% 95% 97%  Weight:      Height:        Wt Readings from Last 3 Encounters:  02/29/16 51.3 kg (113 lb)  01/30/16 52.2 kg (115 lb 1.6 oz)  01/25/16 50.8 kg (112 lb)     Intake/Output Summary (Last 24 hours) at 03/01/16 1105 Last data filed at 03/01/16 0300  Gross per 24 hour  Intake                0 ml  Output              350 ml  Net             -350 ml     Physical Exam  Awake Alert, Oriented X 3, No new F.N deficits, Normal affect .AT,PERRAL Supple Neck,No JVD, No cervical lymphadenopathy appriciated.  Symmetrical Chest wall movement, Good air movement bilaterally, CTAB RRR,No Gallops,Rubs or new Murmurs, No Parasternal Heave +ve B.Sounds, Abd Soft, No tenderness, No organomegaly appriciated, No rebound - guarding or rigidity. No Cyanosis, Clubbing or edema, No  new Rash or bruise Left leg externally rotated, slightly tender in the left hip area on palpation      Data Review:    CBC  Recent Labs Lab 02/29/16 2034 03/01/16 0639  WBC 10.1 10.9*  HGB 12.3 10.8*  HCT 40.4 35.8*  PLT 216 200  MCV 80.2 79.7  MCH 24.4* 24.1*  MCHC 30.4 30.2  RDW 16.4* 16.2*  LYMPHSABS 1.9  --   MONOABS 0.4  --   EOSABS 0.3  --   BASOSABS 0.0  --     Chemistries   Recent Labs Lab 02/29/16 2034 03/01/16 0639  NA 137 136  K 5.8* 5.9*  CL 104 107  CO2 24 20*  GLUCOSE 165* 171*  BUN 34* 27*  CREATININE 1.27* 1.07*  CALCIUM 9.5 8.4*   ------------------------------------------------------------------------------------------------------------------ No results for  input(s): CHOL, HDL, LDLCALC, TRIG, CHOLHDL, LDLDIRECT in the last 72 hours.  Lab Results  Component Value Date   HGBA1C 5.8 01/19/2016   ------------------------------------------------------------------------------------------------------------------ No results for input(s): TSH, T4TOTAL, T3FREE, THYROIDAB in the last 72 hours.  Invalid input(s): FREET3 ------------------------------------------------------------------------------------------------------------------ No results for input(s): VITAMINB12, FOLATE, FERRITIN, TIBC, IRON, RETICCTPCT in the last 72 hours.  Coagulation profile  Recent Labs Lab 02/29/16 2034  INR 0.94    No results for input(s): DDIMER in the last 72 hours.  Cardiac Enzymes  Recent Labs Lab 02/29/16 2303  TROPONINI <0.03   ------------------------------------------------------------------------------------------------------------------ No results found for: BNP  Micro Results  Recent Results (from the past 240 hour(s))  Surgical PCR screen     Status: None   Collection Time: 03/01/16  4:18 AM  Result Value Ref Range Status   MRSA, PCR NEGATIVE NEGATIVE Final   Staphylococcus aureus NEGATIVE NEGATIVE Final    Comment:        The Xpert SA Assay (FDA approved for NASAL specimens in patients over 52 years of age), is one component of a comprehensive surveillance program.  Test performance has been validated by [facility] for patients greater than or equal to 71 year old. It is not intended to diagnose infection nor to guide or monitor treatment.     Radiology Reports  Dg Elbow Complete Left (3+view)  Result Date: 03/01/2016 CLINICAL DATA:  Left elbow pain with fall from bed, hip fx, concern for elbow fx as well EXAM: LEFT ELBOW - COMPLETE 3+ VIEW COMPARISON:  01/09/2009 FINDINGS: There is no evidence of fracture, dislocation, or joint effusion. Soft tissues are unremarkable. No joint effusion. Bones appear osteopenic. IMPRESSION: No  evidence for acute  abnormality. Electronically Signed   By: Nolon Nations M.D.   On: 03/01/2016 09:32   US Renal  Result Date: 02/14/2016 CLINICAL DATA:  Hyponatremia, hypertension, diabetes EXAM: RENAL / URINARY TRACT ULTRASOUND COMPLETE COMPARISON:  None. FINDINGS: Right Kidney: Length: 8.3 centimeter. No hydronephrosis is seen. The echogenicity of the renal parenchyma is somewhat increased. Left Kidney: Length: 8.8 centimeter. No hydronephrosis is noted. The parenchyma of the kidney also is somewhat increased suggesting possible chronic renal medical disease. Correlation with renal laboratory values is recommended. Bladder: The urinary bladder is not well distended but no gross abnormality is evident. IMPRESSION: 1. No hydronephrosis. 2. Somewhat echogenic renal parenchyma suggests chronic renal medical disease. Correlate with renal laboratory values appear Electronically Signed   By: Ivar Drape M.D.   On: 02/14/2016 07:39   Dg Chest Portable 1 View  Result Date: 02/29/2016 CLINICAL DATA:  Left hip fracture. EXAM: PORTABLE CHEST 1 VIEW COMPARISON:  01/02/2015 FINDINGS:  A single AP portable view of the chest demonstrates no focal airspace consolidation or alveolar edema. The lungs are grossly clear. There is no large effusion or pneumothorax. Cardiac and mediastinal contours appear unremarkable. IMPRESSION: No active disease. Electronically Signed   By: Andreas Newport M.D.   On: 02/29/2016 22:53   Dg Shoulder Left  Result Date: 03/01/2016 CLINICAL DATA:  LEFT shoulder pain and swelling after fall today. History of arthritis. EXAM: LEFT SHOULDER - 2+ VIEW COMPARISON:  None. FINDINGS: There is no evidence of fracture or dislocation. There is no evidence of advanced arthropathy or other focal bone abnormality. Mild spurring of the humeral head. Osteopenia. Soft tissues are unremarkable. IMPRESSION: No acute fracture deformity or dislocation. Osteopenia, decreasing sensitivity for acute  nondisplaced fractures. Electronically Signed   By: Elon Alas M.D.   On: 03/01/2016 01:36   Dg Knee Left Port  Result Date: 02/29/2016 CLINICAL DATA:  Left knee pain without trauma.  One year duration. EXAM: PORTABLE LEFT KNEE - 1-2 VIEW COMPARISON:  None. FINDINGS: Negative for acute fracture or dislocation. No bone lesion or bony destruction. No joint effusion. IMPRESSION: Negative. Electronically Signed   By: Andreas Newport M.D.   On: 02/29/2016 23:25   Dg Hip Unilat With Pelvis 2-3 Views Left  Result Date: 02/29/2016 CLINICAL DATA:  Left hip pain after fall today. EXAM: DG HIP (WITH OR WITHOUT PELVIS) 2-3V LEFT COMPARISON:  None. FINDINGS: Displaced left femoral neck fracture with angulation. No significant shortening. Femoral head remains seated in the acetabulum. Pubic rami are intact. No additional acute fracture. IMPRESSION: Displaced left femoral neck fracture with angulation. Electronically Signed   By: Jeb Levering M.D.   On: 02/29/2016 21:53    Time Spent in minutes  30   Christophr Calix K M.D on 03/01/2016 at 11:05 AM  Between 7am to 7pm - Pager - [phone]  After 7pm go to [email or web address] - password [facility]  [facility] -  Office  [phone]

## 2016-03-01 NOTE — ED Provider Notes (Signed)
Patient is now complaining of pain in her left arm. On exam, there is mild swelling and significant tenderness around the left shoulder. X-rays have been ordered.   Delora Fuel, MD 51/10/21 1173

## 2016-03-01 NOTE — Consult Note (Signed)
ORTHOPAEDIC CONSULTATION  REQUESTING PHYSICIAN: Norval Morton, MD  PCP:  [name], MD  Chief Complaint: Displaced left femoral neck fracture  HPI: Abigail Hoover is a 70 y.o. female with a history of CKD stage III, HTN, HLD, DM type 2, and anemia who complains of left hip pain after she fell at home last night. Patient does have history of right knee arthroscopy about a week ago by Dr. Alma Friendly. She complains of left hip pain and she is unable to weight-bear due to left hip pain. She was admitted to the hospitalist service, and orthopedic consultation was requested for management of her hip fracture. She denies other injuries. Upon admission, she was noted to have hyperkalemia. The hospitalist is currently managing this with IV hydration and Kayexalate.   Past Medical History:  Diagnosis Date  . Anemia   . 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 Anxiety   . Arthritis   . Bladder irritation   . Deficiency anemia 05/27/2014  . Diabetes mellitus   . GERD (gastroesophageal reflux disease)   . Hypercholesterolemia   . Hypertension   . Hypothyroidism   . Iron deficiency anemia, unspecified   . Psoriasis   . Thyroid disease    Past Surgical History:  Procedure Laterality Date  . ABDOMINAL HYSTERECTOMY     TAH  . BREAST BIOPSY     Left breast biopsy benign lesion  . BREAST BIOPSY  03/11/2012   Procedure: BREAST BIOPSY;  Surgeon: Odis Hollingshead, MD;  Location: [place];  Service: General;  Laterality: Left;  remove left breast mass  . CARDIAC CATHETERIZATION N/A 01/02/2015   Procedure: Left Heart Cath and Coronary Angiography;  Surgeon: Charolette Forward, MD;  Location: [place] [facility];  Service: Cardiovascular;  Laterality: N/A;  . CATARACT EXTRACTION    . CERVICAL DISC SURGERY    . COLONOSCOPY  2013   neg. next 2023.   Marland Kitchen EYE SURGERY     Social History   Social History  . Marital status: Married    Spouse name: N/A  . Number of children: 1  . Years of education:  college   Occupational History  . Homemaker    Social History Main Topics  . Smoking status: Never Smoker  . Smokeless tobacco: Never Used  . Alcohol use No  . Drug use: No  . Sexual activity: Not Asked   Other Topics Concern  . None   Social History Narrative   Patient is married with one child.   Patient is right handed.   Patient has college education.   Patient drinks 1 cup daily.   Family History  Problem Relation Age of Onset  . Heart disease Mother     heart attack  . Stroke Father     brain hemorrhage  . Diabetes Brother   . Cancer Brother   . Breast cancer Sister   . Cancer Sister     breast cancer   Allergies  Allergen Reactions  . Atorvastatin Rash  . Ezetimibe Itching   Prior to Admission medications   Medication Sig Start Date End Date Taking? Authorizing Provider  aspirin 81 MG tablet Take 81 mg by mouth daily.     Yes Historical Provider, MD  Blood Glucose Monitoring Suppl (ONE TOUCH ULTRA 2) W/DEVICE KIT Use to check blood sugars 1 time per day 03/08/13  Yes Elayne Snare, MD  calcium citrate (CALCITRATE - DOSED IN MG ELEMENTAL CALCIUM) 950 MG tablet Take 200 mg of  elemental calcium by mouth daily. 1/2 tab   Yes Historical Provider, MD  gabapentin (NEURONTIN) 300 MG capsule Take 300 mg by mouth 4 (four) times daily.   Yes Historical Provider, MD  GLYSET 50 MG tablet Take 1 tablet (50 mg total) by mouth 2 (two) times daily. Must Make Office Visit!!! Patient taking differently: Take 50-100 mg by mouth 2 (two) times daily. 50 mg qam and 100 mg qhs 02/26/16  Yes Elayne Snare, MD  ibandronate (BONIVA) 150 MG tablet TAKE 1 TAB ONCE A MONTH IN THE AM ON AN EMPTY STOMACH WITH FULL GLAS OF WATER STAY UPRIGHT 30 MIN 11/08/14  Yes Elayne Snare, MD  isosorbide mononitrate (IMDUR) 60 MG 24 hr tablet Take 1 tablet (60 mg total) by mouth daily. 01/04/15  Yes Charolette Forward, MD  levothyroxine (SYNTHROID, LEVOTHROID) 75 MCG tablet Take 1 tablet (75 mcg total) by mouth daily.  02/04/16  Yes Elayne Snare, MD  losartan (COZAAR) 50 MG tablet Take 50 mg by mouth daily.  10/28/15  Yes Historical Provider, MD  metoprolol succinate (TOPROL-XL) 50 MG 24 hr tablet Take 1 tablet (50 mg total) by mouth daily. Take with or immediately following a meal. Patient taking differently: Take 25 mg by mouth daily. Take with or immediately following a meal. 01/04/15  Yes Charolette Forward, MD  MYRBETRIQ 50 MG TB24 tablet Take 50 mg by mouth daily. 12/21/15  Yes Historical Provider, MD  nitroGLYCERIN (NITROSTAT) 0.4 MG SL tablet Place 1 tablet (0.4 mg total) under the tongue every 5 (five) minutes x 3 doses as needed for chest pain. 01/04/15  Yes Charolette Forward, MD  pentosan polysulfate (ELMIRON) 100 MG capsule Take 100 mg by mouth 3 (three) times daily before meals.    Yes Historical Provider, MD  sitaGLIPtin-metformin (JANUMET) 50-500 MG tablet Take 1 tablet by mouth 2 (two) times daily with a meal. 01/25/16  Yes Elayne Snare, MD  GLYSET 50 MG tablet TAKE 1 TABLET BY MOUTH 3 TIMES A DAY *NEED OFFICE VISIT Patient not taking: Reported on 02/29/2016 02/18/16   Elayne Snare, MD   Dg Chest Portable 1 View  Result Date: 02/29/2016 CLINICAL DATA:  Left hip fracture. EXAM: PORTABLE CHEST 1 VIEW COMPARISON:  01/02/2015 FINDINGS: A single AP portable view of the chest demonstrates no focal airspace consolidation or alveolar edema. The lungs are grossly clear. There is no large effusion or pneumothorax. Cardiac and mediastinal contours appear unremarkable. IMPRESSION: No active disease. Electronically Signed   By: Andreas Newport M.D.   On: 02/29/2016 22:53   Dg Shoulder Left  Result Date: 03/01/2016 CLINICAL DATA:  LEFT shoulder pain and swelling after fall today. History of arthritis. EXAM: LEFT SHOULDER - 2+ VIEW COMPARISON:  None. FINDINGS: There is no evidence of fracture or dislocation. There is no evidence of advanced arthropathy or other focal bone abnormality. Mild spurring of the humeral head. Osteopenia.  Soft tissues are unremarkable. IMPRESSION: No acute fracture deformity or dislocation. Osteopenia, decreasing sensitivity for acute nondisplaced fractures. Electronically Signed   By: Elon Alas M.D.   On: 03/01/2016 01:36   Dg Knee Left Port  Result Date: 02/29/2016 CLINICAL DATA:  Left knee pain without trauma.  One year duration. EXAM: PORTABLE LEFT KNEE - 1-2 VIEW COMPARISON:  None. FINDINGS: Negative for acute fracture or dislocation. No bone lesion or bony destruction. No joint effusion. IMPRESSION: Negative. Electronically Signed   By: Andreas Newport M.D.   On: 02/29/2016 23:25   Dg Hip Unilat With Pelvis 2-3  Views Left  Result Date: 02/29/2016 CLINICAL DATA:  Left hip pain after fall today. EXAM: DG HIP (WITH OR WITHOUT PELVIS) 2-3V LEFT COMPARISON:  None. FINDINGS: Displaced left femoral neck fracture with angulation. No significant shortening. Femoral head remains seated in the acetabulum. Pubic rami are intact. No additional acute fracture. IMPRESSION: Displaced left femoral neck fracture with angulation. Electronically Signed   By: Jeb Levering M.D.   On: 02/29/2016 21:53    Positive ROS: All other systems have been reviewed and were otherwise negative with the exception of those mentioned in the HPI and as above.  Physical Exam: General: Alert, no acute distress Cardiovascular: No pedal edema Respiratory: No cyanosis, no use of accessory musculature GI: No organomegaly, abdomen is soft and non-tender Skin: No lesions in the area of chief complaint Neurologic: Sensation intact distally Psychiatric: Patient is competent for consent with normal mood and affect Lymphatic: No axillary or cervical lymphadenopathy  MUSCULOSKELETAL: Examination of the left lower extremity reveals that it is shortened and externally rotated. There are no skin wounds or lesions over her hip. She has pain with attempted logrolling of the hip. She has a palpable pedal pulse. She has positive  motor function dorsiflexion, plantarflexion, and great toe extension. She reports intact sensation to light touch.  Assessment: Displaced left femoral neck fracture  Plan: I discussed the findings with the patient and her husband. Due to her activity level, I recommended left total hip arthroplasty. We discussed the risks benefits, and alternatives. I spoke with Dr. Candiss Norse, the patient's hospitalist, regarding her elevated potassium (5.9 this am). He recommends that we delay surgery until her potassium begins trending down. Therefore, we will plan for surgery tomorrow. Nothing by mouth after midnight. Hold chemical DVT prophylaxis.   The risks, benefits, and alternatives were discussed with the patient. There are risks associated with the surgery including, but not limited to, problems with anesthesia (death), infection, instability (giving out of the joint), dislocation, differences in leg length/angulation/rotation, fracture of bones, loosening or failure of implants, hematoma (blood accumulation) which may require surgical drainage, blood clots, pulmonary embolism, nerve injury (foot drop and lateral thigh numbness), and blood vessel injury. The patient understands these risks and elects to proceed.   Rosa Gambale, Horald Pollen, MD Cell [phone]    03/01/2016 8:27 AM

## 2016-03-02 ENCOUNTER — Inpatient Hospital Stay (HOSPITAL_COMMUNITY): Payer: Medicare Other | Admitting: Anesthesiology

## 2016-03-02 ENCOUNTER — Inpatient Hospital Stay (HOSPITAL_COMMUNITY): Payer: Medicare Other

## 2016-03-02 ENCOUNTER — Encounter (HOSPITAL_COMMUNITY)
Admission: EM | Disposition: A | Discharge status: Skilled Nursing Facility | Payer: Self-pay | Source: Home / Self Care | Attending: Internal Medicine

## 2016-03-02 DIAGNOSIS — S72002A Fracture of unspecified part of neck of left femur, initial encounter for closed fracture: Secondary | ICD-10-CM | POA: Diagnosis present

## 2016-03-02 HISTORY — PX: TOTAL HIP ARTHROPLASTY: SHX124

## 2016-03-02 LAB — GLUCOSE, CAPILLARY
Glucose-Capillary: 124 mg/dL — ABNORMAL HIGH (ref 65–99)
Glucose-Capillary: 153 mg/dL — ABNORMAL HIGH (ref 65–99)
Glucose-Capillary: 174 mg/dL — ABNORMAL HIGH (ref 65–99)
Glucose-Capillary: 175 mg/dL — ABNORMAL HIGH (ref 65–99)
Glucose-Capillary: 200 mg/dL — ABNORMAL HIGH (ref 65–99)
Glucose-Capillary: 227 mg/dL — ABNORMAL HIGH (ref 65–99)

## 2016-03-02 LAB — HEPATIC FUNCTION PANEL
ALT: 28 U/L (ref 14–54)
AST: 47 U/L — ABNORMAL HIGH (ref 15–41)
Albumin: 3 g/dL — ABNORMAL LOW (ref 3.5–5.0)
Alkaline Phosphatase: 56 U/L (ref 38–126)
Bilirubin, Direct: 0.2 mg/dL (ref 0.1–0.5)
Indirect Bilirubin: 0.6 mg/dL (ref 0.3–0.9)
Total Bilirubin: 0.8 mg/dL (ref 0.3–1.2)
Total Protein: 5.2 g/dL — ABNORMAL LOW (ref 6.5–8.1)

## 2016-03-02 LAB — CBC
HCT: 34.9 % — ABNORMAL LOW (ref 36.0–46.0)
Hemoglobin: 10.6 g/dL — ABNORMAL LOW (ref 12.0–15.0)
MCH: 24.5 pg — ABNORMAL LOW (ref 26.0–34.0)
MCHC: 30.4 g/dL (ref 30.0–36.0)
MCV: 80.6 fL (ref 78.0–100.0)
Platelets: 202 10*3/uL (ref 150–400)
RBC: 4.33 MIL/uL (ref 3.87–5.11)
RDW: 16.7 % — ABNORMAL HIGH (ref 11.5–15.5)
WBC: 7.4 10*3/uL (ref 4.0–10.5)

## 2016-03-02 LAB — MAGNESIUM: Magnesium: 1.4 mg/dL — ABNORMAL LOW (ref 1.7–2.4)

## 2016-03-02 LAB — BASIC METABOLIC PANEL
Anion gap: 6 (ref 5–15)
BUN: 17 mg/dL (ref 6–20)
CO2: 25 mmol/L (ref 22–32)
Calcium: 7.5 mg/dL — ABNORMAL LOW (ref 8.9–10.3)
Chloride: 108 mmol/L (ref 101–111)
Creatinine, Ser: 1.09 mg/dL — ABNORMAL HIGH (ref 0.44–1.00)
GFR calc Af Amer: 57 mL/min — ABNORMAL LOW (ref >60)
GFR calc non Af Amer: 49 mL/min — ABNORMAL LOW (ref >60)
Glucose, Bld: 116 mg/dL — ABNORMAL HIGH (ref 65–99)
Potassium: 4.3 mmol/L (ref 3.5–5.1)
Sodium: 139 mmol/L (ref 135–145)

## 2016-03-02 SURGERY — ARTHROPLASTY, HIP, TOTAL, ANTERIOR APPROACH
Anesthesia: General | Laterality: Left

## 2016-03-02 MED ORDER — FENTANYL CITRATE (PF) 100 MCG/2ML IJ SOLN: 25.0000 ug | INTRAMUSCULAR | Status: DC | PRN

## 2016-03-02 MED ORDER — ONDANSETRON HCL 4 MG/2ML IJ SOLN: 4.0000 mg | Freq: Four times a day (QID) | INTRAMUSCULAR | Status: DC | PRN

## 2016-03-02 MED ORDER — ACETAMINOPHEN 325 MG PO TABS: 650.0000 mg | ORAL_TABLET | Freq: Four times a day (QID) | ORAL | Status: DC | PRN

## 2016-03-02 MED ORDER — LIDOCAINE 2% (20 MG/ML) 5 ML SYRINGE
INTRAMUSCULAR | Status: DC | PRN
  Administered 2016-03-02: 20 mg via INTRAVENOUS

## 2016-03-02 MED ORDER — FENTANYL CITRATE (PF) 100 MCG/2ML IJ SOLN
INTRAMUSCULAR | Status: AC
  Filled 2016-03-02: qty 2

## 2016-03-02 MED ORDER — LACTATED RINGERS IV SOLN
INTRAVENOUS | Status: DC
  Administered 2016-03-02: 09:00:00 via INTRAVENOUS

## 2016-03-02 MED ORDER — ONDANSETRON HCL 4 MG PO TABS: 4.0000 mg | ORAL_TABLET | Freq: Four times a day (QID) | ORAL | Status: DC | PRN

## 2016-03-02 MED ORDER — SENNA 8.6 MG PO TABS
1.0000 | ORAL_TABLET | Freq: Two times a day (BID) | ORAL | Status: DC
  Administered 2016-03-02 – 2016-03-05 (×6): 8.6 mg via ORAL
  Filled 2016-03-02 (×6): qty 1

## 2016-03-02 MED ORDER — CEFAZOLIN SODIUM-DEXTROSE 2-4 GM/100ML-% IV SOLN
2.0000 g | Freq: Four times a day (QID) | INTRAVENOUS | Status: AC
  Administered 2016-03-02: 2 g via INTRAVENOUS
  Filled 2016-03-02 (×2): qty 100

## 2016-03-02 MED ORDER — METOCLOPRAMIDE HCL 5 MG PO TABS: 5.0000 mg | ORAL_TABLET | Freq: Three times a day (TID) | ORAL | Status: DC | PRN

## 2016-03-02 MED ORDER — ASPIRIN EC 81 MG PO TBEC
81.0000 mg | DELAYED_RELEASE_TABLET | Freq: Two times a day (BID) | ORAL | Status: DC
  Administered 2016-03-02 – 2016-03-05 (×7): 81 mg via ORAL
  Filled 2016-03-02 (×7): qty 1

## 2016-03-02 MED ORDER — ACETAMINOPHEN 650 MG RE SUPP: 650.0000 mg | Freq: Four times a day (QID) | RECTAL | Status: DC | PRN

## 2016-03-02 MED ORDER — LACTATED RINGERS IV SOLN
INTRAVENOUS | Status: DC | PRN
  Administered 2016-03-02: 09:00:00 via INTRAVENOUS

## 2016-03-02 MED ORDER — SODIUM CHLORIDE 0.9 % IV SOLN
INTRAVENOUS | Status: DC | PRN
  Administered 2016-03-02: 11:00:00 via INTRAVENOUS

## 2016-03-02 MED ORDER — PROPOFOL 10 MG/ML IV BOLUS
INTRAVENOUS | Status: DC | PRN
  Administered 2016-03-02: 90 mg via INTRAVENOUS

## 2016-03-02 MED ORDER — MENTHOL 3 MG MT LOZG
1.0000 | LOZENGE | OROMUCOSAL | Status: DC | PRN
  Filled 2016-03-02: qty 9

## 2016-03-02 MED ORDER — PHENOL 1.4 % MT LIQD: 1.0000 | OROMUCOSAL | Status: DC | PRN

## 2016-03-02 MED ORDER — DEXAMETHASONE SODIUM PHOSPHATE 10 MG/ML IJ SOLN
INTRAMUSCULAR | Status: DC | PRN
  Administered 2016-03-02: 10 mg via INTRAVENOUS

## 2016-03-02 MED ORDER — CEFAZOLIN SODIUM-DEXTROSE 2-3 GM-% IV SOLR
INTRAVENOUS | Status: DC | PRN
  Administered 2016-03-02: 2 g via INTRAVENOUS

## 2016-03-02 MED ORDER — SODIUM CHLORIDE 0.9 % IV SOLN
INTRAVENOUS | Status: DC | PRN
  Administered 2016-03-02: 1000 mg via INTRAVENOUS

## 2016-03-02 MED ORDER — WHITE PETROLATUM GEL
Status: AC
  Filled 2016-03-02: qty 1

## 2016-03-02 MED ORDER — METHOCARBAMOL 500 MG PO TABS: 500.0000 mg | ORAL_TABLET | Freq: Four times a day (QID) | ORAL | Status: DC | PRN

## 2016-03-02 MED ORDER — MAGNESIUM SULFATE 2 GM/50ML IV SOLN
2.0000 g | Freq: Once | INTRAVENOUS | Status: AC
  Administered 2016-03-02: 2 g via INTRAVENOUS
  Filled 2016-03-02: qty 50

## 2016-03-02 MED ORDER — METOCLOPRAMIDE HCL 5 MG/ML IJ SOLN: 5.0000 mg | Freq: Three times a day (TID) | INTRAMUSCULAR | Status: DC | PRN

## 2016-03-02 MED ORDER — KETOROLAC TROMETHAMINE 30 MG/ML IJ SOLN
INTRAMUSCULAR | Status: AC
  Filled 2016-03-02: qty 1

## 2016-03-02 MED ORDER — METHOCARBAMOL 1000 MG/10ML IJ SOLN
500.0000 mg | Freq: Four times a day (QID) | INTRAVENOUS | Status: DC | PRN
  Filled 2016-03-02: qty 5

## 2016-03-02 MED ORDER — OXYCODONE HCL 5 MG/5ML PO SOLN: 5.0000 mg | Freq: Once | ORAL | Status: DC | PRN

## 2016-03-02 MED ORDER — SUGAMMADEX SODIUM 200 MG/2ML IV SOLN
INTRAVENOUS | Status: AC
  Filled 2016-03-02: qty 2

## 2016-03-02 MED ORDER — ONDANSETRON HCL 4 MG/2ML IJ SOLN
INTRAMUSCULAR | Status: DC | PRN
  Administered 2016-03-02: 4 mg via INTRAVENOUS

## 2016-03-02 MED ORDER — ROCURONIUM BROMIDE 10 MG/ML (PF) SYRINGE
PREFILLED_SYRINGE | INTRAVENOUS | Status: AC
  Filled 2016-03-02: qty 10

## 2016-03-02 MED ORDER — ROCURONIUM BROMIDE 10 MG/ML (PF) SYRINGE
PREFILLED_SYRINGE | INTRAVENOUS | Status: DC | PRN
  Administered 2016-03-02: 40 mg via INTRAVENOUS

## 2016-03-02 MED ORDER — DOCUSATE SODIUM 100 MG PO CAPS
100.0000 mg | ORAL_CAPSULE | Freq: Two times a day (BID) | ORAL | Status: DC
  Administered 2016-03-02 – 2016-03-05 (×6): 100 mg via ORAL
  Filled 2016-03-02 (×6): qty 1

## 2016-03-02 MED ORDER — SUCCINYLCHOLINE CHLORIDE 200 MG/10ML IV SOSY
PREFILLED_SYRINGE | INTRAVENOUS | Status: AC
  Filled 2016-03-02: qty 10

## 2016-03-02 MED ORDER — FENTANYL CITRATE (PF) 100 MCG/2ML IJ SOLN
INTRAMUSCULAR | Status: DC | PRN
  Administered 2016-03-02 (×2): 25 ug via INTRAVENOUS
  Administered 2016-03-02: 100 ug via INTRAVENOUS
  Administered 2016-03-02: 25 ug via INTRAVENOUS
  Administered 2016-03-02: 100 ug via INTRAVENOUS
  Administered 2016-03-02: 25 ug via INTRAVENOUS

## 2016-03-02 MED ORDER — ONDANSETRON HCL 4 MG/2ML IJ SOLN
4.0000 mg | Freq: Once | INTRAMUSCULAR | Status: DC | PRN
Start: 2016-03-02 — End: 2016-03-02

## 2016-03-02 MED ORDER — LIDOCAINE 2% (20 MG/ML) 5 ML SYRINGE
INTRAMUSCULAR | Status: AC
  Filled 2016-03-02: qty 5

## 2016-03-02 MED ORDER — CEFAZOLIN SODIUM-DEXTROSE 2-4 GM/100ML-% IV SOLN
INTRAVENOUS | Status: AC
  Filled 2016-03-02: qty 100

## 2016-03-02 MED ORDER — OXYCODONE HCL 5 MG PO TABS: 5.0000 mg | ORAL_TABLET | Freq: Once | ORAL | Status: DC | PRN

## 2016-03-02 MED ORDER — 0.9 % SODIUM CHLORIDE (POUR BTL) OPTIME
TOPICAL | Status: DC | PRN
  Administered 2016-03-02: 1000 mL

## 2016-03-02 MED ORDER — MORPHINE SULFATE (PF) 2 MG/ML IV SOLN: 0.5000 mg | INTRAVENOUS | Status: DC | PRN

## 2016-03-02 MED ORDER — DEXAMETHASONE SODIUM PHOSPHATE 10 MG/ML IJ SOLN
INTRAMUSCULAR | Status: AC
  Filled 2016-03-02: qty 1

## 2016-03-02 MED ORDER — BUPIVACAINE-EPINEPHRINE (PF) 0.25% -1:200000 IJ SOLN
INTRAMUSCULAR | Status: AC
  Filled 2016-03-02: qty 30

## 2016-03-02 MED ORDER — ONDANSETRON HCL 4 MG/2ML IJ SOLN
INTRAMUSCULAR | Status: AC
  Filled 2016-03-02: qty 2

## 2016-03-02 MED ORDER — SUGAMMADEX SODIUM 200 MG/2ML IV SOLN
INTRAVENOUS | Status: DC | PRN
  Administered 2016-03-02: 100 mg via INTRAVENOUS

## 2016-03-02 MED ORDER — HYDROCODONE-ACETAMINOPHEN 5-325 MG PO TABS
1.0000 | ORAL_TABLET | Freq: Four times a day (QID) | ORAL | Status: DC | PRN
  Administered 2016-03-04 – 2016-03-05 (×3): 2 via ORAL
  Filled 2016-03-02 (×3): qty 2

## 2016-03-02 SURGICAL SUPPLY — 57 items
ADH SKN CLS APL DERMABOND .7 (GAUZE/BANDAGES/DRESSINGS) ×2
ALCOHOL ISOPROPYL (RUBBING) (MISCELLANEOUS) ×2 IMPLANT
BLADE SURG ROTATE 9660 (MISCELLANEOUS) IMPLANT
CAPT HIP TOTAL 2 ×1 IMPLANT
CHLORAPREP W/TINT 26ML (MISCELLANEOUS) ×2 IMPLANT
COVER SURGICAL LIGHT HANDLE (MISCELLANEOUS) ×2 IMPLANT
DERMABOND ADVANCED (GAUZE/BANDAGES/DRESSINGS) ×2
DERMABOND ADVANCED .7 DNX12 (GAUZE/BANDAGES/DRESSINGS) ×2 IMPLANT
DRAPE C-ARM 42X72 X-RAY (DRAPES) ×2 IMPLANT
DRAPE IMP U-DRAPE 54X76 (DRAPES) ×4 IMPLANT
DRAPE STERI IOBAN 125X83 (DRAPES) ×2 IMPLANT
DRAPE U-SHAPE 47X51 STRL (DRAPES) ×6 IMPLANT
DRSG AQUACEL AG ADV 3.5X 6 (GAUZE/BANDAGES/DRESSINGS) ×1 IMPLANT
DRSG AQUACEL AG ADV 3.5X10 (GAUZE/BANDAGES/DRESSINGS) ×2 IMPLANT
ELECT BLADE 4.0 EZ CLEAN MEGAD (MISCELLANEOUS) ×2
ELECT REM PT RETURN 9FT ADLT (ELECTROSURGICAL) ×2
ELECTRODE BLDE 4.0 EZ CLN MEGD (MISCELLANEOUS) ×1 IMPLANT
ELECTRODE REM PT RTRN 9FT ADLT (ELECTROSURGICAL) ×1 IMPLANT
EVACUATOR 1/8 PVC DRAIN (DRAIN) IMPLANT
GLOVE BIO SURGEON STRL SZ8.5 (GLOVE) ×4 IMPLANT
GLOVE BIOGEL PI IND STRL 8.5 (GLOVE) ×1 IMPLANT
GLOVE BIOGEL PI INDICATOR 8.5 (GLOVE) ×1
GOWN STRL REUS W/ TWL LRG LVL3 (GOWN DISPOSABLE) ×2 IMPLANT
GOWN STRL REUS W/TWL 2XL LVL3 (GOWN DISPOSABLE) ×2 IMPLANT
GOWN STRL REUS W/TWL LRG LVL3 (GOWN DISPOSABLE) ×4
HANDPIECE INTERPULSE COAX TIP (DISPOSABLE) ×2
HOOD PEEL AWAY FACE SHEILD DIS (HOOD) ×4 IMPLANT
KIT BASIN OR (CUSTOM PROCEDURE TRAY) ×2 IMPLANT
KIT ROOM TURNOVER OR (KITS) ×2 IMPLANT
MANIFOLD NEPTUNE II (INSTRUMENTS) ×2 IMPLANT
MARKER SKIN DUAL TIP RULER LAB (MISCELLANEOUS) ×4 IMPLANT
NDL SPNL 18GX3.5 QUINCKE PK (NEEDLE) ×1 IMPLANT
NEEDLE SPNL 18GX3.5 QUINCKE PK (NEEDLE) ×2 IMPLANT
NS IRRIG 1000ML POUR BTL (IV SOLUTION) ×2 IMPLANT
PACK TOTAL JOINT (CUSTOM PROCEDURE TRAY) ×2 IMPLANT
PACK UNIVERSAL I (CUSTOM PROCEDURE TRAY) ×2 IMPLANT
PAD ARMBOARD 7.5X6 YLW CONV (MISCELLANEOUS) ×4 IMPLANT
SAW OSC TIP CART 19.5X105X1.3 (SAW) ×2 IMPLANT
SEALER BIPOLAR AQUA 6.0 (INSTRUMENTS) IMPLANT
SET HNDPC FAN SPRY TIP SCT (DISPOSABLE) ×1 IMPLANT
SOLUTION BETADINE 4OZ (MISCELLANEOUS) ×2 IMPLANT
SUCTION FRAZIER HANDLE 10FR (MISCELLANEOUS) ×1
SUCTION TUBE FRAZIER 10FR DISP (MISCELLANEOUS) ×1 IMPLANT
SUT ETHIBOND NAB CT1 #1 30IN (SUTURE) ×4 IMPLANT
SUT MNCRL AB 3-0 PS2 18 (SUTURE) ×2 IMPLANT
SUT MON AB 2-0 CT1 36 (SUTURE) ×2 IMPLANT
SUT VIC AB 1 CT1 27 (SUTURE) ×2
SUT VIC AB 1 CT1 27XBRD ANBCTR (SUTURE) ×1 IMPLANT
SUT VIC AB 2-0 CT1 27 (SUTURE) ×2
SUT VIC AB 2-0 CT1 TAPERPNT 27 (SUTURE) ×1 IMPLANT
SUT VLOC 180 0 24IN GS25 (SUTURE) ×2 IMPLANT
SYR 50ML LL SCALE MARK (SYRINGE) ×2 IMPLANT
TOWEL OR 17X24 6PK STRL BLUE (TOWEL DISPOSABLE) ×2 IMPLANT
TOWEL OR 17X26 10 PK STRL BLUE (TOWEL DISPOSABLE) ×2 IMPLANT
TRAY CATH 16FR W/PLASTIC CATH (SET/KITS/TRAYS/PACK) IMPLANT
TRAY FOLEY CATH 16FR SILVER (SET/KITS/TRAYS/PACK) IMPLANT
WATER STERILE IRR 1000ML POUR (IV SOLUTION) ×6 IMPLANT

## 2016-03-02 NOTE — Progress Notes (Signed)
PROGRESS NOTE                                                                                                                                                                                                             Patient Demographics:    Abigail Hoover, is a 70 y.o. female, DOB - [birth year], LGX:[id number]  Admit date - 02/29/2016   Admitting Physician Norval Morton, MD  Outpatient Primary MD for the patient is [name], MD  LOS - 2  Chief Complaint  Patient presents with  . Hip Pain       Brief Narrative    Abigail Hoover is a 70 y.o. female with medical history significant of HTN, HLD, DM type 2, and anemia;  who presents after having a fall at home complaining of left hip pain. Tonight the patient had been trying to sit at the edge of the bed, but had missed the bed onto her left hip. Patient noted acute left hip pain for which she was unable to stand or bear weight on the affected leg. Rated pain as a 8 out of 10 on the pain scale. 8 days ago the patient had undergone right arthroscopic knee surgery by Dr. Veverly Fells and had been at home recovering well per family prior to the event tonight.    Subjective:    Abigail Hoover today has, No headache, No chest pain, No abdominal pain - No Nausea, No new weakness tingling or numbness, No Cough - SOB.     Assessment  & Plan :     1.Mechanical fall with right displaced left femoral neck fracture. Seen by orthopedics, due for surgery on 03/02/2016 due to hyperkalemia On the day of admission, patient is a moderate risk for adverse cardiopulmonary outcome during the perioperative period, risks and benefits explained to the patient and husband both accepted and wanted to proceed with surgery tomorrow. We'll defer weightbearing and DVT prophylaxis to orthopedics.  Discussed her case with her cardiologist Dr. Terrence Dupont, she underwent left heart cath last year, echocardiogram  last year, coronaries and EF preserved, stress test a few months ago was unremarkable as well.   2. Hyperkalemia. In the setting of ARB use, hold ARB, resolved after Kayexalate and bowel regimen.  3. ARF. Likely due to dehydration and ARB use, hold ARB, renal function much better and  close to normal and was gently hydrated, renal ultrasound stable.  4. Hypothyroidism. Continue Synthroid.  5. GERD. On PPI.  6. Hypertension. On Lopressor and Imdur continue, as needed IV Lopressor.  7. DM type II. On sliding scale.  Lab Results  Component Value Date   HGBA1C 5.8 01/19/2016   CBG (last 3)   Recent Labs  03/02/16 0001 03/02/16 0520 03/02/16 0859  GLUCAP 227* 124* 153*    Family Communication  :  husband  Code Status : Full  Diet : Diet NPO time specified Except for: Sips with Meds   Disposition Plan  :  TBD  Consults  :  Ortho  Procedures  :  None  DVT Prophylaxis  :  SCD only per Ortho  Lab Results  Component Value Date   PLT 202 03/02/2016    Inpatient Medications  Scheduled Meds: . [MAR Hold] aspirin EC  81 mg Oral Daily  . [MAR Hold] bisacodyl  10 mg Rectal Daily  . [MAR Hold] gabapentin  300 mg Oral QID  . [MAR Hold] insulin aspart  0-9 Units Subcutaneous Q6H  . [MAR Hold] isosorbide mononitrate  60 mg Oral Daily  . [MAR Hold] levothyroxine  75 mcg Oral QAC breakfast  . [MAR Hold] magnesium sulfate 1 - 4 g bolus IVPB  2 g Intravenous Once  . [MAR Hold] metoprolol succinate  25 mg Oral Daily  . [MAR Hold] mirabegron ER  50 mg Oral Daily  . [MAR Hold] pantoprazole  40 mg Oral Daily  . [MAR Hold] pentosan polysulfate  100 mg Oral TID AC  . [MAR Hold] sorbitol, milk of mag, mineral oil, glycerin (SMOG) enema  960 mL Rectal Once   Continuous Infusions: . sodium chloride 75 mL/hr at 03/02/16 0548   PRN Meds:.[MAR Hold] HYDROcodone-acetaminophen, [MAR Hold] metoprolol, [MAR Hold] nitroGLYCERIN  Antibiotics  :    Anti-infectives    Start     Dose/Rate  Route Frequency Ordered Stop   03/01/16 0730  ceFAZolin (ANCEF) IVPB 2g/100 mL premix     2 g 200 mL/hr over 30 Minutes Intravenous To [facility] 03/01/16 0707 03/02/16 0730         Objective:   Vitals:   03/01/16 1258 03/01/16 2049 03/02/16 0503 03/02/16 0845  BP: (!) 151/65 (!) 142/51 (!) 148/59 (!) 156/86  Pulse: 86 95 93 (!) 103  Resp: 20 20 18 18   Temp: 98.1 F (36.7 C) 98 F (36.7 C) 98.2 F (36.8 C) 98.3 F (36.8 C)  TempSrc: Oral Oral Oral Oral  SpO2: 100% 100% 97% 97%  Weight:      Height:        Wt Readings from Last 3 Encounters:  02/29/16 51.3 kg (113 lb)  01/30/16 52.2 kg (115 lb 1.6 oz)  01/25/16 50.8 kg (112 lb)     Intake/Output Summary (Last 24 hours) at 03/02/16 0924 Last data filed at 03/01/16 1800  Gross per 24 hour  Intake              720 ml  Output              150 ml  Net              570 ml     Physical Exam  Awake Alert, Oriented X 3, No new F.N deficits, Normal affect [place].AT,PERRAL Supple Neck,No JVD, No cervical lymphadenopathy appriciated.  Symmetrical Chest wall movement, Good air movement bilaterally, CTAB RRR,No Gallops,Rubs or new Murmurs, No Parasternal  Heave +ve B.Sounds, Abd Soft, No tenderness, No organomegaly appriciated, No rebound - guarding or rigidity. No Cyanosis, Clubbing or edema, No new Rash or bruise Left leg externally rotated, slightly tender in the left hip area on palpation      Data Review:    CBC  Recent Labs Lab 02/29/16 2034 03/01/16 0639 03/02/16 0359  WBC 10.1 10.9* 7.4  HGB 12.3 10.8* 10.6*  HCT 40.4 35.8* 34.9*  PLT 216 200 202  MCV 80.2 79.7 80.6  MCH 24.4* 24.1* 24.5*  MCHC 30.4 30.2 30.4  RDW 16.4* 16.2* 16.7*  LYMPHSABS 1.9  --   --   MONOABS 0.4  --   --   EOSABS 0.3  --   --   BASOSABS 0.0  --   --     Chemistries   Recent Labs Lab 02/29/16 2034 03/01/16 0639 03/01/16 1042 03/02/16 0359 03/02/16 0733  NA 137 136  --  139  --   K 5.8* 5.9* 4.9 4.3  --   CL  104 107  --  108  --   CO2 24 20*  --  25  --   GLUCOSE 165* 171*  --  116*  --   BUN 34* 27*  --  17  --   CREATININE 1.27* 1.07*  --  1.09*  --   CALCIUM 9.5 8.4*  --  7.5*  --   MG  --   --   --  1.4*  --   AST  --   --   --   --  47*  ALT  --   --   --   --  28  ALKPHOS  --   --   --   --  56  BILITOT  --   --   --   --  0.8   ------------------------------------------------------------------------------------------------------------------ No results for input(s): CHOL, HDL, LDLCALC, TRIG, CHOLHDL, LDLDIRECT in the last 72 hours.  Lab Results  Component Value Date   HGBA1C 5.8 01/19/2016   ------------------------------------------------------------------------------------------------------------------ No results for input(s): TSH, T4TOTAL, T3FREE, THYROIDAB in the last 72 hours.  Invalid input(s): FREET3 ------------------------------------------------------------------------------------------------------------------ No results for input(s): VITAMINB12, FOLATE, FERRITIN, TIBC, IRON, RETICCTPCT in the last 72 hours.  Coagulation profile  Recent Labs Lab 02/29/16 2034  INR 0.94    No results for input(s): DDIMER in the last 72 hours.  Cardiac Enzymes  Recent Labs Lab 02/29/16 2303  TROPONINI <0.03   ------------------------------------------------------------------------------------------------------------------ No results found for: BNP  Micro Results  Recent Results (from the past 240 hour(s))  Surgical PCR screen     Status: None   Collection Time: 03/01/16  4:18 AM  Result Value Ref Range Status   MRSA, PCR NEGATIVE NEGATIVE Final   Staphylococcus aureus NEGATIVE NEGATIVE Final    Comment:        The Xpert SA Assay (FDA approved for NASAL specimens in patients over 52 years of age), is one component of a comprehensive surveillance program.  Test performance has been validated by [facility] for patients greater than or equal to 33 year old. It is  not intended to diagnose infection nor to guide or monitor treatment.     Radiology Reports  Dg Elbow Complete Left (3+view)  Result Date: 03/01/2016 CLINICAL DATA:  Left elbow pain with fall from bed, hip fx, concern for elbow fx as well EXAM: LEFT ELBOW - COMPLETE 3+ VIEW COMPARISON:  01/09/2009 FINDINGS: There is no evidence of fracture, dislocation, or joint effusion.  Soft tissues are unremarkable. No joint effusion. Bones appear osteopenic. IMPRESSION: No evidence for acute  abnormality. Electronically Signed   By: Nolon Nations M.D.   On: 03/01/2016 09:32   US Renal  Result Date: 02/14/2016 CLINICAL DATA:  Hyponatremia, hypertension, diabetes EXAM: RENAL / URINARY TRACT ULTRASOUND COMPLETE COMPARISON:  None. FINDINGS: Right Kidney: Length: 8.3 centimeter. No hydronephrosis is seen. The echogenicity of the renal parenchyma is somewhat increased. Left Kidney: Length: 8.8 centimeter. No hydronephrosis is noted. The parenchyma of the kidney also is somewhat increased suggesting possible chronic renal medical disease. Correlation with renal laboratory values is recommended. Bladder: The urinary bladder is not well distended but no gross abnormality is evident. IMPRESSION: 1. No hydronephrosis. 2. Somewhat echogenic renal parenchyma suggests chronic renal medical disease. Correlate with renal laboratory values appear Electronically Signed   By: Ivar Drape M.D.   On: 02/14/2016 07:39   Dg Chest Portable 1 View  Result Date: 02/29/2016 CLINICAL DATA:  Left hip fracture. EXAM: PORTABLE CHEST 1 VIEW COMPARISON:  01/02/2015 FINDINGS: A single AP portable view of the chest demonstrates no focal airspace consolidation or alveolar edema. The lungs are grossly clear. There is no large effusion or pneumothorax. Cardiac and mediastinal contours appear unremarkable. IMPRESSION: No active disease. Electronically Signed   By: Andreas Newport M.D.   On: 02/29/2016 22:53   Dg Shoulder Left  Result Date:  03/01/2016 CLINICAL DATA:  LEFT shoulder pain and swelling after fall today. History of arthritis. EXAM: LEFT SHOULDER - 2+ VIEW COMPARISON:  None. FINDINGS: There is no evidence of fracture or dislocation. There is no evidence of advanced arthropathy or other focal bone abnormality. Mild spurring of the humeral head. Osteopenia. Soft tissues are unremarkable. IMPRESSION: No acute fracture deformity or dislocation. Osteopenia, decreasing sensitivity for acute nondisplaced fractures. Electronically Signed   By: Elon Alas M.D.   On: 03/01/2016 01:36   Dg Knee Left Port  Result Date: 02/29/2016 CLINICAL DATA:  Left knee pain without trauma.  One year duration. EXAM: PORTABLE LEFT KNEE - 1-2 VIEW COMPARISON:  None. FINDINGS: Negative for acute fracture or dislocation. No bone lesion or bony destruction. No joint effusion. IMPRESSION: Negative. Electronically Signed   By: Andreas Newport M.D.   On: 02/29/2016 23:25   Dg Hip Unilat With Pelvis 2-3 Views Left  Result Date: 02/29/2016 CLINICAL DATA:  Left hip pain after fall today. EXAM: DG HIP (WITH OR WITHOUT PELVIS) 2-3V LEFT COMPARISON:  None. FINDINGS: Displaced left femoral neck fracture with angulation. No significant shortening. Femoral head remains seated in the acetabulum. Pubic rami are intact. No additional acute fracture. IMPRESSION: Displaced left femoral neck fracture with angulation. Electronically Signed   By: Jeb Levering M.D.   On: 02/29/2016 21:53    Time Spent in minutes  30   Johnay Mano K M.D on 03/02/2016 at 9:24 AM  Between 7am to 7pm - Pager - [phone]  After 7pm go to [email or web address] - password [facility]  [facility] -  Office  [phone]

## 2016-03-02 NOTE — Transfer of Care (Signed)
Immediate Anesthesia Transfer of Care Note  Patient: Abigail Hoover  Procedure(s) Performed: Procedure(s): TOTAL HIP ARTHROPLASTY ANTERIOR APPROACH (Left)  Patient Location: PACU  Anesthesia Type:General  Level of Consciousness: awake  Airway & Oxygen Therapy: Patient Spontanous Breathing and Patient connected to nasal cannula oxygen  Post-op Assessment: Report given to RN and Post -op Vital signs reviewed and stable  Post vital signs: Reviewed and stable  Last Vitals:  Vitals:   03/02/16 0503 03/02/16 0845  BP: (!) 148/59 (!) 156/86  Pulse: 93 (!) 103  Resp: 18 18  Temp: 36.8 C 36.8 C    Last Pain:  Vitals:   03/02/16 0845  TempSrc: Oral  PainSc:       Patients Stated Pain Goal: 4 (44/62/86 3817)  Complications: No apparent anesthesia complications

## 2016-03-02 NOTE — H&P (View-Only) (Signed)
 ORTHOPAEDIC CONSULTATION  REQUESTING PHYSICIAN: Rondell A Smith, MD  PCP:  [name],[name], MD  Chief Complaint: Displaced left femoral neck fracture  HPI: Abigail Hoover is a 70 y.o. female with a history of CKD stage III, HTN, HLD, DM type 2, and anemia who complains of left hip pain after she fell at home last night. Patient does have history of right knee arthroscopy about a week ago by Dr. Noris. She complains of left hip pain and she is unable to weight-bear due to left hip pain. She was admitted to the hospitalist service, and orthopedic consultation was requested for management of her hip fracture. She denies other injuries. Upon admission, she was noted to have hyperkalemia. The hospitalist is currently managing this with IV hydration and Kayexalate.   Past Medical History:  Diagnosis Date  . Anemia   . 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 Anxiety   . Arthritis   . Bladder irritation   . Deficiency anemia 05/27/2014  . Diabetes mellitus   . GERD (gastroesophageal reflux disease)   . Hypercholesterolemia   . Hypertension   . Hypothyroidism   . Iron deficiency anemia, unspecified   . Psoriasis   . Thyroid disease    Past Surgical History:  Procedure Laterality Date  . ABDOMINAL HYSTERECTOMY     TAH  . BREAST BIOPSY     Left breast biopsy benign lesion  . BREAST BIOPSY  03/11/2012   Procedure: BREAST BIOPSY;  Surgeon: Todd J Rosenbower, MD;  Location: [facility] OR;  Service: General;  Laterality: Left;  remove left breast mass  . CARDIAC CATHETERIZATION N/A 01/02/2015   Procedure: Left Heart Cath and Coronary Angiography;  Surgeon: Mohan Harwani, MD;  Location: [facility] INVASIVE CV LAB;  Service: Cardiovascular;  Laterality: N/A;  . CATARACT EXTRACTION    . CERVICAL DISC SURGERY    . COLONOSCOPY  2013   neg. next 2023.   . EYE SURGERY     Social History   Social History  . Marital status: Married    Spouse name: N/A  . Number of children: 1  . Years of education:  college   Occupational History  . Homemaker    Social History Main Topics  . Smoking status: Never Smoker  . Smokeless tobacco: Never Used  . Alcohol use No  . Drug use: No  . Sexual activity: Not Asked   Other Topics Concern  . None   Social History Narrative   Patient is married with one child.   Patient is right handed.   Patient has college education.   Patient drinks 1 cup daily.   Family History  Problem Relation Age of Onset  . Heart disease Mother     heart attack  . Stroke Father     brain hemorrhage  . Diabetes Brother   . Cancer Brother   . Breast cancer Sister   . Cancer Sister     breast cancer   Allergies  Allergen Reactions  . Atorvastatin Rash  . Ezetimibe Itching   Prior to Admission medications   Medication Sig Start Date End Date Taking? Authorizing Provider  aspirin 81 MG tablet Take 81 mg by mouth daily.     Yes Historical Provider, MD  Blood Glucose Monitoring Suppl (ONE TOUCH ULTRA 2) W/DEVICE KIT Use to check blood sugars 1 time per day 03/08/13  Yes Ajay Kumar, MD  calcium citrate (CALCITRATE - DOSED IN MG ELEMENTAL CALCIUM) 950 MG tablet Take 200 mg of   elemental calcium by mouth daily. 1/2 tab   Yes Historical Provider, MD  gabapentin (NEURONTIN) 300 MG capsule Take 300 mg by mouth 4 (four) times daily.   Yes Historical Provider, MD  GLYSET 50 MG tablet Take 1 tablet (50 mg total) by mouth 2 (two) times daily. Must Make Office Visit!!! Patient taking differently: Take 50-100 mg by mouth 2 (two) times daily. 50 mg qam and 100 mg qhs 02/26/16  Yes Elayne Snare, MD  ibandronate (BONIVA) 150 MG tablet TAKE 1 TAB ONCE A MONTH IN THE AM ON AN EMPTY STOMACH WITH FULL GLAS OF WATER STAY UPRIGHT 30 MIN 11/08/14  Yes Elayne Snare, MD  isosorbide mononitrate (IMDUR) 60 MG 24 hr tablet Take 1 tablet (60 mg total) by mouth daily. 01/04/15  Yes Charolette Forward, MD  levothyroxine (SYNTHROID, LEVOTHROID) 75 MCG tablet Take 1 tablet (75 mcg total) by mouth daily.  02/04/16  Yes Elayne Snare, MD  losartan (COZAAR) 50 MG tablet Take 50 mg by mouth daily.  10/28/15  Yes Historical Provider, MD  metoprolol succinate (TOPROL-XL) 50 MG 24 hr tablet Take 1 tablet (50 mg total) by mouth daily. Take with or immediately following a meal. Patient taking differently: Take 25 mg by mouth daily. Take with or immediately following a meal. 01/04/15  Yes Charolette Forward, MD  MYRBETRIQ 50 MG TB24 tablet Take 50 mg by mouth daily. 12/21/15  Yes Historical Provider, MD  nitroGLYCERIN (NITROSTAT) 0.4 MG SL tablet Place 1 tablet (0.4 mg total) under the tongue every 5 (five) minutes x 3 doses as needed for chest pain. 01/04/15  Yes Charolette Forward, MD  pentosan polysulfate (ELMIRON) 100 MG capsule Take 100 mg by mouth 3 (three) times daily before meals.    Yes Historical Provider, MD  sitaGLIPtin-metformin (JANUMET) 50-500 MG tablet Take 1 tablet by mouth 2 (two) times daily with a meal. 01/25/16  Yes Elayne Snare, MD  GLYSET 50 MG tablet TAKE 1 TABLET BY MOUTH 3 TIMES A DAY *NEED OFFICE VISIT Patient not taking: Reported on 02/29/2016 02/18/16   Elayne Snare, MD   Dg Chest Portable 1 View  Result Date: 02/29/2016 CLINICAL DATA:  Left hip fracture. EXAM: PORTABLE CHEST 1 VIEW COMPARISON:  01/02/2015 FINDINGS: A single AP portable view of the chest demonstrates no focal airspace consolidation or alveolar edema. The lungs are grossly clear. There is no large effusion or pneumothorax. Cardiac and mediastinal contours appear unremarkable. IMPRESSION: No active disease. Electronically Signed   By: Andreas Newport M.D.   On: 02/29/2016 22:53   Dg Shoulder Left  Result Date: 03/01/2016 CLINICAL DATA:  LEFT shoulder pain and swelling after fall today. History of arthritis. EXAM: LEFT SHOULDER - 2+ VIEW COMPARISON:  None. FINDINGS: There is no evidence of fracture or dislocation. There is no evidence of advanced arthropathy or other focal bone abnormality. Mild spurring of the humeral head. Osteopenia.  Soft tissues are unremarkable. IMPRESSION: No acute fracture deformity or dislocation. Osteopenia, decreasing sensitivity for acute nondisplaced fractures. Electronically Signed   By: Elon Alas M.D.   On: 03/01/2016 01:36   Dg Knee Left Port  Result Date: 02/29/2016 CLINICAL DATA:  Left knee pain without trauma.  One year duration. EXAM: PORTABLE LEFT KNEE - 1-2 VIEW COMPARISON:  None. FINDINGS: Negative for acute fracture or dislocation. No bone lesion or bony destruction. No joint effusion. IMPRESSION: Negative. Electronically Signed   By: Andreas Newport M.D.   On: 02/29/2016 23:25   Dg Hip Unilat With Pelvis 2-3  Views Left  Result Date: 02/29/2016 CLINICAL DATA:  Left hip pain after fall today. EXAM: DG HIP (WITH OR WITHOUT PELVIS) 2-3V LEFT COMPARISON:  None. FINDINGS: Displaced left femoral neck fracture with angulation. No significant shortening. Femoral head remains seated in the acetabulum. Pubic rami are intact. No additional acute fracture. IMPRESSION: Displaced left femoral neck fracture with angulation. Electronically Signed   By: Jeb Levering M.D.   On: 02/29/2016 21:53    Positive ROS: All other systems have been reviewed and were otherwise negative with the exception of those mentioned in the HPI and as above.  Physical Exam: General: Alert, no acute distress Cardiovascular: No pedal edema Respiratory: No cyanosis, no use of accessory musculature GI: No organomegaly, abdomen is soft and non-tender Skin: No lesions in the area of chief complaint Neurologic: Sensation intact distally Psychiatric: Patient is competent for consent with normal mood and affect Lymphatic: No axillary or cervical lymphadenopathy  MUSCULOSKELETAL: Examination of the left lower extremity reveals that it is shortened and externally rotated. There are no skin wounds or lesions over her hip. She has pain with attempted logrolling of the hip. She has a palpable pedal pulse. She has positive  motor function dorsiflexion, plantarflexion, and great toe extension. She reports intact sensation to light touch.  Assessment: Displaced left femoral neck fracture  Plan: I discussed the findings with the patient and her husband. Due to her activity level, I recommended left total hip arthroplasty. We discussed the risks benefits, and alternatives. I spoke with Dr. Candiss Norse, the patient's hospitalist, regarding her elevated potassium (5.9 this am). He recommends that we delay surgery until her potassium begins trending down. Therefore, we will plan for surgery tomorrow. Nothing by mouth after midnight. Hold chemical DVT prophylaxis.   The risks, benefits, and alternatives were discussed with the patient. There are risks associated with the surgery including, but not limited to, problems with anesthesia (death), infection, instability (giving out of the joint), dislocation, differences in leg length/angulation/rotation, fracture of bones, loosening or failure of implants, hematoma (blood accumulation) which may require surgical drainage, blood clots, pulmonary embolism, nerve injury (foot drop and lateral thigh numbness), and blood vessel injury. The patient understands these risks and elects to proceed.   Sharni Negron, Horald Pollen, MD Cell [phone]    03/01/2016 8:27 AM

## 2016-03-02 NOTE — Interval H&P Note (Signed)
History and Physical Interval Note:  03/02/2016 9:34 AM  Abigail Hoover  has presented today for surgery, with the diagnosis of Left hip fracture  The various methods of treatment have been discussed with the patient and family. After consideration of risks, benefits and other options for treatment, the patient has consented to  Procedure(s): TOTAL HIP ARTHROPLASTY ANTERIOR APPROACH (Left) as a surgical intervention .  The patient's history has been reviewed, patient examined, no change in status, stable for surgery.  I have reviewed the patient's chart and labs.  Questions were answered to the patient's satisfaction.     Devere Brem, Horald Pollen

## 2016-03-02 NOTE — Anesthesia Postprocedure Evaluation (Signed)
Anesthesia Post Note  Patient: Abigail Hoover  Procedure(s) Performed: Procedure(s) (LRB): TOTAL HIP ARTHROPLASTY ANTERIOR APPROACH (Left)  Patient location during evaluation: PACU Anesthesia Type: General Level of consciousness: awake, awake and alert and oriented Pain management: pain level controlled Vital Signs Assessment: post-procedure vital signs reviewed and stable Respiratory status: spontaneous breathing, nonlabored ventilation, patient connected to nasal cannula oxygen and respiratory function stable Cardiovascular status: blood pressure returned to baseline Anesthetic complications: no    Last Vitals:  Vitals:   03/02/16 1306 03/02/16 1325  BP: (!) 170/86 127/73  Pulse: 91 87  Resp: 20 18  Temp: 36.6 C 36.4 C    Last Pain:  Vitals:   03/02/16 0845  TempSrc: Oral  PainSc:                  [name]

## 2016-03-02 NOTE — Anesthesia Procedure Notes (Signed)
Procedure Name: Intubation Date/Time: 03/02/2016 10:00 AM Performed by: Marinda Elk A Pre-anesthesia Checklist: Patient identified, Emergency Drugs available, Suction available, Patient being monitored and Timeout performed Patient Re-evaluated:Patient Re-evaluated prior to inductionOxygen Delivery Method: Circle System Utilized and Circle system utilized Preoxygenation: Pre-oxygenation with 100% oxygen Intubation Type: IV induction Ventilation: Mask ventilation without difficulty Laryngoscope Size: Glidescope Grade View: Grade I Tube type: Oral Tube size: 7.5 mm Number of attempts: 1 Airway Equipment and Method: Stylet Placement Confirmation: ETT inserted through vocal cords under direct vision,  positive ETCO2 and breath sounds checked- equal and bilateral Secured at: 21 cm Tube secured with: Tape Dental Injury: Teeth and Oropharynx as per pre-operative assessment

## 2016-03-02 NOTE — Discharge Instructions (Signed)
°Dr. Saladin Petrelli °Joint Replacement Specialist °[facility] °[address] °[place], [place] [address] °[phone] ° ° °TOTAL HIP REPLACEMENT POSTOPERATIVE DIRECTIONS ° ° ° °Hip Rehabilitation, Guidelines Following Surgery  ° °WEIGHT BEARING °Weight bearing as tolerated with assist device (walker, cane, etc) as directed, use it as long as suggested by your surgeon or therapist, typically at least 4-6 weeks. ° °The results of a hip operation are greatly improved after range of motion and muscle strengthening exercises. Follow all safety measures which are given to protect your hip. If any of these exercises cause increased pain or swelling in your joint, decrease the amount until you are comfortable again. Then slowly increase the exercises. Call your caregiver if you have problems or questions.  ° °HOME CARE INSTRUCTIONS  °Most of the following instructions are designed to prevent the dislocation of your new hip.  °Remove items at home which could result in a fall. This includes throw rugs or furniture in walking pathways.  °Continue medications as instructed at time of discharge. °· You may have some home medications which will be placed on hold until you complete the course of blood thinner medication. °· You may start showering once you are discharged home. Do not remove your dressing. °Do not put on socks or shoes without following the instructions of your caregivers.   °Sit on chairs with arms. Use the chair arms to help push yourself up when arising.  °Arrange for the use of a toilet seat elevator so you are not sitting low.  °· Walk with walker as instructed.  °You may resume a sexual relationship in one month or when given the OK by your caregiver.  °Use walker as long as suggested by your caregivers.  °You may put full weight on your legs and walk as much as is comfortable. °Avoid periods of inactivity such as sitting longer than an hour when not asleep. This helps prevent  blood clots.  °You may return to work once you are cleared by your surgeon.  °Do not drive a car for 6 weeks or until released by your surgeon.  °Do not drive while taking narcotics.  °Wear elastic stockings for two weeks following surgery during the day but you may remove then at night.  °Make sure you keep all of your appointments after your operation with all of your doctors and caregivers. You should call the office at the above phone number and make an appointment for approximately two weeks after the date of your surgery. °Please pick up a stool softener and laxative for home use as long as you are requiring pain medications. °· ICE to the affected hip every three hours for 30 minutes at a time and then as needed for pain and swelling. Continue to use ice on the hip for pain and swelling from surgery. You may notice swelling that will progress down to the foot and ankle.  This is normal after surgery.  Elevate the leg when you are not up walking on it.   °It is important for you to complete the blood thinner medication as prescribed by your doctor. °· Continue to use the breathing machine which will help keep your temperature down.  It is common for your temperature to cycle up and down following surgery, especially at night when you are not up moving around and exerting yourself.  The breathing machine keeps your lungs expanded and your temperature down. ° °RANGE OF MOTION AND STRENGTHENING EXERCISES  °These exercises are   designed to help you keep full movement of your hip joint. Follow your caregiver's or physical therapist's instructions. Perform all exercises about fifteen times, three times per day or as directed. Exercise both hips, even if you have had only one joint replacement. These exercises can be done on a training (exercise) mat, on the floor, on a table or on a bed. Use whatever works the best and is most comfortable for you. Use music or television while you are exercising so that the exercises  are a pleasant break in your day. This will make your life better with the exercises acting as a break in routine you can look forward to.  °Lying on your back, slowly slide your foot toward your buttocks, raising your knee up off the floor. Then slowly slide your foot back down until your leg is straight again.  °Lying on your back spread your legs as far apart as you can without causing discomfort.  °Lying on your side, raise your upper leg and foot straight up from the floor as far as is comfortable. Slowly lower the leg and repeat.  °Lying on your back, tighten up the muscle in the front of your thigh (quadriceps muscles). You can do this by keeping your leg straight and trying to raise your heel off the floor. This helps strengthen the largest muscle supporting your knee.  °Lying on your back, tighten up the muscles of your buttocks both with the legs straight and with the knee bent at a comfortable angle while keeping your heel on the floor.  ° °SKILLED REHAB INSTRUCTIONS: °If the patient is transferred to a skilled rehab facility following release from the hospital, a list of the current medications will be sent to the facility for the patient to continue.  When discharged from the skilled rehab facility, please have the facility set up the patient's Home Health Physical Therapy prior to being released. Also, the skilled facility will be responsible for providing the patient with their medications at time of release from the facility to include their pain medication and their blood thinner medication. If the patient is still at the rehab facility at time of the two week follow up appointment, the skilled rehab facility will also need to assist the patient in arranging follow up appointment in our office and any transportation needs. ° °MAKE SURE YOU:  °Understand these instructions.  °Will watch your condition.  °Will get help right away if you are not doing well or get worse. ° °Pick up stool softner and  laxative for home use following surgery while on pain medications. °Do not remove your dressing. °The dressing is waterproof--it is OK to take showers. °Continue to use ice for pain and swelling after surgery. °Do not use any lotions or creams on the incision until instructed by your surgeon. °Total Hip Protocol. ° ° °

## 2016-03-02 NOTE — Op Note (Signed)
OPERATIVE REPORT  SURGEON: Rod Can, MD   ASSISTANT: Staff.  PREOPERATIVE DIAGNOSIS: Displaced Left femoral neck fracture.   POSTOPERATIVE DIAGNOSIS: Displaced Left femoral neck fracture.   PROCEDURE: Left total hip arthroplasty, anterior approach.   IMPLANTS: DePuy Tri Lock stem, size 2, standard offset. DePuy Pinnacle Cup, size 46 mm. DePuy Altrx liner, size 28 by 46 mm, neutral. DePuy Biolox ceramic head ball, size 28 + 1 mm.  ANESTHESIA:  General  ESTIMATED BLOOD LOSS: 250 mL.  ANTIBIOTICS: 2 g Ancef.  DRAINS: None.  COMPLICATIONS: None.   CONDITION: PACU - hemodynamically stable.   BRIEF CLINICAL NOTE: Abigail Hoover is a 70 y.o. female who presented with a displaced left femoral neck fracture. She did have a recent right knee arthroscopy by Dr. Alma Friendly. She was admitted to the hospitalist service. She has a history of stage III chronic kidney disease, and her potassium was elevated upon admission. She was treated with IV fluids and Kayexalate, and when her potassium normalized, she was indicated for left total hip arthroplasty.  After failing conservative management, the patient was indicated for total hip arthroplasty. The risks, benefits, and alternatives to the procedure were explained, and the patient elected to proceed.  PROCEDURE IN DETAIL: Surgical site was marked by myself. Spinal anesthesia was obtained in the pre-op holding area. Once inside the operative room, a foley catheter was inserted. The patient was then positioned on the Hana table. All bony prominences were well padded. The hip was prepped and draped in the normal sterile surgical fashion. A time-out was called verifying side and site of surgery. The patient received IV antibiotics within 60 minutes of beginning the procedure.  The direct anterior approach to the hip was performed through the Hueter interval. Lateral femoral circumflex vessels were treated with the Auqumantys. The anterior  capsule was exposed and an inverted T capsulotomy was made.The femoral neck cut was made to the level of the templated cut. A corkscrew was placed into the head and the head was removed. The femoral head was found to have eburnated bone. The head was passed to the back table and was measured.  Acetabular exposure was achieved, and the pulvinar and labrum were excised. Sequental reaming of the acetabulum was then performed up to a size 45 mm reamer. A 46 mm cup was then opened and impacted into place at approximately 40 degrees of abduction and 20 degrees of anteversion. The final polyethylene liner was impacted into place and acetabular osteophytes were removed.   I then gained femoral exposure taking care to protect the abductors and greater trochanter. This was performed using standard external rotation, extension, and adduction. The capsule was peeled off the inner aspect of the greater trochanter, taking care to preserve the short external rotators. A cookie cutter was used to enter the femoral canal, and then the femoral canal finder was placed. Sequential broaching was performed up to a size 2. Calcar planer was used on the femoral neck remnant. I placed a std offset neck and a trial head ball. The hip was reduced. Leg lengths and offset were checked fluoroscopically. The hip was dislocated and trial components were removed. The final implants were placed, and the hip was reduced.  Fluoroscopy was used to confirm component position and leg lengths. At 90 degrees of external rotation and full extension, the hip was stable to an anterior directed force.  The wound was copiously irrigated with a dilute betadine solution followed by normal saline. Marcaine solution was injected into the  periarticular soft tissue. The wound was closed in layers using #1 Vicryl and V-Loc for the fascia, 2-0 Vicryl for the subcutaneous fat, 2-0 Monocryl for the deep dermal layer, 3-0 running Monocryl  subcuticular stitch, and Dermabond for the skin. Once the glue was fully dried, an Aquacell Ag dressing was applied. The patient was transported to the recovery room in stable condition. Sponge, needle, and instrument counts were correct at the end of the case x2. The patient tolerated the procedure well and there were no known complications.  Postoperatively, we will readmit the patient to the hospitalist service. She may weight-bear as tolerated with a walker. We'll place her on aspirin 81 mg by mouth twice a day for 6 weeks for DVT prophylaxis. We will work with PT and OT. We will plan for discharge home once she has cleared physical therapy.

## 2016-03-02 NOTE — Anesthesia Preprocedure Evaluation (Signed)

## 2016-03-03 ENCOUNTER — Encounter (HOSPITAL_COMMUNITY): Payer: Self-pay | Admitting: *Deleted

## 2016-03-03 LAB — COMPREHENSIVE METABOLIC PANEL
ALT: 11 U/L — ABNORMAL LOW (ref 14–54)
AST: 32 U/L (ref 15–41)
Albumin: 2.4 g/dL — ABNORMAL LOW (ref 3.5–5.0)
Alkaline Phosphatase: 51 U/L (ref 38–126)
Anion gap: 6 (ref 5–15)
BUN: 18 mg/dL (ref 6–20)
CO2: 25 mmol/L (ref 22–32)
Calcium: 7.2 mg/dL — ABNORMAL LOW (ref 8.9–10.3)
Chloride: 107 mmol/L (ref 101–111)
Creatinine, Ser: 1.21 mg/dL — ABNORMAL HIGH (ref 0.44–1.00)
GFR calc Af Amer: 51 mL/min — ABNORMAL LOW (ref >60)
GFR calc non Af Amer: 44 mL/min — ABNORMAL LOW (ref >60)
Glucose, Bld: 101 mg/dL — ABNORMAL HIGH (ref 65–99)
Potassium: 4.4 mmol/L (ref 3.5–5.1)
Sodium: 138 mmol/L (ref 135–145)
Total Bilirubin: 0.7 mg/dL (ref 0.3–1.2)
Total Protein: 4.8 g/dL — ABNORMAL LOW (ref 6.5–8.1)

## 2016-03-03 LAB — CBC
HCT: 29 % — ABNORMAL LOW (ref 36.0–46.0)
Hemoglobin: 9.2 g/dL — ABNORMAL LOW (ref 12.0–15.0)
MCH: 24.5 pg — ABNORMAL LOW (ref 26.0–34.0)
MCHC: 31.7 g/dL (ref 30.0–36.0)
MCV: 77.3 fL — ABNORMAL LOW (ref 78.0–100.0)
Platelets: 153 10*3/uL (ref 150–400)
RBC: 3.75 MIL/uL — ABNORMAL LOW (ref 3.87–5.11)
RDW: 16.4 % — ABNORMAL HIGH (ref 11.5–15.5)
WBC: 8.7 10*3/uL (ref 4.0–10.5)

## 2016-03-03 LAB — GLUCOSE, CAPILLARY
Glucose-Capillary: 109 mg/dL — ABNORMAL HIGH (ref 65–99)
Glucose-Capillary: 128 mg/dL — ABNORMAL HIGH (ref 65–99)
Glucose-Capillary: 199 mg/dL — ABNORMAL HIGH (ref 65–99)
Glucose-Capillary: 213 mg/dL — ABNORMAL HIGH (ref 65–99)
Glucose-Capillary: 255 mg/dL — ABNORMAL HIGH (ref 65–99)

## 2016-03-03 LAB — BASIC METABOLIC PANEL WITH GFR
Anion gap: 7 (ref 5–15)
BUN: 19 mg/dL (ref 6–20)
CO2: 24 mmol/L (ref 22–32)
Calcium: 7 mg/dL — ABNORMAL LOW (ref 8.9–10.3)
Chloride: 105 mmol/L (ref 101–111)
Creatinine, Ser: 1.15 mg/dL — ABNORMAL HIGH (ref 0.44–1.00)
GFR calc Af Amer: 54 mL/min — ABNORMAL LOW
GFR calc non Af Amer: 46 mL/min — ABNORMAL LOW
Glucose, Bld: 146 mg/dL — ABNORMAL HIGH (ref 65–99)
Potassium: 4.8 mmol/L (ref 3.5–5.1)
Sodium: 136 mmol/L (ref 135–145)

## 2016-03-03 LAB — CALCIUM, IONIZED: Calcium, Ionized, Serum: 4.3 mg/dL — ABNORMAL LOW (ref 4.5–5.6)

## 2016-03-03 LAB — MAGNESIUM: Magnesium: 2.2 mg/dL (ref 1.7–2.4)

## 2016-03-03 MED ORDER — CALCIUM CITRATE 950 (200 CA) MG PO TABS
200.0000 mg | ORAL_TABLET | Freq: Three times a day (TID) | ORAL | Status: DC
  Administered 2016-03-03 – 2016-03-05 (×9): 200 mg via ORAL
  Filled 2016-03-03 (×10): qty 1

## 2016-03-03 MED ORDER — SODIUM POLYSTYRENE SULFONATE 15 GM/60ML PO SUSP: 7.5000 g | Freq: Once | ORAL | Status: DC

## 2016-03-03 MED ORDER — SODIUM CHLORIDE 0.9 % IV SOLN
1.0000 g | Freq: Once | INTRAVENOUS | Status: AC
  Administered 2016-03-03: 1 g via INTRAVENOUS
  Filled 2016-03-03: qty 10

## 2016-03-03 MED ORDER — CALCIUM CITRATE-VITAMIN D 500-400 MG-UNIT PO CHEW
1.0000 | CHEWABLE_TABLET | Freq: Three times a day (TID) | ORAL | Status: DC
  Filled 2016-03-03: qty 1

## 2016-03-03 MED ORDER — POLYSACCHARIDE IRON COMPLEX 150 MG PO CAPS
150.0000 mg | ORAL_CAPSULE | Freq: Every day | ORAL | Status: DC
  Administered 2016-03-03 – 2016-03-05 (×3): 150 mg via ORAL
  Filled 2016-03-03 (×4): qty 1

## 2016-03-03 MED ORDER — CHOLECALCIFEROL 10 MCG (400 UNIT) PO TABS
400.0000 [IU] | ORAL_TABLET | Freq: Three times a day (TID) | ORAL | Status: DC
  Administered 2016-03-03 – 2016-03-05 (×8): 400 [IU] via ORAL
  Filled 2016-03-03 (×8): qty 1

## 2016-03-03 NOTE — Progress Notes (Addendum)
PROGRESS NOTE                                                                                                                                                                                                             Patient Demographics:    Abigail Hoover, is a 70 y.o. female, DOB - 28-Apr-1945, SWN:[id number]  Admit date - 02/29/2016   Admitting Physician Norval Morton, MD  Outpatient Primary MD for the patient is [name], MD  LOS - 3  Chief Complaint  Patient presents with  . Hip Pain       Brief Narrative    Abigail Hoover is a 70 y.o. female with medical history significant of HTN, HLD, DM type 2, and anemia;  who presents after having a fall at home complaining of left hip pain. Tonight the patient had been trying to sit at the edge of the bed, but had missed the bed onto her left hip.   She was found to have left femoral neck fracture was seen by Dr. Lyla Glassing underwent ORIF left hip 03/02/2016, initiate PT will require placement.   Subjective:    Abigail Hoover today has, No headache, No chest pain, No abdominal pain - No Nausea, No new weakness tingling or numbness, No Cough - SOB.  No left hip pain.   Assessment  & Plan :     1.Mechanical fall with right displaced left femoral neck fracture. Seen by orthopedics, post ORIF by Dr. Lyla Glassing on 03/02/2016. Tolerated procedure well, minimal perioperative blood loss treated anemia, weightbearing as tolerated on left leg per ortho and aspirin 81 twice a day for DVT prophylaxis per Ortho, initiate PT may require placement.  2. Hyperkalemia. In the setting of ARB use and DM2, ? Mild RTA 4(but bicarb stable) hold ARB, resolved after Kayexalate and bowel regimen. Will monitor, hold ARB upon discharge.  3. ARF. Likely due to dehydration and ARB use, hold ARB, renal function much better and close to normal and was gently hydrated, renal ultrasound stable.  4.  Hypothyroidism. Continue Synthroid.  5. GERD. On PPI.  6. Hypertension. On Lopressor and Imdur continue, as needed IV Lopressor.  7. Mild hypocalcemia. Replaced. Monitor.   8. L elbow soft tissue injury and hematoma - incurred during the initial fall, X Rays stable, seen by Ortho, supportive Rx.  9.  DM type II. On sliding scale.  Lab Results  Component Value Date   HGBA1C 5.8 01/19/2016   CBG (last 3)   Recent Labs  03/02/16 2050 03/03/16 0028 03/03/16 0635  GLUCAP 200* 199* 109*    Family Communication  :  husband  Code Status : Full  Diet : Diet heart healthy/carb modified Room service appropriate? Yes; Fluid consistency: Thin   Disposition Plan  :  TBD  Consults  :  Ortho  Procedures  :  None  DVT Prophylaxis  :  SCD only per Ortho  Lab Results  Component Value Date   PLT 153 03/03/2016    Inpatient Medications  Scheduled Meds: . aspirin EC  81 mg Oral BID WC  . calcium gluconate  1 g Intravenous Once  . docusate sodium  100 mg Oral BID  . gabapentin  300 mg Oral QID  . insulin aspart  0-9 Units Subcutaneous Q6H  . iron polysaccharides  150 mg Oral Daily  . isosorbide mononitrate  60 mg Oral Daily  . levothyroxine  75 mcg Oral QAC breakfast  . metoprolol succinate  25 mg Oral Daily  . mirabegron ER  50 mg Oral Daily  . pantoprazole  40 mg Oral Daily  . pentosan polysulfate  100 mg Oral TID AC  . senna  1 tablet Oral BID  . sodium polystyrene  7.5 g Oral Once  . sorbitol, milk of mag, mineral oil, glycerin (SMOG) enema  960 mL Rectal Once   Continuous Infusions:  PRN Meds:.acetaminophen **OR** acetaminophen, HYDROcodone-acetaminophen, metoprolol, morphine injection, nitroGLYCERIN, [DISCONTINUED] ondansetron **OR** ondansetron (ZOFRAN) IV  Antibiotics  :    Anti-infectives    Start     Dose/Rate Route Frequency Ordered Stop   03/02/16 1345  ceFAZolin (ANCEF) IVPB 2g/100 mL premix     2 g 200 mL/hr over 30 Minutes Intravenous Every 6 hours  03/02/16 1339 03/03/16 0144   03/02/16 0933  ceFAZolin (ANCEF) 2-4 GM/100ML-% IVPB  Status:  Discontinued    Comments:  Marinda Elk   : cabinet override      03/02/16 0933 03/02/16 0939   03/01/16 0730  ceFAZolin (ANCEF) IVPB 2g/100 mL premix  Status:  Discontinued     2 g 200 mL/hr over 30 Minutes Intravenous To ShortStay Surgical 03/01/16 0707 03/02/16 1339         Objective:   Vitals:   03/02/16 1325 03/02/16 2047 03/03/16 0029 03/03/16 0514  BP: 127/73 (!) 129/48 102/68 (!) 142/55  Pulse: 87 84 72 73  Resp: 18 18 18 18   Temp: 97.5 F (36.4 C) 98.7 F (37.1 C) 97.6 F (36.4 C) 97.9 F (36.6 C)  TempSrc:  Oral Oral Oral  SpO2: 99% 99% 100% 100%  Weight:      Height:        Wt Readings from Last 3 Encounters:  02/29/16 51.3 kg (113 lb)  01/30/16 52.2 kg (115 lb 1.6 oz)  01/25/16 50.8 kg (112 lb)     Intake/Output Summary (Last 24 hours) at 03/03/16 0927 Last data filed at 03/02/16 1700  Gross per 24 hour  Intake             1620 ml  Output              500 ml  Net             1120 ml     Physical Exam  Awake Alert, Oriented X 3, No new F.N deficits, Normal  affect [place].AT,PERRAL Supple Neck,No JVD, No cervical lymphadenopathy appriciated.  Symmetrical Chest wall movement, Good air movement bilaterally, CTAB RRR,No Gallops,Rubs or new Murmurs, No Parasternal Heave +ve B.Sounds, Abd Soft, No tenderness, No organomegaly appriciated, No rebound - guarding or rigidity. No Cyanosis, Clubbing or edema, No new Rash or bruise Left hip postop scar site is stable, no hematoma, L elbow has hematoma, tender to palpate    Data Review:    CBC  Recent Labs Lab 02/29/16 2034 03/01/16 0639 03/02/16 0359 03/03/16 0335  WBC 10.1 10.9* 7.4 8.7  HGB 12.3 10.8* 10.6* 9.2*  HCT 40.4 35.8* 34.9* 29.0*  PLT 216 200 202 153  MCV 80.2 79.7 80.6 77.3*  MCH 24.4* 24.1* 24.5* 24.5*  MCHC 30.4 30.2 30.4 31.7  RDW 16.4* 16.2* 16.7* 16.4*  LYMPHSABS 1.9  --   --   --     MONOABS 0.4  --   --   --   EOSABS 0.3  --   --   --   BASOSABS 0.0  --   --   --     Chemistries   Recent Labs Lab 02/29/16 2034 03/01/16 0639 03/01/16 1042 03/02/16 0359 03/02/16 0733 03/03/16 0335  NA 137 136  --  139  --  136  K 5.8* 5.9* 4.9 4.3  --  4.8  CL 104 107  --  108  --  105  CO2 24 20*  --  25  --  24  GLUCOSE 165* 171*  --  116*  --  146*  BUN 34* 27*  --  17  --  19  CREATININE 1.27* 1.07*  --  1.09*  --  1.15*  CALCIUM 9.5 8.4*  --  7.5*  --  7.0*  MG  --   --   --  1.4*  --  2.2  AST  --   --   --   --  47*  --   ALT  --   --   --   --  28  --   ALKPHOS  --   --   --   --  56  --   BILITOT  --   --   --   --  0.8  --    ------------------------------------------------------------------------------------------------------------------ No results for input(s): CHOL, HDL, LDLCALC, TRIG, CHOLHDL, LDLDIRECT in the last 72 hours.  Lab Results  Component Value Date   HGBA1C 5.8 01/19/2016   ------------------------------------------------------------------------------------------------------------------ No results for input(s): TSH, T4TOTAL, T3FREE, THYROIDAB in the last 72 hours.  Invalid input(s): FREET3 ------------------------------------------------------------------------------------------------------------------ No results for input(s): VITAMINB12, FOLATE, FERRITIN, TIBC, IRON, RETICCTPCT in the last 72 hours.  Coagulation profile  Recent Labs Lab 02/29/16 2034  INR 0.94    No results for input(s): DDIMER in the last 72 hours.  Cardiac Enzymes  Recent Labs Lab 02/29/16 2303  TROPONINI <0.03   ------------------------------------------------------------------------------------------------------------------ No results found for: BNP  Micro Results  Recent Results (from the past 240 hour(s))  Surgical PCR screen     Status: None   Collection Time: 03/01/16  4:18 AM  Result Value Ref Range Status   MRSA, PCR NEGATIVE NEGATIVE Final    Staphylococcus aureus NEGATIVE NEGATIVE Final    Comment:        The Xpert SA Assay (FDA approved for NASAL specimens in patients over 78 years of age), is one component of a comprehensive surveillance program.  Test performance has been validated by [facility] for patients greater than or  equal to 40 year old. It is not intended to diagnose infection nor to guide or monitor treatment.     Radiology Reports  Dg Elbow Complete Left (3+view)  Result Date: 03/01/2016 CLINICAL DATA:  Left elbow pain with fall from bed, hip fx, concern for elbow fx as well EXAM: LEFT ELBOW - COMPLETE 3+ VIEW COMPARISON:  01/09/2009 FINDINGS: There is no evidence of fracture, dislocation, or joint effusion. Soft tissues are unremarkable. No joint effusion. Bones appear osteopenic. IMPRESSION: No evidence for acute  abnormality. Electronically Signed   By: Nolon Nations M.D.   On: 03/01/2016 09:32   US Renal  Result Date: 02/14/2016 CLINICAL DATA:  Hyponatremia, hypertension, diabetes EXAM: RENAL / URINARY TRACT ULTRASOUND COMPLETE COMPARISON:  None. FINDINGS: Right Kidney: Length: 8.3 centimeter. No hydronephrosis is seen. The echogenicity of the renal parenchyma is somewhat increased. Left Kidney: Length: 8.8 centimeter. No hydronephrosis is noted. The parenchyma of the kidney also is somewhat increased suggesting possible chronic renal medical disease. Correlation with renal laboratory values is recommended. Bladder: The urinary bladder is not well distended but no gross abnormality is evident. IMPRESSION: 1. No hydronephrosis. 2. Somewhat echogenic renal parenchyma suggests chronic renal medical disease. Correlate with renal laboratory values appear Electronically Signed   By: Ivar Drape M.D.   On: 02/14/2016 07:39   Pelvis Portable  Result Date: 03/02/2016 CLINICAL DATA:  Status post left hip arthroplasty EXAM: PORTABLE PELVIS 1-2 VIEWS COMPARISON:  02/29/2016 FINDINGS: Status post left total hip  arthroplasty. Components appear aligned in the frontal plane. Bones are osteopenic. Minor degenerative changes of the right hip. Visualized pelvis intact. Postop changes of left hip soft tissues. IMPRESSION: Recent left hip arthroplasty. No complicating feature by plain radiography. Electronically Signed   By: Jerilynn Mages.  Shick M.D.   On: 03/02/2016 12:53   Dg Chest Portable 1 View  Result Date: 02/29/2016 CLINICAL DATA:  Left hip fracture. EXAM: PORTABLE CHEST 1 VIEW COMPARISON:  01/02/2015 FINDINGS: A single AP portable view of the chest demonstrates no focal airspace consolidation or alveolar edema. The lungs are grossly clear. There is no large effusion or pneumothorax. Cardiac and mediastinal contours appear unremarkable. IMPRESSION: No active disease. Electronically Signed   By: Andreas Newport M.D.   On: 02/29/2016 22:53   Dg Shoulder Left  Result Date: 03/01/2016 CLINICAL DATA:  LEFT shoulder pain and swelling after fall today. History of arthritis. EXAM: LEFT SHOULDER - 2+ VIEW COMPARISON:  None. FINDINGS: There is no evidence of fracture or dislocation. There is no evidence of advanced arthropathy or other focal bone abnormality. Mild spurring of the humeral head. Osteopenia. Soft tissues are unremarkable. IMPRESSION: No acute fracture deformity or dislocation. Osteopenia, decreasing sensitivity for acute nondisplaced fractures. Electronically Signed   By: Elon Alas M.D.   On: 03/01/2016 01:36   Dg Knee Left Port  Result Date: 02/29/2016 CLINICAL DATA:  Left knee pain without trauma.  One year duration. EXAM: PORTABLE LEFT KNEE - 1-2 VIEW COMPARISON:  None. FINDINGS: Negative for acute fracture or dislocation. No bone lesion or bony destruction. No joint effusion. IMPRESSION: Negative. Electronically Signed   By: Andreas Newport M.D.   On: 02/29/2016 23:25   Dg C-arm 1-60 Min  Result Date: 03/02/2016 CLINICAL DATA:  LEFT hip arthroplasty EXAM: DG C-ARM 61-120 MIN; OPERATIVE LEFT HIP  WITH PELVIS COMPARISON:  02/29/2016 FLUOROSCOPY TIME:  0 minutes 55 seconds Images obtained: 3 digital C-arm fluoroscopic images intraoperatively FINDINGS: LEFT hip prosthesis newly identified. Bones demineralized. No fracture or  dislocation. IMPRESSION: LEFT hip prosthesis without acute complication. Electronically Signed   By: Lavonia Dana M.D.   On: 03/02/2016 17:13   Dg Hip Operative Unilat With Pelvis Left  Result Date: 03/02/2016 CLINICAL DATA:  LEFT hip arthroplasty EXAM: DG C-ARM 61-120 MIN; OPERATIVE LEFT HIP WITH PELVIS COMPARISON:  02/29/2016 FLUOROSCOPY TIME:  0 minutes 55 seconds Images obtained: 3 digital C-arm fluoroscopic images intraoperatively FINDINGS: LEFT hip prosthesis newly identified. Bones demineralized. No fracture or dislocation. IMPRESSION: LEFT hip prosthesis without acute complication. Electronically Signed   By: Lavonia Dana M.D.   On: 03/02/2016 17:13   Dg Hip Unilat With Pelvis 2-3 Views Left  Result Date: 02/29/2016 CLINICAL DATA:  Left hip pain after fall today. EXAM: DG HIP (WITH OR WITHOUT PELVIS) 2-3V LEFT COMPARISON:  None. FINDINGS: Displaced left femoral neck fracture with angulation. No significant shortening. Femoral head remains seated in the acetabulum. Pubic rami are intact. No additional acute fracture. IMPRESSION: Displaced left femoral neck fracture with angulation. Electronically Signed   By: Jeb Levering M.D.   On: 02/29/2016 21:53    Time Spent in minutes  30   Brelee Renk K M.D on 03/03/2016 at 9:27 AM  Between 7am to 7pm - Pager - [phone]  After 7pm go to [email or web address] - password [facility]  [facility] -  Office  [phone]

## 2016-03-03 NOTE — Evaluation (Signed)
Occupational Therapy Evaluation Patient Details Name: Abigail Hoover MRN: [id number] DOB: 07-Aug-1945 Today's Date: 03/03/2016    History of Present Illness 70 yo female admitted through ED on 02/29/16 following a fall resulting in left hip fx. Pt underwent a direct anterior THA 03/02/16. PMH significant for HTN, HLD, DM2, CKDIII.   Clinical Impression   Pt admitted with the above diagnoses and presents with below problem list. Pt will benefit from continued acute OT to address the below listed deficits and maximize independence with basic ADLs prior to d/c to next venue. PTA pt independent with ADLs. Pt currently min to mod A with LB/OOB ADLs. Discussed d/c plan with pt and daughter in law. They are still deciding if they want [facility] SNF prior to home. Will follow.      Follow Up Recommendations  SNF;Supervision/Assistance - 24 hour    Equipment Recommendations  Other (comment) (defer to next venue)    Recommendations for Other Services       Precautions / Restrictions Precautions Precautions: Fall Restrictions Weight Bearing Restrictions: Yes LLE Weight Bearing: Weight bearing as tolerated      Mobility Bed Mobility Overal bed mobility: Needs Assistance Bed Mobility: Sit to Supine       Sit to supine: Min assist;HOB elevated   General bed mobility comments: min A to steady LLE onto bed. +2 to scoot up in bed due to LUE/LLE pain.   Transfers Overall transfer level: Needs assistance Equipment used: Rolling walker (2 wheeled) Transfers: Sit to/from Stand Sit to Stand: Min assist              Balance Overall balance assessment: History of Falls                                          ADL Overall ADL's : Needs assistance/impaired Eating/Feeding: Set up;Sitting   Grooming: Set up;Sitting   Upper Body Bathing: Sitting;Min guard;Set up   Lower Body Bathing: Moderate assistance;Sit to/from stand   Upper Body Dressing : Set up;Min  guard;Sitting   Lower Body Dressing: Moderate assistance;Sit to/from stand   Toilet Transfer: Minimal assistance;Ambulation;RW (pivotal steps from recliner to [facility] at end of bed)   Toileting- Clothing Manipulation and Hygiene: Moderate assistance;Sit to/from stand       Functional mobility during ADLs: Minimal assistance;Rolling walker General ADL Comments: Pt completed pivotal steps from recliner to [facility], pericare and bedmobility as detailed above     Vision     Perception     Praxis      Pertinent Vitals/Pain Pain Assessment: 0-10 Pain Score: 7  Pain Location: left hip elbow Pain Descriptors / Indicators: Aching Pain Intervention(s): Monitored during session;Repositioned;Ice applied     Hand Dominance Right   Extremity/Trunk Assessment Upper Extremity Assessment Upper Extremity Assessment: LUE deficits/detail LUE Deficits / Details: residual pain in LUE from fall  LUE: Unable to fully assess due to pain   Lower Extremity Assessment Lower Extremity Assessment: Defer to PT evaluation   Cervical / Trunk Assessment Cervical / Trunk Assessment: Normal   Communication Communication Communication: Prefers language other than [place];No difficulties;Other (comment) (daughter-in-law translating at times)   Cognition Arousal/Alertness: Awake/alert Behavior During Therapy: WFL for tasks assessed/performed Overall Cognitive Status: Within Functional Limits for tasks assessed                     General Comments  Exercises       Shoulder Instructions      Home Living Family/patient expects to be discharged to:: Private residence Living Arrangements: Spouse/significant other Available Help at Discharge: Family;Friend(s);Available 24 hours/day Type of Home: Apartment Home Access: Level entry     Home Layout: One level     Bathroom Shower/Tub: Teacher, early years/pre: Standard     Home Equipment: Walker - 2 wheels          Prior  Functioning/Environment Level of Independence: Independent        Comments: independent before fall.         OT Problem List: Impaired balance (sitting and/or standing);Decreased knowledge of use of DME or AE;Decreased knowledge of precautions;Impaired UE functional use;Pain   OT Treatment/Interventions: Self-care/ADL training;DME and/or AE instruction;Therapeutic activities;Patient/family education;Balance training    OT Goals(Current goals can be found in the care plan section) Acute Rehab OT Goals Patient Stated Goal: to get home OT Goal Formulation: With patient/family Time For Goal Achievement: 03/10/16 Potential to Achieve Goals: Good ADL Goals Pt Will Perform Lower Body Bathing: with modified independence;sit to/from stand;with adaptive equipment Pt Will Perform Lower Body Dressing: with modified independence;with adaptive equipment;sit to/from stand Pt Will Transfer to Toilet: with supervision;ambulating Pt Will Perform Toileting - Clothing Manipulation and hygiene: with modified independence;sit to/from stand Pt Will Perform Tub/Shower Transfer: with supervision;ambulating;3 in 1;rolling walker Additional ADL Goal #1: Pt will be mod I with bed mobility to prepare for OOB ADLs.   OT Frequency: Min 2X/week   Barriers to D/C:            Co-evaluation              End of Session Equipment Utilized During Treatment: Gait belt;Rolling walker;Other (comment);Oxygen (O2 reapplied at end of session) Nurse Communication: Other (comment) (ok to be on RA for in-room transfer)  Activity Tolerance: Patient limited by pain Patient left: in bed;with call bell/phone within reach;with family/visitor present   Time: 2951-8841 OT Time Calculation (min): 29 min Charges:  OT General Charges $OT Visit: 1 Procedure OT Evaluation $OT Eval Low Complexity: 1 Procedure OT Treatments $Self Care/Home Management : 8-22 mins G-Codes:    Hortencia Pilar Apr 02, 2016, 4:56 PM

## 2016-03-03 NOTE — Progress Notes (Signed)
   Subjective: 1 Day Post-Op Procedure(s) (LRB): TOTAL HIP ARTHROPLASTY ANTERIOR APPROACH (Left) Patient reports no pain this morning. Patient seen in rounds for Dr. Lyla Glassing. Patient is well, but has had some minor complaints of pain in the thigh, requiring pain medications We will start therapy today.  Plan is to go Home after hospital stay.  Objective: Vital signs in last 24 hours: Temp:  [97.5 F (36.4 C)-98.7 F (37.1 C)] 97.9 F (36.6 C) (12/10 0514) Pulse Rate:  [72-103] 73 (12/10 0514) Resp:  [17-20] 18 (12/10 0514) BP: (102-170)/(48-91) 142/55 (12/10 0514) SpO2:  [96 %-100 %] 100 % (12/10 0514)  Intake/Output from previous day:  Intake/Output Summary (Last 24 hours) at 03/03/16 0823 Last data filed at 03/02/16 1700  Gross per 24 hour  Intake             1620 ml  Output              500 ml  Net             1120 ml    Intake/Output this shift: No intake/output data recorded.  Labs:  Recent Labs  02/29/16 2034 03/01/16 0639 03/02/16 0359 03/03/16 0335  HGB 12.3 10.8* 10.6* 9.2*    Recent Labs  03/02/16 0359 03/03/16 0335  WBC 7.4 8.7  RBC 4.33 3.75*  HCT 34.9* 29.0*  PLT 202 153    Recent Labs  03/02/16 0359 03/03/16 0335  NA 139 136  K 4.3 4.8  CL 108 105  CO2 25 24  BUN 17 19  CREATININE 1.09* 1.15*  GLUCOSE 116* 146*  CALCIUM 7.5* 7.0*    Recent Labs  02/29/16 2034  INR 0.94    EXAM General - Patient is Alert, Appropriate and Oriented Extremity - Neurovascular intact Sensation intact distally Dorsiflexion/Plantar flexion intact Dressing - dressing C/D/I Motor Function - intact, moving foot and toes well on exam.  Hemovac pulled without difficulty.  Past Medical History:  Diagnosis Date  . Anemia   . 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 Anxiety   . Arthritis   . Bladder irritation   . Deficiency anemia 05/27/2014  . Diabetes mellitus   . GERD (gastroesophageal reflux disease)   . Hypercholesterolemia   .  Hypertension   . Hypothyroidism   . Iron deficiency anemia, unspecified   . Psoriasis   . Thyroid disease     Assessment/Plan: 1 Day Post-Op Procedure(s) (LRB): TOTAL HIP ARTHROPLASTY ANTERIOR APPROACH (Left) Principal Problem:   Femoral neck fracture (HCC) Active Problems:   Essential hypertension   Hypothyroidism   Chronic kidney disease, stage III (moderate)   Hyperkalemia   Fall   Displaced fracture of left femoral neck (HCC)  Estimated body mass index is 21.35 kg/m as calculated from the following:   Height as of this encounter: 5\' 1"  (1.549 m).   Weight as of this encounter: 51.3 kg (113 lb). Advance diet Up with therapy Discharge home when ready  DVT Prophylaxis - Aspirin Weight Bearing As Tolerated left Leg Begin Therapy Hgb 9.2 - Add Iron  Arlee Muslim, PA-C Orthopaedic Surgery 03/03/2016, 8:23 AM

## 2016-03-03 NOTE — Evaluation (Addendum)
Physical Therapy Evaluation Patient Details Name: Abigail Hoover MRN: [id number] DOB: Aug 10, 1945 Today's Date: 03/03/2016   History of Present Illness  70 yo female admitted through ED on 02/29/16 following a fall resulting in left hip fx. Pt underwent a direct anterior THA 03/02/16. PMH significant for HTN, HLD, DM2, CKDIII.  Clinical Impression  Pt presents POD 1 and is moving well with therapy. Pt has been declining all pain medications prior to this visit which translates into increased difficulty performing weightbearing through LLE during gait. Prior to admission, pt was completely independent with all ADL's and IADL's and lives with her husband in a single level, first floor apartment. Pt presents with the below deficits and will benefit from being seen acutely to address these in order to assist with improved mobility before returning home. Pt had an arthroscopic surgery 10 days ago on RLE before falling and fracturing LLE. Pt and family are deciding on HH vs SNF and would like additional time to decide.     Follow Up Recommendations Home health PT;SNF;Other (comment) (Family deciding between [facility] vs SNF)    Equipment Recommendations  Rolling walker with 5" wheels;3in1 (PT)    Recommendations for Other Services       Precautions / Restrictions Precautions Precautions: Fall Restrictions Weight Bearing Restrictions: Yes LLE Weight Bearing: Weight bearing as tolerated      Mobility  Bed Mobility Overal bed mobility: Needs Assistance Bed Mobility: Supine to Sit     Supine to sit: Mod assist     General bed mobility comments: Mod A at trunk and min A to bring LLE EOB  Transfers Overall transfer level: Needs assistance Equipment used: Rolling walker (2 wheeled) Transfers: Sit to/from Stand Sit to Stand: Min assist         General transfer comment: Min A for EOB at trunk to stabilize  Ambulation/Gait Ambulation/Gait assistance: Min assist Ambulation Distance (Feet): 10  Feet Assistive device: Rolling walker (2 wheeled) Gait Pattern/deviations: Step-to pattern;Decreased step length - right;Decreased stance time - left;Antalgic Gait velocity: decreased Gait velocity interpretation: Below normal speed for age/gender General Gait Details: Moderate antalgic gait with increased difficulty weightbearing through LLE in stance, short distance gait from bed to chair  Stairs            Wheelchair Mobility    Modified Rankin (Stroke Patients Only)       Balance Overall balance assessment: History of Falls                                           Pertinent Vitals/Pain Pain Assessment: 0-10 Pain Score: 7  Pain Location: left hip elbow Pain Descriptors / Indicators: Aching Pain Intervention(s): Monitored during session;Premedicated before session;Heat applied    Home Living Family/patient expects to be discharged to:: Private residence Living Arrangements: Spouse/significant other Available Help at Discharge: Family;Friend(s);Available 24 hours/day Type of Home: Apartment Home Access: Level entry     Home Layout: One level Home Equipment: Walker - 2 wheels      Prior Function Level of Independence: Independent         Comments: independent before fall.      Hand Dominance   Dominant Hand: Right    Extremity/Trunk Assessment   Upper Extremity Assessment: Defer to OT evaluation           Lower Extremity Assessment: LLE deficits/detail RLE Deficits / Details: Pt with  deficits related to recent arthroscopic surgery 10 days ago. at least 3/5 grossly LLE Deficits / Details: Pt with normal post op pain and weakness. At least 3/5 ankle and 2/5 knee and hip per gross functional assessment  Cervical / Trunk Assessment: Normal  Communication   Communication: No difficulties  Cognition Arousal/Alertness: Awake/alert Behavior During Therapy: WFL for tasks assessed/performed Overall Cognitive Status: Within  Functional Limits for tasks assessed                      General Comments      Exercises Total Joint Exercises Ankle Circles/Pumps: AROM;Both;20 reps;Supine   Assessment/Plan    PT Assessment Patient needs continued PT services  PT Problem List Decreased strength;Decreased range of motion;Decreased activity tolerance;Decreased balance;Decreased mobility;Decreased knowledge of use of DME;Pain          PT Treatment Interventions DME instruction;Gait training;Stair training;Functional mobility training;Therapeutic activities;Therapeutic exercise;Balance training;Patient/family education    PT Goals (Current goals can be found in the Care Plan section)  Acute Rehab PT Goals Patient Stated Goal: to get home PT Goal Formulation: With patient/family Time For Goal Achievement: 03/10/16 Potential to Achieve Goals: Good    Frequency Min 5X/week   Barriers to discharge        Co-evaluation               End of Session Equipment Utilized During Treatment: Gait belt Activity Tolerance: Patient tolerated treatment well;Patient limited by pain Patient left: in chair;with call bell/phone within reach;with family/visitor present Nurse Communication: Mobility status         Time: 0175-1025 PT Time Calculation (min) (ACUTE ONLY): 40 min   Charges:   PT Evaluation $PT Eval Low Complexity: 1 Procedure PT Treatments $Gait Training: 8-22 mins $Therapeutic Activity: 8-22 mins   PT G Codes:        Scheryl Marten PT, DPT  [phone]  03/03/2016, 3:35 PM

## 2016-03-04 ENCOUNTER — Encounter (HOSPITAL_COMMUNITY): Payer: Self-pay | Admitting: Orthopedic Surgery

## 2016-03-04 DIAGNOSIS — E039 Hypothyroidism, unspecified: Secondary | ICD-10-CM

## 2016-03-04 DIAGNOSIS — I10 Essential (primary) hypertension: Secondary | ICD-10-CM

## 2016-03-04 DIAGNOSIS — S72002A Fracture of unspecified part of neck of left femur, initial encounter for closed fracture: Principal | ICD-10-CM

## 2016-03-04 DIAGNOSIS — N183 Chronic kidney disease, stage 3 (moderate): Secondary | ICD-10-CM

## 2016-03-04 LAB — BASIC METABOLIC PANEL
Anion gap: 9 (ref 5–15)
BUN: 18 mg/dL (ref 6–20)
CO2: 23 mmol/L (ref 22–32)
Calcium: 8.2 mg/dL — ABNORMAL LOW (ref 8.9–10.3)
Chloride: 108 mmol/L (ref 101–111)
Creatinine, Ser: 1.04 mg/dL — ABNORMAL HIGH (ref 0.44–1.00)
GFR calc Af Amer: >60 mL/min (ref >60)
GFR calc non Af Amer: 52 mL/min — ABNORMAL LOW (ref >60)
Glucose, Bld: 148 mg/dL — ABNORMAL HIGH (ref 65–99)
Potassium: 4.2 mmol/L (ref 3.5–5.1)
Sodium: 140 mmol/L (ref 135–145)

## 2016-03-04 LAB — CBC
HCT: 25.4 % — ABNORMAL LOW (ref 36.0–46.0)
Hemoglobin: 7.9 g/dL — ABNORMAL LOW (ref 12.0–15.0)
MCH: 24 pg — ABNORMAL LOW (ref 26.0–34.0)
MCHC: 31.1 g/dL (ref 30.0–36.0)
MCV: 77.2 fL — ABNORMAL LOW (ref 78.0–100.0)
Platelets: 159 10*3/uL (ref 150–400)
RBC: 3.29 MIL/uL — ABNORMAL LOW (ref 3.87–5.11)
RDW: 16.3 % — ABNORMAL HIGH (ref 11.5–15.5)
WBC: 6.5 10*3/uL (ref 4.0–10.5)

## 2016-03-04 LAB — GLUCOSE, CAPILLARY
Glucose-Capillary: 221 mg/dL — ABNORMAL HIGH (ref 65–99)
Glucose-Capillary: 133 mg/dL — ABNORMAL HIGH (ref 65–99)
Glucose-Capillary: 310 mg/dL — ABNORMAL HIGH (ref 65–99)
Glucose-Capillary: 109 mg/dL — ABNORMAL HIGH (ref 65–99)
Glucose-Capillary: 250 mg/dL — ABNORMAL HIGH (ref 65–99)

## 2016-03-04 MED ORDER — DARBEPOETIN ALFA 200 MCG/0.4ML IJ SOSY
200.0000 ug | PREFILLED_SYRINGE | Freq: Once | INTRAMUSCULAR | Status: AC
  Administered 2016-03-04: 200 ug via SUBCUTANEOUS
  Filled 2016-03-04: qty 0.4

## 2016-03-04 MED ORDER — INSULIN ASPART 100 UNIT/ML ~~LOC~~ SOLN
0.0000 [IU] | Freq: Three times a day (TID) | SUBCUTANEOUS | Status: DC
Start: 2016-03-04 — End: 2016-03-05
  Administered 2016-03-05: 2 [IU] via SUBCUTANEOUS
  Administered 2016-03-05: 3 [IU] via SUBCUTANEOUS

## 2016-03-04 MED ORDER — HYDROCODONE-ACETAMINOPHEN 5-325 MG PO TABS: 1.0000 | ORAL_TABLET | Freq: Four times a day (QID) | ORAL | 0 refills | Status: DC | PRN

## 2016-03-04 MED ORDER — ASPIRIN 81 MG PO TBEC: 81.0000 mg | DELAYED_RELEASE_TABLET | Freq: Two times a day (BID) | ORAL | 1 refills | Status: DC

## 2016-03-04 NOTE — Progress Notes (Signed)
PROGRESS NOTE        PATIENT DETAILS Name: Abigail Hoover Age: 70 y.o. Sex: female Date of Birth: 04-18-1945 Admit Date: 02/29/2016 Admitting Physician Norval Morton, MD AST:[id number] D, MD  Brief Narrative: Patient is a 70 y.o. female with medical history significant of HTN, HLD, DM type 2, and chronic anemia (on Aranesp injections) presented to the hospital following a mechanical fall, she was found to have left femoral neck fracture, and underwent ORIF of the left hip on 12/9. Family/patient agreeable for SNF placement.  Subjective: Pain at the operative site  No chest pain/shortness of breath  Assessment/Plan: Mechanical fall with right displaced left femoral neck fracture:Seen by orthopedics, post ORIF by Dr. Lyla Glassing on 03/02/2016. Postoperative course complicated by mild perioperative blood loss anemia.Recommendations from orthopedics are to continue with ASA 81 mg by mouth twice a day for 6 weeks. Weightbearing as tolerated to the left lower extremity. Patient/family agreeable for SNF placement. Have consulted social work.  Acute on chronic anemia: Has chronic anemia requiring Aranesp injections as an outpatient-acute anemia is likely secondary to perioperative blood loss. Repeat CBC-if decreases significantly, may require PRBC transfusion. Will order 1 dose of Aranesp while inpatient.  Hyperkalemia: In the setting of ARB use and DM2, ? Mild RTA 4(but bicarb stable) hold ARB, resolved after Kayexalate and bowel regimen. Will monitor, hold ARB upon discharge.  AKI: Likely due to dehydration and ARB use, hold ARB, renal function much better and close to normal and was gently hydrated, renal ultrasound stable.  Type 2 diabetes: Resume metformin and Januvia on discharge-CBGs on the higher side-we will change SSI to moderate scale.   Hypothyroidism: Continue Synthroid  GERD: Continue PPI  Hypertension: Controlled, continue with Lopressor and Imdur.  Plan on not resuming ARB on discharge.  Left lower soft tissue injury/hematoma: Due to mechanical fall, left elbow x-ray on 12/8 negative for fracture. Supportive care, continue outpatient follow-up with orthopedics.   DVT Prophylaxis: Per Ortho  Code Status: Full code   Family Communication: Spouse at bedside  Disposition Plan: Remain inpatient- SNF on discharge-likely 12/12  Antimicrobial agents: None  Procedures: Left ORIF 12/9  CONSULTS:  orthopedic surgery  Time spent: 25- minutes-Greater than 50% of this time was spent in counseling, explanation of diagnosis, planning of further management, and coordination of care.  MEDICATIONS: Anti-infectives    Start     Dose/Rate Route Frequency Ordered Stop   03/02/16 1345  ceFAZolin (ANCEF) IVPB 2g/100 mL premix     2 g 200 mL/hr over 30 Minutes Intravenous Every 6 hours 03/02/16 1339 03/03/16 0144   03/02/16 0933  ceFAZolin (ANCEF) 2-4 GM/100ML-% IVPB  Status:  Discontinued    Comments:  Marinda Elk   : cabinet override      03/02/16 0933 03/02/16 0939   03/01/16 0730  ceFAZolin (ANCEF) IVPB 2g/100 mL premix  Status:  Discontinued     2 g 200 mL/hr over 30 Minutes Intravenous To [facility] 03/01/16 0707 03/02/16 1339      Scheduled Meds: . aspirin EC  81 mg Oral BID WC  . calcium citrate  200 mg of elemental calcium Oral TID   And  . cholecalciferol  400 Units Oral TID  . darbepoetin (ARANESP) injection - NON-DIALYSIS  200 mcg Subcutaneous Once  . docusate sodium  100 mg Oral BID  . gabapentin  300  mg Oral QID  . insulin aspart  0-9 Units Subcutaneous Q6H  . iron polysaccharides  150 mg Oral Daily  . isosorbide mononitrate  60 mg Oral Daily  . levothyroxine  75 mcg Oral QAC breakfast  . metoprolol succinate  25 mg Oral Daily  . mirabegron ER  50 mg Oral Daily  . pantoprazole  40 mg Oral Daily  . pentosan polysulfate  100 mg Oral TID AC  . senna  1 tablet Oral BID  . sorbitol, milk of mag, mineral  oil, glycerin (SMOG) enema  960 mL Rectal Once   Continuous Infusions: PRN Meds:.acetaminophen **OR** [DISCONTINUED] acetaminophen, HYDROcodone-acetaminophen, metoprolol, morphine injection, nitroGLYCERIN, [DISCONTINUED] ondansetron **OR** ondansetron (ZOFRAN) IV   PHYSICAL EXAM: Vital signs: Vitals:   03/03/16 2112 03/03/16 2116 03/04/16 0636 03/04/16 0638  BP: (!) 103/40 (!) 105/39 (!) 145/55 (!) 154/55  Pulse: 79  77   Resp:      Temp: 98.6 F (37 C)  98.9 F (37.2 C)   TempSrc: Oral  Oral   SpO2:   100%   Weight:      Height:       Filed Weights   02/29/16 2006  Weight: 51.3 kg (113 lb)   Body mass index is 21.35 kg/m.   General appearance :Awake, alert, not in any distress. Speech Clear. Not toxic Looking Eyes:, pupils equally reactive to light and accomodation,no scleral icterus.Pink conjunctiva HEENT: Atraumatic and Normocephalic Neck: supple, no JVD. No cervical lymphadenopathy. No thyromegaly Resp:Good air entry bilaterally, no added sounds  CVS: S1 S2 regular, no murmurs.  GI: Bowel sounds present, Non tender and not distended with no gaurding, rigidity or rebound.No organomegaly Extremities: B/L Lower Ext shows no edema, both legs are warm to touch Neurology:  speech clear,Non focal, sensation is grossly intact. Psychiatric: Normal judgment and insight. Alert and oriented x 3. Normal mood. Musculoskeletal:No digital cyanosis Skin:No Rash, warm and dry Wounds:N/A  I have personally reviewed following labs and imaging studies  LABORATORY DATA: CBC:  Recent Labs Lab 02/29/16 2034 03/01/16 0639 03/02/16 0359 03/03/16 0335 03/04/16 0528  WBC 10.1 10.9* 7.4 8.7 6.5  NEUTROABS 7.6  --   --   --   --   HGB 12.3 10.8* 10.6* 9.2* 7.9*  HCT 40.4 35.8* 34.9* 29.0* 25.4*  MCV 80.2 79.7 80.6 77.3* 77.2*  PLT 216 200 202 153 144    Basic Metabolic Panel:  Recent Labs Lab 03/01/16 0639 03/01/16 1042 03/02/16 0359 03/03/16 0335 03/03/16 0750  03/04/16 0528  NA 136  --  139 136 138 140  K 5.9* 4.9 4.3 4.8 4.4 4.2  CL 107  --  108 105 107 108  CO2 20*  --  25 24 25 23   GLUCOSE 171*  --  116* 146* 101* 148*  BUN 27*  --  17 19 18 18   CREATININE 1.07*  --  1.09* 1.15* 1.21* 1.04*  CALCIUM 8.4*  --  7.5* 7.0* 7.2* 8.2*  MG  --   --  1.4* 2.2  --   --     GFR: Estimated Creatinine Clearance: 36.9 mL/min (by C-G formula based on SCr of 1.04 mg/dL (H)).  Liver Function Tests:  Recent Labs Lab 03/02/16 0733 03/03/16 0750  AST 47* 32  ALT 28 11*  ALKPHOS 56 51  BILITOT 0.8 0.7  PROT 5.2* 4.8*  ALBUMIN 3.0* 2.4*   No results for input(s): LIPASE, AMYLASE in the last 168 hours. No results for input(s): AMMONIA in the  last 168 hours.  Coagulation Profile:  Recent Labs Lab 02/29/16 2034  INR 0.94    Cardiac Enzymes:  Recent Labs Lab 02/29/16 2303  TROPONINI <0.03    BNP (last 3 results) No results for input(s): PROBNP in the last 8760 hours.  HbA1C: No results for input(s): HGBA1C in the last 72 hours.  CBG:  Recent Labs Lab 03/03/16 1629 03/03/16 2118 03/04/16 0147 03/04/16 0559 03/04/16 1216  GLUCAP 128* 255* 221* 133* 310*    Lipid Profile: No results for input(s): CHOL, HDL, LDLCALC, TRIG, CHOLHDL, LDLDIRECT in the last 72 hours.  Thyroid Function Tests: No results for input(s): TSH, T4TOTAL, FREET4, T3FREE, THYROIDAB in the last 72 hours.  Anemia Panel: No results for input(s): VITAMINB12, FOLATE, FERRITIN, TIBC, IRON, RETICCTPCT in the last 72 hours.  Urine analysis:    Component Value Date/Time   COLORURINE YELLOW 03/01/2016 0509   APPEARANCEUR CLEAR 03/01/2016 0509   LABSPEC 1.016 03/01/2016 0509   LABSPEC 1.005 08/01/2009 1438   PHURINE 5.0 03/01/2016 0509   GLUCOSEU 150 (A) 03/01/2016 0509   GLUCOSEU NEGATIVE 12/07/2014 1103   HGBUR NEGATIVE 03/01/2016 0509   BILIRUBINUR NEGATIVE 03/01/2016 0509   BILIRUBINUR negative 07/17/2015 1449   BILIRUBINUR Negative 08/01/2009 1438    KETONESUR NEGATIVE 03/01/2016 0509   PROTEINUR NEGATIVE 03/01/2016 0509   UROBILINOGEN 0.2 07/17/2015 1449   UROBILINOGEN 0.2 12/07/2014 1103   NITRITE NEGATIVE 03/01/2016 0509   LEUKOCYTESUR NEGATIVE 03/01/2016 0509   LEUKOCYTESUR Negative 08/01/2009 1438    Sepsis Labs: Lactic Acid, Venous No results found for: LATICACIDVEN  MICROBIOLOGY: Recent Results (from the past 240 hour(s))  Surgical PCR screen     Status: None   Collection Time: 03/01/16  4:18 AM  Result Value Ref Range Status   MRSA, PCR NEGATIVE NEGATIVE Final   Staphylococcus aureus NEGATIVE NEGATIVE Final    Comment:        The Xpert SA Assay (FDA approved for NASAL specimens in patients over 33 years of age), is one component of a comprehensive surveillance program.  Test performance has been validated by [facility] for patients greater than or equal to 82 year old. It is not intended to diagnose infection nor to guide or monitor treatment.     RADIOLOGY STUDIES/RESULTS: Dg Elbow Complete Left (3+view)  Result Date: 03/01/2016 CLINICAL DATA:  Left elbow pain with fall from bed, hip fx, concern for elbow fx as well EXAM: LEFT ELBOW - COMPLETE 3+ VIEW COMPARISON:  01/09/2009 FINDINGS: There is no evidence of fracture, dislocation, or joint effusion. Soft tissues are unremarkable. No joint effusion. Bones appear osteopenic. IMPRESSION: No evidence for acute  abnormality. Electronically Signed   By: Nolon Nations M.D.   On: 03/01/2016 09:32   US Renal  Result Date: 02/14/2016 CLINICAL DATA:  Hyponatremia, hypertension, diabetes EXAM: RENAL / URINARY TRACT ULTRASOUND COMPLETE COMPARISON:  None. FINDINGS: Right Kidney: Length: 8.3 centimeter. No hydronephrosis is seen. The echogenicity of the renal parenchyma is somewhat increased. Left Kidney: Length: 8.8 centimeter. No hydronephrosis is noted. The parenchyma of the kidney also is somewhat increased suggesting possible chronic renal medical disease.  Correlation with renal laboratory values is recommended. Bladder: The urinary bladder is not well distended but no gross abnormality is evident. IMPRESSION: 1. No hydronephrosis. 2. Somewhat echogenic renal parenchyma suggests chronic renal medical disease. Correlate with renal laboratory values appear Electronically Signed   By: Ivar Drape M.D.   On: 02/14/2016 07:39   Pelvis Portable  Result Date: 03/02/2016 CLINICAL  DATA:  Status post left hip arthroplasty EXAM: PORTABLE PELVIS 1-2 VIEWS COMPARISON:  02/29/2016 FINDINGS: Status post left total hip arthroplasty. Components appear aligned in the frontal plane. Bones are osteopenic. Minor degenerative changes of the right hip. Visualized pelvis intact. Postop changes of left hip soft tissues. IMPRESSION: Recent left hip arthroplasty. No complicating feature by plain radiography. Electronically Signed   By: Jerilynn Mages.  Shick M.D.   On: 03/02/2016 12:53   Dg Chest Portable 1 View  Result Date: 02/29/2016 CLINICAL DATA:  Left hip fracture. EXAM: PORTABLE CHEST 1 VIEW COMPARISON:  01/02/2015 FINDINGS: A single AP portable view of the chest demonstrates no focal airspace consolidation or alveolar edema. The lungs are grossly clear. There is no large effusion or pneumothorax. Cardiac and mediastinal contours appear unremarkable. IMPRESSION: No active disease. Electronically Signed   By: Andreas Newport M.D.   On: 02/29/2016 22:53   Dg Shoulder Left  Result Date: 03/01/2016 CLINICAL DATA:  LEFT shoulder pain and swelling after fall today. History of arthritis. EXAM: LEFT SHOULDER - 2+ VIEW COMPARISON:  None. FINDINGS: There is no evidence of fracture or dislocation. There is no evidence of advanced arthropathy or other focal bone abnormality. Mild spurring of the humeral head. Osteopenia. Soft tissues are unremarkable. IMPRESSION: No acute fracture deformity or dislocation. Osteopenia, decreasing sensitivity for acute nondisplaced fractures. Electronically Signed    By: Elon Alas M.D.   On: 03/01/2016 01:36   Dg Knee Left Port  Result Date: 02/29/2016 CLINICAL DATA:  Left knee pain without trauma.  One year duration. EXAM: PORTABLE LEFT KNEE - 1-2 VIEW COMPARISON:  None. FINDINGS: Negative for acute fracture or dislocation. No bone lesion or bony destruction. No joint effusion. IMPRESSION: Negative. Electronically Signed   By: Andreas Newport M.D.   On: 02/29/2016 23:25   Dg C-arm 1-60 Min  Result Date: 03/02/2016 CLINICAL DATA:  LEFT hip arthroplasty EXAM: DG C-ARM 61-120 MIN; OPERATIVE LEFT HIP WITH PELVIS COMPARISON:  02/29/2016 FLUOROSCOPY TIME:  0 minutes 55 seconds Images obtained: 3 digital C-arm fluoroscopic images intraoperatively FINDINGS: LEFT hip prosthesis newly identified. Bones demineralized. No fracture or dislocation. IMPRESSION: LEFT hip prosthesis without acute complication. Electronically Signed   By: Lavonia Dana M.D.   On: 03/02/2016 17:13   Dg Hip Operative Unilat With Pelvis Left  Result Date: 03/02/2016 CLINICAL DATA:  LEFT hip arthroplasty EXAM: DG C-ARM 61-120 MIN; OPERATIVE LEFT HIP WITH PELVIS COMPARISON:  02/29/2016 FLUOROSCOPY TIME:  0 minutes 55 seconds Images obtained: 3 digital C-arm fluoroscopic images intraoperatively FINDINGS: LEFT hip prosthesis newly identified. Bones demineralized. No fracture or dislocation. IMPRESSION: LEFT hip prosthesis without acute complication. Electronically Signed   By: Lavonia Dana M.D.   On: 03/02/2016 17:13   Dg Hip Unilat With Pelvis 2-3 Views Left  Result Date: 02/29/2016 CLINICAL DATA:  Left hip pain after fall today. EXAM: DG HIP (WITH OR WITHOUT PELVIS) 2-3V LEFT COMPARISON:  None. FINDINGS: Displaced left femoral neck fracture with angulation. No significant shortening. Femoral head remains seated in the acetabulum. Pubic rami are intact. No additional acute fracture. IMPRESSION: Displaced left femoral neck fracture with angulation. Electronically Signed   By: Jeb Levering  M.D.   On: 02/29/2016 21:53     LOS: 3 days   Oren Binet, MD  [facility] Pager:[phone]  If 7PM-7AM, please contact night-coverage [email or web address] Password [facility] 03/04/2016, 12:29 PM

## 2016-03-04 NOTE — Progress Notes (Signed)
Occupational Therapy Treatment Patient Details Name: Abigail Hoover MRN: [id number] DOB: 26-Feb-1946 Today's Date: 03/04/2016    History of present illness 70 yo female admitted through ED on 02/29/16 following a fall resulting in left hip fx. Pt underwent a direct anterior THA 03/02/16. PMH significant for HTN, HLD, DM2, CKDIII.   OT comments  Pt progressing toward OT goals. She continues to be limited by pain but willing to work with OT. Pt able to complete functional toilet transfer with mod assist this session and toileting hygeine with lateral leans after set-up. Pt continues to be a good candidate for short-term SNF placement to improve independence with ADL and functional mobility and ensure safe return home. OT will continue to follow acutely.   Follow Up Recommendations  SNF;Supervision/Assistance - 24 hour    Equipment Recommendations  Other (comment) (Defer to next venue of care)       Precautions / Restrictions Precautions Precautions: Fall Restrictions Weight Bearing Restrictions: Yes LLE Weight Bearing: Weight bearing as tolerated       Mobility Bed Mobility Overal bed mobility: Needs Assistance Bed Mobility: Supine to Sit     Supine to sit: Mod assist;HOB elevated     General bed mobility comments: Received in recliner  Transfers Overall transfer level: Needs assistance Equipment used: Rolling walker (2 wheeled) Transfers: Sit to/from Stand Sit to Stand: Min assist         General transfer comment: Min A for EOB at trunk to stabilize    Balance Overall balance assessment: History of Falls                                 ADL Overall ADL's : Needs assistance/impaired Eating/Feeding: Set up;Sitting                       Toilet Transfer: Moderate assistance;Ambulation;RW (Ambulated in room to [facility]) Toilet Transfer Details (indicate cue type and reason): Mod assist for ambulation, min assist for sit<>stand Toileting- Clothing  Manipulation and Hygiene: Set up;Sitting/lateral lean       Functional mobility during ADLs: Moderate assistance;Rolling walker General ADL Comments: Pt able to complete functional ambulation to [facility] in room approximately 5 feet away.      Vision                     Perception     Praxis      Cognition   Behavior During Therapy: [facility] for tasks assessed/performed Overall Cognitive Status: Within Functional Limits for tasks assessed                       Extremity/Trunk Assessment               Exercises Total Joint Exercises Heel Slides: AAROM;Left;10 reps Hip ABduction/ADduction: AAROM;Left;10 reps   Shoulder Instructions       General Comments      Pertinent Vitals/ Pain       Pain Assessment: Faces Faces Pain Scale: Hurts even more Pain Location: left hip and upper arm Pain Descriptors / Indicators: Burning Pain Intervention(s): Limited activity within patient's tolerance;Monitored during session;Repositioned;Ice applied;Heat applied (ice to hip, heat to upper arm)  Home Living  Prior Functioning/Environment              Frequency  Min 2X/week        Progress Toward Goals  OT Goals(current goals can now be found in the care plan section)  Progress towards OT goals: Progressing toward goals  Acute Rehab OT Goals Patient Stated Goal: go home  OT Goal Formulation: With patient/family Time For Goal Achievement: 03/10/16 Potential to Achieve Goals: Good ADL Goals Pt Will Perform Lower Body Bathing: with modified independence;sit to/from stand;with adaptive equipment Pt Will Perform Lower Body Dressing: with modified independence;with adaptive equipment;sit to/from stand Pt Will Transfer to Toilet: with supervision;ambulating Pt Will Perform Toileting - Clothing Manipulation and hygiene: with modified independence;sit to/from stand Pt Will Perform Tub/Shower Transfer:  with supervision;ambulating;3 in 1;rolling walker Additional ADL Goal #1: Pt will be mod I with bed mobility to prepare for OOB ADLs.   Plan Discharge plan remains appropriate    Co-evaluation                 End of Session Equipment Utilized During Treatment: Gait belt;Rolling walker;Oxygen (O2 reapplied at end of session)   Activity Tolerance Patient limited by pain   Patient Left in chair;with call bell/phone within reach   Nurse Communication Mobility status (D/C planning)        Time: 7185-5015 OT Time Calculation (min): 25 min  Charges: OT General Charges $OT Visit: 1 Procedure OT Treatments $Self Care/Home Management : 23-37 mins  Norman Herrlich, OTR/L [phone] 03/04/2016, 2:06 PM

## 2016-03-04 NOTE — Progress Notes (Signed)
   Subjective:  Patient reports pain as mild to moderate.  No c/o.   Objective:   VITALS:   Vitals:   03/03/16 2112 03/03/16 2116 03/04/16 0636 03/04/16 0638  BP: (!) 103/40 (!) 105/39 (!) 145/55 (!) 154/55  Pulse: 79  77   Resp:      Temp: 98.6 F (37 C)  98.9 F (37.2 C)   TempSrc: Oral  Oral   SpO2:   100%   Weight:      Height:        NAD ABD soft Sensation intact distally Intact pulses distally Dorsiflexion/Plantar flexion intact Incision: dressing C/D/I Compartment soft   Lab Results  Component Value Date   WBC 6.5 03/04/2016   HGB 7.9 (L) 03/04/2016   HCT 25.4 (L) 03/04/2016   MCV 77.2 (L) 03/04/2016   PLT 159 03/04/2016   BMET    Component Value Date/Time   NA 140 03/04/2016 0528   NA 139 12/12/2015 1436   K 4.2 03/04/2016 0528   K 4.6 12/12/2015 1436   CL 108 03/04/2016 0528   CL 103 07/27/2012 1259   CO2 23 03/04/2016 0528   CO2 26 12/12/2015 1436   GLUCOSE 148 (H) 03/04/2016 0528   GLUCOSE 120 12/12/2015 1436   GLUCOSE 118 (H) 07/27/2012 1259   BUN 18 03/04/2016 0528   BUN 15.7 12/12/2015 1436   CREATININE 1.04 (H) 03/04/2016 0528   CREATININE 1.2 (H) 12/12/2015 1436   CALCIUM 8.2 (L) 03/04/2016 0528   CALCIUM 9.8 12/12/2015 1436   GFRNONAA 52 (L) 03/04/2016 0528   GFRAA >60 03/04/2016 0528     Assessment/Plan: 2 Days Post-Op   Principal Problem:   Femoral neck fracture (HCC) Active Problems:   Essential hypertension   Hypothyroidism   Chronic kidney disease, stage III (moderate)   Hyperkalemia   Fall   Displaced fracture of left femoral neck (HCC)    WBAT LLE with walker DVT ppx: ASA 81 mg PO BID x6 weeks, SCDs, TEDs PT/OT PO pain control Dispo: d/c home with HHPT, hold Boniva for 6 months postop    Elie Goody 03/04/2016, 10:01 AM   Rod Can, MD Cell [phone]

## 2016-03-04 NOTE — Progress Notes (Signed)
Inpatient Diabetes Program Recommendations  AACE/ADA: New Consensus Statement on Inpatient Glycemic Control (2015)  Target Ranges:  Prepandial:   less than 140 mg/dL      Peak postprandial:   less than 180 mg/dL (1-2 hours)      Critically ill patients:  140 - 180 mg/dL   Lab Results  Component Value Date   GLUCAP 310 (H) 03/04/2016   HGBA1C 5.8 01/19/2016    Review of Glycemic Control:  Results for Abigail Hoover, Abigail Hoover (MRN [id number]) as of 03/04/2016 12:22  Ref. Range 03/03/2016 16:29 03/03/2016 21:18 03/04/2016 01:47 03/04/2016 05:59 03/04/2016 12:16  Glucose-Capillary Latest Ref Range: 65 - 99 mg/dL 128 (H) 255 (H) 221 (H) 133 (H) 310 (H)   Diabetes history: Type 2 diabetes Outpatient Diabetes medications: Janumet 50-500 mg bid Current orders for Inpatient glycemic control:  Novolog sensitive q 6 hours  Inpatient Diabetes Program Recommendations:   Please change Novolog correction to moderate tid with meals and HS.    Thanks, Adah Perl, RN, BC-ADM Inpatient Diabetes Coordinator Pager [phone] (8a-5p)

## 2016-03-04 NOTE — NC FL2 (Signed)
[place] MEDICAID FL2 LEVEL OF CARE SCREENING TOOL     IDENTIFICATION  Patient Name: Abigail Hoover Birthdate: 1945/09/11 Sex: female Admission Date (Current Location): 02/29/2016  [facility] and [place] Number:  Herbalist and Address:  [facility] [place], [place] [address] [place], [place] [address]      Provider Number: [id number]  Attending Physician Name and Address:  Jonetta Osgood, MD  Relative Name and Phone Number:       Current Level of Care: Hospital Recommended Level of Care: [place] Prior Approval Number:    Date Approved/Denied: 03/04/16 PASRR Number: [id number]  Discharge Plan: SNF    Current Diagnoses: Patient Active Problem List   Diagnosis Date Noted  . Displaced fracture of left femoral neck ([place]) 03/02/2016  . Hyperkalemia 03/01/2016  . Fall 03/01/2016  . Femoral neck fracture ([place]) 02/29/2016  . Anemia due to stage 3 (moderate) chronic renal failure treated with erythropoietin 12/18/2015  . Chronic kidney disease, stage III (moderate) 08/14/2015  . 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 Acute coronary syndrome ([place]) 01/02/2015  . Deficiency anemia 05/27/2014  . Carpal tunnel syndrome 12/09/2013  . Diabetic neuropathy ([place]) 12/09/2013  . Type II or unspecified type diabetes mellitus with neurological manifestations, not stated as uncontrolled(250.60) 04/21/2013  . Disturbance of skin sensation 04/21/2013  . Type II or unspecified type diabetes mellitus without mention of complication, uncontrolled 10/30/2012  . Hypothyroidism 10/30/2012  . Breast mass in female-left 03/10/2012  . Weight loss 11/28/2011  . Anemia, chronic disease 08/02/2010  . Family history of breast cancer 08/01/2010  . Essential hypertension 02/01/2009  . EDEMA LEGS 02/01/2009    Orientation RESPIRATION BLADDER Height & Weight     Self, Time, Situation, Place  Normal Continent Weight: 113 lb (51.3 kg) Height:  5\' 1"   (154.9 cm)  BEHAVIORAL SYMPTOMS/MOOD NEUROLOGICAL BOWEL NUTRITION STATUS      Continent  (Please see discharge summary)  AMBULATORY STATUS COMMUNICATION OF NEEDS Skin   Limited Assist Verbally Surgical wounds (Closed incision left hip)                       Personal Care Assistance Level of Assistance  Bathing, Feeding, Dressing Bathing Assistance: Limited assistance Feeding assistance: Independent Dressing Assistance: Limited assistance     Functional Limitations Info  Sight, Speech, Hearing Sight Info: Adequate Hearing Info: Adequate Speech Info: Adequate    SPECIAL CARE FACTORS FREQUENCY  PT (By licensed PT), OT (By licensed OT)     PT Frequency: min 5x week OT Frequency: min 5x week            Contractures Contractures Info: Not present    Additional Factors Info  Code Status, Allergies Code Status Info: Full Allergies Info: Atorvastatin, Ezetimibe           Current Medications (03/04/2016):  This is the current hospital active medication list Current Facility-Administered Medications  Medication Dose Route Frequency Provider Last Rate Last Dose  . acetaminophen (TYLENOL) tablet 650 mg  650 mg Oral Q6H PRN Rod Can, MD      . aspirin EC tablet 81 mg  81 mg Oral BID WC Rod Can, MD   81 mg at 03/04/16 0823  . calcium citrate (CALCITRATE - dosed in mg elemental calcium) tablet 200 mg of elemental calcium  200 mg of elemental calcium Oral TID Thurnell Lose, MD   200 mg of elemental calcium at 03/04/16 [phone]  And  . cholecalciferol (VITAMIN D) tablet 400 Units  400 Units Oral TID Thurnell Lose, MD   400 Units at 03/04/16 1044  . Darbepoetin Alfa (ARANESP) injection 200 mcg  200 mcg Subcutaneous Once Jonetta Osgood, MD      . docusate sodium (COLACE) capsule 100 mg  100 mg Oral BID Rod Can, MD   100 mg at 03/04/16 1042  . gabapentin (NEURONTIN) capsule 300 mg  300 mg Oral QID Norval Morton, MD   300 mg at 03/04/16 1042  .  HYDROcodone-acetaminophen (NORCO/VICODIN) 5-325 MG per tablet 1-2 tablet  1-2 tablet Oral Q6H PRN Rod Can, MD   2 tablet at 03/04/16 [phone]  . insulin aspart (novoLOG) injection 0-15 Units  0-15 Units Subcutaneous TID WC Shanker Kristeen Mans, MD      . iron polysaccharides (NIFEREX) capsule 150 mg  150 mg Oral Daily Alexzandrew L Perkins, PA-C   150 mg at 03/04/16 1043  . isosorbide mononitrate (IMDUR) 24 hr tablet 60 mg  60 mg Oral Daily Norval Morton, MD   60 mg at 03/04/16 1043  . levothyroxine (SYNTHROID, LEVOTHROID) tablet 75 mcg  75 mcg Oral QAC breakfast Norval Morton, MD   75 mcg at 03/04/16 0607  . metoprolol (LOPRESSOR) injection 5 mg  5 mg Intravenous Q4H PRN Thurnell Lose, MD      . metoprolol succinate (TOPROL-XL) 24 hr tablet 25 mg  25 mg Oral Daily Norval Morton, MD   25 mg at 03/04/16 1042  . mirabegron ER (MYRBETRIQ) tablet 50 mg  50 mg Oral Daily Norval Morton, MD   50 mg at 03/04/16 1043  . morphine 2 MG/ML injection 0.5 mg  0.5 mg Intravenous Q2H PRN Rod Can, MD      . nitroGLYCERIN (NITROSTAT) SL tablet 0.4 mg  0.4 mg Sublingual Q5 Min x 3 PRN Norval Morton, MD      . ondansetron (ZOFRAN) injection 4 mg  4 mg Intravenous Q6H PRN Rod Can, MD      . pantoprazole (PROTONIX) EC tablet 40 mg  40 mg Oral Daily Norval Morton, MD   40 mg at 03/04/16 1042  . pentosan polysulfate (ELMIRON) capsule 100 mg  100 mg Oral TID AC Norval Morton, MD   100 mg at 03/04/16 0823  . senna (SENOKOT) tablet 8.6 mg  1 tablet Oral BID Rod Can, MD   8.6 mg at 03/04/16 1043  . sorbitol, milk of mag, mineral oil, glycerin (SMOG) enema  960 mL Rectal Once Thurnell Lose, MD         Discharge Medications: Please see discharge summary for a list of discharge medications.  Relevant Imaging Results:  Relevant Lab Results:   Additional Information SSN: [id number]  Alla German, LCSW

## 2016-03-04 NOTE — Consult Note (Signed)
[facility] CM Primary Care Navigator  03/04/2016  Abigail Hoover 1945/03/29 [id number]   Met with patient, husband (Pramod) and daughter-in law Asa Lente) at the bedside to identify possible discharge needs. Patient reports having increased severe pain to left hip that resulted to a fall which led to this admission/surgery.  Patient endorses Dr. [name] with [facility] at [facility] as her primary care provider.   Patient shared using [facility] to obtain medications without any problem.  Patient states she manages her own medications at home straight out of the containers.   She lives with spouse and reports that husband is providing transportation to her doctors' appointments.   Patient's husband will be the primary caregiver when she gets home as stated.  Discharge plan is leaning to skilled nursing facility for a short term rehabilitation before going home per family.  Patient voiced understanding to call primary care provider's office once she returns home, for a post discharge follow-up appointment within a week or sooner if needs arise. Patient letter provided as a reminder.  She denies any further concerns, issues or care coordination needs at this time. She states that her diabetes is managed "very well" with oral medication and diet. Latest A1c of 5.8.  For additional questions please contact:  Edwena Felty A. Deaunte Dente, BSN, RN-BC [facility] PRIMARY CARE Navigator Cell: [phone]

## 2016-03-04 NOTE — Progress Notes (Signed)
Physical Therapy Treatment Patient Details Name: Abigail Hoover MRN: [id number] DOB: [birth year] Today's Date: 03/04/2016    History of Present Illness 70 yo female admitted through ED on 02/29/16 following a fall resulting in left hip fx. Pt underwent a direct anterior THA 03/02/16. PMH significant for HTN, HLD, DM2, CKDIII.    PT Comments    Patient is making gradual progress toward mobility goals. Min/mod A for all mobility currently. Pt will have decreased caregiver assistance at home and will benefit from ST-SNF for further skilled PT services to maximize independence and safety with mobility.   Follow Up Recommendations  SNF     Equipment Recommendations  Rolling walker with 5" wheels;3in1 (PT)    Recommendations for Other Services       Precautions / Restrictions Precautions Precautions: Fall Restrictions Weight Bearing Restrictions: Yes LLE Weight Bearing: Weight bearing as tolerated    Mobility  Bed Mobility Overal bed mobility: Needs Assistance Bed Mobility: Supine to Sit     Supine to sit: Mod assist;HOB elevated     General bed mobility comments: assist to bring L LE to EOB ,elevate trunk into sitting, and scoot hips to EOB  Transfers Overall transfer level: Needs assistance Equipment used: Rolling walker (2 wheeled) Transfers: Sit to/from Stand Sit to Stand: Min assist         General transfer comment: Min A for EOB at trunk to stabilize  Ambulation/Gait Ambulation/Gait assistance: Min assist Ambulation Distance (Feet): 25 Feet Assistive device: Rolling walker (2 wheeled) Gait Pattern/deviations: Step-to pattern;Decreased step length - right;Decreased stance time - left;Antalgic;Decreased weight shift to left Gait velocity: decreased   General Gait Details: cues for posture and proximity of RW   Stairs            Wheelchair Mobility    Modified Rankin (Stroke Patients Only)       Balance Overall balance assessment: History of  Falls                                  Cognition Arousal/Alertness: Awake/alert Behavior During Therapy: WFL for tasks assessed/performed Overall Cognitive Status: Within Functional Limits for tasks assessed                      Exercises Total Joint Exercises Heel Slides: AAROM;Left;10 reps Hip ABduction/ADduction: AAROM;Left;10 reps    General Comments        Pertinent Vitals/Pain Pain Assessment: Faces Faces Pain Scale: Hurts even more Pain Location: left hip and elbow Pain Descriptors / Indicators: Burning;Aching Pain Intervention(s): Limited activity within patient's tolerance;Monitored during session;Premedicated before session;Repositioned;Ice applied;Heat applied (ice to hip and heat to arm)    Home Living                      Prior Function            PT Goals (current goals can now be found in the care plan section) Acute Rehab PT Goals Patient Stated Goal: none stated PT Goal Formulation: With patient/family Time For Goal Achievement: 03/10/16 Potential to Achieve Goals: Good Progress towards PT goals: Progressing toward goals    Frequency    Min 5X/week      PT Plan Current plan remains appropriate    Co-evaluation             End of Session Equipment Utilized During Treatment: Gait belt Activity Tolerance: Patient tolerated treatment  well;Patient limited by pain Patient left: in chair;with call bell/phone within reach;with family/visitor present     Time: 1139-1206 PT Time Calculation (min) (ACUTE ONLY): 27 min  Charges:  $Gait Training: 8-22 mins $Therapeutic Activity: 8-22 mins                    G Codes:      [place] April, PTA Pager: [phone]   03/04/2016, 12:35 PM

## 2016-03-04 NOTE — Clinical Social Work Note (Signed)
Clinical Social Work Assessment  Patient Details  Name: Abigail Hoover MRN: [id number] Date of Birth: 03-25-1946  Date of referral:  03/04/16               Reason for consult:  Facility Placement                Permission sought to share information with:  Family Supports Permission granted to share information::     Name::        Agency::     Relationship::     Contact Information:     Housing/Transportation Living arrangements for the past 2 months:  [place] of Information:  Patient, Adult Children, Spouse Patient Interpreter Needed:  None Criminal Activity/Legal Involvement Pertinent to Current Situation/Hospitalization:  No - Comment as needed Significant Relationships:  Adult Children, Spouse Lives with:  Spouse Do you feel safe going back to the place where you live?  Yes Need for family participation in patient care:  Yes (Comment)  Care giving concerns:  Pt's daughter in law and husband at bedside. Pt's family has not concern at this time.   Social Worker assessment / plan:  CSW spoke with pt at bedside. Pt daughter in law and husband were present. Pt and pt's husband understand little spanish. Pt's daughter in law would explain to them what CSW was explaining. Pt lives at home with her husband. Pt and pt's husband live near the hospital. Pt's spouse requesting that CSW send referral to facilities near hospital in [place] area. CSW will provide bed offers once available.   Employment status:  Retired Forensic scientist:  Medicare PT Recommendations:  [place] / Referral to community resources:  [place]  Patient/Family's Response to care:  Pt's family verbalized understanding of CSW role and appreciation of support.  Patient/Family's Understanding of and Emotional Response to Diagnosis, Current Treatment, and Prognosis:  Pt's family understanding and realistic regarding pt's physical limitations. Pt  family is addressing all options, home with home health vs SNF. Per MD at this time pt needs SNF placement at d/c. Pt's family understanding and request a facility in [place]. Pt's family denies any concerns regarding treatment plan at this time.  Emotional Assessment Appearance:  Appears stated age Attitude/Demeanor/Rapport:   (Patient was appropriate.) Affect (typically observed):  Accepting, Appropriate, Calm Orientation:  Oriented to Self, Oriented to Place, Oriented to  Time, Oriented to Situation Alcohol / Substance use:  Not Applicable Psych involvement (Current and /or in the community):  No (Comment)  Discharge Needs  Concerns to be addressed:  No discharge needs identified Readmission within the last 30 days:  No Current discharge risk:  Dependent with Mobility Barriers to Discharge:  Continued Medical Work up   [employer], LCSW 03/04/2016, 12:27 PM

## 2016-03-05 ENCOUNTER — Encounter (HOSPITAL_COMMUNITY): Payer: Self-pay | Admitting: General Practice

## 2016-03-05 DIAGNOSIS — Z96642 Presence of left artificial hip joint: Secondary | ICD-10-CM | POA: Diagnosis not present

## 2016-03-05 DIAGNOSIS — Z9181 History of falling: Secondary | ICD-10-CM | POA: Diagnosis not present

## 2016-03-05 DIAGNOSIS — E785 Hyperlipidemia, unspecified: Secondary | ICD-10-CM | POA: Diagnosis not present

## 2016-03-05 DIAGNOSIS — N179 Acute kidney failure, unspecified: Secondary | ICD-10-CM | POA: Diagnosis not present

## 2016-03-05 DIAGNOSIS — E1149 Type 2 diabetes mellitus with other diabetic neurological complication: Secondary | ICD-10-CM | POA: Diagnosis not present

## 2016-03-05 DIAGNOSIS — M25552 Pain in left hip: Secondary | ICD-10-CM | POA: Diagnosis not present

## 2016-03-05 DIAGNOSIS — M6281 Muscle weakness (generalized): Secondary | ICD-10-CM | POA: Diagnosis not present

## 2016-03-05 DIAGNOSIS — D62 Acute posthemorrhagic anemia: Secondary | ICD-10-CM | POA: Diagnosis not present

## 2016-03-05 DIAGNOSIS — S72002A Fracture of unspecified part of neck of left femur, initial encounter for closed fracture: Secondary | ICD-10-CM | POA: Diagnosis not present

## 2016-03-05 DIAGNOSIS — Z9289 Personal history of other medical treatment: Secondary | ICD-10-CM

## 2016-03-05 DIAGNOSIS — D631 Anemia in chronic kidney disease: Secondary | ICD-10-CM | POA: Diagnosis not present

## 2016-03-05 DIAGNOSIS — N183 Chronic kidney disease, stage 3 (moderate): Secondary | ICD-10-CM | POA: Diagnosis not present

## 2016-03-05 DIAGNOSIS — E875 Hyperkalemia: Secondary | ICD-10-CM | POA: Diagnosis not present

## 2016-03-05 DIAGNOSIS — K219 Gastro-esophageal reflux disease without esophagitis: Secondary | ICD-10-CM | POA: Diagnosis not present

## 2016-03-05 DIAGNOSIS — M1990 Unspecified osteoarthritis, unspecified site: Secondary | ICD-10-CM | POA: Diagnosis not present

## 2016-03-05 DIAGNOSIS — E1122 Type 2 diabetes mellitus with diabetic chronic kidney disease: Secondary | ICD-10-CM | POA: Diagnosis not present

## 2016-03-05 DIAGNOSIS — S72002D Fracture of unspecified part of neck of left femur, subsequent encounter for closed fracture with routine healing: Secondary | ICD-10-CM | POA: Diagnosis not present

## 2016-03-05 DIAGNOSIS — E039 Hypothyroidism, unspecified: Secondary | ICD-10-CM | POA: Diagnosis not present

## 2016-03-05 DIAGNOSIS — I10 Essential (primary) hypertension: Secondary | ICD-10-CM | POA: Diagnosis not present

## 2016-03-05 HISTORY — DX: Personal history of other medical treatment: Z92.89

## 2016-03-05 LAB — CBC WITH DIFFERENTIAL/PLATELET
Basophils Absolute: 0 10*3/uL (ref 0.0–0.1)
Basophils Relative: 0 %
Eosinophils Absolute: 0.4 10*3/uL (ref 0.0–0.7)
Eosinophils Relative: 5 %
HCT: 34.5 % — ABNORMAL LOW (ref 36.0–46.0)
Hemoglobin: 11 g/dL — ABNORMAL LOW (ref 12.0–15.0)
Lymphocytes Relative: 28 %
Lymphs Abs: 2 10*3/uL (ref 0.7–4.0)
MCH: 25.2 pg — ABNORMAL LOW (ref 26.0–34.0)
MCHC: 31.9 g/dL (ref 30.0–36.0)
MCV: 79.1 fL (ref 78.0–100.0)
Monocytes Absolute: 0.5 10*3/uL (ref 0.1–1.0)
Monocytes Relative: 7 %
Neutro Abs: 4.3 10*3/uL (ref 1.7–7.7)
Neutrophils Relative %: 60 %
Platelets: 170 10*3/uL (ref 150–400)
RBC: 4.36 MIL/uL (ref 3.87–5.11)
RDW: 15.6 % — ABNORMAL HIGH (ref 11.5–15.5)
WBC: 7.1 10*3/uL (ref 4.0–10.5)

## 2016-03-05 LAB — GLUCOSE, CAPILLARY
Glucose-Capillary: 141 mg/dL — ABNORMAL HIGH (ref 65–99)
Glucose-Capillary: 156 mg/dL — ABNORMAL HIGH (ref 65–99)
Glucose-Capillary: 92 mg/dL (ref 65–99)

## 2016-03-05 LAB — CBC
HCT: 24.9 % — ABNORMAL LOW (ref 36.0–46.0)
Hemoglobin: 7.5 g/dL — ABNORMAL LOW (ref 12.0–15.0)
MCH: 23.9 pg — ABNORMAL LOW (ref 26.0–34.0)
MCHC: 30.1 g/dL (ref 30.0–36.0)
MCV: 79.3 fL (ref 78.0–100.0)
Platelets: 156 10*3/uL (ref 150–400)
RBC: 3.14 MIL/uL — ABNORMAL LOW (ref 3.87–5.11)
RDW: 16.8 % — ABNORMAL HIGH (ref 11.5–15.5)
WBC: 6.5 10*3/uL (ref 4.0–10.5)

## 2016-03-05 LAB — PREPARE RBC (CROSSMATCH)

## 2016-03-05 MED ORDER — POLYSACCHARIDE IRON COMPLEX 150 MG PO CAPS: 150.0000 mg | ORAL_CAPSULE | Freq: Every day | ORAL | Status: DC

## 2016-03-05 MED ORDER — DIPHENHYDRAMINE HCL 50 MG/ML IJ SOLN
25.0000 mg | Freq: Once | INTRAMUSCULAR | Status: AC
  Administered 2016-03-05: 25 mg via INTRAVENOUS
  Filled 2016-03-05: qty 1

## 2016-03-05 MED ORDER — ASPIRIN 81 MG PO TBEC: 81.0000 mg | DELAYED_RELEASE_TABLET | Freq: Two times a day (BID) | ORAL | Status: DC

## 2016-03-05 MED ORDER — SODIUM CHLORIDE 0.9 % IV SOLN
Freq: Once | INTRAVENOUS | Status: AC
  Administered 2016-03-05: 13:00:00 via INTRAVENOUS

## 2016-03-05 MED ORDER — FUROSEMIDE 10 MG/ML IJ SOLN
20.0000 mg | Freq: Once | INTRAMUSCULAR | Status: AC
  Administered 2016-03-05: 20 mg via INTRAVENOUS
  Filled 2016-03-05: qty 2

## 2016-03-05 MED ORDER — ACETAMINOPHEN 325 MG PO TABS
650.0000 mg | ORAL_TABLET | Freq: Once | ORAL | Status: AC
  Administered 2016-03-05: 650 mg via ORAL
  Filled 2016-03-05: qty 2

## 2016-03-05 NOTE — Clinical Social Work Note (Addendum)
Per RN pt discharge is anticipated for later this afternoon. CSW notified facility. Pts paperwork for PTAR is located on pt's chart. CSW will arrange PTAR once the pt is medically stable for d/c after receiving blood. RN is to call [phone] for report.  [address], [place]

## 2016-03-05 NOTE — Care Management Important Message (Signed)
Important Message  Patient Details  Name: Abigail Hoover MRN: [id number] Date of Birth: 08-23-1945   Medicare Important Message Given:  Yes    [name] [address] Circle 03/05/2016, 12:21 PM

## 2016-03-05 NOTE — Progress Notes (Signed)
Physical Therapy Treatment Patient Details Name: Abigail Hoover MRN: [id number] DOB: 20-Aug-1945 Today's Date: 03/05/2016    History of Present Illness 70 yo female admitted through ED on 02/29/16 following a fall resulting in left hip fx. Pt underwent a direct anterior THA 03/02/16. PMH significant for HTN, HLD, DM2, CKDIII.    PT Comments    Patient is progressing toward mobility and more tolerant of gait and therex this session. Continue to progress as tolerated with anticipated d/c to SNF for further skilled PT services.    Follow Up Recommendations  SNF     Equipment Recommendations  Rolling walker with 5" wheels;3in1 (PT)    Recommendations for Other Services       Precautions / Restrictions Precautions Precautions: Fall Restrictions Weight Bearing Restrictions: Yes LLE Weight Bearing: Weight bearing as tolerated    Mobility  Bed Mobility               General bed mobility comments: pt received standing in room with NT  Transfers Overall transfer level: Needs assistance Equipment used: Rolling walker (2 wheeled) Transfers: Sit to/from Stand Sit to Stand: Min guard         General transfer comment: min guard for safety; cues for sequencing and hand placement  Ambulation/Gait Ambulation/Gait assistance: Min assist Ambulation Distance (Feet): 85 Feet Assistive device: Rolling walker (2 wheeled) Gait Pattern/deviations: Step-to pattern;Decreased step length - right;Decreased stance time - left;Antalgic;Decreased weight shift to left Gait velocity: decreased   General Gait Details: cues for posture; pt with improved step length and WB on L LE this session   Stairs            Wheelchair Mobility    Modified Rankin (Stroke Patients Only)       Balance Overall balance assessment: History of Falls                                  Cognition Arousal/Alertness: Awake/alert Behavior During Therapy: WFL for tasks  assessed/performed Overall Cognitive Status: Within Functional Limits for tasks assessed                      Exercises Total Joint Exercises Quad Sets: AROM;Both;15 reps Heel Slides: Left;AROM;15 reps Hip ABduction/ADduction: Left;AROM;15 reps Long Arc Quad: AROM;Left;15 reps    General Comments General comments (skin integrity, edema, etc.): daughter present      Pertinent Vitals/Pain Pain Assessment: Faces Faces Pain Scale: Hurts little more Pain Location: left hip and elbow Pain Descriptors / Indicators: Burning;Aching Pain Intervention(s): Limited activity within patient's tolerance;Monitored during session;Premedicated before session;Repositioned    Home Living                      Prior Function            PT Goals (current goals can now be found in the care plan section) Acute Rehab PT Goals Patient Stated Goal: back to PLOF PT Goal Formulation: With patient/family Time For Goal Achievement: 03/10/16 Potential to Achieve Goals: Good Progress towards PT goals: Progressing toward goals    Frequency    Min 5X/week      PT Plan Current plan remains appropriate    Co-evaluation             End of Session Equipment Utilized During Treatment: Gait belt Activity Tolerance: Patient tolerated treatment well Patient left: in chair;with call bell/phone within reach;with family/visitor present  Time: 5868-2574 PT Time Calculation (min) (ACUTE ONLY): 26 min  Charges:  $Gait Training: 8-22 mins $Therapeutic Exercise: 8-22 mins                    G Codes:      [place] April, PTA Pager: [phone]   03/05/2016, 1:13 PM

## 2016-03-05 NOTE — Clinical Social Work Note (Signed)
RN anticipates pt will d/c later this evening pending lab results. When pt is medically cleared RN to call PTAR at [phone] before 5:00 PM or [phone] PM after 5:00. Be prepared to to provide, pt name, room number, facility [facility]), ssn, dob, and if pt will need O2 on transport. RN to call report at facility [phone].   Clinical Social Worker will sign off for now as social work intervention is no longer needed. Please consult [place] again if new need arises.  [address] [place]

## 2016-03-05 NOTE — Discharge Summary (Signed)
PATIENT DETAILS Name: Abigail Hoover Age: 70 y.o. Sex: female Date of Birth: 1946-02-16 MRN: [id number]. Admitting Physician: Norval Morton, MD DVV:[id number] D, MD  Admit Date: 02/29/2016 Discharge date: 03/05/2016  Recommendations for Outpatient Follow-up:  1. Follow up with PCP in 1-2 weeks 2. Ensure follow up with Orthopedics and Hematology Alvy Bimler) 3. Please obtain BMP/CBC in one week 4. Hold Boniva for 6 months postop 5. ASA 81 mg PO BID x6 weeks, along with SCDs/ TEDs for VTE prophylaxis.  Admitted From:  Home  Disposition: SNF   Home Health: No  Equipment/Devices: None  Discharge Condition: Stable  CODE STATUS: FULL CODE  Diet recommendation:  Heart Healthy   Brief Summary: See H&P, Labs, Consult and Test reports for all details in brief, Patient is a 70 y.o. female with medical history significant of HTN, HLD, DM type 2, and chronic anemia (on Aranesp injections) presented to the hospital following a mechanical fall, she was found to have left femoral neck fracture, and underwent ORIF of the left hip on 12/9.   Brief Hospital Course: Mechanical fall with right displaced left femoral neck fracture:Seen by orthopedics, post ORIF by Dr. Gilman Schmidt 03/02/2016.Postoperative course complicated by mild perioperative blood loss anemia.Recommendations from orthopedics are to continue with ASA 81 mg by mouth twice a day for 6 weeks. Weightbearing as tolerated to the left lower extremity. Patient/family agreeable for SNF placement.   Acute blood loss anemia on top of chronic anemia: Has chronic anemia requiring Aranesp injections as an outpatient-acute anemia is likely secondary to perioperative blood loss. Will transfuse one unit of PRBC on day of discharge, repeat CBC in 1 week. Will check post transfusion CBC prior to d/c. Given Aranesp on 12/11-please ensure follow up with Dr Alvy Bimler on discharge.   Hyperkalemia: In the setting of ARB use and DM2, ? Mild  RTA 4(but bicarb stable) hold ARB, resolved after Kayexalate and bowel regimen. Continue to  Monitor electrolytes as outpatient-will plan to  hold ARB upon discharge.  AKI: Likely due to dehydration and ARB use, hold ARB, renal function much better and close to normal and was gently hydrated, renal ultrasound stable.  Type 2 diabetes: Resume metformin and Januvia on discharge-CBGs relatively stable with SSI while inpatient.   Hypothyroidism: Continue Synthroid  GERD: Continue PPI  Hypertension: Controlled, continue with Lopressor and Imdur. Plan on not resuming ARB on discharge.  Left lower soft tissue injury/hematoma: Due to mechanical fall, left elbow x-ray on 12/8 negative for fracture. Supportive care, continue outpatient follow-up with orthopedics.   Procedures/Studies: Left ORIF 12/9  Discharge Diagnoses:  Principal Problem:   Femoral neck fracture ([place]) Active Problems:   Essential hypertension   Hypothyroidism   Chronic kidney disease, stage III (moderate)   Hyperkalemia   Fall   Displaced fracture of left femoral neck [facility])   Discharge Instructions:  Activity:  WBAT LLE with walker  Discharge Instructions    Call MD for:  redness, tenderness, or signs of infection (pain, swelling, redness, odor or green/yellow discharge around incision site)    Complete by:  As directed    Diet - low sodium heart healthy    Complete by:  As directed    Increase activity slowly    Complete by:  As directed    WBAT LLE with walker       Medication List    STOP taking these medications   aspirin 81 MG tablet Replaced by:  aspirin 81 MG EC tablet   ibandronate 150  MG tablet Commonly known as:  BONIVA   losartan 50 MG tablet Commonly known as:  COZAAR     TAKE these medications   aspirin 81 MG EC tablet Take 1 tablet (81 mg total) by mouth 2 (two) times daily with a meal. For 6 weeks Replaces:  aspirin 81 MG tablet   calcium citrate 950 MG tablet Commonly  known as:  CALCITRATE - dosed in mg elemental calcium Take 200 mg of elemental calcium by mouth daily. 1/2 tab   gabapentin 300 MG capsule Commonly known as:  NEURONTIN Take 300 mg by mouth 4 (four) times daily.   GLYSET 50 MG tablet Generic drug:  miglitol Take 1 tablet (50 mg total) by mouth 2 (two) times daily. Must Make Office Visit!!! What changed:  how much to take  additional instructions  Another medication with the same name was removed. Continue taking this medication, and follow the directions you see here.   HYDROcodone-acetaminophen 5-325 MG tablet Commonly known as:  NORCO/VICODIN Take 1-2 tablets by mouth every 6 (six) hours as needed for moderate pain.   iron polysaccharides 150 MG capsule Commonly known as:  NIFEREX Take 1 capsule (150 mg total) by mouth daily.   isosorbide mononitrate 60 MG 24 hr tablet Commonly known as:  IMDUR Take 1 tablet (60 mg total) by mouth daily.   levothyroxine 75 MCG tablet Commonly known as:  SYNTHROID Take 1 tablet (75 mcg total) by mouth daily.   metoprolol succinate 50 MG 24 hr tablet Commonly known as:  TOPROL-XL Take 1 tablet (50 mg total) by mouth daily. Take with or immediately following a meal. What changed:  how much to take  additional instructions   MYRBETRIQ 50 MG Tb24 tablet Generic drug:  mirabegron ER Take 50 mg by mouth daily.   nitroGLYCERIN 0.4 MG SL tablet Commonly known as:  NITROSTAT Place 1 tablet (0.4 mg total) under the tongue every 5 (five) minutes x 3 doses as needed for chest pain.   ONE TOUCH ULTRA 2 w/Device Kit Use to check blood sugars 1 time per day   pentosan polysulfate 100 MG capsule Commonly known as:  ELMIRON Take 100 mg by mouth 3 (three) times daily before meals.   sitaGLIPtin-metformin 50-500 MG tablet Commonly known as:  JANUMET Take 1 tablet by mouth 2 (two) times daily with a meal.       Contact information for follow-up providers    Swinteck, Horald Pollen, MD.  Schedule an appointment as soon as possible for a visit in 2 week(s).   Specialty:  Orthopedic Surgery Why:  For wound re-check Contact information: [address]. [address] [place] [address] [phone]        [name],[name], MD. Schedule an appointment as soon as possible for a visit in 1 week(s).   Specialty:  Internal Medicine Contact information: [address] [place] [address] [phone]            Contact information for after-discharge care    Destination    [facility] Follow up.   Specialty:  [place] information: [address] [address] [phone]                 Allergies  Allergen Reactions  . Atorvastatin Rash  . Ezetimibe Itching    Consultations:   orthopedic surgery   Other Procedures/Studies: Dg Elbow Complete Left (3+view)  Result Date: 03/01/2016 CLINICAL DATA:  Left elbow pain with fall from bed, hip fx, concern for elbow fx as well EXAM: LEFT ELBOW - COMPLETE 3+ VIEW COMPARISON:  01/09/2009 FINDINGS: There is no evidence of fracture, dislocation, or joint effusion. Soft tissues are unremarkable. No joint effusion. Bones appear osteopenic. IMPRESSION: No evidence for acute  abnormality. Electronically Signed   By: Nolon Nations M.D.   On: 03/01/2016 09:32   US Renal  Result Date: 02/14/2016 CLINICAL DATA:  Hyponatremia, hypertension, diabetes EXAM: RENAL / URINARY TRACT ULTRASOUND COMPLETE COMPARISON:  None. FINDINGS: Right Kidney: Length: 8.3 centimeter. No hydronephrosis is seen. The echogenicity of the renal parenchyma is somewhat increased. Left Kidney: Length: 8.8 centimeter. No hydronephrosis is noted. The parenchyma of the kidney also is somewhat increased suggesting possible chronic renal medical disease. Correlation with renal laboratory values is recommended. Bladder: The urinary bladder is not well distended but no  gross abnormality is evident. IMPRESSION: 1. No hydronephrosis. 2. Somewhat echogenic renal parenchyma suggests chronic renal medical disease. Correlate with renal laboratory values appear Electronically Signed   By: Ivar Drape M.D.   On: 02/14/2016 07:39   Pelvis Portable  Result Date: 03/02/2016 CLINICAL DATA:  Status post left hip arthroplasty EXAM: PORTABLE PELVIS 1-2 VIEWS COMPARISON:  02/29/2016 FINDINGS: Status post left total hip arthroplasty. Components appear aligned in the frontal plane. Bones are osteopenic. Minor degenerative changes of the right hip. Visualized pelvis intact. Postop changes of left hip soft tissues. IMPRESSION: Recent left hip arthroplasty. No complicating feature by plain radiography. Electronically Signed   By: Jerilynn Mages.  Shick M.D.   On: 03/02/2016 12:53   Dg Chest Portable 1 View  Result Date: 02/29/2016 CLINICAL DATA:  Left hip fracture. EXAM: PORTABLE CHEST 1 VIEW COMPARISON:  01/02/2015 FINDINGS: A single AP portable view of the chest demonstrates no focal airspace consolidation or alveolar edema. The lungs are grossly clear. There is no large effusion or pneumothorax. Cardiac and mediastinal contours appear unremarkable. IMPRESSION: No active disease. Electronically Signed   By: Andreas Newport M.D.   On: 02/29/2016 22:53   Dg Shoulder Left  Result Date: 03/01/2016 CLINICAL DATA:  LEFT shoulder pain and swelling after fall today. History of arthritis. EXAM: LEFT SHOULDER - 2+ VIEW COMPARISON:  None. FINDINGS: There is no evidence of fracture or dislocation. There is no evidence of advanced arthropathy or other focal bone abnormality. Mild spurring of the humeral head. Osteopenia. Soft tissues are unremarkable. IMPRESSION: No acute fracture deformity or dislocation. Osteopenia, decreasing sensitivity for acute nondisplaced fractures. Electronically Signed   By: Elon Alas M.D.   On: 03/01/2016 01:36   Dg Knee Left Port  Result Date: 02/29/2016 CLINICAL DATA:   Left knee pain without trauma.  One year duration. EXAM: PORTABLE LEFT KNEE - 1-2 VIEW COMPARISON:  None. FINDINGS: Negative for acute fracture or dislocation. No bone lesion or bony destruction. No joint effusion. IMPRESSION: Negative. Electronically Signed   By: Andreas Newport M.D.   On: 02/29/2016 23:25   Dg C-arm 1-60 Min  Result Date: 03/02/2016 CLINICAL DATA:  LEFT hip arthroplasty EXAM: DG C-ARM 61-120 MIN; OPERATIVE LEFT HIP WITH PELVIS COMPARISON:  02/29/2016 FLUOROSCOPY TIME:  0 minutes 55 seconds Images obtained: 3 digital C-arm fluoroscopic images intraoperatively FINDINGS: LEFT hip prosthesis newly identified. Bones demineralized. No fracture or dislocation. IMPRESSION: LEFT hip prosthesis without acute complication. Electronically Signed   By: Lavonia Dana M.D.   On: 03/02/2016 17:13   Dg Hip Operative Unilat With Pelvis Left  Result Date: 03/02/2016 CLINICAL DATA:  LEFT hip arthroplasty EXAM: DG C-ARM 61-120 MIN; OPERATIVE LEFT HIP WITH PELVIS COMPARISON:  02/29/2016 FLUOROSCOPY TIME:  0 minutes 55 seconds Images obtained: 3 digital C-arm fluoroscopic images intraoperatively FINDINGS: LEFT hip prosthesis newly identified. Bones demineralized. No fracture or dislocation. IMPRESSION: LEFT hip prosthesis without acute complication. Electronically Signed   By: Lavonia Dana M.D.   On: 03/02/2016 17:13   Dg Hip Unilat With Pelvis 2-3 Views Left  Result Date: 02/29/2016 CLINICAL DATA:  Left hip pain after fall today. EXAM: DG HIP (WITH OR WITHOUT PELVIS) 2-3V LEFT COMPARISON:  None. FINDINGS: Displaced left femoral neck fracture with angulation. No significant shortening. Femoral head remains seated in the acetabulum. Pubic rami are intact. No additional acute fracture. IMPRESSION: Displaced left femoral neck fracture with angulation. Electronically Signed   By: Jeb Levering M.D.   On: 02/29/2016 21:53      TODAY-DAY OF DISCHARGE:  Subjective:   Abigail Hoover today has no  headache,no chest abdominal pain,no new weakness tingling or numbness, feels much better wants to go home today.   Objective:   Blood pressure (!) 154/58, pulse 76, temperature 98.3 F (36.8 C), temperature source Oral, resp. rate 16, height _0  (1.549 m), weight 51.3 kg (113 lb), SpO2 100 %.  Intake/Output Summary (Last 24 hours) at 03/05/16 1018 Last data filed at 03/04/16 1700  Gross per 24 hour  Intake              240 ml  Output                0 ml  Net              240 ml   Filed Weights   02/29/16 2006  Weight: 51.3 kg (113 lb)    Exam: Awake Alert, Oriented *3, No new F.N deficits, Normal affect [place].AT,PERRAL Supple Neck,No JVD, No cervical lymphadenopathy appriciated.  Symmetrical Chest wall movement, Good air movement bilaterally, CTAB RRR,No Gallops,Rubs or new Murmurs, No Parasternal Heave +ve B.Sounds, Abd Soft, Non tender, No organomegaly appriciated, No rebound -guarding or rigidity. No Cyanosis, Clubbing or edema, No new Rash or bruise   PERTINENT RADIOLOGIC STUDIES: Dg Elbow Complete Left (3+view)  Result Date: 03/01/2016 CLINICAL DATA:  Left elbow pain with fall from bed, hip fx, concern for elbow fx as well EXAM: LEFT ELBOW - COMPLETE 3+ VIEW COMPARISON:  01/09/2009 FINDINGS: There is no evidence of fracture, dislocation, or joint effusion. Soft tissues are unremarkable. No joint effusion. Bones appear osteopenic. IMPRESSION: No evidence for acute  abnormality. Electronically Signed   By: Nolon Nations M.D.   On: 03/01/2016 09:32   US Renal  Result Date: 02/14/2016 CLINICAL DATA:  Hyponatremia, hypertension, diabetes EXAM: RENAL / URINARY TRACT ULTRASOUND COMPLETE COMPARISON:  None. FINDINGS: Right Kidney: Length: 8.3 centimeter. No hydronephrosis is seen. The echogenicity of the renal parenchyma is somewhat increased. Left Kidney: Length: 8.8 centimeter. No hydronephrosis is noted. The parenchyma of the kidney also is somewhat increased suggesting possible  chronic renal medical disease. Correlation with renal laboratory values is recommended. Bladder: The urinary bladder is not well distended but no gross abnormality is evident. IMPRESSION: 1. No hydronephrosis. 2. Somewhat echogenic renal parenchyma suggests chronic renal medical disease. Correlate with renal laboratory values appear Electronically Signed   By: Ivar Drape M.D.   On: 02/14/2016 07:39   Pelvis Portable  Result Date: 03/02/2016 CLINICAL DATA:  Status post left hip arthroplasty EXAM: PORTABLE PELVIS 1-2 VIEWS COMPARISON:  02/29/2016 FINDINGS:  Status post left total hip arthroplasty. Components appear aligned in the frontal plane. Bones are osteopenic. Minor degenerative changes of the right hip. Visualized pelvis intact. Postop changes of left hip soft tissues. IMPRESSION: Recent left hip arthroplasty. No complicating feature by plain radiography. Electronically Signed   By: Jerilynn Mages.  Shick M.D.   On: 03/02/2016 12:53   Dg Chest Portable 1 View  Result Date: 02/29/2016 CLINICAL DATA:  Left hip fracture. EXAM: PORTABLE CHEST 1 VIEW COMPARISON:  01/02/2015 FINDINGS: A single AP portable view of the chest demonstrates no focal airspace consolidation or alveolar edema. The lungs are grossly clear. There is no large effusion or pneumothorax. Cardiac and mediastinal contours appear unremarkable. IMPRESSION: No active disease. Electronically Signed   By: Andreas Newport M.D.   On: 02/29/2016 22:53   Dg Shoulder Left  Result Date: 03/01/2016 CLINICAL DATA:  LEFT shoulder pain and swelling after fall today. History of arthritis. EXAM: LEFT SHOULDER - 2+ VIEW COMPARISON:  None. FINDINGS: There is no evidence of fracture or dislocation. There is no evidence of advanced arthropathy or other focal bone abnormality. Mild spurring of the humeral head. Osteopenia. Soft tissues are unremarkable. IMPRESSION: No acute fracture deformity or dislocation. Osteopenia, decreasing sensitivity for acute nondisplaced  fractures. Electronically Signed   By: Elon Alas M.D.   On: 03/01/2016 01:36   Dg Knee Left Port  Result Date: 02/29/2016 CLINICAL DATA:  Left knee pain without trauma.  One year duration. EXAM: PORTABLE LEFT KNEE - 1-2 VIEW COMPARISON:  None. FINDINGS: Negative for acute fracture or dislocation. No bone lesion or bony destruction. No joint effusion. IMPRESSION: Negative. Electronically Signed   By: Andreas Newport M.D.   On: 02/29/2016 23:25   Dg C-arm 1-60 Min  Result Date: 03/02/2016 CLINICAL DATA:  LEFT hip arthroplasty EXAM: DG C-ARM 61-120 MIN; OPERATIVE LEFT HIP WITH PELVIS COMPARISON:  02/29/2016 FLUOROSCOPY TIME:  0 minutes 55 seconds Images obtained: 3 digital C-arm fluoroscopic images intraoperatively FINDINGS: LEFT hip prosthesis newly identified. Bones demineralized. No fracture or dislocation. IMPRESSION: LEFT hip prosthesis without acute complication. Electronically Signed   By: Lavonia Dana M.D.   On: 03/02/2016 17:13   Dg Hip Operative Unilat With Pelvis Left  Result Date: 03/02/2016 CLINICAL DATA:  LEFT hip arthroplasty EXAM: DG C-ARM 61-120 MIN; OPERATIVE LEFT HIP WITH PELVIS COMPARISON:  02/29/2016 FLUOROSCOPY TIME:  0 minutes 55 seconds Images obtained: 3 digital C-arm fluoroscopic images intraoperatively FINDINGS: LEFT hip prosthesis newly identified. Bones demineralized. No fracture or dislocation. IMPRESSION: LEFT hip prosthesis without acute complication. Electronically Signed   By: Lavonia Dana M.D.   On: 03/02/2016 17:13   Dg Hip Unilat With Pelvis 2-3 Views Left  Result Date: 02/29/2016 CLINICAL DATA:  Left hip pain after fall today. EXAM: DG HIP (WITH OR WITHOUT PELVIS) 2-3V LEFT COMPARISON:  None. FINDINGS: Displaced left femoral neck fracture with angulation. No significant shortening. Femoral head remains seated in the acetabulum. Pubic rami are intact. No additional acute fracture. IMPRESSION: Displaced left femoral neck fracture with angulation.  Electronically Signed   By: Jeb Levering M.D.   On: 02/29/2016 21:53     PERTINENT LAB RESULTS: CBC:  Recent Labs  03/04/16 0528 03/05/16 0432  WBC 6.5 6.5  HGB 7.9* 7.5*  HCT 25.4* 24.9*  PLT 159 156   CMET CMP     Component Value Date/Time   NA 140 03/04/2016 0528   NA 139 12/12/2015 1436   K 4.2 03/04/2016 0528   K 4.6 12/12/2015  1436   CL 108 03/04/2016 0528   CL 103 07/27/2012 1259   CO2 23 03/04/2016 0528   CO2 26 12/12/2015 1436   GLUCOSE 148 (H) 03/04/2016 0528   GLUCOSE 120 12/12/2015 1436   GLUCOSE 118 (H) 07/27/2012 1259   BUN 18 03/04/2016 0528   BUN 15.7 12/12/2015 1436   CREATININE 1.04 (H) 03/04/2016 0528   CREATININE 1.2 (H) 12/12/2015 1436   CALCIUM 8.2 (L) 03/04/2016 0528   CALCIUM 9.8 12/12/2015 1436   PROT 4.8 (L) 03/03/2016 0750   PROT 6.2 (L) 12/12/2015 1436   ALBUMIN 2.4 (L) 03/03/2016 0750   ALBUMIN 3.4 (L) 12/12/2015 1436   AST 32 03/03/2016 0750   AST 18 12/12/2015 1436   ALT 11 (L) 03/03/2016 0750   ALT 15 12/12/2015 1436   ALKPHOS 51 03/03/2016 0750   ALKPHOS 74 12/12/2015 1436   BILITOT 0.7 03/03/2016 0750   BILITOT <0.30 12/12/2015 1436   GFRNONAA 52 (L) 03/04/2016 0528   GFRAA >60 03/04/2016 0528    GFR Estimated Creatinine Clearance: 36.9 mL/min (by C-G formula based on SCr of 1.04 mg/dL (H)). No results for input(s): LIPASE, AMYLASE in the last 72 hours. No results for input(s): CKTOTAL, CKMB, CKMBINDEX, TROPONINI in the last 72 hours. Invalid input(s): POCBNP No results for input(s): DDIMER in the last 72 hours. No results for input(s): HGBA1C in the last 72 hours. No results for input(s): CHOL, HDL, LDLCALC, TRIG, CHOLHDL, LDLDIRECT in the last 72 hours. No results for input(s): TSH, T4TOTAL, T3FREE, THYROIDAB in the last 72 hours.  Invalid input(s): FREET3 No results for input(s): VITAMINB12, FOLATE, FERRITIN, TIBC, IRON, RETICCTPCT in the last 72 hours. Coags: No results for input(s): INR in the last 72  hours.  Invalid input(s): PT Microbiology: Recent Results (from the past 240 hour(s))  Surgical PCR screen     Status: None   Collection Time: 03/01/16  4:18 AM  Result Value Ref Range Status   MRSA, PCR NEGATIVE NEGATIVE Final   Staphylococcus aureus NEGATIVE NEGATIVE Final    Comment:        The Xpert SA Assay (FDA approved for NASAL specimens in patients over 66 years of age), is one component of a comprehensive surveillance program.  Test performance has been validated by [facility] for patients greater than or equal to 30 year old. It is not intended to diagnose infection nor to guide or monitor treatment.     FURTHER DISCHARGE INSTRUCTIONS:  Get Medicines reviewed and adjusted: Please take all your medications with you for your next visit with your Primary MD  Laboratory/radiological data: Please request your Primary MD to go over all hospital tests and procedure/radiological results at the follow up, please ask your Primary MD to get all Hospital records sent to his/her office.  In some cases, they will be blood work, cultures and biopsy results pending at the time of your discharge. Please request that your primary care M.D. goes through all the records of your hospital data and follows up on these results.  Also Note the following: If you experience worsening of your admission symptoms, develop shortness of breath, life threatening emergency, suicidal or homicidal thoughts you must seek medical attention immediately by calling 911 or calling your MD immediately  if symptoms less severe.  You must read complete instructions/literature along with all the possible adverse reactions/side effects for all the Medicines you take and that have been prescribed to you. Take any new Medicines after you have completely understood  and accpet all the possible adverse reactions/side effects.   Do not drive when taking Pain medications or sleeping medications (Benzodaizepines)  Do  not take more than prescribed Pain, Sleep and Anxiety Medications. It is not advisable to combine anxiety,sleep and pain medications without talking with your primary care practitioner  Special Instructions: If you have smoked or chewed Tobacco  in the last 2 yrs please stop smoking, stop any regular Alcohol  and or any Recreational drug use.  Wear Seat belts while driving.  Please note: You were cared for by a hospitalist during your hospital stay. Once you are discharged, your primary care physician will handle any further medical issues. Please note that NO REFILLS for any discharge medications will be authorized once you are discharged, as it is imperative that you return to your primary care physician (or establish a relationship with a primary care physician if you do not have one) for your post hospital discharge needs so that they can reassess your need for medications and monitor your lab values.  Total Time spent coordinating discharge including counseling, education and face to face time equals 45 minutes.  SignedOren Binet 03/05/2016 10:18 AM

## 2016-03-05 NOTE — Clinical Social Work Placement (Signed)
   CLINICAL SOCIAL WORK PLACEMENT  NOTE  Date:  03/05/2016  Patient Details  Name: Abigail Hoover MRN: [id number] Date of Birth: [birth year]  Clinical Social Work is seeking post-discharge placement for this patient at the [place] level of care (*CSW will initial, date and re-position this form in  chart as items are completed):      Patient/family provided with [facility] Work Department's list of facilities offering this level of care within the geographic area requested by the patient (or if unable, by the patient's family).  Yes   Patient/family informed of their freedom to choose among providers that offer the needed level of care, that participate in Medicare, Medicaid or managed care program needed by the patient, have an available bed and are willing to accept the patient.      Patient/family informed of [facility]'s ownership interest in [facility] and [facility], as well as of the fact that they are under no obligation to receive care at these facilities.  PASRR submitted to EDS on       PASRR number received on       Existing PASRR number confirmed on 03/04/16     FL2 transmitted to all facilities in geographic area requested by pt/family on 03/04/16     FL2 transmitted to all facilities within larger geographic area on       Patient informed that his/her managed care company has contracts with or will negotiate with certain facilities, including the following:        Yes   Patient/family informed of bed offers received.  Patient chooses bed at [facility] recommends and patient chooses bed at      Patient to be transferred to [facility] on 03/05/16.  Patient to be transferred to facility by [facility]     Patient family notified on 03/05/16 of transfer.  Name of family member notified:  Pramod     PHYSICIAN Please prepare priority discharge summary, including medications, Please  prepare prescriptions     Additional Comment:    _______________________________________________ Alla German, LCSW 03/05/2016, 9:58 AM

## 2016-03-05 NOTE — Progress Notes (Signed)
   Subjective:  Patient reports pain as mild to moderate.  No c/o. Mod assist with PT.  Objective:   VITALS:   Vitals:   03/04/16 0638 03/04/16 1300 03/04/16 1958 03/05/16 0530  BP: (!) 154/55 (!) 126/58 (!) 131/40 (!) 154/58  Pulse:  70 82 76  Resp:  16 16 16   Temp:  98.1 F (36.7 C) 98.2 F (36.8 C) 98.3 F (36.8 C)  TempSrc:  Oral Oral Oral  SpO2:  98% 100% 100%  Weight:      Height:        NAD ABD soft Sensation intact distally Intact pulses distally Dorsiflexion/Plantar flexion intact Incision: dressing C/D/I Compartment soft   Lab Results  Component Value Date   WBC 6.5 03/05/2016   HGB 7.5 (L) 03/05/2016   HCT 24.9 (L) 03/05/2016   MCV 79.3 03/05/2016   PLT 156 03/05/2016   BMET    Component Value Date/Time   NA 140 03/04/2016 0528   NA 139 12/12/2015 1436   K 4.2 03/04/2016 0528   K 4.6 12/12/2015 1436   CL 108 03/04/2016 0528   CL 103 07/27/2012 1259   CO2 23 03/04/2016 0528   CO2 26 12/12/2015 1436   GLUCOSE 148 (H) 03/04/2016 0528   GLUCOSE 120 12/12/2015 1436   GLUCOSE 118 (H) 07/27/2012 1259   BUN 18 03/04/2016 0528   BUN 15.7 12/12/2015 1436   CREATININE 1.04 (H) 03/04/2016 0528   CREATININE 1.2 (H) 12/12/2015 1436   CALCIUM 8.2 (L) 03/04/2016 0528   CALCIUM 9.8 12/12/2015 1436   GFRNONAA 52 (L) 03/04/2016 0528   GFRAA >60 03/04/2016 0528     Assessment/Plan: 3 Days Post-Op   Principal Problem:   Femoral neck fracture (HCC) Active Problems:   Essential hypertension   Hypothyroidism   Chronic kidney disease, stage III (moderate)   Hyperkalemia   Fall   Displaced fracture of left femoral neck (HCC)    WBAT LLE with walker DVT ppx: ASA 81 mg PO BID x6 weeks, SCDs, TEDs PT/OT PO pain control Dispo: SNF placement - please discuss d/c plan with Leonides Grills, RN Case Manager with [facility], hold [facility] for 6 months postop    SwinteckHorald Pollen 03/05/2016, 8:03 AM   Rod Can, MD Cell [phone]

## 2016-03-06 LAB — TYPE AND SCREEN
Blood Product Expiration Date: 201712232359
ISSUE DATE / TIME: 201712121240
Unit Type and Rh: 6200

## 2016-03-07 ENCOUNTER — Non-Acute Institutional Stay (SKILLED_NURSING_FACILITY): Payer: Medicare Other | Admitting: Internal Medicine

## 2016-03-07 ENCOUNTER — Encounter: Payer: Self-pay | Admitting: Internal Medicine

## 2016-03-07 DIAGNOSIS — D631 Anemia in chronic kidney disease: Secondary | ICD-10-CM

## 2016-03-07 DIAGNOSIS — N183 Chronic kidney disease, stage 3 unspecified: Secondary | ICD-10-CM

## 2016-03-07 DIAGNOSIS — E1149 Type 2 diabetes mellitus with other diabetic neurological complication: Secondary | ICD-10-CM

## 2016-03-07 DIAGNOSIS — S72002A Fracture of unspecified part of neck of left femur, initial encounter for closed fracture: Secondary | ICD-10-CM

## 2016-03-07 NOTE — Progress Notes (Signed)
Facility Location: [facility]  Room Number: 107  Code Status: Full Code  PCP: [name], MD [address] [place] [address]   This is a comprehensive admission note to [facility] performed on this date less than 30 days from date of admission. Included are preadmission medical/surgical history;reconciled medication list; family history; social history and comprehensive review of systems.  Corrections and additions to the records were documented . Comprehensive physical exam was also performed. Additionally a clinical summary was entered for each active diagnosis pertinent to this admission in the Problem List to enhance continuity of care.   HPI: The patient was hospitalized 12/7-12/12/17 for fracture of the left hip sustained in a mechanical fall. She denies any cardio/neurologic prodrome, she states she simply slipped from the bed in the dark. She denies any symptoms of hypoglycemia prior to the fall. ORIF was performed 03/02/16.Postoperatively  1 unit of packed cells was administered 12/12.  Aranesp injections are maintenance therapy. Aspirin 81 mg twice a day was recommended for 6 weeks as prophylaxis. Angiotensin receptor blocker was held because of acute kidney injury  attributed to dehydration. Renal function improved with gentle hydration ;renal ultrasound revealed no acute process. Janumet was resumed @ discharge. SSI was administered while hospitalized. Hypertension was controlled, beta blocker was continued but ARB was held @ discharge. On 12/11 creatinine was 1.04 and GFR 52. Posttransfusion 12/12 hemoglobin 11/hematocrit 34.5  with normocytic, hypochromic indices. A1c is current, it was 5.8% on 10/27.   Past medical and surgical history: Includes hypothyroidism, psoriasis, iron deficiency anemia the context of renal disease, hypertension, dyslipidemia, GERD, and diabetes. Surgeries include cervical disc  surgery, breast biopsy and cardiac catheterization.  Social history: Nonsmoker nondrinker  Family history: Reviewed, noncontributory  Review of systems: Fasting blood sugars at home were 89-110. Approximately once a month she'll have hypoglycemia with sugars 55-60. She describes intermittent numbness and tingling in her lower extremities. Ophthalmologic exam is up-to-date. She admits to some anxiety. Pain is well controlled, by history she's had 1 narcotic pill last 24 hours. Constitutional: No fever,significant weight change, fatigue  Eyes: No redness, discharge, pain, vision change ENT/mouth: No nasal congestion,  purulent discharge, earache,change in hearing ,sore throat  Cardiovascular: No chest pain, palpitations,paroxysmal nocturnal dyspnea, claudication, edema  Respiratory: No cough, sputum production,hemoptysis, DOE , significant snoring,apnea   Gastrointestinal: No heartburn,dysphagia,abdominal pain, nausea / vomiting,rectal bleeding, melena,change in bowels Genitourinary: No dysuria,hematuria, pyuria,  incontinence, nocturia Dermatologic: No rash, pruritus, change in appearance of skin Neurologic: No dizziness,headache,syncope, seizures Psychiatric: No significant depression, insomnia, anorexia Endocrine: No change in hair/skin/ nails, excessive thirst, excessive hunger, excessive urination  Hematologic/lymphatic: No significant bruising, lymphadenopathy,abnormal bleeding Allergy/immunology: No itchy/ watery eyes, significant sneezing, urticaria, angioedema  Physical exam:  Pertinent or positive findings: Pedal pulses are decreased. General appearance:thin but adequately nourished; no acute distress , increased work of breathing is present.   Lymphatic: No lymphadenopathy about the head, neck, axilla . Eyes: No conjunctival inflammation or lid edema is present. There is no scleral icterus. Ears:  External ear exam shows no significant lesions or deformities.   Nose:  External  nasal examination shows no deformity or inflammation. Nasal mucosa are pink and moist without lesions ,exudates Oral exam: lips and gums are healthy appearing.There is no oropharyngeal erythema or exudate . Neck:  No thyromegaly, masses, tenderness noted.    Heart:  Normal rate and regular rhythm. S1 and S2 normal without gallop, murmur, click, rub .  Lungs:Chest clear to auscultation without wheezes, rhonchi,rales , rubs. Abdomen:Bowel sounds are normal. Abdomen is soft and nontender with no organomegaly, hernias,masses. GU: deferred as previously addressed. Extremities:  No cyanosis, clubbing,edema  Neurologic exam : Strength equal  in upper extremities not tested in lower extremities due to fracture Balance,Rhomberg,finger to nose testing could not be completed due to clinical state Deep tendon reflexes are equal but 0-1/2+ at the knees Skin: Warm & dry w/o tenting. No significant lesions or rash.  See clinical summary under each active problem in the Problem List with associated updated therapeutic plan

## 2016-03-07 NOTE — Patient Instructions (Signed)
See Current Assessment & Plan in Problem List under specific Diagnosis 

## 2016-03-07 NOTE — Assessment & Plan Note (Signed)
Anemia exacerbated by postoperative blood loss for which  1 unit of packed cells was transfused 12/12. Hemodynamically she is stable

## 2016-03-07 NOTE — Assessment & Plan Note (Signed)
03/07/16 Progressing in PT Pain control is excellent, by history she's taken one narcotic in the last 24 hours

## 2016-03-07 NOTE — Assessment & Plan Note (Signed)
Risk of hypoglycemia discussed with patient Metformin resumed as renal function improved

## 2016-03-08 ENCOUNTER — Non-Acute Institutional Stay (SKILLED_NURSING_FACILITY): Payer: Medicare Other | Admitting: Nurse Practitioner

## 2016-03-08 ENCOUNTER — Encounter: Payer: Self-pay | Admitting: Nurse Practitioner

## 2016-03-08 DIAGNOSIS — D631 Anemia in chronic kidney disease: Secondary | ICD-10-CM | POA: Diagnosis not present

## 2016-03-08 DIAGNOSIS — N183 Chronic kidney disease, stage 3 unspecified: Secondary | ICD-10-CM

## 2016-03-08 DIAGNOSIS — I10 Essential (primary) hypertension: Secondary | ICD-10-CM

## 2016-03-08 DIAGNOSIS — E1149 Type 2 diabetes mellitus with other diabetic neurological complication: Secondary | ICD-10-CM | POA: Diagnosis not present

## 2016-03-08 DIAGNOSIS — S72002D Fracture of unspecified part of neck of left femur, subsequent encounter for closed fracture with routine healing: Secondary | ICD-10-CM | POA: Diagnosis not present

## 2016-03-08 NOTE — Progress Notes (Signed)
Nursing Home Location:  [facility]  Place of Service: SNF (31)  PCP: [name], MD  Allergies  Allergen Reactions  . Atorvastatin Rash  . Ezetimibe Itching    Chief Complaint  Patient presents with  . Discharge Note    HPI:  Patient is a 70 y.o. female seen today at [facility] for discharge home. Pt with medical history significant of HTN, HLD, DM type 2, and chronic anemia(on Aranesp injections).The patient was hospitalized 12/7-12/12/17 for fracture of the left hip sustained in a mechanical fall after she slipped from the bed in the dark.  ORIF was performed 03/02/16. Postop 1 unit of packed cells was administered 12/12.  Aspirin 81 mg twice a day was recommended for 6 weeks as prophylaxis. Pain is well controlled on current regimen. No side effects noted. Patient currently doing well with therapy, now stable to discharge home with home health.   Review of Systems:  Review of Systems  Constitutional: Negative for activity change, appetite change, fatigue and unexpected weight change.  HENT: Negative for congestion and hearing loss.   Eyes: Negative.   Respiratory: Negative for cough and shortness of breath.   Cardiovascular: Negative for chest pain, palpitations and leg swelling.  Gastrointestinal: Negative for abdominal pain, constipation and diarrhea.  Genitourinary: Negative for difficulty urinating and dysuria.  Musculoskeletal: Negative for arthralgias and myalgias.       Pain to left hip, relieved by medication  Skin: Negative for color change and wound.  Neurological: Negative for dizziness and weakness.  Psychiatric/Behavioral: Negative for agitation, behavioral problems and confusion.    Past Medical History:  Diagnosis Date  . 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 Anxiety   . Arthritis   . Bladder irritation   . Diabetes mellitus   . GERD (gastroesophageal reflux disease)   . Hypercholesterolemia   . Hypertension     . Hypothyroidism   . Iron deficiency anemia, unspecified   . Psoriasis   . Transfusion history 03/05/2016   for postoperative (ORIF) anemia superimposed on chronic anemia   Past Surgical History:  Procedure Laterality Date  . ABDOMINAL HYSTERECTOMY     TAH  . BREAST BIOPSY     Left breast biopsy benign lesion  . BREAST BIOPSY  03/11/2012   Procedure: BREAST BIOPSY;  Surgeon: Odis Hollingshead, MD;  Location: [facility];  Service: General;  Laterality: Left;  remove left breast mass  . CARDIAC CATHETERIZATION N/A 01/02/2015   Procedure: Left Heart Cath and Coronary Angiography;  Surgeon: Charolette Forward, MD;  Location: [facility];  Service: Cardiovascular;  Laterality: N/A;  . CATARACT EXTRACTION    . CERVICAL DISC SURGERY    . COLONOSCOPY  2013   neg. next 2023.   [facility] EYE SURGERY    . TOTAL HIP ARTHROPLASTY Left 03/02/2016   Procedure: TOTAL HIP ARTHROPLASTY ANTERIOR APPROACH;  Surgeon: Rod Can, MD;  Location: [facility];  Service: Orthopedics;  Laterality: Left;   Social History:   reports that she has never smoked. She has never used smokeless tobacco. She reports that she does not drink alcohol or use drugs.  Family History  Problem Relation Age of Onset  . Heart disease Mother     heart attack  . Stroke Father     brain hemorrhage  . Diabetes Brother   . Cancer Brother   . Breast cancer Sister   . Cancer Sister     breast cancer    Medications:  Patient's Medications  New Prescriptions   No medications on file  Previous Medications   ASPIRIN 81 MG EC TABLET    Take 1 tablet (81 mg total) by mouth 2 (two) times daily with a meal. For 6 weeks   BLOOD GLUCOSE MONITORING SUPPL (ONE TOUCH ULTRA 2) W/DEVICE KIT    Use to check blood sugars 1 time per day   CALCIUM CITRATE (CALCITRATE - DOSED IN MG ELEMENTAL CALCIUM) 950 MG TABLET    Take 200 mg of elemental calcium by mouth daily. 1/2 tab   GABAPENTIN (NEURONTIN) 300 MG CAPSULE    Take 300 mg by mouth 4 (four) times  daily.   GLYSET 50 MG TABLET    Take 1 tablet (50 mg total) by mouth 2 (two) times daily. Must Make Office Visit!!!   HYDROCODONE-ACETAMINOPHEN (NORCO/VICODIN) 5-325 MG TABLET    Take 1-2 tablets by mouth every 6 (six) hours as needed for moderate pain.   IRON POLYSACCHARIDES (NIFEREX) 150 MG CAPSULE    Take 1 capsule (150 mg total) by mouth daily.   ISOSORBIDE MONONITRATE (IMDUR) 60 MG 24 HR TABLET    Take 1 tablet (60 mg total) by mouth daily.   LEVOTHYROXINE (SYNTHROID, LEVOTHROID) 75 MCG TABLET    Take 1 tablet (75 mcg total) by mouth daily.   METOPROLOL SUCCINATE (TOPROL-XL) 50 MG 24 HR TABLET    Take 1 tablet (50 mg total) by mouth daily. Take with or immediately following a meal.   MYRBETRIQ 50 MG TB24 TABLET    Take 50 mg by mouth daily.   NITROGLYCERIN (NITROSTAT) 0.4 MG SL TABLET    Place 1 tablet (0.4 mg total) under the tongue every 5 (five) minutes x 3 doses as needed for chest pain.   PENTOSAN POLYSULFATE (ELMIRON) 100 MG CAPSULE    Take 100 mg by mouth 3 (three) times daily before meals.    SITAGLIPTIN-METFORMIN (JANUMET) 50-500 MG TABLET    Take 1 tablet by mouth 2 (two) times daily with a meal.  Modified Medications   No medications on file  Discontinued Medications   No medications on file     Physical Exam: Vitals:   03/08/16 0952  BP: 107/65  Pulse: 84  Resp: 19  Temp: 98.2 F (36.8 C)  Weight: 113 lb (51.3 kg)  Height: '5\' 1"'$  (1.549 m)    Physical Exam  Constitutional: She is oriented to person, place, and time. She appears well-developed and well-nourished. No distress.  HENT:  Head: Normocephalic and atraumatic.  Mouth/Throat: Oropharynx is clear and moist. No oropharyngeal exudate.  Eyes: Conjunctivae are normal. Pupils are equal, round, and reactive to light.  Neck: Normal range of motion. Neck supple.  Cardiovascular: Normal rate, regular rhythm and normal heart sounds.   Pulmonary/Chest: Effort normal and breath sounds normal.  Abdominal: Soft. Bowel  sounds are normal.  Musculoskeletal: She exhibits no edema or tenderness.  aquacel dressing to right upper leg, no drainage, warmth or heat noted  Neurological: She is alert and oriented to person, place, and time.  Skin: Skin is warm and dry. She is not diaphoretic.  Psychiatric: She has a normal mood and affect.    Labs reviewed: Basic Metabolic Panel:  Recent Labs  03/02/16 0359 03/03/16 0335 03/03/16 0750 03/04/16 0528  NA 139 136 138 140  K 4.3 4.8 4.4 4.2  CL 108 105 107 108  CO2 '25 24 25 23  '$ GLUCOSE 116* 146* 101* 148*  BUN '17 19 18 '$ 18  CREATININE 1.09* 1.15* 1.21* 1.04*  CALCIUM 7.5* 7.0* 7.2* 8.2*  MG 1.4* 2.2  --   --    Liver Function Tests:  Recent Labs  12/12/15 1436 03/02/16 0733 03/03/16 0750  AST 18 47* 32  ALT 15 28 11*  ALKPHOS 74 56 51  BILITOT <0.30 0.8 0.7  PROT 6.2* 5.2* 4.8*  ALBUMIN 3.4* 3.0* 2.4*   No results for input(s): LIPASE, AMYLASE in the last 8760 hours. No results for input(s): AMMONIA in the last 8760 hours. CBC:  Recent Labs  01/30/16 1353 02/29/16 2034  03/04/16 0528 03/05/16 0432 03/05/16 1718  WBC 5.9 10.1  < > 6.5 6.5 7.1  NEUTROABS 3.1 7.6  --   --   --  4.3  HGB 12.1 12.3  < > 7.9* 7.5* 11.0*  HCT 40.0 40.4  < > 25.4* 24.9* 34.5*  MCV 80.8 80.2  < > 77.2* 79.3 79.1  PLT 252 216  < > 159 156 170  < > = values in this interval not displayed. TSH:  Recent Labs  07/13/15 1356 01/19/16 1141  TSH 0.89 0.32*   A1C: Lab Results  Component Value Date   HGBA1C 5.8 01/19/2016   Lipid Panel:  Recent Labs  01/19/16 1141  CHOL 150  HDL 70.60  LDLCALC 60  TRIG 97.0  CHOLHDL 2    Radiological Exams: Dg Elbow Complete Left (3+view)  Result Date: 03/01/2016 CLINICAL DATA:  Left elbow pain with fall from bed, hip fx, concern for elbow fx as well EXAM: LEFT ELBOW - COMPLETE 3+ VIEW COMPARISON:  01/09/2009 FINDINGS: There is no evidence of fracture, dislocation, or joint effusion. Soft tissues are  unremarkable. No joint effusion. Bones appear osteopenic. IMPRESSION: No evidence for acute  abnormality. Electronically Signed   By: Nolon Nations M.D.   On: 03/01/2016 09:32   Dg Chest Portable 1 View  Result Date: 02/29/2016 CLINICAL DATA:  Left hip fracture. EXAM: PORTABLE CHEST 1 VIEW COMPARISON:  01/02/2015 FINDINGS: A single AP portable view of the chest demonstrates no focal airspace consolidation or alveolar edema. The lungs are grossly clear. There is no large effusion or pneumothorax. Cardiac and mediastinal contours appear unremarkable. IMPRESSION: No active disease. Electronically Signed   By: Andreas Newport M.D.   On: 02/29/2016 22:53   Dg Shoulder Left  Result Date: 03/01/2016 CLINICAL DATA:  LEFT shoulder pain and swelling after fall today. History of arthritis. EXAM: LEFT SHOULDER - 2+ VIEW COMPARISON:  None. FINDINGS: There is no evidence of fracture or dislocation. There is no evidence of advanced arthropathy or other focal bone abnormality. Mild spurring of the humeral head. Osteopenia. Soft tissues are unremarkable. IMPRESSION: No acute fracture deformity or dislocation. Osteopenia, decreasing sensitivity for acute nondisplaced fractures. Electronically Signed   By: Elon Alas M.D.   On: 03/01/2016 01:36   Dg Knee Left Port  Result Date: 02/29/2016 CLINICAL DATA:  Left knee pain without trauma.  One year duration. EXAM: PORTABLE LEFT KNEE - 1-2 VIEW COMPARISON:  None. FINDINGS: Negative for acute fracture or dislocation. No bone lesion or bony destruction. No joint effusion. IMPRESSION: Negative. Electronically Signed   By: Andreas Newport M.D.   On: 02/29/2016 23:25   Dg Hip Unilat With Pelvis 2-3 Views Left  Result Date: 02/29/2016 CLINICAL DATA:  Left hip pain after fall today. EXAM: DG HIP (WITH OR WITHOUT PELVIS) 2-3V LEFT COMPARISON:  None. FINDINGS: Displaced left femoral neck fracture with angulation. No significant shortening. Femoral head remains seated  in the acetabulum. Pubic rami are intact. No additional acute fracture. IMPRESSION: Displaced left femoral neck fracture with angulation. Electronically Signed   By: Jeb Levering M.D.   On: 02/29/2016 21:53   Assessment/Plan 1. Closed fracture of neck of left femur with routine healing, subsequent encounter Doing well with therapy. Pain controlled on current regimen. conts on ASA 81 mg BID for 6 weeks and follow up with ortho.   2. Anemia due to stage 3 (moderate) chronic renal failure treated with erythropoietin Chronic anemia which she receives aranesp injections. Stable on dc from hospital. Will need follow up with Dr Alvy Bimler for ongoing monitoring and aranesp.   3. Type 2 diabetes mellitus with neurological manifestations, controlled ([place]) Stable at this time. Will dc on home regimen.   4. Chronic kidney disease, stage III (moderate) Had improved on discharge from hospital. Will need follow up bmp by PCP  5. Essential hypertension Blood pressure stable, conts on lopressor and imdur, ARB not resumed at discharge due to renal function, will need follow up by PCP  pt is stable for discharge-will need PT/OT per home health. DME needed includes bsc and tub bench. Pt reports she does not need Rx for home medications, has recently had filled, will write Rx for pain medication # 30 tablets.  will need to follow up with PCP within 2 weeks.     Carlos American. Harle Battiest  [facility] [phone] 8 am - 5 pm) [phone] (after hours)

## 2016-03-12 DIAGNOSIS — E119 Type 2 diabetes mellitus without complications: Secondary | ICD-10-CM | POA: Diagnosis not present

## 2016-03-12 DIAGNOSIS — W19XXXD Unspecified fall, subsequent encounter: Secondary | ICD-10-CM | POA: Diagnosis not present

## 2016-03-12 DIAGNOSIS — K219 Gastro-esophageal reflux disease without esophagitis: Secondary | ICD-10-CM | POA: Diagnosis not present

## 2016-03-12 DIAGNOSIS — E039 Hypothyroidism, unspecified: Secondary | ICD-10-CM | POA: Diagnosis not present

## 2016-03-12 DIAGNOSIS — I10 Essential (primary) hypertension: Secondary | ICD-10-CM | POA: Diagnosis not present

## 2016-03-12 DIAGNOSIS — S72142D Displaced intertrochanteric fracture of left femur, subsequent encounter for closed fracture with routine healing: Secondary | ICD-10-CM | POA: Diagnosis not present

## 2016-03-13 DIAGNOSIS — K219 Gastro-esophageal reflux disease without esophagitis: Secondary | ICD-10-CM | POA: Diagnosis not present

## 2016-03-13 DIAGNOSIS — I10 Essential (primary) hypertension: Secondary | ICD-10-CM | POA: Diagnosis not present

## 2016-03-13 DIAGNOSIS — W19XXXD Unspecified fall, subsequent encounter: Secondary | ICD-10-CM | POA: Diagnosis not present

## 2016-03-13 DIAGNOSIS — E119 Type 2 diabetes mellitus without complications: Secondary | ICD-10-CM | POA: Diagnosis not present

## 2016-03-13 DIAGNOSIS — E039 Hypothyroidism, unspecified: Secondary | ICD-10-CM | POA: Diagnosis not present

## 2016-03-13 DIAGNOSIS — S72142D Displaced intertrochanteric fracture of left femur, subsequent encounter for closed fracture with routine healing: Secondary | ICD-10-CM | POA: Diagnosis not present

## 2016-03-15 ENCOUNTER — Other Ambulatory Visit: Payer: Self-pay | Admitting: Orthopedic Surgery

## 2016-03-15 ENCOUNTER — Other Ambulatory Visit: Payer: Self-pay | Admitting: Endocrinology

## 2016-03-15 DIAGNOSIS — E039 Hypothyroidism, unspecified: Secondary | ICD-10-CM | POA: Diagnosis not present

## 2016-03-15 DIAGNOSIS — M25512 Pain in left shoulder: Secondary | ICD-10-CM | POA: Diagnosis not present

## 2016-03-15 DIAGNOSIS — I10 Essential (primary) hypertension: Secondary | ICD-10-CM | POA: Diagnosis not present

## 2016-03-15 DIAGNOSIS — W19XXXD Unspecified fall, subsequent encounter: Secondary | ICD-10-CM | POA: Diagnosis not present

## 2016-03-15 DIAGNOSIS — K219 Gastro-esophageal reflux disease without esophagitis: Secondary | ICD-10-CM | POA: Diagnosis not present

## 2016-03-15 DIAGNOSIS — Z471 Aftercare following joint replacement surgery: Secondary | ICD-10-CM | POA: Diagnosis not present

## 2016-03-15 DIAGNOSIS — S72142D Displaced intertrochanteric fracture of left femur, subsequent encounter for closed fracture with routine healing: Secondary | ICD-10-CM | POA: Diagnosis not present

## 2016-03-15 DIAGNOSIS — E119 Type 2 diabetes mellitus without complications: Secondary | ICD-10-CM | POA: Diagnosis not present

## 2016-03-15 DIAGNOSIS — Z96642 Presence of left artificial hip joint: Secondary | ICD-10-CM | POA: Diagnosis not present

## 2016-03-20 DIAGNOSIS — E119 Type 2 diabetes mellitus without complications: Secondary | ICD-10-CM | POA: Diagnosis not present

## 2016-03-20 DIAGNOSIS — W19XXXD Unspecified fall, subsequent encounter: Secondary | ICD-10-CM | POA: Diagnosis not present

## 2016-03-20 DIAGNOSIS — E039 Hypothyroidism, unspecified: Secondary | ICD-10-CM | POA: Diagnosis not present

## 2016-03-20 DIAGNOSIS — S72142D Displaced intertrochanteric fracture of left femur, subsequent encounter for closed fracture with routine healing: Secondary | ICD-10-CM | POA: Diagnosis not present

## 2016-03-20 DIAGNOSIS — I10 Essential (primary) hypertension: Secondary | ICD-10-CM | POA: Diagnosis not present

## 2016-03-20 DIAGNOSIS — K219 Gastro-esophageal reflux disease without esophagitis: Secondary | ICD-10-CM | POA: Diagnosis not present

## 2016-03-21 DIAGNOSIS — S72142D Displaced intertrochanteric fracture of left femur, subsequent encounter for closed fracture with routine healing: Secondary | ICD-10-CM | POA: Diagnosis not present

## 2016-03-21 DIAGNOSIS — E039 Hypothyroidism, unspecified: Secondary | ICD-10-CM | POA: Diagnosis not present

## 2016-03-21 DIAGNOSIS — W19XXXD Unspecified fall, subsequent encounter: Secondary | ICD-10-CM | POA: Diagnosis not present

## 2016-03-21 DIAGNOSIS — E119 Type 2 diabetes mellitus without complications: Secondary | ICD-10-CM | POA: Diagnosis not present

## 2016-03-21 DIAGNOSIS — K219 Gastro-esophageal reflux disease without esophagitis: Secondary | ICD-10-CM | POA: Diagnosis not present

## 2016-03-21 DIAGNOSIS — I10 Essential (primary) hypertension: Secondary | ICD-10-CM | POA: Diagnosis not present

## 2016-03-26 ENCOUNTER — Other Ambulatory Visit: Payer: Self-pay | Admitting: Endocrinology

## 2016-03-26 ENCOUNTER — Other Ambulatory Visit (HOSPITAL_BASED_OUTPATIENT_CLINIC_OR_DEPARTMENT_OTHER): Payer: Medicare Other

## 2016-03-26 ENCOUNTER — Ambulatory Visit: Payer: Medicare Other

## 2016-03-26 DIAGNOSIS — S72002D Fracture of unspecified part of neck of left femur, subsequent encounter for closed fracture with routine healing: Secondary | ICD-10-CM | POA: Diagnosis not present

## 2016-03-26 DIAGNOSIS — D6489 Other specified anemias: Secondary | ICD-10-CM | POA: Diagnosis not present

## 2016-03-26 DIAGNOSIS — N183 Chronic kidney disease, stage 3 (moderate): Secondary | ICD-10-CM | POA: Diagnosis not present

## 2016-03-26 DIAGNOSIS — D631 Anemia in chronic kidney disease: Secondary | ICD-10-CM | POA: Diagnosis not present

## 2016-03-26 DIAGNOSIS — I10 Essential (primary) hypertension: Secondary | ICD-10-CM | POA: Diagnosis not present

## 2016-03-26 LAB — CBC WITH DIFFERENTIAL/PLATELET
BASO%: 0.2 % (ref 0.0–2.0)
Basophils Absolute: 0 10*3/uL (ref 0.0–0.1)
EOS%: 2.3 % (ref 0.0–7.0)
Eosinophils Absolute: 0.1 10*3/uL (ref 0.0–0.5)
HCT: 42.9 % (ref 34.8–46.6)
HGB: 13.1 g/dL (ref 11.6–15.9)
LYMPH%: 24.1 % (ref 14.0–49.7)
MCH: 25.4 pg (ref 25.1–34.0)
MCHC: 30.5 g/dL — ABNORMAL LOW (ref 31.5–36.0)
MCV: 83.1 fL (ref 79.5–101.0)
MONO#: 0.3 10*3/uL (ref 0.1–0.9)
MONO%: 5.5 % (ref 0.0–14.0)
NEUT#: 4.1 10*3/uL (ref 1.5–6.5)
NEUT%: 67.9 % (ref 38.4–76.8)
Platelets: 250 10*3/uL (ref 145–400)
RBC: 5.16 10*6/uL (ref 3.70–5.45)
RDW: 17.2 % — ABNORMAL HIGH (ref 11.2–14.5)
WBC: 6 10*3/uL (ref 3.9–10.3)
lymph#: 1.5 10*3/uL (ref 0.9–3.3)

## 2016-03-26 NOTE — Progress Notes (Signed)
Hemoglobin noted at 13.1 on lab today. No injection needed per protocol order.  Pt and family member given copy of lab and current schedule. Pt instructed to follow schedule and to call office should issues occur. Pt and family member verbalized understanding of instructions.

## 2016-03-28 DIAGNOSIS — W19XXXD Unspecified fall, subsequent encounter: Secondary | ICD-10-CM | POA: Diagnosis not present

## 2016-03-28 DIAGNOSIS — I10 Essential (primary) hypertension: Secondary | ICD-10-CM | POA: Diagnosis not present

## 2016-03-28 DIAGNOSIS — E039 Hypothyroidism, unspecified: Secondary | ICD-10-CM | POA: Diagnosis not present

## 2016-03-28 DIAGNOSIS — K219 Gastro-esophageal reflux disease without esophagitis: Secondary | ICD-10-CM | POA: Diagnosis not present

## 2016-03-28 DIAGNOSIS — S72142D Displaced intertrochanteric fracture of left femur, subsequent encounter for closed fracture with routine healing: Secondary | ICD-10-CM | POA: Diagnosis not present

## 2016-03-28 DIAGNOSIS — E119 Type 2 diabetes mellitus without complications: Secondary | ICD-10-CM | POA: Diagnosis not present

## 2016-03-29 ENCOUNTER — Ambulatory Visit
Admission: RE | Admit: 2016-03-29 | Discharge: 2016-03-29 | Disposition: A | Discharge status: Home / Self Care | Payer: Medicare Other | Source: Ambulatory Visit | Attending: Orthopedic Surgery | Admitting: Orthopedic Surgery

## 2016-03-29 DIAGNOSIS — M25512 Pain in left shoulder: Secondary | ICD-10-CM

## 2016-03-30 DIAGNOSIS — E039 Hypothyroidism, unspecified: Secondary | ICD-10-CM | POA: Diagnosis not present

## 2016-03-30 DIAGNOSIS — I10 Essential (primary) hypertension: Secondary | ICD-10-CM | POA: Diagnosis not present

## 2016-03-30 DIAGNOSIS — K219 Gastro-esophageal reflux disease without esophagitis: Secondary | ICD-10-CM | POA: Diagnosis not present

## 2016-03-30 DIAGNOSIS — W19XXXD Unspecified fall, subsequent encounter: Secondary | ICD-10-CM | POA: Diagnosis not present

## 2016-03-30 DIAGNOSIS — E119 Type 2 diabetes mellitus without complications: Secondary | ICD-10-CM | POA: Diagnosis not present

## 2016-03-30 DIAGNOSIS — S72142D Displaced intertrochanteric fracture of left femur, subsequent encounter for closed fracture with routine healing: Secondary | ICD-10-CM | POA: Diagnosis not present

## 2016-04-01 DIAGNOSIS — S72142D Displaced intertrochanteric fracture of left femur, subsequent encounter for closed fracture with routine healing: Secondary | ICD-10-CM | POA: Diagnosis not present

## 2016-04-01 DIAGNOSIS — E039 Hypothyroidism, unspecified: Secondary | ICD-10-CM | POA: Diagnosis not present

## 2016-04-01 DIAGNOSIS — W19XXXD Unspecified fall, subsequent encounter: Secondary | ICD-10-CM | POA: Diagnosis not present

## 2016-04-01 DIAGNOSIS — K219 Gastro-esophageal reflux disease without esophagitis: Secondary | ICD-10-CM | POA: Diagnosis not present

## 2016-04-01 DIAGNOSIS — I10 Essential (primary) hypertension: Secondary | ICD-10-CM | POA: Diagnosis not present

## 2016-04-01 DIAGNOSIS — E119 Type 2 diabetes mellitus without complications: Secondary | ICD-10-CM | POA: Diagnosis not present

## 2016-04-02 DIAGNOSIS — M67812 Other specified disorders of synovium, left shoulder: Secondary | ICD-10-CM | POA: Diagnosis not present

## 2016-04-02 DIAGNOSIS — S42215A Unspecified nondisplaced fracture of surgical neck of left humerus, initial encounter for closed fracture: Secondary | ICD-10-CM | POA: Diagnosis not present

## 2016-04-03 DIAGNOSIS — E119 Type 2 diabetes mellitus without complications: Secondary | ICD-10-CM | POA: Diagnosis not present

## 2016-04-03 DIAGNOSIS — I10 Essential (primary) hypertension: Secondary | ICD-10-CM | POA: Diagnosis not present

## 2016-04-03 DIAGNOSIS — E039 Hypothyroidism, unspecified: Secondary | ICD-10-CM | POA: Diagnosis not present

## 2016-04-03 DIAGNOSIS — S72142D Displaced intertrochanteric fracture of left femur, subsequent encounter for closed fracture with routine healing: Secondary | ICD-10-CM | POA: Diagnosis not present

## 2016-04-03 DIAGNOSIS — W19XXXD Unspecified fall, subsequent encounter: Secondary | ICD-10-CM | POA: Diagnosis not present

## 2016-04-03 DIAGNOSIS — K219 Gastro-esophageal reflux disease without esophagitis: Secondary | ICD-10-CM | POA: Diagnosis not present

## 2016-04-09 DIAGNOSIS — I10 Essential (primary) hypertension: Secondary | ICD-10-CM | POA: Diagnosis not present

## 2016-04-09 DIAGNOSIS — S72142D Displaced intertrochanteric fracture of left femur, subsequent encounter for closed fracture with routine healing: Secondary | ICD-10-CM | POA: Diagnosis not present

## 2016-04-09 DIAGNOSIS — E119 Type 2 diabetes mellitus without complications: Secondary | ICD-10-CM | POA: Diagnosis not present

## 2016-04-09 DIAGNOSIS — W19XXXD Unspecified fall, subsequent encounter: Secondary | ICD-10-CM | POA: Diagnosis not present

## 2016-04-09 DIAGNOSIS — E039 Hypothyroidism, unspecified: Secondary | ICD-10-CM | POA: Diagnosis not present

## 2016-04-09 DIAGNOSIS — K219 Gastro-esophageal reflux disease without esophagitis: Secondary | ICD-10-CM | POA: Diagnosis not present

## 2016-04-12 DIAGNOSIS — W19XXXD Unspecified fall, subsequent encounter: Secondary | ICD-10-CM | POA: Diagnosis not present

## 2016-04-12 DIAGNOSIS — E119 Type 2 diabetes mellitus without complications: Secondary | ICD-10-CM | POA: Diagnosis not present

## 2016-04-12 DIAGNOSIS — S72142D Displaced intertrochanteric fracture of left femur, subsequent encounter for closed fracture with routine healing: Secondary | ICD-10-CM | POA: Diagnosis not present

## 2016-04-12 DIAGNOSIS — K219 Gastro-esophageal reflux disease without esophagitis: Secondary | ICD-10-CM | POA: Diagnosis not present

## 2016-04-12 DIAGNOSIS — I10 Essential (primary) hypertension: Secondary | ICD-10-CM | POA: Diagnosis not present

## 2016-04-12 DIAGNOSIS — E039 Hypothyroidism, unspecified: Secondary | ICD-10-CM | POA: Diagnosis not present

## 2016-04-15 DIAGNOSIS — E119 Type 2 diabetes mellitus without complications: Secondary | ICD-10-CM | POA: Diagnosis not present

## 2016-04-15 DIAGNOSIS — I10 Essential (primary) hypertension: Secondary | ICD-10-CM | POA: Diagnosis not present

## 2016-04-15 DIAGNOSIS — W19XXXD Unspecified fall, subsequent encounter: Secondary | ICD-10-CM | POA: Diagnosis not present

## 2016-04-15 DIAGNOSIS — E039 Hypothyroidism, unspecified: Secondary | ICD-10-CM | POA: Diagnosis not present

## 2016-04-15 DIAGNOSIS — K219 Gastro-esophageal reflux disease without esophagitis: Secondary | ICD-10-CM | POA: Diagnosis not present

## 2016-04-15 DIAGNOSIS — S72142D Displaced intertrochanteric fracture of left femur, subsequent encounter for closed fracture with routine healing: Secondary | ICD-10-CM | POA: Diagnosis not present

## 2016-04-16 DIAGNOSIS — S72002D Fracture of unspecified part of neck of left femur, subsequent encounter for closed fracture with routine healing: Secondary | ICD-10-CM | POA: Diagnosis not present

## 2016-04-16 DIAGNOSIS — S42295D Other nondisplaced fracture of upper end of left humerus, subsequent encounter for fracture with routine healing: Secondary | ICD-10-CM | POA: Diagnosis not present

## 2016-04-17 DIAGNOSIS — E119 Type 2 diabetes mellitus without complications: Secondary | ICD-10-CM | POA: Diagnosis not present

## 2016-04-17 DIAGNOSIS — E039 Hypothyroidism, unspecified: Secondary | ICD-10-CM | POA: Diagnosis not present

## 2016-04-17 DIAGNOSIS — S72142D Displaced intertrochanteric fracture of left femur, subsequent encounter for closed fracture with routine healing: Secondary | ICD-10-CM | POA: Diagnosis not present

## 2016-04-17 DIAGNOSIS — I10 Essential (primary) hypertension: Secondary | ICD-10-CM | POA: Diagnosis not present

## 2016-04-17 DIAGNOSIS — W19XXXD Unspecified fall, subsequent encounter: Secondary | ICD-10-CM | POA: Diagnosis not present

## 2016-04-17 DIAGNOSIS — K219 Gastro-esophageal reflux disease without esophagitis: Secondary | ICD-10-CM | POA: Diagnosis not present

## 2016-04-18 ENCOUNTER — Other Ambulatory Visit (INDEPENDENT_AMBULATORY_CARE_PROVIDER_SITE_OTHER): Payer: Medicare Other

## 2016-04-18 ENCOUNTER — Other Ambulatory Visit: Payer: Self-pay | Admitting: Gastroenterology

## 2016-04-18 DIAGNOSIS — M810 Age-related osteoporosis without current pathological fracture: Secondary | ICD-10-CM

## 2016-04-18 DIAGNOSIS — E119 Type 2 diabetes mellitus without complications: Secondary | ICD-10-CM

## 2016-04-18 DIAGNOSIS — E038 Other specified hypothyroidism: Secondary | ICD-10-CM

## 2016-04-18 DIAGNOSIS — E063 Autoimmune thyroiditis: Secondary | ICD-10-CM

## 2016-04-18 LAB — MICROALBUMIN / CREATININE URINE RATIO
Creatinine,U: 89.4 mg/dL
Microalb Creat Ratio: 1.1 mg/g (ref 0.0–30.0)
Microalb, Ur: 1 mg/dL (ref 0.0–1.9)

## 2016-04-18 LAB — COMPREHENSIVE METABOLIC PANEL
ALT: 9 U/L (ref 0–35)
AST: 17 U/L (ref 0–37)
Albumin: 4 g/dL (ref 3.5–5.2)
Alkaline Phosphatase: 77 U/L (ref 39–117)
BUN: 24 mg/dL — ABNORMAL HIGH (ref 6–23)
CO2: 30 mEq/L (ref 19–32)
Calcium: 10.4 mg/dL (ref 8.4–10.5)
Chloride: 99 mEq/L (ref 96–112)
Creatinine, Ser: 1.34 mg/dL — ABNORMAL HIGH (ref 0.40–1.20)
GFR: 41.21 mL/min — ABNORMAL LOW (ref >60.00)
Glucose, Bld: 146 mg/dL — ABNORMAL HIGH (ref 70–99)
Potassium: 4.7 mEq/L (ref 3.5–5.1)
Sodium: 135 mEq/L (ref 135–145)
Total Bilirubin: 0.4 mg/dL (ref 0.2–1.2)
Total Protein: 6.4 g/dL (ref 6.0–8.3)

## 2016-04-18 LAB — URINALYSIS, ROUTINE W REFLEX MICROSCOPIC
Bilirubin Urine: NEGATIVE
Hgb urine dipstick: NEGATIVE
Ketones, ur: NEGATIVE
Nitrite: NEGATIVE
Specific Gravity, Urine: 1.01 (ref 1.000–1.030)
Total Protein, Urine: NEGATIVE
Urine Glucose: NEGATIVE
Urobilinogen, UA: 0.2 (ref 0.0–1.0)
pH: 6.5 (ref 5.0–8.0)

## 2016-04-18 LAB — TSH: TSH: 2.97 u[IU]/mL (ref 0.35–4.50)

## 2016-04-18 LAB — HEMOGLOBIN A1C: Hgb A1c MFr Bld: 6.9 % — ABNORMAL HIGH (ref 4.6–6.5)

## 2016-04-18 LAB — T4, FREE: Free T4: 1.07 ng/dL (ref 0.60–1.60)

## 2016-04-19 ENCOUNTER — Other Ambulatory Visit (INDEPENDENT_AMBULATORY_CARE_PROVIDER_SITE_OTHER): Payer: Medicare Other

## 2016-04-19 DIAGNOSIS — I10 Essential (primary) hypertension: Secondary | ICD-10-CM | POA: Diagnosis not present

## 2016-04-19 DIAGNOSIS — E1140 Type 2 diabetes mellitus with diabetic neuropathy, unspecified: Secondary | ICD-10-CM

## 2016-04-19 DIAGNOSIS — K219 Gastro-esophageal reflux disease without esophagitis: Secondary | ICD-10-CM | POA: Diagnosis not present

## 2016-04-19 DIAGNOSIS — E119 Type 2 diabetes mellitus without complications: Secondary | ICD-10-CM | POA: Diagnosis not present

## 2016-04-19 DIAGNOSIS — W19XXXD Unspecified fall, subsequent encounter: Secondary | ICD-10-CM | POA: Diagnosis not present

## 2016-04-19 DIAGNOSIS — E039 Hypothyroidism, unspecified: Secondary | ICD-10-CM | POA: Diagnosis not present

## 2016-04-19 DIAGNOSIS — S72142D Displaced intertrochanteric fracture of left femur, subsequent encounter for closed fracture with routine healing: Secondary | ICD-10-CM | POA: Diagnosis not present

## 2016-04-19 DIAGNOSIS — E1149 Type 2 diabetes mellitus with other diabetic neurological complication: Secondary | ICD-10-CM | POA: Diagnosis not present

## 2016-04-19 LAB — LIPID PANEL
Cholesterol: 171 mg/dL (ref 0–200)
HDL: 72.9 mg/dL (ref >39.00)
LDL Cholesterol: 75 mg/dL (ref 0–99)
NonHDL: 97.84
Total CHOL/HDL Ratio: 2
Triglycerides: 115 mg/dL (ref 0.0–149.0)
VLDL: 23 mg/dL (ref 0.0–40.0)

## 2016-04-23 ENCOUNTER — Other Ambulatory Visit (HOSPITAL_BASED_OUTPATIENT_CLINIC_OR_DEPARTMENT_OTHER): Payer: Medicare Other

## 2016-04-23 ENCOUNTER — Ambulatory Visit: Payer: Medicare Other

## 2016-04-23 DIAGNOSIS — W19XXXD Unspecified fall, subsequent encounter: Secondary | ICD-10-CM | POA: Diagnosis not present

## 2016-04-23 DIAGNOSIS — S72142D Displaced intertrochanteric fracture of left femur, subsequent encounter for closed fracture with routine healing: Secondary | ICD-10-CM | POA: Diagnosis not present

## 2016-04-23 DIAGNOSIS — I10 Essential (primary) hypertension: Secondary | ICD-10-CM | POA: Diagnosis not present

## 2016-04-23 DIAGNOSIS — D631 Anemia in chronic kidney disease: Secondary | ICD-10-CM

## 2016-04-23 DIAGNOSIS — N183 Chronic kidney disease, stage 3 (moderate): Secondary | ICD-10-CM

## 2016-04-23 DIAGNOSIS — E119 Type 2 diabetes mellitus without complications: Secondary | ICD-10-CM | POA: Diagnosis not present

## 2016-04-23 DIAGNOSIS — E039 Hypothyroidism, unspecified: Secondary | ICD-10-CM | POA: Diagnosis not present

## 2016-04-23 DIAGNOSIS — K219 Gastro-esophageal reflux disease without esophagitis: Secondary | ICD-10-CM | POA: Diagnosis not present

## 2016-04-23 LAB — CBC WITH DIFFERENTIAL/PLATELET
BASO%: 0.3 % (ref 0.0–2.0)
Basophils Absolute: 0 10*3/uL (ref 0.0–0.1)
EOS%: 6.6 % (ref 0.0–7.0)
Eosinophils Absolute: 0.5 10*3/uL (ref 0.0–0.5)
HCT: 36.8 % (ref 34.8–46.6)
HGB: 11.3 g/dL — ABNORMAL LOW (ref 11.6–15.9)
LYMPH%: 33.3 % (ref 14.0–49.7)
MCH: 25.2 pg (ref 25.1–34.0)
MCHC: 30.7 g/dL — ABNORMAL LOW (ref 31.5–36.0)
MCV: 82 fL (ref 79.5–101.0)
MONO#: 0.4 10*3/uL (ref 0.1–0.9)
MONO%: 6 % (ref 0.0–14.0)
NEUT#: 3.7 10*3/uL (ref 1.5–6.5)
NEUT%: 53.8 % (ref 38.4–76.8)
Platelets: 201 10*3/uL (ref 145–400)
RBC: 4.49 10*6/uL (ref 3.70–5.45)
RDW: 17.4 % — ABNORMAL HIGH (ref 11.2–14.5)
WBC: 7 10*3/uL (ref 3.9–10.3)
lymph#: 2.3 10*3/uL (ref 0.9–3.3)
nRBC: 0 % (ref 0–0)

## 2016-04-23 NOTE — Progress Notes (Signed)
Hgb noted at 11.3 on labs today. Injection not given per protocol order. Pt and family member given  Copy of current labs and schedule. Instructed to follow current schedule and to call office should issues occur. Verbalized understanding of instuctions

## 2016-04-25 ENCOUNTER — Other Ambulatory Visit: Payer: Medicare Other

## 2016-04-26 DIAGNOSIS — K219 Gastro-esophageal reflux disease without esophagitis: Secondary | ICD-10-CM | POA: Diagnosis not present

## 2016-04-26 DIAGNOSIS — W19XXXD Unspecified fall, subsequent encounter: Secondary | ICD-10-CM | POA: Diagnosis not present

## 2016-04-26 DIAGNOSIS — E039 Hypothyroidism, unspecified: Secondary | ICD-10-CM | POA: Diagnosis not present

## 2016-04-26 DIAGNOSIS — I10 Essential (primary) hypertension: Secondary | ICD-10-CM | POA: Diagnosis not present

## 2016-04-26 DIAGNOSIS — E119 Type 2 diabetes mellitus without complications: Secondary | ICD-10-CM | POA: Diagnosis not present

## 2016-04-26 DIAGNOSIS — S72142D Displaced intertrochanteric fracture of left femur, subsequent encounter for closed fracture with routine healing: Secondary | ICD-10-CM | POA: Diagnosis not present

## 2016-04-30 ENCOUNTER — Ambulatory Visit
Admission: RE | Admit: 2016-04-30 | Discharge: 2016-04-30 | Disposition: A | Discharge status: Home / Self Care | Payer: Medicare Other | Source: Ambulatory Visit | Attending: Gastroenterology | Admitting: Gastroenterology

## 2016-04-30 DIAGNOSIS — M810 Age-related osteoporosis without current pathological fracture: Secondary | ICD-10-CM

## 2016-04-30 DIAGNOSIS — S72142D Displaced intertrochanteric fracture of left femur, subsequent encounter for closed fracture with routine healing: Secondary | ICD-10-CM | POA: Diagnosis not present

## 2016-04-30 DIAGNOSIS — K219 Gastro-esophageal reflux disease without esophagitis: Secondary | ICD-10-CM | POA: Diagnosis not present

## 2016-04-30 DIAGNOSIS — E119 Type 2 diabetes mellitus without complications: Secondary | ICD-10-CM | POA: Diagnosis not present

## 2016-04-30 DIAGNOSIS — I10 Essential (primary) hypertension: Secondary | ICD-10-CM | POA: Diagnosis not present

## 2016-04-30 DIAGNOSIS — Z780 Asymptomatic menopausal state: Secondary | ICD-10-CM | POA: Diagnosis not present

## 2016-04-30 DIAGNOSIS — E039 Hypothyroidism, unspecified: Secondary | ICD-10-CM | POA: Diagnosis not present

## 2016-04-30 DIAGNOSIS — W19XXXD Unspecified fall, subsequent encounter: Secondary | ICD-10-CM | POA: Diagnosis not present

## 2016-05-02 DIAGNOSIS — W19XXXD Unspecified fall, subsequent encounter: Secondary | ICD-10-CM | POA: Diagnosis not present

## 2016-05-02 DIAGNOSIS — I10 Essential (primary) hypertension: Secondary | ICD-10-CM | POA: Diagnosis not present

## 2016-05-02 DIAGNOSIS — S72142D Displaced intertrochanteric fracture of left femur, subsequent encounter for closed fracture with routine healing: Secondary | ICD-10-CM | POA: Diagnosis not present

## 2016-05-02 DIAGNOSIS — K219 Gastro-esophageal reflux disease without esophagitis: Secondary | ICD-10-CM | POA: Diagnosis not present

## 2016-05-02 DIAGNOSIS — E039 Hypothyroidism, unspecified: Secondary | ICD-10-CM | POA: Diagnosis not present

## 2016-05-02 DIAGNOSIS — E119 Type 2 diabetes mellitus without complications: Secondary | ICD-10-CM | POA: Diagnosis not present

## 2016-05-06 ENCOUNTER — Ambulatory Visit (INDEPENDENT_AMBULATORY_CARE_PROVIDER_SITE_OTHER): Payer: Medicare Other | Admitting: Ophthalmology

## 2016-05-06 DIAGNOSIS — I10 Essential (primary) hypertension: Secondary | ICD-10-CM

## 2016-05-06 DIAGNOSIS — E11319 Type 2 diabetes mellitus with unspecified diabetic retinopathy without macular edema: Secondary | ICD-10-CM | POA: Diagnosis not present

## 2016-05-06 DIAGNOSIS — E113393 Type 2 diabetes mellitus with moderate nonproliferative diabetic retinopathy without macular edema, bilateral: Secondary | ICD-10-CM

## 2016-05-06 DIAGNOSIS — H35033 Hypertensive retinopathy, bilateral: Secondary | ICD-10-CM

## 2016-05-06 DIAGNOSIS — H43813 Vitreous degeneration, bilateral: Secondary | ICD-10-CM | POA: Diagnosis not present

## 2016-05-07 ENCOUNTER — Other Ambulatory Visit: Payer: Self-pay

## 2016-05-07 ENCOUNTER — Ambulatory Visit (INDEPENDENT_AMBULATORY_CARE_PROVIDER_SITE_OTHER): Payer: Medicare Other | Admitting: Endocrinology

## 2016-05-07 ENCOUNTER — Telehealth: Payer: Self-pay | Admitting: Endocrinology

## 2016-05-07 ENCOUNTER — Encounter: Payer: Self-pay | Admitting: Endocrinology

## 2016-05-07 VITALS — BP 120/62 | HR 71 | Ht 59.0 in | Wt 113.0 lb

## 2016-05-07 DIAGNOSIS — S72142D Displaced intertrochanteric fracture of left femur, subsequent encounter for closed fracture with routine healing: Secondary | ICD-10-CM | POA: Diagnosis not present

## 2016-05-07 DIAGNOSIS — E063 Autoimmune thyroiditis: Secondary | ICD-10-CM

## 2016-05-07 DIAGNOSIS — E039 Hypothyroidism, unspecified: Secondary | ICD-10-CM | POA: Diagnosis not present

## 2016-05-07 DIAGNOSIS — W19XXXD Unspecified fall, subsequent encounter: Secondary | ICD-10-CM | POA: Diagnosis not present

## 2016-05-07 DIAGNOSIS — E119 Type 2 diabetes mellitus without complications: Secondary | ICD-10-CM | POA: Diagnosis not present

## 2016-05-07 DIAGNOSIS — E1165 Type 2 diabetes mellitus with hyperglycemia: Secondary | ICD-10-CM | POA: Diagnosis not present

## 2016-05-07 DIAGNOSIS — E038 Other specified hypothyroidism: Secondary | ICD-10-CM

## 2016-05-07 DIAGNOSIS — K219 Gastro-esophageal reflux disease without esophagitis: Secondary | ICD-10-CM | POA: Diagnosis not present

## 2016-05-07 DIAGNOSIS — I10 Essential (primary) hypertension: Secondary | ICD-10-CM | POA: Diagnosis not present

## 2016-05-07 MED ORDER — GLYSET 50 MG PO TABS: 50.0000 mg | ORAL_TABLET | Freq: Two times a day (BID) | ORAL | 0 refills | Status: DC

## 2016-05-07 MED ORDER — SITAGLIPTIN PHOS-METFORMIN HCL 50-500 MG PO TABS: 1.0000 | ORAL_TABLET | Freq: Two times a day (BID) | ORAL | 0 refills | Status: DC

## 2016-05-07 NOTE — Patient Instructions (Signed)
Add 1 More Metformin at dinner  Check blood sugars on waking up  2x weekly  Also check blood sugars about 2 hours after a meal and do this after different meals by rotation  Recommended blood sugar levels on waking up is 90-130 and about 2 hours after meal is 130-160  Please bring your blood sugar monitor to each visit, thank you

## 2016-05-07 NOTE — Telephone Encounter (Signed)
Refill of   sitaGLIPtin-metformin (JANUMET) 50-500 MG tablet  Send to   [place], [place] - [address] [phone] (Phone) [phone] (Fax)

## 2016-05-07 NOTE — Telephone Encounter (Signed)
Ordered 05/07/16 with the change of pharmacy

## 2016-05-07 NOTE — Progress Notes (Signed)
Patient ID: Abigail Hoover, female   DOB: Sep 09, 1945, 71 y.o.   MRN: [id number]    Reason for Appointment:  follow-up of various issues  History of Present Illness   Diagnosis: Type 2 DIABETES MELITUS, long-standing  She has had mild diabetes for several years but has required multiple drugs for control Usually has had postprandial hyperglycemia, this has been better controlled with adding Glyset to her main meals Has never been on insulin or a GLP-1 drug Actos had been stopped previously because of tendency to edema  Recent history:  Oral hypoglycemic drugs:   Glyset 50 mg before breakfast, lunch and dinner, Janumet 50/ 500 twice a day   Her A1c is increased to 6.9, previously 5.8  Current management, problems identified and blood sugar patterns:  She is on a low dose of metformin, 500 mg twice a day because of previous tendency to low sugars with higher doses  She is checking her blood sugars mostly after supper at night and the date and time is incorrect.  She thinks her blood sugars have been higher at night but he added another tablet of Glyset with her tea time in the afternoon but not at dinner  Not clear if her blood sugars are higher fasting or during the day  Her weight has stayed stable  She does not do any exercise  She tries to eat small portions but not always getting protein at meals   Side effects from medications: None Proper timing of medications in relation to meals: Yes.          Monitors blood glucose: 1 times a day or less .    Glucometer: One Touch       Blood Glucose readings from download show recent readings after supper ranging from 115 up to 189, also has one reading of 299  Meals: 2- 3 meals per day.  eating small breakfast and lunch but very little protein, is vegetarian   Physical activity:  minimal            Dietician visit: Most recent: Unknown       Complications: are mild symptoms of neuropathy, no nephropathy or  retinopathy   Wt Readings from Last 3 Encounters:  05/07/16 113 lb (51.3 kg)  03/08/16 113 lb (51.3 kg)  03/07/16 113 lb (51.3 kg)   Lab Results  Component Value Date   HGBA1C 6.9 (H) 04/18/2016   HGBA1C 5.8 01/19/2016   HGBA1C 6.6 (H) 07/13/2015   Lab Results  Component Value Date   MICROALBUR 1.0 04/18/2016   LDLCALC 75 04/19/2016   CREATININE 1.34 (H) 04/18/2016        OTHER problems are in review of systems:    Allergies as of 05/07/2016      Reactions   Atorvastatin Rash   Ezetimibe Itching      Medication List       Accurate as of 05/07/16 11:59 PM. Always use your most recent med list.          aspirin 81 MG EC tablet Take 1 tablet (81 mg total) by mouth 2 (two) times daily with a meal. For 6 weeks   calcium citrate 950 MG tablet Commonly known as:  CALCITRATE - dosed in mg elemental calcium Take 200 mg of elemental calcium by mouth daily. 1/2 tab   furosemide 20 MG tablet Commonly known as:  LASIX   gabapentin 300 MG capsule Commonly known as:  NEURONTIN Take 300 mg by mouth 4 (  four) times daily.   GLYSET 50 MG tablet Generic drug:  miglitol Take 1 tablet (50 mg total) by mouth 2 (two) times daily. Must Make Office Visit!!!   HYDROcodone-acetaminophen 5-325 MG tablet Commonly known as:  NORCO/VICODIN Take 1-2 tablets by mouth every 6 (six) hours as needed for moderate pain.   ibandronate 150 MG tablet Commonly known as:  BONIVA   iron polysaccharides 150 MG capsule Commonly known as:  NIFEREX Take 1 capsule (150 mg total) by mouth daily.   isosorbide mononitrate 60 MG 24 hr tablet Commonly known as:  IMDUR Take 1 tablet (60 mg total) by mouth daily.   levothyroxine 75 MCG tablet Commonly known as:  SYNTHROID Take 1 tablet (75 mcg total) by mouth daily.   metoprolol succinate 50 MG 24 hr tablet Commonly known as:  TOPROL-XL Take 1 tablet (50 mg total) by mouth daily. Take with or immediately following a meal.   MYRBETRIQ 50 MG  Tb24 tablet Generic drug:  mirabegron ER Take 50 mg by mouth daily.   nitroGLYCERIN 0.4 MG SL tablet Commonly known as:  NITROSTAT Place 1 tablet (0.4 mg total) under the tongue every 5 (five) minutes x 3 doses as needed for chest pain.   ONE TOUCH ULTRA 2 w/Device Kit Use to check blood sugars 1 time per day   pentosan polysulfate 100 MG capsule Commonly known as:  ELMIRON Take 100 mg by mouth 3 (three) times daily before meals.   pravastatin 20 MG tablet Commonly known as:  PRAVACHOL TAKE 1 TABLET (20 MG TOTAL) BY MOUTH DAILY.   sitaGLIPtin-metformin 50-500 MG tablet Commonly known as:  JANUMET Take 1 tablet by mouth 2 (two) times daily with a meal.       Allergies:  Allergies  Allergen Reactions  . Atorvastatin Rash  . Ezetimibe Itching    Past Medical History:  Diagnosis Date  . 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 Anxiety   . Arthritis   . Bladder irritation   . Diabetes mellitus   . GERD (gastroesophageal reflux disease)   . Hypercholesterolemia   . Hypertension   . Hypothyroidism   . Iron deficiency anemia, unspecified   . Psoriasis   . Transfusion history 03/05/2016   for postoperative (ORIF) anemia superimposed on chronic anemia    Past Surgical History:  Procedure Laterality Date  . ABDOMINAL HYSTERECTOMY     TAH  . BREAST BIOPSY     Left breast biopsy benign lesion  . BREAST BIOPSY  03/11/2012   Procedure: BREAST BIOPSY;  Surgeon: Odis Hollingshead, MD;  Location: [place];  Service: General;  Laterality: Left;  remove left breast mass  . CARDIAC CATHETERIZATION N/A 01/02/2015   Procedure: Left Heart Cath and Coronary Angiography;  Surgeon: Charolette Forward, MD;  Location: [place] [facility];  Service: Cardiovascular;  Laterality: N/A;  . CATARACT EXTRACTION    . CERVICAL DISC SURGERY    . COLONOSCOPY  2013   neg. next 2023.   Marland Kitchen EYE SURGERY    . TOTAL HIP ARTHROPLASTY Left 03/02/2016   Procedure: TOTAL HIP ARTHROPLASTY ANTERIOR APPROACH;   Surgeon: Rod Can, MD;  Location: [place] [facility];  Service: Orthopedics;  Laterality: Left;    Family History  Problem Relation Age of Onset  . Heart disease Mother     heart attack  . Stroke Father     brain hemorrhage  . Diabetes Brother   . Cancer Brother   . Breast cancer Sister   .  Cancer Sister     breast cancer    Social History:  reports that she has never smoked. She has never used smokeless tobacco. She reports that she does not drink alcohol or use drugs.  Review of Systems:  HYPERTENSION:  this is well-controlled with Only metoprolol , now taking ER 50 mg daily Not clear if she had a high potassium and taking losartan previously  RENAL dysfunction and history of hypercalcemia:   Lab Results  Component Value Date   CREATININE 1.34 (H) 04/18/2016   BUN 24 (H) 04/18/2016   NA 135 04/18/2016   K 4.7 04/18/2016   CL 99 04/18/2016   CO2 30 04/18/2016     HYPERLIPIDEMIA: The lipid abnormality consists of elevated LDL which is mild and well controlled with pravastatin She thinks she had a rash from Lipitor    Lab Results  Component Value Date   CHOL 171 04/19/2016   HDL 72.90 04/19/2016   LDLCALC 75 04/19/2016   TRIG 115.0 04/19/2016   CHOLHDL 2 04/19/2016    She has a long history of primary HYPOTHYROIDISM which has been Previously treated with 88 g levothyroxine  She takes this on an empty stomach early in the morning And takes this regularly  TSH has been usually near normal, now back to normal with reducing the dose to 75 g   Lab Results  Component Value Date   TSH 2.97 04/18/2016   TSH 0.32 (L) 01/19/2016   TSH 0.89 07/13/2015   FREET4 1.07 04/18/2016   FREET4 1.37 07/13/2015   FREET4 1.69 (H) 12/07/2014    NEUROPATHY:  No symptoms of numbness In the feet, is on gabapentin for history of paresthesia   Diabetic foot exam in 4/17: Normal monofilament sensation but absent pulses   History of iron deficiency anemia; followed by  hematologist   Lab Results  Component Value Date   WBC 7.0 04/23/2016   HGB 11.3 (L) 04/23/2016   HCT 36.8 04/23/2016   MCV 82.0 04/23/2016   PLT 201 04/23/2016     OSTEOPENIA: Her T score Previously done by gynecologist was -2.1, currently on Boniva started in 01/2014     Examination:   BP 120/62   Pulse 71   Ht 4' 11" (1.499 m)   Wt 113 lb (51.3 kg)   SpO2 98%   BMI 22.82 kg/m   Body mass index is 22.82 kg/m.       ASSESSMENT/ PLAN:   Diabetes type 2, Nonobese  See history of present illness for detailed discussion of current diabetes management, blood sugar patterns and problems identified  Her A1c is relatively higher than before but not clear when she is having hyperglycemia she is taking only after supper and other readings are not being monitored and also her monitor has incorrect date and time For now since she is only on 500 mg twice a day of metformin will increase this by 500 mg with additional metformin tablet separately She will call back to let us know which pharmacy she wants  Reminded her to check blood sugars by rotation at different times  HYPERLIPIDEMIA: Well controlled with pravastatin  HYPOTHYROIDISM: TSH is better with reducing the dose to 75 g   Patient Instructions  Add 1 More Metformin at dinner  Check blood sugars on waking up  2x weekly  Also check blood sugars about 2 hours after a meal and do this after different meals by rotation  Recommended blood sugar levels on waking up is  90-130 and about 2 hours after meal is 130-160  Please bring your blood sugar monitor to each visit, thank you     [facility] 05/08/2016, 1:27 PM

## 2016-05-07 NOTE — Telephone Encounter (Signed)
[facility] is what she is going to be using now [phone]  janumet and glyset needs to be called in

## 2016-05-08 ENCOUNTER — Other Ambulatory Visit: Payer: Self-pay

## 2016-05-08 MED ORDER — SITAGLIPTIN PHOS-METFORMIN HCL 50-500 MG PO TABS: 1.0000 | ORAL_TABLET | Freq: Two times a day (BID) | ORAL | 0 refills | Status: DC

## 2016-05-08 NOTE — Telephone Encounter (Signed)
Ordered

## 2016-05-08 NOTE — Telephone Encounter (Signed)
Patient is calling on the status of last message please send to pharmacy patient ask.

## 2016-05-09 ENCOUNTER — Telehealth: Payer: Self-pay | Admitting: Endocrinology

## 2016-05-09 NOTE — Telephone Encounter (Signed)
Patient ask you to give her a call °

## 2016-05-10 ENCOUNTER — Other Ambulatory Visit: Payer: Self-pay

## 2016-05-10 DIAGNOSIS — I10 Essential (primary) hypertension: Secondary | ICD-10-CM | POA: Diagnosis not present

## 2016-05-10 DIAGNOSIS — S72142D Displaced intertrochanteric fracture of left femur, subsequent encounter for closed fracture with routine healing: Secondary | ICD-10-CM | POA: Diagnosis not present

## 2016-05-10 DIAGNOSIS — E119 Type 2 diabetes mellitus without complications: Secondary | ICD-10-CM | POA: Diagnosis not present

## 2016-05-10 DIAGNOSIS — K219 Gastro-esophageal reflux disease without esophagitis: Secondary | ICD-10-CM | POA: Diagnosis not present

## 2016-05-10 DIAGNOSIS — W19XXXD Unspecified fall, subsequent encounter: Secondary | ICD-10-CM | POA: Diagnosis not present

## 2016-05-10 DIAGNOSIS — E039 Hypothyroidism, unspecified: Secondary | ICD-10-CM | POA: Diagnosis not present

## 2016-05-10 MED ORDER — METFORMIN HCL 500 MG PO TABS: ORAL_TABLET | ORAL | 3 refills | Status: DC

## 2016-05-10 NOTE — Telephone Encounter (Signed)
Ordered 05/10/16

## 2016-05-10 NOTE — Telephone Encounter (Signed)
She will take 500 mg metformin in addition to her previous dose of Janumet

## 2016-05-13 ENCOUNTER — Ambulatory Visit (INDEPENDENT_AMBULATORY_CARE_PROVIDER_SITE_OTHER): Payer: Medicare Other | Admitting: Ophthalmology

## 2016-05-13 ENCOUNTER — Other Ambulatory Visit: Payer: Self-pay

## 2016-05-13 MED ORDER — GLUCOSE BLOOD VI STRP: ORAL_STRIP | 4 refills | Status: DC

## 2016-05-13 MED ORDER — ONETOUCH ULTRASOFT LANCETS MISC: 5 refills | Status: DC

## 2016-05-14 DIAGNOSIS — R05 Cough: Secondary | ICD-10-CM | POA: Diagnosis not present

## 2016-05-14 DIAGNOSIS — E871 Hypo-osmolality and hyponatremia: Secondary | ICD-10-CM | POA: Diagnosis not present

## 2016-05-16 ENCOUNTER — Other Ambulatory Visit: Payer: Self-pay

## 2016-05-16 MED ORDER — GLUCOSE BLOOD VI STRP: ORAL_STRIP | 3 refills | Status: DC

## 2016-05-16 NOTE — Telephone Encounter (Signed)
Pt needs [place] to call in the one touch meter supplies to [facility] on [address]

## 2016-05-17 ENCOUNTER — Other Ambulatory Visit: Payer: Self-pay

## 2016-05-17 ENCOUNTER — Telehealth: Payer: Self-pay | Admitting: Hematology and Oncology

## 2016-05-17 MED ORDER — ACCU-CHEK AVIVA DEVI: 1 refills | Status: DC

## 2016-05-17 MED ORDER — BAYER CONTOUR NEXT MONITOR W/DEVICE KIT: PACK | 1 refills | Status: DC

## 2016-05-17 MED ORDER — GLUCOSE BLOOD VI STRP: ORAL_STRIP | 3 refills | Status: DC

## 2016-05-17 MED ORDER — GLUCOSE BLOOD VI STRP: ORAL_STRIP | 5 refills | Status: DC

## 2016-05-17 MED ORDER — ACCU-CHEK SOFTCLIX LANCET DEV MISC: 2 refills | Status: DC

## 2016-05-17 NOTE — Telephone Encounter (Signed)
Pt states the insurance is not going cover the accucheck guide Spoke with Caryl Pina at [employer] she is suggesting we try to call in a new set meter, strips, lancets for the accucheck aviva  So it will be the accucheck aviva test strips and meter and the accucheck softclix lancets  Thank you

## 2016-05-17 NOTE — Telephone Encounter (Signed)
Patient called and need to reschedule 2/27 appointment due to another appointment on the same day

## 2016-05-17 NOTE — Telephone Encounter (Signed)
Done

## 2016-05-20 ENCOUNTER — Telehealth: Payer: Self-pay | Admitting: Hematology and Oncology

## 2016-05-20 NOTE — Telephone Encounter (Signed)
sw pt to r/s lab/inj appt to 3/5 per telephone encounter

## 2016-05-21 ENCOUNTER — Encounter: Payer: Self-pay | Admitting: Podiatry

## 2016-05-21 ENCOUNTER — Ambulatory Visit: Payer: Medicare Other

## 2016-05-21 ENCOUNTER — Ambulatory Visit (INDEPENDENT_AMBULATORY_CARE_PROVIDER_SITE_OTHER): Payer: Medicare Other | Admitting: Podiatry

## 2016-05-21 ENCOUNTER — Other Ambulatory Visit: Payer: Medicare Other

## 2016-05-21 VITALS — BP 136/59 | HR 68

## 2016-05-21 DIAGNOSIS — M79676 Pain in unspecified toe(s): Secondary | ICD-10-CM | POA: Diagnosis not present

## 2016-05-21 DIAGNOSIS — B351 Tinea unguium: Secondary | ICD-10-CM

## 2016-05-21 DIAGNOSIS — E1149 Type 2 diabetes mellitus with other diabetic neurological complication: Secondary | ICD-10-CM

## 2016-05-21 NOTE — Progress Notes (Signed)
   Subjective:    Patient ID: Abigail Hoover, female    DOB: 04-Apr-1945, 71 y.o.   MRN: [id number]  HPI this patient presents the office with chief complaint of long thick painful nails. He states nails are painful walking and wearing her shoes. She also relates that she is diabetic. She is taking gabapentin for her neuropathy. He presents the office today for an evaluation of her feet as well as nail debridement    Review of Systems  All other systems reviewed and are negative.      Objective:   Physical Exam GENERAL APPEARANCE: Alert, conversant. Appropriately groomed. No acute distress.  VASCULAR: Pedal pulses are  palpable at  [facility] and PT bilateral.  Capillary refill time is immediate to all digits,  Normal temperature gradient.   NEUROLOGIC: sensation is normal to 5.07 monofilament at 5/5 sites bilateral.  Light touch is intact bilateral, Muscle strength normal.  MUSCULOSKELETAL: acceptable muscle strength, tone and stability bilateral.  Intrinsic muscluature intact bilateral.  Rectus appearance of foot and digits noted bilateral.   DERMATOLOGIC: skin color, texture, and turgor are within normal limits.  No preulcerative lesions or ulcers  are seen, no interdigital maceration noted.  No open lesions present.. No drainage noted.  NAILS  thick disfigured discolored hallux toenails, both feet         Assessment & Plan:  Onychomycosis  Diabetes with no complications   IE  Debride nails  RTC 4 months   Gardiner Barefoot DPM

## 2016-05-21 NOTE — Progress Notes (Signed)
   Subjective:    Patient ID: Abigail Hoover, female    DOB: September 04, 1945, 71 y.o.   MRN: [id number]  HPI    Review of Systems     Objective:   Physical Exam        Assessment & Plan:

## 2016-05-22 ENCOUNTER — Other Ambulatory Visit: Payer: Self-pay

## 2016-05-22 ENCOUNTER — Ambulatory Visit: Payer: Medicare Other

## 2016-05-22 ENCOUNTER — Other Ambulatory Visit: Payer: Medicare Other

## 2016-05-22 MED ORDER — ONETOUCH ULTRASOFT LANCETS MISC: 4 refills | Status: DC

## 2016-05-22 MED ORDER — GLUCOSE BLOOD VI STRP: ORAL_STRIP | 4 refills | Status: DC

## 2016-05-22 NOTE — Telephone Encounter (Signed)
Please send one touch test strips to [facility] on [address] asap thank you

## 2016-05-22 NOTE — Telephone Encounter (Signed)
This is to be the one touch ultra 2

## 2016-05-23 ENCOUNTER — Ambulatory Visit (INDEPENDENT_AMBULATORY_CARE_PROVIDER_SITE_OTHER): Payer: Medicare Other | Admitting: Neurology

## 2016-05-23 ENCOUNTER — Encounter: Payer: Self-pay | Admitting: Neurology

## 2016-05-23 VITALS — BP 151/65 | HR 74 | Ht 59.0 in | Wt 112.8 lb

## 2016-05-23 DIAGNOSIS — G5603 Carpal tunnel syndrome, bilateral upper limbs: Secondary | ICD-10-CM | POA: Diagnosis not present

## 2016-05-23 MED ORDER — GLUCOSE BLOOD VI STRP: ORAL_STRIP | 12 refills | Status: DC

## 2016-05-23 NOTE — Patient Instructions (Signed)
I had a long discussion with the patient and her husband regarding her bilateral carpal tunnel syndrome and strongly encouraged her to wear wrist extension splints at all times. She continues to refuse to consider surgery for carpal tunnel since she likes to work a lot with her hands and finds wearing splints uncomfortable.  Greater than 50% time during this 25 minute visit was spent on counseling and coordination of care about.carpal tunnel syndrome treatment. Return for follow-up in 1 year or call earlier if necessary.   Carpal Tunnel Syndrome Carpal tunnel syndrome is a condition that causes pain in your hand and arm. The carpal tunnel is a narrow area located on the palm side of your wrist. Repeated wrist motion or certain diseases may cause swelling within the tunnel. This swelling pinches the main nerve in the wrist (median nerve). What are the causes? This condition may be caused by:  Repeated wrist motions.  Wrist injuries.  Arthritis.  A cyst or tumor in the carpal tunnel.  Fluid buildup during pregnancy. Sometimes the cause of this condition is not known. What increases the risk? This condition is more likely to develop in:  People who have jobs that cause them to repeatedly move their wrists in the same motion, such as Art gallery manager.  Women.  People with certain conditions, such as:  Diabetes.  Obesity.  An underactive thyroid (hypothyroidism).  Kidney failure. What are the signs or symptoms? Symptoms of this condition include:  A tingling feeling in your fingers, especially in your thumb, index, and middle fingers.  Tingling or numbness in your hand.  An aching feeling in your entire arm, especially when your wrist and elbow are bent for long periods of time.  Wrist pain that goes up your arm to your shoulder.  Pain that goes down into your palm or fingers.  A weak feeling in your hands. You may have trouble grabbing and holding items. Your symptoms  may feel worse during the night. How is this diagnosed? This condition is diagnosed with a medical history and physical exam. You may also have tests, including:  An electromyogram (EMG). This test measures electrical signals sent by your nerves into the muscles.  X-rays. How is this treated? Treatment for this condition includes:  Lifestyle changes. It is important to stop doing or modify the activity that caused your condition.  Physical or occupational therapy.  Medicines for pain and inflammation. This may include medicine that is injected into your wrist.  A wrist splint.  Surgery. Follow these instructions at home: If you have a splint:   Wear it as told by your health care provider. Remove it only as told by your health care provider.  Loosen the splint if your fingers become numb and tingle, or if they turn cold and blue.  Keep the splint clean and dry. General instructions   Take over-the-counter and prescription medicines only as told by your health care provider.  Rest your wrist from any activity that may be causing your pain. If your condition is work related, talk to your employer about changes that can be made, such as getting a wrist pad to use while typing.  If directed, apply ice to the painful area:  Put ice in a plastic bag.  Place a towel between your skin and the bag.  Leave the ice on for 20 minutes, 2-3 times per day.  Keep all follow-up visits as told by your health care provider. This is important.  Do any  exercises as told by your health care provider, physical therapist, or occupational therapist. Contact a health care provider if:  You have new symptoms.  Your pain is not controlled with medicines.  Your symptoms get worse. This information is not intended to replace advice given to you by your health care provider. Make sure you discuss any questions you have with your health care provider. Document Released: 03/08/2000 Document  Revised: 07/20/2015 Document Reviewed: 07/27/2014 Elsevier Interactive Patient Education  2017 [employer].

## 2016-05-23 NOTE — Progress Notes (Signed)
[facility] [address] [place]. [place] [address] [phone]       OFFICE FOLLOW UP VISIT NOTE  Abigail Hoover Date of Birth:  [birth year] Medical Record Number:  [id number]   Referring MD:  Mickel Duhamel  Reason for Referral:  Numbness hans and feet  HPI: 56 year [place] Hoover three-month history of tingling, numbness and burning in Abigail Hoover hands and feet. This involves mainly the bottom of Abigail Hoover feet and fingertips and hand it is present off and on throughout the day. At times Abigail Hoover fingers heart cramped. Abigail Hoover has some trouble with fine motor skills by cutting vegetables and occasionally drops objects. Abigail Hoover denies any significant problem with balance or walking. Abigail Hoover denies significant weakness in the legs. Abigail Hoover has tried Lyrica in the past which did not help and stopped it. Abigail Hoover is currently on gabapentin 300 mg three times daily which actually Abigail Hoover has been taking for 5 years. Abigail Hoover has history of diabetes but states his sugars are well controlled and lasti HbA1c 6.7 1 month ago. Abigail Hoover also complains of mild short-term memory difficulties which his hand for last several years. 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  Abigail Hoover has trouble remembering recent information   but at times but Abigail Hoover can remember later.. Abigail Hoover is still quite active and independent in activities of daily living and does not need any help. Abigail Hoover has history of degenerative cervical spine disease and underwent surgery by Dr. Sherwood Gambler. Abigail Hoover did have some paresthesias in Abigail Hoover hand prior to the surgery which have been persistent. Abigail Hoover denies significant neck pain, radicular pain or trouble with gait or balance . UPDATE 12/09/2013 : Abigail Hoover returns for followup of Abigail Hoover initial consultation 7 months ago. Abigail Hoover is tolerating increased does of bowel content but still has some paresthesias but these are not as bothersome. Abigail Hoover continues to complain of discomfort in Abigail Hoover hands and in Abigail Hoover fingers after using Abigail Hoover hands a lot. 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 Abigail Hoover did have conduction EMG study done on 04/25/13 by Dr. 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 Peripheral neuropathy panel labs were all normal. I had asked the Abigail Hoover to wear bilateral wrist extension splints but Abigail Hoover has not been doing so. Abigail Hoover states Abigail Hoover diabetes control has been quite good. Abigail Hoover has had no new neurological complaints. Cozaar has been discontinued by Abigail Hoover primary physician because of low blood pressure Update 03/31/2014 ; Abigail Hoover returns for follow-up after last visit 4 months ago. Abigail Hoover continues to have mild paresthesias in Abigail Hoover hand as well as burning in Abigail Hoover feet. Abigail Hoover has not been wearing wrist extension splints as instructed. Abigail Hoover is also not cut back on activities using Abigail Hoover hands for rapid repetitive flexion movements. Abigail Hoover continues to have mild gait difficulties but feels Abigail Hoover balance is okay and Abigail Hoover has not had any falls. Abigail Hoover is tolerating gabapentin 300 mg 4 times daily quite well without side effects. Abigail Hoover states Abigail Hoover diabetes control is quite good and last hemoglobin A1c was 6.0. Abigail Hoover has no new complaints today. Update 11/22/2015 : Abigail Hoover returns for follow-up after last visit 1.5 years ago. Abigail Hoover continues to have significant pain and paresthesias in Abigail Hoover hand from carpal tunnel. Abigail Hoover states that Abigail Hoover has not been wearing wrist extension splints regularly for long periods of time as it interferes with Abigail Hoover ability to use Abigail Hoover hands to work. Abigail Hoover also complains of cramps  and pain in Abigail Hoover feet particularly at night. Abigail Hoover is currently taking gabapentin 303 times daily which is tolerating well. The Abigail Hoover's husband recently was found to have carotid stenosis and Abigail Hoover is questioning whether Abigail Hoover needs a carotid Doppler ultrasound. Abigail Hoover states Abigail Hoover diabetes control is pretty good. Abigail Hoover appears reluctant to consider surgical options but is willing to talk about it Update  05/23/2016 : Abigail Hoover returns for follow-up after last visit 6 months ago. Abigail Hoover is accompanied by Abigail Hoover husband. Abigail Hoover continues to have pain and numbness in Abigail Hoover hands, carpal tunnel which appears to be unchanged. Abigail Hoover also continues not to wear wrist extension splints for long periods despite being counseled to do so. Abigail Hoover loves to work with Abigail Hoover hands. Abigail Hoover does take gabapentin 300 mg 3 times daily and is tolerating it well without side effects. Abigail Hoover states it does help Abigail Hoover. Abigail Hoover is has trouble sleeping at night sometimes the pain. The Abigail Hoover is refusing to undergo surgery or consider steroid injections into Abigail Hoover wrist. Blood pressure is usually well controlled and [place] [address]. Abigail Hoover states Abigail Hoover sugars are also well controlled. Abigail Hoover has no new complaints today. Abigail Hoover states Abigail Hoover mild memory difficulties also appear to be unchanged and are not bothersome ROS:   14 system review of systems is positive for , numbness,  and  Tingling,memory loss , and  all the systems negative  PMH:  Past Medical History:  Diagnosis Date  . 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 Anxiety   . Arthritis   . Bladder irritation   . Diabetes mellitus   . GERD (gastroesophageal reflux disease)   . Hypercholesterolemia   . Hypertension   . Hypothyroidism   . Iron deficiency anemia, unspecified   . Psoriasis   . Transfusion history 03/05/2016   for postoperative (ORIF) anemia superimposed on chronic anemia    Social History:  Social History   Social History  . Marital status: Married    Spouse name: N/A  . Number of children: 1  . Years of education: college   Occupational History  . Homemaker    Social History Main Topics  . Smoking status: Never Smoker  . Smokeless tobacco: Never Used  . Alcohol use No  . Drug use: No  . Sexual activity: Not Currently   Other Topics Concern  . Not on file   Social History Narrative   Abigail Hoover is married with one child.   Abigail Hoover is right handed.   Abigail Hoover  has college education.   Abigail Hoover drinks 1 cup daily.    Medications:   Current Outpatient Prescriptions on File Prior to Visit  Medication Sig Dispense Refill  . aspirin 81 MG EC tablet Take 1 tablet (81 mg total) by mouth 2 (two) times daily with a meal. For 6 weeks    . Blood Glucose Monitoring Suppl (BAYER CONTOUR NEXT MONITOR) w/Device KIT Use to test blood sugar daily Dx code E11.49 1 kit 1  . furosemide (LASIX) 20 MG tablet     . gabapentin (NEURONTIN) 300 MG capsule Take 300 mg by mouth 4 (four) times daily.    Marland Kitchen glucose blood (ONE TOUCH ULTRA TEST) test strip Use to check blood sugar 1 time per day. 100 each 12  . GLYSET 50 MG tablet Take 1 tablet (50 mg total) by mouth 2 (two) times daily. Must Make Office Visit!!! 180 tablet 0  . ibandronate (BONIVA) 150 MG tablet     . levothyroxine (SYNTHROID, LEVOTHROID)  75 MCG tablet Take 1 tablet (75 mcg total) by mouth daily. 90 tablet 1  . metFORMIN (GLUCOPHAGE) 500 MG tablet Take 1 tablet daily with a meal 30 tablet 3  . metoprolol succinate (TOPROL-XL) 50 MG 24 hr tablet Take 1 tablet (50 mg total) by mouth daily. Take with or immediately following a meal. 30 tablet 3  . MYRBETRIQ 50 MG TB24 tablet Take 50 mg by mouth daily.  3  . nitroGLYCERIN (NITROSTAT) 0.4 MG SL tablet Place 1 tablet (0.4 mg total) under the tongue every 5 (five) minutes x 3 doses as needed for chest pain. 25 tablet 12  . pentosan polysulfate (ELMIRON) 100 MG capsule Take 100 mg by mouth 3 (three) times daily before meals.     . pravastatin (PRAVACHOL) 20 MG tablet TAKE 1 TABLET (20 MG TOTAL) BY MOUTH DAILY. 90 tablet 0  . sitaGLIPtin-metformin (JANUMET) 50-500 MG tablet Take 1 tablet by mouth 2 (two) times daily with a meal. 180 tablet 0   No current facility-administered medications on file prior to visit.     Allergies:   Allergies  Allergen Reactions  . Atorvastatin Rash  . Ezetimibe Itching    Physical Exam General: frail elderly Abigail Hoover,  seated, in no evident distress Head: head normocephalic and atraumatic.   Neck: supple with no carotid or supraclavicular bruits Cardiovascular: regular rate and rhythm, no murmurs Musculoskeletal: no deformity. Tinel sign negative over both wrists Skin:  no rash/petichiae Vascular:  Normal pulses all extremities Vitals:   05/23/16 1151  BP: (!) 151/65  Pulse: 74    Neurologic Exam Mental Status: Awake and fully alert. Oriented to place and time. Recent and remote memory intact. Attention span, concentration and fund of knowledge appropriate. Mood and affect appropriate.  Cranial Nerves: Fundoscopic exam not done   Pupils equal, briskly reactive to light. Extraocular movements full without nystagmus. Visual fields full to confrontation. Hearing intact. Facial sensation intact. Face, tongue, palate moves normally and symmetrically.  Motor: Normal bulk and tone. Normal strength in all tested extremity muscles. Sensory.: intact to touch and pinprick sensation but impaired position and  vibratory. Sensation from ankle down bilaterally. Tinel sign negative over both wrists. Coordination: Rapid alternating movements normal in all extremities. Finger-to-nose and heel-to-shin performed accurately bilaterally. Gait and Station: Arises from chair without difficulty. Stance is normal. Gait demonstrates normal stride length and balance . Not able to heel, toe and tandem walk   Reflexes: 1+ and symmetric except ankle jerks absent and right supinator and triceps jerks brisk 2 + bilaterally. Toes downgoing.       ASSESSMENT: 53 year Abigail Hoover with  paresthesias in hands and feet likely due todiabetic peripheral neuropathy with contribution from bilateral carpal tunnel syndrome as well.   PLAN:  I had a long discussion with the Abigail Hoover and Abigail Hoover husband regarding Abigail Hoover bilateral carpal tunnel syndrome and strongly encouraged Abigail Hoover to wear wrist extension splints at all times. Abigail Hoover continues to  refuse to consider surgery for carpal tunnel since Abigail Hoover likes to work a lot with Abigail Hoover hands and finds wearing splints uncomfortable.  Greater than 50% time during this 25 minute visit was spent on counseling and coordination of care about.carpal tunnel syndrome treatment. Return for follow-up in 1 year or call earlier if necessary.Antony Contras, MD  [facility] [address] [place], [place] [address]  Phone [phone] Fax [phone] Note: This document was prepared with digital dictation and possible smart phrase technology.  Any transcriptional errors that result from this process are unintentional.

## 2016-05-23 NOTE — Telephone Encounter (Signed)
Rx for onetouch ultra test strips submitted.

## 2016-05-24 DIAGNOSIS — S42295D Other nondisplaced fracture of upper end of left humerus, subsequent encounter for fracture with routine healing: Secondary | ICD-10-CM | POA: Diagnosis not present

## 2016-05-27 ENCOUNTER — Other Ambulatory Visit (HOSPITAL_BASED_OUTPATIENT_CLINIC_OR_DEPARTMENT_OTHER): Payer: Medicare Other

## 2016-05-27 ENCOUNTER — Ambulatory Visit (HOSPITAL_BASED_OUTPATIENT_CLINIC_OR_DEPARTMENT_OTHER): Payer: Medicare Other

## 2016-05-27 VITALS — BP 144/82 | HR 62 | Temp 97.5°F | Resp 18

## 2016-05-27 DIAGNOSIS — N183 Chronic kidney disease, stage 3 unspecified: Secondary | ICD-10-CM

## 2016-05-27 DIAGNOSIS — D631 Anemia in chronic kidney disease: Secondary | ICD-10-CM

## 2016-05-27 DIAGNOSIS — N189 Chronic kidney disease, unspecified: Secondary | ICD-10-CM

## 2016-05-27 LAB — CBC WITH DIFFERENTIAL/PLATELET
BASO%: 0.4 % (ref 0.0–2.0)
Basophils Absolute: 0 10*3/uL (ref 0.0–0.1)
EOS%: 8.1 % — ABNORMAL HIGH (ref 0.0–7.0)
Eosinophils Absolute: 0.5 10*3/uL (ref 0.0–0.5)
HCT: 31.3 % — ABNORMAL LOW (ref 34.8–46.6)
HGB: 9.8 g/dL — ABNORMAL LOW (ref 11.6–15.9)
LYMPH%: 30.6 % (ref 14.0–49.7)
MCH: 25.7 pg (ref 25.1–34.0)
MCHC: 31.5 g/dL (ref 31.5–36.0)
MCV: 81.8 fL (ref 79.5–101.0)
MONO#: 0.4 10*3/uL (ref 0.1–0.9)
MONO%: 6.1 % (ref 0.0–14.0)
NEUT#: 3.2 10*3/uL (ref 1.5–6.5)
NEUT%: 54.8 % (ref 38.4–76.8)
Platelets: 206 10*3/uL (ref 145–400)
RBC: 3.83 10*6/uL (ref 3.70–5.45)
RDW: 16.7 % — ABNORMAL HIGH (ref 11.2–14.5)
WBC: 5.9 10*3/uL (ref 3.9–10.3)
lymph#: 1.8 10*3/uL (ref 0.9–3.3)

## 2016-05-27 MED ORDER — DARBEPOETIN ALFA 200 MCG/0.4ML IJ SOSY
200.0000 ug | PREFILLED_SYRINGE | Freq: Once | INTRAMUSCULAR | Status: AC
  Administered 2016-05-27: 200 ug via SUBCUTANEOUS
  Filled 2016-05-27: qty 0.4

## 2016-05-27 NOTE — Patient Instructions (Signed)
Darbepoetin Alfa injection What is this medicine? DARBEPOETIN ALFA (dar be POE e tin AL fa) helps your body make more red blood cells. It is used to treat anemia caused by chronic kidney failure and chemotherapy. This medicine may be used for other purposes; ask your health care provider or pharmacist if you have questions. What should I tell my health care provider before I take this medicine? They need to know if you have any of these conditions: -blood clotting disorders or history of blood clots -cancer patient not on chemotherapy -cystic fibrosis -heart disease, such as angina, heart failure, or a history of a heart attack -hemoglobin level of 12 g/dL or greater -high blood pressure -low levels of folate, iron, or vitamin B12 -seizures -an unusual or allergic reaction to darbepoetin, erythropoietin, albumin, hamster proteins, latex, other medicines, foods, dyes, or preservatives -pregnant or trying to get pregnant -breast-feeding How should I use this medicine? This medicine is for injection into a vein or under the skin. It is usually given by a health care professional in a hospital or clinic setting. If you get this medicine at home, you will be taught how to prepare and give this medicine. Do not shake the solution before you withdraw a dose. Use exactly as directed. Take your medicine at regular intervals. Do not take your medicine more often than directed. It is important that you put your used needles and syringes in a special sharps container. Do not put them in a trash can. If you do not have a sharps container, call your pharmacist or healthcare provider to get one. Talk to your pediatrician regarding the use of this medicine in children. While this medicine may be used in children as young as 1 year for selected conditions, precautions do apply. Overdosage: If you think you have taken too much of this medicine contact a poison control center or emergency room at once. NOTE:  This medicine is only for you. Do not share this medicine with others. What if I miss a dose? If you miss a dose, take it as soon as you can. If it is almost time for your next dose, take only that dose. Do not take double or extra doses. What may interact with this medicine? Do not take this medicine with any of the following medications: -epoetin alfa This list may not describe all possible interactions. Give your health care provider a list of all the medicines, herbs, non-prescription drugs, or dietary supplements you use. Also tell them if you smoke, drink alcohol, or use illegal drugs. Some items may interact with your medicine. What should I watch for while using this medicine? Visit your prescriber or health care professional for regular checks on your progress and for the needed blood tests and blood pressure measurements. It is especially important for the doctor to make sure your hemoglobin level is in the desired range, to limit the risk of potential side effects and to give you the best benefit. Keep all appointments for any recommended tests. Check your blood pressure as directed. Ask your doctor what your blood pressure should be and when you should contact him or her. As your body makes more red blood cells, you may need to take iron, folic acid, or vitamin B supplements. Ask your doctor or health care provider which products are right for you. If you have kidney disease continue dietary restrictions, even though this medication can make you feel better. Talk with your doctor or health care professional about the   foods you eat and the vitamins that you take. What side effects may I notice from receiving this medicine? Side effects that you should report to your doctor or health care professional as soon as possible: -allergic reactions like skin rash, itching or hives, swelling of the face, lips, or tongue -breathing problems -changes in vision -chest pain -confusion, trouble speaking  or understanding -feeling faint or lightheaded, falls -high blood pressure -muscle aches or pains -pain, swelling, warmth in the leg -rapid weight gain -severe headaches -sudden numbness or weakness of the face, arm or leg -trouble walking, dizziness, loss of balance or coordination -seizures (convulsions) -swelling of the ankles, feet, hands -unusually weak or tired Side effects that usually do not require medical attention (report to your doctor or health care professional if they continue or are bothersome): -diarrhea -fever, chills (flu-like symptoms) -headaches -nausea, vomiting -redness, stinging, or swelling at site where injected This list may not describe all possible side effects. Call your doctor for medical advice about side effects. You may report side effects to FDA at 1-800-FDA-1088. Where should I keep my medicine? Keep out of the reach of children. Store in a refrigerator between 2 and 8 degrees C (36 and 46 degrees F). Do not freeze. Do not shake. Throw away any unused portion if using a single-dose vial. Throw away any unused medicine after the expiration date. NOTE: This sheet is a summary. It may not cover all possible information. If you have questions about this medicine, talk to your doctor, pharmacist, or health care provider.    2016, Elsevier/Gold Standard. (2008-02-23 10:23:57)  

## 2016-06-03 ENCOUNTER — Encounter: Payer: Medicare Other | Attending: Endocrinology | Admitting: Nutrition

## 2016-06-16 NOTE — Progress Notes (Signed)
Pt, accompanied her husband who wanted to do a meter check with the lab value.  See husband's chart.  No incounter with her at this visit.

## 2016-06-17 ENCOUNTER — Telehealth: Payer: Self-pay | Admitting: *Deleted

## 2016-06-17 NOTE — Telephone Encounter (Signed)
What time am I to receive the injection tomorrow?"  Advised she has lab, MD and injection all beginning at 2:15 pm.  No further questions.

## 2016-06-18 ENCOUNTER — Telehealth: Payer: Self-pay | Admitting: Hematology and Oncology

## 2016-06-18 ENCOUNTER — Encounter: Payer: Self-pay | Admitting: Hematology and Oncology

## 2016-06-18 ENCOUNTER — Ambulatory Visit: Payer: Medicare Other

## 2016-06-18 ENCOUNTER — Other Ambulatory Visit (HOSPITAL_BASED_OUTPATIENT_CLINIC_OR_DEPARTMENT_OTHER): Payer: Medicare Other

## 2016-06-18 ENCOUNTER — Ambulatory Visit (HOSPITAL_BASED_OUTPATIENT_CLINIC_OR_DEPARTMENT_OTHER): Payer: Medicare Other | Admitting: Hematology and Oncology

## 2016-06-18 VITALS — BP 162/63 | HR 66 | Temp 98.4°F | Resp 18 | Ht 59.0 in | Wt 109.9 lb

## 2016-06-18 DIAGNOSIS — Z803 Family history of malignant neoplasm of breast: Secondary | ICD-10-CM

## 2016-06-18 DIAGNOSIS — N183 Chronic kidney disease, stage 3 unspecified: Secondary | ICD-10-CM

## 2016-06-18 DIAGNOSIS — D631 Anemia in chronic kidney disease: Secondary | ICD-10-CM

## 2016-06-18 DIAGNOSIS — I10 Essential (primary) hypertension: Secondary | ICD-10-CM

## 2016-06-18 DIAGNOSIS — D539 Nutritional anemia, unspecified: Secondary | ICD-10-CM

## 2016-06-18 LAB — CBC WITH DIFFERENTIAL/PLATELET
BASO%: 0.4 % (ref 0.0–2.0)
Basophils Absolute: 0 10*3/uL (ref 0.0–0.1)
EOS%: 8.3 % — ABNORMAL HIGH (ref 0.0–7.0)
Eosinophils Absolute: 0.4 10*3/uL (ref 0.0–0.5)
HCT: 38.4 % (ref 34.8–46.6)
HGB: 11.7 g/dL (ref 11.6–15.9)
LYMPH%: 31.2 % (ref 14.0–49.7)
MCH: 26.2 pg (ref 25.1–34.0)
MCHC: 30.5 g/dL — ABNORMAL LOW (ref 31.5–36.0)
MCV: 86.1 fL (ref 79.5–101.0)
MONO#: 0.3 10*3/uL (ref 0.1–0.9)
MONO%: 6.4 % (ref 0.0–14.0)
NEUT#: 2.7 10*3/uL (ref 1.5–6.5)
NEUT%: 53.7 % (ref 38.4–76.8)
Platelets: 207 10*3/uL (ref 145–400)
RBC: 4.46 10*6/uL (ref 3.70–5.45)
RDW: 16.2 % — ABNORMAL HIGH (ref 11.2–14.5)
WBC: 5 10*3/uL (ref 3.9–10.3)
lymph#: 1.6 10*3/uL (ref 0.9–3.3)

## 2016-06-18 NOTE — Assessment & Plan Note (Signed)
We discussed some of the risks, benefits, and alternatives of erythropoietin stimulating agents such as Procrit or Aranesp.  She tolerated injections well and her hemoglobin is normal. She would not receive her injection today. Moving forward, I will space out her appointment to once every 3 months The goal is to keep her hemoglobin greater than 11

## 2016-06-18 NOTE — Assessment & Plan Note (Signed)
The patient has family history of breast cancer. She had recurrent left breast surgery in the past. She had recent mammogram that is reported as negative. Examination today is benign. I reassured the patient

## 2016-06-18 NOTE — Telephone Encounter (Signed)
Gave patient avs report and appointments for June and September

## 2016-06-18 NOTE — Progress Notes (Signed)
[facility] OFFICE PROGRESS NOTE  Abigail Hams, MD SUMMARY OF HEMATOLOGIC HISTORY:  This is a patient that has been followed here since 2011 for chronic anemia In 2011, she had a bone marrow aspirate and biopsy that came back normal. The patient has been receiving Aranesp injection for chronic anemia up until 2014. It was subsequently discontinued due stability of the anemia. Starting 05/27/2014, darbepoetin was resumed at 200 g every other month to keep hemoglobin greater than 10 g. Treatment was discontinued in October 2016 and resumed in September 2017 INTERVAL HISTORY: Abigail Hoover 71 y.o. female returns for follow-up with her husband. She returns today for injection She feels well. The patient denies any recent signs or symptoms of bleeding such as spontaneous epistaxis, hematuria or hematochezia. The patient has concern about breast cancer She denies any recent abnormal breast examination, palpable mass, abnormal breast appearance or nipple changes Her recent mammogram from August 2017 was negative  I have reviewed the past medical history, past surgical history, social history and family history with the patient and they are unchanged from previous note.  ALLERGIES:  is allergic to atorvastatin and ezetimibe.  MEDICATIONS:  Current Outpatient Prescriptions  Medication Sig Dispense Refill  . aspirin 81 MG EC tablet Take 1 tablet (81 mg total) by mouth 2 (two) times daily with a meal. For 6 weeks    . Blood Glucose Monitoring Suppl (BAYER CONTOUR NEXT MONITOR) w/Device KIT Use to test blood sugar daily Dx code E11.49 1 kit 1  . DEXILANT 60 MG capsule     . furosemide (LASIX) 20 MG tablet     . gabapentin (NEURONTIN) 300 MG capsule Take 300 mg by mouth 4 (four) times daily.    Marland Kitchen glucose blood (ONE TOUCH ULTRA TEST) test strip Use to check blood sugar 1 time per day. 100 each 12  . GLYSET 50 MG tablet Take 1 tablet (50 mg total) by mouth 2 (two) times daily.  Must Make Office Visit!!! 180 tablet 0  . ibandronate (BONIVA) 150 MG tablet     . levothyroxine (SYNTHROID, LEVOTHROID) 75 MCG tablet Take 1 tablet (75 mcg total) by mouth daily. 90 tablet 1  . metFORMIN (GLUCOPHAGE) 500 MG tablet Take 1 tablet daily with a meal 30 tablet 3  . methocarbamol (ROBAXIN) 500 MG tablet     . metoprolol succinate (TOPROL-XL) 50 MG 24 hr tablet Take 1 tablet (50 mg total) by mouth daily. Take with or immediately following a meal. 30 tablet 3  . MYRBETRIQ 50 MG TB24 tablet Take 50 mg by mouth daily.  3  . nitroGLYCERIN (NITROSTAT) 0.4 MG SL tablet Place 1 tablet (0.4 mg total) under the tongue every 5 (five) minutes x 3 doses as needed for chest pain. 25 tablet 12  . pentosan polysulfate (ELMIRON) 100 MG capsule Take 100 mg by mouth 3 (three) times daily before meals.     . pravastatin (PRAVACHOL) 20 MG tablet TAKE 1 TABLET (20 MG TOTAL) BY MOUTH DAILY. 90 tablet 0  . sitaGLIPtin-metformin (JANUMET) 50-500 MG tablet Take 1 tablet by mouth 2 (two) times daily with a meal. 180 tablet 0  . TOPROL XL 25 MG 24 hr tablet      No current facility-administered medications for this visit.      REVIEW OF SYSTEMS:   Constitutional: Denies fevers, chills or night sweats Eyes: Denies blurriness of vision Ears, nose, mouth, throat, and face: Denies mucositis or sore throat Respiratory: Denies cough, dyspnea or  wheezes Cardiovascular: Denies palpitation, chest discomfort or lower extremity swelling Gastrointestinal:  Denies nausea, heartburn or change in bowel habits Skin: Denies abnormal skin rashes Lymphatics: Denies new lymphadenopathy or easy bruising Neurological:Denies numbness, tingling or new weaknesses Behavioral/Psych: Mood is stable, no new changes  All other systems were reviewed with the patient and are negative.  PHYSICAL EXAMINATION: ECOG PERFORMANCE STATUS: 0 - Asymptomatic  Vitals:   06/18/16 1503  BP: (!) 162/63  Pulse: 66  Resp: 18  Temp: 98.4 F  (36.9 C)   Filed Weights   06/18/16 1503  Weight: 109 lb 14.4 oz (49.9 kg)    GENERAL:alert, no distress and comfortable SKIN: skin color, texture, turgor are normal, no rashes or significant lesions EYES: normal, Conjunctiva are pink and non-injected, sclera clear OROPHARYNX:no exudate, no erythema and lips, buccal mucosa, and tongue normal  NECK: supple, thyroid normal size, non-tender, without nodularity LYMPH:  no palpable lymphadenopathy in the cervical, axillary or inguinal LUNGS: clear to auscultation and percussion with normal breathing effort HEART: regular rate & rhythm and no murmurs and no lower extremity edema ABDOMEN:abdomen soft, non-tender and normal bowel sounds Musculoskeletal:no cyanosis of digits and no clubbing  NEURO: alert & oriented x 3 with fluent speech, no focal motor/sensory deficits Bilateral breast examinations are performed.  Normal breast exam on the right.  On the left, well-healed lumpectomy scar with no other abnormalities LABORATORY DATA:  I have reviewed the data as listed     Component Value Date/Time   NA 135 04/18/2016 1439   NA 139 12/12/2015 1436   K 4.7 04/18/2016 1439   K 4.6 12/12/2015 1436   CL 99 04/18/2016 1439   CL 103 07/27/2012 1259   CO2 30 04/18/2016 1439   CO2 26 12/12/2015 1436   GLUCOSE 146 (H) 04/18/2016 1439   GLUCOSE 120 12/12/2015 1436   GLUCOSE 118 (H) 07/27/2012 1259   BUN 24 (H) 04/18/2016 1439   BUN 15.7 12/12/2015 1436   CREATININE 1.34 (H) 04/18/2016 1439   CREATININE 1.2 (H) 12/12/2015 1436   CALCIUM 10.4 04/18/2016 1439   CALCIUM 9.8 12/12/2015 1436   PROT 6.4 04/18/2016 1439   PROT 6.2 (L) 12/12/2015 1436   ALBUMIN 4.0 04/18/2016 1439   ALBUMIN 3.4 (L) 12/12/2015 1436   AST 17 04/18/2016 1439   AST 18 12/12/2015 1436   ALT 9 04/18/2016 1439   ALT 15 12/12/2015 1436   ALKPHOS 77 04/18/2016 1439   ALKPHOS 74 12/12/2015 1436   BILITOT 0.4 04/18/2016 1439   BILITOT <0.30 12/12/2015 1436   GFRNONAA  52 (L) 03/04/2016 0528   GFRAA >60 03/04/2016 0528    No results found for: SPEP, UPEP  Lab Results  Component Value Date   WBC 5.0 06/18/2016   NEUTROABS 2.7 06/18/2016   HGB 11.7 06/18/2016   HCT 38.4 06/18/2016   MCV 86.1 06/18/2016   PLT 207 06/18/2016      Chemistry      Component Value Date/Time   NA 135 04/18/2016 1439   NA 139 12/12/2015 1436   K 4.7 04/18/2016 1439   K 4.6 12/12/2015 1436   CL 99 04/18/2016 1439   CL 103 07/27/2012 1259   CO2 30 04/18/2016 1439   CO2 26 12/12/2015 1436   BUN 24 (H) 04/18/2016 1439   BUN 15.7 12/12/2015 1436   CREATININE 1.34 (H) 04/18/2016 1439   CREATININE 1.2 (H) 12/12/2015 1436      Component Value Date/Time   CALCIUM 10.4 04/18/2016 1439  CALCIUM 9.8 12/12/2015 1436   ALKPHOS 77 04/18/2016 1439   ALKPHOS 74 12/12/2015 1436   AST 17 04/18/2016 1439   AST 18 12/12/2015 1436   ALT 9 04/18/2016 1439   ALT 15 12/12/2015 1436   BILITOT 0.4 04/18/2016 1439   BILITOT <0.30 12/12/2015 1436      ASSESSMENT & PLAN:  Anemia in chronic kidney disease We discussed some of the risks, benefits, and alternatives of erythropoietin stimulating agents such as Procrit or Aranesp.  She tolerated injections well and her hemoglobin is normal. She would not receive her injection today. Moving forward, I will space out her appointment to once every 3 months The goal is to keep her hemoglobin greater than 11   Family history of breast cancer The patient has family history of breast cancer. She had recurrent left breast surgery in the past. She had recent mammogram that is reported as negative. Examination today is benign. I reassured the patient  Essential hypertension Her blood pressure is a little high today, which could be attributed by whitecoat hypertension she will continue current medical management. I recommend close follow-up with primary care doctor for medication adjustment.   Orders Placed This Encounter  Procedures   . Vitamin B12    Standing Status:   Future    Standing Expiration Date:   07/23/2017  . Iron and TIBC    Standing Status:   Future    Standing Expiration Date:   07/23/2017  . Ferritin    Standing Status:   Future    Standing Expiration Date:   07/23/2017  . Erythropoietin    Standing Status:   Future    Standing Expiration Date:   07/23/2017  . CBC with Differential/Platelet    Standing Status:   Future    Standing Expiration Date:   07/23/2017  . Comprehensive metabolic panel    Standing Status:   Future    Standing Expiration Date:   07/23/2017    All questions were answered. The patient knows to call the clinic with any problems, questions or concerns. No barriers to learning was detected.  I spent 15 minutes counseling the patient face to face. The total time spent in the appointment was 20 minutes and more than 50% was on counseling.     [name], MD 3/27/20184:34 PM

## 2016-06-18 NOTE — Assessment & Plan Note (Signed)
Her blood pressure is a little high today, which could be attributed by whitecoat hypertension she will continue current medical management. I recommend close follow-up with primary care doctor for medication adjustment.

## 2016-06-24 DIAGNOSIS — R21 Rash and other nonspecific skin eruption: Secondary | ICD-10-CM | POA: Diagnosis not present

## 2016-06-25 DIAGNOSIS — R3989 Other symptoms and signs involving the genitourinary system: Secondary | ICD-10-CM | POA: Diagnosis not present

## 2016-06-25 DIAGNOSIS — N3010 Interstitial cystitis (chronic) without hematuria: Secondary | ICD-10-CM | POA: Diagnosis not present

## 2016-06-27 ENCOUNTER — Telehealth: Payer: Self-pay | Admitting: Endocrinology

## 2016-06-27 DIAGNOSIS — N3010 Interstitial cystitis (chronic) without hematuria: Secondary | ICD-10-CM | POA: Diagnosis not present

## 2016-06-27 NOTE — Telephone Encounter (Signed)
Patient need a PA for medication GLYSET 50 MG tablet

## 2016-07-01 ENCOUNTER — Other Ambulatory Visit: Payer: Self-pay

## 2016-07-01 DIAGNOSIS — N3010 Interstitial cystitis (chronic) without hematuria: Secondary | ICD-10-CM | POA: Diagnosis not present

## 2016-07-01 MED ORDER — GLYSET 50 MG PO TABS: 50.0000 mg | ORAL_TABLET | Freq: Two times a day (BID) | ORAL | 0 refills | Status: DC

## 2016-07-01 NOTE — Telephone Encounter (Signed)
Pt's spouse called back to follow-up and stated that [employer] is stating they have to know that pt is still needing to take the [facility] and needs to continue on it.

## 2016-07-02 NOTE — Telephone Encounter (Signed)
May need PA or May switch to acarbose same doses, check with pharmacy

## 2016-07-02 NOTE — Telephone Encounter (Signed)
Spoke with the patients husband and he stated the pharmacy told him that  the Dr. has to request  this medication or she can not get it has to be the doctor that states it is ok for her to take this medication and states that she has been on it for many years- please advise

## 2016-07-02 NOTE — Telephone Encounter (Signed)
Yes she needs to continue taking this, not clear what the question is

## 2016-07-05 ENCOUNTER — Other Ambulatory Visit: Payer: Self-pay

## 2016-07-05 ENCOUNTER — Telehealth: Payer: Self-pay | Admitting: Endocrinology

## 2016-07-05 NOTE — Telephone Encounter (Signed)
PA for meds GLYSET 50 MG tablet

## 2016-07-05 NOTE — Telephone Encounter (Signed)
Spoke to the patients husband and let him know of the change in medication and that I called in the prescription already and he stated an understanding.

## 2016-07-09 DIAGNOSIS — S42295D Other nondisplaced fracture of upper end of left humerus, subsequent encounter for fracture with routine healing: Secondary | ICD-10-CM | POA: Diagnosis not present

## 2016-07-09 DIAGNOSIS — S72092D Other fracture of head and neck of left femur, subsequent encounter for closed fracture with routine healing: Secondary | ICD-10-CM | POA: Diagnosis not present

## 2016-07-12 DIAGNOSIS — N3010 Interstitial cystitis (chronic) without hematuria: Secondary | ICD-10-CM | POA: Diagnosis not present

## 2016-07-15 DIAGNOSIS — N3010 Interstitial cystitis (chronic) without hematuria: Secondary | ICD-10-CM | POA: Diagnosis not present

## 2016-07-18 DIAGNOSIS — N3010 Interstitial cystitis (chronic) without hematuria: Secondary | ICD-10-CM | POA: Diagnosis not present

## 2016-07-18 NOTE — Telephone Encounter (Signed)
Please have a schedule an appointment to see me for further discussion

## 2016-07-18 NOTE — Telephone Encounter (Signed)
Spoke with her husband and he is aware

## 2016-07-18 NOTE — Telephone Encounter (Signed)
Patient stating now that she is taking the generic medicine instead of glyset he blood sugar readings are high-  193- last night Please advise

## 2016-07-18 NOTE — Telephone Encounter (Signed)
Patient is stating she is eating the same things but sugar is going up because it is a generic medication that she is taking now last night her sugar was 193- please advise

## 2016-07-18 NOTE — Telephone Encounter (Signed)
Patient stated medication Glyset it works better for body the other medication acarbose wasn't helping her her b/s is high. Please advise

## 2016-07-18 NOTE — Telephone Encounter (Signed)
I cannot understand the patient's problem.  Please clarify.  If she is having a problem with the blood sugars I can see her sooner than scheduled

## 2016-07-22 ENCOUNTER — Ambulatory Visit: Payer: Medicare Other | Admitting: Endocrinology

## 2016-07-22 DIAGNOSIS — R300 Dysuria: Secondary | ICD-10-CM | POA: Diagnosis not present

## 2016-07-22 DIAGNOSIS — N3010 Interstitial cystitis (chronic) without hematuria: Secondary | ICD-10-CM | POA: Diagnosis not present

## 2016-07-26 ENCOUNTER — Encounter: Payer: Self-pay | Admitting: Endocrinology

## 2016-07-26 ENCOUNTER — Ambulatory Visit (INDEPENDENT_AMBULATORY_CARE_PROVIDER_SITE_OTHER): Payer: Medicare Other | Admitting: Endocrinology

## 2016-07-26 VITALS — BP 110/62 | HR 70 | Ht 59.0 in | Wt 114.4 lb

## 2016-07-26 DIAGNOSIS — E063 Autoimmune thyroiditis: Secondary | ICD-10-CM

## 2016-07-26 DIAGNOSIS — E1165 Type 2 diabetes mellitus with hyperglycemia: Secondary | ICD-10-CM | POA: Diagnosis not present

## 2016-07-26 LAB — TSH: TSH: 1.63 u[IU]/mL (ref 0.35–4.50)

## 2016-07-26 NOTE — Progress Notes (Signed)
Patient ID: Abigail Hoover, female   DOB: 10-30-1945, 71 y.o.   MRN: [id number]    Reason for Appointment:  follow-up of various issues  History of Present Illness   Diagnosis: Type 2 DIABETES MELITUS, long-standing  She has had mild diabetes for several years but has required multiple drugs for control Usually has had postprandial hyperglycemia, this has been better controlled with adding Glyset to her main meals Has never been on insulin or a GLP-1 drug Actos had been stopped previously because of tendency to edema  Recent history:  Oral hypoglycemic drugs:   Glyset 50 mg before breakfast, lunch and dinner, Janumet 50/ 500 twice a day   Her A1c is 6.9 as of 1/18, no recent labs available  Current management, problems identified and blood sugar patterns:  She came in today for follow-up because of her insurance making her change from [place] to [employer] about a month ago  She thinks her blood sugars are higher but has only a couple of readings over 180 after evening meal   she also now says that she may occasionally forget to take her Precose before eating and will take it after  Also has been given metformin 500 mg in addition to her low dose Janumet  She is checking her blood sugars mostly after supper at night and has occasional readings earlier in the day which are minimally increased  She takes Glyset mostly twice a day as she is not eating a lunch as such  Has a couple of fasting readings which were excellent  Her weight has gone up some  She does not do any exercise  She tries to eat small portions but not always getting protein at meals   Side effects from medications: None Proper timing of medications in relation to meals: Yes.          Monitors blood glucose: 1 times a day or less .    Glucometer: One Touch       Blood Glucose readings from download   Mean values apply above for all meters except median for One Touch  PRE-MEAL Fasting Lunch Dinner  Bedtime Overall  Glucose range: 85, 92  114-163  154     Mean/median:     149    POST-MEAL PC Breakfast PC Lunch PC Dinner  Glucose range:    80-1 93   Mean/median:   150     Meals: 2- 3 meals per day.  eating small breakfast and lunch but very little protein, is vegetarian   Physical activity:   little walking            Dietician visit: Most recent: Unknown       Complications: are mild symptoms of neuropathy, no nephropathy or retinopathy   Wt Readings from Last 3 Encounters:  07/26/16 114 lb 6.4 oz (51.9 kg)  06/18/16 109 lb 14.4 oz (49.9 kg)  05/23/16 112 lb 12.8 oz (51.2 kg)   Lab Results  Component Value Date   HGBA1C 6.9 (H) 04/18/2016   HGBA1C 5.8 01/19/2016   HGBA1C 6.6 (H) 07/13/2015   Lab Results  Component Value Date   MICROALBUR 1.0 04/18/2016   LDLCALC 75 04/19/2016   CREATININE 1.34 (H) 04/18/2016      Lab Results  Component Value Date   FRUCTOSAMINE 266 07/26/2016   FRUCTOSAMINE 230 12/07/2014     OTHER problems are in review of systems:    Allergies as of 07/26/2016  Reactions   Atorvastatin Rash   Ezetimibe Itching      Medication List       Accurate as of 07/26/16 11:59 PM. Always use your most recent med list.          acarbose 50 MG tablet Commonly known as:  PRECOSE Take 50 mg by mouth 2 (two) times daily.   aspirin 81 MG EC tablet Take 1 tablet (81 mg total) by mouth 2 (two) times daily with a meal. For 6 weeks   BAYER CONTOUR NEXT MONITOR w/Device Kit Use to test blood sugar daily Dx code E11.49   DEXILANT 60 MG capsule Generic drug:  dexlansoprazole   furosemide 20 MG tablet Commonly known as:  LASIX   gabapentin 300 MG capsule Commonly known as:  NEURONTIN Take 300 mg by mouth 4 (four) times daily.   glucose blood test strip Commonly known as:  ONE TOUCH ULTRA TEST Use to check blood sugar 1 time per day.   GLYSET 50 MG tablet Generic drug:  miglitol Take 1 tablet (50 mg total) by mouth 2 (two) times  daily. Must Make Office Visit!!!   ibandronate 150 MG tablet Commonly known as:  BONIVA   levothyroxine 75 MCG tablet Commonly known as:  SYNTHROID Take 1 tablet (75 mcg total) by mouth daily.   metFORMIN 500 MG tablet Commonly known as:  GLUCOPHAGE Take 1 tablet daily with a meal   methocarbamol 500 MG tablet Commonly known as:  ROBAXIN   metoprolol succinate 50 MG 24 hr tablet Commonly known as:  TOPROL-XL Take 1 tablet (50 mg total) by mouth daily. Take with or immediately following a meal.   TOPROL XL 25 MG 24 hr tablet Generic drug:  metoprolol succinate   MYRBETRIQ 50 MG Tb24 tablet Generic drug:  mirabegron ER Take 50 mg by mouth daily.   nitroGLYCERIN 0.4 MG SL tablet Commonly known as:  NITROSTAT Place 1 tablet (0.4 mg total) under the tongue every 5 (five) minutes x 3 doses as needed for chest pain.   pentosan polysulfate 100 MG capsule Commonly known as:  ELMIRON Take 100 mg by mouth 3 (three) times daily before meals.   pravastatin 20 MG tablet Commonly known as:  PRAVACHOL TAKE 1 TABLET (20 MG TOTAL) BY MOUTH DAILY.   sitaGLIPtin-metformin 50-500 MG tablet Commonly known as:  JANUMET Take 1 tablet by mouth 2 (two) times daily with a meal.       Allergies:  Allergies  Allergen Reactions  . Atorvastatin Rash  . Ezetimibe Itching    Past Medical History:  Diagnosis Date  . 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 Anxiety   . Arthritis   . Bladder irritation   . Diabetes mellitus   . GERD (gastroesophageal reflux disease)   . Hypercholesterolemia   . Hypertension   . Hypothyroidism   . Iron deficiency anemia, unspecified   . Psoriasis   . Transfusion history 03/05/2016   for postoperative (ORIF) anemia superimposed on chronic anemia    Past Surgical History:  Procedure Laterality Date  . ABDOMINAL HYSTERECTOMY     TAH  . BREAST BIOPSY     Left breast biopsy benign lesion  . BREAST BIOPSY  03/11/2012   Procedure: BREAST BIOPSY;   Surgeon: Odis Hollingshead, MD;  Location: [facility];  Service: General;  Laterality: Left;  remove left breast mass  . CARDIAC CATHETERIZATION N/A 01/02/2015   Procedure: Left Heart Cath and Coronary Angiography;  Surgeon: Charolette Forward,  MD;  Location: [facility];  Service: Cardiovascular;  Laterality: N/A;  . CATARACT EXTRACTION    . CERVICAL DISC SURGERY    . COLONOSCOPY  2013   neg. next 2023.   Marland Kitchen EYE SURGERY    . LEFT HIP SURGERY    . TOTAL HIP ARTHROPLASTY Left 03/02/2016   Procedure: TOTAL HIP ARTHROPLASTY ANTERIOR APPROACH;  Surgeon: Rod Can, MD;  Location: [facility];  Service: Orthopedics;  Laterality: Left;    Family History  Problem Relation Age of Onset  . Heart disease Mother     heart attack  . Stroke Father     brain hemorrhage  . Diabetes Brother   . Cancer Brother   . Breast cancer Sister   . Cancer Sister     breast cancer    Social History:  reports that she has never smoked. She has never used smokeless tobacco. She reports that she does not drink alcohol or use drugs.  Review of Systems:  HYPERTENSION: Mild, this is well-controlled with metoprolol , taking ER 50 mg daily  RENAL dysfunction is mild, etiology unclear   Lab Results  Component Value Date   CREATININE 1.34 (H) 04/18/2016   BUN 24 (H) 04/18/2016   NA 135 04/18/2016   K 4.7 04/18/2016   CL 99 04/18/2016   CO2 30 04/18/2016     HYPERLIPIDEMIA: The lipid abnormality consists of elevated LDL which is mild and well controlled with pravastatin 20 mg She thinks she had a rash from Lipitor    Lab Results  Component Value Date   CHOL 171 04/19/2016   HDL 72.90 04/19/2016   LDLCALC 75 04/19/2016   TRIG 115.0 04/19/2016   CHOLHDL 2 04/19/2016    She has a long history of primary HYPOTHYROIDISM which has been Previously treated with 88 g levothyroxine  She takes this on an empty stomach early in the morning Daily  TSH has been usually near normal, back to normal with reducing  the dose to 75 g   Lab Results  Component Value Date   TSH 1.63 07/26/2016   TSH 2.97 04/18/2016   TSH 0.32 (L) 01/19/2016   FREET4 1.07 04/18/2016   FREET4 1.37 07/13/2015   FREET4 1.69 (H) 12/07/2014    NEUROPATHY:  No symptoms of numbness In the feet, is on gabapentin for history of paresthesia   Diabetic foot exam in 4/17: Normal monofilament sensation but absent pulses   OSTEOPENIA: Her T score Previously done by gynecologist was -2.1, currently on Boniva started in 01/2014     Examination:   BP 110/62   Pulse 70   Ht '4\' 11"'$  (1.499 m)   Wt 114 lb 6.4 oz (51.9 kg)   SpO2 99%   BMI 23.11 kg/m   Body mass index is 23.11 kg/m.       ASSESSMENT/ PLAN:   Diabetes type 2, Nonobese  See history of present illness for discussion of current diabetes management, blood sugar patterns and problems identified  She has not been seen regularly and has not had an A1c drawn prior to her visit today She is complaining about high readings after supper but has only a couple of documented high readings recently with switching from [place] to [place] because of insurance preference Blood sugars are not high or other times of the day She does not always take her Precose before eating and emphasized the need to do so Also needs to start walking for exercise Emphasized the need for  adding a protein to each meal even snacks  Will check fructosamine today and A1c in about 2 months  HYPOTHYROIDISM: TSH will be rechecked today   There are no Patient Instructions on file for this visit.   Abigail Hoover 07/28/2016, 9:25 PM

## 2016-07-27 LAB — FRUCTOSAMINE: Fructosamine: 266 umol/L (ref 0–285)

## 2016-07-28 NOTE — Progress Notes (Signed)
Please call to let patient know that the lab results are in a good range and no further action needed, however needs to have follow-up appointment made in 2 months with labs, no appointment made currently

## 2016-07-29 ENCOUNTER — Other Ambulatory Visit: Payer: Self-pay | Admitting: Endocrinology

## 2016-07-30 ENCOUNTER — Other Ambulatory Visit: Payer: Self-pay | Admitting: Endocrinology

## 2016-08-01 DIAGNOSIS — N3010 Interstitial cystitis (chronic) without hematuria: Secondary | ICD-10-CM | POA: Diagnosis not present

## 2016-08-15 ENCOUNTER — Other Ambulatory Visit: Payer: Self-pay

## 2016-08-15 DIAGNOSIS — R3915 Urgency of urination: Secondary | ICD-10-CM | POA: Diagnosis not present

## 2016-08-15 DIAGNOSIS — R300 Dysuria: Secondary | ICD-10-CM | POA: Diagnosis not present

## 2016-08-15 DIAGNOSIS — R350 Frequency of micturition: Secondary | ICD-10-CM | POA: Diagnosis not present

## 2016-08-15 DIAGNOSIS — N3010 Interstitial cystitis (chronic) without hematuria: Secondary | ICD-10-CM | POA: Diagnosis not present

## 2016-08-15 DIAGNOSIS — R102 Pelvic and perineal pain: Secondary | ICD-10-CM | POA: Diagnosis not present

## 2016-08-15 MED ORDER — ONETOUCH ULTRASOFT LANCETS MISC: 12 refills | Status: DC

## 2016-08-15 MED ORDER — GLUCOSE BLOOD VI STRP: ORAL_STRIP | 12 refills | Status: DC

## 2016-08-20 DIAGNOSIS — N390 Urinary tract infection, site not specified: Secondary | ICD-10-CM | POA: Diagnosis not present

## 2016-08-20 DIAGNOSIS — S42295D Other nondisplaced fracture of upper end of left humerus, subsequent encounter for fracture with routine healing: Secondary | ICD-10-CM | POA: Diagnosis not present

## 2016-08-20 DIAGNOSIS — E1142 Type 2 diabetes mellitus with diabetic polyneuropathy: Secondary | ICD-10-CM | POA: Diagnosis not present

## 2016-08-22 DIAGNOSIS — L810 Postinflammatory hyperpigmentation: Secondary | ICD-10-CM | POA: Diagnosis not present

## 2016-08-23 ENCOUNTER — Telehealth: Payer: Self-pay | Admitting: Internal Medicine

## 2016-08-23 NOTE — Telephone Encounter (Signed)
Patient needs her most recent labs sent to Dr.[facility] with [facility].   Please call her to advise.   Thank you,  -LL

## 2016-08-23 NOTE — Telephone Encounter (Signed)
Called patient and spoke with Abigail Hoover and let them know that I faxed over the labs to Dr. Serita Grit office at [facility].

## 2016-08-27 ENCOUNTER — Telehealth: Payer: Self-pay | Admitting: Endocrinology

## 2016-08-27 ENCOUNTER — Other Ambulatory Visit: Payer: Self-pay

## 2016-08-27 ENCOUNTER — Telehealth: Payer: Self-pay | Admitting: Internal Medicine

## 2016-08-27 DIAGNOSIS — L600 Ingrowing nail: Secondary | ICD-10-CM | POA: Diagnosis not present

## 2016-08-27 DIAGNOSIS — L989 Disorder of the skin and subcutaneous tissue, unspecified: Secondary | ICD-10-CM | POA: Diagnosis not present

## 2016-08-27 MED ORDER — ONETOUCH SURESOFT LANCING DEV MISC
3 refills | Status: DC
Start: 2016-08-27 — End: 2018-11-03

## 2016-08-27 NOTE — Telephone Encounter (Signed)
Needs the device for the patient to use her lancets. Please send to  [place], [place] [phone] (Phone) [phone] (Fax)   Patient's husband stated he will call every 30 minutes until this is taken care of. Please advise patient as soon as possible.

## 2016-08-27 NOTE — Telephone Encounter (Signed)
Patient does not have the right tool for his lancelets. Her husband is calling and they are not sure what the thing that the lancelets go in is called, but she needs a different one.  Please call back to discuss.  Thank you,  -LL

## 2016-08-27 NOTE — Telephone Encounter (Signed)
Called patient and spoke to Abigail Hoover to let him know that both of the prescriptions have been sent to their pharmacy and to check with them to make sure they received the orders.

## 2016-08-28 ENCOUNTER — Ambulatory Visit (INDEPENDENT_AMBULATORY_CARE_PROVIDER_SITE_OTHER): Payer: Medicare Other | Admitting: Podiatry

## 2016-08-28 ENCOUNTER — Encounter: Payer: Self-pay | Admitting: Podiatry

## 2016-08-28 DIAGNOSIS — E1149 Type 2 diabetes mellitus with other diabetic neurological complication: Secondary | ICD-10-CM

## 2016-08-28 DIAGNOSIS — B351 Tinea unguium: Secondary | ICD-10-CM

## 2016-08-28 DIAGNOSIS — M79676 Pain in unspecified toe(s): Secondary | ICD-10-CM

## 2016-08-28 DIAGNOSIS — L600 Ingrowing nail: Secondary | ICD-10-CM

## 2016-08-28 NOTE — Progress Notes (Signed)
   Subjective:    Patient ID: Abigail Hoover, female    DOB: Jan 21, 1946, 71 y.o.   MRN: [id number]  HPI this patient presents the office with chief complaint of long thick painful nails. He states nails are painful walking and wearing her shoes. She also relates that she is diabetic. She is taking gabapentin for her neuropathy. He presents the office today for an evaluation of her feet as well as nail debridement.  She says her left big toe is painful due to ingrown toenail.    Review of Systems  All other systems reviewed and are negative.      Objective:   Physical Exam GENERAL APPEARANCE: Alert, conversant. Appropriately groomed. No acute distress.  VASCULAR: Pedal pulses are  palpable at  [facility] and PT bilateral.  Capillary refill time is immediate to all digits,  Normal temperature gradient.   NEUROLOGIC: sensation is normal to 5.07 monofilament at 5/5 sites bilateral.  Light touch is intact bilateral, Muscle strength normal.  MUSCULOSKELETAL: acceptable muscle strength, tone and stability bilateral.  Intrinsic muscluature intact bilateral.  Rectus appearance of foot and digits noted bilateral.   DERMATOLOGIC: skin color, texture, and turgor are within normal limits.  No preulcerative lesions or ulcers  are seen, no interdigital maceration noted.  No open lesions present.. No drainage noted.  NAILS  thick disfigured discolored hallux toenails, both feet         Assessment & Plan:  Onychomycosis  Diabetes with no complications   IE  Debride nails  RTC 4 months   Gardiner Barefoot DPM

## 2016-08-30 ENCOUNTER — Telehealth: Payer: Self-pay | Admitting: Endocrinology

## 2016-08-30 ENCOUNTER — Telehealth: Payer: Self-pay

## 2016-08-30 ENCOUNTER — Other Ambulatory Visit: Payer: Self-pay | Admitting: Endocrinology

## 2016-08-30 DIAGNOSIS — K219 Gastro-esophageal reflux disease without esophagitis: Secondary | ICD-10-CM | POA: Diagnosis not present

## 2016-08-30 DIAGNOSIS — D649 Anemia, unspecified: Secondary | ICD-10-CM | POA: Diagnosis not present

## 2016-08-30 DIAGNOSIS — E785 Hyperlipidemia, unspecified: Secondary | ICD-10-CM | POA: Diagnosis not present

## 2016-08-30 DIAGNOSIS — I25111 Atherosclerotic heart disease of native coronary artery with angina pectoris with documented spasm: Secondary | ICD-10-CM | POA: Diagnosis not present

## 2016-08-30 DIAGNOSIS — E119 Type 2 diabetes mellitus without complications: Secondary | ICD-10-CM | POA: Diagnosis not present

## 2016-08-30 DIAGNOSIS — E559 Vitamin D deficiency, unspecified: Secondary | ICD-10-CM | POA: Diagnosis not present

## 2016-08-30 DIAGNOSIS — E039 Hypothyroidism, unspecified: Secondary | ICD-10-CM | POA: Diagnosis not present

## 2016-08-30 DIAGNOSIS — I10 Essential (primary) hypertension: Secondary | ICD-10-CM | POA: Diagnosis not present

## 2016-08-30 DIAGNOSIS — I252 Old myocardial infarction: Secondary | ICD-10-CM | POA: Diagnosis not present

## 2016-08-30 NOTE — Telephone Encounter (Signed)
I have placed urgent scheduling msg to reschedule

## 2016-08-30 NOTE — Telephone Encounter (Signed)
Patient called requesting earlier appt for injection, due to tiredness and weak loss. She is scheduled for lab and injection on 6-21.

## 2016-08-30 NOTE — Telephone Encounter (Signed)
Please fax over most recent lab report to dr harwani @ [phone]

## 2016-08-30 NOTE — Telephone Encounter (Signed)
Done

## 2016-09-03 ENCOUNTER — Telehealth: Payer: Self-pay | Admitting: Hematology and Oncology

## 2016-09-03 NOTE — Telephone Encounter (Signed)
Per 6/8 schedule message moved 6/21 lab/inj to week of 6/11. Left message for patient on both numbers listed in EPIC re change and lab/injection 6/13.

## 2016-09-04 ENCOUNTER — Ambulatory Visit (HOSPITAL_BASED_OUTPATIENT_CLINIC_OR_DEPARTMENT_OTHER): Payer: Medicare Other

## 2016-09-04 ENCOUNTER — Other Ambulatory Visit (HOSPITAL_BASED_OUTPATIENT_CLINIC_OR_DEPARTMENT_OTHER): Payer: Medicare Other

## 2016-09-04 VITALS — BP 141/54 | HR 61 | Temp 97.4°F | Resp 20

## 2016-09-04 DIAGNOSIS — D631 Anemia in chronic kidney disease: Secondary | ICD-10-CM

## 2016-09-04 DIAGNOSIS — N183 Chronic kidney disease, stage 3 unspecified: Secondary | ICD-10-CM

## 2016-09-04 DIAGNOSIS — N189 Chronic kidney disease, unspecified: Secondary | ICD-10-CM

## 2016-09-04 LAB — CBC WITH DIFFERENTIAL/PLATELET
BASO%: 0.2 % (ref 0.0–2.0)
Basophils Absolute: 0 10*3/uL (ref 0.0–0.1)
EOS%: 8.7 % — ABNORMAL HIGH (ref 0.0–7.0)
Eosinophils Absolute: 0.4 10*3/uL (ref 0.0–0.5)
HCT: 30.3 % — ABNORMAL LOW (ref 34.8–46.6)
HGB: 9.4 g/dL — ABNORMAL LOW (ref 11.6–15.9)
LYMPH%: 26.8 % (ref 14.0–49.7)
MCH: 26.4 pg (ref 25.1–34.0)
MCHC: 31 g/dL — ABNORMAL LOW (ref 31.5–36.0)
MCV: 85.1 fL (ref 79.5–101.0)
MONO#: 0.3 10*3/uL (ref 0.1–0.9)
MONO%: 6.2 % (ref 0.0–14.0)
NEUT#: 2.9 10*3/uL (ref 1.5–6.5)
NEUT%: 58.1 % (ref 38.4–76.8)
Platelets: 208 10*3/uL (ref 145–400)
RBC: 3.56 10*6/uL — ABNORMAL LOW (ref 3.70–5.45)
RDW: 14.7 % — ABNORMAL HIGH (ref 11.2–14.5)
WBC: 5 10*3/uL (ref 3.9–10.3)
lymph#: 1.4 10*3/uL (ref 0.9–3.3)

## 2016-09-04 MED ORDER — DARBEPOETIN ALFA 200 MCG/0.4ML IJ SOSY
200.0000 ug | PREFILLED_SYRINGE | Freq: Once | INTRAMUSCULAR | Status: AC
  Administered 2016-09-04: 200 ug via SUBCUTANEOUS
  Filled 2016-09-04: qty 0.4

## 2016-09-04 NOTE — Patient Instructions (Signed)
Darbepoetin Alfa injection What is this medicine? DARBEPOETIN ALFA (dar be POE e tin AL fa) helps your body make more red blood cells. It is used to treat anemia caused by chronic kidney failure and chemotherapy. This medicine may be used for other purposes; ask your health care provider or pharmacist if you have questions. What should I tell my health care provider before I take this medicine? They need to know if you have any of these conditions: -blood clotting disorders or history of blood clots -cancer patient not on chemotherapy -cystic fibrosis -heart disease, such as angina, heart failure, or a history of a heart attack -hemoglobin level of 12 g/dL or greater -high blood pressure -low levels of folate, iron, or vitamin B12 -seizures -an unusual or allergic reaction to darbepoetin, erythropoietin, albumin, hamster proteins, latex, other medicines, foods, dyes, or preservatives -pregnant or trying to get pregnant -breast-feeding How should I use this medicine? This medicine is for injection into a vein or under the skin. It is usually given by a health care professional in a hospital or clinic setting. If you get this medicine at home, you will be taught how to prepare and give this medicine. Do not shake the solution before you withdraw a dose. Use exactly as directed. Take your medicine at regular intervals. Do not take your medicine more often than directed. It is important that you put your used needles and syringes in a special sharps container. Do not put them in a trash can. If you do not have a sharps container, call your pharmacist or healthcare provider to get one. Talk to your pediatrician regarding the use of this medicine in children. While this medicine may be used in children as young as 1 year for selected conditions, precautions do apply. Overdosage: If you think you have taken too much of this medicine contact a poison control center or emergency room at once. NOTE:  This medicine is only for you. Do not share this medicine with others. What if I miss a dose? If you miss a dose, take it as soon as you can. If it is almost time for your next dose, take only that dose. Do not take double or extra doses. What may interact with this medicine? Do not take this medicine with any of the following medications: -epoetin alfa This list may not describe all possible interactions. Give your health care provider a list of all the medicines, herbs, non-prescription drugs, or dietary supplements you use. Also tell them if you smoke, drink alcohol, or use illegal drugs. Some items may interact with your medicine. What should I watch for while using this medicine? Visit your prescriber or health care professional for regular checks on your progress and for the needed blood tests and blood pressure measurements. It is especially important for the doctor to make sure your hemoglobin level is in the desired range, to limit the risk of potential side effects and to give you the best benefit. Keep all appointments for any recommended tests. Check your blood pressure as directed. Ask your doctor what your blood pressure should be and when you should contact him or her. As your body makes more red blood cells, you may need to take iron, folic acid, or vitamin B supplements. Ask your doctor or health care provider which products are right for you. If you have kidney disease continue dietary restrictions, even though this medication can make you feel better. Talk with your doctor or health care professional about the   foods you eat and the vitamins that you take. What side effects may I notice from receiving this medicine? Side effects that you should report to your doctor or health care professional as soon as possible: -allergic reactions like skin rash, itching or hives, swelling of the face, lips, or tongue -breathing problems -changes in vision -chest pain -confusion, trouble speaking  or understanding -feeling faint or lightheaded, falls -high blood pressure -muscle aches or pains -pain, swelling, warmth in the leg -rapid weight gain -severe headaches -sudden numbness or weakness of the face, arm or leg -trouble walking, dizziness, loss of balance or coordination -seizures (convulsions) -swelling of the ankles, feet, hands -unusually weak or tired Side effects that usually do not require medical attention (report to your doctor or health care professional if they continue or are bothersome): -diarrhea -fever, chills (flu-like symptoms) -headaches -nausea, vomiting -redness, stinging, or swelling at site where injected This list may not describe all possible side effects. Call your doctor for medical advice about side effects. You may report side effects to FDA at 1-800-FDA-1088. Where should I keep my medicine? Keep out of the reach of children. Store in a refrigerator between 2 and 8 degrees C (36 and 46 degrees F). Do not freeze. Do not shake. Throw away any unused portion if using a single-dose vial. Throw away any unused medicine after the expiration date. NOTE: This sheet is a summary. It may not cover all possible information. If you have questions about this medicine, talk to your doctor, pharmacist, or health care provider.    2016, Elsevier/Gold Standard. (2008-02-23 10:23:57)  

## 2016-09-04 NOTE — Progress Notes (Signed)
Pt had concerns about waiting until Sept for return appointment for Aranesp Injection, stated that "her bllod may be to low by that date'. Discussed concerns with Dr. Alvy Bimler. It is noted that Pt is to see Her endocrinologist in July. Instructed pt if that her CBC count is low, she can call us with results and be assessed for the need of Aransep injection , aboveplan was suggested by Dr. Alvy Bimler.  Pt stated that she would call us if needed to be seen.

## 2016-09-12 ENCOUNTER — Other Ambulatory Visit: Payer: Medicare Other

## 2016-09-12 ENCOUNTER — Ambulatory Visit: Payer: Medicare Other

## 2016-09-17 ENCOUNTER — Ambulatory Visit: Payer: Medicare Other | Admitting: Podiatry

## 2016-09-18 DIAGNOSIS — Z124 Encounter for screening for malignant neoplasm of cervix: Secondary | ICD-10-CM | POA: Diagnosis not present

## 2016-09-18 DIAGNOSIS — Z01419 Encounter for gynecological examination (general) (routine) without abnormal findings: Secondary | ICD-10-CM | POA: Diagnosis not present

## 2016-09-27 ENCOUNTER — Other Ambulatory Visit: Payer: Self-pay | Admitting: General Surgery

## 2016-09-27 DIAGNOSIS — Z1231 Encounter for screening mammogram for malignant neoplasm of breast: Secondary | ICD-10-CM

## 2016-09-30 DIAGNOSIS — M25531 Pain in right wrist: Secondary | ICD-10-CM | POA: Diagnosis not present

## 2016-09-30 DIAGNOSIS — M79601 Pain in right arm: Secondary | ICD-10-CM | POA: Diagnosis not present

## 2016-10-01 DIAGNOSIS — N3000 Acute cystitis without hematuria: Secondary | ICD-10-CM | POA: Diagnosis not present

## 2016-10-02 ENCOUNTER — Ambulatory Visit: Payer: Medicare Other | Admitting: Endocrinology

## 2016-10-09 ENCOUNTER — Ambulatory Visit (INDEPENDENT_AMBULATORY_CARE_PROVIDER_SITE_OTHER): Payer: Medicare Other | Admitting: Endocrinology

## 2016-10-09 ENCOUNTER — Encounter: Payer: Self-pay | Admitting: Endocrinology

## 2016-10-09 VITALS — BP 112/62 | HR 64 | Ht 59.0 in | Wt 103.4 lb

## 2016-10-09 DIAGNOSIS — E039 Hypothyroidism, unspecified: Secondary | ICD-10-CM | POA: Diagnosis not present

## 2016-10-09 DIAGNOSIS — E1165 Type 2 diabetes mellitus with hyperglycemia: Secondary | ICD-10-CM | POA: Diagnosis not present

## 2016-10-09 DIAGNOSIS — E119 Type 2 diabetes mellitus without complications: Secondary | ICD-10-CM

## 2016-10-09 LAB — COMPREHENSIVE METABOLIC PANEL
ALT: 7 U/L (ref 0–35)
AST: 14 U/L (ref 0–37)
Albumin: 4.3 g/dL (ref 3.5–5.2)
Alkaline Phosphatase: 45 U/L (ref 39–117)
BUN: 27 mg/dL — ABNORMAL HIGH (ref 6–23)
CO2: 29 mEq/L (ref 19–32)
Calcium: 11.6 mg/dL — ABNORMAL HIGH (ref 8.4–10.5)
Chloride: 99 mEq/L (ref 96–112)
Creatinine, Ser: 1.59 mg/dL — ABNORMAL HIGH (ref 0.40–1.20)
GFR: 33.78 mL/min — ABNORMAL LOW (ref >60.00)
Glucose, Bld: 94 mg/dL (ref 70–99)
Potassium: 5 mEq/L (ref 3.5–5.1)
Sodium: 134 mEq/L — ABNORMAL LOW (ref 135–145)
Total Bilirubin: 0.6 mg/dL (ref 0.2–1.2)
Total Protein: 6.3 g/dL (ref 6.0–8.3)

## 2016-10-09 NOTE — Progress Notes (Signed)
Patient ID: Abigail Hoover, female   DOB: 1945-05-30, 71 y.o.   MRN: [id number]    Reason for Appointment:  follow-up of various issues  History of Present Illness   Diagnosis: Type 2 DIABETES MELITUS, long-standing  She has had mild diabetes for several years but has required multiple drugs for control Usually has had postprandial hyperglycemia, this has been better controlled with adding Glyset to her main meals Has never been on insulin or a GLP-1 drug Actos had been stopped previously because of tendency to edema  Recent history:   Oral hypoglycemic drugs:  Precose 50 mg before breakfast and dinner, Janumet 50/ 500 twice a day, Glucophage once a day   Her A1c is 5.0, previously 6.9   Current management, problems identified and blood sugar patterns:  She did not bring her monitor for download  Although she thinks her blood sugars are higher than they should be she does not think they are usually over 150 after meals unless she is eating some sweets  Also she thinks that [place] was working better than Precose but her control overall looks somewhat better  Discussed timing of taking her medication and she now is taking the Precose 50 minutes before eating  She may not be eating as much as her weight has gone down significantly  Usually not eating lunch and also not always getting protein with breakfast and suppertime  She does not do any exercise because of leg pain   Side effects from medications: None Proper timing of medications in relation to meals: Yes.          Monitors blood glucose: 1 times a day or less .    Glucometer: One Touch        Blood Glucose readings from recall: Postprandial usually 130-150 Fasting usually near 100 Lab glucose not available recently  Meals: 2- 3 meals per day.  eating small breakfast and lunch but very little protein, is vegetarian   Physical activity:  very little walking            Dietician visit: Most recent: Unknown        Complications: are mild symptoms of neuropathy, no nephropathy or retinopathy   Wt Readings from Last 3 Encounters:  10/09/16 103 lb 6.4 oz (46.9 kg)  07/26/16 114 lb 6.4 oz (51.9 kg)  06/18/16 109 lb 14.4 oz (49.9 kg)   Lab Results  Component Value Date   HGBA1C 6.9 (H) 04/18/2016   HGBA1C 5.8 01/19/2016   HGBA1C 6.6 (H) 07/13/2015   Lab Results  Component Value Date   MICROALBUR 1.0 04/18/2016   LDLCALC 75 04/19/2016   CREATININE 1.59 (H) 10/09/2016      Lab Results  Component Value Date   FRUCTOSAMINE 266 07/26/2016   FRUCTOSAMINE 230 12/07/2014     OTHER problems are in review of systems:    Allergies as of 10/09/2016      Reactions   Atorvastatin Rash   Ezetimibe Itching      Medication List       Accurate as of 10/09/16  4:20 PM. Always use your most recent med list.          acarbose 50 MG tablet Commonly known as:  PRECOSE Take 50 mg by mouth 2 (two) times daily.   aspirin 81 MG EC tablet Take 1 tablet (81 mg total) by mouth 2 (two) times daily with a meal. For 6 weeks   BAYER CONTOUR NEXT MONITOR w/Device Kit Use  to test blood sugar daily Dx code E11.49   DEXILANT 60 MG capsule Generic drug:  dexlansoprazole   gabapentin 300 MG capsule Commonly known as:  NEURONTIN Take 300 mg by mouth 4 (four) times daily.   glucose blood test strip Commonly known as:  ONETOUCH VERIO Use as instructed   ibandronate 150 MG tablet Commonly known as:  BONIVA   metFORMIN 500 MG tablet Commonly known as:  GLUCOPHAGE Take 1 tablet daily with a meal   metoprolol succinate 50 MG 24 hr tablet Commonly known as:  TOPROL-XL Take 1 tablet (50 mg total) by mouth daily. Take with or immediately following a meal.   TOPROL XL 25 MG 24 hr tablet Generic drug:  metoprolol succinate   MYRBETRIQ 50 MG Tb24 tablet Generic drug:  mirabegron ER Take 50 mg by mouth daily.   nitroGLYCERIN 0.4 MG SL tablet Commonly known as:  NITROSTAT Place 1 tablet (0.4 mg  total) under the tongue every 5 (five) minutes x 3 doses as needed for chest pain.   ONE TOUCH SURESOFT Misc Use to check blood sugars   onetouch ultrasoft lancets Use as instructed   pentosan polysulfate 100 MG capsule Commonly known as:  ELMIRON Take 100 mg by mouth 3 (three) times daily before meals.   pravastatin 20 MG tablet Commonly known as:  PRAVACHOL TAKE ONE TABLET BY MOUTH ONCE DAILY   sitaGLIPtin-metformin 50-500 MG tablet Commonly known as:  JANUMET Take 1 tablet by mouth 2 (two) times daily with a meal.   SYNTHROID 75 MCG tablet Generic drug:  levothyroxine TAKE ONE TABLET BY MOUTH ONCE DAILY       Allergies:  Allergies  Allergen Reactions  . Atorvastatin Rash  . Ezetimibe Itching    Past Medical History:  Diagnosis Date  . 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 Anxiety   . Arthritis   . Bladder irritation   . Diabetes mellitus   . GERD (gastroesophageal reflux disease)   . Hypercholesterolemia   . Hypertension   . Hypothyroidism   . Iron deficiency anemia, unspecified   . Psoriasis   . Transfusion history 03/05/2016   for postoperative (ORIF) anemia superimposed on chronic anemia    Past Surgical History:  Procedure Laterality Date  . ABDOMINAL HYSTERECTOMY     TAH  . BREAST BIOPSY     Left breast biopsy benign lesion  . BREAST BIOPSY  03/11/2012   Procedure: BREAST BIOPSY;  Surgeon: Odis Hollingshead, MD;  Location: [facility];  Service: General;  Laterality: Left;  remove left breast mass  . CARDIAC CATHETERIZATION N/A 01/02/2015   Procedure: Left Heart Cath and Coronary Angiography;  Surgeon: Charolette Forward, MD;  Location: [facility];  Service: Cardiovascular;  Laterality: N/A;  . CATARACT EXTRACTION    . CERVICAL DISC SURGERY    . COLONOSCOPY  2013   neg. next 2023.   Marland Kitchen EYE SURGERY    . LEFT HIP SURGERY    . TOTAL HIP ARTHROPLASTY Left 03/02/2016   Procedure: TOTAL HIP ARTHROPLASTY ANTERIOR APPROACH;  Surgeon: Rod Can, MD;  Location: [facility];  Service: Orthopedics;  Laterality: Left;    Family History  Problem Relation Age of Onset  . Heart disease Mother        heart attack  . Stroke Father        brain hemorrhage  . Diabetes Brother   . Cancer Brother   . Breast cancer Sister   . Cancer Sister  breast cancer    Social History:  reports that she has never smoked. She has never used smokeless tobacco. She reports that she does not drink alcohol or use drugs.  Review of Systems:  HYPERTENSION: Mild, this is well-controlled with metoprolol   RENAL dysfunction mild, she is due to see the nephrologist for further evaluation   Lab Results  Component Value Date   CREATININE 1.59 (H) 10/09/2016   BUN 27 (H) 10/09/2016   NA 134 (L) 10/09/2016   K 5.0 10/09/2016   CL 99 10/09/2016   CO2 29 10/09/2016     HYPERLIPIDEMIA: The lipid abnormality consists of elevated LDL which is mild and well controlled with pravastatin 20 mg She thinks she had a rash from Lipitor    Lab Results  Component Value Date   CHOL 171 04/19/2016   HDL 72.90 04/19/2016   LDLCALC 75 04/19/2016   TRIG 115.0 04/19/2016   CHOLHDL 2 04/19/2016    She has a long history of primary HYPOTHYROIDISM   She takes Levothyroxine on an empty stomach early in the morning Daily  TSH has been usually near normal, Consistently back to normal with reducing the dose to 75 g   Lab Results  Component Value Date   TSH 1.63 07/26/2016   TSH 2.97 04/18/2016   TSH 0.32 (L) 01/19/2016   FREET4 1.07 04/18/2016   FREET4 1.37 07/13/2015   FREET4 1.69 (H) 12/07/2014    NEUROPATHY:  No symptoms of numbness In the feet, is on Long-term gabapentin for history of paresthesia, Mostly burning    Diabetic foot exam in : Normal monofilament sensation but absent pulses   OSTEOPENIA: Her T score Previously done by gynecologist was -2.1, currently on Boniva started in 01/2014     Examination:   BP 112/62   Pulse 64    Ht $R'4\' 11"'kf$  (1.499 m)   Wt 103 lb 6.4 oz (46.9 kg)   SpO2 98%   BMI 20.88 kg/m   Body mass index is 20.88 kg/m.      Diabetic Foot Exam - Simple   Simple Foot Form Diabetic Foot exam was performed with the following findings:  Yes   Visual Inspection No deformities, no ulcerations, no other skin breakdown bilaterally:  Yes Sensation Testing Intact to touch and monofilament testing bilaterally:  Yes Pulse Check See comments:  Yes Comments Reduced pulses on the left barely palpable dorsalis pedis      ASSESSMENT/ PLAN:   Diabetes type 2, Nonobese  See history of present illness for discussion of current diabetes management, blood sugar patterns and problems identified  A1c is lower than expected at 5%, also unable to assess her home blood sugars that she did not bring her monitor and has had no lab glucose reading A1c may be affected by her history of anemia  She does not appear to have any consistently high readings either fasting or postprandial Reassured her that Precose appears to be as effective as Glyset and there is no need to change, insurance prefers [employer] Recommended that she take the Precose right before eating instead of 15 minutes before  WEIGHT loss: Etiology unclear and she needs to discuss with PCP Advised her to have at least a large snack with half Precose at lunchtime and not skip the meal   History of hypertension: Will check chemistry panel today  HYPOTHYROIDISM: TSH normal on last lab visit   Patient Instructions  1/2 Precose with small lunch  Check blood sugars on waking  up  weekly  Also check blood sugars about 2 hours after a meal and do this after different meals by rotation  Recommended blood sugar levels on waking up is 90-130 and about 2 hours after meal is 130-160  Please bring your blood sugar monitor to each visit, thank you     [facility] 10/09/2016, 4:20 PM    ADDENDUM: Creatinine clearance is 33, she needs to cut down her  Janumet to once a day.

## 2016-10-09 NOTE — Patient Instructions (Signed)
1/2 Precose with small lunch  Check blood sugars on waking up  weekly  Also check blood sugars about 2 hours after a meal and do this after different meals by rotation  Recommended blood sugar levels on waking up is 90-130 and about 2 hours after meal is 130-160  Please bring your blood sugar monitor to each visit, thank you

## 2016-10-10 ENCOUNTER — Telehealth: Payer: Self-pay

## 2016-10-10 LAB — POCT GLYCOSYLATED HEMOGLOBIN (HGB A1C): Hemoglobin A1C: 5

## 2016-10-10 NOTE — Telephone Encounter (Signed)
Patient called in with questions. I answered those and assured patient that she was suppose to only take one Janumet. Patient then understood and had no other questions.

## 2016-10-14 ENCOUNTER — Telehealth: Payer: Self-pay | Admitting: Hematology and Oncology

## 2016-10-14 NOTE — Telephone Encounter (Signed)
sw pt to confirm 1030 lab/inj appt 7/24 per sch msg

## 2016-10-15 ENCOUNTER — Other Ambulatory Visit (HOSPITAL_BASED_OUTPATIENT_CLINIC_OR_DEPARTMENT_OTHER): Payer: Medicare Other

## 2016-10-15 ENCOUNTER — Ambulatory Visit (HOSPITAL_BASED_OUTPATIENT_CLINIC_OR_DEPARTMENT_OTHER): Payer: Medicare Other

## 2016-10-15 VITALS — BP 154/76 | HR 58 | Resp 18

## 2016-10-15 DIAGNOSIS — N183 Chronic kidney disease, stage 3 unspecified: Secondary | ICD-10-CM

## 2016-10-15 DIAGNOSIS — D631 Anemia in chronic kidney disease: Secondary | ICD-10-CM | POA: Diagnosis not present

## 2016-10-15 DIAGNOSIS — N189 Chronic kidney disease, unspecified: Secondary | ICD-10-CM

## 2016-10-15 LAB — CBC WITH DIFFERENTIAL/PLATELET
BASO%: 0.8 % (ref 0.0–2.0)
Basophils Absolute: 0 10*3/uL (ref 0.0–0.1)
EOS%: 7.7 % — ABNORMAL HIGH (ref 0.0–7.0)
Eosinophils Absolute: 0.4 10*3/uL (ref 0.0–0.5)
HCT: 33.2 % — ABNORMAL LOW (ref 34.8–46.6)
HGB: 10.6 g/dL — ABNORMAL LOW (ref 11.6–15.9)
LYMPH%: 40.3 % (ref 14.0–49.7)
MCH: 26.4 pg (ref 25.1–34.0)
MCHC: 32 g/dL (ref 31.5–36.0)
MCV: 82.5 fL (ref 79.5–101.0)
MONO#: 0.3 10*3/uL (ref 0.1–0.9)
MONO%: 6.6 % (ref 0.0–14.0)
NEUT#: 2 10*3/uL (ref 1.5–6.5)
NEUT%: 44.6 % (ref 38.4–76.8)
Platelets: 189 10*3/uL (ref 145–400)
RBC: 4.02 10*6/uL (ref 3.70–5.45)
RDW: 14.4 % (ref 11.2–14.5)
WBC: 4.6 10*3/uL (ref 3.9–10.3)
lymph#: 1.9 10*3/uL (ref 0.9–3.3)

## 2016-10-15 MED ORDER — DARBEPOETIN ALFA 200 MCG/0.4ML IJ SOSY
200.0000 ug | PREFILLED_SYRINGE | Freq: Once | INTRAMUSCULAR | Status: AC
  Administered 2016-10-15: 200 ug via SUBCUTANEOUS
  Filled 2016-10-15: qty 0.4

## 2016-10-16 DIAGNOSIS — N3000 Acute cystitis without hematuria: Secondary | ICD-10-CM | POA: Diagnosis not present

## 2016-10-21 ENCOUNTER — Other Ambulatory Visit: Payer: Self-pay | Admitting: Endocrinology

## 2016-10-23 DIAGNOSIS — N390 Urinary tract infection, site not specified: Secondary | ICD-10-CM | POA: Diagnosis not present

## 2016-10-23 DIAGNOSIS — E871 Hypo-osmolality and hyponatremia: Secondary | ICD-10-CM | POA: Diagnosis not present

## 2016-10-23 DIAGNOSIS — D509 Iron deficiency anemia, unspecified: Secondary | ICD-10-CM | POA: Diagnosis not present

## 2016-10-23 DIAGNOSIS — E039 Hypothyroidism, unspecified: Secondary | ICD-10-CM | POA: Diagnosis not present

## 2016-10-24 ENCOUNTER — Telehealth: Payer: Self-pay | Admitting: Endocrinology

## 2016-10-24 ENCOUNTER — Other Ambulatory Visit: Payer: Self-pay | Admitting: Endocrinology

## 2016-10-24 NOTE — Telephone Encounter (Signed)
Routing to you °

## 2016-10-24 NOTE — Telephone Encounter (Signed)
She is supposed to take one Janumet at dinner time not stop it altogether

## 2016-10-24 NOTE — Telephone Encounter (Signed)
Call patient to discuss her blood sugar being high (189 this morning). Call patient on home phone [phone].

## 2016-10-25 NOTE — Telephone Encounter (Signed)
Patient wants to take Janumet twice a day b/c her sugar gets high with one. Please call back to discuss.  Thank you,  -LL

## 2016-10-25 NOTE — Telephone Encounter (Signed)
Left vm with the advice from note below and requested a call back if she had any further questions

## 2016-10-25 NOTE — Telephone Encounter (Signed)
Please tell her to start one tablet of Janumet at dinnertime daily, she was not supposed to have stopped it completely

## 2016-10-25 NOTE — Telephone Encounter (Signed)
She cannot take it twice a day because of her kidney function not being good, she can only take once a day

## 2016-10-28 NOTE — Telephone Encounter (Signed)
No alternative to Janumet; 1 tablet daily will be sufficient and we can see her in follow-up in 6 weeks if she is still having concerns about blood sugar

## 2016-10-28 NOTE — Telephone Encounter (Signed)
Patient states her blood sugar after breakfast is 180 and she feels the medication is not enough for her- she has agreed to take 1 tablet daily of the Janumet and make an appointment for a 6 week follow up to go over high blood sugar readings

## 2016-10-28 NOTE — Telephone Encounter (Signed)
Spoke with the patient and explained the note and she wants to ask if there is anything else that she can take instead of Janumet because of kidney function please advise

## 2016-10-30 DIAGNOSIS — S72092D Other fracture of head and neck of left femur, subsequent encounter for closed fracture with routine healing: Secondary | ICD-10-CM | POA: Diagnosis not present

## 2016-10-30 DIAGNOSIS — M7632 Iliotibial band syndrome, left leg: Secondary | ICD-10-CM | POA: Diagnosis not present

## 2016-10-31 ENCOUNTER — Telehealth: Payer: Self-pay | Admitting: Endocrinology

## 2016-10-31 NOTE — Telephone Encounter (Signed)
Please advise. This has been coming from her PCP.

## 2016-11-01 ENCOUNTER — Ambulatory Visit: Payer: Medicare Other

## 2016-11-01 ENCOUNTER — Ambulatory Visit
Admission: RE | Admit: 2016-11-01 | Discharge: 2016-11-01 | Disposition: A | Discharge status: Home / Self Care | Payer: Medicare Other | Source: Ambulatory Visit | Attending: General Surgery | Admitting: General Surgery

## 2016-11-01 ENCOUNTER — Other Ambulatory Visit: Payer: Self-pay

## 2016-11-01 DIAGNOSIS — L810 Postinflammatory hyperpigmentation: Secondary | ICD-10-CM | POA: Diagnosis not present

## 2016-11-01 DIAGNOSIS — Z1231 Encounter for screening mammogram for malignant neoplasm of breast: Secondary | ICD-10-CM

## 2016-11-01 MED ORDER — ACARBOSE 50 MG PO TABS: 50.0000 mg | ORAL_TABLET | Freq: Two times a day (BID) | ORAL | 2 refills | Status: DC

## 2016-11-01 NOTE — Telephone Encounter (Signed)
This has been ordered 

## 2016-11-01 NOTE — Telephone Encounter (Signed)
Okay to refill? 

## 2016-11-05 ENCOUNTER — Telehealth: Payer: Self-pay | Admitting: Endocrinology

## 2016-11-05 NOTE — Telephone Encounter (Signed)
Patient called and stated that she is only taking 1 of the Janumet and she stated that her blood sugars are going up and she asked if she could be put back on the other pill ( she did not state what that was??) She also stated that she is taking 1 Metformin in the morning and she is taking 2 Acarbose daily. Please advise.

## 2016-11-05 NOTE — Telephone Encounter (Signed)
Routing to you °

## 2016-11-05 NOTE — Telephone Encounter (Signed)
Called patient and spoke to Abigail Hoover and they are not able to read the blood sugar readings on the meter they have so I suggested that they could bring her meter tomorrow and I will download the readings and show you. She wants to stop taking the Janumet and go on a different medication but she does not know the name of the other medicine.

## 2016-11-05 NOTE — Telephone Encounter (Signed)
Husband calling b/c he wants his wife medication and supplies to come at the same time as his. Please call back to discuss.  Thank you,  -LL

## 2016-11-05 NOTE — Telephone Encounter (Signed)
Need to know exactly what her blood sugars are.  If the blood sugars are generally below 180 after meals that is adequate  She cannot take extra Janumet because of her abnormal kidney function as discussed before

## 2016-11-06 ENCOUNTER — Other Ambulatory Visit: Payer: Self-pay

## 2016-11-06 MED ORDER — ACARBOSE 50 MG PO TABS: ORAL_TABLET | ORAL | 4 refills | Status: DC

## 2016-11-06 NOTE — Telephone Encounter (Signed)
Patient wants to bring the meter today at 12pm. I advised the patient to wait for a phone call before coming since that is the lunch hour. Call patient to advise.

## 2016-11-08 DIAGNOSIS — I129 Hypertensive chronic kidney disease with stage 1 through stage 4 chronic kidney disease, or unspecified chronic kidney disease: Secondary | ICD-10-CM | POA: Diagnosis not present

## 2016-11-08 DIAGNOSIS — N183 Chronic kidney disease, stage 3 (moderate): Secondary | ICD-10-CM | POA: Diagnosis not present

## 2016-11-08 DIAGNOSIS — E871 Hypo-osmolality and hyponatremia: Secondary | ICD-10-CM | POA: Diagnosis not present

## 2016-11-08 DIAGNOSIS — N2581 Secondary hyperparathyroidism of renal origin: Secondary | ICD-10-CM | POA: Diagnosis not present

## 2016-11-08 DIAGNOSIS — D509 Iron deficiency anemia, unspecified: Secondary | ICD-10-CM | POA: Diagnosis not present

## 2016-11-11 DIAGNOSIS — E785 Hyperlipidemia, unspecified: Secondary | ICD-10-CM | POA: Diagnosis not present

## 2016-11-11 DIAGNOSIS — M791 Myalgia: Secondary | ICD-10-CM | POA: Diagnosis not present

## 2016-11-11 DIAGNOSIS — E559 Vitamin D deficiency, unspecified: Secondary | ICD-10-CM | POA: Diagnosis not present

## 2016-11-11 DIAGNOSIS — N189 Chronic kidney disease, unspecified: Secondary | ICD-10-CM | POA: Diagnosis not present

## 2016-11-11 DIAGNOSIS — E119 Type 2 diabetes mellitus without complications: Secondary | ICD-10-CM | POA: Diagnosis not present

## 2016-11-11 DIAGNOSIS — E039 Hypothyroidism, unspecified: Secondary | ICD-10-CM | POA: Diagnosis not present

## 2016-11-11 DIAGNOSIS — D649 Anemia, unspecified: Secondary | ICD-10-CM | POA: Diagnosis not present

## 2016-11-11 DIAGNOSIS — I252 Old myocardial infarction: Secondary | ICD-10-CM | POA: Diagnosis not present

## 2016-11-11 DIAGNOSIS — I25111 Atherosclerotic heart disease of native coronary artery with angina pectoris with documented spasm: Secondary | ICD-10-CM | POA: Diagnosis not present

## 2016-11-11 DIAGNOSIS — I129 Hypertensive chronic kidney disease with stage 1 through stage 4 chronic kidney disease, or unspecified chronic kidney disease: Secondary | ICD-10-CM | POA: Diagnosis not present

## 2016-11-13 ENCOUNTER — Telehealth: Payer: Self-pay | Admitting: Internal Medicine

## 2016-11-13 ENCOUNTER — Ambulatory Visit (INDEPENDENT_AMBULATORY_CARE_PROVIDER_SITE_OTHER): Payer: Medicare Other | Admitting: Endocrinology

## 2016-11-13 ENCOUNTER — Encounter: Payer: Self-pay | Admitting: Endocrinology

## 2016-11-13 VITALS — BP 106/50 | HR 59 | Ht 59.0 in | Wt 102.0 lb

## 2016-11-13 DIAGNOSIS — E1165 Type 2 diabetes mellitus with hyperglycemia: Secondary | ICD-10-CM

## 2016-11-13 NOTE — Patient Instructions (Signed)
Stop Metformin  Janumet 2x daily

## 2016-11-13 NOTE — Telephone Encounter (Signed)
This has already been discussed

## 2016-11-13 NOTE — Telephone Encounter (Signed)
I know Abigail Hoover came to see [place] today but wanted to send this to you also.

## 2016-11-13 NOTE — Telephone Encounter (Signed)
[facility] - Client TELEPHONE ADVICE RECORD [facility]  Patient Name: Abigail Hoover  DOB: 1945/04/02    Initial Comment Caller states c/o low blood sugar in the mornings since medications changes were made.    Nurse Assessment  Nurse: Harlow Mares, RN, Suanne Marker Date/Time Eilene Ghazi Time): 11/13/2016 1:49:03 PM  Confirm and document reason for call. If symptomatic, describe symptoms. ---Caller reports that when she had her blood taken, MD told her that her creatinine level was high. She takes Janumet. Reports that she saw a kidney MD last week. Reports that she had a bladder infection, placed on abx. Dr. Amalia Hailey gave abx for bladder infection: reports that she completed the abx. Bladder infection is better. Stopped the Janumet in the morning. Does she need to restart the Janutet. Her blood work is now indicating her kidney function is fine. Blood sugars are 70-100. usually her blood sugar gets high around lunch.  Does the patient have any new or worsening symptoms? ---Yes  Will a triage be completed? ---Yes  Related visit to physician within the last 2 weeks? ---Yes  Does the PT have any chronic conditions? (i.e. diabetes, asthma, etc.) ---Yes  List chronic conditions. ---HTN; bladder retention; high chol; diabetes;  Is this a behavioral health or substance abuse call? ---No     Guidelines    Guideline Title Affirmed Question Affirmed Notes  Diabetes - High Blood Sugar [1] Symptoms of high blood sugar (e.g., frequent urination, weak, weight loss) AND [2] not able to test blood glucose    Final Disposition User   See Physician within 24 Hours [place], RN, Suanne Marker    Comments  Appt scheduled for today at 3:30p with Dr. Dwyane Dee to discuss blood sugar and restarting the janumet.   Referrals  REFERRED TO PCP OFFICE   Disagree/Comply: Comply

## 2016-11-13 NOTE — Progress Notes (Signed)
Patient ID: Abigail Hoover, female   DOB: 05/21/1945, 71 y.o.   MRN: [id number]    Reason for Appointment:  follow-up of various issues  History of Present Illness   Diagnosis: Type 2 DIABETES MELITUS, long-standing  She has had mild diabetes for several years but has required multiple drugs for control Usually has had postprandial hyperglycemia, this has been better controlled with adding Glyset to her main meals Has never been on insulin or a GLP-1 drug Actos had been stopped previously because of tendency to edema  Recent history:   Oral hypoglycemic drugs:  Precose 50 mg before breakfast and dinner, Janumet 50/ 500  Once a day, Glucophage 2x a day   Her A1c is  Recently 5.0, previously 6.9   Current management, problems identified and blood sugar patterns:  She did not bring her monitor for download  Today   She came in because she thinks her blood sugars are not well-controlled especially with stopping 1 tablet of her Janumet about a month ago when her creatinine was 1.6  However Review of her blood sugars show that she has only occasional blood sugars over 150 after evening meal and she has checked after breakfast only twice  She only takes Precose at breakfast and supper and she thinks her lunch meal is very light  Again usually eating relatively high carbohydrate meals and not consistently getting protein  She is however having low normal blood sugars fasting and last week had a glucose of 56 when she did not eat much during the day and blood sugar was low at about 4 PM  She does not do any exercise because of leg pain   Side effects from medications: None Proper timing of medications in relation to meals: Yes.          Monitors blood glucose: 1 times a day or less .    Glucometer: One Touch        Blood Glucose readings from download  Fasting range 69-78, AFTER breakfast 183, 189 PC dinner RANGE 103-179 with median 150  Meals: 2- 3 meals per day.   eating small breakfast and lunch but very little protein, is vegetarian   Physical activity:  very little walking            Dietician visit: Most recent: Unknown       Complications: are mild symptoms of neuropathy, no nephropathy or retinopathy   Wt Readings from Last 3 Encounters:  11/13/16 102 lb (46.3 kg)  10/09/16 103 lb 6.4 oz (46.9 kg)  07/26/16 114 lb 6.4 oz (51.9 kg)   Lab Results  Component Value Date   HGBA1C 5.0 10/10/2016   HGBA1C 6.9 (H) 04/18/2016   HGBA1C 5.8 01/19/2016   Lab Results  Component Value Date   MICROALBUR 1.0 04/18/2016   LDLCALC 75 04/19/2016   CREATININE 1.59 (H) 10/09/2016      Lab Results  Component Value Date   FRUCTOSAMINE 266 07/26/2016   FRUCTOSAMINE 230 12/07/2014     OTHER problems are in review of systems:    Allergies as of 11/13/2016      Reactions   Atorvastatin Rash   Ezetimibe Itching      Medication List       Accurate as of 11/13/16  9:04 PM. Always use your most recent med list.          acarbose 50 MG tablet Commonly known as:  PRECOSE Take 1 tablet at lunch and 11/2 tablet  at dinner   aspirin 81 MG EC tablet Take 1 tablet (81 mg total) by mouth 2 (two) times daily with a meal. For 6 weeks   DEXILANT 60 MG capsule Generic drug:  dexlansoprazole   gabapentin 300 MG capsule Commonly known as:  NEURONTIN Take 300 mg by mouth 4 (four) times daily.   glucose blood test strip Commonly known as:  ONETOUCH VERIO Use as instructed   ibandronate 150 MG tablet Commonly known as:  BONIVA   magnesium oxide 400 MG tablet Commonly known as:  MAG-OX Take 400 mg by mouth 2 (two) times daily.   metoprolol succinate 50 MG 24 hr tablet Commonly known as:  TOPROL-XL Take 1 tablet (50 mg total) by mouth daily. Take with or immediately following a meal.   MYRBETRIQ 50 MG Tb24 tablet Generic drug:  mirabegron ER Take 50 mg by mouth daily.   nitroGLYCERIN 0.4 MG SL tablet Commonly known as:  NITROSTAT Place 1  tablet (0.4 mg total) under the tongue every 5 (five) minutes x 3 doses as needed for chest pain.   ONE TOUCH SURESOFT Misc Use to check blood sugars   onetouch ultrasoft lancets Use as instructed   pentosan polysulfate 100 MG capsule Commonly known as:  ELMIRON Take 100 mg by mouth 3 (three) times daily before meals.   pravastatin 20 MG tablet Commonly known as:  PRAVACHOL TAKE ONE TABLET BY MOUTH ONCE DAILY   sitaGLIPtin-metformin 50-500 MG tablet Commonly known as:  JANUMET Take 1 tablet by mouth 2 (two) times daily with a meal.   SYNTHROID 75 MCG tablet Generic drug:  levothyroxine TAKE ONE TABLET BY MOUTH ONCE DAILY       Allergies:  Allergies  Allergen Reactions  . Atorvastatin Rash  . Ezetimibe Itching    Past Medical History:  Diagnosis Date  . 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 Anxiety   . Arthritis   . Bladder irritation   . Diabetes mellitus   . GERD (gastroesophageal reflux disease)   . Hypercholesterolemia   . Hypertension   . Hypothyroidism   . Iron deficiency anemia, unspecified   . Psoriasis   . Transfusion history 03/05/2016   for postoperative (ORIF) anemia superimposed on chronic anemia    Past Surgical History:  Procedure Laterality Date  . ABDOMINAL HYSTERECTOMY     TAH  . BREAST BIOPSY     Left breast biopsy benign lesion  . BREAST BIOPSY  03/11/2012   Procedure: BREAST BIOPSY;  Surgeon: Odis Hollingshead, MD;  Location: [place];  Service: General;  Laterality: Left;  remove left breast mass  . CARDIAC CATHETERIZATION N/A 01/02/2015   Procedure: Left Heart Cath and Coronary Angiography;  Surgeon: Charolette Forward, MD;  Location: [place];  Service: Cardiovascular;  Laterality: N/A;  . CATARACT EXTRACTION    . CERVICAL DISC SURGERY    . COLONOSCOPY  2013   neg. next 2023.   Marland Kitchen EYE SURGERY    . LEFT HIP SURGERY    . TOTAL HIP ARTHROPLASTY Left 03/02/2016   Procedure: TOTAL HIP ARTHROPLASTY ANTERIOR APPROACH;   Surgeon: Rod Can, MD;  Location: [facility];  Service: Orthopedics;  Laterality: Left;    Family History  Problem Relation Age of Onset  . Heart disease Mother        heart attack  . Stroke Father        brain hemorrhage  . Diabetes Brother   . Cancer Brother   .  Breast cancer Sister   . Cancer Sister        breast cancer    Social History:  reports that she has never smoked. She has never used smokeless tobacco. She reports that she does not drink alcohol or use drugs.  Review of Systems:  HYPERTENSION: Mild, this is well-controlled with metoprolol Only, previously losartan was stopped   Recent home reading: 100/36.  Not complaining of lightheadedness  RENAL dysfunction improved Anderson creatinine was 1.2 with nephrologist, apparently worsening of renal function related to Bactrim    Lab Results  Component Value Date   CREATININE 1.59 (H) 10/09/2016   BUN 27 (H) 10/09/2016   NA 134 (L) 10/09/2016   K 5.0 10/09/2016   CL 99 10/09/2016   CO2 29 10/09/2016     HYPERLIPIDEMIA: The lipid abnormality consists of elevated LDL which is mild and well controlled with pravastatin 20 mg She thinks she had a rash from Lipitor    Lab Results  Component Value Date   CHOL 171 04/19/2016   HDL 72.90 04/19/2016   LDLCALC 75 04/19/2016   TRIG 115.0 04/19/2016   CHOLHDL 2 04/19/2016    She has a long history of primary HYPOTHYROIDISM   She takes Levothyroxine on an empty stomach in the morning Regularly  TSH has been usually near normal including recently Now taking 75 g   Lab Results  Component Value Date   TSH 1.63 07/26/2016   TSH 2.97 04/18/2016   TSH 0.32 (L) 01/19/2016   FREET4 1.07 04/18/2016   FREET4 1.37 07/13/2015   FREET4 1.69 (H) 12/07/2014    NEUROPATHY:  No symptoms of numbness In the feet, is on Long-term gabapentin for history of paresthesia, Mostly burning    Diabetic foot exam in : Normal monofilament sensation but absent  pulses   OSTEOPENIA: Her T score Previously done by gynecologist was -2.1, currently on Boniva started in 01/2014     Examination:   BP (!) 106/50   Pulse (!) 59   Ht 4\' 11"  (1.499 m)   Wt 102 lb (46.3 kg)   SpO2 98%   BMI 20.60 kg/m   Body mass index is 20.6 kg/m.        ASSESSMENT/ PLAN:   Diabetes type 2, Nonobese  See history of present illness for discussion of current diabetes management, blood sugar patterns and problems identified  She was concerned about her blood sugar being high after meals but these are not consistent She tends to have low normal sugars fasting and before supper time including a low sugar late afternoon  Since her renal function is much improved recently she can start taking Januvia/metformin combination twice a day again but stopped the metformin she is taking separately Encouraged her to have protein at every meal Needs to check more readings after breakfast   Patient Instructions  Stop Metformin  Janumet 2x daily    Jyair Kiraly 11/13/2016, 9:04 PM

## 2016-11-14 ENCOUNTER — Ambulatory Visit: Payer: Medicare Other | Attending: Internal Medicine | Admitting: Physical Therapy

## 2016-11-14 ENCOUNTER — Other Ambulatory Visit: Payer: Self-pay | Admitting: Endocrinology

## 2016-11-14 DIAGNOSIS — M25552 Pain in left hip: Secondary | ICD-10-CM | POA: Diagnosis not present

## 2016-11-14 DIAGNOSIS — R2689 Other abnormalities of gait and mobility: Secondary | ICD-10-CM | POA: Insufficient documentation

## 2016-11-14 DIAGNOSIS — R8290 Unspecified abnormal findings in urine: Secondary | ICD-10-CM | POA: Diagnosis not present

## 2016-11-14 DIAGNOSIS — M25652 Stiffness of left hip, not elsewhere classified: Secondary | ICD-10-CM | POA: Insufficient documentation

## 2016-11-14 DIAGNOSIS — N3010 Interstitial cystitis (chronic) without hematuria: Secondary | ICD-10-CM | POA: Diagnosis not present

## 2016-11-14 NOTE — Therapy (Signed)
[place], [place], [address] Phone: [phone]   Fax:  [phone]  Physical Therapy Evaluation  Patient Details  Name: Abigail Hoover MRN: [id number] Date of Birth: [birth year] Referring Provider: Dr. Rod Can  Encounter Date: 11/14/2016      PT End of Session - 11/14/16 1201    Visit Number 1   Number of Visits 12   Date for PT Re-Evaluation 12/26/16   Authorization Type Medicare/BCBS - G codes and Progress Notes   PT Start Time 1103   PT Stop Time 1145   PT Time Calculation (min) 42 min   Activity Tolerance Patient tolerated treatment well   Behavior During Therapy [facility] for tasks assessed/performed      Past Medical History:  Diagnosis Date  . 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 Anxiety   . Arthritis   . Bladder irritation   . Diabetes mellitus   . GERD (gastroesophageal reflux disease)   . Hypercholesterolemia   . Hypertension   . Hypothyroidism   . Iron deficiency anemia, unspecified   . Psoriasis   . Transfusion history 03/05/2016   for postoperative (ORIF) anemia superimposed on chronic anemia    Past Surgical History:  Procedure Laterality Date  . ABDOMINAL HYSTERECTOMY     TAH  . BREAST BIOPSY     Left breast biopsy benign lesion  . BREAST BIOPSY  03/11/2012   Procedure: BREAST BIOPSY;  Surgeon: Odis Hollingshead, MD;  Location: [facility];  Service: General;  Laterality: Left;  remove left breast mass  . CARDIAC CATHETERIZATION N/A 01/02/2015   Procedure: Left Heart Cath and Coronary Angiography;  Surgeon: Charolette Forward, MD;  Location: [place] [facility];  Service: Cardiovascular;  Laterality: N/A;  . CATARACT EXTRACTION    . CERVICAL DISC SURGERY    . COLONOSCOPY  2013   neg. next 2023.   Marland Kitchen EYE SURGERY    . LEFT HIP SURGERY    . TOTAL HIP ARTHROPLASTY Left 03/02/2016   Procedure: TOTAL HIP ARTHROPLASTY ANTERIOR APPROACH;  Surgeon: Rod Can, MD;  Location: [place];  Service: Orthopedics;  Laterality: Left;    There were no vitals filed for this visit.       Subjective Assessment - 11/14/16 1107    Subjective Pt is a 71 y/o female who presents to OPPT for Lt hip pain x 9 months.  Pt underwent Lt THA in Dec 2017 following a fall which resulted in a Lt femoral neck fx.  Pt expresses difficulty with ADLs and mobility.   Limitations Walking  bending forward   How long can you walk comfortably? 20-25 min   Patient Stated Goals improve pain, be able to do housework without pain   Currently in Pain? Yes   Pain Score 0-No pain  up to 4-5/10   Pain Location Hip   Pain Orientation Left;Anterior;Lateral   Pain Descriptors / Indicators --  unable to describe "It's pain."   Pain Type Chronic pain;Surgical pain   Pain Onset More than a month ago   Pain Frequency Intermittent   Aggravating Factors  walking, bending forward, standing up from floor   Pain Relieving Factors "nothing, it slowly, slowly gone"            [facility] PT Assessment - 11/14/16 1115      Assessment   Medical Diagnosis Lt ITB syndrome   Referring Provider Dr. Rod Can   Onset Date/Surgical Date 03/02/16   Next  MD Visit 4 months   Prior Therapy HHPT after THA     Precautions   Precautions None     Restrictions   Weight Bearing Restrictions No     Balance Screen   Has the patient fallen in the past 6 months No   Has the patient had a decrease in activity level because of a fear of falling?  No   Is the patient reluctant to leave their home because of a fear of falling?  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     Cognition   Overall Cognitive Status Within Functional Limits for tasks  assessed     ROM / Strength   AROM / PROM / Strength AROM;Strength     AROM   Overall AROM Comments LT hip ROM limited; likely due to THA     Strength   Strength Assessment Site Hip;Knee   Right/Left Hip Left   Left Hip Flexion 3/5   Left Hip Extension 2/5  unable to lift against gravity, hip flexor tightness   Left Hip External Rotation 3/5   Left Hip Internal Rotation 3/5   Left Hip ABduction 3/5   Right/Left Knee Left   Left Knee Flexion 3/5   Left Knee Extension 4/5     Flexibility   Soft Tissue Assessment /Muscle Length yes   Quadriceps tight on Lt; lateral quad tightness   ITB tight on Lt     Palpation   Palpation comment tenderness Lt TFL and Lt greater trochanter     Ambulation/Gait   Gait Pattern Decreased stance time - left;Decreased step length - right;Antalgic;Trendelenburg            Objective measurements completed on examination: See above findings.          [facility] PT Treatment/Exercise - 11/14/16 1115      Exercises   Exercises Knee/Hip     Knee/Hip Exercises: Stretches   Quad Stretch Left;1 rep;30 seconds   Quad Stretch Limitations sidelying with strap; mod cues for technique   ITB Stretch Left;1 rep;30 seconds                PT Education - 11/14/16 1201    Education provided Yes   Education Details HEP   Person(s) Educated Patient   Methods Explanation;Demonstration;Handout   Comprehension Verbalized understanding;Returned demonstration;Need further instruction             PT Long Term Goals - 11/14/16 1219      PT LONG TERM GOAL #1   Title independent with HEP   Time 6   Period Weeks   Status New   Target Date 12/26/16     PT LONG TERM GOAL #2   Title demonstrate 4/5 strength Lt hip for improved functional mobility   Time 6   Period Weeks   Status New   Target Date 12/26/16     PT LONG TERM GOAL #3   Title report ability to walk > 30 min without increase in pain for improved function   Time 6    Period Weeks   Status New   Target Date 12/26/16  PT LONG TERM GOAL #4   Title report pain < 3/10 for improved function and activity tolerance   Time 6   Period Weeks   Status New   Target Date 12/26/16                Plan - 11/14/16 1201    Clinical Impression Statement Pt is a 71 y/o female who presents to OPPT for Lt hip pain.  Pt demonstrates decreased strength and tightness and tenderness in TFL and quads, affecting gait mechanics and functional mobility.  Pt will benefit from PT to address deficits.   History and Personal Factors relevant to plan of care: Lt THA, DM   Clinical Presentation Stable   Clinical Decision Making Low   Rehab Potential Good   PT Frequency 2x / week   PT Duration 6 weeks   PT Treatment/Interventions ADLs/Self Care Home Management;Cryotherapy;Electrical Stimulation;Moist Heat;Ultrasound;Neuromuscular re-education;Balance training;Therapeutic exercise;Iontophoresis 4mg /ml Dexamethasone;Therapeutic activities;Functional mobility training;Gait training;Patient/family education;Manual techniques;Passive range of motion;Taping;Dry needling   PT Next Visit Plan review HEP, manual to TFL, glut med strengthening; pt with poor tolerance to prone so limit prone activities   Consulted and Agree with Plan of Care Patient      Patient will benefit from skilled therapeutic intervention in order to improve the following deficits and impairments:  Abnormal gait, Impaired flexibility, Decreased range of motion, Difficulty walking, Decreased mobility, Decreased strength, Increased fascial restricitons, Increased muscle spasms, Pain  Visit Diagnosis: Pain in left hip - Plan: PT plan of care cert/re-cert  Stiffness of left hip, not elsewhere classified - Plan: PT plan of care cert/re-cert  Other abnormalities of gait and mobility - Plan: PT plan of care cert/re-cert      G-Codes - 29/51/88 07/14/1219    Functional Assessment Tool Used (Outpatient Only) clinical  judgement   Functional Limitation Mobility: Walking and moving around   Mobility: Walking and Moving Around Current Status (C1660) At least 20 percent but less than 40 percent impaired, limited or restricted   Mobility: Walking and Moving Around Goal Status [phone]) At least 1 percent but less than 20 percent impaired, limited or restricted       Problem List Patient Active Problem List   Diagnosis Date Noted  . Displaced fracture of left femoral neck ([place]) 03/02/2016  . Hyperkalemia 03/01/2016  . Femoral neck fracture ([place]) 02/29/2016  . Anemia due to stage 3 (moderate) chronic renal failure treated with erythropoietin 12/18/2015  . Chronic kidney disease, stage III (moderate) 08/14/2015  . 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 Acute coronary syndrome ([place]) 01/02/2015  . Deficiency anemia 05/27/2014  . Carpal tunnel syndrome 12/09/2013  . Diabetic neuropathy ([place]) 12/09/2013  . Type 2 diabetes mellitus with neurological manifestations, controlled ([place]) 04/21/2013  . Disturbance of skin sensation 04/21/2013  . Hypothyroidism 10/30/2012  . Breast mass in female-left 03/10/2012  . Weight loss 11/28/2011  . Family history of breast cancer 08/01/2010  . Essential hypertension 02/01/2009  . EDEMA LEGS 02/01/2009       Laureen Abrahams, PT, DPT 11/14/16 12:24 PM     [facility] [address] [place], [place], [address] Phone: [phone]   Fax:  [phone]  Name: Abigail Hoover MRN: [id number] Date of Birth: [birth year]

## 2016-11-14 NOTE — Patient Instructions (Signed)
Outer Hip Stretch: Reclined IT Band Stretch (Strap)    Strap around opposite foot, pull across only as far as possible with shoulders on mat. Hold for __30__ seconds. Repeat __2-3__ times on left leg.  Copyright  VHI. All rights reserved.    Quadriceps    Bend knee and reach back with hand of same side to hold ankle or foot. Pull leg back gently to stretch front of thigh. Hold position for _30__ seconds. Repeat _2-3__ times. Do _2-3__ times per day.  Use a sheet or strap around foot for this exercise.

## 2016-11-22 ENCOUNTER — Encounter: Payer: Self-pay | Admitting: Physical Therapy

## 2016-11-22 ENCOUNTER — Ambulatory Visit: Payer: Medicare Other | Admitting: Physical Therapy

## 2016-11-22 DIAGNOSIS — M25652 Stiffness of left hip, not elsewhere classified: Secondary | ICD-10-CM | POA: Diagnosis not present

## 2016-11-22 DIAGNOSIS — R2689 Other abnormalities of gait and mobility: Secondary | ICD-10-CM

## 2016-11-22 DIAGNOSIS — M25552 Pain in left hip: Secondary | ICD-10-CM | POA: Diagnosis not present

## 2016-11-22 NOTE — Therapy (Signed)
[place], [place], [address] Phone: [phone]   Fax:  [phone]  Physical Therapy Treatment  Patient Details  Name: Abigail Hoover MRN: [id number] Date of Birth: 1946/02/05 Referring Provider: Dr. Rod Can  Encounter Date: 11/22/2016      PT End of Session - 11/22/16 1110    Visit Number 2   Number of Visits 12   Date for PT Re-Evaluation 12/26/16   Authorization Type Medicare/BCBS - G codes and Progress Notes   PT Start Time 1100   PT Stop Time 1155   PT Time Calculation (min) 55 min   Activity Tolerance Patient tolerated treatment well   Behavior During Therapy [facility] for tasks assessed/performed      Past Medical History:  Diagnosis Date  . 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 Anxiety   . Arthritis   . Bladder irritation   . Diabetes mellitus   . GERD (gastroesophageal reflux disease)   . Hypercholesterolemia   . Hypertension   . Hypothyroidism   . Iron deficiency anemia, unspecified   . Psoriasis   . Transfusion history 03/05/2016   for postoperative (ORIF) anemia superimposed on chronic anemia    Past Surgical History:  Procedure Laterality Date  . ABDOMINAL HYSTERECTOMY     TAH  . BREAST BIOPSY     Left breast biopsy benign lesion  . BREAST BIOPSY  03/11/2012   Procedure: BREAST BIOPSY;  Surgeon: Odis Hollingshead, MD;  Location: [place];  Service: General;  Laterality: Left;  remove left breast mass  . CARDIAC CATHETERIZATION N/A 01/02/2015   Procedure: Left Heart Cath and Coronary Angiography;  Surgeon: Charolette Forward, MD;  Location: [place] [facility];  Service: Cardiovascular;  Laterality: N/A;  . CATARACT EXTRACTION    . CERVICAL DISC SURGERY    . COLONOSCOPY  2013   neg. next 2023.   Marland Kitchen EYE SURGERY    . LEFT HIP SURGERY    . TOTAL HIP ARTHROPLASTY Left 03/02/2016   Procedure: TOTAL HIP ARTHROPLASTY ANTERIOR APPROACH;  Surgeon: Rod Can, MD;  Location: [place];  Service: Orthopedics;  Laterality: Left;    There were no vitals filed for this visit.      Subjective Assessment - 11/22/16 1108    Subjective Pt presenting today with left hip pain of 6/10.    Pertinent History s/p L THA 02/2016   Patient Stated Goals improve pain, be able to do housework without pain   Currently in Pain? Yes   Pain Score 6    Pain Location Hip   Pain Orientation Left   Pain Descriptors / Indicators Aching   Pain Type Chronic pain   Pain Onset More than a month ago   Pain Frequency Intermittent   Aggravating Factors  walking, bending, standing    Pain Relieving Factors nothing per pt                         [facility] Adult PT Treatment/Exercise - 11/22/16 0001      Ambulation/Gait   Gait Comments Worked on heel to toe gait pattern with equalized step length     Exercises   Exercises Knee/Hip     Knee/Hip Exercises: Stretches   Sports administrator Left;1 rep;30 seconds   Quad Stretch Limitations sidelying with strap; mod cues for technique   ITB Stretch Left;1 rep;30 seconds     Knee/Hip Exercises: Aerobic   Nustep L3, 8  minutes     Knee/Hip Exercises: Standing   Heel Raises 15 reps   Hip Flexion 15 reps   Hip Abduction 15 reps   Hip Extension 15 reps  constant verbal cues     Knee/Hip Exercises: Seated   Long Arc Quad Both;20 reps   Ball Squeeze 20 times holding 5 seconds each     Knee/Hip Exercises: Supine   Heel Slides 10 reps   Bridges 10 reps   Bridges Limitations Pt needed needed constant verbal cues and tactile cues   Straight Leg Raises Strengthening;2 sets;10 reps   Other Supine Knee/Hip Exercises Supine clam shells: x 10 with yellow theraband     Knee/Hip Exercises: Sidelying   Clams 15 reps x 2 sets  stabalizing back to keep from rotating     Modalities   Modalities Moist Heat     Moist Heat Therapy   Number Minutes Moist Heat 10 Minutes   Moist Heat Location Hip  left     Manual Therapy   Manual Therapy  Soft tissue mobilization   Manual therapy comments STW to left IT band and TFL                PT Education - 11/22/16 1110    Education provided Yes   Education Details Reviewed HEP   Person(s) Educated Patient   Methods Explanation;Demonstration   Comprehension Verbalized understanding;Returned demonstration             PT Long Term Goals - 11/22/16 1114      PT LONG TERM GOAL #1   Title independent with HEP   Time 6   Period Weeks   Status New     PT LONG TERM GOAL #2   Title demonstrate 4/5 strength Lt hip for improved functional mobility   Time 6   Period Weeks   Status New     PT LONG TERM GOAL #3   Title report ability to walk > 30 min without increase in pain for improved function   Time 6   Period Weeks   Status New     PT LONG TERM GOAL #4   Title report pain < 3/10 for improved function and activity tolerance   Period Weeks   Status New               Plan - 11/22/16 1111    Clinical Impression Statement Pt presenting today to therapy complaining of 6/10 left hip pain. Pt reviewed Home exercises and tolerated new exercises well with constant verbal and tactile education/cutes. Hip flexor and quad stretching. Progressed with gait training with heel to toe gait pattern. Continue with skilled PT to progress toward LTG's.    Rehab Potential Good   PT Frequency 2x / week   PT Treatment/Interventions ADLs/Self Care Home Management;Cryotherapy;Electrical Stimulation;Moist Heat;Ultrasound;Neuromuscular re-education;Balance training;Therapeutic exercise;Iontophoresis 4mg /ml Dexamethasone;Therapeutic activities;Functional mobility training;Gait training;Patient/family education;Manual techniques;Passive range of motion;Taping;Dry needling   PT Next Visit Plan review HEP, manual to TFL, glut med strengthening; pt with poor tolerance to prone so limit prone activities   Consulted and Agree with Plan of Care Patient      Patient will benefit from  skilled therapeutic intervention in order to improve the following deficits and impairments:  Abnormal gait, Impaired flexibility, Decreased range of motion, Difficulty walking, Decreased mobility, Decreased strength, Increased fascial restricitons, Increased muscle spasms, Pain  Visit Diagnosis: Pain in left hip  Stiffness of left hip, not elsewhere classified  Other abnormalities of gait and mobility  Problem List Patient Active Problem List   Diagnosis Date Noted  . Displaced fracture of left femoral neck ([place]) 03/02/2016  . Hyperkalemia 03/01/2016  . Femoral neck fracture ([place]) 02/29/2016  . Anemia due to stage 3 (moderate) chronic renal failure treated with erythropoietin 12/18/2015  . Chronic kidney disease, stage III (moderate) 08/14/2015  . 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 Acute coronary syndrome ([place]) 01/02/2015  . Deficiency anemia 05/27/2014  . Carpal tunnel syndrome 12/09/2013  . Diabetic neuropathy ([place]) 12/09/2013  . Type 2 diabetes mellitus with neurological manifestations, controlled ([place]) 04/21/2013  . Disturbance of skin sensation 04/21/2013  . Hypothyroidism 10/30/2012  . Breast mass in female-left 03/10/2012  . Weight loss 11/28/2011  . Family history of breast cancer 08/01/2010  . Essential hypertension 02/01/2009  . EDEMA LEGS 02/01/2009   Kearney Hard, PT 11/22/16 11:49 AM     Edmonia Lynch Martin8/31/2018, 11:49 AM  [facility] [address] [place], [place], [address] Phone: [phone]   Fax:  [phone]  Name: Abigail Hoover MRN: [id number] Date of Birth: 1946/01/22

## 2016-11-27 ENCOUNTER — Encounter: Payer: Medicare Other | Admitting: Physical Therapy

## 2016-12-02 ENCOUNTER — Ambulatory Visit: Payer: Medicare Other | Attending: Internal Medicine | Admitting: Physical Therapy

## 2016-12-02 DIAGNOSIS — R2689 Other abnormalities of gait and mobility: Secondary | ICD-10-CM | POA: Diagnosis not present

## 2016-12-02 DIAGNOSIS — M25652 Stiffness of left hip, not elsewhere classified: Secondary | ICD-10-CM | POA: Diagnosis not present

## 2016-12-02 DIAGNOSIS — M25552 Pain in left hip: Secondary | ICD-10-CM | POA: Diagnosis not present

## 2016-12-02 NOTE — Patient Instructions (Signed)
Piriformis Stretch    Lying on back, pull left knee toward opposite shoulder. Hold __30__ seconds. Repeat __2-3__ times. Do __2-3__ sessions per day.  Bridging    Slowly raise buttocks from floor, and hold for 5 seconds. Repeat __10__ times per set. Do __1__ sets per session. Do __2-3__ sessions per day.  Abduction: Clam (Eccentric) - Side-Lying    Lie on side with knees bent. Lift top knee, keeping feet together. Keep trunk steady. Slowly lower for 3-5 seconds. _10__ reps per set, __2-3_ sets per day, __7_ days per week.    Functional Quadriceps: Sit to Stand    Sit on edge of chair, feet flat on floor. Stand upright, extending knees fully. Repeat __10__ times per set. Do __1__ sets per session. Do __2-3__ sessions per day.

## 2016-12-02 NOTE — Therapy (Signed)
[place], [place], [address] Phone: [phone]   Fax:  [phone]  Physical Therapy Treatment  Patient Details  Name: Abigail Hoover MRN: [id number] Date of Birth: [birth year] Referring Provider: Dr. Rod Can  Encounter Date: 12/02/2016      PT End of Session - 12/02/16 1138    Visit Number 3   Number of Visits 12   Date for PT Re-Evaluation 12/26/16   Authorization Type Medicare/BCBS - G codes and Progress Notes   PT Start Time 1055   PT Stop Time 1148   PT Time Calculation (min) 53 min   Activity Tolerance Patient tolerated treatment well   Behavior During Therapy [facility] for tasks assessed/performed      Past Medical History:  Diagnosis Date  . 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 Anxiety   . Arthritis   . Bladder irritation   . Diabetes mellitus   . GERD (gastroesophageal reflux disease)   . Hypercholesterolemia   . Hypertension   . Hypothyroidism   . Iron deficiency anemia, unspecified   . Psoriasis   . Transfusion history 03/05/2016   for postoperative (ORIF) anemia superimposed on chronic anemia    Past Surgical History:  Procedure Laterality Date  . ABDOMINAL HYSTERECTOMY     TAH  . BREAST BIOPSY     Left breast biopsy benign lesion  . BREAST BIOPSY  03/11/2012   Procedure: BREAST BIOPSY;  Surgeon: Odis Hollingshead, MD;  Location: [place];  Service: General;  Laterality: Left;  remove left breast mass  . CARDIAC CATHETERIZATION N/A 01/02/2015   Procedure: Left Heart Cath and Coronary Angiography;  Surgeon: Charolette Forward, MD;  Location: [place] [facility];  Service: Cardiovascular;  Laterality: N/A;  . CATARACT EXTRACTION    . CERVICAL DISC SURGERY    . COLONOSCOPY  2013   neg. next 2023.   Marland Kitchen EYE SURGERY    . LEFT HIP SURGERY    . TOTAL HIP ARTHROPLASTY Left 03/02/2016   Procedure: TOTAL HIP ARTHROPLASTY ANTERIOR APPROACH;  Surgeon: Rod Can, MD;  Location: [facility];  Service: Orthopedics;  Laterality: Left;    There were no vitals filed for this visit.      Subjective Assessment - 12/02/16 1058    Subjective still having some pain; but it is getting better.  has only done her exercises once, "I go home and I get lazy."   Pertinent History s/p L THA 02/2016   Patient Stated Goals improve pain, be able to do housework without pain   Currently in Pain? Yes   Pain Score 5   "4-5"   Pain Location Hip   Pain Orientation Left   Pain Descriptors / Indicators Aching   Pain Type Chronic pain;Surgical pain   Pain Onset More than a month ago   Pain Frequency Intermittent   Aggravating Factors  walking, bending, standing   Pain Relieving Factors nothing per pt                         [facility] Adult PT Treatment/Exercise - 12/02/16 1101      Knee/Hip Exercises: Stretches   ITB Stretch Left;2 reps;30 seconds   Piriformis Stretch Left;2 reps;30 seconds   Piriformis Stretch Limitations knee to opposite shoulder     Knee/Hip Exercises: Aerobic   Nustep L5 x 8 min     Knee/Hip Exercises: Standing   Hip Abduction 15 reps;Both;Knee straight  Hip Extension Both;15 reps;Knee straight   Extension Limitations improved technique     Knee/Hip Exercises: Seated   Long Arc Quad Left;10 reps;Weights   Long Arc Quad Weight 2 lbs.   Long [employer] Limitations with ball squeeze   Sit to [employer] 10 reps;without UE support  cues for weight shifting     Knee/Hip Exercises: Supine   Bridges 10 reps     Knee/Hip Exercises: Sidelying   Clams Left; 10 reps     Modalities   Modalities Moist Heat     Moist Heat Therapy   Number Minutes Moist Heat 10 Minutes   Moist Heat Location Hip  left                PT Education - 12/02/16 1138    Education provided Yes   Education Details HEP   Person(s) Educated Patient   Methods Explanation;Demonstration;Handout   Comprehension Verbalized understanding;Returned demonstration;Need further  instruction             PT Long Term Goals - 11/22/16 1114      PT LONG TERM GOAL #1   Title independent with HEP   Time 6   Period Weeks   Status New     PT LONG TERM GOAL #2   Title demonstrate 4/5 strength Lt hip for improved functional mobility   Time 6   Period Weeks   Status New     PT LONG TERM GOAL #3   Title report ability to walk > 30 min without increase in pain for improved function   Time 6   Period Weeks   Status New     PT LONG TERM GOAL #4   Title report pain < 3/10 for improved function and activity tolerance   Period Weeks   Status New               Plan - 12/02/16 1139    Clinical Impression Statement Pt reports pain levels decreased from previous session, but still concerned that pain is present.  Pt has only attended 2 PT visits and reports limited compliance with HEP, so reinforced importance of compliance with HEP in order to ensure maximal rehab potential.  Will continue to benefit from PT to maximize function.   PT Treatment/Interventions ADLs/Self Care Home Management;Cryotherapy;Electrical Stimulation;Moist Heat;Ultrasound;Neuromuscular re-education;Balance training;Therapeutic exercise;Iontophoresis 4mg /ml Dexamethasone;Therapeutic activities;Functional mobility training;Gait training;Patient/family education;Manual techniques;Passive range of motion;Taping;Dry needling   PT Next Visit Plan review HEP, manual to TFL, glut med strengthening; pt with poor tolerance to prone so limit prone activities   Consulted and Agree with Plan of Care Patient      Patient will benefit from skilled therapeutic intervention in order to improve the following deficits and impairments:  Abnormal gait, Impaired flexibility, Decreased range of motion, Difficulty walking, Decreased mobility, Decreased strength, Increased fascial restricitons, Increased muscle spasms, Pain  Visit Diagnosis: Pain in left hip  Stiffness of left hip, not elsewhere  classified  Other abnormalities of gait and mobility     Problem List Patient Active Problem List   Diagnosis Date Noted  . Displaced fracture of left femoral neck ([place]) 03/02/2016  . Hyperkalemia 03/01/2016  . Femoral neck fracture ([place]) 02/29/2016  . Anemia due to stage 3 (moderate) chronic renal failure treated with erythropoietin 12/18/2015  . Chronic kidney disease, stage III (moderate) 08/14/2015  . 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 Acute coronary syndrome ([place]) 01/02/2015  . Deficiency anemia 05/27/2014  . Carpal tunnel syndrome 12/09/2013  .  Diabetic neuropathy ([place]) 12/09/2013  . Type 2 diabetes mellitus with neurological manifestations, controlled ([place]) 04/21/2013  . Disturbance of skin sensation 04/21/2013  . Hypothyroidism 10/30/2012  . Breast mass in female-left 03/10/2012  . Weight loss 11/28/2011  . Family history of breast cancer 08/01/2010  . Essential hypertension 02/01/2009  . EDEMA LEGS 02/01/2009      Laureen Abrahams, PT, DPT 12/02/16 11:41 AM    [facility] [address] [place], [place], [address] Phone: [phone]   Fax:  [phone]  Name: Abigail Hoover MRN: [id number] Date of Birth: 08-Mar-1946

## 2016-12-03 ENCOUNTER — Encounter: Payer: Self-pay | Admitting: Podiatry

## 2016-12-03 ENCOUNTER — Ambulatory Visit (INDEPENDENT_AMBULATORY_CARE_PROVIDER_SITE_OTHER): Payer: Medicare Other | Admitting: Podiatry

## 2016-12-03 ENCOUNTER — Ambulatory Visit: Payer: Medicare Other | Admitting: Podiatry

## 2016-12-03 DIAGNOSIS — M79676 Pain in unspecified toe(s): Secondary | ICD-10-CM | POA: Diagnosis not present

## 2016-12-03 DIAGNOSIS — B351 Tinea unguium: Secondary | ICD-10-CM | POA: Diagnosis not present

## 2016-12-03 DIAGNOSIS — E1149 Type 2 diabetes mellitus with other diabetic neurological complication: Secondary | ICD-10-CM

## 2016-12-03 NOTE — Progress Notes (Signed)
   Subjective:    Patient ID: Abigail Hoover, female    DOB: 1945-04-19, 71 y.o.   MRN: [id number]  HPI this patient presents the office with chief complaint of long thick painful nails. He states nails are painful walking and wearing her shoes. She also relates that she is diabetic. She is taking gabapentin for her neuropathy. He presents the office today for an evaluation of her feet as well as nail debridement.     Review of Systems  All other systems reviewed and are negative.      Objective:   Physical Exam GENERAL APPEARANCE: Alert, conversant. Appropriately groomed. No acute distress.  VASCULAR: Pedal pulses are  palpable at  [facility] and PT bilateral.  Capillary refill time is immediate to all digits,  Normal temperature gradient.   NEUROLOGIC: sensation is normal to 5.07 monofilament at 5/5 sites bilateral.  Light touch is intact bilateral, Muscle strength normal.  MUSCULOSKELETAL: acceptable muscle strength, tone and stability bilateral.  Intrinsic muscluature intact bilateral.  Rectus appearance of foot and digits noted bilateral.   DERMATOLOGIC: skin color, texture, and turgor are within normal limits.  No preulcerative lesions or ulcers  are seen, no interdigital maceration noted.  No open lesions present.. No drainage noted.  NAILS  thick disfigured discolored hallux toenails, both feet         Assessment & Plan:  Onychomycosis  Diabetes with neuropathy   IE  Debride nails  RTC 3 months   Gardiner Barefoot DPM

## 2016-12-04 ENCOUNTER — Encounter: Payer: Self-pay | Admitting: Physical Therapy

## 2016-12-04 ENCOUNTER — Ambulatory Visit: Payer: Medicare Other | Admitting: Physical Therapy

## 2016-12-04 DIAGNOSIS — R2689 Other abnormalities of gait and mobility: Secondary | ICD-10-CM

## 2016-12-04 DIAGNOSIS — M25652 Stiffness of left hip, not elsewhere classified: Secondary | ICD-10-CM | POA: Diagnosis not present

## 2016-12-04 DIAGNOSIS — M25552 Pain in left hip: Secondary | ICD-10-CM

## 2016-12-04 NOTE — Therapy (Signed)
[place], [place], [address] Phone: [phone]   Fax:  [phone]  Physical Therapy Treatment  Patient Details  Name: Abigail Hoover MRN: [id number] Date of Birth: 07/28/1945 Referring Provider: Dr. Rod Can  Encounter Date: 12/04/2016      PT End of Session - 12/04/16 1110    Visit Number 4   Number of Visits 12   Date for PT Re-Evaluation 12/26/16   Authorization Type Medicare/BCBS - G codes and Progress Notes   PT Start Time 1103   PT Stop Time 1146   PT Time Calculation (min) 43 min   Activity Tolerance Patient tolerated treatment well   Behavior During Therapy [facility] for tasks assessed/performed      Past Medical History:  Diagnosis Date  . 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 Anxiety   . Arthritis   . Bladder irritation   . Diabetes mellitus   . GERD (gastroesophageal reflux disease)   . Hypercholesterolemia   . Hypertension   . Hypothyroidism   . Iron deficiency anemia, unspecified   . Psoriasis   . Transfusion history 03/05/2016   for postoperative (ORIF) anemia superimposed on chronic anemia    Past Surgical History:  Procedure Laterality Date  . ABDOMINAL HYSTERECTOMY     TAH  . BREAST BIOPSY     Left breast biopsy benign lesion  . BREAST BIOPSY  03/11/2012   Procedure: BREAST BIOPSY;  Surgeon: Odis Hollingshead, MD;  Location: [place];  Service: General;  Laterality: Left;  remove left breast mass  . CARDIAC CATHETERIZATION N/A 01/02/2015   Procedure: Left Heart Cath and Coronary Angiography;  Surgeon: Charolette Forward, MD;  Location: [place] [facility];  Service: Cardiovascular;  Laterality: N/A;  . CATARACT EXTRACTION    . CERVICAL DISC SURGERY    . COLONOSCOPY  2013   neg. next 2023.   Marland Kitchen EYE SURGERY    . LEFT HIP SURGERY    . TOTAL HIP ARTHROPLASTY Left 03/02/2016   Procedure: TOTAL HIP ARTHROPLASTY ANTERIOR APPROACH;  Surgeon: Rod Can, MD;  Location: [place];  Service: Orthopedics;  Laterality: Left;    There were no vitals filed for this visit.      Subjective Assessment - 12/04/16 1103    Subjective "I think the things are staying the same in the hip. I try to do the exercises when i can but I do miss some days"   Currently in Pain? Yes   Pain Score 4    Pain Location Hip   Pain Orientation Left   Aggravating Factors  standing, getting up from a seated position   Pain Relieving Factors nothing makes it better                         [facility] Adult PT Treatment/Exercise - 12/04/16 1109      Knee/Hip Exercises: Stretches   Hip Flexor Stretch 2 reps;30 seconds  verbal cues for confidence for stretch due to pt fear fallin     Knee/Hip Exercises: Aerobic   Nustep L5 x 8 min UE/LE     Knee/Hip Exercises: Standing   Hip Extension 2 sets;10 reps  with bil HHA on counter for balance     Knee/Hip Exercises: Seated   Long Arc Quad Left;10 reps;Weights   Long Arc Quad Weight 2 lbs.   Long [employer] Limitations with ball squeeze   Sit to [employer] 1 set;10 reps;without  UE support  pushing hands from thighs. avoid using back of legs      Knee/Hip Exercises: Supine   Bridges 2 sets;10 reps  with ball squeeze     Manual Therapy   Manual therapy comments DTM and manual trigger point release along rectus femoris using tennis ball   how to perform at home                PT Education - 12/04/16 1145    Education provided Yes   Education Details updated HEP for sit to stand and bridge   Person(s) Educated Patient   Methods Explanation;Verbal cues   Comprehension Verbalized understanding;Verbal cues required             PT Long Term Goals - 11/22/16 1114      PT LONG TERM GOAL #1   Title independent with HEP   Time 6   Period Weeks   Status New     PT LONG TERM GOAL #2   Title demonstrate 4/5 strength Lt hip for improved functional mobility   Time 6   Period Weeks   Status New     PT LONG TERM  GOAL #3   Title report ability to walk > 30 min without increase in pain for improved function   Time 6   Period Weeks   Status New     PT LONG TERM GOAL #4   Title report pain < 3/10 for improved function and activity tolerance   Period Weeks   Status New               Plan - 12/04/16 1211    Clinical Impression Statement pt reports continued pain and tightness in the L anterior hip. continued hip strengthening and manual techniqus to calm down tightness. she required verbal cues to avoid pushing the back of her legs of the chair with sit to stand. educated about soft tissue work and how to perform at home. post session she reported no rpeort of pain    PT Next Visit Plan review HEP, manual to TFL, glut med strengthening; pt with poor tolerance to prone so limit prone activities   PT Home Exercise Plan sit to stand,       Patient will benefit from skilled therapeutic intervention in order to improve the following deficits and impairments:  Abnormal gait, Impaired flexibility, Decreased range of motion, Difficulty walking, Decreased mobility, Decreased strength, Increased fascial restricitons, Increased muscle spasms, Pain  Visit Diagnosis: Pain in left hip  Stiffness of left hip, not elsewhere classified  Other abnormalities of gait and mobility     Problem List Patient Active Problem List   Diagnosis Date Noted  . Displaced fracture of left femoral neck ([place]) 03/02/2016  . Hyperkalemia 03/01/2016  . Femoral neck fracture ([place]) 02/29/2016  . Anemia due to stage 3 (moderate) chronic renal failure treated with erythropoietin 12/18/2015  . Chronic kidney disease, stage III (moderate) 08/14/2015  . 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 Acute coronary syndrome ([place]) 01/02/2015  . Deficiency anemia 05/27/2014  . Carpal tunnel syndrome 12/09/2013  . Diabetic neuropathy ([place]) 12/09/2013  . Type 2 diabetes mellitus with neurological manifestations, controlled  ([place]) 04/21/2013  . Disturbance of skin sensation 04/21/2013  . Hypothyroidism 10/30/2012  . Breast mass in female-left 03/10/2012  . Weight loss 11/28/2011  . Family history of breast cancer 08/01/2010  . Essential hypertension 02/01/2009  . EDEMA LEGS 02/01/2009   Kristoffer Leamon PT, DPT, LAT,  ATC  12/04/16  12:16 PM      [place], [place], [address] Phone: [phone]   Fax:  [phone]  Name: Abigail Hoover MRN: [id number] Date of Birth: 1945-03-27

## 2016-12-09 ENCOUNTER — Ambulatory Visit: Payer: Medicare Other | Admitting: Physical Therapy

## 2016-12-09 ENCOUNTER — Encounter: Payer: Self-pay | Admitting: Physical Therapy

## 2016-12-09 DIAGNOSIS — M25552 Pain in left hip: Secondary | ICD-10-CM | POA: Diagnosis not present

## 2016-12-09 DIAGNOSIS — R2689 Other abnormalities of gait and mobility: Secondary | ICD-10-CM

## 2016-12-09 DIAGNOSIS — M25652 Stiffness of left hip, not elsewhere classified: Secondary | ICD-10-CM | POA: Diagnosis not present

## 2016-12-09 NOTE — Therapy (Signed)
[place], [place], [address] Phone: [phone]   Fax:  [phone]  Physical Therapy Treatment  Patient Details  Name: Abigail Hoover MRN: [id number] Date of Birth: 1945/05/21 Referring Provider: Dr. Rod Can  Encounter Date: 12/09/2016      PT End of Session - 12/09/16 1113    Visit Number 5   Number of Visits 12   Date for PT Re-Evaluation 12/26/16   Authorization Type Medicare/BCBS - G codes and Progress Notes   PT Start Time 1100   PT Stop Time 1152   PT Time Calculation (min) 52 min   Activity Tolerance Patient tolerated treatment well   Behavior During Therapy [facility] for tasks assessed/performed      Past Medical History:  Diagnosis Date  . 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 Anxiety   . Arthritis   . Bladder irritation   . Diabetes mellitus   . GERD (gastroesophageal reflux disease)   . Hypercholesterolemia   . Hypertension   . Hypothyroidism   . Iron deficiency anemia, unspecified   . Psoriasis   . Transfusion history 03/05/2016   for postoperative (ORIF) anemia superimposed on chronic anemia    Past Surgical History:  Procedure Laterality Date  . ABDOMINAL HYSTERECTOMY     TAH  . BREAST BIOPSY     Left breast biopsy benign lesion  . BREAST BIOPSY  03/11/2012   Procedure: BREAST BIOPSY;  Surgeon: Odis Hollingshead, MD;  Location: [place];  Service: General;  Laterality: Left;  remove left breast mass  . CARDIAC CATHETERIZATION N/A 01/02/2015   Procedure: Left Heart Cath and Coronary Angiography;  Surgeon: Charolette Forward, MD;  Location: [place] [facility];  Service: Cardiovascular;  Laterality: N/A;  . CATARACT EXTRACTION    . CERVICAL DISC SURGERY    . COLONOSCOPY  2013   neg. next 2023.   Marland Kitchen EYE SURGERY    . LEFT HIP SURGERY    . TOTAL HIP ARTHROPLASTY Left 03/02/2016   Procedure: TOTAL HIP ARTHROPLASTY ANTERIOR APPROACH;  Surgeon: Rod Can, MD;  Location: [place];  Service: Orthopedics;  Laterality: Left;    There were no vitals filed for this visit.      Subjective Assessment - 12/09/16 1105    Subjective Pt arriving to therapy reporting left hip pain which radiates down her leg. Pt reporting some days are better than others   Pertinent History s/p L THA 02/2016   Limitations Walking   How long can you walk comfortably? 20-25 min   Patient Stated Goals improve pain, be able to do housework without pain   Currently in Pain? Yes   Pain Score 4    Pain Location Hip   Pain Orientation Left   Pain Descriptors / Indicators Aching   Pain Type Chronic pain   Pain Onset More than a month ago   Pain Frequency Intermittent   Aggravating Factors  standing, getting up from a seated position   Pain Relieving Factors pt reporting intermittent pain that comes suddenly, pt reporting nothing seems to help                         [facility] Adult PT Treatment/Exercise - 12/09/16 0001      Knee/Hip Exercises: Stretches   Hip Flexor Stretch 2 reps;30 seconds  verbal cues for confidence for stretch due to pt fear fallin     Knee/Hip Exercises: Aerobic  Nustep L5 x 8 min UE/LE     Knee/Hip Exercises: Standing   Hip Extension 2 sets;10 reps  with bil HHA on counter for balance     Knee/Hip Exercises: Seated   Long Arc Quad Left;10 reps;Weights   Long Arc Quad Weight 2 lbs.   Long [employer] Limitations with ball squeeze   Sit to [employer] 2 sets;10 reps;without UE support  pushing hands from thighs. avoid using back of legs      Knee/Hip Exercises: Supine   Bridges 2 sets;10 reps   Bridges Limitations with ball between her knees     Knee/Hip Exercises: Sidelying   Clams left 10 reps x 2     Modalities   Modalities Moist Heat     Moist Heat Therapy   Number Minutes Moist Heat 10 Minutes   Moist Heat Location Hip  left     Manual Therapy   Manual therapy comments manual trigger point release along rectus femoris using tennis ball    how to perform at home                PT Education - 12/09/16 1113    Education Details worked on sit to Copy) Educated Patient   Methods Explanation;Demonstration   Comprehension Verbalized understanding;Returned demonstration             PT Long Term Goals - 12/09/16 1117      PT LONG TERM GOAL #1   Title independent with HEP   Status On-going     PT LONG TERM GOAL #2   Title demonstrate 4/5 strength Lt hip for improved functional mobility   Status On-going     PT LONG TERM GOAL #3   Title report ability to walk > 30 min without increase in pain for improved function   Status On-going     PT LONG TERM GOAL #4   Title report pain < 3/10 for improved function and activity tolerance   Period Weeks   Status On-going               Plan - 12/09/16 1115    Clinical Impression Statement Pt reporting pain in her left hip at beginnign of session, by end pt reporting less pain following strenthening and STW  to L glut med and IT band. Pt progresing with sit to stand with not having to stabalize with her posterior knees this session using No UE support. Continue with skilld PT as pt tolerates to progress toward goals set.    Rehab Potential Good   PT Frequency 2x / week   PT Duration 6 weeks   PT Treatment/Interventions ADLs/Self Care Home Management;Cryotherapy;Electrical Stimulation;Moist Heat;Ultrasound;Neuromuscular re-education;Balance training;Therapeutic exercise;Iontophoresis 4mg /ml Dexamethasone;Therapeutic activities;Functional mobility training;Gait training;Patient/family education;Manual techniques;Passive range of motion;Taping;Dry needling   PT Next Visit Plan review HEP, manual to TFL, glut med strengthening; pt with poor tolerance to prone so limit prone activities   PT Home Exercise Plan sit to stand,    Consulted and Agree with Plan of Care Patient      Patient will benefit from skilled therapeutic intervention in  order to improve the following deficits and impairments:  Abnormal gait, Impaired flexibility, Decreased range of motion, Difficulty walking, Decreased mobility, Decreased strength, Increased fascial restricitons, Increased muscle spasms, Pain  Visit Diagnosis: Pain in left hip  Stiffness of left hip, not elsewhere classified  Other abnormalities of gait and mobility     Problem List Patient Active Problem List  Diagnosis Date Noted  . Displaced fracture of left femoral neck ([place]) 03/02/2016  . Hyperkalemia 03/01/2016  . Femoral neck fracture ([place]) 02/29/2016  . Anemia due to stage 3 (moderate) chronic renal failure treated with erythropoietin 12/18/2015  . Chronic kidney disease, stage III (moderate) 08/14/2015  . 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 Acute coronary syndrome ([place]) 01/02/2015  . Deficiency anemia 05/27/2014  . Carpal tunnel syndrome 12/09/2013  . Diabetic neuropathy ([place]) 12/09/2013  . Type 2 diabetes mellitus with neurological manifestations, controlled ([place]) 04/21/2013  . Disturbance of skin sensation 04/21/2013  . Hypothyroidism 10/30/2012  . Breast mass in female-left 03/10/2012  . Weight loss 11/28/2011  . Family history of breast cancer 08/01/2010  . Essential hypertension 02/01/2009  . EDEMA LEGS 02/01/2009    Oretha Caprice, MPT 12/09/2016, 11:38 AM  [facility] [address] [place], [place], [address] Phone: [phone]   Fax:  [phone]  Name: Abigail Hoover MRN: [id number] Date of Birth: April 18, 1945

## 2016-12-11 ENCOUNTER — Ambulatory Visit: Payer: Medicare Other | Admitting: Physical Therapy

## 2016-12-12 ENCOUNTER — Ambulatory Visit: Payer: Medicare Other | Admitting: Hematology and Oncology

## 2016-12-12 ENCOUNTER — Ambulatory Visit: Payer: Medicare Other

## 2016-12-12 ENCOUNTER — Other Ambulatory Visit: Payer: Self-pay | Admitting: Hematology and Oncology

## 2016-12-12 ENCOUNTER — Telehealth: Payer: Self-pay | Admitting: Hematology and Oncology

## 2016-12-12 ENCOUNTER — Other Ambulatory Visit (HOSPITAL_BASED_OUTPATIENT_CLINIC_OR_DEPARTMENT_OTHER): Payer: Medicare Other

## 2016-12-12 DIAGNOSIS — D631 Anemia in chronic kidney disease: Secondary | ICD-10-CM

## 2016-12-12 DIAGNOSIS — N183 Chronic kidney disease, stage 3 (moderate): Secondary | ICD-10-CM | POA: Diagnosis not present

## 2016-12-12 DIAGNOSIS — D539 Nutritional anemia, unspecified: Secondary | ICD-10-CM

## 2016-12-12 LAB — FERRITIN: Ferritin: 62 ng/ml (ref 9–269)

## 2016-12-12 LAB — COMPREHENSIVE METABOLIC PANEL
ALT: 10 U/L (ref 0–55)
AST: 19 U/L (ref 5–34)
Albumin: 3.9 g/dL (ref 3.5–5.0)
Alkaline Phosphatase: 65 U/L (ref 40–150)
Anion Gap: 7 mEq/L (ref 3–11)
BUN: 23.4 mg/dL (ref 7.0–26.0)
CO2: 31 mEq/L — ABNORMAL HIGH (ref 22–29)
Calcium: 11.4 mg/dL — ABNORMAL HIGH (ref 8.4–10.4)
Chloride: 100 mEq/L (ref 98–109)
Creatinine: 1.7 mg/dL — ABNORMAL HIGH (ref 0.6–1.1)
EGFR: 28 mL/min/{1.73_m2} — ABNORMAL LOW (ref >90)
Glucose: 149 mg/dl — ABNORMAL HIGH (ref 70–140)
Potassium: 4.6 mEq/L (ref 3.5–5.1)
Sodium: 138 mEq/L (ref 136–145)
Total Bilirubin: 0.36 mg/dL (ref 0.20–1.20)
Total Protein: 6.5 g/dL (ref 6.4–8.3)

## 2016-12-12 LAB — CBC WITH DIFFERENTIAL/PLATELET
BASO%: 0.4 % (ref 0.0–2.0)
Basophils Absolute: 0 10*3/uL (ref 0.0–0.1)
EOS%: 8.7 % — ABNORMAL HIGH (ref 0.0–7.0)
Eosinophils Absolute: 0.4 10*3/uL (ref 0.0–0.5)
HCT: 35.8 % (ref 34.8–46.6)
HGB: 11 g/dL — ABNORMAL LOW (ref 11.6–15.9)
LYMPH%: 32 % (ref 14.0–49.7)
MCH: 25.7 pg (ref 25.1–34.0)
MCHC: 30.7 g/dL — ABNORMAL LOW (ref 31.5–36.0)
MCV: 83.6 fL (ref 79.5–101.0)
MONO#: 0.2 10*3/uL (ref 0.1–0.9)
MONO%: 5 % (ref 0.0–14.0)
NEUT#: 2.6 10*3/uL (ref 1.5–6.5)
NEUT%: 53.9 % (ref 38.4–76.8)
Platelets: 205 10*3/uL (ref 145–400)
RBC: 4.28 10*6/uL (ref 3.70–5.45)
RDW: 14.9 % — ABNORMAL HIGH (ref 11.2–14.5)
WBC: 4.8 10*3/uL (ref 3.9–10.3)
lymph#: 1.6 10*3/uL (ref 0.9–3.3)

## 2016-12-12 LAB — IRON AND TIBC
%SAT: 27 % (ref 21–57)
Iron: 84 ug/dL (ref 41–142)
TIBC: 309 ug/dL (ref 236–444)
UIBC: 225 ug/dL (ref 120–384)

## 2016-12-12 NOTE — Progress Notes (Unsigned)
Patient reported to injection to have aranesp. Patient lab results reviewed and hgb at 11.0 per Dr. Alvy Bimler notes- hold 11.0 or greater. Patient escorted to scheduling to reschedule appt missed today with Dr. Alvy Bimler.

## 2016-12-12 NOTE — Telephone Encounter (Signed)
Spoke with patient regarding her appt on 10/8. Patient missed appt with Dr.Gorsuch today so I rescheduled that.

## 2016-12-13 LAB — ERYTHROPOIETIN: Erythropoietin: 6.2 m[IU]/mL (ref 2.6–18.5)

## 2016-12-13 LAB — VITAMIN B12: Vitamin B12: 167 pg/mL — ABNORMAL LOW (ref 232–1245)

## 2016-12-16 ENCOUNTER — Telehealth: Payer: Self-pay | Admitting: Endocrinology

## 2016-12-16 ENCOUNTER — Other Ambulatory Visit: Payer: Self-pay | Admitting: Hematology and Oncology

## 2016-12-16 DIAGNOSIS — E538 Deficiency of other specified B group vitamins: Secondary | ICD-10-CM

## 2016-12-16 NOTE — Telephone Encounter (Signed)
Spoke with Abigail Hoover he is going to let Mrs. Maclay know I called and she will call back tomorrow

## 2016-12-16 NOTE — Telephone Encounter (Signed)
-----   Message from Elayne Snare, MD sent at 12/16/2016 12:32 PM EDT ----- Regarding: RE: mutual patient Abigail Hoover please schedule this patient for labs and follow-up as soon as possible for evaluation of her calcium, currently does not have an appointment  She apparently has had transient hypercalcemia in the past  ----- Message ----- From: [name], MD Sent: 12/16/2016  10:14 AM To: Elayne Snare, MD, Patton Salles, RN Subject: mutual patient                                 Hi Dr. Dwyane Dee,  She came last week for follow-up on anemia chronic kidney disease. Her calcium level is very high, not sure if it is malignant or related to her calcium/vitamin D/osteoporosis regimen Would you be able to see her soon?  Thanks, Ni

## 2016-12-18 ENCOUNTER — Telehealth: Payer: Self-pay | Admitting: Hematology and Oncology

## 2016-12-18 NOTE — Telephone Encounter (Signed)
Scheduled appt per 9/24 message - patient is aware of appt date and time.

## 2016-12-20 ENCOUNTER — Other Ambulatory Visit (INDEPENDENT_AMBULATORY_CARE_PROVIDER_SITE_OTHER): Payer: Medicare Other

## 2016-12-20 DIAGNOSIS — E119 Type 2 diabetes mellitus without complications: Secondary | ICD-10-CM

## 2016-12-20 DIAGNOSIS — E039 Hypothyroidism, unspecified: Secondary | ICD-10-CM

## 2016-12-20 LAB — LIPID PANEL
Cholesterol: 156 mg/dL (ref 0–200)
HDL: 60.7 mg/dL (ref >39.00)
LDL Cholesterol: 75 mg/dL (ref 0–99)
NonHDL: 95.12
Total CHOL/HDL Ratio: 3
Triglycerides: 102 mg/dL (ref 0.0–149.0)
VLDL: 20.4 mg/dL (ref 0.0–40.0)

## 2016-12-20 LAB — BASIC METABOLIC PANEL
BUN: 29 mg/dL — ABNORMAL HIGH (ref 6–23)
CO2: 30 mEq/L (ref 19–32)
Calcium: 10 mg/dL (ref 8.4–10.5)
Chloride: 98 mEq/L (ref 96–112)
Creatinine, Ser: 1.58 mg/dL — ABNORMAL HIGH (ref 0.40–1.20)
GFR: 34.01 mL/min — ABNORMAL LOW (ref >60.00)
Glucose, Bld: 103 mg/dL — ABNORMAL HIGH (ref 70–99)
Potassium: 4.6 mEq/L (ref 3.5–5.1)
Sodium: 134 mEq/L — ABNORMAL LOW (ref 135–145)

## 2016-12-20 LAB — TSH: TSH: 0.68 u[IU]/mL (ref 0.35–4.50)

## 2016-12-20 LAB — HEMOGLOBIN A1C: Hgb A1c MFr Bld: 6.6 % — ABNORMAL HIGH (ref 4.6–6.5)

## 2016-12-20 LAB — T4, FREE: Free T4: 1.35 ng/dL (ref 0.60–1.60)

## 2016-12-24 ENCOUNTER — Ambulatory Visit (INDEPENDENT_AMBULATORY_CARE_PROVIDER_SITE_OTHER): Payer: Medicare Other | Admitting: Endocrinology

## 2016-12-24 ENCOUNTER — Encounter: Payer: Self-pay | Admitting: Endocrinology

## 2016-12-24 DIAGNOSIS — E063 Autoimmune thyroiditis: Secondary | ICD-10-CM

## 2016-12-24 DIAGNOSIS — Z23 Encounter for immunization: Secondary | ICD-10-CM | POA: Diagnosis not present

## 2016-12-24 DIAGNOSIS — E119 Type 2 diabetes mellitus without complications: Secondary | ICD-10-CM

## 2016-12-24 NOTE — Progress Notes (Signed)
Patient ID: Abigail Hoover, female   DOB: Apr 27, 1945, 71 y.o.   MRN: [id number]    Reason for Appointment:  follow-up of various issues and high calcium  History of Present Illness   Diagnosis: Type 2 DIABETES MELITUS, long-standing  She has had mild diabetes for several years but has required multiple drugs for control Usually has had postprandial hyperglycemia, this has been better controlled with adding Glyset to her main meals Has never been on insulin or a GLP-1 drug Actos had been stopped previously because of tendency to edema  Recent history:   Oral hypoglycemic drugs:  Precose 50 mg before breakfast and dinner, Janumet 50/ 500  Once a day, Glucophage 2x a day   Her A1c is now 6.6 and appears inconsistent  Current management, problems identified and blood sugar patterns:  She did  bring her monitor for download    Her blood sugars are being checked mostly after supper and these are usually near normal with only a couple of readings around her and 15 but otherwise lower  Her weight has, somewhat  She still takes Precose at breakfast and supper and she thinks her lunch meal is very light  She does not do any exercise because of leg pain   Side effects from medications: None Proper timing of medications in relation to meals: Yes.          Monitors blood glucose: 1 times a day or less .    Glucometer: One Touch        Blood Glucose readings from download as above   Meals: 2- 3 meals per day.  eating small breakfast and lunch but very little protein, is vegetarian   Physical activity:  very little walking            Dietician visit: Most recent: Unknown       Complications: are mild symptoms of neuropathy, no nephropathy or retinopathy   Wt Readings from Last 3 Encounters:  12/24/16 105 lb (47.6 kg)  11/13/16 102 lb (46.3 kg)  10/09/16 103 lb 6.4 oz (46.9 kg)   Lab Results  Component Value Date   HGBA1C 6.6 (H) 12/20/2016   HGBA1C 5.0 10/10/2016   HGBA1C 6.9 (H) 04/18/2016   Lab Results  Component Value Date   MICROALBUR 1.0 04/18/2016   LDLCALC 75 12/20/2016   CREATININE 1.58 (H) 12/20/2016      Lab Results  Component Value Date   FRUCTOSAMINE 266 07/26/2016   FRUCTOSAMINE 230 12/07/2014     OTHER problems Addressed today are in review of systems:    Allergies as of 12/24/2016      Reactions   Atorvastatin Rash   Ezetimibe Itching      Medication List       Accurate as of 12/24/16 11:59 PM. Always use your most recent med list.          acarbose 50 MG tablet Commonly known as:  PRECOSE Take 1 tablet at lunch and 11/2 tablet at dinner   aspirin 81 MG EC tablet Take 1 tablet (81 mg total) by mouth 2 (two) times daily with a meal. For 6 weeks   DEXILANT 60 MG capsule Generic drug:  dexlansoprazole   gabapentin 300 MG capsule Commonly known as:  NEURONTIN Take 300 mg by mouth 4 (four) times daily.   glucose blood test strip Commonly known as:  ONETOUCH VERIO Use as instructed   ibandronate 150 MG tablet Commonly known as:  BONIVA  JANUMET 50-500 MG tablet Generic drug:  sitaGLIPtin-metformin TAKE ONE TABLET BY MOUTH TWICE DAILY WITH A MEAL   magnesium oxide 400 MG tablet Commonly known as:  MAG-OX Take 400 mg by mouth 2 (two) times daily.   metoprolol succinate 50 MG 24 hr tablet Commonly known as:  TOPROL-XL Take 1 tablet (50 mg total) by mouth daily. Take with or immediately following a meal.   MYRBETRIQ 50 MG Tb24 tablet Generic drug:  mirabegron ER Take 50 mg by mouth daily.   nitroGLYCERIN 0.4 MG SL tablet Commonly known as:  NITROSTAT Place 1 tablet (0.4 mg total) under the tongue every 5 (five) minutes x 3 doses as needed for chest pain.   ONE TOUCH SURESOFT Misc Use to check blood sugars   onetouch ultrasoft lancets Use as instructed   pentosan polysulfate 100 MG capsule Commonly known as:  ELMIRON Take 100 mg by mouth 3 (three) times daily before meals.   pravastatin 20  MG tablet Commonly known as:  PRAVACHOL TAKE ONE TABLET BY MOUTH ONCE DAILY   SYNTHROID 75 MCG tablet Generic drug:  levothyroxine TAKE ONE TABLET BY MOUTH ONCE DAILY       Allergies:  Allergies  Allergen Reactions  . Atorvastatin Rash  . Ezetimibe Itching    Past Medical History:  Diagnosis Date  . 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 Anxiety   . Arthritis   . Bladder irritation   . Diabetes mellitus   . GERD (gastroesophageal reflux disease)   . Hypercholesterolemia   . Hypertension   . Hypothyroidism   . Iron deficiency anemia, unspecified   . Psoriasis   . Transfusion history 03/05/2016   for postoperative (ORIF) anemia superimposed on chronic anemia    Past Surgical History:  Procedure Laterality Date  . ABDOMINAL HYSTERECTOMY     TAH  . BREAST BIOPSY     Left breast biopsy benign lesion  . BREAST BIOPSY  03/11/2012   Procedure: BREAST BIOPSY;  Surgeon: Odis Hollingshead, MD;  Location: [place];  Service: General;  Laterality: Left;  remove left breast mass  . CARDIAC CATHETERIZATION N/A 01/02/2015   Procedure: Left Heart Cath and Coronary Angiography;  Surgeon: Charolette Forward, MD;  Location: [place] [facility];  Service: Cardiovascular;  Laterality: N/A;  . CATARACT EXTRACTION    . CERVICAL DISC SURGERY    . COLONOSCOPY  2013   neg. next 2023.   Marland Kitchen EYE SURGERY    . LEFT HIP SURGERY    . TOTAL HIP ARTHROPLASTY Left 03/02/2016   Procedure: TOTAL HIP ARTHROPLASTY ANTERIOR APPROACH;  Surgeon: Rod Can, MD;  Location: [place];  Service: Orthopedics;  Laterality: Left;    Family History  Problem Relation Age of Onset  . Heart disease Mother        heart attack  . Stroke Father        brain hemorrhage  . Diabetes Brother   . Cancer Brother   . Breast cancer Sister   . Cancer Sister        breast cancer    Social History:  reports that she has never smoked. She has never used smokeless tobacco. She reports that she does not drink alcohol or use  drugs.  Review of Systems:   She had labs done by her hematologist and this showed a high calcium twice However calcium level is back to normal now She has had previous episodes of transient hypercalcemia of unclear etiology She does not  think she is taking any extra calcium supplements, Tums or vitamin D  HYPERTENSION: Mild, this is well-controlled with metoprolol Only, previously losartan was stopped  Followed by cardiologist  RENAL dysfunction has been variable and followed by nephrologist She has been told to take Lasix regularly now for edema Also she was told to restrict fluid intake  Lab Results  Component Value Date   CREATININE 1.58 (H) 12/20/2016   BUN 29 (H) 12/20/2016   NA 134 (L) 12/20/2016   K 4.6 12/20/2016   CL 98 12/20/2016   CO2 30 12/20/2016     HYPERLIPIDEMIA: The lipid abnormality consists of elevated LDL which is mild and well controlled with pravastatin 20 mg LDL 75 She thinks she had a rash from Lipitor    Lab Results  Component Value Date   CHOL 156 12/20/2016   HDL 60.70 12/20/2016   LDLCALC 75 12/20/2016   TRIG 102.0 12/20/2016   CHOLHDL 3 12/20/2016    She has a long history of primary HYPOTHYROIDISM   She takes Levothyroxine on an empty stomach in the morning Regularly  TSH has been Consistent although low normal now Now taking 75 g   Lab Results  Component Value Date   TSH 0.68 12/20/2016   TSH 1.63 07/26/2016   TSH 2.97 04/18/2016   FREET4 1.35 12/20/2016   FREET4 1.07 04/18/2016   FREET4 1.37 07/13/2015    NEUROPATHY:  No symptoms of numbness In the feet, is on Long-term gabapentin for history of paresthesia, Mostly burning    Diabetic foot exam in : Normal monofilament sensation but absent pulses   OSTEOPENIA: Her T score Previously done by gynecologist was -2.1, currently on Boniva started in 01/2014  Recent bone density was -2.6 in 2/18, this was ordered by her gastroenterologist    Examination:   BP (!) 100/50    Pulse 60   Ht 4\' 11"  (1.499 m)   Wt 105 lb (47.6 kg)   SpO2 95%   BMI 21.21 kg/m   Body mass index is 21.21 kg/m.        ASSESSMENT/ PLAN:   HYPERCALCEMIA: Appears to be resolved and not clear etiology However encouraged patient to keep up with her fluid intake and not restrict excessively especially with her taking Lasix now She also does not need to take any supplemental calcium  OSTEOPOROSIS: This is being managed by her gynecologist and T score was slightly lower in February, report of bone density has been sent to gynecologist  Diabetes type 2, Nonobese    Although her A1c is slightly higher than usual her blood sugars are excellent at home including after meals Her A1c may be dependent on her renal function and anemia status  HYPOTHYROIDISM: TSH is low normal and she can take 6-1/2 tablets a week on her 75 g levothyroxine  LIPIDS: Excellent control   There are no Patient Instructions on file for this visit.  Influenza vaccine given  [facility] 12/25/2016, 8:49 AM

## 2016-12-25 ENCOUNTER — Ambulatory Visit (HOSPITAL_BASED_OUTPATIENT_CLINIC_OR_DEPARTMENT_OTHER): Payer: Medicare Other | Admitting: Hematology and Oncology

## 2016-12-25 ENCOUNTER — Encounter: Payer: Self-pay | Admitting: Hematology and Oncology

## 2016-12-25 ENCOUNTER — Telehealth: Payer: Self-pay | Admitting: Hematology and Oncology

## 2016-12-25 ENCOUNTER — Ambulatory Visit (HOSPITAL_BASED_OUTPATIENT_CLINIC_OR_DEPARTMENT_OTHER): Payer: Medicare Other

## 2016-12-25 DIAGNOSIS — E538 Deficiency of other specified B group vitamins: Secondary | ICD-10-CM

## 2016-12-25 DIAGNOSIS — N183 Chronic kidney disease, stage 3 unspecified: Secondary | ICD-10-CM

## 2016-12-25 DIAGNOSIS — D631 Anemia in chronic kidney disease: Secondary | ICD-10-CM

## 2016-12-25 DIAGNOSIS — N189 Chronic kidney disease, unspecified: Secondary | ICD-10-CM

## 2016-12-25 DIAGNOSIS — I10 Essential (primary) hypertension: Secondary | ICD-10-CM | POA: Diagnosis not present

## 2016-12-25 MED ORDER — CYANOCOBALAMIN 1000 MCG/ML IJ SOLN
INTRAMUSCULAR | Status: AC
  Filled 2016-12-25: qty 1

## 2016-12-25 MED ORDER — CYANOCOBALAMIN 1000 MCG/ML IJ SOLN
1000.0000 ug | Freq: Once | INTRAMUSCULAR | Status: AC
  Administered 2016-12-25: 1000 ug via INTRAMUSCULAR

## 2016-12-25 NOTE — Progress Notes (Signed)
[facility] OFFICE PROGRESS NOTE  [name], MD SUMMARY OF HEMATOLOGIC HISTORY:  This is a patient that has been followed here since 2011 for chronic anemia In 2011, she had a bone marrow aspirate and biopsy that came back normal. The patient has been receiving Aranesp injection for chronic anemia up until 2014. It was subsequently discontinued due stability of the anemia. Starting 05/27/2014, darbepoetin was resumed at 200 g every other month to keep hemoglobin greater than 10 g. Treatment was discontinued in October 2016 and resumed in September 2017 The patient is subsequently found to have severe vitamin B12 deficiency and is started on vitamin B12 injection on December 25, 2016 INTERVAL HISTORY: Abigail Hoover 71 y.o. female returns for further follow-up. She feels well. No recent infection. She has her kidney function tests rechecked recently and her serum calcium were within normal limits. The patient denies any recent signs or symptoms of bleeding such as spontaneous epistaxis, hematuria or hematochezia.   I have reviewed the past medical history, past surgical history, social history and family history with the patient and they are unchanged from previous note.  ALLERGIES:  is allergic to atorvastatin and ezetimibe.  MEDICATIONS:  Current Outpatient Prescriptions  Medication Sig Dispense Refill  . acarbose (PRECOSE) 50 MG tablet Take 1 tablet at lunch and 11/2 tablet at dinner 75 tablet 4  . aspirin 81 MG EC tablet Take 1 tablet (81 mg total) by mouth 2 (two) times daily with a meal. For 6 weeks    . DEXILANT 60 MG capsule     . gabapentin (NEURONTIN) 300 MG capsule Take 300 mg by mouth 4 (four) times daily.    Marland Kitchen glucose blood (ONETOUCH VERIO) test strip Use as instructed 100 each 12  . ibandronate (BONIVA) 150 MG tablet     . JANUMET 50-500 MG tablet TAKE ONE TABLET BY MOUTH TWICE DAILY WITH A MEAL 180 tablet 2  . Lancets (ONETOUCH ULTRASOFT) lancets Use as  instructed 100 each 12  . Lancets Misc. (ONE TOUCH SURESOFT) MISC Use to check blood sugars 1 each 3  . magnesium oxide (MAG-OX) 400 MG tablet Take 400 mg by mouth 2 (two) times daily.    . metoprolol succinate (TOPROL-XL) 50 MG 24 hr tablet Take 1 tablet (50 mg total) by mouth daily. Take with or immediately following a meal. 30 tablet 3  . MYRBETRIQ 50 MG TB24 tablet Take 50 mg by mouth daily.  3  . nitroGLYCERIN (NITROSTAT) 0.4 MG SL tablet Place 1 tablet (0.4 mg total) under the tongue every 5 (five) minutes x 3 doses as needed for chest pain. 25 tablet 12  . pentosan polysulfate (ELMIRON) 100 MG capsule Take 100 mg by mouth 3 (three) times daily before meals.     . pravastatin (PRAVACHOL) 20 MG tablet TAKE ONE TABLET BY MOUTH ONCE DAILY 90 tablet 1  . SYNTHROID 75 MCG tablet TAKE ONE TABLET BY MOUTH ONCE DAILY 90 tablet 1   No current facility-administered medications for this visit.      REVIEW OF SYSTEMS:   Constitutional: Denies fevers, chills or night sweats Eyes: Denies blurriness of vision Ears, nose, mouth, throat, and face: Denies mucositis or sore throat Respiratory: Denies cough, dyspnea or wheezes Cardiovascular: Denies palpitation, chest discomfort or lower extremity swelling Gastrointestinal:  Denies nausea, heartburn or change in bowel habits Skin: Denies abnormal skin rashes Lymphatics: Denies new lymphadenopathy or easy bruising Neurological:Denies numbness, tingling or new weaknesses Behavioral/Psych: Mood is stable, no  new changes  All other systems were reviewed with the patient and are negative.  PHYSICAL EXAMINATION: ECOG PERFORMANCE STATUS: 1 - Symptomatic but completely ambulatory  Vitals:   12/25/16 1036  BP: (!) 152/65  Pulse: (!) 56  Resp: 17  Temp: 97.8 F (36.6 C)  SpO2: 100%   Filed Weights   12/25/16 1036  Weight: 103 lb 9.6 oz (47 kg)    GENERAL:alert, no distress and comfortable SKIN: skin color, texture, turgor are normal, no rashes or  significant lesions EYES: normal, Conjunctiva are pink and non-injected, sclera clear Musculoskeletal:no cyanosis of digits and no clubbing  NEURO: alert & oriented x 3 with fluent speech, no focal motor/sensory deficits  LABORATORY DATA:  I have reviewed the data as listed     Component Value Date/Time   NA 134 (L) 12/20/2016 1342   NA 138 12/12/2016 1150   K 4.6 12/20/2016 1342   K 4.6 12/12/2016 1150   CL 98 12/20/2016 1342   CL 103 07/27/2012 1259   CO2 30 12/20/2016 1342   CO2 31 (H) 12/12/2016 1150   GLUCOSE 103 (H) 12/20/2016 1342   GLUCOSE 149 (H) 12/12/2016 1150   GLUCOSE 118 (H) 07/27/2012 1259   BUN 29 (H) 12/20/2016 1342   BUN 23.4 12/12/2016 1150   CREATININE 1.58 (H) 12/20/2016 1342   CREATININE 1.7 (H) 12/12/2016 1150   CALCIUM 10.0 12/20/2016 1342   CALCIUM 11.4 (H) 12/12/2016 1150   PROT 6.5 12/12/2016 1150   ALBUMIN 3.9 12/12/2016 1150   AST 19 12/12/2016 1150   ALT 10 12/12/2016 1150   ALKPHOS 65 12/12/2016 1150   BILITOT 0.36 12/12/2016 1150   GFRNONAA 52 (L) 03/04/2016 0528   GFRAA >60 03/04/2016 0528    No results found for: SPEP, UPEP  Lab Results  Component Value Date   WBC 4.8 12/12/2016   NEUTROABS 2.6 12/12/2016   HGB 11.0 (L) 12/12/2016   HCT 35.8 12/12/2016   MCV 83.6 12/12/2016   PLT 205 12/12/2016      Chemistry      Component Value Date/Time   NA 134 (L) 12/20/2016 1342   NA 138 12/12/2016 1150   K 4.6 12/20/2016 1342   K 4.6 12/12/2016 1150   CL 98 12/20/2016 1342   CL 103 07/27/2012 1259   CO2 30 12/20/2016 1342   CO2 31 (H) 12/12/2016 1150   BUN 29 (H) 12/20/2016 1342   BUN 23.4 12/12/2016 1150   CREATININE 1.58 (H) 12/20/2016 1342   CREATININE 1.7 (H) 12/12/2016 1150      Component Value Date/Time   CALCIUM 10.0 12/20/2016 1342   CALCIUM 11.4 (H) 12/12/2016 1150   ALKPHOS 65 12/12/2016 1150   AST 19 12/12/2016 1150   ALT 10 12/12/2016 1150   BILITOT 0.36 12/12/2016 1150     ASSESSMENT & PLAN:  Vitamin B 12  deficiency Recent blood work revealed significant iron deficiency anemia The patient is a vegetarian and I think that is likely dietary related I recommend B12 injection once a week for 4 weeks After that, I recommend high-dose oral vitamin B12 replacement therapy I plan to recheck serum vitamin B12 again next year After adequate vitamin B12 replacements, we will reassess to see if the patient would still need ESA   Chronic kidney disease, stage III (moderate) This is likely related to aging, chronic type 2 diabetes and hypertension. Continue risk factor modification.  Essential hypertension Her blood pressure is a little high today, which could be attributed  by whitecoat hypertension she will continue current medical management. I recommend close follow-up with primary care doctor for medication adjustment.   No orders of the defined types were placed in this encounter.   All questions were answered. The patient knows to call the clinic with any problems, questions or concerns. No barriers to learning was detected.  I spent 10 minutes counseling the patient face to face. The total time spent in the appointment was 15 minutes and more than 50% was on counseling.     [name], MD 10/3/20183:44 PM

## 2016-12-25 NOTE — Assessment & Plan Note (Signed)
This is likely related to aging, chronic type 2 diabetes and hypertension. Continue risk factor modification.

## 2016-12-25 NOTE — Patient Instructions (Signed)
Cyanocobalamin, Vitamin B12 injection What is this medicine? CYANOCOBALAMIN (sye an oh koe BAL a min) is a man made form of vitamin B12. Vitamin B12 is used in the growth of healthy blood cells, nerve cells, and proteins in the body. It also helps with the metabolism of fats and carbohydrates. This medicine is used to treat people who can not absorb vitamin B12. This medicine may be used for other purposes; ask your health care provider or pharmacist if you have questions. COMMON BRAND NAME(S): B-12 Compliance Kit, B-12 Injection Kit, Cyomin, LA-12, Nutri-Twelve, Physicians EZ Use B-12, Primabalt What should I tell my health care provider before I take this medicine? They need to know if you have any of these conditions: -kidney disease -Leber's disease -megaloblastic anemia -an unusual or allergic reaction to cyanocobalamin, cobalt, other medicines, foods, dyes, or preservatives -pregnant or trying to get pregnant -breast-feeding How should I use this medicine? This medicine is injected into a muscle or deeply under the skin. It is usually given by a health care professional in a clinic or doctor's office. However, your doctor may teach you how to inject yourself. Follow all instructions. Talk to your pediatrician regarding the use of this medicine in children. Special care may be needed. Overdosage: If you think you have taken too much of this medicine contact a poison control center or emergency room at once. NOTE: This medicine is only for you. Do not share this medicine with others. What if I miss a dose? If you are given your dose at a clinic or doctor's office, call to reschedule your appointment. If you give your own injections and you miss a dose, take it as soon as you can. If it is almost time for your next dose, take only that dose. Do not take double or extra doses. What may interact with this medicine? -colchicine -heavy alcohol intake This list may not describe all possible  interactions. Give your health care provider a list of all the medicines, herbs, non-prescription drugs, or dietary supplements you use. Also tell them if you smoke, drink alcohol, or use illegal drugs. Some items may interact with your medicine. What should I watch for while using this medicine? Visit your doctor or health care professional regularly. You may need blood work done while you are taking this medicine. You may need to follow a special diet. Talk to your doctor. Limit your alcohol intake and avoid smoking to get the best benefit. What side effects may I notice from receiving this medicine? Side effects that you should report to your doctor or health care professional as soon as possible: -allergic reactions like skin rash, itching or hives, swelling of the face, lips, or tongue -blue tint to skin -chest tightness, pain -difficulty breathing, wheezing -dizziness -red, swollen painful area on the leg Side effects that usually do not require medical attention (report to your doctor or health care professional if they continue or are bothersome): -diarrhea -headache This list may not describe all possible side effects. Call your doctor for medical advice about side effects. You may report side effects to FDA at 1-800-FDA-1088. Where should I keep my medicine? Keep out of the reach of children. Store at room temperature between 15 and 30 degrees C (59 and 85 degrees F). Protect from light. Throw away any unused medicine after the expiration date. NOTE: This sheet is a summary. It may not cover all possible information. If you have questions about this medicine, talk to your doctor, pharmacist, or   health care provider.  2018 Elsevier/Gold Standard (2007-06-22 22:10:20)  

## 2016-12-25 NOTE — Telephone Encounter (Signed)
Scheduled appt per 10/3 los - Gave patient AVS and calender per los.  

## 2016-12-25 NOTE — Assessment & Plan Note (Signed)
Her blood pressure is a little high today, which could be attributed by whitecoat hypertension she will continue current medical management. I recommend close follow-up with primary care doctor for medication adjustment.

## 2016-12-25 NOTE — Assessment & Plan Note (Signed)
Recent blood work revealed significant iron deficiency anemia The patient is a vegetarian and I think that is likely dietary related I recommend B12 injection once a week for 4 weeks After that, I recommend high-dose oral vitamin B12 replacement therapy I plan to recheck serum vitamin B12 again next year After adequate vitamin B12 replacements, we will reassess to see if the patient would still need ESA

## 2016-12-27 DIAGNOSIS — B373 Candidiasis of vulva and vagina: Secondary | ICD-10-CM | POA: Diagnosis not present

## 2016-12-27 DIAGNOSIS — N952 Postmenopausal atrophic vaginitis: Secondary | ICD-10-CM | POA: Diagnosis not present

## 2016-12-27 DIAGNOSIS — L298 Other pruritus: Secondary | ICD-10-CM | POA: Diagnosis not present

## 2016-12-30 ENCOUNTER — Ambulatory Visit: Payer: Medicare Other | Admitting: Physical Therapy

## 2016-12-30 ENCOUNTER — Ambulatory Visit: Payer: Medicare Other | Admitting: Hematology and Oncology

## 2017-01-01 ENCOUNTER — Other Ambulatory Visit: Payer: Self-pay | Admitting: General Surgery

## 2017-01-01 ENCOUNTER — Encounter: Payer: Medicare Other | Admitting: Physical Therapy

## 2017-01-01 ENCOUNTER — Ambulatory Visit (HOSPITAL_BASED_OUTPATIENT_CLINIC_OR_DEPARTMENT_OTHER): Payer: Medicare Other

## 2017-01-01 VITALS — BP 150/87 | HR 70 | Temp 97.0°F | Resp 18

## 2017-01-01 DIAGNOSIS — N183 Chronic kidney disease, stage 3 (moderate): Secondary | ICD-10-CM

## 2017-01-01 DIAGNOSIS — L309 Dermatitis, unspecified: Secondary | ICD-10-CM | POA: Diagnosis not present

## 2017-01-01 DIAGNOSIS — Z9189 Other specified personal risk factors, not elsewhere classified: Secondary | ICD-10-CM | POA: Diagnosis not present

## 2017-01-01 DIAGNOSIS — N189 Chronic kidney disease, unspecified: Secondary | ICD-10-CM

## 2017-01-01 DIAGNOSIS — D631 Anemia in chronic kidney disease: Secondary | ICD-10-CM

## 2017-01-01 DIAGNOSIS — N6459 Other signs and symptoms in breast: Secondary | ICD-10-CM | POA: Diagnosis not present

## 2017-01-01 DIAGNOSIS — E538 Deficiency of other specified B group vitamins: Secondary | ICD-10-CM

## 2017-01-01 MED ORDER — CYANOCOBALAMIN 1000 MCG/ML IJ SOLN
1000.0000 ug | Freq: Once | INTRAMUSCULAR | Status: AC
  Administered 2017-01-01: 1000 ug via INTRAMUSCULAR

## 2017-01-01 NOTE — Patient Instructions (Signed)
Cyanocobalamin, Vitamin B12 injection What is this medicine? CYANOCOBALAMIN (sye an oh koe BAL a min) is a man made form of vitamin B12. Vitamin B12 is used in the growth of healthy blood cells, nerve cells, and proteins in the body. It also helps with the metabolism of fats and carbohydrates. This medicine is used to treat people who can not absorb vitamin B12. This medicine may be used for other purposes; ask your health care provider or pharmacist if you have questions. COMMON BRAND NAME(S): B-12 Compliance Kit, B-12 Injection Kit, Cyomin, LA-12, Nutri-Twelve, Physicians EZ Use B-12, Primabalt What should I tell my health care provider before I take this medicine? They need to know if you have any of these conditions: -kidney disease -Leber's disease -megaloblastic anemia -an unusual or allergic reaction to cyanocobalamin, cobalt, other medicines, foods, dyes, or preservatives -pregnant or trying to get pregnant -breast-feeding How should I use this medicine? This medicine is injected into a muscle or deeply under the skin. It is usually given by a health care professional in a clinic or doctor's office. However, your doctor may teach you how to inject yourself. Follow all instructions. Talk to your pediatrician regarding the use of this medicine in children. Special care may be needed. Overdosage: If you think you have taken too much of this medicine contact a poison control center or emergency room at once. NOTE: This medicine is only for you. Do not share this medicine with others. What if I miss a dose? If you are given your dose at a clinic or doctor's office, call to reschedule your appointment. If you give your own injections and you miss a dose, take it as soon as you can. If it is almost time for your next dose, take only that dose. Do not take double or extra doses. What may interact with this medicine? -colchicine -heavy alcohol intake This list may not describe all possible  interactions. Give your health care provider a list of all the medicines, herbs, non-prescription drugs, or dietary supplements you use. Also tell them if you smoke, drink alcohol, or use illegal drugs. Some items may interact with your medicine. What should I watch for while using this medicine? Visit your doctor or health care professional regularly. You may need blood work done while you are taking this medicine. You may need to follow a special diet. Talk to your doctor. Limit your alcohol intake and avoid smoking to get the best benefit. What side effects may I notice from receiving this medicine? Side effects that you should report to your doctor or health care professional as soon as possible: -allergic reactions like skin rash, itching or hives, swelling of the face, lips, or tongue -blue tint to skin -chest tightness, pain -difficulty breathing, wheezing -dizziness -red, swollen painful area on the leg Side effects that usually do not require medical attention (report to your doctor or health care professional if they continue or are bothersome): -diarrhea -headache This list may not describe all possible side effects. Call your doctor for medical advice about side effects. You may report side effects to FDA at 1-800-FDA-1088. Where should I keep my medicine? Keep out of the reach of children. Store at room temperature between 15 and 30 degrees C (59 and 85 degrees F). Protect from light. Throw away any unused medicine after the expiration date. NOTE: This sheet is a summary. It may not cover all possible information. If you have questions about this medicine, talk to your doctor, pharmacist, or   health care provider.  2018 Elsevier/Gold Standard (2007-06-22 22:10:20)  

## 2017-01-02 DIAGNOSIS — E1122 Type 2 diabetes mellitus with diabetic chronic kidney disease: Secondary | ICD-10-CM | POA: Diagnosis not present

## 2017-01-02 DIAGNOSIS — E039 Hypothyroidism, unspecified: Secondary | ICD-10-CM | POA: Diagnosis not present

## 2017-01-02 DIAGNOSIS — I129 Hypertensive chronic kidney disease with stage 1 through stage 4 chronic kidney disease, or unspecified chronic kidney disease: Secondary | ICD-10-CM | POA: Diagnosis not present

## 2017-01-02 DIAGNOSIS — I252 Old myocardial infarction: Secondary | ICD-10-CM | POA: Diagnosis not present

## 2017-01-02 DIAGNOSIS — E785 Hyperlipidemia, unspecified: Secondary | ICD-10-CM | POA: Diagnosis not present

## 2017-01-02 DIAGNOSIS — D649 Anemia, unspecified: Secondary | ICD-10-CM | POA: Diagnosis not present

## 2017-01-02 DIAGNOSIS — N189 Chronic kidney disease, unspecified: Secondary | ICD-10-CM | POA: Diagnosis not present

## 2017-01-02 DIAGNOSIS — E559 Vitamin D deficiency, unspecified: Secondary | ICD-10-CM | POA: Diagnosis not present

## 2017-01-02 DIAGNOSIS — R0789 Other chest pain: Secondary | ICD-10-CM | POA: Diagnosis not present

## 2017-01-02 DIAGNOSIS — I25111 Atherosclerotic heart disease of native coronary artery with angina pectoris with documented spasm: Secondary | ICD-10-CM | POA: Diagnosis not present

## 2017-01-07 ENCOUNTER — Ambulatory Visit: Payer: Medicare Other | Attending: Internal Medicine | Admitting: Physical Therapy

## 2017-01-07 ENCOUNTER — Encounter: Payer: Self-pay | Admitting: Physical Therapy

## 2017-01-07 DIAGNOSIS — M25552 Pain in left hip: Secondary | ICD-10-CM | POA: Insufficient documentation

## 2017-01-07 DIAGNOSIS — R2689 Other abnormalities of gait and mobility: Secondary | ICD-10-CM | POA: Insufficient documentation

## 2017-01-07 DIAGNOSIS — M25652 Stiffness of left hip, not elsewhere classified: Secondary | ICD-10-CM | POA: Insufficient documentation

## 2017-01-07 NOTE — Therapy (Signed)
[facility] [place], [place], [address] Phone: [phone]   Fax:  [phone]  Physical Therapy Treatment/Renewal  Patient Details  Name: Abigail Hoover MRN: [id number] Date of Birth: [birth year] Referring Provider: Dr. Rod Can  Encounter Date: 01/07/2017      PT End of Session - 01/07/17 1417    Visit Number 6   Number of Visits 18   Date for PT Re-Evaluation 02/18/17   Authorization Type Medicare/BCBS - G codes and Progress Notes   PT Start Time 1332   PT Stop Time 1425   PT Time Calculation (min) 53 min   Activity Tolerance Patient limited by fatigue   Behavior During Therapy [facility] for tasks assessed/performed      Past Medical History:  Diagnosis Date  . 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 Anxiety   . Arthritis   . Bladder irritation   . Diabetes mellitus   . GERD (gastroesophageal reflux disease)   . Hypercholesterolemia   . Hypertension   . Hypothyroidism   . Iron deficiency anemia, unspecified   . Psoriasis   . Transfusion history 03/05/2016   for postoperative (ORIF) anemia superimposed on chronic anemia    Past Surgical History:  Procedure Laterality Date  . ABDOMINAL HYSTERECTOMY     TAH  . BREAST BIOPSY     Left breast biopsy benign lesion  . BREAST BIOPSY  03/11/2012   Procedure: BREAST BIOPSY;  Surgeon: Odis Hollingshead, MD;  Location: [facility];  Service: General;  Laterality: Left;  remove left breast mass  . CARDIAC CATHETERIZATION N/A 01/02/2015   Procedure: Left Heart Cath and Coronary Angiography;  Surgeon: Charolette Forward, MD;  Location: [facility];  Service: Cardiovascular;  Laterality: N/A;  . CATARACT EXTRACTION    . CERVICAL DISC SURGERY    . COLONOSCOPY  2013   neg. next 2023.   Marland Kitchen EYE SURGERY    . LEFT HIP SURGERY    . TOTAL HIP ARTHROPLASTY Left 03/02/2016   Procedure: TOTAL HIP ARTHROPLASTY ANTERIOR APPROACH;  Surgeon: Rod Can, MD;  Location:  [facility];  Service: Orthopedics;  Laterality: Left;    There were no vitals filed for this visit.      Subjective Assessment - 01/07/17 1340    Subjective Pt presents for re-evaluation of L hip pain.  She reports medical issues which have prevented her from attending regularly in addition to difficulty with transportation. She has pain    How long can you walk comfortably? standing 1 hour is OK cooks but takes breaks    Currently in Pain? Yes   Pain Score 5    Pain Location Hip   Pain Orientation Left   Pain Descriptors / Indicators Burning;Aching   Pain Type Chronic pain   Pain Onset More than a month ago   Pain Frequency Intermittent   Aggravating Factors  transitions   Pain Relieving Factors changing positons, no medicine             [facility] PT Assessment - 01/07/17 0001      Assessment   Onset Date/Surgical Date 03/02/16     Strength   Right Hip Flexion 3/5   Left Hip Flexion 3/5   Left Hip External Rotation 3/5   Left Hip Internal Rotation 3/5   Left Hip ABduction 2+/5   Right Knee Flexion 4/5   Right Knee Extension 4/5   Left Knee Flexion 3+/5   Left Knee Extension 4-/5  [facility] Adult PT Treatment/Exercise - 2017-01-21 0001      Self-Care   Self-Care Heat/Ice Application;Other Self-Care Comments   Other Self-Care Comments  POC, HEP, cont PT and consistency      Knee/Hip Exercises: Stretches   Active Hamstring Stretch Left;2 reps;30 seconds   Hip Flexor Stretch 2 reps;30 seconds  verbal cues for confidence for stretch due to pt fear fallin   ITB Stretch Left;2 reps;30 seconds   Piriformis Stretch Both;2 reps;30 seconds   Piriformis Stretch Limitations knee to opposite shoulder     Knee/Hip Exercises: Aerobic   Nustep L5 x 8 min UE/LE     Knee/Hip Exercises: Supine   Bridges Strengthening;Both;1 set;10 reps     Knee/Hip Exercises: Sidelying   Clams x 10      Modalities   Modalities Moist Heat     Moist Heat Therapy    Number Minutes Moist Heat 10 Minutes   Moist Heat Location Hip  left                PT Education - 01-21-17 1356    Education provided Yes   Education Details HEP review, renewal and need to do HEP daily.    Person(s) Educated Patient   Methods Explanation;Handout   Comprehension Verbalized understanding             PT Long Term Goals - 21-Jan-2017 1409      PT LONG TERM GOAL #1   Title independent with HEP   Status On-going     PT LONG TERM GOAL #2   Title demonstrate 4/5 strength Lt hip for improved functional mobility   Status On-going     PT LONG TERM GOAL #3   Title report ability to walk > 30 min with min increase in pain for improved function   Status On-going     PT LONG TERM GOAL #4   Title report pain < 3/10 with standing 1 hour to cook   Status Revised               Plan - 2017/01/21 1421    Clinical Impression Statement Pt returns on the suggestion of her MD as she complained to him that she was still having pain. She has also had other medical issues which contributes to her fatigue and lack of strength.  She will benefit from continued skilled PT to improve her ability to tolerate standing, cooking and ADLs.     Clinical Presentation Stable   Clinical Decision Making Low   Rehab Potential Good   PT Frequency 2x / week   PT Duration 6 weeks   PT Treatment/Interventions ADLs/Self Care Home Management;Cryotherapy;Electrical Stimulation;Moist Heat;Ultrasound;Neuromuscular re-education;Balance training;Therapeutic exercise;Iontophoresis 4mg /ml Dexamethasone;Therapeutic activities;Functional mobility training;Gait training;Patient/family education;Manual techniques;Passive range of motion;Taping;Dry needling   PT Next Visit Plan review HEP, manual to TFL, glut med strengthening; pt with poor tolerance to prone so limit prone activities   PT Home Exercise Plan sit to stand, ITB and piriformis, bridging, clam    Consulted and Agree with Plan of Care  Patient      Patient will benefit from skilled therapeutic intervention in order to improve the following deficits and impairments:  Abnormal gait, Impaired flexibility, Decreased range of motion, Difficulty walking, Decreased mobility, Decreased strength, Increased fascial restricitons, Increased muscle spasms, Pain  Visit Diagnosis: Pain in left hip  Stiffness of left hip, not elsewhere classified  Other abnormalities of gait and mobility       G-Codes - 01-21-17 1413  Functional Assessment Tool Used (Outpatient Only) clinical judgement   Functional Limitation Mobility: Walking and moving around   Mobility: Walking and Moving Around Current Status [phone]) At least 20 percent but less than 40 percent impaired, limited or restricted   Mobility: Walking and Moving Around Goal Status [phone]) At least 1 percent but less than 20 percent impaired, limited or restricted      Problem List Patient Active Problem List   Diagnosis Date Noted  . Hypercalcemia 12/16/2016  . Vitamin B 12 deficiency 12/16/2016  . Displaced fracture of left femoral neck ([place]) 03/02/2016  . Hyperkalemia 03/01/2016  . Femoral neck fracture ([place]) 02/29/2016  . Anemia due to stage 3 (moderate) chronic renal failure treated with erythropoietin ([place]) 12/18/2015  . Chronic kidney disease, stage III (moderate) ([place]) 08/14/2015  . 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 Acute coronary syndrome ([place]) 01/02/2015  . Deficiency anemia 05/27/2014  . Carpal tunnel syndrome 12/09/2013  . Diabetic neuropathy ([place]) 12/09/2013  . Type 2 diabetes mellitus with neurological manifestations, controlled ([place]) 04/21/2013  . Disturbance of skin sensation 04/21/2013  . Hypothyroidism 10/30/2012  . Breast mass in female-left 03/10/2012  . Weight loss 11/28/2011  . Family history of breast cancer 08/01/2010  . Essential hypertension 02/01/2009  . EDEMA LEGS 02/01/2009    Abigail Hoover 01/07/2017, 2:30 PM  [facility] [address] [place], [place], [address] Phone: [phone]   Fax:  [phone]  Name: Abigail Hoover MRN: [id number] Date of Birth: 1946-02-18  Raeford Razor, PT 01/07/17 2:30 PM Phone: [phone] Fax: [phone]

## 2017-01-08 ENCOUNTER — Ambulatory Visit (HOSPITAL_BASED_OUTPATIENT_CLINIC_OR_DEPARTMENT_OTHER): Payer: Medicare Other

## 2017-01-08 VITALS — BP 126/60 | HR 59 | Temp 97.5°F | Resp 18

## 2017-01-08 DIAGNOSIS — N189 Chronic kidney disease, unspecified: Secondary | ICD-10-CM

## 2017-01-08 DIAGNOSIS — N183 Chronic kidney disease, stage 3 unspecified: Secondary | ICD-10-CM

## 2017-01-08 DIAGNOSIS — E538 Deficiency of other specified B group vitamins: Secondary | ICD-10-CM

## 2017-01-08 DIAGNOSIS — D631 Anemia in chronic kidney disease: Secondary | ICD-10-CM

## 2017-01-08 MED ORDER — CYANOCOBALAMIN 1000 MCG/ML IJ SOLN
1000.0000 ug | Freq: Once | INTRAMUSCULAR | Status: AC
  Administered 2017-01-08: 1000 ug via INTRAMUSCULAR

## 2017-01-08 NOTE — Patient Instructions (Signed)
Cyanocobalamin, Vitamin B12 injection What is this medicine? CYANOCOBALAMIN (sye an oh koe BAL a min) is a man made form of vitamin B12. Vitamin B12 is used in the growth of healthy blood cells, nerve cells, and proteins in the body. It also helps with the metabolism of fats and carbohydrates. This medicine is used to treat people who can not absorb vitamin B12. This medicine may be used for other purposes; ask your health care provider or pharmacist if you have questions. COMMON BRAND NAME(S): B-12 Compliance Kit, B-12 Injection Kit, Cyomin, LA-12, Nutri-Twelve, Physicians EZ Use B-12, Primabalt What should I tell my health care provider before I take this medicine? They need to know if you have any of these conditions: -kidney disease -Leber's disease -megaloblastic anemia -an unusual or allergic reaction to cyanocobalamin, cobalt, other medicines, foods, dyes, or preservatives -pregnant or trying to get pregnant -breast-feeding How should I use this medicine? This medicine is injected into a muscle or deeply under the skin. It is usually given by a health care professional in a clinic or doctor's office. However, your doctor may teach you how to inject yourself. Follow all instructions. Talk to your pediatrician regarding the use of this medicine in children. Special care may be needed. Overdosage: If you think you have taken too much of this medicine contact a poison control center or emergency room at once. NOTE: This medicine is only for you. Do not share this medicine with others. What if I miss a dose? If you are given your dose at a clinic or doctor's office, call to reschedule your appointment. If you give your own injections and you miss a dose, take it as soon as you can. If it is almost time for your next dose, take only that dose. Do not take double or extra doses. What may interact with this medicine? -colchicine -heavy alcohol intake This list may not describe all possible  interactions. Give your health care provider a list of all the medicines, herbs, non-prescription drugs, or dietary supplements you use. Also tell them if you smoke, drink alcohol, or use illegal drugs. Some items may interact with your medicine. What should I watch for while using this medicine? Visit your doctor or health care professional regularly. You may need blood work done while you are taking this medicine. You may need to follow a special diet. Talk to your doctor. Limit your alcohol intake and avoid smoking to get the best benefit. What side effects may I notice from receiving this medicine? Side effects that you should report to your doctor or health care professional as soon as possible: -allergic reactions like skin rash, itching or hives, swelling of the face, lips, or tongue -blue tint to skin -chest tightness, pain -difficulty breathing, wheezing -dizziness -red, swollen painful area on the leg Side effects that usually do not require medical attention (report to your doctor or health care professional if they continue or are bothersome): -diarrhea -headache This list may not describe all possible side effects. Call your doctor for medical advice about side effects. You may report side effects to FDA at 1-800-FDA-1088. Where should I keep my medicine? Keep out of the reach of children. Store at room temperature between 15 and 30 degrees C (59 and 85 degrees F). Protect from light. Throw away any unused medicine after the expiration date. NOTE: This sheet is a summary. It may not cover all possible information. If you have questions about this medicine, talk to your doctor, pharmacist, or   health care provider.  2018 Elsevier/Gold Standard (2007-06-22 22:10:20)  

## 2017-01-09 DIAGNOSIS — L309 Dermatitis, unspecified: Secondary | ICD-10-CM | POA: Diagnosis not present

## 2017-01-09 DIAGNOSIS — L819 Disorder of pigmentation, unspecified: Secondary | ICD-10-CM | POA: Diagnosis not present

## 2017-01-13 ENCOUNTER — Other Ambulatory Visit: Payer: Self-pay | Admitting: Endocrinology

## 2017-01-15 ENCOUNTER — Ambulatory Visit (HOSPITAL_BASED_OUTPATIENT_CLINIC_OR_DEPARTMENT_OTHER): Payer: Medicare Other

## 2017-01-15 VITALS — BP 137/63 | HR 68 | Temp 97.8°F | Resp 20

## 2017-01-15 DIAGNOSIS — N183 Chronic kidney disease, stage 3 unspecified: Secondary | ICD-10-CM

## 2017-01-15 DIAGNOSIS — E538 Deficiency of other specified B group vitamins: Secondary | ICD-10-CM | POA: Diagnosis present

## 2017-01-15 DIAGNOSIS — D631 Anemia in chronic kidney disease: Secondary | ICD-10-CM

## 2017-01-15 DIAGNOSIS — N189 Chronic kidney disease, unspecified: Secondary | ICD-10-CM

## 2017-01-15 MED ORDER — CYANOCOBALAMIN 1000 MCG/ML IJ SOLN
1000.0000 ug | Freq: Once | INTRAMUSCULAR | Status: AC
  Administered 2017-01-15: 1000 ug via INTRAMUSCULAR

## 2017-01-15 NOTE — Patient Instructions (Signed)
Cyanocobalamin, Vitamin B12 injection What is this medicine? CYANOCOBALAMIN (sye an oh koe BAL a min) is a man made form of vitamin B12. Vitamin B12 is used in the growth of healthy blood cells, nerve cells, and proteins in the body. It also helps with the metabolism of fats and carbohydrates. This medicine is used to treat people who can not absorb vitamin B12. This medicine may be used for other purposes; ask your health care provider or pharmacist if you have questions. COMMON BRAND NAME(S): B-12 Compliance Kit, B-12 Injection Kit, Cyomin, LA-12, Nutri-Twelve, Physicians EZ Use B-12, Primabalt What should I tell my health care provider before I take this medicine? They need to know if you have any of these conditions: -kidney disease -Leber's disease -megaloblastic anemia -an unusual or allergic reaction to cyanocobalamin, cobalt, other medicines, foods, dyes, or preservatives -pregnant or trying to get pregnant -breast-feeding How should I use this medicine? This medicine is injected into a muscle or deeply under the skin. It is usually given by a health care professional in a clinic or doctor's office. However, your doctor may teach you how to inject yourself. Follow all instructions. Talk to your pediatrician regarding the use of this medicine in children. Special care may be needed. Overdosage: If you think you have taken too much of this medicine contact a poison control center or emergency room at once. NOTE: This medicine is only for you. Do not share this medicine with others. What if I miss a dose? If you are given your dose at a clinic or doctor's office, call to reschedule your appointment. If you give your own injections and you miss a dose, take it as soon as you can. If it is almost time for your next dose, take only that dose. Do not take double or extra doses. What may interact with this medicine? -colchicine -heavy alcohol intake This list may not describe all possible  interactions. Give your health care provider a list of all the medicines, herbs, non-prescription drugs, or dietary supplements you use. Also tell them if you smoke, drink alcohol, or use illegal drugs. Some items may interact with your medicine. What should I watch for while using this medicine? Visit your doctor or health care professional regularly. You may need blood work done while you are taking this medicine. You may need to follow a special diet. Talk to your doctor. Limit your alcohol intake and avoid smoking to get the best benefit. What side effects may I notice from receiving this medicine? Side effects that you should report to your doctor or health care professional as soon as possible: -allergic reactions like skin rash, itching or hives, swelling of the face, lips, or tongue -blue tint to skin -chest tightness, pain -difficulty breathing, wheezing -dizziness -red, swollen painful area on the leg Side effects that usually do not require medical attention (report to your doctor or health care professional if they continue or are bothersome): -diarrhea -headache This list may not describe all possible side effects. Call your doctor for medical advice about side effects. You may report side effects to FDA at 1-800-FDA-1088. Where should I keep my medicine? Keep out of the reach of children. Store at room temperature between 15 and 30 degrees C (59 and 85 degrees F). Protect from light. Throw away any unused medicine after the expiration date. NOTE: This sheet is a summary. It may not cover all possible information. If you have questions about this medicine, talk to your doctor, pharmacist, or  health care provider.  2018 Elsevier/Gold Standard (2007-06-22 22:10:20)

## 2017-01-16 ENCOUNTER — Ambulatory Visit (INDEPENDENT_AMBULATORY_CARE_PROVIDER_SITE_OTHER): Payer: Medicare Other | Admitting: Orthopaedic Surgery

## 2017-01-17 ENCOUNTER — Ambulatory Visit: Payer: Medicare Other | Admitting: Physical Therapy

## 2017-01-29 ENCOUNTER — Encounter: Payer: Self-pay | Admitting: Physical Therapy

## 2017-01-29 ENCOUNTER — Ambulatory Visit: Payer: Medicare Other | Attending: Internal Medicine | Admitting: Physical Therapy

## 2017-01-29 DIAGNOSIS — R2689 Other abnormalities of gait and mobility: Secondary | ICD-10-CM | POA: Insufficient documentation

## 2017-01-29 DIAGNOSIS — L309 Dermatitis, unspecified: Secondary | ICD-10-CM | POA: Diagnosis not present

## 2017-01-29 DIAGNOSIS — M25652 Stiffness of left hip, not elsewhere classified: Secondary | ICD-10-CM | POA: Insufficient documentation

## 2017-01-29 DIAGNOSIS — M25552 Pain in left hip: Secondary | ICD-10-CM | POA: Insufficient documentation

## 2017-01-29 DIAGNOSIS — L819 Disorder of pigmentation, unspecified: Secondary | ICD-10-CM | POA: Diagnosis not present

## 2017-01-29 NOTE — Therapy (Signed)
[place], [place], [address] Phone: [phone]   Fax:  [phone]  Physical Therapy Treatment  Patient Details  Name: Abigail Hoover MRN: [id number] Date of Birth: [birth year] Referring Provider: Dr. Rod Can   Encounter Date: 01/29/2017  PT End of Session - 01/29/17 1508    Visit Number  7    Number of Visits  18    Date for PT Re-Evaluation  02/18/17    Authorization Type  Medicare/BCBS - G codes and Progress Notes    PT Start Time  1751    PT Stop Time  1525    PT Time Calculation (min)  51 min    Activity Tolerance  Patient limited by fatigue    Behavior During Therapy  [facility] for tasks assessed/performed       Past Medical History:  Diagnosis Date  . 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 Anxiety   . Arthritis   . Bladder irritation   . Diabetes mellitus   . GERD (gastroesophageal reflux disease)   . Hypercholesterolemia   . Hypertension   . Hypothyroidism   . Iron deficiency anemia, unspecified   . Psoriasis   . Transfusion history 03/05/2016   for postoperative (ORIF) anemia superimposed on chronic anemia    Past Surgical History:  Procedure Laterality Date  . ABDOMINAL HYSTERECTOMY     TAH  . BREAST BIOPSY     Left breast biopsy benign lesion  . CATARACT EXTRACTION    . CERVICAL DISC SURGERY    . COLONOSCOPY  2013   neg. next 2023.   Marland Kitchen EYE SURGERY    . LEFT HIP SURGERY      There were no vitals filed for this visit.  Subjective Assessment - 01/29/17 1438    Subjective  She has liimited energy due to multiple health issues. L hip hurts today     Currently in Pain?  Yes    Pain Score  6     Pain Location  Hip    Pain Orientation  Left;Anterior;Proximal    Pain Type  Chronic pain    Pain Onset  More than a month ago    Pain Frequency  Intermittent                      [facility] Adult PT Treatment/Exercise - 01/29/17 0001      Self-Care   Other  Self-Care Comments   HEP, progress updates in function and health       Knee/Hip Exercises: Stretches   Hip Flexor Stretch  2 reps;30 seconds    Knee: Self-Stretch to increase Flexion  -- standing    standing      Knee/Hip Exercises: Aerobic   Nustep  L5 LE and UE for 5 min       Knee/Hip Exercises: Standing   Hip Abduction  Stengthening;Both;1 set;10 reps      Knee/Hip Exercises: Supine   Bridges  Strengthening;Both;1 set;15 reps    Straight Leg Raises  Strengthening;Both;1 set uses hands for support    uses hands for support    Straight Leg Raise with External Rotation  Strengthening;Both;1 set    Other Supine Knee/Hip Exercises  supine clam x 10 red band bilat.      Modalities   Modalities  Moist Heat      Moist Heat Therapy   Number Minutes Moist Heat  10 Minutes    Moist Heat Location  Hip  left   left            PT Education - 01/29/17 1507    Education provided  Yes    Education Details  consistent HEP    Person(s) Educated  Patient    Methods  Explanation    Comprehension  Verbalized understanding          PT Long Term Goals - 01/29/17 1509      PT LONG TERM GOAL #1   Title  independent with HEP    Baseline  cues     Status  On-going      PT LONG TERM GOAL #2   Title  demonstrate 4/5 strength Lt hip for improved functional mobility    Status  On-going      PT LONG TERM GOAL #3   Title  report ability to walk > 30 min with min increase in pain for improved function    Baseline  5-10 min     Status  On-going      PT LONG TERM GOAL #4   Title  report pain < 3/10 with standing 1 hour to cook    Status  On-going            Plan - 01/29/17 1509    Clinical Impression Statement  Patient returns, reports she was unable to make her afternoon appts.  She made more today that work better for her.  She is nearly 1 yr out from her surgery.  She is fatigued overall and lacks hip abduction and extension strength, worked in standing more effectively.   She was encouraged to continue HEP as able and keep track of how long she can be up at one time.      PT Next Visit Plan  review HEP, manual to TFL, glut med strengthening; pt with poor tolerance to prone so limit prone activities    PT Home Exercise Plan  sit to stand, ITB and piriformis, bridging, clam     Consulted and Agree with Plan of Care  Patient       Patient will benefit from skilled therapeutic intervention in order to improve the following deficits and impairments:  Abnormal gait, Impaired flexibility, Decreased range of motion, Difficulty walking, Decreased mobility, Decreased strength, Increased fascial restricitons, Increased muscle spasms, Pain  Visit Diagnosis: Pain in left hip  Stiffness of left hip, not elsewhere classified  Other abnormalities of gait and mobility     Problem List Patient Active Problem List   Diagnosis Date Noted  . Hypercalcemia 12/16/2016  . Vitamin B 12 deficiency 12/16/2016  . Displaced fracture of left femoral neck ([place]) 03/02/2016  . Hyperkalemia 03/01/2016  . Femoral neck fracture ([place]) 02/29/2016  . Anemia due to stage 3 (moderate) chronic renal failure treated with erythropoietin ([place]) 12/18/2015  . Chronic kidney disease, stage III (moderate) ([place]) 08/14/2015  . 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 Acute coronary syndrome ([place]) 01/02/2015  . Deficiency anemia 05/27/2014  . Carpal tunnel syndrome 12/09/2013  . Diabetic neuropathy ([place]) 12/09/2013  . Type 2 diabetes mellitus with neurological manifestations, controlled ([place]) 04/21/2013  . Disturbance of skin sensation 04/21/2013  . Hypothyroidism 10/30/2012  . Breast mass in female-left 03/10/2012  . Weight loss 11/28/2011  . Family history of breast cancer 08/01/2010  . Essential hypertension 02/01/2009  . EDEMA LEGS 02/01/2009    Melissa Pulido 01/29/2017, 3:20 PM  [facility] [address] [place], [place],  [address] Phone:  [phone]   Fax:  [phone]  Name: Abigail Hoover MRN: [id number] Date of Birth: 1945-10-05  Raeford Razor, PT 01/29/17 3:20 PM Phone: [phone] Fax: [phone]

## 2017-01-30 ENCOUNTER — Ambulatory Visit: Payer: Medicare Other | Admitting: Physical Therapy

## 2017-01-30 DIAGNOSIS — R2689 Other abnormalities of gait and mobility: Secondary | ICD-10-CM | POA: Diagnosis not present

## 2017-01-30 DIAGNOSIS — M25552 Pain in left hip: Secondary | ICD-10-CM | POA: Diagnosis not present

## 2017-01-30 DIAGNOSIS — M25652 Stiffness of left hip, not elsewhere classified: Secondary | ICD-10-CM

## 2017-01-30 NOTE — Therapy (Signed)
[place], [place], [address] Phone: [phone]   Fax:  [phone]  Physical Therapy Treatment  Patient Details  Name: Abigail Hoover MRN: [id number] Date of Birth: 03-04-1946 Referring Provider: Dr. Rod Can   Encounter Date: 01/30/2017  PT End of Session - 01/30/17 1041    Visit Number  8    Number of Visits  18    Date for PT Re-Evaluation  02/18/17    Authorization Type  Medicare/BCBS - G codes and Progress Notes    PT Start Time  1017    PT Stop Time  1108    PT Time Calculation (min)  51 min    Activity Tolerance  Patient limited by fatigue    Behavior During Therapy  [facility] for tasks assessed/performed       Past Medical History:  Diagnosis Date  . 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 Anxiety   . Arthritis   . Bladder irritation   . Diabetes mellitus   . GERD (gastroesophageal reflux disease)   . Hypercholesterolemia   . Hypertension   . Hypothyroidism   . Iron deficiency anemia, unspecified   . Psoriasis   . Transfusion history 03/05/2016   for postoperative (ORIF) anemia superimposed on chronic anemia    Past Surgical History:  Procedure Laterality Date  . ABDOMINAL HYSTERECTOMY     TAH  . BREAST BIOPSY     Left breast biopsy benign lesion  . CATARACT EXTRACTION    . CERVICAL DISC SURGERY    . COLONOSCOPY  2013   neg. next 2023.   Marland Kitchen EYE SURGERY    . LEFT HIP SURGERY      There were no vitals filed for this visit.  Subjective Assessment - 01/30/17 1036    Subjective  Patient reports her hjip hurts. She can not recall what causes her hip to hurt. It hurts a little today.     How long can you walk comfortably?  standing 1 hour is OK cooks but takes breaks     Patient Stated Goals  improve pain, be able to do housework without pain    Pain Score  5     Pain Location  Hip    Pain Orientation  Left;Anterior    Pain Descriptors / Indicators  Burning;Aching    Pain Type  Chronic pain    Pain Onset  More than a month ago    Pain Frequency  Intermittent    Aggravating Factors   could not recall     Pain Relieving Factors  could not recall                       [facility] Adult PT Treatment/Exercise - 01/30/17 0001      Knee/Hip Exercises: Stretches   Piriformis Stretch  Both;2 reps;30 seconds    Piriformis Stretch Limitations  knee to opposite shoulder      Knee/Hip Exercises: Aerobic   Nustep  L5 LE and UE for 5 min       Knee/Hip Exercises: Standing   Hip Abduction  Stengthening;Both;1 set;10 reps      Knee/Hip Exercises: Supine   Bridges  Strengthening;Both;1 set;10 reps    Straight Leg Raises  Strengthening;Both;1 set uses hands for support     Straight Leg Raise with External Rotation  Strengthening;Both;1 set    Other Supine Knee/Hip Exercises  supine clam x 10 red band bilat.  Modalities   Modalities  Moist Heat      Moist Heat Therapy   Number Minutes Moist Heat  10 Minutes    Moist Heat Location  Hip      Manual Therapy   Manual therapy comments  manual trigger point release along rectus femoris and gluteals  using pool noodle.  how to perform at home             PT Education - 01/30/17 1040    Education provided  Yes    Education Details  reviewed technique with stretches. needed max cuing     Person(s) Educated  Patient    Methods  Explanation    Comprehension  Verbalized understanding;Returned demonstration;Verbal cues required;Tactile cues required          PT Long Term Goals - 01/30/17 1728      PT LONG TERM GOAL #1   Title  independent with HEP    Baseline  cues     Time  6    Period  Weeks    Status  On-going      PT LONG TERM GOAL #2   Title  demonstrate 4/5 strength Lt hip for improved functional mobility    Time  6    Period  Weeks    Status  On-going      PT LONG TERM GOAL #3   Title  report ability to walk > 30 min with min increase in pain for improved function     Baseline  5-10 min     Time  6    Period  Weeks    Status  On-going      PT LONG TERM GOAL #4   Title  report pain < 3/10 with standing 1 hour to cook    Time  6    Period  Weeks    Status  On-going            Plan - 01/30/17 1727    Clinical Impression Statement  Patient tolerated treatment but required moderate cuing for technique wqith all exercises. She was encouraged to work on her exercises at home but she will likley need more education for proper technique.     Clinical Presentation  Stable    Clinical Decision Making  Low    Rehab Potential  Good    PT Frequency  2x / week    PT Duration  6 weeks    PT Treatment/Interventions  ADLs/Self Care Home Management;Cryotherapy;Electrical Stimulation;Moist Heat;Ultrasound;Neuromuscular re-education;Balance training;Therapeutic exercise;Iontophoresis 4mg /ml Dexamethasone;Therapeutic activities;Functional mobility training;Gait training;Patient/family education;Manual techniques;Passive range of motion;Taping;Dry needling    PT Next Visit Plan  review HEP, manual to TFL, glut med strengthening; pt with poor tolerance to prone so limit prone activities    PT Home Exercise Plan  sit to stand, ITB and piriformis, bridging, clam     Consulted and Agree with Plan of Care  Patient       Patient will benefit from skilled therapeutic intervention in order to improve the following deficits and impairments:  Abnormal gait, Impaired flexibility, Decreased range of motion, Difficulty walking, Decreased mobility, Decreased strength, Increased fascial restricitons, Increased muscle spasms, Pain  Visit Diagnosis: Pain in left hip  Stiffness of left hip, not elsewhere classified  Other abnormalities of gait and mobility     Problem List Patient Active Problem List   Diagnosis Date Noted  . Hypercalcemia 12/16/2016  . Vitamin B 12 deficiency 12/16/2016  . Displaced fracture of left femoral neck (  [place]) 03/02/2016  . Hyperkalemia  03/01/2016  . Femoral neck fracture ([place]) 02/29/2016  . Anemia due to stage 3 (moderate) chronic renal failure treated with erythropoietin ([place]) 12/18/2015  . Chronic kidney disease, stage III (moderate) ([place]) 08/14/2015  . 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 Acute coronary syndrome ([place]) 01/02/2015  . Deficiency anemia 05/27/2014  . Carpal tunnel syndrome 12/09/2013  . Diabetic neuropathy ([place]) 12/09/2013  . Type 2 diabetes mellitus with neurological manifestations, controlled ([place]) 04/21/2013  . Disturbance of skin sensation 04/21/2013  . Hypothyroidism 10/30/2012  . Breast mass in female-left 03/10/2012  . Weight loss 11/28/2011  . Family history of breast cancer 08/01/2010  . Essential hypertension 02/01/2009  . EDEMA LEGS 02/01/2009    Carney Living PT DPT  01/30/2017, 5:31 PM  [facility] [address] [place], [place], [address] Phone: [phone]   Fax:  [phone]  Name: Jarrah Seher MRN: [id number] Date of Birth: 11-28-1945

## 2017-02-03 ENCOUNTER — Ambulatory Visit: Payer: Medicare Other | Admitting: Physical Therapy

## 2017-02-03 DIAGNOSIS — M25552 Pain in left hip: Secondary | ICD-10-CM | POA: Diagnosis not present

## 2017-02-03 DIAGNOSIS — M25652 Stiffness of left hip, not elsewhere classified: Secondary | ICD-10-CM

## 2017-02-03 DIAGNOSIS — R2689 Other abnormalities of gait and mobility: Secondary | ICD-10-CM

## 2017-02-03 NOTE — Therapy (Signed)
[place], [place], [address] Phone: [phone]   Fax:  [phone]  Physical Therapy Treatment  Patient Details  Name: Abigail Hoover MRN: [id number] Date of Birth: 23-Jul-1945 Referring Provider: Dr. Rod Can   Encounter Date: 02/03/2017  PT End of Session - 02/03/17 1116    Visit Number  9    Number of Visits  18    Date for PT Re-Evaluation  02/18/17    Authorization Type  Medicare/BCBS - G codes and Progress Notes    PT Start Time  1108 Patient 8 minutes late to her appointment     PT Stop Time  1146    PT Time Calculation (min)  38 min    Activity Tolerance  Patient tolerated treatment well    Behavior During Therapy  [facility] for tasks assessed/performed       Past Medical History:  Diagnosis Date  . 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 Anxiety   . Arthritis   . Bladder irritation   . Diabetes mellitus   . GERD (gastroesophageal reflux disease)   . Hypercholesterolemia   . Hypertension   . Hypothyroidism   . Iron deficiency anemia, unspecified   . Psoriasis   . Transfusion history 03/05/2016   for postoperative (ORIF) anemia superimposed on chronic anemia    Past Surgical History:  Procedure Laterality Date  . ABDOMINAL HYSTERECTOMY     TAH  . BREAST BIOPSY     Left breast biopsy benign lesion  . CATARACT EXTRACTION    . CERVICAL DISC SURGERY    . COLONOSCOPY  2013   neg. next 2023.   Marland Kitchen EYE SURGERY    . LEFT HIP SURGERY      There were no vitals filed for this visit.  Subjective Assessment - 02/03/17 1112    Subjective  Patient reports her left hip is still sore but it is better when she does exercises. She has pain in her anterior left thigh today.     Pertinent History  s/p L THA 02/2016    Limitations  Walking    How long can you walk comfortably?  standing 1 hour is OK cooks but takes breaks     Currently in Pain?  Yes    Pain Score  5     Pain Location  Hip     Pain Orientation  Left;Anterior    Pain Descriptors / Indicators  Burning;Aching    Pain Type  Chronic pain    Pain Onset  More than a month ago    Pain Frequency  Intermittent    Aggravating Factors   performing daily tasks     Pain Relieving Factors  exercises                       [facility] Adult PT Treatment/Exercise - 02/03/17 0001      Knee/Hip Exercises: Stretches   Active Hamstring Stretch  Left;2 reps;30 seconds    Piriformis Stretch  Both;2 reps;30 seconds    Piriformis Stretch Limitations  knee to opposite shoulder    Other Knee/Hip Stretches  lateral trunk rotation       Knee/Hip Exercises: Aerobic   Nustep  patient 8 minutes late so held       Knee/Hip Exercises: Supine   Bridges  Strengthening;Both;1 set;10 reps    Bridges Limitations  x10 with min ucuing for technique     Straight Leg Raises Limitations  x0 bilateral     Other Supine Knee/Hip Exercises  supine clam 2x 10 red band bilat.      Modalities   Modalities  Moist Heat      Moist Heat Therapy   Number Minutes Moist Heat  10 Minutes    Moist Heat Location  Hip      Manual Therapy   Manual therapy comments  manual IT band toll out with roller; LAD to left lower extremity 3x20 sec hold              PT Education - 02/03/17 1116    Education provided  Yes    Education Details  technique with ther-ex    Person(s) Educated  Patient    Methods  Explanation;Demonstration;Tactile cues;Verbal cues    Comprehension  Verbalized understanding;Returned demonstration;Verbal cues required;Tactile cues required          PT Long Term Goals - 01/30/17 1728      PT LONG TERM GOAL #1   Title  independent with HEP    Baseline  cues     Time  6    Period  Weeks    Status  On-going      PT LONG TERM GOAL #2   Title  demonstrate 4/5 strength Lt hip for improved functional mobility    Time  6    Period  Weeks    Status  On-going      PT LONG TERM GOAL #3   Title  report ability to  walk > 30 min with min increase in pain for improved function    Baseline  5-10 min     Time  6    Period  Weeks    Status  On-going      PT LONG TERM GOAL #4   Title  report pain < 3/10 with standing 1 hour to cook    Time  6    Period  Weeks    Status  On-going            Plan - 02/03/17 1117    Clinical Impression Statement  Less cuing required for ther-ex today. Good tolerance to stretching and exercises. She was encouraged to continue at home     Clinical Presentation  Stable    Clinical Decision Making  Low    Rehab Potential  Good    PT Frequency  2x / week    PT Duration  6 weeks    PT Treatment/Interventions  ADLs/Self Care Home Management;Cryotherapy;Electrical Stimulation;Moist Heat;Ultrasound;Neuromuscular re-education;Balance training;Therapeutic exercise;Iontophoresis 4mg /ml Dexamethasone;Therapeutic activities;Functional mobility training;Gait training;Patient/family education;Manual techniques;Passive range of motion;Taping;Dry needling    PT Next Visit Plan  continue to work on stretching and strengthening exercises. review technique with ther-ex     PT Home Exercise Plan  sit to stand, ITB and piriformis, bridging, clam     Consulted and Agree with Plan of Care  Patient       Patient will benefit from skilled therapeutic intervention in order to improve the following deficits and impairments:  Abnormal gait, Impaired flexibility, Decreased range of motion, Difficulty walking, Decreased mobility, Decreased strength, Increased fascial restricitons, Increased muscle spasms, Pain  Visit Diagnosis: Pain in left hip  Stiffness of left hip, not elsewhere classified  Other abnormalities of gait and mobility     Problem List Patient Active Problem List   Diagnosis Date Noted  . Hypercalcemia 12/16/2016  . Vitamin B 12 deficiency 12/16/2016  . Displaced fracture of left femoral neck ([place])  03/02/2016  . Hyperkalemia 03/01/2016  . Femoral neck fracture ([place])  02/29/2016  . Anemia due to stage 3 (moderate) chronic renal failure treated with erythropoietin ([place]) 12/18/2015  . Chronic kidney disease, stage III (moderate) ([place]) 08/14/2015  . 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 Acute coronary syndrome ([place]) 01/02/2015  . Deficiency anemia 05/27/2014  . Carpal tunnel syndrome 12/09/2013  . Diabetic neuropathy ([place]) 12/09/2013  . Type 2 diabetes mellitus with neurological manifestations, controlled ([place]) 04/21/2013  . Disturbance of skin sensation 04/21/2013  . Hypothyroidism 10/30/2012  . Breast mass in female-left 03/10/2012  . Weight loss 11/28/2011  . Family history of breast cancer 08/01/2010  . Essential hypertension 02/01/2009  . EDEMA LEGS 02/01/2009    Carney Living PT DPT  02/03/2017, 2:52 PM  [facility] [address] [place], [place], [address] Phone: [phone]   Fax:  [phone]  Name: Abigail Hoover MRN: [id number] Date of Birth: 1945-07-10

## 2017-02-05 ENCOUNTER — Ambulatory Visit: Payer: Medicare Other | Admitting: Physical Therapy

## 2017-02-05 DIAGNOSIS — M25552 Pain in left hip: Secondary | ICD-10-CM | POA: Diagnosis not present

## 2017-02-05 DIAGNOSIS — R2689 Other abnormalities of gait and mobility: Secondary | ICD-10-CM | POA: Diagnosis not present

## 2017-02-05 DIAGNOSIS — M25652 Stiffness of left hip, not elsewhere classified: Secondary | ICD-10-CM | POA: Diagnosis not present

## 2017-02-06 ENCOUNTER — Encounter: Payer: Self-pay | Admitting: Physical Therapy

## 2017-02-06 NOTE — Therapy (Signed)
[place], [place], [address] Phone: [phone]   Fax:  [phone]  Physical Therapy Treatment  Patient Details  Name: Abigail Hoover MRN: [id number] Date of Birth: 09-29-1945 Referring Provider: Dr. Rod Can   Encounter Date: 02/05/2017  PT End of Session - 02/06/17 0958    Visit Number  10    Number of Visits  18    Date for PT Re-Evaluation  02/18/17    Authorization Type  Medicare/BCBS - G codes and Progress Notes    PT Start Time  1105    PT Stop Time  1145    PT Time Calculation (min)  40 min    Activity Tolerance  Patient tolerated treatment well    Behavior During Therapy  [facility] for tasks assessed/performed       Past Medical History:  Diagnosis Date  . 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 Anxiety   . Arthritis   . Bladder irritation   . Diabetes mellitus   . GERD (gastroesophageal reflux disease)   . Hypercholesterolemia   . Hypertension   . Hypothyroidism   . Iron deficiency anemia, unspecified   . Psoriasis   . Transfusion history 03/05/2016   for postoperative (ORIF) anemia superimposed on chronic anemia    Past Surgical History:  Procedure Laterality Date  . ABDOMINAL HYSTERECTOMY     TAH  . BREAST BIOPSY     Left breast biopsy benign lesion  . BREAST BIOPSY  03/11/2012   Procedure: BREAST BIOPSY;  Surgeon: Odis Hollingshead, MD;  Location: [place];  Service: General;  Laterality: Left;  remove left breast mass  . CARDIAC CATHETERIZATION N/A 01/02/2015   Procedure: Left Heart Cath and Coronary Angiography;  Surgeon: Charolette Forward, MD;  Location: [place] [facility];  Service: Cardiovascular;  Laterality: N/A;  . CATARACT EXTRACTION    . CERVICAL DISC SURGERY    . COLONOSCOPY  2013   neg. next 2023.   Marland Kitchen EYE SURGERY    . LEFT HIP SURGERY    . TOTAL HIP ARTHROPLASTY Left 03/02/2016   Procedure: TOTAL HIP ARTHROPLASTY ANTERIOR APPROACH;  Surgeon: Rod Can,  MD;  Location: [place];  Service: Orthopedics;  Laterality: Left;    There were no vitals filed for this visit.  Subjective Assessment - 02/06/17 0956    Subjective  Patient reports just some soreness into her quad. she has been trying to do her exercises at home.     Pertinent History  s/p L THA 02/2016    Limitations  Walking    How long can you walk comfortably?  standing 1 hour is OK cooks but takes breaks     Patient Stated Goals  improve pain, be able to do housework without pain    Currently in Pain?  No/denies                      [facility] Adult PT Treatment/Exercise - 02/06/17 0001      Knee/Hip Exercises: Stretches   Active Hamstring Stretch  Left;2 reps;30 seconds    Piriformis Stretch  Both;2 reps;30 seconds    Piriformis Stretch Limitations  knee to opposite shoulder    Other Knee/Hip Stretches  lateral trunk rotation       Knee/Hip Exercises: Aerobic   Nustep  patient 8 minutes late so held       Knee/Hip Exercises: Supine   Bridges  Strengthening;Both;1 set;10 reps  Bridges Limitations  x10 with min ucuing for technique     Straight Leg Raises Limitations  x0 bilateral     Other Supine Knee/Hip Exercises  supine clam 2x 10 red band bilat.      Modalities   Modalities  Moist Heat      Moist Heat Therapy   Moist Heat Location  Hip      Manual Therapy   Manual therapy comments  manual IT band toll out with roller; LAD to left lower extremity 3x20 sec hold              PT Education - 02/06/17 0958    Education provided  Yes    Education Details  reviewed technique with ther-ex     Person(s) Educated  Patient    Methods  Explanation;Demonstration;Tactile cues;Verbal cues    Comprehension  Verbalized understanding;Returned demonstration;Verbal cues required;Tactile cues required          PT Long Term Goals - 02/06/17 1000      PT LONG TERM GOAL #1   Title  independent with HEP    Baseline  cues     Time  6    Period  Weeks     Status  On-going      PT LONG TERM GOAL #2   Title  demonstrate 4/5 strength Lt hip for improved functional mobility    Time  6    Period  Weeks    Status  On-going      PT LONG TERM GOAL #3   Title  report ability to walk > 30 min with min increase in pain for improved function    Baseline  5-10 min     Time  6    Period  Weeks    Status  On-going      PT LONG TERM GOAL #4   Title  report pain < 3/10 with standing 1 hour to cook    Time  6    Period  Weeks    Status  On-going            Plan - 02/06/17 0959    Clinical Impression Statement  Therapy continues to keep plan consitetn to help her feel more comfportable with her exercises at home. Se has an HEP but reports she only does some of them. She tolerated all exercises well and only required min cuing. She is likley progressing towards discharge.     Clinical Presentation  Stable    Clinical Decision Making  Low    Rehab Potential  Good    PT Frequency  2x / week    PT Duration  6 weeks    PT Treatment/Interventions  ADLs/Self Care Home Management;Cryotherapy;Electrical Stimulation;Moist Heat;Ultrasound;Neuromuscular re-education;Balance training;Therapeutic exercise;Iontophoresis 4mg /ml Dexamethasone;Therapeutic activities;Functional mobility training;Gait training;Patient/family education;Manual techniques;Passive range of motion;Taping;Dry needling    PT Next Visit Plan  continue to work on stretching and strengthening exercises. review technique with ther-ex     PT Home Exercise Plan  sit to stand, ITB and piriformis, bridging, clam     Consulted and Agree with Plan of Care  Patient       Patient will benefit from skilled therapeutic intervention in order to improve the following deficits and impairments:  Abnormal gait, Impaired flexibility, Decreased range of motion, Difficulty walking, Decreased mobility, Decreased strength, Increased fascial restricitons, Increased muscle spasms, Pain  Visit Diagnosis: Pain  in left hip  Stiffness of left hip, not elsewhere classified  Other abnormalities of gait  and mobility   G-Codes - 02/06/17 1001    Functional Assessment Tool Used (Outpatient Only)  clinical judgement    Functional Limitation  Mobility: Walking and moving around    Mobility: Walking and Moving Around Current Status [phone])  At least 20 percent but less than 40 percent impaired, limited or restricted    Mobility: Walking and Moving Around Goal Status [id number])  At least 1 percent but less than 20 percent impaired, limited or restricted       Problem List Patient Active Problem List   Diagnosis Date Noted  . Hypercalcemia 12/16/2016  . Vitamin B 12 deficiency 12/16/2016  . Displaced fracture of left femoral neck ([place]) 03/02/2016  . Hyperkalemia 03/01/2016  . Femoral neck fracture ([place]) 02/29/2016  . Anemia due to stage 3 (moderate) chronic renal failure treated with erythropoietin ([place]) 12/18/2015  . Chronic kidney disease, stage III (moderate) ([place]) 08/14/2015  . 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 Acute coronary syndrome ([place]) 01/02/2015  . Deficiency anemia 05/27/2014  . Carpal tunnel syndrome 12/09/2013  . Diabetic neuropathy ([place]) 12/09/2013  . Type 2 diabetes mellitus with neurological manifestations, controlled ([place]) 04/21/2013  . Disturbance of skin sensation 04/21/2013  . Hypothyroidism 10/30/2012  . Breast mass in female-left 03/10/2012  . Weight loss 11/28/2011  . Family history of breast cancer 08/01/2010  . Essential hypertension 02/01/2009  . EDEMA LEGS 02/01/2009    Carney Living PT DPT  02/06/2017, 10:02 AM  [facility] [address] [place], [place], [address] Phone: [phone]   Fax:  [phone]  Name: Samaia Iwata MRN: [id number] Date of Birth: 1946-03-25

## 2017-02-07 DIAGNOSIS — Z9189 Other specified personal risk factors, not elsewhere classified: Secondary | ICD-10-CM | POA: Diagnosis not present

## 2017-02-07 DIAGNOSIS — N649 Disorder of breast, unspecified: Secondary | ICD-10-CM | POA: Diagnosis not present

## 2017-02-10 ENCOUNTER — Encounter: Payer: Self-pay | Admitting: Physical Therapy

## 2017-02-10 ENCOUNTER — Other Ambulatory Visit: Payer: Self-pay

## 2017-02-10 ENCOUNTER — Ambulatory Visit: Payer: Medicare Other | Admitting: Physical Therapy

## 2017-02-10 DIAGNOSIS — M25652 Stiffness of left hip, not elsewhere classified: Secondary | ICD-10-CM | POA: Diagnosis not present

## 2017-02-10 DIAGNOSIS — M25552 Pain in left hip: Secondary | ICD-10-CM

## 2017-02-10 DIAGNOSIS — R2689 Other abnormalities of gait and mobility: Secondary | ICD-10-CM | POA: Diagnosis not present

## 2017-02-10 NOTE — Therapy (Signed)
[place], [place], [address] Phone: [phone]   Fax:  [phone]  Physical Therapy Treatment  Patient Details  Name: Abigail Hoover MRN: [id number] Date of Birth: 05/27/1945 Referring Provider: Dr. Rod Can   Encounter Date: 02/10/2017  PT End of Session - 02/10/17 1146    Visit Number  11    Number of Visits  18    Date for PT Re-Evaluation  02/18/17    Authorization Type  Medicare/BCBS - G codes and Progress Notes    PT Start Time  1105    PT Stop Time  1154    PT Time Calculation (min)  49 min    Activity Tolerance  Patient tolerated treatment well    Behavior During Therapy  [facility] for tasks assessed/performed       Past Medical History:  Diagnosis Date  . 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 Anxiety   . Arthritis   . Bladder irritation   . Diabetes mellitus   . GERD (gastroesophageal reflux disease)   . Hypercholesterolemia   . Hypertension   . Hypothyroidism   . Iron deficiency anemia, unspecified   . Psoriasis   . Transfusion history 03/05/2016   for postoperative (ORIF) anemia superimposed on chronic anemia    Past Surgical History:  Procedure Laterality Date  . ABDOMINAL HYSTERECTOMY     TAH  . BREAST BIOPSY     Left breast biopsy benign lesion  . BREAST BIOPSY Left 03/11/2012   Performed by Odis Hollingshead, MD at [place]  . CATARACT EXTRACTION    . CERVICAL DISC SURGERY    . COLONOSCOPY  2013   neg. next 2023.   Marland Kitchen EYE SURGERY    . Left Heart Cath and Coronary Angiography N/A 01/02/2015   Performed by Charolette Forward, MD at [place] [facility]  . LEFT HIP SURGERY    . TOTAL HIP ARTHROPLASTY ANTERIOR APPROACH Left 03/02/2016   Performed by Rod Can, MD at [facility] OR    There were no vitals filed for this visit.  Subjective Assessment - 02/10/17 1109    Subjective  "I doing the exercise at home, still get occasional with standing/ sitting"     Pain Score   4     Pain Location  Hip    Pain Orientation  Left    Pain Descriptors / Indicators  Aching    Pain Type  Chronic pain    Pain Onset  More than a month ago    Pain Frequency  Intermittent    Aggravating Factors   N/A    Pain Relieving Factors  exercise                      [facility] Adult PT Treatment/Exercise - 02/10/17 1113      Knee/Hip Exercises: Aerobic   Nustep  L4 x 5 min LE only promote strength      Knee/Hip Exercises: Standing   Hip Extension  Stengthening;Both;2 sets;10 reps;Knee straight with yellow theraband      Knee/Hip Exercises: Seated   Sit to Sand  2 sets;10 reps;without UE support pushing through hands on knees, verbal cues for form      Knee/Hip Exercises: Supine   Bridges  1 set;5 reps    Bridges with Clamshell  2 sets;10 reps;Both;Strengthening with red theraband    Other Supine Knee/Hip Exercises  hip marching supine with red theraband around knees 2 x  15      Moist Heat Therapy   Number Minutes Moist Heat  10 Minutes    Moist Heat Location  Hip R sidelying      Manual Therapy   Manual therapy comments  Rolling of rectur femoris             PT Education - 02/10/17 1153    Education provided  Yes    Education Details  proper sit to stand mechanics and avoid using her knees on the table and demonstrated potential effect of falling.     Person(s) Educated  Patient    Methods  Explanation;Verbal cues    Comprehension  Verbalized understanding;Verbal cues required          PT Long Term Goals - 02/06/17 1000      PT LONG TERM GOAL #1   Title  independent with HEP    Baseline  cues     Time  6    Period  Weeks    Status  On-going      PT LONG TERM GOAL #2   Title  demonstrate 4/5 strength Lt hip for improved functional mobility    Time  6    Period  Weeks    Status  On-going      PT LONG TERM GOAL #3   Title  report ability to walk > 30 min with min increase in pain for improved function    Baseline  5-10 min     Time   6    Period  Weeks    Status  On-going      PT LONG TERM GOAL #4   Title  report pain < 3/10 with standing 1 hour to cook    Time  6    Period  Weeks    Status  On-going            Plan - 02/10/17 1146    Clinical Impression Statement  pt continues to report pain inthe L anterior hip today. Continued working on hip strengthening in supine/ standing which she required verbal cues for proper form with sit to stand on mulitple times to avoid using the back of her knees stabilize. no increase in pain post session, continued MHP post session.     PT Next Visit Plan  continue to work on stretching and strengthening exercises. review technique with ther-ex     PT Home Exercise Plan  sit to stand, ITB and piriformis, bridging, clam     Consulted and Agree with Plan of Care  Patient       Patient will benefit from skilled therapeutic intervention in order to improve the following deficits and impairments:  Abnormal gait, Impaired flexibility, Decreased range of motion, Difficulty walking, Decreased mobility, Decreased strength, Increased fascial restricitons, Increased muscle spasms, Pain  Visit Diagnosis: Pain in left hip  Stiffness of left hip, not elsewhere classified  Other abnormalities of gait and mobility     Problem List Patient Active Problem List   Diagnosis Date Noted  . Hypercalcemia 12/16/2016  . Vitamin B 12 deficiency 12/16/2016  . Displaced fracture of left femoral neck ([place]) 03/02/2016  . Hyperkalemia 03/01/2016  . Femoral neck fracture ([place]) 02/29/2016  . Anemia due to stage 3 (moderate) chronic renal failure treated with erythropoietin ([place]) 12/18/2015  . Chronic kidney disease, stage III (moderate) ([place]) 08/14/2015  . 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 Acute coronary syndrome ([place]) 01/02/2015  . Deficiency anemia 05/27/2014  .  Carpal tunnel syndrome 12/09/2013  . Diabetic neuropathy ([place]) 12/09/2013  . Type 2 diabetes mellitus with  neurological manifestations, controlled ([place]) 04/21/2013  . Disturbance of skin sensation 04/21/2013  . Hypothyroidism 10/30/2012  . Breast mass in female-left 03/10/2012  . Weight loss 11/28/2011  . Family history of breast cancer 08/01/2010  . Essential hypertension 02/01/2009  . EDEMA LEGS 02/01/2009   Starr Lake PT, DPT, LAT, ATC  02/10/17  11:58 AM      [facility] [address] [place], [place], [address] Phone: [phone]   Fax:  [phone]  Name: Abigail Hoover MRN: [id number] Date of Birth: April 01, 1945

## 2017-02-12 ENCOUNTER — Encounter: Payer: Self-pay | Admitting: Physical Therapy

## 2017-02-12 ENCOUNTER — Ambulatory Visit: Payer: Medicare Other | Admitting: Physical Therapy

## 2017-02-12 DIAGNOSIS — R2689 Other abnormalities of gait and mobility: Secondary | ICD-10-CM

## 2017-02-12 DIAGNOSIS — M25652 Stiffness of left hip, not elsewhere classified: Secondary | ICD-10-CM

## 2017-02-12 DIAGNOSIS — M25552 Pain in left hip: Secondary | ICD-10-CM | POA: Diagnosis not present

## 2017-02-12 NOTE — Therapy (Signed)
[place], [place], [address] Phone: [phone]   Fax:  [phone]  Physical Therapy Treatment  Patient Details  Name: Abigail Hoover MRN: [id number] Date of Birth: Feb 10, 1946 Referring Provider: Dr. Rod Can   Encounter Date: 02/12/2017  PT End of Session - 02/12/17 2110    Visit Number  12    Number of Visits  18    Date for PT Re-Evaluation  02/18/17    Authorization Type  Medicare/BCBS - G codes and Progress Notes    PT Start Time  1100    PT Stop Time  1151    PT Time Calculation (min)  51 min    Activity Tolerance  Patient tolerated treatment well    Behavior During Therapy  [facility] for tasks assessed/performed       Past Medical History:  Diagnosis Date  . 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 Anxiety   . Arthritis   . Bladder irritation   . Diabetes mellitus   . GERD (gastroesophageal reflux disease)   . Hypercholesterolemia   . Hypertension   . Hypothyroidism   . Iron deficiency anemia, unspecified   . Psoriasis   . Transfusion history 03/05/2016   for postoperative (ORIF) anemia superimposed on chronic anemia    Past Surgical History:  Procedure Laterality Date  . ABDOMINAL HYSTERECTOMY     TAH  . BREAST BIOPSY     Left breast biopsy benign lesion  . BREAST BIOPSY  03/11/2012   Procedure: BREAST BIOPSY;  Surgeon: Odis Hollingshead, MD;  Location: [place];  Service: General;  Laterality: Left;  remove left breast mass  . CARDIAC CATHETERIZATION N/A 01/02/2015   Procedure: Left Heart Cath and Coronary Angiography;  Surgeon: Charolette Forward, MD;  Location: [facility];  Service: Cardiovascular;  Laterality: N/A;  . CATARACT EXTRACTION    . CERVICAL DISC SURGERY    . COLONOSCOPY  2013   neg. next 2023.   Marland Kitchen EYE SURGERY    . LEFT HIP SURGERY    . TOTAL HIP ARTHROPLASTY Left 03/02/2016   Procedure: TOTAL HIP ARTHROPLASTY ANTERIOR APPROACH;  Surgeon: Rod Can,  MD;  Location: [facility];  Service: Orthopedics;  Laterality: Left;    There were no vitals filed for this visit.  Subjective Assessment - 02/12/17 1109    Subjective  Patient reoirts her quad muscle hs been sore the last few days. She has been doing some of her exercises at home. Overall the hip has improved.     Pertinent History  s/p L THA 02/2016    How long can you walk comfortably?  standing 1 hour is OK cooks but takes breaks     Patient Stated Goals  improve pain, be able to do housework without pain    Currently in Pain?  No/denies                      [facility] Adult PT Treatment/Exercise - 02/12/17 0001      Knee/Hip Exercises: Stretches   Active Hamstring Stretch  Left;2 reps;30 seconds    Piriformis Stretch  Both;2 reps;30 seconds    Piriformis Stretch Limitations  knee to opposite shoulder    Other Knee/Hip Stretches  lateral trunk rotation       Knee/Hip Exercises: Aerobic   Nustep  -- promote strength      Knee/Hip Exercises: Standing   Hip Extension  Stengthening;Both;2 sets;10 reps;Knee straight with yellow  theraband      Knee/Hip Exercises: Supine   Bridges Limitations  x10     Other Supine Knee/Hip Exercises  hip marching supine with red theraband around knees 2 x 15      Modalities   Modalities  Moist Heat      Moist Heat Therapy   Number Minutes Moist Heat  10 Minutes    Moist Heat Location  Hip      Manual Therapy   Manual therapy comments  Rolling of rectur femoris             PT Education - 03-02-2017 1110    Education provided  Yes    Education Details  reviewed technique with ther-ex.     Person(s) Educated  Patient    Methods  Explanation;Demonstration;Tactile cues;Verbal cues    Comprehension  Verbalized understanding;Returned demonstration;Verbal cues required;Tactile cues required          PT Long Term Goals - 02-Mar-2017 1127      PT LONG TERM GOAL #1   Title  independent with HEP    Time  6    Period  Weeks     Status  Achieved      PT LONG TERM GOAL #2   Title  demonstrate 4/5 strength Lt hip for improved functional mobility    Baseline  4/5     Time  6    Period  Weeks    Status  On-going      PT LONG TERM GOAL #3   Title  report ability to walk > 30 min with min increase in pain for improved function    Baseline  5-10 min     Time  6    Period  Weeks    Status  On-going            Plan - 2017/03/02 1125    Clinical Impression Statement  At this point the patient feels comfortable with HEP and feels she is ready for discharge to home program. The patient was able to complete all exeercises with minimal cung. She is still having pain into her thigh at times but it is quedstiuonable if it is mucle soreness or some type of radicualr pain. D?C to HEP.     Clinical Presentation  Stable    Clinical Decision Making  Low    Rehab Potential  Good    PT Frequency  2x / week    PT Duration  6 weeks    PT Treatment/Interventions  ADLs/Self Care Home Management;Cryotherapy;Electrical Stimulation;Moist Heat;Ultrasound;Neuromuscular re-education;Balance training;Therapeutic exercise;Iontophoresis 4mg /ml Dexamethasone;Therapeutic activities;Functional mobility training;Gait training;Patient/family education;Manual techniques;Passive range of motion;Taping;Dry needling    PT Next Visit Plan  continue to work on stretching and strengthening exercises. review technique with ther-ex     PT Home Exercise Plan  sit to stand, ITB and piriformis, bridging, clam     Consulted and Agree with Plan of Care  Patient       Patient will benefit from skilled therapeutic intervention in order to improve the following deficits and impairments:  Abnormal gait, Impaired flexibility, Decreased range of motion, Difficulty walking, Decreased mobility, Decreased strength, Increased fascial restricitons, Increased muscle spasms, Pain  Visit Diagnosis: Pain in left hip  Stiffness of left hip, not elsewhere  classified  Other abnormalities of gait and mobility   G-Codes - 03-02-17 2110-07-04    Functional Assessment Tool Used (Outpatient Only)  clinical judgement    Functional Limitation  Mobility: Walking and moving around  Mobility: Walking and Moving Around Goal Status [phone])  At least 1 percent but less than 20 percent impaired, limited or restricted    Mobility: Walking and Moving Around Discharge Status [phone])  At least 1 percent but less than 20 percent impaired, limited or restricted       Problem List Patient Active Problem List   Diagnosis Date Noted  . Hypercalcemia 12/16/2016  . Vitamin B 12 deficiency 12/16/2016  . Displaced fracture of left femoral neck ([place]) 03/02/2016  . Hyperkalemia 03/01/2016  . Femoral neck fracture ([place]) 02/29/2016  . Anemia due to stage 3 (moderate) chronic renal failure treated with erythropoietin ([place]) 12/18/2015  . Chronic kidney disease, stage III (moderate) ([place]) 08/14/2015  . 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 Acute coronary syndrome ([place]) 01/02/2015  . Deficiency anemia 05/27/2014  . Carpal tunnel syndrome 12/09/2013  . Diabetic neuropathy ([place]) 12/09/2013  . Type 2 diabetes mellitus with neurological manifestations, controlled ([place]) 04/21/2013  . Disturbance of skin sensation 04/21/2013  . Hypothyroidism 10/30/2012  . Breast mass in female-left 03/10/2012  . Weight loss 11/28/2011  . Family history of breast cancer 08/01/2010  . Essential hypertension 02/01/2009  . EDEMA LEGS 02/01/2009    Carney Living PT DPT  02/12/2017, 9:13 PM  [facility] [address] [place], [place], [address] Phone: [phone]   Fax:  [phone]  Name: Abigail Hoover MRN: [id number] Date of Birth: [birth year]

## 2017-02-17 ENCOUNTER — Encounter: Payer: Self-pay | Admitting: Endocrinology

## 2017-02-17 DIAGNOSIS — N183 Chronic kidney disease, stage 3 (moderate): Secondary | ICD-10-CM | POA: Diagnosis not present

## 2017-02-17 DIAGNOSIS — N2581 Secondary hyperparathyroidism of renal origin: Secondary | ICD-10-CM | POA: Diagnosis not present

## 2017-02-17 DIAGNOSIS — D509 Iron deficiency anemia, unspecified: Secondary | ICD-10-CM | POA: Diagnosis not present

## 2017-02-19 DIAGNOSIS — N649 Disorder of breast, unspecified: Secondary | ICD-10-CM | POA: Diagnosis not present

## 2017-02-20 ENCOUNTER — Telehealth: Payer: Self-pay | Admitting: Hematology and Oncology

## 2017-02-20 ENCOUNTER — Ambulatory Visit (HOSPITAL_BASED_OUTPATIENT_CLINIC_OR_DEPARTMENT_OTHER): Payer: Medicare Other | Admitting: Hematology and Oncology

## 2017-02-20 ENCOUNTER — Other Ambulatory Visit (HOSPITAL_BASED_OUTPATIENT_CLINIC_OR_DEPARTMENT_OTHER): Payer: Medicare Other

## 2017-02-20 ENCOUNTER — Ambulatory Visit (HOSPITAL_BASED_OUTPATIENT_CLINIC_OR_DEPARTMENT_OTHER): Payer: Medicare Other

## 2017-02-20 VITALS — BP 131/65 | HR 67 | Temp 97.6°F | Resp 18 | Ht 59.0 in | Wt 114.0 lb

## 2017-02-20 DIAGNOSIS — N183 Chronic kidney disease, stage 3 unspecified: Secondary | ICD-10-CM

## 2017-02-20 DIAGNOSIS — E538 Deficiency of other specified B group vitamins: Secondary | ICD-10-CM

## 2017-02-20 DIAGNOSIS — Z803 Family history of malignant neoplasm of breast: Secondary | ICD-10-CM

## 2017-02-20 DIAGNOSIS — D631 Anemia in chronic kidney disease: Secondary | ICD-10-CM

## 2017-02-20 DIAGNOSIS — N189 Chronic kidney disease, unspecified: Secondary | ICD-10-CM

## 2017-02-20 LAB — CBC WITH DIFFERENTIAL/PLATELET
BASO%: 0.6 % (ref 0.0–2.0)
Basophils Absolute: 0 10*3/uL (ref 0.0–0.1)
EOS%: 6.3 % (ref 0.0–7.0)
Eosinophils Absolute: 0.3 10*3/uL (ref 0.0–0.5)
HCT: 27 % — ABNORMAL LOW (ref 34.8–46.6)
HGB: 8.5 g/dL — ABNORMAL LOW (ref 11.6–15.9)
LYMPH%: 27.2 % (ref 14.0–49.7)
MCH: 26.1 pg (ref 25.1–34.0)
MCHC: 31.6 g/dL (ref 31.5–36.0)
MCV: 82.5 fL (ref 79.5–101.0)
MONO#: 0.4 10*3/uL (ref 0.1–0.9)
MONO%: 6.8 % (ref 0.0–14.0)
NEUT#: 3 10*3/uL (ref 1.5–6.5)
NEUT%: 59.1 % (ref 38.4–76.8)
Platelets: 206 10*3/uL (ref 145–400)
RBC: 3.27 10*6/uL — ABNORMAL LOW (ref 3.70–5.45)
RDW: 16.2 % — ABNORMAL HIGH (ref 11.2–14.5)
WBC: 5.1 10*3/uL (ref 3.9–10.3)
lymph#: 1.4 10*3/uL (ref 0.9–3.3)

## 2017-02-20 MED ORDER — DARBEPOETIN ALFA 200 MCG/0.4ML IJ SOSY
200.0000 ug | PREFILLED_SYRINGE | Freq: Once | INTRAMUSCULAR | Status: AC
  Administered 2017-02-20: 200 ug via SUBCUTANEOUS
  Filled 2017-02-20: qty 0.4

## 2017-02-20 MED ORDER — CYANOCOBALAMIN 1000 MCG/ML IJ SOLN
1000.0000 ug | Freq: Once | INTRAMUSCULAR | Status: AC
  Administered 2017-02-20: 1000 ug via INTRAMUSCULAR

## 2017-02-20 NOTE — Telephone Encounter (Signed)
Gave avs and calendar for December and January 2019 °

## 2017-02-20 NOTE — Patient Instructions (Signed)
Cyanocobalamin, Vitamin B12 injection What is this medicine? CYANOCOBALAMIN (sye an oh koe BAL a min) is a man made form of vitamin B12. Vitamin B12 is used in the growth of healthy blood cells, nerve cells, and proteins in the body. It also helps with the metabolism of fats and carbohydrates. This medicine is used to treat people who can not absorb vitamin B12. This medicine may be used for other purposes; ask your health care provider or pharmacist if you have questions. COMMON BRAND NAME(S): B-12 Compliance Kit, B-12 Injection Kit, Cyomin, LA-12, Nutri-Twelve, Physicians EZ Use B-12, Primabalt What should I tell my health care provider before I take this medicine? They need to know if you have any of these conditions: -kidney disease -Leber's disease -megaloblastic anemia -an unusual or allergic reaction to cyanocobalamin, cobalt, other medicines, foods, dyes, or preservatives -pregnant or trying to get pregnant -breast-feeding How should I use this medicine? This medicine is injected into a muscle or deeply under the skin. It is usually given by a health care professional in a clinic or doctor's office. However, your doctor may teach you how to inject yourself. Follow all instructions. Talk to your pediatrician regarding the use of this medicine in children. Special care may be needed. Overdosage: If you think you have taken too much of this medicine contact a poison control center or emergency room at once. NOTE: This medicine is only for you. Do not share this medicine with others. What if I miss a dose? If you are given your dose at a clinic or doctor's office, call to reschedule your appointment. If you give your own injections and you miss a dose, take it as soon as you can. If it is almost time for your next dose, take only that dose. Do not take double or extra doses. What may interact with this medicine? -colchicine -heavy alcohol intake This list may not describe all possible  interactions. Give your health care provider a list of all the medicines, herbs, non-prescription drugs, or dietary supplements you use. Also tell them if you smoke, drink alcohol, or use illegal drugs. Some items may interact with your medicine. What should I watch for while using this medicine? Visit your doctor or health care professional regularly. You may need blood work done while you are taking this medicine. You may need to follow a special diet. Talk to your doctor. Limit your alcohol intake and avoid smoking to get the best benefit. What side effects may I notice from receiving this medicine? Side effects that you should report to your doctor or health care professional as soon as possible: -allergic reactions like skin rash, itching or hives, swelling of the face, lips, or tongue -blue tint to skin -chest tightness, pain -difficulty breathing, wheezing -dizziness -red, swollen painful area on the leg Side effects that usually do not require medical attention (report to your doctor or health care professional if they continue or are bothersome): -diarrhea -headache This list may not describe all possible side effects. Call your doctor for medical advice about side effects. You may report side effects to FDA at 1-800-FDA-1088. Where should I keep my medicine? Keep out of the reach of children. Store at room temperature between 15 and 30 degrees C (59 and 85 degrees F). Protect from light. Throw away any unused medicine after the expiration date. NOTE: This sheet is a summary. It may not cover all possible information. If you have questions about this medicine, talk to your doctor, pharmacist, or   health care provider.  2018 Elsevier/Gold Standard (2007-06-22 22:10:20) Darbepoetin Alfa injection What is this medicine? DARBEPOETIN ALFA (dar be POE e tin AL fa) helps your body make more red blood cells. It is used to treat anemia caused by chronic kidney failure and chemotherapy. This  medicine may be used for other purposes; ask your health care provider or pharmacist if you have questions. What should I tell my health care provider before I take this medicine? They need to know if you have any of these conditions: -blood clotting disorders or history of blood clots -cancer patient not on chemotherapy -cystic fibrosis -heart disease, such as angina, heart failure, or a history of a heart attack -hemoglobin level of 12 g/dL or greater -high blood pressure -low levels of folate, iron, or vitamin B12 -seizures -an unusual or allergic reaction to darbepoetin, erythropoietin, albumin, hamster proteins, latex, other medicines, foods, dyes, or preservatives -pregnant or trying to get pregnant -breast-feeding How should I use this medicine? This medicine is for injection into a vein or under the skin. It is usually given by a health care professional in a hospital or clinic setting. If you get this medicine at home, you will be taught how to prepare and give this medicine. Do not shake the solution before you withdraw a dose. Use exactly as directed. Take your medicine at regular intervals. Do not take your medicine more often than directed. It is important that you put your used needles and syringes in a special sharps container. Do not put them in a trash can. If you do not have a sharps container, call your pharmacist or healthcare provider to get one. Talk to your pediatrician regarding the use of this medicine in children. While this medicine may be used in children as young as 1 year for selected conditions, precautions do apply. Overdosage: If you think you have taken too much of this medicine contact a poison control center or emergency room at once. NOTE: This medicine is only for you. Do not share this medicine with others. What if I miss a dose? If you miss a dose, take it as soon as you can. If it is almost time for your next dose, take only that dose. Do not take double  or extra doses. What may interact with this medicine? Do not take this medicine with any of the following medications: -epoetin alfa This list may not describe all possible interactions. Give your health care provider a list of all the medicines, herbs, non-prescription drugs, or dietary supplements you use. Also tell them if you smoke, drink alcohol, or use illegal drugs. Some items may interact with your medicine. What should I watch for while using this medicine? Visit your prescriber or health care professional for regular checks on your progress and for the needed blood tests and blood pressure measurements. It is especially important for the doctor to make sure your hemoglobin level is in the desired range, to limit the risk of potential side effects and to give you the best benefit. Keep all appointments for any recommended tests. Check your blood pressure as directed. Ask your doctor what your blood pressure should be and when you should contact him or her. As your body makes more red blood cells, you may need to take iron, folic acid, or vitamin B supplements. Ask your doctor or health care provider which products are right for you. If you have kidney disease continue dietary restrictions, even though this medication can make you feel better.  Talk with your doctor or health care professional about the foods you eat and the vitamins that you take. What side effects may I notice from receiving this medicine? Side effects that you should report to your doctor or health care professional as soon as possible: -allergic reactions like skin rash, itching or hives, swelling of the face, lips, or tongue -breathing problems -changes in vision -chest pain -confusion, trouble speaking or understanding -feeling faint or lightheaded, falls -high blood pressure -muscle aches or pains -pain, swelling, warmth in the leg -rapid weight gain -severe headaches -sudden numbness or weakness of the face, arm  or leg -trouble walking, dizziness, loss of balance or coordination -seizures (convulsions) -swelling of the ankles, feet, hands -unusually weak or tired Side effects that usually do not require medical attention (report to your doctor or health care professional if they continue or are bothersome): -diarrhea -fever, chills (flu-like symptoms) -headaches -nausea, vomiting -redness, stinging, or swelling at site where injected This list may not describe all possible side effects. Call your doctor for medical advice about side effects. You may report side effects to FDA at 1-800-FDA-1088. Where should I keep my medicine? Keep out of the reach of children. Store in a refrigerator between 2 and 8 degrees C (36 and 46 degrees F). Do not freeze. Do not shake. Throw away any unused portion if using a single-dose vial. Throw away any unused medicine after the expiration date. NOTE: This sheet is a summary. It may not cover all possible information. If you have questions about this medicine, talk to your doctor, pharmacist, or health care provider.    2016, Elsevier/Gold Standard. (2008-02-23 10:23:57)

## 2017-02-21 ENCOUNTER — Encounter: Payer: Self-pay | Admitting: Hematology and Oncology

## 2017-02-21 NOTE — Assessment & Plan Note (Signed)
Recent blood work revealed significant iron deficiency anemia The patient is a vegetarian and I think that is likely dietary related I recommend B12 injection once a week for 4 weeks After that, I recommend high-dose oral vitamin B12 replacement therapy I plan to recheck serum vitamin B12 again next year

## 2017-02-21 NOTE — Assessment & Plan Note (Signed)
The patient was evaluated by general surgery recently and has been referred to the breast center for evaluation.-

## 2017-02-21 NOTE — Assessment & Plan Note (Signed)
We discussed some of the risks, benefits, and alternatives of erythropoietin stimulating agents such as Procrit or Aranesp.  She tolerated injections well  She will require darbepoetin injection today She will have blood checked on a monthly basis The goal is to keep her hemoglobin greater than 11

## 2017-02-21 NOTE — Progress Notes (Signed)
[facility] OFFICE PROGRESS NOTE  [name], MD SUMMARY OF HEMATOLOGIC HISTORY:  This is a patient that has been followed here since 2011 for chronic anemia In 2011, she had a bone marrow aspirate and biopsy that came back normal. The patient has been receiving Aranesp injection for chronic anemia up until 2014. It was subsequently discontinued due stability of the anemia. Starting 05/27/2014, darbepoetin was resumed at 200 g every other month to keep hemoglobin greater than 10 g. Treatment was discontinued in October 2016 and resumed in September 2017 The patient is subsequently found to have severe vitamin B12 deficiency and is started on vitamin B12 injection on December 25, 2016 INTERVAL HISTORY: Da Abigail Hoover 71 y.o. female returns for further follow-up. She complained of mild fatigue She had recent evaluation and was referred to the breast center  She denies recent infection The patient denies any recent signs or symptoms of bleeding such as spontaneous epistaxis, hematuria or hematochezia.   I have reviewed the past medical history, past surgical history, social history and family history with the patient and they are unchanged from previous note.  ALLERGIES:  is allergic to atorvastatin and ezetimibe.  MEDICATIONS:  Current Outpatient Medications  Medication Sig Dispense Refill  . acarbose (PRECOSE) 50 MG tablet Take 1 tablet at lunch and 11/2 tablet at dinner 75 tablet 4  . amLODipine (NORVASC) 10 MG tablet Take 10 mg by mouth daily.    Marland Kitchen aspirin 81 MG EC tablet Take 1 tablet (81 mg total) by mouth 2 (two) times daily with a meal. For 6 weeks    . DEXILANT 60 MG capsule     . gabapentin (NEURONTIN) 300 MG capsule Take 300 mg by mouth 4 (four) times daily.    Marland Kitchen glucose blood (ONETOUCH VERIO) test strip Use as instructed 100 each 12  . ibandronate (BONIVA) 150 MG tablet     . JANUMET 50-500 MG tablet TAKE ONE TABLET BY MOUTH TWICE DAILY WITH A MEAL 180 tablet  2  . Lancets (ONETOUCH ULTRASOFT) lancets Use as instructed 100 each 12  . Lancets Misc. (ONE TOUCH SURESOFT) MISC Use to check blood sugars 1 each 3  . magnesium oxide (MAG-OX) 400 MG tablet Take 400 mg by mouth 2 (two) times daily.    . metoprolol succinate (TOPROL-XL) 50 MG 24 hr tablet Take 1 tablet (50 mg total) by mouth daily. Take with or immediately following a meal. 30 tablet 3  . MYRBETRIQ 50 MG TB24 tablet Take 50 mg by mouth daily.  3  . nitroGLYCERIN (NITROSTAT) 0.4 MG SL tablet Place 1 tablet (0.4 mg total) under the tongue every 5 (five) minutes x 3 doses as needed for chest pain. 25 tablet 12  . pentosan polysulfate (ELMIRON) 100 MG capsule Take 100 mg by mouth 3 (three) times daily before meals.     . pravastatin (PRAVACHOL) 20 MG tablet TAKE ONE TABLET BY MOUTH ONCE DAILY 90 tablet 1  . SYNTHROID 75 MCG tablet TAKE ONE TABLET BY MOUTH ONCE DAILY 90 tablet 1   No current facility-administered medications for this visit.      REVIEW OF SYSTEMS:   Constitutional: Denies fevers, chills or night sweats Eyes: Denies blurriness of vision Ears, nose, mouth, throat, and face: Denies mucositis or sore throat Respiratory: Denies cough, dyspnea or wheezes Cardiovascular: Denies palpitation, chest discomfort or lower extremity swelling Gastrointestinal:  Denies nausea, heartburn or change in bowel habits Skin: Denies abnormal skin rashes Lymphatics: Denies new lymphadenopathy  or easy bruising Neurological:Denies numbness, tingling or new weaknesses Behavioral/Psych: Mood is stable, no new changes  All other systems were reviewed with the patient and are negative.  PHYSICAL EXAMINATION: ECOG PERFORMANCE STATUS: 0 - Asymptomatic  Vitals:   02/20/17 1058  BP: 131/65  Pulse: 67  Resp: 18  Temp: 97.6 F (36.4 C)  SpO2: 95%   Filed Weights   02/20/17 1058  Weight: 114 lb (51.7 kg)    GENERAL:alert, no distress and comfortable SKIN: skin color, texture, turgor are normal,  no rashes or significant lesions EYES: normal, Conjunctiva are pink and non-injected, sclera clear OROPHARYNX:no exudate, no erythema and lips, buccal mucosa, and tongue normal  NECK: supple, thyroid normal size, non-tender, without nodularity LYMPH:  no palpable lymphadenopathy in the cervical, axillary or inguinal LUNGS: clear to auscultation and percussion with normal breathing effort HEART: regular rate & rhythm and no murmurs and no lower extremity edema ABDOMEN:abdomen soft, non-tender and normal bowel sounds Musculoskeletal:no cyanosis of digits and no clubbing  NEURO: alert & oriented x 3 with fluent speech, no focal motor/sensory deficits  LABORATORY DATA:  I have reviewed the data as listed     Component Value Date/Time   NA 134 (L) 12/20/2016 1342   NA 138 12/12/2016 1150   K 4.6 12/20/2016 1342   K 4.6 12/12/2016 1150   CL 98 12/20/2016 1342   CL 103 07/27/2012 1259   CO2 30 12/20/2016 1342   CO2 31 (H) 12/12/2016 1150   GLUCOSE 103 (H) 12/20/2016 1342   GLUCOSE 149 (H) 12/12/2016 1150   GLUCOSE 118 (H) 07/27/2012 1259   BUN 29 (H) 12/20/2016 1342   BUN 23.4 12/12/2016 1150   CREATININE 1.58 (H) 12/20/2016 1342   CREATININE 1.7 (H) 12/12/2016 1150   CALCIUM 10.0 12/20/2016 1342   CALCIUM 11.4 (H) 12/12/2016 1150   PROT 6.5 12/12/2016 1150   ALBUMIN 3.9 12/12/2016 1150   AST 19 12/12/2016 1150   ALT 10 12/12/2016 1150   ALKPHOS 65 12/12/2016 1150   BILITOT 0.36 12/12/2016 1150   GFRNONAA 52 (L) 03/04/2016 0528   GFRAA >60 03/04/2016 0528    No results found for: SPEP, UPEP  Lab Results  Component Value Date   WBC 5.1 02/20/2017   NEUTROABS 3.0 02/20/2017   HGB 8.5 (L) 02/20/2017   HCT 27.0 (L) 02/20/2017   MCV 82.5 02/20/2017   PLT 206 02/20/2017      Chemistry      Component Value Date/Time   NA 134 (L) 12/20/2016 1342   NA 138 12/12/2016 1150   K 4.6 12/20/2016 1342   K 4.6 12/12/2016 1150   CL 98 12/20/2016 1342   CL 103 07/27/2012 1259    CO2 30 12/20/2016 1342   CO2 31 (H) 12/12/2016 1150   BUN 29 (H) 12/20/2016 1342   BUN 23.4 12/12/2016 1150   CREATININE 1.58 (H) 12/20/2016 1342   CREATININE 1.7 (H) 12/12/2016 1150      Component Value Date/Time   CALCIUM 10.0 12/20/2016 1342   CALCIUM 11.4 (H) 12/12/2016 1150   ALKPHOS 65 12/12/2016 1150   AST 19 12/12/2016 1150   ALT 10 12/12/2016 1150   BILITOT 0.36 12/12/2016 1150       ASSESSMENT & PLAN:  Vitamin B 12 deficiency Recent blood work revealed significant iron deficiency anemia The patient is a vegetarian and I think that is likely dietary related I recommend B12 injection once a week for 4 weeks After that, I recommend high-dose  oral vitamin B12 replacement therapy I plan to recheck serum vitamin B12 again next year  Anemia in chronic kidney disease We discussed some of the risks, benefits, and alternatives of erythropoietin stimulating agents such as Procrit or Aranesp.  She tolerated injections well  She will require darbepoetin injection today She will have blood checked on a monthly basis The goal is to keep her hemoglobin greater than 11   Chronic kidney disease, stage III (moderate) This is likely related to aging, chronic type 2 diabetes and hypertension. Continue risk factor modification.  Family history of breast cancer The patient was evaluated by general surgery recently and has been referred to the breast center for evaluation.-   Orders Placed This Encounter  Procedures  . Vitamin B12    Standing Status:   Future    Standing Expiration Date:   03/27/2018  . CBC with Differential/Platelet    Standing Status:   Future    Standing Expiration Date:   03/27/2018    All questions were answered. The patient knows to call the clinic with any problems, questions or concerns. No barriers to learning was detected.  I spent 15 minutes counseling the patient face to face. The total time spent in the appointment was 20 minutes and more than 50% was  on counseling.     [name], MD 11/30/201812:54 PM

## 2017-02-21 NOTE — Assessment & Plan Note (Signed)
This is likely related to aging, chronic type 2 diabetes and hypertension. Continue risk factor modification.

## 2017-02-25 ENCOUNTER — Ambulatory Visit: Payer: Medicare Other | Attending: Internal Medicine | Admitting: Physical Therapy

## 2017-02-25 ENCOUNTER — Encounter: Payer: Self-pay | Admitting: Physical Therapy

## 2017-02-25 DIAGNOSIS — M25652 Stiffness of left hip, not elsewhere classified: Secondary | ICD-10-CM

## 2017-02-25 DIAGNOSIS — M25552 Pain in left hip: Secondary | ICD-10-CM

## 2017-02-25 DIAGNOSIS — R2689 Other abnormalities of gait and mobility: Secondary | ICD-10-CM | POA: Diagnosis not present

## 2017-02-25 NOTE — Addendum Note (Signed)
Addended by: Raeford Razor L on: 02/25/2017 04:21 PM   Modules accepted: Orders

## 2017-02-25 NOTE — Therapy (Signed)
[place], [place], [address] Phone: [phone]   Fax:  [phone]  Physical Therapy Treatment  Patient Details  Name: Abigail Hoover MRN: [id number] Date of Birth: 1945-06-12 Referring Provider: Dr. Rod Can   Encounter Date: 02/25/2017  PT End of Session - 02/25/17 1119    Visit Number  13    Number of Visits  18    Authorization Type  Medicare/BCBS - G codes and Progress Notes    PT Start Time  1103    PT Stop Time  1155    PT Time Calculation (min)  52 min    Activity Tolerance  Patient tolerated treatment well    Behavior During Therapy  [facility] for tasks assessed/performed       Past Medical History:  Diagnosis Date  . 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 Anxiety   . Arthritis   . Bladder irritation   . Diabetes mellitus   . GERD (gastroesophageal reflux disease)   . Hypercholesterolemia   . Hypertension   . Hypothyroidism   . Iron deficiency anemia, unspecified   . Psoriasis   . Transfusion history 03/05/2016   for postoperative (ORIF) anemia superimposed on chronic anemia    Past Surgical History:  Procedure Laterality Date  . ABDOMINAL HYSTERECTOMY     TAH  . BREAST BIOPSY     Left breast biopsy benign lesion  . BREAST BIOPSY  03/11/2012   Procedure: BREAST BIOPSY;  Surgeon: Odis Hollingshead, MD;  Location: [facility];  Service: General;  Laterality: Left;  remove left breast mass  . CARDIAC CATHETERIZATION N/A 01/02/2015   Procedure: Left Heart Cath and Coronary Angiography;  Surgeon: Charolette Forward, MD;  Location: [facility];  Service: Cardiovascular;  Laterality: N/A;  . CATARACT EXTRACTION    . CERVICAL DISC SURGERY    . COLONOSCOPY  2013   neg. next 2023.   Marland Kitchen EYE SURGERY    . LEFT HIP SURGERY    . TOTAL HIP ARTHROPLASTY Left 03/02/2016   Procedure: TOTAL HIP ARTHROPLASTY ANTERIOR APPROACH;  Surgeon: Rod Can, MD;  Location: [facility];  Service:  Orthopedics;  Laterality: Left;    There were no vitals filed for this visit.  Subjective Assessment - 02/25/17 1113    Subjective  Rt. thigh hurts a little.  Patient came today, PT thought she may be discharged.  There may have been a miscommunication.  She thinks she can continue on her own.        Currently in Pain?  Yes    Pain Score  3     Pain Location  Leg    Pain Orientation  Left;Anterior    Pain Descriptors / Indicators  Sore    Pain Type  Chronic pain    Pain Onset  More than a month ago    Pain Frequency  Intermittent    Aggravating Factors   nothing really    Pain Relieving Factors  moving it, stretching it          [facility] PT Assessment - 02/25/17 0001      Strength   Right Hip Flexion  4/5    Left Hip Flexion  3+/5    Left Hip ABduction  4/5    Right Knee Flexion  4/5    Right Knee Extension  4/5    Left Knee Flexion  4/5    Left Knee Extension  4/5  [place] Adult PT Treatment/Exercise - 02/25/17 0001      Knee/Hip Exercises: Stretches   Active Hamstring Stretch  Left;5 reps;10 seconds knee ext from knee to chest       Knee/Hip Exercises: Aerobic   Nustep  UE and LE for 7 min L5 , strength       Knee/Hip Exercises: Seated   Long Arc Quad  Strengthening;Both;1 set;20 reps    Sit to [employer]  1 set;15 reps;without UE support      Knee/Hip Exercises: Supine   Bridges Limitations  x10     Straight Leg Raises  Strengthening;Both;1 set;10 reps    Straight Leg Raises Limitations  cues for full ext of knee     Other Supine Knee/Hip Exercises  knee to chest x 2 , 30 sec       Knee/Hip Exercises: Sidelying   Hip ABduction  Strengthening;Both;1 set;10 reps      Modalities   Modalities  Moist Heat      Moist Heat Therapy   Number Minutes Moist Heat  8 Minutes    Moist Heat Location  Hip      Manual Therapy   Manual therapy comments  IASTM to Lt thigh , quads mod pressure , no pain              PT Education - 02/25/17 1118    Education  provided  Yes    Education Details  HEP full review , how to make a rice bag for moist heat, group Ex     Person(s) Educated  Patient    Methods  Demonstration;Explanation    Comprehension  Verbalized understanding;Returned demonstration          PT Long Term Goals - 02/25/17 1119      PT LONG TERM GOAL #1   Title  independent with HEP    Status  Achieved      PT LONG TERM GOAL #2   Title  demonstrate 4/5 strength Lt hip for improved functional mobility    Baseline  4/5     Status  Achieved      PT LONG TERM GOAL #3   Title  report ability to walk > 30 min with min increase in pain for improved function    Status  Not Met      PT LONG TERM GOAL #4   Title  report pain < 3/10 with standing 1 hour to cook    Baseline  somedays she can do this     Status  Partially Met            Plan - 02/25/17 1207    Clinical Impression Statement  Patient may have misunderstoof last appt with PT Burnis Medin.  She did not know she was going to be discharged but after discussion, she came to terms with the fact that she needs to be independent and there will be good days and bad days without respect to her mobility.  She is limited in transportation, resources.  She knows her HEP but I made sure she knew how exactly to perform some of them.      PT Next Visit Plan  DC to HEP     PT Home Exercise Plan  sit to stand, ITB and piriformis, bridging, clam , SLR in abd and flex     Consulted and Agree with Plan of Care  Patient       Patient will benefit from skilled therapeutic intervention in order  to improve the following deficits and impairments:  Abnormal gait, Impaired flexibility, Decreased range of motion, Difficulty walking, Decreased mobility, Decreased strength, Increased fascial restricitons, Increased muscle spasms, Pain  Visit Diagnosis: Pain in left hip  Stiffness of left hip, not elsewhere classified  Other abnormalities of gait and mobility   G-Codes - 2017-03-21 1210     Functional Assessment Tool Used (Outpatient Only)  clinical judgement    Functional Limitation  Mobility: Walking and moving around    Mobility: Walking and Moving Around Current Status [phone])  At least 20 percent but less than 40 percent impaired, limited or restricted    Mobility: Walking and Moving Around Goal Status [phone])  At least 1 percent but less than 20 percent impaired, limited or restricted    Mobility: Walking and Moving Around Discharge Status [phone])  At least 20 percent but less than 40 percent impaired, limited or restricted       Problem List Patient Active Problem List   Diagnosis Date Noted  . Hypercalcemia 12/16/2016  . Vitamin B 12 deficiency 12/16/2016  . Displaced fracture of left femoral neck ([place]) 03/02/2016  . Hyperkalemia 03/01/2016  . Femoral neck fracture ([place]) 02/29/2016  . Anemia due to stage 3 (moderate) chronic renal failure treated with erythropoietin ([place]) 12/18/2015  . Chronic kidney disease, stage III (moderate) ([place]) 08/14/2015  . 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 Acute coronary syndrome ([place]) 01/02/2015  . Deficiency anemia 05/27/2014  . Carpal tunnel syndrome 12/09/2013  . Diabetic neuropathy ([place]) 12/09/2013  . Type 2 diabetes mellitus with neurological manifestations, controlled ([place]) 04/21/2013  . Disturbance of skin sensation 04/21/2013  . Hypothyroidism 10/30/2012  . Breast mass in female-left 03/10/2012  . Weight loss 11/28/2011  . Family history of breast cancer 08/01/2010  . Essential hypertension 02/01/2009  . EDEMA LEGS 02/01/2009    Kaysi Ourada 21-Mar-2017, 12:13 PM  [facility] [address] [place], [place], [address] Phone: [phone]   Fax:  [phone]  Name: Abigail Hoover MRN: [id number] Date of Birth: [birth year]  PHYSICAL THERAPY DISCHARGE SUMMARY  Visits from Start of Care: 13  Current functional level related to goals / functional  outcomes: See above for most recent info , pain is intermittent varies.  Stronger in LEs    Remaining deficits: Strength, posture, endurance   Education / Equipment: HEP, self care, posture  Plan: Patient agrees to discharge.  Patient goals were partially met. Patient is being discharged due to being pleased with the current functional level.  ?????     Raeford Razor, PT 03/21/17 12:17 PM Phone: [phone] Fax: [phone]

## 2017-02-26 ENCOUNTER — Telehealth: Payer: Self-pay | Admitting: Hematology and Oncology

## 2017-02-26 DIAGNOSIS — N2581 Secondary hyperparathyroidism of renal origin: Secondary | ICD-10-CM | POA: Diagnosis not present

## 2017-02-26 DIAGNOSIS — N183 Chronic kidney disease, stage 3 (moderate): Secondary | ICD-10-CM | POA: Diagnosis not present

## 2017-02-26 DIAGNOSIS — N649 Disorder of breast, unspecified: Secondary | ICD-10-CM | POA: Diagnosis not present

## 2017-02-26 DIAGNOSIS — Z9189 Other specified personal risk factors, not elsewhere classified: Secondary | ICD-10-CM | POA: Diagnosis not present

## 2017-02-26 DIAGNOSIS — D509 Iron deficiency anemia, unspecified: Secondary | ICD-10-CM | POA: Diagnosis not present

## 2017-02-26 DIAGNOSIS — I129 Hypertensive chronic kidney disease with stage 1 through stage 4 chronic kidney disease, or unspecified chronic kidney disease: Secondary | ICD-10-CM | POA: Diagnosis not present

## 2017-02-26 NOTE — Telephone Encounter (Signed)
Appt has been scheduled for the pt to see Dr. Lindi Adie on 12/13 at 1pm. Pt has agreed to the appt date and time.

## 2017-02-27 ENCOUNTER — Encounter: Payer: Medicare Other | Admitting: Physical Therapy

## 2017-03-03 ENCOUNTER — Encounter: Payer: Medicare Other | Admitting: Physical Therapy

## 2017-03-05 ENCOUNTER — Encounter: Payer: Medicare Other | Admitting: Physical Therapy

## 2017-03-05 ENCOUNTER — Telehealth: Payer: Self-pay | Admitting: *Deleted

## 2017-03-05 NOTE — Telephone Encounter (Signed)
Stacey from Dr Serita Grit office left a message to see if patient had received  Feraheme. States "Dr Posey Pronto had ordered 1st dose to be given @ [place] [facility] on 12/10 along with B-12 injection."  Called Dr Serita Grit office - left message to call us for clarification.

## 2017-03-05 NOTE — Telephone Encounter (Signed)
I am not aware she needs IV iron Please let me know once we have further info from his office

## 2017-03-06 ENCOUNTER — Ambulatory Visit (HOSPITAL_BASED_OUTPATIENT_CLINIC_OR_DEPARTMENT_OTHER): Payer: Medicare Other

## 2017-03-06 ENCOUNTER — Ambulatory Visit (HOSPITAL_BASED_OUTPATIENT_CLINIC_OR_DEPARTMENT_OTHER): Payer: Medicare Other | Admitting: Hematology and Oncology

## 2017-03-06 ENCOUNTER — Telehealth: Payer: Self-pay | Admitting: Hematology and Oncology

## 2017-03-06 DIAGNOSIS — N183 Chronic kidney disease, stage 3 unspecified: Secondary | ICD-10-CM

## 2017-03-06 DIAGNOSIS — D631 Anemia in chronic kidney disease: Secondary | ICD-10-CM | POA: Diagnosis not present

## 2017-03-06 DIAGNOSIS — N184 Chronic kidney disease, stage 4 (severe): Secondary | ICD-10-CM

## 2017-03-06 DIAGNOSIS — E538 Deficiency of other specified B group vitamins: Secondary | ICD-10-CM

## 2017-03-06 DIAGNOSIS — L308 Other specified dermatitis: Secondary | ICD-10-CM | POA: Diagnosis not present

## 2017-03-06 DIAGNOSIS — Z9189 Other specified personal risk factors, not elsewhere classified: Secondary | ICD-10-CM | POA: Insufficient documentation

## 2017-03-06 DIAGNOSIS — N189 Chronic kidney disease, unspecified: Secondary | ICD-10-CM

## 2017-03-06 LAB — CBC WITH DIFFERENTIAL/PLATELET
BASO%: 0.2 % (ref 0.0–2.0)
Basophils Absolute: 0 10*3/uL (ref 0.0–0.1)
EOS%: 9.4 % — ABNORMAL HIGH (ref 0.0–7.0)
Eosinophils Absolute: 0.6 10*3/uL — ABNORMAL HIGH (ref 0.0–0.5)
HCT: 33.7 % — ABNORMAL LOW (ref 34.8–46.6)
HGB: 10.3 g/dL — ABNORMAL LOW (ref 11.6–15.9)
LYMPH%: 20.8 % (ref 14.0–49.7)
MCH: 25.8 pg (ref 25.1–34.0)
MCHC: 30.6 g/dL — ABNORMAL LOW (ref 31.5–36.0)
MCV: 84.5 fL (ref 79.5–101.0)
MONO#: 0.4 10*3/uL (ref 0.1–0.9)
MONO%: 6.6 % (ref 0.0–14.0)
NEUT#: 3.8 10*3/uL (ref 1.5–6.5)
NEUT%: 63 % (ref 38.4–76.8)
Platelets: 285 10*3/uL (ref 145–400)
RBC: 3.99 10*6/uL (ref 3.70–5.45)
RDW: 16.1 % — ABNORMAL HIGH (ref 11.2–14.5)
WBC: 6.1 10*3/uL (ref 3.9–10.3)
lymph#: 1.3 10*3/uL (ref 0.9–3.3)

## 2017-03-06 MED ORDER — DARBEPOETIN ALFA 200 MCG/0.4ML IJ SOSY
200.0000 ug | PREFILLED_SYRINGE | Freq: Once | INTRAMUSCULAR | Status: AC
  Administered 2017-03-06: 200 ug via SUBCUTANEOUS

## 2017-03-06 NOTE — Progress Notes (Signed)
[facility] NOTE  Patient Care Team: [name], MD as PCP - General (Internal Medicine) Elayne Snare, MD as Attending Physician (Endocrinology) Jackolyn Confer, MD as Attending Physician (General Surgery) Jovita Gamma, MD as Attending Physician (Neurosurgery) [name], MD as Consulting Physician (Hematology and Oncology)  CHIEF COMPLAINTS/PURPOSE OF CONSULTATION:  High risk breast cancer evaluation  HISTORY OF PRESENTING ILLNESS:  Abigail Hoover 71 y.o. female is here because of recent diagnosis of left breast spongiotic dermatitis.  She has a sister who was diagnosed with breast cancer around 71 years who died from breast cancer.  She was seeing Dr. Zella Richer who has been monitoring her nipple discharge and has recently performed a skin biopsy.  Biopsy came back as spongiotic dermatitis and no evidence of malignancy.  She gets mammograms once a year in June.  She denies any pain or discomfort in the breast.  She is anxious about the nipple discharge but it does happen. She follows with Dr. Simeon Craft such for her anemia issues.  I reviewed her records extensively and collaborated the history with the patient.  MEDICAL HISTORY:  Past Medical History:  Diagnosis Date  . 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 Anxiety   . Arthritis   . Bladder irritation   . Diabetes mellitus   . GERD (gastroesophageal reflux disease)   . Hypercholesterolemia   . Hypertension   . Hypothyroidism   . Iron deficiency anemia, unspecified   . Psoriasis   . Transfusion history 03/05/2016   for postoperative (ORIF) anemia superimposed on chronic anemia    SURGICAL HISTORY: Past Surgical History:  Procedure Laterality Date  . ABDOMINAL HYSTERECTOMY     TAH  . BREAST BIOPSY     Left breast biopsy benign lesion  . BREAST BIOPSY  03/11/2012   Procedure: BREAST BIOPSY;  Surgeon: Odis Hollingshead, MD;  Location: [facility];  Service: General;  Laterality: Left;  remove left  breast mass  . CARDIAC CATHETERIZATION N/A 01/02/2015   Procedure: Left Heart Cath and Coronary Angiography;  Surgeon: Charolette Forward, MD;  Location: [facility];  Service: Cardiovascular;  Laterality: N/A;  . CATARACT EXTRACTION    . CERVICAL DISC SURGERY    . COLONOSCOPY  2013   neg. next 2023.   Marland Kitchen EYE SURGERY    . LEFT HIP SURGERY    . TOTAL HIP ARTHROPLASTY Left 03/02/2016   Procedure: TOTAL HIP ARTHROPLASTY ANTERIOR APPROACH;  Surgeon: Rod Can, MD;  Location: [facility];  Service: Orthopedics;  Laterality: Left;    SOCIAL HISTORY: Social History   Socioeconomic History  . Marital status: Married    Spouse name: Not on file  . Number of children: 1  . Years of education: college  . Highest education level: Not on file  Social Needs  . Financial resource strain: Not on file  . Food insecurity - worry: Not on file  . Food insecurity - inability: Not on file  . Transportation needs - medical: Not on file  . Transportation needs - non-medical: Not on file  Occupational History  . Occupation: Homemaker  Tobacco Use  . Smoking status: Never Smoker  . Smokeless tobacco: Never Used  Substance and Sexual Activity  . Alcohol use: No    Alcohol/week: 0.0 oz  . Drug use: No  . Sexual activity: Not Currently  Other Topics Concern  . Not on file  Social History Narrative   Patient is married with one child.   Patient is  right handed.   Patient has college education.   Patient drinks 1 cup daily.    FAMILY HISTORY: Family History  Problem Relation Age of Onset  . Heart disease Mother        heart attack  . Stroke Father        brain hemorrhage  . Diabetes Brother   . Cancer Brother   . Breast cancer Sister   . Cancer Sister        breast cancer    ALLERGIES:  is allergic to atorvastatin and ezetimibe.  MEDICATIONS:  Current Outpatient Medications  Medication Sig Dispense Refill  . acarbose (PRECOSE) 50 MG tablet Take 1 tablet at lunch and 11/2 tablet at  dinner 75 tablet 4  . amLODipine (NORVASC) 10 MG tablet Take 10 mg by mouth daily.    Marland Kitchen aspirin 81 MG EC tablet Take 1 tablet (81 mg total) by mouth 2 (two) times daily with a meal. For 6 weeks    . DEXILANT 60 MG capsule     . gabapentin (NEURONTIN) 300 MG capsule Take 300 mg by mouth 4 (four) times daily.    Marland Kitchen glucose blood (ONETOUCH VERIO) test strip Use as instructed 100 each 12  . ibandronate (BONIVA) 150 MG tablet     . JANUMET 50-500 MG tablet TAKE ONE TABLET BY MOUTH TWICE DAILY WITH A MEAL 180 tablet 2  . Lancets (ONETOUCH ULTRASOFT) lancets Use as instructed 100 each 12  . Lancets Misc. (ONE TOUCH SURESOFT) MISC Use to check blood sugars 1 each 3  . magnesium oxide (MAG-OX) 400 MG tablet Take 400 mg by mouth 2 (two) times daily.    . metoprolol succinate (TOPROL-XL) 50 MG 24 hr tablet Take 1 tablet (50 mg total) by mouth daily. Take with or immediately following a meal. 30 tablet 3  . MYRBETRIQ 50 MG TB24 tablet Take 50 mg by mouth daily.  3  . nitroGLYCERIN (NITROSTAT) 0.4 MG SL tablet Place 1 tablet (0.4 mg total) under the tongue every 5 (five) minutes x 3 doses as needed for chest pain. 25 tablet 12  . pentosan polysulfate (ELMIRON) 100 MG capsule Take 100 mg by mouth 3 (three) times daily before meals.     . pravastatin (PRAVACHOL) 20 MG tablet TAKE ONE TABLET BY MOUTH ONCE DAILY 90 tablet 1  . SYNTHROID 75 MCG tablet TAKE ONE TABLET BY MOUTH ONCE DAILY 90 tablet 1   No current facility-administered medications for this visit.     REVIEW OF SYSTEMS:   Constitutional: Denies fevers, chills or abnormal night sweats Eyes: Denies blurriness of vision, double vision or watery eyes Ears, nose, mouth, throat, and face: Denies mucositis or sore throat Respiratory: Denies cough, dyspnea or wheezes Cardiovascular: Denies palpitation, chest discomfort or lower extremity swelling Gastrointestinal:  Denies nausea, heartburn or change in bowel habits Skin: Denies abnormal skin  rashes Lymphatics: Denies new lymphadenopathy or easy bruising Neurological:Denies numbness, tingling or new weaknesses Behavioral/Psych: Mood is stable, no new changes  Breast: Left breast nipple discharge intermittently All other systems were reviewed with the patient and are negative.  PHYSICAL EXAMINATION: ECOG PERFORMANCE STATUS: 1 - Symptomatic but completely ambulatory  Vitals:   03/06/17 1301  BP: (!) 133/49  Pulse: 65  Resp: 17  Temp: (!) 97.4 F (36.3 C)  SpO2: 100%   Filed Weights   03/06/17 1301  Weight: 110 lb 4.8 oz (50 kg)    GENERAL:alert, no distress and comfortable SKIN: skin color, texture, turgor  are normal, no rashes or significant lesions EYES: normal, conjunctiva are pink and non-injected, sclera clear OROPHARYNX:no exudate, no erythema and lips, buccal mucosa, and tongue normal  NECK: supple, thyroid normal size, non-tender, without nodularity LYMPH:  no palpable lymphadenopathy in the cervical, axillary or inguinal LUNGS: clear to auscultation and percussion with normal breathing effort HEART: regular rate & rhythm and no murmurs and no lower extremity edema ABDOMEN:abdomen soft, non-tender and normal bowel sounds Musculoskeletal:no cyanosis of digits and no clubbing  PSYCH: alert & oriented x 3 with fluent speech NEURO: no focal motor/sensory deficits BREAST: No evidence of any nipple discharge at this time.  No palpable lumps or nodules.  No palpable axillary or supraclavicular lymphadenopathy (exam performed in the presence of a chaperone)   LABORATORY DATA:  I have reviewed the data as listed Lab Results  Component Value Date   WBC 5.1 02/20/2017   HGB 8.5 (L) 02/20/2017   HCT 27.0 (L) 02/20/2017   MCV 82.5 02/20/2017   PLT 206 02/20/2017   Lab Results  Component Value Date   NA 134 (L) 12/20/2016   K 4.6 12/20/2016   CL 98 12/20/2016   CO2 30 12/20/2016    RADIOGRAPHIC STUDIES: I have personally reviewed the radiological reports  and agreed with the findings in the report.  ASSESSMENT AND PLAN:  High risk breast cancer evaluation: Based upon the American Cancer Society risk adapted tool, patient has a 4.8% lifetime risk of breast cancer and a 1.8% 5-year risk of breast cancer.  Based on these numbers, I did not believe that she has High enough risk to warrant antiestrogen therapy for prophylaxis. It is for this reason I did not recommend tamoxifen therapy.  She wanted to see me once a year at least for her breast evaluations.  I recommended that she get mammograms every June and follow-up with [place] every December.  She is agreeable and wishes to follow-up in December 2019 back to see [place]. All questions were answered. The patient knows to call the clinic with any problems, questions or concerns.    Rulon Eisenmenger, MD 03/06/17

## 2017-03-06 NOTE — Telephone Encounter (Signed)
Called back and spoke with Erline Levine, given today's Hemoglobin results. Per Dr. Alvy Bimler, after reviewing lab results faxed over from Dr. Posey Pronto she would not be able to get insurance authorization for [facility].

## 2017-03-06 NOTE — Telephone Encounter (Signed)
Called office and they will faxed over most recent lab results from November. Dr. Posey Pronto is requesting Shirlean Kelly be given at [facility].

## 2017-03-06 NOTE — Patient Instructions (Signed)

## 2017-03-06 NOTE — Telephone Encounter (Signed)
Patient declined AVs and calendar of upcoming December 2019 appointments.

## 2017-03-10 ENCOUNTER — Encounter: Payer: Medicare Other | Admitting: Physical Therapy

## 2017-03-11 ENCOUNTER — Telehealth: Payer: Self-pay | Admitting: Endocrinology

## 2017-03-11 NOTE — Telephone Encounter (Signed)
See message and please advise, Thanks!  

## 2017-03-11 NOTE — Telephone Encounter (Signed)
Please ask her exactly how often it has been over 200 and at what time of the day

## 2017-03-11 NOTE — Telephone Encounter (Signed)
B/s is running high in the 200dreds, medication acarbose (PRECOSE) 50 MG tablet [[id number]]  Is not helping her she is asking for something that will help her b/s go down. Pharmacy:  [facility], [place] #:  [id number]

## 2017-03-12 ENCOUNTER — Encounter: Payer: Medicare Other | Admitting: Physical Therapy

## 2017-03-12 NOTE — Telephone Encounter (Signed)
I contacted the patient she stated her blood sugar normal run high in the evenings and she stated it happens a couple of times a week. She stated it does not happen on a daily basis.

## 2017-03-12 NOTE — Telephone Encounter (Signed)
This is likely to be from getting more carbohydrate, she needs to increase her protein at dinnertime from dairy products, lentils, beans.  No change in medication yet However she needs to make an appointment for follow-up with labs, does not have any follow-up yet scheduled

## 2017-03-12 NOTE — Telephone Encounter (Signed)
I contacted the patient and advised of message. She voiced understanding and had no further questions at this time. Patient will call back to schedule her appointment.

## 2017-03-13 ENCOUNTER — Other Ambulatory Visit: Payer: Self-pay | Admitting: Endocrinology

## 2017-03-13 MED ORDER — ACARBOSE 50 MG PO TABS: ORAL_TABLET | ORAL | 0 refills | Status: DC

## 2017-03-13 NOTE — Telephone Encounter (Signed)
Patient is confused she do not understand, and don't remember the conversation she had with the nurse. She asked for Abigail Hoover to call her so she can talk with her in her language. Please advise

## 2017-03-13 NOTE — Telephone Encounter (Signed)
Patient has occasional high readings around 1 80-200 She refuses to increase her protein intake with dairy products, soybeans and other beans. She wants to try taking Precose 3 times a day and will send new prescription for this

## 2017-03-13 NOTE — Telephone Encounter (Signed)
Please see message.  Please advise. 

## 2017-03-17 ENCOUNTER — Encounter: Payer: Medicare Other | Admitting: Physical Therapy

## 2017-03-19 ENCOUNTER — Encounter: Payer: Medicare Other | Admitting: Physical Therapy

## 2017-03-20 ENCOUNTER — Encounter: Payer: Self-pay | Admitting: Hematology and Oncology

## 2017-03-20 DIAGNOSIS — N183 Chronic kidney disease, stage 3 (moderate): Secondary | ICD-10-CM | POA: Diagnosis not present

## 2017-03-21 ENCOUNTER — Other Ambulatory Visit (HOSPITAL_BASED_OUTPATIENT_CLINIC_OR_DEPARTMENT_OTHER): Payer: Medicare Other

## 2017-03-21 ENCOUNTER — Ambulatory Visit: Payer: Medicare Other

## 2017-03-21 ENCOUNTER — Ambulatory Visit (HOSPITAL_BASED_OUTPATIENT_CLINIC_OR_DEPARTMENT_OTHER): Payer: Medicare Other

## 2017-03-21 ENCOUNTER — Encounter (HOSPITAL_COMMUNITY): Payer: Medicare Other

## 2017-03-21 ENCOUNTER — Other Ambulatory Visit: Payer: Medicare Other

## 2017-03-21 VITALS — BP 138/70 | HR 60 | Temp 97.7°F | Resp 20

## 2017-03-21 DIAGNOSIS — N183 Chronic kidney disease, stage 3 unspecified: Secondary | ICD-10-CM

## 2017-03-21 DIAGNOSIS — D631 Anemia in chronic kidney disease: Secondary | ICD-10-CM

## 2017-03-21 DIAGNOSIS — E538 Deficiency of other specified B group vitamins: Secondary | ICD-10-CM

## 2017-03-21 DIAGNOSIS — N189 Chronic kidney disease, unspecified: Secondary | ICD-10-CM

## 2017-03-21 LAB — CBC WITH DIFFERENTIAL/PLATELET
BASO%: 0.4 % (ref 0.0–2.0)
Basophils Absolute: 0 10*3/uL (ref 0.0–0.1)
EOS%: 6.4 % (ref 0.0–7.0)
Eosinophils Absolute: 0.3 10*3/uL (ref 0.0–0.5)
HCT: 35.9 % (ref 34.8–46.6)
HGB: 10.9 g/dL — ABNORMAL LOW (ref 11.6–15.9)
LYMPH%: 23.1 % (ref 14.0–49.7)
MCH: 25.1 pg (ref 25.1–34.0)
MCHC: 30.4 g/dL — ABNORMAL LOW (ref 31.5–36.0)
MCV: 82.5 fL (ref 79.5–101.0)
MONO#: 0.3 10*3/uL (ref 0.1–0.9)
MONO%: 6.1 % (ref 0.0–14.0)
NEUT#: 3.1 10*3/uL (ref 1.5–6.5)
NEUT%: 64 % (ref 38.4–76.8)
Platelets: 288 10*3/uL (ref 145–400)
RBC: 4.34 10*6/uL (ref 3.70–5.45)
RDW: 15.7 % — ABNORMAL HIGH (ref 11.2–14.5)
WBC: 4.8 10*3/uL (ref 3.9–10.3)
lymph#: 1.1 10*3/uL (ref 0.9–3.3)

## 2017-03-21 MED ORDER — CYANOCOBALAMIN 1000 MCG/ML IJ SOLN
1000.0000 ug | Freq: Once | INTRAMUSCULAR | Status: AC
  Administered 2017-03-21: 1000 ug via INTRAMUSCULAR

## 2017-03-21 NOTE — Patient Instructions (Signed)
Cyanocobalamin, Vitamin B12 injection What is this medicine? CYANOCOBALAMIN (sye an oh koe BAL a min) is a man made form of vitamin B12. Vitamin B12 is used in the growth of healthy blood cells, nerve cells, and proteins in the body. It also helps with the metabolism of fats and carbohydrates. This medicine is used to treat people who can not absorb vitamin B12. This medicine may be used for other purposes; ask your health care provider or pharmacist if you have questions. COMMON BRAND NAME(S): B-12 Compliance Kit, B-12 Injection Kit, Cyomin, LA-12, Nutri-Twelve, Physicians EZ Use B-12, Primabalt What should I tell my health care provider before I take this medicine? They need to know if you have any of these conditions: -kidney disease -Leber's disease -megaloblastic anemia -an unusual or allergic reaction to cyanocobalamin, cobalt, other medicines, foods, dyes, or preservatives -pregnant or trying to get pregnant -breast-feeding How should I use this medicine? This medicine is injected into a muscle or deeply under the skin. It is usually given by a health care professional in a clinic or doctor's office. However, your doctor may teach you how to inject yourself. Follow all instructions. Talk to your pediatrician regarding the use of this medicine in children. Special care may be needed. Overdosage: If you think you have taken too much of this medicine contact a poison control center or emergency room at once. NOTE: This medicine is only for you. Do not share this medicine with others. What if I miss a dose? If you are given your dose at a clinic or doctor's office, call to reschedule your appointment. If you give your own injections and you miss a dose, take it as soon as you can. If it is almost time for your next dose, take only that dose. Do not take double or extra doses. What may interact with this medicine? -colchicine -heavy alcohol intake This list may not describe all possible  interactions. Give your health care provider a list of all the medicines, herbs, non-prescription drugs, or dietary supplements you use. Also tell them if you smoke, drink alcohol, or use illegal drugs. Some items may interact with your medicine. What should I watch for while using this medicine? Visit your doctor or health care professional regularly. You may need blood work done while you are taking this medicine. You may need to follow a special diet. Talk to your doctor. Limit your alcohol intake and avoid smoking to get the best benefit. What side effects may I notice from receiving this medicine? Side effects that you should report to your doctor or health care professional as soon as possible: -allergic reactions like skin rash, itching or hives, swelling of the face, lips, or tongue -blue tint to skin -chest tightness, pain -difficulty breathing, wheezing -dizziness -red, swollen painful area on the leg Side effects that usually do not require medical attention (report to your doctor or health care professional if they continue or are bothersome): -diarrhea -headache This list may not describe all possible side effects. Call your doctor for medical advice about side effects. You may report side effects to FDA at 1-800-FDA-1088. Where should I keep my medicine? Keep out of the reach of children. Store at room temperature between 15 and 30 degrees C (59 and 85 degrees F). Protect from light. Throw away any unused medicine after the expiration date. NOTE: This sheet is a summary. It may not cover all possible information. If you have questions about this medicine, talk to your doctor, pharmacist, or   health care provider.  2018 Elsevier/Gold Standard (2007-06-22 22:10:20)  

## 2017-03-22 LAB — VITAMIN B12: Vitamin B12: 553 pg/mL (ref 232–1245)

## 2017-03-26 ENCOUNTER — Other Ambulatory Visit (HOSPITAL_COMMUNITY): Payer: Self-pay | Admitting: *Deleted

## 2017-03-27 ENCOUNTER — Ambulatory Visit (HOSPITAL_COMMUNITY)
Admission: RE | Admit: 2017-03-27 | Discharge: 2017-03-27 | Disposition: A | Discharge status: Home / Self Care | Payer: Medicare Other | Source: Ambulatory Visit | Attending: Nephrology | Admitting: Nephrology

## 2017-03-27 DIAGNOSIS — D631 Anemia in chronic kidney disease: Secondary | ICD-10-CM | POA: Insufficient documentation

## 2017-03-27 DIAGNOSIS — N189 Chronic kidney disease, unspecified: Secondary | ICD-10-CM | POA: Insufficient documentation

## 2017-03-27 MED ORDER — SODIUM CHLORIDE 0.9 % IV SOLN
510.0000 mg | INTRAVENOUS | Status: DC
  Administered 2017-03-27: 510 mg via INTRAVENOUS
  Filled 2017-03-27: qty 17

## 2017-03-31 DIAGNOSIS — N760 Acute vaginitis: Secondary | ICD-10-CM | POA: Diagnosis not present

## 2017-04-04 ENCOUNTER — Ambulatory Visit (HOSPITAL_COMMUNITY)
Admission: RE | Admit: 2017-04-04 | Discharge: 2017-04-04 | Disposition: A | Discharge status: Home / Self Care | Payer: Medicare Other | Source: Ambulatory Visit | Attending: Nephrology | Admitting: Nephrology

## 2017-04-04 DIAGNOSIS — N189 Chronic kidney disease, unspecified: Secondary | ICD-10-CM | POA: Diagnosis not present

## 2017-04-04 DIAGNOSIS — D631 Anemia in chronic kidney disease: Secondary | ICD-10-CM | POA: Diagnosis not present

## 2017-04-04 MED ORDER — FERUMOXYTOL INJECTION 510 MG/17 ML
510.0000 mg | INTRAVENOUS | Status: DC
  Administered 2017-04-04: 510 mg via INTRAVENOUS
  Filled 2017-04-04: qty 17

## 2017-04-08 ENCOUNTER — Telehealth: Payer: Self-pay | Admitting: Neurology

## 2017-04-08 DIAGNOSIS — I25111 Atherosclerotic heart disease of native coronary artery with angina pectoris with documented spasm: Secondary | ICD-10-CM | POA: Diagnosis not present

## 2017-04-08 DIAGNOSIS — R0789 Other chest pain: Secondary | ICD-10-CM | POA: Diagnosis not present

## 2017-04-08 DIAGNOSIS — E119 Type 2 diabetes mellitus without complications: Secondary | ICD-10-CM | POA: Diagnosis not present

## 2017-04-08 DIAGNOSIS — E039 Hypothyroidism, unspecified: Secondary | ICD-10-CM | POA: Diagnosis not present

## 2017-04-08 DIAGNOSIS — N189 Chronic kidney disease, unspecified: Secondary | ICD-10-CM | POA: Diagnosis not present

## 2017-04-08 DIAGNOSIS — E559 Vitamin D deficiency, unspecified: Secondary | ICD-10-CM | POA: Diagnosis not present

## 2017-04-08 DIAGNOSIS — E785 Hyperlipidemia, unspecified: Secondary | ICD-10-CM | POA: Diagnosis not present

## 2017-04-08 DIAGNOSIS — I252 Old myocardial infarction: Secondary | ICD-10-CM | POA: Diagnosis not present

## 2017-04-08 DIAGNOSIS — I129 Hypertensive chronic kidney disease with stage 1 through stage 4 chronic kidney disease, or unspecified chronic kidney disease: Secondary | ICD-10-CM | POA: Diagnosis not present

## 2017-04-08 NOTE — Telephone Encounter (Signed)
Message sent to Dr. Leonie Man. PT has carpel tunnel. Clearance form was fax 01/23/2016.Per last note pt refuse surgery

## 2017-04-08 NOTE — Telephone Encounter (Signed)
Pts husband called wanting to speak with RN or Dr.Sethi to discuss what has been going on and to see if there is anyway possible the pt can be seen sooner. Please call to discuss

## 2017-04-09 ENCOUNTER — Encounter: Payer: Self-pay | Admitting: Neurology

## 2017-04-09 ENCOUNTER — Telehealth: Payer: Self-pay | Admitting: Neurology

## 2017-04-09 ENCOUNTER — Ambulatory Visit (INDEPENDENT_AMBULATORY_CARE_PROVIDER_SITE_OTHER): Payer: Medicare Other | Admitting: Neurology

## 2017-04-09 VITALS — BP 141/62 | HR 60 | Wt 113.4 lb

## 2017-04-09 DIAGNOSIS — G44209 Tension-type headache, unspecified, not intractable: Secondary | ICD-10-CM
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
## 2017-04-10 ENCOUNTER — Encounter (INDEPENDENT_AMBULATORY_CARE_PROVIDER_SITE_OTHER): Payer: Medicare Other | Admitting: Ophthalmology

## 2017-04-14 ENCOUNTER — Encounter (HOSPITAL_COMMUNITY): Payer: Medicare Other

## 2017-04-17 DIAGNOSIS — R1013 Epigastric pain: Secondary | ICD-10-CM | POA: Diagnosis not present

## 2017-04-17 DIAGNOSIS — Z86010 Personal history of colonic polyps: Secondary | ICD-10-CM | POA: Diagnosis not present

## 2017-04-17 DIAGNOSIS — K219 Gastro-esophageal reflux disease without esophagitis: Secondary | ICD-10-CM | POA: Diagnosis not present

## 2017-04-17 DIAGNOSIS — R634 Abnormal weight loss: Secondary | ICD-10-CM | POA: Diagnosis not present

## 2017-04-21 ENCOUNTER — Encounter (HOSPITAL_COMMUNITY): Payer: Medicare Other

## 2017-04-21 ENCOUNTER — Encounter: Payer: Self-pay | Admitting: Hematology and Oncology

## 2017-04-21 ENCOUNTER — Inpatient Hospital Stay: Payer: Medicare Other | Attending: Hematology and Oncology | Admitting: Hematology and Oncology

## 2017-04-21 ENCOUNTER — Inpatient Hospital Stay: Payer: Medicare Other

## 2017-04-21 DIAGNOSIS — E538 Deficiency of other specified B group vitamins: Secondary | ICD-10-CM | POA: Diagnosis not present

## 2017-04-21 DIAGNOSIS — D631 Anemia in chronic kidney disease: Secondary | ICD-10-CM | POA: Insufficient documentation

## 2017-04-21 DIAGNOSIS — N183 Chronic kidney disease, stage 3 unspecified: Secondary | ICD-10-CM

## 2017-04-21 DIAGNOSIS — N189 Chronic kidney disease, unspecified: Secondary | ICD-10-CM | POA: Diagnosis not present

## 2017-04-21 DIAGNOSIS — Z9189 Other specified personal risk factors, not elsewhere classified: Secondary | ICD-10-CM | POA: Diagnosis not present

## 2017-04-21 LAB — CBC WITH DIFFERENTIAL/PLATELET
Basophils Absolute: 0 10*3/uL (ref 0.0–0.1)
Basophils Relative: 0 %
Eosinophils Absolute: 0.3 10*3/uL (ref 0.0–0.5)
Eosinophils Relative: 5 %
HCT: 36.2 % (ref 34.8–46.6)
Hemoglobin: 11 g/dL — ABNORMAL LOW (ref 11.6–15.9)
Lymphocytes Relative: 27 %
Lymphs Abs: 1.5 10*3/uL (ref 0.9–3.3)
MCH: 25.4 pg (ref 25.1–34.0)
MCHC: 30.4 g/dL — ABNORMAL LOW (ref 31.5–36.0)
MCV: 83.6 fL (ref 79.5–101.0)
Monocytes Absolute: 0.4 10*3/uL (ref 0.1–0.9)
Monocytes Relative: 7 %
Neutro Abs: 3.4 10*3/uL (ref 1.5–6.5)
Neutrophils Relative %: 61 %
Platelets: 176 10*3/uL (ref 145–400)
RBC: 4.33 MIL/uL (ref 3.70–5.45)
RDW: 15.8 % (ref 11.2–16.1)
WBC: 5.6 10*3/uL (ref 3.9–10.3)

## 2017-04-21 NOTE — Assessment & Plan Note (Signed)
She has received B12 injections Her last B12 level was satisfactory I will space out her B12 injection and plan to recheck it again in 6 months

## 2017-04-21 NOTE — Assessment & Plan Note (Signed)
She is concerned about her nipple appearance Previous biopsy has excluded malignancy Examination showed crusted dry skin I suspect it is induced by significant dryness I recommend Vaseline only I will examine her breasts again in a few months

## 2017-04-21 NOTE — Progress Notes (Signed)
[facility] OFFICE PROGRESS NOTE  [name], MD SUMMARY OF HEMATOLOGIC HISTORY:  This is a patient that has been followed here since 2011 for chronic anemia In 2011, she had a bone marrow aspirate and biopsy that came back normal. The patient has been receiving Aranesp injection for chronic anemia up until 2014. It was subsequently discontinued due stability of the anemia. Starting 05/27/2014, darbepoetin was resumed at 200 g every other month to keep hemoglobin greater than 10 g. Treatment was discontinued in October 2016 and resumed in September 2017 The patient is subsequently found to have severe vitamin B12 deficiency and is started on vitamin B12 injection on December 25, 2016 INTERVAL HISTORY: Abigail Hoover 72 y.o. female returns for further follow-up. She has concern about her left nipple which is dry and cracked She has occasional nipple discharge The area has been biopsied before Otherwise, her energy level is fair The patient denies any recent signs or symptoms of bleeding such as spontaneous epistaxis, hematuria or hematochezia.   I have reviewed the past medical history, past surgical history, social history and family history with the patient and they are unchanged from previous note.  ALLERGIES:  is allergic to atorvastatin and ezetimibe.  MEDICATIONS:  Current Outpatient Medications  Medication Sig Dispense Refill  . acarbose (PRECOSE) 50 MG tablet Take 1 tablet just before each meal, 3 times a day 90 tablet 0  . amLODipine (NORVASC) 10 MG tablet Take 10 mg by mouth daily.    Marland Kitchen aspirin 81 MG EC tablet Take 1 tablet (81 mg total) by mouth 2 (two) times daily with a meal. For 6 weeks    . DEXILANT 60 MG capsule     . furosemide (LASIX) 40 MG tablet Take 40 mg by mouth every morning.  7  . gabapentin (NEURONTIN) 300 MG capsule Take 300 mg by mouth 4 (four) times daily.    Marland Kitchen glucose blood (ONETOUCH VERIO) test strip Use as instructed 100 each 12  .  hydrocortisone 2.5 % ointment APPLY TO AFFECED ARES(S) TOPICALLY TWICE DAILY AS NEEDED  3  . ibandronate (BONIVA) 150 MG tablet     . isosorbide mononitrate (IMDUR) 60 MG 24 hr tablet Take 60 mg by mouth daily.  3  . JANUMET 50-500 MG tablet TAKE ONE TABLET BY MOUTH TWICE DAILY WITH A MEAL 180 tablet 2  . Lancets (ONETOUCH ULTRASOFT) lancets Use as instructed 100 each 12  . Lancets Misc. (ONE TOUCH SURESOFT) MISC Use to check blood sugars 1 each 3  . magnesium oxide (MAG-OX) 400 MG tablet Take 400 mg by mouth 2 (two) times daily.    . metoprolol succinate (TOPROL-XL) 50 MG 24 hr tablet Take 1 tablet (50 mg total) by mouth daily. Take with or immediately following a meal. 30 tablet 3  . MYRBETRIQ 50 MG TB24 tablet Take 50 mg by mouth daily.  3  . nitroGLYCERIN (NITROSTAT) 0.4 MG SL tablet Place 1 tablet (0.4 mg total) under the tongue every 5 (five) minutes x 3 doses as needed for chest pain. 25 tablet 12  . pentosan polysulfate (ELMIRON) 100 MG capsule Take 100 mg by mouth 3 (three) times daily before meals.     . pravastatin (PRAVACHOL) 20 MG tablet TAKE ONE TABLET BY MOUTH ONCE DAILY 90 tablet 1  . SYNTHROID 75 MCG tablet TAKE ONE TABLET BY MOUTH ONCE DAILY 90 tablet 1  . triamcinolone ointment (KENALOG) 0.1 % APPLY THIN LAYER ON THE SKIN TWICE A DAY  FOR 3 WEEKS  0   No current facility-administered medications for this visit.      REVIEW OF SYSTEMS:   Constitutional: Denies fevers, chills or night sweats Eyes: Denies blurriness of vision Ears, nose, mouth, throat, and face: Denies mucositis or sore throat Respiratory: Denies cough, dyspnea or wheezes Cardiovascular: Denies palpitation, chest discomfort or lower extremity swelling Gastrointestinal:  Denies nausea, heartburn or change in bowel habits Skin: Denies abnormal skin rashes Lymphatics: Denies new lymphadenopathy or easy bruising Neurological:Denies numbness, tingling or new weaknesses Behavioral/Psych: Mood is stable, no new  changes  All other systems were reviewed with the patient and are negative.  PHYSICAL EXAMINATION: ECOG PERFORMANCE STATUS: 0 - Asymptomatic  Vitals:   04/21/17 1252  BP: (!) 122/52  Pulse: (!) 57  Resp: 18  Temp: (!) 97.5 F (36.4 C)  SpO2: 100%   Filed Weights   04/21/17 1252  Weight: 112 lb 12.8 oz (51.2 kg)    GENERAL:alert, no distress and comfortable SKIN: Noted extremely dry skin throughout EYES: normal, Conjunctiva are pink and non-injected, sclera clear OROPHARYNX:no exudate, no erythema and lips, buccal mucosa, and tongue normal  NECK: supple, thyroid normal size, non-tender, without nodularity LYMPH:  no palpable lymphadenopathy in the cervical, axillary or inguinal LUNGS: clear to auscultation and percussion with normal breathing effort HEART: regular rate & rhythm and no murmurs and no lower extremity edema ABDOMEN:abdomen soft, non-tender and normal bowel sounds Musculoskeletal:no cyanosis of digits and no clubbing  NEURO: alert & oriented x 3 with fluent speech, no focal motor/sensory deficits Bilateral breast examination was performed.  Normal breast exam on the right.  On the left, mild rash around the left areola with dry skin and cracked nipple, tender to touch  LABORATORY DATA:  I have reviewed the data as listed     Component Value Date/Time   NA 134 (L) 12/20/2016 1342   NA 138 12/12/2016 1150   K 4.6 12/20/2016 1342   K 4.6 12/12/2016 1150   CL 98 12/20/2016 1342   CL 103 07/27/2012 1259   CO2 30 12/20/2016 1342   CO2 31 (H) 12/12/2016 1150   GLUCOSE 103 (H) 12/20/2016 1342   GLUCOSE 149 (H) 12/12/2016 1150   GLUCOSE 118 (H) 07/27/2012 1259   BUN 29 (H) 12/20/2016 1342   BUN 23.4 12/12/2016 1150   CREATININE 1.58 (H) 12/20/2016 1342   CREATININE 1.7 (H) 12/12/2016 1150   CALCIUM 10.0 12/20/2016 1342   CALCIUM 11.4 (H) 12/12/2016 1150   PROT 6.5 12/12/2016 1150   ALBUMIN 3.9 12/12/2016 1150   AST 19 12/12/2016 1150   ALT 10 12/12/2016  1150   ALKPHOS 65 12/12/2016 1150   BILITOT 0.36 12/12/2016 1150   GFRNONAA 52 (L) 03/04/2016 0528   GFRAA >60 03/04/2016 0528    No results found for: SPEP, UPEP  Lab Results  Component Value Date   WBC 5.6 04/21/2017   NEUTROABS 3.4 04/21/2017   HGB 11.0 (L) 04/21/2017   HCT 36.2 04/21/2017   MCV 83.6 04/21/2017   PLT 176 04/21/2017      Chemistry      Component Value Date/Time   NA 134 (L) 12/20/2016 1342   NA 138 12/12/2016 1150   K 4.6 12/20/2016 1342   K 4.6 12/12/2016 1150   CL 98 12/20/2016 1342   CL 103 07/27/2012 1259   CO2 30 12/20/2016 1342   CO2 31 (H) 12/12/2016 1150   BUN 29 (H) 12/20/2016 1342   BUN 23.4 12/12/2016  1150   CREATININE 1.58 (H) 12/20/2016 1342   CREATININE 1.7 (H) 12/12/2016 1150      Component Value Date/Time   CALCIUM 10.0 12/20/2016 1342   CALCIUM 11.4 (H) 12/12/2016 1150   ALKPHOS 65 12/12/2016 1150   AST 19 12/12/2016 1150   ALT 10 12/12/2016 1150   BILITOT 0.36 12/12/2016 1150       ASSESSMENT & PLAN:  Anemia in chronic kidney disease We discussed some of the risks, benefits, and alternatives of erythropoietin stimulating agents such as Procrit or Aranesp.  She tolerated injections well  Her hemoglobin is 11 today She will skip her injection today I will space out her darbepoetin injection to every 5 weeks   Vitamin B 12 deficiency She has received B12 injections Her last B12 level was satisfactory I will space out her B12 injection and plan to recheck it again in 6 months  At high risk for breast cancer She is concerned about her nipple appearance Previous biopsy has excluded malignancy Examination showed crusted dry skin I suspect it is induced by significant dryness I recommend Vaseline only I will examine her breasts again in a few months   No orders of the defined types were placed in this encounter.   All questions were answered. The patient knows to call the clinic with any problems, questions or  concerns. No barriers to learning was detected.  I spent 15 minutes counseling the patient face to face. The total time spent in the appointment was 20 minutes and more than 50% was on counseling.     [name], MD 1/28/20193:21 PM

## 2017-04-21 NOTE — Patient Instructions (Signed)
Cyanocobalamin, Vitamin B12 injection What is this medicine? CYANOCOBALAMIN (sye an oh koe BAL a min) is a man made form of vitamin B12. Vitamin B12 is used in the growth of healthy blood cells, nerve cells, and proteins in the body. It also helps with the metabolism of fats and carbohydrates. This medicine is used to treat people who can not absorb vitamin B12. This medicine may be used for other purposes; ask your health care provider or pharmacist if you have questions. COMMON BRAND NAME(S): B-12 Compliance Kit, B-12 Injection Kit, Cyomin, LA-12, Nutri-Twelve, Physicians EZ Use B-12, Primabalt What should I tell my health care provider before I take this medicine? They need to know if you have any of these conditions: -kidney disease -Leber's disease -megaloblastic anemia -an unusual or allergic reaction to cyanocobalamin, cobalt, other medicines, foods, dyes, or preservatives -pregnant or trying to get pregnant -breast-feeding How should I use this medicine? This medicine is injected into a muscle or deeply under the skin. It is usually given by a health care professional in a clinic or doctor's office. However, your doctor may teach you how to inject yourself. Follow all instructions. Talk to your pediatrician regarding the use of this medicine in children. Special care may be needed. Overdosage: If you think you have taken too much of this medicine contact a poison control center or emergency room at once. NOTE: This medicine is only for you. Do not share this medicine with others. What if I miss a dose? If you are given your dose at a clinic or doctor's office, call to reschedule your appointment. If you give your own injections and you miss a dose, take it as soon as you can. If it is almost time for your next dose, take only that dose. Do not take double or extra doses. What may interact with this medicine? -colchicine -heavy alcohol intake This list may not describe all possible  interactions. Give your health care provider a list of all the medicines, herbs, non-prescription drugs, or dietary supplements you use. Also tell them if you smoke, drink alcohol, or use illegal drugs. Some items may interact with your medicine. What should I watch for while using this medicine? Visit your doctor or health care professional regularly. You may need blood work done while you are taking this medicine. You may need to follow a special diet. Talk to your doctor. Limit your alcohol intake and avoid smoking to get the best benefit. What side effects may I notice from receiving this medicine? Side effects that you should report to your doctor or health care professional as soon as possible: -allergic reactions like skin rash, itching or hives, swelling of the face, lips, or tongue -blue tint to skin -chest tightness, pain -difficulty breathing, wheezing -dizziness -red, swollen painful area on the leg Side effects that usually do not require medical attention (report to your doctor or health care professional if they continue or are bothersome): -diarrhea -headache This list may not describe all possible side effects. Call your doctor for medical advice about side effects. You may report side effects to FDA at 1-800-FDA-1088. Where should I keep my medicine? Keep out of the reach of children. Store at room temperature between 15 and 30 degrees C (59 and 85 degrees F). Protect from light. Throw away any unused medicine after the expiration date. NOTE: This sheet is a summary. It may not cover all possible information. If you have questions about this medicine, talk to your doctor, pharmacist, or   health care provider.  2018 Elsevier/Gold Standard (2007-06-22 22:10:20)  

## 2017-04-21 NOTE — Assessment & Plan Note (Signed)
We discussed some of the risks, benefits, and alternatives of erythropoietin stimulating agents such as Procrit or Aranesp.  She tolerated injections well  Her hemoglobin is 11 today She will skip her injection today I will space out her darbepoetin injection to every 5 weeks

## 2017-04-23 ENCOUNTER — Telehealth: Payer: Self-pay | Admitting: Hematology and Oncology

## 2017-04-23 ENCOUNTER — Encounter: Payer: Self-pay | Admitting: Endocrinology

## 2017-04-23 DIAGNOSIS — K219 Gastro-esophageal reflux disease without esophagitis: Secondary | ICD-10-CM | POA: Diagnosis not present

## 2017-04-23 DIAGNOSIS — R1013 Epigastric pain: Secondary | ICD-10-CM | POA: Diagnosis not present

## 2017-04-23 DIAGNOSIS — K449 Diaphragmatic hernia without obstruction or gangrene: Secondary | ICD-10-CM | POA: Diagnosis not present

## 2017-04-23 NOTE — Telephone Encounter (Signed)
Mailed patient calendar of upcoming February through June appointments.

## 2017-04-24 ENCOUNTER — Other Ambulatory Visit: Payer: Self-pay | Admitting: Endocrinology

## 2017-04-25 ENCOUNTER — Encounter (INDEPENDENT_AMBULATORY_CARE_PROVIDER_SITE_OTHER): Payer: Medicare Other | Admitting: Ophthalmology

## 2017-04-25 DIAGNOSIS — E113393 Type 2 diabetes mellitus with moderate nonproliferative diabetic retinopathy without macular edema, bilateral: Secondary | ICD-10-CM | POA: Diagnosis not present

## 2017-04-25 DIAGNOSIS — I10 Essential (primary) hypertension: Secondary | ICD-10-CM

## 2017-04-25 DIAGNOSIS — E11319 Type 2 diabetes mellitus with unspecified diabetic retinopathy without macular edema: Secondary | ICD-10-CM

## 2017-04-25 DIAGNOSIS — H35033 Hypertensive retinopathy, bilateral: Secondary | ICD-10-CM | POA: Diagnosis not present

## 2017-05-01 DIAGNOSIS — L821 Other seborrheic keratosis: Secondary | ICD-10-CM | POA: Diagnosis not present

## 2017-05-12 ENCOUNTER — Inpatient Hospital Stay: Payer: Medicare Other

## 2017-05-12 ENCOUNTER — Inpatient Hospital Stay: Payer: Medicare Other | Attending: Hematology and Oncology

## 2017-05-12 VITALS — BP 139/61 | HR 61 | Temp 97.6°F | Resp 18

## 2017-05-12 DIAGNOSIS — N183 Chronic kidney disease, stage 3 unspecified: Secondary | ICD-10-CM

## 2017-05-12 DIAGNOSIS — E538 Deficiency of other specified B group vitamins: Secondary | ICD-10-CM | POA: Insufficient documentation

## 2017-05-12 DIAGNOSIS — D631 Anemia in chronic kidney disease: Secondary | ICD-10-CM | POA: Insufficient documentation

## 2017-05-12 DIAGNOSIS — N189 Chronic kidney disease, unspecified: Secondary | ICD-10-CM | POA: Diagnosis not present

## 2017-05-12 LAB — CBC WITH DIFFERENTIAL/PLATELET
Basophils Absolute: 0 10*3/uL (ref 0.0–0.1)
Basophils Relative: 0 %
Eosinophils Absolute: 0.3 10*3/uL (ref 0.0–0.5)
Eosinophils Relative: 6 %
HCT: 34.7 % — ABNORMAL LOW (ref 34.8–46.6)
Hemoglobin: 10.6 g/dL — ABNORMAL LOW (ref 11.6–15.9)
Lymphocytes Relative: 34 %
Lymphs Abs: 1.8 10*3/uL (ref 0.9–3.3)
MCH: 25.7 pg (ref 25.1–34.0)
MCHC: 30.5 g/dL — ABNORMAL LOW (ref 31.5–36.0)
MCV: 84 fL (ref 79.5–101.0)
Monocytes Absolute: 0.4 10*3/uL (ref 0.1–0.9)
Monocytes Relative: 7 %
Neutro Abs: 2.9 10*3/uL (ref 1.5–6.5)
Neutrophils Relative %: 53 %
Platelets: 191 10*3/uL (ref 145–400)
RBC: 4.13 MIL/uL (ref 3.70–5.45)
RDW: 15.9 % — ABNORMAL HIGH (ref 11.2–14.5)
WBC: 5.4 10*3/uL (ref 3.9–10.3)

## 2017-05-12 MED ORDER — CYANOCOBALAMIN 1000 MCG/ML IJ SOLN
1000.0000 ug | Freq: Once | INTRAMUSCULAR | Status: AC
  Administered 2017-05-12: 1000 ug via INTRAMUSCULAR

## 2017-05-12 MED ORDER — DARBEPOETIN ALFA 200 MCG/0.4ML IJ SOSY
200.0000 ug | PREFILLED_SYRINGE | Freq: Once | INTRAMUSCULAR | Status: AC
  Administered 2017-05-12: 200 ug via SUBCUTANEOUS

## 2017-05-12 NOTE — Patient Instructions (Signed)

## 2017-05-20 DIAGNOSIS — N2581 Secondary hyperparathyroidism of renal origin: Secondary | ICD-10-CM | POA: Diagnosis not present

## 2017-05-20 DIAGNOSIS — N183 Chronic kidney disease, stage 3 (moderate): Secondary | ICD-10-CM | POA: Diagnosis not present

## 2017-05-22 ENCOUNTER — Telehealth: Payer: Self-pay | Admitting: Endocrinology

## 2017-05-22 NOTE — Telephone Encounter (Signed)
Spoke with the patient and she stated she had labs 3 days ago at Dr. Serita Grit office she is waiting for results but she is not sure what labs were drawn- I made her a f/u apt for 05/27/17

## 2017-05-22 NOTE — Telephone Encounter (Signed)
Cannot change her medication as yet without having labs including kidney tests and A1c done.   She also needs to schedule a follow-up office visit as she is overdue, will discuss when she comes in Please schedule both

## 2017-05-22 NOTE — Telephone Encounter (Signed)
Pt wants to know if she can increase her dosage of Janument? Her sugars have been running high. Please call pt at ph# [phone] to advise. If she does not answer the above ph# please call ph# [phone]

## 2017-05-22 NOTE — Telephone Encounter (Signed)
We need labs from Dr. Serita Grit office but most likely will need to do A1c before her visit since they did not check A1c

## 2017-05-22 NOTE — Telephone Encounter (Signed)
Do we needs lab results faxed from Dr. Serita Grit office please advise

## 2017-05-26 DIAGNOSIS — N183 Chronic kidney disease, stage 3 (moderate): Secondary | ICD-10-CM | POA: Diagnosis not present

## 2017-05-26 DIAGNOSIS — N2581 Secondary hyperparathyroidism of renal origin: Secondary | ICD-10-CM | POA: Diagnosis not present

## 2017-05-26 DIAGNOSIS — I129 Hypertensive chronic kidney disease with stage 1 through stage 4 chronic kidney disease, or unspecified chronic kidney disease: Secondary | ICD-10-CM | POA: Diagnosis not present

## 2017-05-26 DIAGNOSIS — D631 Anemia in chronic kidney disease: Secondary | ICD-10-CM | POA: Diagnosis not present

## 2017-05-26 NOTE — Telephone Encounter (Signed)
Spoke to spouse. Pt was at an appt.  Confirmed pt appt for 03/05 w/ Dr. Dwyane Dee. Called Dr. Serita Grit office. Requested labs to be fax. Receptionist said labs have not been reviewed by Dr. Posey Pronto as of yet, but would be by the end of the day. Will fax lab results after review.

## 2017-05-27 ENCOUNTER — Encounter: Payer: Self-pay | Admitting: Endocrinology

## 2017-05-27 ENCOUNTER — Other Ambulatory Visit: Payer: Self-pay | Admitting: Endocrinology

## 2017-05-27 ENCOUNTER — Encounter: Payer: Medicare Other | Admitting: Endocrinology

## 2017-05-27 VITALS — BP 120/58 | HR 56 | Ht 60.0 in | Wt 115.0 lb

## 2017-05-27 LAB — POCT GLYCOSYLATED HEMOGLOBIN (HGB A1C): Hemoglobin A1C: 6.1

## 2017-05-28 ENCOUNTER — Other Ambulatory Visit: Payer: Self-pay | Admitting: Endocrinology

## 2017-05-28 NOTE — Progress Notes (Signed)
This encounter was created in error - please disregard.

## 2017-06-04 ENCOUNTER — Encounter: Payer: Self-pay | Admitting: Endocrinology

## 2017-06-04 ENCOUNTER — Ambulatory Visit (INDEPENDENT_AMBULATORY_CARE_PROVIDER_SITE_OTHER): Payer: Medicare Other | Admitting: Endocrinology

## 2017-06-04 ENCOUNTER — Ambulatory Visit: Payer: Medicare Other | Admitting: Neurology

## 2017-06-04 VITALS — BP 126/60 | HR 59 | Wt 115.0 lb

## 2017-06-04 DIAGNOSIS — E1149 Type 2 diabetes mellitus with other diabetic neurological complication: Secondary | ICD-10-CM

## 2017-06-04 NOTE — Patient Instructions (Signed)
Reduce Carbs  Lentil and beans, add yogurt and Cheeses at meals  Try Acarbose 2 tabs at dinner

## 2017-06-04 NOTE — Progress Notes (Signed)
Patient ID: Abigail Hoover, female   DOB: 1945-08-08, 72 y.o.   MRN: [id number]    Reason for Appointment:  follow-up of various issues and high calcium  History of Present Illness   Diagnosis: Type 2 DIABETES MELITUS, long-standing  She has had mild diabetes for several years but has required multiple drugs for control Usually has had postprandial hyperglycemia, this has been better controlled with adding Glyset to her main meals Has never been on insulin or a GLP-1 drug Actos had been stopped previously because of tendency to edema  Recent history:   Oral hypoglycemic drugs:  Precose 50 mg before breakfast and dinner, Janumet 50/ 500  Once a day, Glucophage 2x a day   Her A1c is now 6.1 compared to 6.6 previously but she has not been seen since 10/18   Current management, problems identified and blood sugar patterns:  She did  bring her monitor for download    She again called to report tendency to high readings after her evening meal compared to her morning reading  However she is still not getting much protein at dinnertime with her vegetarian meal  She will sometimes have lentils, beans but very little dairy products or other high protein foods at dinnertime  She is taking her acarbose 3 times a day even though she is eating only a snack in the afternoon  Her Januvia and metformin doses are limited by her renal function   Side effects from medications: None Proper timing of medications in relation to meals: Yes.          Monitors blood glucose: 1 times a day or less .    Glucometer: One Touch        Blood Glucose readings from download as above  Mean values apply above for all meters except median for One Touch  PRE-MEAL Fasting Lunch  PC dinner Bedtime Overall  Glucose range:    137-203    Mean/median:  91   170       Meals: 2- 3 meals per day.  eating small breakfast and lunch but very little protein, is vegetarian   Physical activity:  very little  walking            Dietician visit: Most recent: None       Complications: are mild symptoms of neuropathy, no nephropathy or retinopathy   Wt Readings from Last 3 Encounters:  06/04/17 115 lb (52.2 kg)  05/27/17 115 lb (52.2 kg)  04/21/17 112 lb 12.8 oz (51.2 kg)   Lab Results  Component Value Date   HGBA1C 6.1 05/27/2017   HGBA1C 6.6 (H) 12/20/2016   HGBA1C 5.0 10/10/2016   Lab Results  Component Value Date   MICROALBUR 1.0 04/18/2016   LDLCALC 75 12/20/2016   CREATININE 1.58 (H) 12/20/2016      Lab Results  Component Value Date   FRUCTOSAMINE 266 07/26/2016   FRUCTOSAMINE 230 12/07/2014     OTHER problems Addressed today are in review of systems:    Allergies as of 06/04/2017      Reactions   Atorvastatin Rash   Ezetimibe Itching      Medication List        Accurate as of 06/04/17  4:57 PM. Always use your most recent med list.          acarbose 50 MG tablet Commonly known as:  PRECOSE Take 1 tablet just before each meal, 3 times a day   amLODipine 2.5 MG  tablet Commonly known as:  NORVASC Take 2.5 mg by mouth daily.   aspirin 81 MG EC tablet Take 1 tablet (81 mg total) by mouth 2 (two) times daily with a meal. For 6 weeks   DEXILANT 60 MG capsule Generic drug:  dexlansoprazole   furosemide 40 MG tablet Commonly known as:  LASIX Take 40 mg by mouth every morning.   gabapentin 300 MG capsule Commonly known as:  NEURONTIN Take 300 mg by mouth 4 (four) times daily.   glucose blood test strip Commonly known as:  ONETOUCH VERIO Use as instructed   hydrocortisone 2.5 % ointment APPLY TO AFFECED ARES(S) TOPICALLY TWICE DAILY AS NEEDED   ibandronate 150 MG tablet Commonly known as:  BONIVA   isosorbide mononitrate 60 MG 24 hr tablet Commonly known as:  IMDUR Take 60 mg by mouth daily.   JANUMET 50-500 MG tablet Generic drug:  sitaGLIPtin-metformin TAKE ONE TABLET BY MOUTH TWICE DAILY WITH A MEAL   magnesium oxide 400 MG  tablet Commonly known as:  MAG-OX Take 400 mg by mouth 2 (two) times daily.   metoprolol succinate 50 MG 24 hr tablet Commonly known as:  TOPROL-XL Take 1 tablet (50 mg total) by mouth daily. Take with or immediately following a meal.   MYRBETRIQ 50 MG Tb24 tablet Generic drug:  mirabegron ER Take 50 mg by mouth daily.   nitroGLYCERIN 0.4 MG SL tablet Commonly known as:  NITROSTAT Place 1 tablet (0.4 mg total) under the tongue every 5 (five) minutes x 3 doses as needed for chest pain.   ONE TOUCH SURESOFT Misc Use to check blood sugars   onetouch ultrasoft lancets Use as instructed   pentosan polysulfate 100 MG capsule Commonly known as:  ELMIRON Take 100 mg by mouth 3 (three) times daily before meals.   pravastatin 20 MG tablet Commonly known as:  PRAVACHOL TAKE ONE TABLET BY MOUTH ONCE DAILY   SYNTHROID 75 MCG tablet Generic drug:  levothyroxine TAKE ONE TABLET BY MOUTH ONCE DAILY   triamcinolone ointment 0.1 % Commonly known as:  KENALOG APPLY THIN LAYER ON THE SKIN TWICE A DAY FOR 3 WEEKS       Allergies:  Allergies  Allergen Reactions  . Atorvastatin Rash  . Ezetimibe Itching    Past Medical History:  Diagnosis Date  . 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 Anxiety   . Arthritis   . Bladder irritation   . Diabetes mellitus   . GERD (gastroesophageal reflux disease)   . Hypercholesterolemia   . Hypertension   . Hypothyroidism   . Iron deficiency anemia, unspecified   . Psoriasis   . Transfusion history 03/05/2016   for postoperative (ORIF) anemia superimposed on chronic anemia    Past Surgical History:  Procedure Laterality Date  . ABDOMINAL HYSTERECTOMY     TAH  . BREAST BIOPSY     Left breast biopsy benign lesion  . BREAST BIOPSY  03/11/2012   Procedure: BREAST BIOPSY;  Surgeon: Odis Hollingshead, MD;  Location: [place];  Service: General;  Laterality: Left;  remove left breast mass  . CARDIAC CATHETERIZATION N/A 01/02/2015    Procedure: Left Heart Cath and Coronary Angiography;  Surgeon: Charolette Forward, MD;  Location: [facility];  Service: Cardiovascular;  Laterality: N/A;  . CATARACT EXTRACTION    . CERVICAL DISC SURGERY    . COLONOSCOPY  2013   neg. next 2023.   Marland Kitchen EYE SURGERY    . LEFT HIP  SURGERY    . TOTAL HIP ARTHROPLASTY Left 03/02/2016   Procedure: TOTAL HIP ARTHROPLASTY ANTERIOR APPROACH;  Surgeon: Rod Can, MD;  Location: [facility];  Service: Orthopedics;  Laterality: Left;    Family History  Problem Relation Age of Onset  . Heart disease Mother        heart attack  . Stroke Father        brain hemorrhage  . Diabetes Brother   . Cancer Brother   . Breast cancer Sister   . Cancer Sister        breast cancer    Social History:  reports that  has never smoked. she has never used smokeless tobacco. She reports that she does not drink alcohol or use drugs.  Review of Systems:  Following is a copy of the previous note:  HYPERTENSION: Mild, this is well-controlled with metoprolol Only, previously losartan was stopped  Followed by cardiologist  RENAL dysfunction has been variable and followed by nephrologist She has been told to take Lasix regularly now for edema Also she was told to restrict fluid intake  Lab Results  Component Value Date   CREATININE 1.58 (H) 12/20/2016   BUN 29 (H) 12/20/2016   NA 134 (L) 12/20/2016   K 4.6 12/20/2016   CL 98 12/20/2016   CO2 30 12/20/2016     HYPERLIPIDEMIA: The lipid abnormality consists of elevated LDL which is mild and well controlled with pravastatin 20 mg LDL 75 She thinks she had a rash from Lipitor    Lab Results  Component Value Date   CHOL 156 12/20/2016   HDL 60.70 12/20/2016   LDLCALC 75 12/20/2016   TRIG 102.0 12/20/2016   CHOLHDL 3 12/20/2016    She has a long history of primary HYPOTHYROIDISM   She takes Levothyroxine on an empty stomach in the morning Regularly  TSH has been Consistent although low normal now Now  taking 75 g   Lab Results  Component Value Date   TSH 0.68 12/20/2016   TSH 1.63 07/26/2016   TSH 2.97 04/18/2016   FREET4 1.35 12/20/2016   FREET4 1.07 04/18/2016   FREET4 1.37 07/13/2015    NEUROPATHY:  No symptoms of numbness In the feet, is on Long-term gabapentin for history of paresthesia, Mostly burning    Diabetic foot exam in : Normal monofilament sensation but absent pulses   OSTEOPENIA: Her T score Previously done by gynecologist was -2.1, currently on Boniva started in 01/2014  Recent bone density was -2.6 in 2/18, this was ordered by her gastroenterologist    Examination:   BP 126/60 (BP Location: Left Arm, Patient Position: Sitting, Cuff Size: Normal)   Pulse (!) 59   Wt 115 lb (52.2 kg)   SpO2 97%   BMI 22.46 kg/m   Body mass index is 22.46 kg/m.        ASSESSMENT/ PLAN:     Diabetes type 2, Nonobese   See history of present illness for detailed discussion of current diabetes management, blood sugar patterns and problems identified  Although her blood sugars seem to be high after meals she is checking them mostly after evening meal and these are variably high depending on her food intake He probably tends to have higher sugars than she has less protein and more carbohydrate and eating This is despite taking her Precose consistently for the meal  Reassured her that her A1c is excellent and she does not need aggressive treatment She wants to increase her Janumet but  this is limited by her renal function and metformin will not help her postprandial readings  For now we will have her take 100 mg of acarbose at dinnertime instead of 50 More protein at dinnertime and discussed how to do this    HYPOTHYROIDISM: TSH to be rechecked on the next visit  Patient Instructions  Reduce Carbs  Lentil and beans, add yogurt and Cheeses at meals  Try Acarbose 2 tabs at dinner   Influenza vaccine given  Elayne Snare 06/04/2017, 4:57 PM

## 2017-06-06 ENCOUNTER — Telehealth: Payer: Self-pay | Admitting: Endocrinology

## 2017-06-06 NOTE — Telephone Encounter (Signed)
Need refill for medicationacarbose (PRECOSE) 50 MG tablet [[id number]]  JANUMET 50-500 MG tablet [[id number]]    Send to   [place], [place] #:  [id number]

## 2017-06-06 NOTE — Telephone Encounter (Signed)
[facility] is call for clarification on dosage for medication acarbose.please advise  P# [phone]

## 2017-06-07 ENCOUNTER — Other Ambulatory Visit: Payer: Self-pay | Admitting: Endocrinology

## 2017-06-09 ENCOUNTER — Telehealth: Payer: Self-pay | Admitting: Endocrinology

## 2017-06-09 NOTE — Telephone Encounter (Signed)
She will now need to take 1 tablet at breakfast, half tablet at lunch and 2 tablets at dinner

## 2017-06-09 NOTE — Telephone Encounter (Signed)
Janumet refilled on 06/08/2017. Waiting on MD to confirm Acarbose dosage.

## 2017-06-09 NOTE — Telephone Encounter (Signed)
Waiting on provider to clarify dosage.

## 2017-06-09 NOTE — Telephone Encounter (Signed)
[facility] ph# [phone] called re: patient told them that she is to take 1 tablet of Acarbose 50 mg 4 x per day but script is written for only 3 x per day. Please call them to clarify

## 2017-06-09 NOTE — Telephone Encounter (Signed)
See message and please clarify.

## 2017-06-10 MED ORDER — ACARBOSE 50 MG PO TABS: ORAL_TABLET | ORAL | 0 refills | Status: DC

## 2017-06-10 NOTE — Telephone Encounter (Signed)
Pharmacy is needing further clarification.    Please advise on below. The sig does not match the quantity. Please send new Rx with updated sig/quantity to pharmacy. #90 tabs is not enough for 1 tablet at breakfast, half tablet at lunch and 2 tablets at dinner.

## 2017-06-10 NOTE — Telephone Encounter (Signed)
New Rx sent. See meds.

## 2017-06-10 NOTE — Telephone Encounter (Signed)
Send 105 tablets

## 2017-06-10 NOTE — Telephone Encounter (Signed)
Done. See meds and 06/09/17 phone note.

## 2017-06-10 NOTE — Telephone Encounter (Signed)
Left detailed mess informing pharmacy of below.

## 2017-06-10 NOTE — Addendum Note (Signed)
Addended by: Cresenciano Lick on: 06/10/2017 01:20 PM   Modules accepted: Orders

## 2017-06-11 ENCOUNTER — Ambulatory Visit: Payer: Medicare Other | Admitting: Endocrinology

## 2017-06-13 DIAGNOSIS — R399 Unspecified symptoms and signs involving the genitourinary system: Secondary | ICD-10-CM | POA: Diagnosis not present

## 2017-06-16 ENCOUNTER — Inpatient Hospital Stay: Payer: Medicare Other | Attending: Hematology and Oncology

## 2017-06-16 ENCOUNTER — Inpatient Hospital Stay: Payer: Medicare Other

## 2017-06-16 DIAGNOSIS — N183 Chronic kidney disease, stage 3 unspecified: Secondary | ICD-10-CM

## 2017-06-16 DIAGNOSIS — D631 Anemia in chronic kidney disease: Secondary | ICD-10-CM

## 2017-06-16 DIAGNOSIS — E538 Deficiency of other specified B group vitamins: Secondary | ICD-10-CM | POA: Diagnosis not present

## 2017-06-16 DIAGNOSIS — N189 Chronic kidney disease, unspecified: Secondary | ICD-10-CM

## 2017-06-16 LAB — CBC WITH DIFFERENTIAL/PLATELET
Basophils Absolute: 0 10*3/uL (ref 0.0–0.1)
Basophils Relative: 0 %
Eosinophils Absolute: 0.3 10*3/uL (ref 0.0–0.5)
Eosinophils Relative: 6 %
HCT: 39.5 % (ref 34.8–46.6)
Hemoglobin: 12.4 g/dL (ref 11.6–15.9)
Lymphocytes Relative: 35 %
Lymphs Abs: 2 10*3/uL (ref 0.9–3.3)
MCH: 26.1 pg (ref 25.1–34.0)
MCHC: 31.4 g/dL — ABNORMAL LOW (ref 31.5–36.0)
MCV: 83.2 fL (ref 79.5–101.0)
Monocytes Absolute: 0.4 10*3/uL (ref 0.1–0.9)
Monocytes Relative: 7 %
Neutro Abs: 3 10*3/uL (ref 1.5–6.5)
Neutrophils Relative %: 52 %
Platelets: 185 10*3/uL (ref 145–400)
RBC: 4.75 MIL/uL (ref 3.70–5.45)
RDW: 16.8 % — ABNORMAL HIGH (ref 11.2–14.5)
WBC: 5.8 10*3/uL (ref 3.9–10.3)

## 2017-06-16 MED ORDER — DARBEPOETIN ALFA 200 MCG/0.4ML IJ SOSY
PREFILLED_SYRINGE | INTRAMUSCULAR | Status: AC
  Filled 2017-06-16: qty 0.4

## 2017-06-16 MED ORDER — CYANOCOBALAMIN 1000 MCG/ML IJ SOLN
1000.0000 ug | Freq: Once | INTRAMUSCULAR | Status: AC
  Administered 2017-06-16: 1000 ug via INTRAMUSCULAR

## 2017-06-16 NOTE — Patient Instructions (Signed)
Cyanocobalamin, Vitamin B12 injection What is this medicine? CYANOCOBALAMIN (sye an oh koe BAL a min) is a man made form of vitamin B12. Vitamin B12 is used in the growth of healthy blood cells, nerve cells, and proteins in the body. It also helps with the metabolism of fats and carbohydrates. This medicine is used to treat people who can not absorb vitamin B12. This medicine may be used for other purposes; ask your health care provider or pharmacist if you have questions. COMMON BRAND NAME(S): B-12 Compliance Kit, B-12 Injection Kit, Cyomin, LA-12, Nutri-Twelve, Physicians EZ Use B-12, Primabalt What should I tell my health care provider before I take this medicine? They need to know if you have any of these conditions: -kidney disease -Leber's disease -megaloblastic anemia -an unusual or allergic reaction to cyanocobalamin, cobalt, other medicines, foods, dyes, or preservatives -pregnant or trying to get pregnant -breast-feeding How should I use this medicine? This medicine is injected into a muscle or deeply under the skin. It is usually given by a health care professional in a clinic or doctor's office. However, your doctor may teach you how to inject yourself. Follow all instructions. Talk to your pediatrician regarding the use of this medicine in children. Special care may be needed. Overdosage: If you think you have taken too much of this medicine contact a poison control center or emergency room at once. NOTE: This medicine is only for you. Do not share this medicine with others. What if I miss a dose? If you are given your dose at a clinic or doctor's office, call to reschedule your appointment. If you give your own injections and you miss a dose, take it as soon as you can. If it is almost time for your next dose, take only that dose. Do not take double or extra doses. What may interact with this medicine? -colchicine -heavy alcohol intake This list may not describe all possible  interactions. Give your health care provider a list of all the medicines, herbs, non-prescription drugs, or dietary supplements you use. Also tell them if you smoke, drink alcohol, or use illegal drugs. Some items may interact with your medicine. What should I watch for while using this medicine? Visit your doctor or health care professional regularly. You may need blood work done while you are taking this medicine. You may need to follow a special diet. Talk to your doctor. Limit your alcohol intake and avoid smoking to get the best benefit. What side effects may I notice from receiving this medicine? Side effects that you should report to your doctor or health care professional as soon as possible: -allergic reactions like skin rash, itching or hives, swelling of the face, lips, or tongue -blue tint to skin -chest tightness, pain -difficulty breathing, wheezing -dizziness -red, swollen painful area on the leg Side effects that usually do not require medical attention (report to your doctor or health care professional if they continue or are bothersome): -diarrhea -headache This list may not describe all possible side effects. Call your doctor for medical advice about side effects. You may report side effects to FDA at 1-800-FDA-1088. Where should I keep my medicine? Keep out of the reach of children. Store at room temperature between 15 and 30 degrees C (59 and 85 degrees F). Protect from light. Throw away any unused medicine after the expiration date. NOTE: This sheet is a summary. It may not cover all possible information. If you have questions about this medicine, talk to your doctor, pharmacist, or   health care provider.  2018 Elsevier/Gold Standard (2007-06-22 22:10:20)  

## 2017-06-16 NOTE — Progress Notes (Signed)
Pt Hgb is 12.4 today no aranesp injection needed reviewed labs and gave B12 injection that was due

## 2017-07-02 ENCOUNTER — Ambulatory Visit (INDEPENDENT_AMBULATORY_CARE_PROVIDER_SITE_OTHER): Payer: Medicare Other | Admitting: Podiatry

## 2017-07-02 ENCOUNTER — Encounter: Payer: Self-pay | Admitting: Podiatry

## 2017-07-02 DIAGNOSIS — B351 Tinea unguium: Secondary | ICD-10-CM

## 2017-07-02 DIAGNOSIS — M79676 Pain in unspecified toe(s): Secondary | ICD-10-CM

## 2017-07-02 DIAGNOSIS — E1149 Type 2 diabetes mellitus with other diabetic neurological complication: Secondary | ICD-10-CM

## 2017-07-02 NOTE — Progress Notes (Signed)
Complaint:  Visit Type: Patient returns to my office for continued preventative foot care services. Complaint: Patient states" my nails have grown long and thick and become painful to walk and wear shoes" Patient has been diagnosed with DM with no foot complications. The patient presents for preventative foot care services. No changes to ROS  Podiatric Exam: Vascular: dorsalis pedis and posterior tibial pulses are palpable bilateral. Capillary return is immediate. Temperature gradient is WNL. Skin turgor WNL  Sensorium: Normal Semmes Weinstein monofilament test. Normal tactile sensation bilaterally. Nail Exam: Pt has thick disfigured discolored nails with subungual debris noted bilateral entire nail hallux through fifth toenails Ulcer Exam: There is no evidence of ulcer or pre-ulcerative changes or infection. Orthopedic Exam: Muscle tone and strength are WNL. No limitations in general ROM. No crepitus or effusions noted. Foot type and digits show no abnormalities. Bony prominences are unremarkable. Skin: No Porokeratosis. No infection or ulcers  Diagnosis:  Onychomycosis, , Pain in right toe, pain in left toes  Treatment & Plan Procedures and Treatment: Consent by patient was obtained for treatment procedures.   Debridement of mycotic and hypertrophic toenails, 1 through 5 bilateral and clearing of subungual debris. No ulceration, no infection noted. ABN signed for 2019 Return Visit-Office Procedure: Patient instructed to return to the office for a follow up visit 3 months for continued evaluation and treatment.    Miyu Fenderson DPM 

## 2017-07-16 DIAGNOSIS — S72002D Fracture of unspecified part of neck of left femur, subsequent encounter for closed fracture with routine healing: Secondary | ICD-10-CM | POA: Diagnosis not present

## 2017-07-21 ENCOUNTER — Inpatient Hospital Stay (HOSPITAL_BASED_OUTPATIENT_CLINIC_OR_DEPARTMENT_OTHER): Payer: Medicare Other | Admitting: Hematology and Oncology

## 2017-07-21 ENCOUNTER — Inpatient Hospital Stay: Payer: Medicare Other

## 2017-07-21 ENCOUNTER — Telehealth: Payer: Self-pay | Admitting: Hematology and Oncology

## 2017-07-21 ENCOUNTER — Inpatient Hospital Stay: Payer: Medicare Other | Attending: Hematology and Oncology

## 2017-07-21 DIAGNOSIS — Z9189 Other specified personal risk factors, not elsewhere classified: Secondary | ICD-10-CM

## 2017-07-21 DIAGNOSIS — N189 Chronic kidney disease, unspecified: Secondary | ICD-10-CM

## 2017-07-21 DIAGNOSIS — E538 Deficiency of other specified B group vitamins: Secondary | ICD-10-CM | POA: Insufficient documentation

## 2017-07-21 DIAGNOSIS — N183 Chronic kidney disease, stage 3 unspecified: Secondary | ICD-10-CM

## 2017-07-21 DIAGNOSIS — D631 Anemia in chronic kidney disease: Secondary | ICD-10-CM

## 2017-07-21 LAB — CBC WITH DIFFERENTIAL/PLATELET
Basophils Absolute: 0 10*3/uL (ref 0.0–0.1)
Basophils Relative: 1 %
Eosinophils Absolute: 0.3 10*3/uL (ref 0.0–0.5)
Eosinophils Relative: 5 %
HCT: 35.7 % (ref 34.8–46.6)
Hemoglobin: 11.4 g/dL — ABNORMAL LOW (ref 11.6–15.9)
Lymphocytes Relative: 34 %
Lymphs Abs: 1.8 10*3/uL (ref 0.9–3.3)
MCH: 25.7 pg (ref 25.1–34.0)
MCHC: 31.9 g/dL (ref 31.5–36.0)
MCV: 80.6 fL (ref 79.5–101.0)
Monocytes Absolute: 0.4 10*3/uL (ref 0.1–0.9)
Monocytes Relative: 8 %
Neutro Abs: 2.7 10*3/uL (ref 1.5–6.5)
Neutrophils Relative %: 52 %
Platelets: 194 10*3/uL (ref 145–400)
RBC: 4.43 MIL/uL (ref 3.70–5.45)
RDW: 17.5 % — ABNORMAL HIGH (ref 11.2–14.5)
WBC: 5.2 10*3/uL (ref 3.9–10.3)

## 2017-07-21 MED ORDER — CYANOCOBALAMIN 1000 MCG/ML IJ SOLN
1000.0000 ug | Freq: Once | INTRAMUSCULAR | Status: AC
  Administered 2017-07-21: 1000 ug via INTRAMUSCULAR

## 2017-07-21 MED ORDER — DARBEPOETIN ALFA 200 MCG/0.4ML IJ SOSY
PREFILLED_SYRINGE | INTRAMUSCULAR | Status: AC
  Filled 2017-07-21: qty 0.4

## 2017-07-21 NOTE — Progress Notes (Signed)
Hemoglobin noted at 11.4, Aranesp injection held per orders.

## 2017-07-21 NOTE — Progress Notes (Signed)
Patient Care Team: [name], MD as PCP - General (Internal Medicine) Elayne Snare, MD as Attending Physician (Endocrinology) Jackolyn Confer, MD as Attending Physician (General Surgery) Jovita Gamma, MD as Attending Physician (Neurosurgery) [name], MD as Consulting Physician (Hematology and Oncology)  DIAGNOSIS:  Encounter Diagnosis  Name Primary?  . At high risk for breast cancer     CHIEF COMPLIANT: Itching around the left breast nipple  INTERVAL HISTORY: Abigail Hoover is a 72 year old with above-mentioned history of high risk for breast cancer who is here for follow-up.  She is complaining of continuous itching of the left nipple.  This is the same site where she had previous biopsies and excisions for benign conditions.  Last biopsy was done 2 years ago and it showed spongiotic dermatitis.  REVIEW OF SYSTEMS:   Constitutional: Denies fevers, chills or abnormal weight loss Eyes: Denies blurriness of vision Ears, nose, mouth, throat, and face: Denies mucositis or sore throat Respiratory: Denies cough, dyspnea or wheezes Cardiovascular: Denies palpitation, chest discomfort Gastrointestinal:  Denies nausea, heartburn or change in bowel habits Skin: Denies abnormal skin rashes Lymphatics: Denies new lymphadenopathy or easy bruising Neurological:Denies numbness, tingling or new weaknesses Behavioral/Psych: Mood is stable, no new changes  Extremities: No lower extremity edema Breast: Left nipple itching All other systems were reviewed with the patient and are negative.  I have reviewed the past medical history, past surgical history, social history and family history with the patient and they are unchanged from previous note.  ALLERGIES:  is allergic to atorvastatin and ezetimibe.  MEDICATIONS:  Current Outpatient Medications  Medication Sig Dispense Refill  . acarbose (PRECOSE) 50 MG tablet 1 tablet at breakfast, a half tablet at lunch and 2 tablets at dinner  105 tablet 0  . amLODipine (NORVASC) 2.5 MG tablet Take 2.5 mg by mouth daily.  3  . aspirin 81 MG EC tablet Take 1 tablet (81 mg total) by mouth 2 (two) times daily with a meal. For 6 weeks    . DEXILANT 60 MG capsule     . furosemide (LASIX) 40 MG tablet Take 40 mg by mouth every morning.  7  . gabapentin (NEURONTIN) 300 MG capsule Take 300 mg by mouth 4 (four) times daily.    Marland Kitchen glucose blood (ONETOUCH VERIO) test strip Use as instructed 100 each 12  . hydrocortisone 2.5 % ointment APPLY TO AFFECED ARES(S) TOPICALLY TWICE DAILY AS NEEDED  3  . ibandronate (BONIVA) 150 MG tablet     . isosorbide mononitrate (IMDUR) 60 MG 24 hr tablet Take 60 mg by mouth daily.  3  . JANUMET 50-500 MG tablet TAKE ONE TABLET BY MOUTH TWICE DAILY WITH A MEAL 180 tablet 0  . Lancets (ONETOUCH ULTRASOFT) lancets Use as instructed 100 each 12  . Lancets Misc. (ONE TOUCH SURESOFT) MISC Use to check blood sugars 1 each 3  . magnesium oxide (MAG-OX) 400 MG tablet Take 400 mg by mouth 2 (two) times daily.    . metoprolol succinate (TOPROL-XL) 50 MG 24 hr tablet Take 1 tablet (50 mg total) by mouth daily. Take with or immediately following a meal. 30 tablet 3  . MYRBETRIQ 50 MG TB24 tablet Take 50 mg by mouth daily.  3  . nitroGLYCERIN (NITROSTAT) 0.4 MG SL tablet Place 1 tablet (0.4 mg total) under the tongue every 5 (five) minutes x 3 doses as needed for chest pain. 25 tablet 12  . pentosan polysulfate (ELMIRON) 100 MG capsule Take 100 mg by  mouth 3 (three) times daily before meals.     . pravastatin (PRAVACHOL) 20 MG tablet TAKE ONE TABLET BY MOUTH ONCE DAILY 90 tablet 1  . SYNTHROID 75 MCG tablet TAKE ONE TABLET BY MOUTH ONCE DAILY 90 tablet 1  . triamcinolone ointment (KENALOG) 0.1 % APPLY THIN LAYER ON THE SKIN TWICE A DAY FOR 3 WEEKS  0   No current facility-administered medications for this visit.     PHYSICAL EXAMINATION: ECOG PERFORMANCE STATUS: 1 - Symptomatic but completely ambulatory  Vitals:    07/21/17 1454  BP: 116/72  Pulse: (!) 55  Resp: 17  Temp: 97.6 F (36.4 C)  SpO2: 100%   Filed Weights   07/21/17 1454  Weight: 116 lb 4.8 oz (52.8 kg)    GENERAL:alert, no distress and comfortable SKIN: skin color, texture, turgor are normal, no rashes or significant lesions EYES: normal, Conjunctiva are pink and non-injected, sclera clear OROPHARYNX:no exudate, no erythema and lips, buccal mucosa, and tongue normal  NECK: supple, thyroid normal size, non-tender, without nodularity LYMPH:  no palpable lymphadenopathy in the cervical, axillary or inguinal LUNGS: clear to auscultation and percussion with normal breathing effort HEART: regular rate & rhythm and no murmurs and no lower extremity edema ABDOMEN:abdomen soft, non-tender and normal bowel sounds MUSCULOSKELETAL:no cyanosis of digits and no clubbing  NEURO: alert & oriented x 3 with fluent speech, no focal motor/sensory deficits EXTREMITIES: No lower extremity edema BREAST: No palpable lumps or nodules of concern.  (exam performed in the presence of a chaperone)  LABORATORY DATA:  I have reviewed the data as listed CMP Latest Ref Rng & Units 12/20/2016 12/12/2016 10/09/2016  Glucose 70 - 99 mg/dL 103(H) 149(H) 94  BUN 6 - 23 mg/dL 29(H) 23.4 27(H)  Creatinine 0.40 - 1.20 mg/dL 1.58(H) 1.7(H) 1.59(H)  Sodium 135 - 145 mEq/L 134(L) 138 134(L)  Potassium 3.5 - 5.1 mEq/L 4.6 4.6 5.0  Chloride 96 - 112 mEq/L 98 - 99  CO2 19 - 32 mEq/L 30 31(H) 29  Calcium 8.4 - 10.5 mg/dL 10.0 11.4(H) 11.6(H)  Total Protein 6.4 - 8.3 g/dL - 6.5 6.3  Total Bilirubin 0.20 - 1.20 mg/dL - 0.36 0.6  Alkaline Phos 40 - 150 U/L - 65 45  AST 5 - 34 U/L - 19 14  ALT 0 - 55 U/L - 10 7    Lab Results  Component Value Date   WBC 5.2 07/21/2017   HGB 11.4 (L) 07/21/2017   HCT 35.7 07/21/2017   MCV 80.6 07/21/2017   PLT 194 07/21/2017   NEUTROABS 2.7 07/21/2017    ASSESSMENT & PLAN:  At high risk for breast cancer High risk breast cancer  evaluation: Based upon the American Cancer Society risk adapted tool, patient has a 4.8% lifetime risk of breast cancer and a 1.8% 5-year risk of breast cancer.  Based on these numbers, I did not believe that she has High enough risk to warrant antiestrogen therapy for prophylaxis. It is for this reason I did not recommend tamoxifen therapy.  She wanted to see me once a year at least for her breast evaluations.  I recommended that she get mammograms every June and follow-up with [place] Once a year.   All questions were answered. The patient knows to call the clinic with any problems, questions or concerns.  No orders of the defined types were placed in this encounter.  The patient has a good understanding of the overall plan. she agrees with it. she will call  with any problems that may develop before the next visit here.   Harriette Ohara, MD 07/21/17

## 2017-07-21 NOTE — Assessment & Plan Note (Signed)
High risk breast cancer evaluation: Based upon the American Cancer Society risk adapted tool, patient has a 4.8% lifetime risk of breast cancer and a 1.8% 5-year risk of breast cancer.  Based on these numbers, I did not believe that she has High enough risk to warrant antiestrogen therapy for prophylaxis. It is for this reason I did not recommend tamoxifen therapy.  She wanted to see me once a year at least for her breast evaluations.  I recommended that she get mammograms every June and follow-up with [place] every December.  She is agreeable and wishes to follow-up in December 2019 back to see [place]. All questions were answered. The patient knows to call the clinic with any problems, questions or concerns.

## 2017-07-21 NOTE — Patient Instructions (Signed)
Cyanocobalamin, Vitamin B12 injection What is this medicine? CYANOCOBALAMIN (sye an oh koe BAL a min) is a man made form of vitamin B12. Vitamin B12 is used in the growth of healthy blood cells, nerve cells, and proteins in the body. It also helps with the metabolism of fats and carbohydrates. This medicine is used to treat people who can not absorb vitamin B12. This medicine may be used for other purposes; ask your health care provider or pharmacist if you have questions. COMMON BRAND NAME(S): B-12 Compliance Kit, B-12 Injection Kit, Cyomin, LA-12, Nutri-Twelve, Physicians EZ Use B-12, Primabalt What should I tell my health care provider before I take this medicine? They need to know if you have any of these conditions: -kidney disease -Leber's disease -megaloblastic anemia -an unusual or allergic reaction to cyanocobalamin, cobalt, other medicines, foods, dyes, or preservatives -pregnant or trying to get pregnant -breast-feeding How should I use this medicine? This medicine is injected into a muscle or deeply under the skin. It is usually given by a health care professional in a clinic or doctor's office. However, your doctor may teach you how to inject yourself. Follow all instructions. Talk to your pediatrician regarding the use of this medicine in children. Special care may be needed. Overdosage: If you think you have taken too much of this medicine contact a poison control center or emergency room at once. NOTE: This medicine is only for you. Do not share this medicine with others. What if I miss a dose? If you are given your dose at a clinic or doctor's office, call to reschedule your appointment. If you give your own injections and you miss a dose, take it as soon as you can. If it is almost time for your next dose, take only that dose. Do not take double or extra doses. What may interact with this medicine? -colchicine -heavy alcohol intake This list may not describe all possible  interactions. Give your health care provider a list of all the medicines, herbs, non-prescription drugs, or dietary supplements you use. Also tell them if you smoke, drink alcohol, or use illegal drugs. Some items may interact with your medicine. What should I watch for while using this medicine? Visit your doctor or health care professional regularly. You may need blood work done while you are taking this medicine. You may need to follow a special diet. Talk to your doctor. Limit your alcohol intake and avoid smoking to get the best benefit. What side effects may I notice from receiving this medicine? Side effects that you should report to your doctor or health care professional as soon as possible: -allergic reactions like skin rash, itching or hives, swelling of the face, lips, or tongue -blue tint to skin -chest tightness, pain -difficulty breathing, wheezing -dizziness -red, swollen painful area on the leg Side effects that usually do not require medical attention (report to your doctor or health care professional if they continue or are bothersome): -diarrhea -headache This list may not describe all possible side effects. Call your doctor for medical advice about side effects. You may report side effects to FDA at 1-800-FDA-1088. Where should I keep my medicine? Keep out of the reach of children. Store at room temperature between 15 and 30 degrees C (59 and 85 degrees F). Protect from light. Throw away any unused medicine after the expiration date. NOTE: This sheet is a summary. It may not cover all possible information. If you have questions about this medicine, talk to your doctor, pharmacist, or   health care provider.  2018 Elsevier/Gold Standard (2007-06-22 22:10:20)  

## 2017-07-21 NOTE — Telephone Encounter (Signed)
Patient asked to wait for scheduling next appointment. Patient will call to schedule 1 year follow up appointment.

## 2017-07-29 ENCOUNTER — Other Ambulatory Visit: Payer: Self-pay | Admitting: Endocrinology

## 2017-07-31 DIAGNOSIS — N390 Urinary tract infection, site not specified: Secondary | ICD-10-CM | POA: Diagnosis not present

## 2017-07-31 DIAGNOSIS — B9620 Unspecified Escherichia coli [E. coli] as the cause of diseases classified elsewhere: Secondary | ICD-10-CM | POA: Diagnosis not present

## 2017-07-31 DIAGNOSIS — N3010 Interstitial cystitis (chronic) without hematuria: Secondary | ICD-10-CM | POA: Diagnosis not present

## 2017-08-06 DIAGNOSIS — M25431 Effusion, right wrist: Secondary | ICD-10-CM | POA: Insufficient documentation

## 2017-08-06 DIAGNOSIS — M25531 Pain in right wrist: Secondary | ICD-10-CM | POA: Insufficient documentation

## 2017-08-12 DIAGNOSIS — K146 Glossodynia: Secondary | ICD-10-CM | POA: Diagnosis not present

## 2017-08-19 DIAGNOSIS — Z981 Arthrodesis status: Secondary | ICD-10-CM | POA: Diagnosis not present

## 2017-08-19 DIAGNOSIS — M546 Pain in thoracic spine: Secondary | ICD-10-CM | POA: Diagnosis not present

## 2017-08-19 DIAGNOSIS — M47816 Spondylosis without myelopathy or radiculopathy, lumbar region: Secondary | ICD-10-CM | POA: Diagnosis not present

## 2017-08-19 DIAGNOSIS — M5030 Other cervical disc degeneration, unspecified cervical region: Secondary | ICD-10-CM | POA: Diagnosis not present

## 2017-08-19 DIAGNOSIS — M542 Cervicalgia: Secondary | ICD-10-CM | POA: Diagnosis not present

## 2017-08-20 ENCOUNTER — Encounter: Payer: Self-pay | Admitting: *Deleted

## 2017-08-20 NOTE — Progress Notes (Signed)
[place] [employer]  [employer] representative received phone call from patient requesting assistance with transportation to her upcoming appointment.  CSW and patient reviewed transportation resources and patient was agreeable to the [facility] transportation program.  CSW enrolled patient and scheduled transportation for 08/25/17.  CSW educated patient and patient's husband on transportation program and confirmation process.  Patient plans to meet with CSW on 08/25/17 after appointment to schedule ride home.  CSW encouraged patient and husband to call with questions or concerns.    Johnnye Lana, MSW, LCSW, OSW-C Clinical Social Worker [facility] [phone]

## 2017-08-21 DIAGNOSIS — N644 Mastodynia: Secondary | ICD-10-CM | POA: Diagnosis not present

## 2017-08-22 ENCOUNTER — Encounter: Payer: Self-pay | Admitting: General Practice

## 2017-08-22 DIAGNOSIS — N3000 Acute cystitis without hematuria: Secondary | ICD-10-CM | POA: Diagnosis not present

## 2017-08-22 NOTE — Progress Notes (Signed)
[place] CSW Progress Note  Call from husband, wanting to make sure patient has ride to appointment on Mon June 3.  CSW verified ride previously arranged by CSW [place].  Used Hindi interpreter [phone]) to call patient to discuss plan.  Husband states wife is on another phone, husband prefers to speak [place].  States he confirmed ride this morning, CSW reviewed arrangements for ride home upon completion of appt.  No further questions at this time.  Edwyna Shell, LCSW Clinical Social Worker Phone:  [phone]

## 2017-08-25 ENCOUNTER — Inpatient Hospital Stay: Payer: Medicare Other | Attending: Hematology and Oncology

## 2017-08-25 ENCOUNTER — Inpatient Hospital Stay: Payer: Medicare Other

## 2017-08-25 DIAGNOSIS — N189 Chronic kidney disease, unspecified: Secondary | ICD-10-CM | POA: Insufficient documentation

## 2017-08-25 DIAGNOSIS — R5383 Other fatigue: Secondary | ICD-10-CM | POA: Insufficient documentation

## 2017-08-25 DIAGNOSIS — E538 Deficiency of other specified B group vitamins: Secondary | ICD-10-CM | POA: Diagnosis not present

## 2017-08-25 DIAGNOSIS — D539 Nutritional anemia, unspecified: Secondary | ICD-10-CM | POA: Insufficient documentation

## 2017-08-25 DIAGNOSIS — N183 Chronic kidney disease, stage 3 unspecified: Secondary | ICD-10-CM

## 2017-08-25 DIAGNOSIS — D631 Anemia in chronic kidney disease: Secondary | ICD-10-CM

## 2017-08-25 LAB — CBC WITH DIFFERENTIAL/PLATELET
Basophils Absolute: 0 10*3/uL (ref 0.0–0.1)
Basophils Relative: 0 %
Eosinophils Absolute: 0.3 10*3/uL (ref 0.0–0.5)
Eosinophils Relative: 6 %
HCT: 29 % — ABNORMAL LOW (ref 34.8–46.6)
Hemoglobin: 9.5 g/dL — ABNORMAL LOW (ref 11.6–15.9)
Lymphocytes Relative: 30 %
Lymphs Abs: 1.6 10*3/uL (ref 0.9–3.3)
MCH: 26.7 pg (ref 25.1–34.0)
MCHC: 32.5 g/dL (ref 31.5–36.0)
MCV: 82.2 fL (ref 79.5–101.0)
Monocytes Absolute: 0.3 10*3/uL (ref 0.1–0.9)
Monocytes Relative: 6 %
Neutro Abs: 3 10*3/uL (ref 1.5–6.5)
Neutrophils Relative %: 58 %
Platelets: 158 10*3/uL (ref 145–400)
RBC: 3.53 MIL/uL — ABNORMAL LOW (ref 3.70–5.45)
RDW: 15.3 % — ABNORMAL HIGH (ref 11.2–14.5)
WBC: 5.2 10*3/uL (ref 3.9–10.3)

## 2017-08-25 MED ORDER — DARBEPOETIN ALFA 200 MCG/0.4ML IJ SOSY
PREFILLED_SYRINGE | INTRAMUSCULAR | Status: AC
  Filled 2017-08-25: qty 0.4

## 2017-08-25 MED ORDER — DARBEPOETIN ALFA 200 MCG/0.4ML IJ SOSY
200.0000 ug | PREFILLED_SYRINGE | Freq: Once | INTRAMUSCULAR | Status: AC
  Administered 2017-08-25: 200 ug via SUBCUTANEOUS

## 2017-08-25 MED ORDER — CYANOCOBALAMIN 1000 MCG/ML IJ SOLN
1000.0000 ug | Freq: Once | INTRAMUSCULAR | Status: AC
  Administered 2017-08-25: 1000 ug via INTRAMUSCULAR

## 2017-08-25 NOTE — Patient Instructions (Signed)
Cyanocobalamin, Vitamin B12 injection What is this medicine? CYANOCOBALAMIN (sye an oh koe BAL a min) is a man made form of vitamin B12. Vitamin B12 is used in the growth of healthy blood cells, nerve cells, and proteins in the body. It also helps with the metabolism of fats and carbohydrates. This medicine is used to treat people who can not absorb vitamin B12. This medicine may be used for other purposes; ask your health care provider or pharmacist if you have questions. COMMON BRAND NAME(S): B-12 Compliance Kit, B-12 Injection Kit, Cyomin, LA-12, Nutri-Twelve, Physicians EZ Use B-12, Primabalt What should I tell my health care provider before I take this medicine? They need to know if you have any of these conditions: -kidney disease -Leber's disease -megaloblastic anemia -an unusual or allergic reaction to cyanocobalamin, cobalt, other medicines, foods, dyes, or preservatives -pregnant or trying to get pregnant -breast-feeding How should I use this medicine? This medicine is injected into a muscle or deeply under the skin. It is usually given by a health care professional in a clinic or doctor's office. However, your doctor may teach you how to inject yourself. Follow all instructions. Talk to your pediatrician regarding the use of this medicine in children. Special care may be needed. Overdosage: If you think you have taken too much of this medicine contact a poison control center or emergency room at once. NOTE: This medicine is only for you. Do not share this medicine with others. What if I miss a dose? If you are given your dose at a clinic or doctor's office, call to reschedule your appointment. If you give your own injections and you miss a dose, take it as soon as you can. If it is almost time for your next dose, take only that dose. Do not take double or extra doses. What may interact with this medicine? -colchicine -heavy alcohol intake This list may not describe all possible  interactions. Give your health care provider a list of all the medicines, herbs, non-prescription drugs, or dietary supplements you use. Also tell them if you smoke, drink alcohol, or use illegal drugs. Some items may interact with your medicine. What should I watch for while using this medicine? Visit your doctor or health care professional regularly. You may need blood work done while you are taking this medicine. You may need to follow a special diet. Talk to your doctor. Limit your alcohol intake and avoid smoking to get the best benefit. What side effects may I notice from receiving this medicine? Side effects that you should report to your doctor or health care professional as soon as possible: -allergic reactions like skin rash, itching or hives, swelling of the face, lips, or tongue -blue tint to skin -chest tightness, pain -difficulty breathing, wheezing -dizziness -red, swollen painful area on the leg Side effects that usually do not require medical attention (report to your doctor or health care professional if they continue or are bothersome): -diarrhea -headache This list may not describe all possible side effects. Call your doctor for medical advice about side effects. You may report side effects to FDA at 1-800-FDA-1088. Where should I keep my medicine? Keep out of the reach of children. Store at room temperature between 15 and 30 degrees C (59 and 85 degrees F). Protect from light. Throw away any unused medicine after the expiration date. NOTE: This sheet is a summary. It may not cover all possible information. If you have questions about this medicine, talk to your doctor, pharmacist, or   health care provider.  2018 Elsevier/Gold Standard (2007-06-22 22:10:20) Darbepoetin Alfa injection What is this medicine? DARBEPOETIN ALFA (dar be POE e tin AL fa) helps your body make more red blood cells. It is used to treat anemia caused by chronic kidney failure and chemotherapy. This  medicine may be used for other purposes; ask your health care provider or pharmacist if you have questions. COMMON BRAND NAME(S): Aranesp What should I tell my health care provider before I take this medicine? They need to know if you have any of these conditions: -blood clotting disorders or history of blood clots -cancer patient not on chemotherapy -cystic fibrosis -heart disease, such as angina, heart failure, or a history of a heart attack -hemoglobin level of 12 g/dL or greater -high blood pressure -low levels of folate, iron, or vitamin B12 -seizures -an unusual or allergic reaction to darbepoetin, erythropoietin, albumin, hamster proteins, latex, other medicines, foods, dyes, or preservatives -pregnant or trying to get pregnant -breast-feeding How should I use this medicine? This medicine is for injection into a vein or under the skin. It is usually given by a health care professional in a hospital or clinic setting. If you get this medicine at home, you will be taught how to prepare and give this medicine. Use exactly as directed. Take your medicine at regular intervals. Do not take your medicine more often than directed. It is important that you put your used needles and syringes in a special sharps container. Do not put them in a trash can. If you do not have a sharps container, call your pharmacist or healthcare provider to get one. A special MedGuide will be given to you by the pharmacist with each prescription and refill. Be sure to read this information carefully each time. Talk to your pediatrician regarding the use of this medicine in children. While this medicine may be used in children as young as 1 year for selected conditions, precautions do apply. Overdosage: If you think you have taken too much of this medicine contact a poison control center or emergency room at once. NOTE: This medicine is only for you. Do not share this medicine with others. What if I miss a dose? If  you miss a dose, take it as soon as you can. If it is almost time for your next dose, take only that dose. Do not take double or extra doses. What may interact with this medicine? Do not take this medicine with any of the following medications: -epoetin alfa This list may not describe all possible interactions. Give your health care provider a list of all the medicines, herbs, non-prescription drugs, or dietary supplements you use. Also tell them if you smoke, drink alcohol, or use illegal drugs. Some items may interact with your medicine. What should I watch for while using this medicine? Your condition will be monitored carefully while you are receiving this medicine. You may need blood work done while you are taking this medicine. What side effects may I notice from receiving this medicine? Side effects that you should report to your doctor or health care professional as soon as possible: -allergic reactions like skin rash, itching or hives, swelling of the face, lips, or tongue -breathing problems -changes in vision -chest pain -confusion, trouble speaking or understanding -feeling faint or lightheaded, falls -high blood pressure -muscle aches or pains -pain, swelling, warmth in the leg -rapid weight gain -severe headaches -sudden numbness or weakness of the face, arm or leg -trouble walking, dizziness, loss of balance   or coordination -seizures (convulsions) -swelling of the ankles, feet, hands -unusually weak or tired Side effects that usually do not require medical attention (report to your doctor or health care professional if they continue or are bothersome): -diarrhea -fever, chills (flu-like symptoms) -headaches -nausea, vomiting -redness, stinging, or swelling at site where injected This list may not describe all possible side effects. Call your doctor for medical advice about side effects. You may report side effects to FDA at 1-800-FDA-1088. Where should I keep my  medicine? Keep out of the reach of children. Store in a refrigerator between 2 and 8 degrees C (36 and 46 degrees F). Do not freeze. Do not shake. Throw away any unused portion if using a single-dose vial. Throw away any unused medicine after the expiration date. NOTE: This sheet is a summary. It may not cover all possible information. If you have questions about this medicine, talk to your doctor, pharmacist, or health care provider.  2018 Elsevier/Gold Standard (2015-10-30 19:52:26)

## 2017-08-25 NOTE — Progress Notes (Signed)
Completed on 08/25/2017

## 2017-08-26 ENCOUNTER — Other Ambulatory Visit: Payer: Self-pay | Admitting: General Surgery

## 2017-08-26 ENCOUNTER — Telehealth: Payer: Self-pay | Admitting: Endocrinology

## 2017-08-26 DIAGNOSIS — N644 Mastodynia: Secondary | ICD-10-CM

## 2017-08-26 NOTE — Telephone Encounter (Signed)
Beola Cord from [place] need clarification for medication Acarbose. please advise [phone]

## 2017-08-26 NOTE — Telephone Encounter (Signed)
Pharmacy called and provided clarification.

## 2017-09-01 ENCOUNTER — Other Ambulatory Visit: Payer: Medicare Other

## 2017-09-01 DIAGNOSIS — M7918 Myalgia, other site: Secondary | ICD-10-CM | POA: Diagnosis not present

## 2017-09-01 DIAGNOSIS — R350 Frequency of micturition: Secondary | ICD-10-CM | POA: Diagnosis not present

## 2017-09-01 DIAGNOSIS — R3915 Urgency of urination: Secondary | ICD-10-CM | POA: Diagnosis not present

## 2017-09-01 DIAGNOSIS — N3010 Interstitial cystitis (chronic) without hematuria: Secondary | ICD-10-CM | POA: Diagnosis not present

## 2017-09-02 ENCOUNTER — Ambulatory Visit: Payer: Medicare Other

## 2017-09-02 ENCOUNTER — Ambulatory Visit
Admission: RE | Admit: 2017-09-02 | Discharge: 2017-09-02 | Disposition: A | Discharge status: Home / Self Care | Payer: Medicare Other | Source: Ambulatory Visit | Attending: General Surgery | Admitting: General Surgery

## 2017-09-02 DIAGNOSIS — N644 Mastodynia: Secondary | ICD-10-CM

## 2017-09-02 DIAGNOSIS — R922 Inconclusive mammogram: Secondary | ICD-10-CM | POA: Diagnosis not present

## 2017-09-09 ENCOUNTER — Other Ambulatory Visit: Payer: Self-pay | Admitting: Hematology and Oncology

## 2017-09-09 ENCOUNTER — Telehealth: Payer: Self-pay | Admitting: *Deleted

## 2017-09-09 DIAGNOSIS — D539 Nutritional anemia, unspecified: Secondary | ICD-10-CM

## 2017-09-09 DIAGNOSIS — E538 Deficiency of other specified B group vitamins: Secondary | ICD-10-CM

## 2017-09-09 NOTE — Telephone Encounter (Signed)
Spoke with patient. Did not have any bleeding with the cystoscopy. Is available any time to see Dr Alvy Bimler. Message to scheduler.

## 2017-09-09 NOTE — Telephone Encounter (Signed)
-----   Message from [name], MD sent at 09/09/2017  2:38 PM EDT ----- Regarding: RE: Pt Call She just got B12 inj and Aranesp 2 weeks ago I do not believe she needs repeat inj soon She just had bladder surgery recently. Did she bleed much? I can certainly see her soon Can you ask what time/date works better for her and place scheduling msg.   ----- Message ----- From: Patton Salles, RN Sent: 09/09/2017   2:27 PM To: [name], MD Subject: FW: Pt Call                                      ----- Message ----- From: Thelma Barge May J, RN Sent: 09/09/2017   1:53 PM To: Patton Salles, RN Subject: Pt Call                                        Johnathyn Viscomi,   This pt was transferred to my VM. She had left a message wanting to see if she needs to come in soon. She is feeling very fatigue and would like to come in for blood work. She says she sees Dr.Gorsuch for anemia/b12 deficiency, and recently saw Dr.Gudena, one time for breast cancer risk evaluation.   Thanks May,RN

## 2017-09-10 ENCOUNTER — Encounter: Payer: Self-pay | Admitting: *Deleted

## 2017-09-10 ENCOUNTER — Telehealth: Payer: Self-pay | Admitting: Hematology and Oncology

## 2017-09-10 ENCOUNTER — Encounter: Payer: Self-pay | Admitting: Endocrinology

## 2017-09-10 DIAGNOSIS — N183 Chronic kidney disease, stage 3 (moderate): Secondary | ICD-10-CM | POA: Diagnosis not present

## 2017-09-10 NOTE — Progress Notes (Signed)
Scheduled ride through transportation program for 09/18/17 appointment. Patient understands she needs to contact CSW on day of appointment to schedule ride home.  Maryjean Morn, MSW, LCSW, OSW-C Clinical Social Worker [facility] [phone]

## 2017-09-10 NOTE — Telephone Encounter (Signed)
Scheduled appt per 6/18 sch msg - spoke w/ pt re appts.

## 2017-09-11 DIAGNOSIS — I129 Hypertensive chronic kidney disease with stage 1 through stage 4 chronic kidney disease, or unspecified chronic kidney disease: Secondary | ICD-10-CM | POA: Diagnosis not present

## 2017-09-11 DIAGNOSIS — N3000 Acute cystitis without hematuria: Secondary | ICD-10-CM | POA: Diagnosis not present

## 2017-09-11 DIAGNOSIS — N183 Chronic kidney disease, stage 3 (moderate): Secondary | ICD-10-CM | POA: Diagnosis not present

## 2017-09-11 DIAGNOSIS — N179 Acute kidney failure, unspecified: Secondary | ICD-10-CM | POA: Diagnosis not present

## 2017-09-16 ENCOUNTER — Ambulatory Visit: Payer: Medicare Other | Admitting: Hematology and Oncology

## 2017-09-16 ENCOUNTER — Other Ambulatory Visit: Payer: Medicare Other

## 2017-09-17 DIAGNOSIS — N179 Acute kidney failure, unspecified: Secondary | ICD-10-CM | POA: Diagnosis not present

## 2017-09-18 ENCOUNTER — Ambulatory Visit: Payer: Medicare Other | Admitting: Hematology and Oncology

## 2017-09-18 ENCOUNTER — Inpatient Hospital Stay: Payer: Medicare Other

## 2017-09-18 ENCOUNTER — Telehealth: Payer: Self-pay | Admitting: Hematology and Oncology

## 2017-09-18 ENCOUNTER — Other Ambulatory Visit: Payer: Medicare Other

## 2017-09-18 ENCOUNTER — Inpatient Hospital Stay (HOSPITAL_BASED_OUTPATIENT_CLINIC_OR_DEPARTMENT_OTHER): Payer: Medicare Other | Admitting: Hematology and Oncology

## 2017-09-18 ENCOUNTER — Encounter: Payer: Self-pay | Admitting: Hematology and Oncology

## 2017-09-18 DIAGNOSIS — D539 Nutritional anemia, unspecified: Secondary | ICD-10-CM | POA: Diagnosis not present

## 2017-09-18 DIAGNOSIS — D631 Anemia in chronic kidney disease: Secondary | ICD-10-CM

## 2017-09-18 DIAGNOSIS — N189 Chronic kidney disease, unspecified: Secondary | ICD-10-CM

## 2017-09-18 DIAGNOSIS — E538 Deficiency of other specified B group vitamins: Secondary | ICD-10-CM

## 2017-09-18 DIAGNOSIS — N183 Chronic kidney disease, stage 3 (moderate): Principal | ICD-10-CM

## 2017-09-18 DIAGNOSIS — R5383 Other fatigue: Secondary | ICD-10-CM | POA: Diagnosis not present

## 2017-09-18 LAB — CBC WITH DIFFERENTIAL/PLATELET
Basophils Absolute: 0 10*3/uL (ref 0.0–0.1)
Basophils Relative: 1 %
Eosinophils Absolute: 0.1 10*3/uL (ref 0.0–0.5)
Eosinophils Relative: 3 %
HCT: 31.7 % — ABNORMAL LOW (ref 34.8–46.6)
Hemoglobin: 10.4 g/dL — ABNORMAL LOW (ref 11.6–15.9)
Lymphocytes Relative: 24 %
Lymphs Abs: 1.1 10*3/uL (ref 0.9–3.3)
MCH: 26.7 pg (ref 25.1–34.0)
MCHC: 32.7 g/dL (ref 31.5–36.0)
MCV: 81.7 fL (ref 79.5–101.0)
Monocytes Absolute: 0.2 10*3/uL (ref 0.1–0.9)
Monocytes Relative: 5 %
Neutro Abs: 3.2 10*3/uL (ref 1.5–6.5)
Neutrophils Relative %: 67 %
Platelets: 321 10*3/uL (ref 145–400)
RBC: 3.88 MIL/uL (ref 3.70–5.45)
RDW: 17.5 % — ABNORMAL HIGH (ref 11.2–14.5)
WBC: 4.8 10*3/uL (ref 3.9–10.3)

## 2017-09-18 LAB — IRON AND TIBC
Iron: 78 ug/dL (ref 41–142)
Saturation Ratios: 32 % (ref 21–57)
TIBC: 241 ug/dL (ref 236–444)
UIBC: 164 ug/dL

## 2017-09-18 LAB — VITAMIN B12: Vitamin B-12: 2092 pg/mL — ABNORMAL HIGH (ref 180–914)

## 2017-09-18 LAB — SEDIMENTATION RATE: Sed Rate: 38 mm/hr — ABNORMAL HIGH (ref 0–22)

## 2017-09-18 LAB — FERRITIN: Ferritin: 259 ng/mL (ref 11–307)

## 2017-09-18 MED ORDER — DARBEPOETIN ALFA 200 MCG/0.4ML IJ SOSY
200.0000 ug | PREFILLED_SYRINGE | Freq: Once | INTRAMUSCULAR | Status: AC
  Administered 2017-09-18: 200 ug via SUBCUTANEOUS

## 2017-09-18 MED ORDER — CYANOCOBALAMIN 1000 MCG/ML IJ SOLN
1000.0000 ug | Freq: Once | INTRAMUSCULAR | Status: AC
  Administered 2017-09-18: 1000 ug via INTRAMUSCULAR

## 2017-09-18 NOTE — Assessment & Plan Note (Signed)
She has received B12 injections Her last B12 level was satisfactory She will continue to receive B12 injection

## 2017-09-18 NOTE — Assessment & Plan Note (Signed)
The patient complained of excessive fatigue I do not believe her current state of anemia is the cause of her symptom of excessive fatigue She stated that her blood sugar control at home is satisfactory but her blood pressure appears to be on the low end of normal I noted that her blood pressure is low and she is on 4 different medications that could affect her blood pressure I recommend discontinuation of amlodipine.

## 2017-09-18 NOTE — Assessment & Plan Note (Signed)
We discussed some of the risks, benefits, and alternatives of erythropoietin stimulating agents such as Procrit or Aranesp.  She tolerated injections well  I will space out her darbepoetin injection to every 5 weeks

## 2017-09-18 NOTE — Progress Notes (Signed)
[place] OFFICE PROGRESS NOTE  [name], MD  ASSESSMENT & PLAN:  Vitamin B 12 deficiency She has received B12 injections Her last B12 level was satisfactory She will continue to receive B12 injection  Deficiency anemia We discussed some of the risks, benefits, and alternatives of erythropoietin stimulating agents such as Procrit or Aranesp.  She tolerated injections well  I will space out her darbepoetin injection to every 5 weeks   Other fatigue The patient complained of excessive fatigue I do not believe her current state of anemia is the cause of her symptom of excessive fatigue She stated that her blood sugar control at home is satisfactory but her blood pressure appears to be on the low end of normal I noted that her blood pressure is low and she is on 4 different medications that could affect her blood pressure I recommend discontinuation of amlodipine.   No orders of the defined types were placed in this encounter.   INTERVAL HISTORY: Abigail Hoover 72 y.o. female returns for anemia follow-up She complained of excessive fatigue She denies recent headache or blurriness of vision Her blood sugar control at home is satisfactory Her blood pressure at home typically range around SBP of 115 and DBP around 55 The patient denies any recent signs or symptoms of bleeding such as spontaneous epistaxis, hematuria or hematochezia.  SUMMARY OF HEMATOLOGIC HISTORY:  This is a patient that has been followed here since 2011 for chronic anemia In 2011, she had a bone marrow aspirate and biopsy that came back normal. The patient has been receiving Aranesp injection for chronic anemia up until 2014. It was subsequently discontinued due stability of the anemia. Starting 05/27/2014, darbepoetin was resumed at 200 g every other month to keep hemoglobin greater than 10 g. Treatment was discontinued in October 2016 and resumed in September 2017 The patient is  subsequently found to have severe vitamin B12 deficiency and is started on vitamin B12 injection on December 25, 2016  I have reviewed the past medical history, past surgical history, social history and family history with the patient and they are unchanged from previous note.  ALLERGIES:  is allergic to atorvastatin and ezetimibe.  MEDICATIONS:  Current Outpatient Medications  Medication Sig Dispense Refill  . acarbose (PRECOSE) 50 MG tablet 1 tablet at breakfast, a half tablet at lunch and 2 tablets at dinner 105 tablet 0  . aspirin 81 MG EC tablet Take 1 tablet (81 mg total) by mouth 2 (two) times daily with a meal. For 6 weeks    . DEXILANT 60 MG capsule     . furosemide (LASIX) 40 MG tablet Take 40 mg by mouth every morning.  7  . gabapentin (NEURONTIN) 300 MG capsule Take 300 mg by mouth 4 (four) times daily.    Marland Kitchen glucose blood (ONETOUCH VERIO) test strip Use as instructed 100 each 12  . hydrocortisone 2.5 % ointment APPLY TO AFFECED ARES(S) TOPICALLY TWICE DAILY AS NEEDED  3  . ibandronate (BONIVA) 150 MG tablet     . isosorbide mononitrate (IMDUR) 60 MG 24 hr tablet Take 60 mg by mouth daily.  3  . JANUMET 50-500 MG tablet TAKE ONE TABLET BY MOUTH TWICE DAILY WITH A MEAL 180 tablet 0  . Lancets (ONETOUCH ULTRASOFT) lancets Use as instructed 100 each 12  . Lancets Misc. (ONE TOUCH SURESOFT) MISC Use to check blood sugars 1 each 3  . magnesium oxide (MAG-OX) 400 MG tablet Take 400 mg by mouth  2 (two) times daily.    . metoprolol succinate (TOPROL-XL) 50 MG 24 hr tablet Take 1 tablet (50 mg total) by mouth daily. Take with or immediately following a meal. 30 tablet 3  . MYRBETRIQ 50 MG TB24 tablet Take 50 mg by mouth daily.  3  . nitroGLYCERIN (NITROSTAT) 0.4 MG SL tablet Place 1 tablet (0.4 mg total) under the tongue every 5 (five) minutes x 3 doses as needed for chest pain. 25 tablet 12  . pentosan polysulfate (ELMIRON) 100 MG capsule Take 100 mg by mouth 3 (three) times daily before  meals.     . pravastatin (PRAVACHOL) 20 MG tablet TAKE ONE TABLET BY MOUTH ONCE DAILY 90 tablet 1  . SYNTHROID 75 MCG tablet TAKE ONE TABLET BY MOUTH ONCE DAILY 90 tablet 1  . triamcinolone ointment (KENALOG) 0.1 % APPLY THIN LAYER ON THE SKIN TWICE A DAY FOR 3 WEEKS  0   No current facility-administered medications for this visit.      REVIEW OF SYSTEMS:   Constitutional: Denies fevers, chills or night sweats Eyes: Denies blurriness of vision Ears, nose, mouth, throat, and face: Denies mucositis or sore throat Respiratory: Denies cough, dyspnea or wheezes Cardiovascular: Denies palpitation, chest discomfort or lower extremity swelling Gastrointestinal:  Denies nausea, heartburn or change in bowel habits Skin: Denies abnormal skin rashes Lymphatics: Denies new lymphadenopathy or easy bruising Neurological:Denies numbness, tingling or new weaknesses Behavioral/Psych: Mood is stable, no new changes  All other systems were reviewed with the patient and are negative.  PHYSICAL EXAMINATION: ECOG PERFORMANCE STATUS: 1 - Symptomatic but completely ambulatory  Vitals:   09/18/17 1139  BP: (!) 118/50  Pulse: 61  Resp: 18  Temp: (!) 97.5 F (36.4 C)  SpO2: 100%   Filed Weights   09/18/17 1139  Weight: 112 lb 11.2 oz (51.1 kg)    GENERAL:alert, no distress and comfortable.  She looks thin.  In no acute distress SKIN: skin color, texture, turgor are normal, no rashes or significant lesions EYES: normal, Conjunctiva are pink and non-injected, sclera clear OROPHARYNX:no exudate, no erythema and lips, buccal mucosa, and tongue normal  NECK: supple, thyroid normal size, non-tender, without nodularity LYMPH:  no palpable lymphadenopathy in the cervical, axillary or inguinal LUNGS: clear to auscultation and percussion with normal breathing effort HEART: regular rate & rhythm and no murmurs and no lower extremity edema ABDOMEN:abdomen soft, non-tender and normal bowel  sounds Musculoskeletal:no cyanosis of digits and no clubbing  NEURO: alert & oriented x 3 with fluent speech, no focal motor/sensory deficits  LABORATORY DATA:  I have reviewed the data as listed     Component Value Date/Time   NA 134 (L) 12/20/2016 1342   NA 138 12/12/2016 1150   K 4.6 12/20/2016 1342   K 4.6 12/12/2016 1150   CL 98 12/20/2016 1342   CL 103 07/27/2012 1259   CO2 30 12/20/2016 1342   CO2 31 (H) 12/12/2016 1150   GLUCOSE 103 (H) 12/20/2016 1342   GLUCOSE 149 (H) 12/12/2016 1150   GLUCOSE 118 (H) 07/27/2012 1259   BUN 29 (H) 12/20/2016 1342   BUN 23.4 12/12/2016 1150   CREATININE 1.58 (H) 12/20/2016 1342   CREATININE 1.7 (H) 12/12/2016 1150   CALCIUM 10.0 12/20/2016 1342   CALCIUM 11.4 (H) 12/12/2016 1150   PROT 6.5 12/12/2016 1150   ALBUMIN 3.9 12/12/2016 1150   AST 19 12/12/2016 1150   ALT 10 12/12/2016 1150   ALKPHOS 65 12/12/2016 1150   BILITOT  0.36 12/12/2016 1150   GFRNONAA 52 (L) 03/04/2016 0528   GFRAA >60 03/04/2016 0528    No results found for: SPEP, UPEP  Lab Results  Component Value Date   WBC 4.8 09/18/2017   NEUTROABS 3.2 09/18/2017   HGB 10.4 (L) 09/18/2017   HCT 31.7 (L) 09/18/2017   MCV 81.7 09/18/2017   PLT 321 09/18/2017      Chemistry      Component Value Date/Time   NA 134 (L) 12/20/2016 1342   NA 138 12/12/2016 1150   K 4.6 12/20/2016 1342   K 4.6 12/12/2016 1150   CL 98 12/20/2016 1342   CL 103 07/27/2012 1259   CO2 30 12/20/2016 1342   CO2 31 (H) 12/12/2016 1150   BUN 29 (H) 12/20/2016 1342   BUN 23.4 12/12/2016 1150   CREATININE 1.58 (H) 12/20/2016 1342   CREATININE 1.7 (H) 12/12/2016 1150      Component Value Date/Time   CALCIUM 10.0 12/20/2016 1342   CALCIUM 11.4 (H) 12/12/2016 1150   ALKPHOS 65 12/12/2016 1150   AST 19 12/12/2016 1150   ALT 10 12/12/2016 1150   BILITOT 0.36 12/12/2016 1150      I spent 15 minutes counseling the patient face to face. The total time spent in the appointment was 20  minutes and more than 50% was on counseling.   All questions were answered. The patient knows to call the clinic with any problems, questions or concerns. No barriers to learning was detected.    [name], MD 6/27/201911:52 AM

## 2017-09-18 NOTE — Telephone Encounter (Signed)
Gave patient avs and calendar of upcoming July through December appointments.

## 2017-09-18 NOTE — Patient Instructions (Signed)
Cyanocobalamin, Vitamin B12 injection What is this medicine? CYANOCOBALAMIN (sye an oh koe BAL a min) is a man made form of vitamin B12. Vitamin B12 is used in the growth of healthy blood cells, nerve cells, and proteins in the body. It also helps with the metabolism of fats and carbohydrates. This medicine is used to treat people who can not absorb vitamin B12. This medicine may be used for other purposes; ask your health care provider or pharmacist if you have questions. COMMON BRAND NAME(S): B-12 Compliance Kit, B-12 Injection Kit, Cyomin, LA-12, Nutri-Twelve, Physicians EZ Use B-12, Primabalt What should I tell my health care provider before I take this medicine? They need to know if you have any of these conditions: -kidney disease -Leber's disease -megaloblastic anemia -an unusual or allergic reaction to cyanocobalamin, cobalt, other medicines, foods, dyes, or preservatives -pregnant or trying to get pregnant -breast-feeding How should I use this medicine? This medicine is injected into a muscle or deeply under the skin. It is usually given by a health care professional in a clinic or doctor's office. However, your doctor may teach you how to inject yourself. Follow all instructions. Talk to your pediatrician regarding the use of this medicine in children. Special care may be needed. Overdosage: If you think you have taken too much of this medicine contact a poison control center or emergency room at once. NOTE: This medicine is only for you. Do not share this medicine with others. What if I miss a dose? If you are given your dose at a clinic or doctor's office, call to reschedule your appointment. If you give your own injections and you miss a dose, take it as soon as you can. If it is almost time for your next dose, take only that dose. Do not take double or extra doses. What may interact with this medicine? -colchicine -heavy alcohol intake This list may not describe all possible  interactions. Give your health care provider a list of all the medicines, herbs, non-prescription drugs, or dietary supplements you use. Also tell them if you smoke, drink alcohol, or use illegal drugs. Some items may interact with your medicine. What should I watch for while using this medicine? Visit your doctor or health care professional regularly. You may need blood work done while you are taking this medicine. You may need to follow a special diet. Talk to your doctor. Limit your alcohol intake and avoid smoking to get the best benefit. What side effects may I notice from receiving this medicine? Side effects that you should report to your doctor or health care professional as soon as possible: -allergic reactions like skin rash, itching or hives, swelling of the face, lips, or tongue -blue tint to skin -chest tightness, pain -difficulty breathing, wheezing -dizziness -red, swollen painful area on the leg Side effects that usually do not require medical attention (report to your doctor or health care professional if they continue or are bothersome): -diarrhea -headache This list may not describe all possible side effects. Call your doctor for medical advice about side effects. You may report side effects to FDA at 1-800-FDA-1088. Where should I keep my medicine? Keep out of the reach of children. Store at room temperature between 15 and 30 degrees C (59 and 85 degrees F). Protect from light. Throw away any unused medicine after the expiration date. NOTE: This sheet is a summary. It may not cover all possible information. If you have questions about this medicine, talk to your doctor, pharmacist, or   health care provider.  2018 Elsevier/Gold Standard (2007-06-22 22:10:20) Darbepoetin Alfa injection What is this medicine? DARBEPOETIN ALFA (dar be POE e tin AL fa) helps your body make more red blood cells. It is used to treat anemia caused by chronic kidney failure and chemotherapy. This  medicine may be used for other purposes; ask your health care provider or pharmacist if you have questions. COMMON BRAND NAME(S): Aranesp What should I tell my health care provider before I take this medicine? They need to know if you have any of these conditions: -blood clotting disorders or history of blood clots -cancer patient not on chemotherapy -cystic fibrosis -heart disease, such as angina, heart failure, or a history of a heart attack -hemoglobin level of 12 g/dL or greater -high blood pressure -low levels of folate, iron, or vitamin B12 -seizures -an unusual or allergic reaction to darbepoetin, erythropoietin, albumin, hamster proteins, latex, other medicines, foods, dyes, or preservatives -pregnant or trying to get pregnant -breast-feeding How should I use this medicine? This medicine is for injection into a vein or under the skin. It is usually given by a health care professional in a hospital or clinic setting. If you get this medicine at home, you will be taught how to prepare and give this medicine. Use exactly as directed. Take your medicine at regular intervals. Do not take your medicine more often than directed. It is important that you put your used needles and syringes in a special sharps container. Do not put them in a trash can. If you do not have a sharps container, call your pharmacist or healthcare provider to get one. A special MedGuide will be given to you by the pharmacist with each prescription and refill. Be sure to read this information carefully each time. Talk to your pediatrician regarding the use of this medicine in children. While this medicine may be used in children as young as 1 year for selected conditions, precautions do apply. Overdosage: If you think you have taken too much of this medicine contact a poison control center or emergency room at once. NOTE: This medicine is only for you. Do not share this medicine with others. What if I miss a dose? If  you miss a dose, take it as soon as you can. If it is almost time for your next dose, take only that dose. Do not take double or extra doses. What may interact with this medicine? Do not take this medicine with any of the following medications: -epoetin alfa This list may not describe all possible interactions. Give your health care provider a list of all the medicines, herbs, non-prescription drugs, or dietary supplements you use. Also tell them if you smoke, drink alcohol, or use illegal drugs. Some items may interact with your medicine. What should I watch for while using this medicine? Your condition will be monitored carefully while you are receiving this medicine. You may need blood work done while you are taking this medicine. What side effects may I notice from receiving this medicine? Side effects that you should report to your doctor or health care professional as soon as possible: -allergic reactions like skin rash, itching or hives, swelling of the face, lips, or tongue -breathing problems -changes in vision -chest pain -confusion, trouble speaking or understanding -feeling faint or lightheaded, falls -high blood pressure -muscle aches or pains -pain, swelling, warmth in the leg -rapid weight gain -severe headaches -sudden numbness or weakness of the face, arm or leg -trouble walking, dizziness, loss of balance   or coordination -seizures (convulsions) -swelling of the ankles, feet, hands -unusually weak or tired Side effects that usually do not require medical attention (report to your doctor or health care professional if they continue or are bothersome): -diarrhea -fever, chills (flu-like symptoms) -headaches -nausea, vomiting -redness, stinging, or swelling at site where injected This list may not describe all possible side effects. Call your doctor for medical advice about side effects. You may report side effects to FDA at 1-800-FDA-1088. Where should I keep my  medicine? Keep out of the reach of children. Store in a refrigerator between 2 and 8 degrees C (36 and 46 degrees F). Do not freeze. Do not shake. Throw away any unused portion if using a single-dose vial. Throw away any unused medicine after the expiration date. NOTE: This sheet is a summary. It may not cover all possible information. If you have questions about this medicine, talk to your doctor, pharmacist, or health care provider.  2018 Elsevier/Gold Standard (2015-10-30 19:52:26)

## 2017-09-19 LAB — ERYTHROPOIETIN: Erythropoietin: 11.7 m[IU]/mL (ref 2.6–18.5)

## 2017-09-22 ENCOUNTER — Telehealth: Payer: Self-pay

## 2017-09-22 NOTE — Telephone Encounter (Signed)
-----   Message from [name], MD sent at 09/22/2017 11:53 AM EDT ----- Regarding: labs pls let her know B12 and iron levels are good ----- Message ----- From: Interface, Lab In [facility] Sent: 09/18/2017  11:35 AM To: [name], MD

## 2017-09-22 NOTE — Telephone Encounter (Signed)
Called and given below message. Verbalized understanding. 

## 2017-09-26 DIAGNOSIS — N183 Chronic kidney disease, stage 3 (moderate): Secondary | ICD-10-CM | POA: Diagnosis not present

## 2017-09-30 DIAGNOSIS — N3010 Interstitial cystitis (chronic) without hematuria: Secondary | ICD-10-CM | POA: Diagnosis not present

## 2017-09-30 DIAGNOSIS — Z09 Encounter for follow-up examination after completed treatment for conditions other than malignant neoplasm: Secondary | ICD-10-CM | POA: Diagnosis not present

## 2017-10-02 DIAGNOSIS — E7800 Pure hypercholesterolemia, unspecified: Secondary | ICD-10-CM | POA: Diagnosis not present

## 2017-10-02 DIAGNOSIS — I10 Essential (primary) hypertension: Secondary | ICD-10-CM | POA: Diagnosis not present

## 2017-10-02 DIAGNOSIS — D631 Anemia in chronic kidney disease: Secondary | ICD-10-CM | POA: Diagnosis not present

## 2017-10-02 DIAGNOSIS — N183 Chronic kidney disease, stage 3 (moderate): Secondary | ICD-10-CM | POA: Diagnosis not present

## 2017-10-02 DIAGNOSIS — E039 Hypothyroidism, unspecified: Secondary | ICD-10-CM | POA: Diagnosis not present

## 2017-10-02 DIAGNOSIS — M8580 Other specified disorders of bone density and structure, unspecified site: Secondary | ICD-10-CM | POA: Diagnosis not present

## 2017-10-02 DIAGNOSIS — I2510 Atherosclerotic heart disease of native coronary artery without angina pectoris: Secondary | ICD-10-CM | POA: Diagnosis not present

## 2017-10-06 ENCOUNTER — Other Ambulatory Visit: Payer: Self-pay | Admitting: Endocrinology

## 2017-10-06 NOTE — Telephone Encounter (Signed)
Has not been seen since March 2019. Refill or Deny?

## 2017-10-06 NOTE — Telephone Encounter (Signed)
Approve only 25 test strips and note to make appointment with labs

## 2017-10-07 ENCOUNTER — Telehealth: Payer: Self-pay | Admitting: Endocrinology

## 2017-10-07 ENCOUNTER — Other Ambulatory Visit: Payer: Self-pay | Admitting: Endocrinology

## 2017-10-07 MED ORDER — GLUCOSE BLOOD VI STRP: ORAL_STRIP | 0 refills | Status: DC

## 2017-10-07 NOTE — Telephone Encounter (Signed)
Pt is asking if there has been a form received by the pharmacy for the patients test strips to be ordered-what is the status

## 2017-10-07 NOTE — Telephone Encounter (Signed)
Updated Rx has been sent per MD order.

## 2017-10-07 NOTE — Telephone Encounter (Signed)
You stated to only refill 25 strips and pt must then have an appt. Pharmacy stated that pt must have a 90 day supply in order to bill her insurance. Please advise.

## 2017-10-07 NOTE — Telephone Encounter (Signed)
She can have 100 test strips for the 90 days taking once a day alternating fasting and after meal

## 2017-10-20 DIAGNOSIS — I25111 Atherosclerotic heart disease of native coronary artery with angina pectoris with documented spasm: Secondary | ICD-10-CM | POA: Diagnosis not present

## 2017-10-20 DIAGNOSIS — E559 Vitamin D deficiency, unspecified: Secondary | ICD-10-CM | POA: Diagnosis not present

## 2017-10-20 DIAGNOSIS — N189 Chronic kidney disease, unspecified: Secondary | ICD-10-CM | POA: Diagnosis not present

## 2017-10-20 DIAGNOSIS — E119 Type 2 diabetes mellitus without complications: Secondary | ICD-10-CM | POA: Diagnosis not present

## 2017-10-20 DIAGNOSIS — I10 Essential (primary) hypertension: Secondary | ICD-10-CM | POA: Diagnosis not present

## 2017-10-20 DIAGNOSIS — D649 Anemia, unspecified: Secondary | ICD-10-CM | POA: Diagnosis not present

## 2017-10-20 DIAGNOSIS — I252 Old myocardial infarction: Secondary | ICD-10-CM | POA: Diagnosis not present

## 2017-10-20 DIAGNOSIS — E785 Hyperlipidemia, unspecified: Secondary | ICD-10-CM | POA: Diagnosis not present

## 2017-10-20 DIAGNOSIS — E039 Hypothyroidism, unspecified: Secondary | ICD-10-CM | POA: Diagnosis not present

## 2017-10-21 ENCOUNTER — Other Ambulatory Visit (INDEPENDENT_AMBULATORY_CARE_PROVIDER_SITE_OTHER): Payer: Medicare Other

## 2017-10-21 ENCOUNTER — Telehealth: Payer: Self-pay | Admitting: Hematology and Oncology

## 2017-10-21 DIAGNOSIS — E1149 Type 2 diabetes mellitus with other diabetic neurological complication: Secondary | ICD-10-CM

## 2017-10-21 LAB — COMPREHENSIVE METABOLIC PANEL
ALT: 10 U/L (ref 0–35)
AST: 18 U/L (ref 0–37)
Albumin: 4 g/dL (ref 3.5–5.2)
Alkaline Phosphatase: 54 U/L (ref 39–117)
BUN: 24 mg/dL — ABNORMAL HIGH (ref 6–23)
CO2: 34 mEq/L — ABNORMAL HIGH (ref 19–32)
Calcium: 10.2 mg/dL (ref 8.4–10.5)
Chloride: 99 mEq/L (ref 96–112)
Creatinine, Ser: 1.88 mg/dL — ABNORMAL HIGH (ref 0.40–1.20)
GFR: 27.76 mL/min — ABNORMAL LOW (ref >60.00)
Glucose, Bld: 107 mg/dL — ABNORMAL HIGH (ref 70–99)
Potassium: 4.7 mEq/L (ref 3.5–5.1)
Sodium: 136 mEq/L (ref 135–145)
Total Bilirubin: 0.3 mg/dL (ref 0.2–1.2)
Total Protein: 6.2 g/dL (ref 6.0–8.3)

## 2017-10-21 LAB — TSH: TSH: 1.64 u[IU]/mL (ref 0.35–4.50)

## 2017-10-21 LAB — HEMOGLOBIN A1C: Hgb A1c MFr Bld: 5.7 % (ref 4.6–6.5)

## 2017-10-21 LAB — MICROALBUMIN / CREATININE URINE RATIO
Creatinine,U: 102.8 mg/dL
Microalb Creat Ratio: 0.9 mg/g (ref 0.0–30.0)
Microalb, Ur: 1 mg/dL (ref 0.0–1.9)

## 2017-10-21 NOTE — Telephone Encounter (Signed)
Patient called to reschedule  °

## 2017-10-22 ENCOUNTER — Encounter: Payer: Self-pay | Admitting: *Deleted

## 2017-10-22 NOTE — Progress Notes (Signed)
[place] [employer] representative received phone call from patient requesting assistance with transportation for upcoming appointment.  CSW scheduled transportation through the [facility] transportation program for appointment on 10/27/17.  Patient and husband aware and know to contact CSW with questions or concerns.    Abigail Hoover, MSW, LCSW, OSW-C Clinical Social Worker [facility] [phone]

## 2017-10-23 ENCOUNTER — Other Ambulatory Visit: Payer: Medicare Other

## 2017-10-23 ENCOUNTER — Ambulatory Visit: Payer: Medicare Other

## 2017-10-23 DIAGNOSIS — N2581 Secondary hyperparathyroidism of renal origin: Secondary | ICD-10-CM | POA: Diagnosis not present

## 2017-10-23 DIAGNOSIS — N184 Chronic kidney disease, stage 4 (severe): Secondary | ICD-10-CM | POA: Diagnosis not present

## 2017-10-23 DIAGNOSIS — D631 Anemia in chronic kidney disease: Secondary | ICD-10-CM | POA: Diagnosis not present

## 2017-10-23 DIAGNOSIS — I129 Hypertensive chronic kidney disease with stage 1 through stage 4 chronic kidney disease, or unspecified chronic kidney disease: Secondary | ICD-10-CM | POA: Diagnosis not present

## 2017-10-24 ENCOUNTER — Encounter: Payer: Self-pay | Admitting: Endocrinology

## 2017-10-24 ENCOUNTER — Ambulatory Visit (INDEPENDENT_AMBULATORY_CARE_PROVIDER_SITE_OTHER): Payer: Medicare Other | Admitting: Endocrinology

## 2017-10-24 VITALS — BP 134/60 | HR 69 | Ht 60.0 in | Wt 112.2 lb

## 2017-10-24 DIAGNOSIS — E7800 Pure hypercholesterolemia, unspecified: Secondary | ICD-10-CM | POA: Diagnosis not present

## 2017-10-24 DIAGNOSIS — E1149 Type 2 diabetes mellitus with other diabetic neurological complication: Secondary | ICD-10-CM | POA: Diagnosis not present

## 2017-10-24 NOTE — Patient Instructions (Addendum)
Take janumet at lunch only  Gabapentin 2x daily only

## 2017-10-24 NOTE — Progress Notes (Signed)
Patient ID: Abigail Hoover, female   DOB: Sep 29, 1945, 72 y.o.   MRN: [id number]    Reason for Appointment:  follow-up of various issues and high calcium  History of Present Illness   Diagnosis: Type 2 DIABETES MELITUS, long-standing  She has had mild diabetes for several years but has required multiple drugs for control Usually has had postprandial hyperglycemia, this has been better controlled with adding Glyset to her main meals Has never been on insulin or a GLP-1 drug Actos had been stopped previously because of tendency to edema  Recent history:   Oral hypoglycemic drugs:  Precose 50 mg before breakfast and 2 dinner, Janumet 50/ 500  2x a day, Glucophage 2x a day   Her A1c is again lower than expected at 5.7, previously 6.1 or higher No recent fructosamine available  Current management, problems identified and blood sugar patterns:  She did  bring her One Touch monitor for download  However she is checking her blood sugar very sporadically and with some readings in the morning and 9 readings at bedtime  She had been taking Janumet once a day because of her renal dysfunction but she says she is taking it twice a day  Blood sugars are low normal fasting and usually fairly good after supper also  She thinks her portions are relatively small although her weight recently is stable  She does not do any formal exercise although is trying to be active during the day with household activities   Side effects from medications: None Proper timing of medications in relation to meals: Yes.          Monitors blood glucose: 1 time a day or less .    Glucometer: One Touch        Blood Glucose readings from download   Mean values apply above for all meters except median for One Touch  PRE-MEAL Fasting Lunch Dinner Bedtime Overall  Glucose range:  76-103      Mean/median:  85     103   POST-MEAL PC Breakfast PC Lunch PC Dinner  Glucose range:    98-180  Mean/median:     115    Meals: 2- 3 meals per day.  eating small breakfast and lunch but with only little protein, is vegetarian   Physical activity:  Mostly housework            Dietician visit: Most recent: None       Complications: are mild symptoms of neuropathy, no nephropathy or retinopathy   Wt Readings from Last 3 Encounters:  10/24/17 112 lb 3.2 oz (50.9 kg)  09/18/17 112 lb 11.2 oz (51.1 kg)  07/21/17 116 lb 4.8 oz (52.8 kg)   Lab Results  Component Value Date   HGBA1C 5.7 10/21/2017   HGBA1C 6.1 05/27/2017   HGBA1C 6.6 (H) 12/20/2016   Lab Results  Component Value Date   MICROALBUR 1.0 10/21/2017   LDLCALC 75 12/20/2016   CREATININE 1.88 (H) 10/21/2017      Lab Results  Component Value Date   FRUCTOSAMINE 266 07/26/2016   FRUCTOSAMINE 230 12/07/2014     OTHER problems Addressed today are in review of systems:    Allergies as of 10/24/2017      Reactions   Atorvastatin Rash   Ezetimibe Itching      Medication List        Accurate as of 10/24/17  3:21 PM. Always use your most recent med list.  acarbose 50 MG tablet Commonly known as:  PRECOSE TAKE 1 TABLET AT BREAKFAST, 1 TABLET AT LUNCH AND 1 & 1/2 TABLET AT BEDTIME   aspirin 81 MG EC tablet Take 1 tablet (81 mg total) by mouth 2 (two) times daily with a meal. For 6 weeks   DEXILANT 60 MG capsule Generic drug:  dexlansoprazole   furosemide 40 MG tablet Commonly known as:  LASIX Take 40 mg by mouth every morning.   gabapentin 300 MG capsule Commonly known as:  NEURONTIN Take 300 mg by mouth 4 (four) times daily.   glucose blood test strip Commonly known as:  ONETOUCH VERIO Use as instructed to check blood sugar once daily, alternating between fasting glucose checks and after meal checks.   hydrocortisone 2.5 % ointment APPLY TO AFFECED ARES(S) TOPICALLY TWICE DAILY AS NEEDED   ibandronate 150 MG tablet Commonly known as:  BONIVA   isosorbide mononitrate 60 MG 24 hr tablet Commonly known  as:  IMDUR Take 60 mg by mouth daily.   JANUMET 50-500 MG tablet Generic drug:  sitaGLIPtin-metformin TAKE ONE TABLET BY MOUTH TWICE DAILY WITH A MEAL   magnesium oxide 400 MG tablet Commonly known as:  MAG-OX Take 400 mg by mouth 2 (two) times daily.   metoprolol succinate 50 MG 24 hr tablet Commonly known as:  TOPROL-XL Take 1 tablet (50 mg total) by mouth daily. Take with or immediately following a meal.   MYRBETRIQ 50 MG Tb24 tablet Generic drug:  mirabegron ER Take 50 mg by mouth daily.   nitroGLYCERIN 0.4 MG SL tablet Commonly known as:  NITROSTAT Place 1 tablet (0.4 mg total) under the tongue every 5 (five) minutes x 3 doses as needed for chest pain.   ONE TOUCH SURESOFT Misc Use to check blood sugars   onetouch ultrasoft lancets Use as instructed   pentosan polysulfate 100 MG capsule Commonly known as:  ELMIRON Take 100 mg by mouth 3 (three) times daily before meals.   pravastatin 20 MG tablet Commonly known as:  PRAVACHOL TAKE ONE TABLET BY MOUTH ONCE DAILY   SYNTHROID 75 MCG tablet Generic drug:  levothyroxine TAKE ONE TABLET BY MOUTH ONCE DAILY   triamcinolone ointment 0.1 % Commonly known as:  KENALOG APPLY THIN LAYER ON THE SKIN TWICE A DAY FOR 3 WEEKS       Allergies:  Allergies  Allergen Reactions  . Atorvastatin Rash  . Ezetimibe Itching    Past Medical History:  Diagnosis Date  . 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 Anxiety   . Arthritis   . Bladder irritation   . Diabetes mellitus   . GERD (gastroesophageal reflux disease)   . Hypercholesterolemia   . Hypertension   . Hypothyroidism   . Iron deficiency anemia, unspecified   . Psoriasis   . Transfusion history 03/05/2016   for postoperative (ORIF) anemia superimposed on chronic anemia    Past Surgical History:  Procedure Laterality Date  . ABDOMINAL HYSTERECTOMY     TAH  . BREAST BIOPSY     Left breast biopsy benign lesion  . BREAST BIOPSY  03/11/2012   Procedure:  BREAST BIOPSY;  Surgeon: Odis Hollingshead, MD;  Location: [facility];  Service: General;  Laterality: Left;  remove left breast mass  . BREAST EXCISIONAL BIOPSY Left   . CARDIAC CATHETERIZATION N/A 01/02/2015   Procedure: Left Heart Cath and Coronary Angiography;  Surgeon: Charolette Forward, MD;  Location: [facility];  Service: Cardiovascular;  Laterality:  N/A;  . CATARACT EXTRACTION    . CERVICAL DISC SURGERY    . COLONOSCOPY  2013   neg. next 2023.   Marland Kitchen EYE SURGERY    . LEFT HIP SURGERY    . TOTAL HIP ARTHROPLASTY Left 03/02/2016   Procedure: TOTAL HIP ARTHROPLASTY ANTERIOR APPROACH;  Surgeon: Rod Can, MD;  Location: [facility];  Service: Orthopedics;  Laterality: Left;    Family History  Problem Relation Age of Onset  . Heart disease Mother        heart attack  . Stroke Father        brain hemorrhage  . Diabetes Brother   . Cancer Brother   . Breast cancer Sister   . Cancer Sister        breast cancer    Social History:  reports that she has never smoked. She has never used smokeless tobacco. She reports that she does not drink alcohol or use drugs.  Review of Systems:     HYPERTENSION: Mild, this is well-controlled with metoprolol 50 mg, previously losartan was stopped  Followed by cardiologist  RENAL dysfunction has been variable and followed by nephrologist Recent creatinine appears to be relatively higher  Also takes Lasix 40 mg for  edema  Lab Results  Component Value Date   CREATININE 1.88 (H) 10/21/2017   CREATININE 1.58 (H) 12/20/2016   CREATININE 1.7 (H) 12/12/2016     HYPERLIPIDEMIA: The lipid abnormality consists of elevated LDL which is mild and well controlled with pravastatin 20 mg LDL consistently below 100 She thinks she had a rash from Lipitor    Lab Results  Component Value Date   CHOL 156 12/20/2016   HDL 60.70 12/20/2016   LDLCALC 75 12/20/2016   TRIG 102.0 12/20/2016   CHOLHDL 3 12/20/2016    She has a long history of primary  HYPOTHYROIDISM   She takes SYNTHROID on an empty stomach in the morning  daily   TSH has been normal with taking 75 mcg daily    Lab Results  Component Value Date   TSH 1.64 10/21/2017   TSH 0.68 12/20/2016   TSH 1.63 07/26/2016   FREET4 1.35 12/20/2016   FREET4 1.07 04/18/2016   FREET4 1.37 07/13/2015    NEUROPATHY:  No symptoms of numbness In the feet  She thinks she is taking gabapentin because of bladder issues and recommended by a urologist a few years ago, taking this 3 times a day    Diabetic foot exam in 7/18: Normal monofilament sensation but absent pulses   OSTEOPENIA: Her T score Previously done by gynecologist was -2.1, currently on Boniva started in 01/2014  Last bone density was -2.6 in 2/18, this was ordered by her gastroenterologist    Examination:   BP 134/60 (BP Location: Left Arm, Patient Position: Sitting, Cuff Size: Normal)   Pulse 69   Ht 5' (1.524 m)   Wt 112 lb 3.2 oz (50.9 kg)   SpO2 97%   BMI 21.91 kg/m   Body mass index is 21.91 kg/m.    Has trace ankle edema present   ASSESSMENT/ PLAN:    Diabetes type 2, Nonobese   See history of present illness for detailed discussion of current diabetes management, blood sugar patterns and problems identified  Her A1c is relatively low at 5.7 However home blood sugars are also near normal with only occasional high readings after supper  Discussed that since her renal function is relatively worse and her creatinine clearance is only  27 she cannot take Janumet twice a day and she will only take 1 tablet of the 50/500 dosage She can take this at lunch Does need to add protein to her evening meal consistently She will continue acarbose unchanged but will have to consider reducing the dose because of renal dysfunction also  LIPIDS: Needs follow-up on the next visit  CKD: Her creatinine appears worse than last year She will continue follow-up with nephrologist Also has a precaution advised her  to reduce her gabapentin to twice a day instead of 3 times daily, she take this for bladder dysfunction reportedly   HYPOTHYROIDISM: TSH normal She will stay on 75 mcg Synthroid  There are no Patient Instructions on file for this visit.    Elayne Snare 10/24/2017, 3:21 PM

## 2017-10-27 ENCOUNTER — Inpatient Hospital Stay: Payer: Medicare Other | Attending: Hematology and Oncology

## 2017-10-27 ENCOUNTER — Inpatient Hospital Stay: Payer: Medicare Other

## 2017-10-27 VITALS — BP 135/58 | HR 57 | Temp 97.7°F | Resp 20

## 2017-10-27 DIAGNOSIS — D631 Anemia in chronic kidney disease: Secondary | ICD-10-CM

## 2017-10-27 DIAGNOSIS — D539 Nutritional anemia, unspecified: Secondary | ICD-10-CM

## 2017-10-27 DIAGNOSIS — E538 Deficiency of other specified B group vitamins: Secondary | ICD-10-CM | POA: Insufficient documentation

## 2017-10-27 DIAGNOSIS — N183 Chronic kidney disease, stage 3 unspecified: Secondary | ICD-10-CM

## 2017-10-27 DIAGNOSIS — N189 Chronic kidney disease, unspecified: Secondary | ICD-10-CM

## 2017-10-27 LAB — CBC WITH DIFFERENTIAL/PLATELET
Basophils Absolute: 0 10*3/uL (ref 0.0–0.1)
Basophils Relative: 0 %
Eosinophils Absolute: 0.4 10*3/uL (ref 0.0–0.5)
Eosinophils Relative: 7 %
HCT: 36.4 % (ref 34.8–46.6)
Hemoglobin: 11.4 g/dL — ABNORMAL LOW (ref 11.6–15.9)
Lymphocytes Relative: 30 %
Lymphs Abs: 1.6 10*3/uL (ref 0.9–3.3)
MCH: 26.7 pg (ref 25.1–34.0)
MCHC: 31.3 g/dL — ABNORMAL LOW (ref 31.5–36.0)
MCV: 85.2 fL (ref 79.5–101.0)
Monocytes Absolute: 0.3 10*3/uL (ref 0.1–0.9)
Monocytes Relative: 5 %
Neutro Abs: 3.1 10*3/uL (ref 1.5–6.5)
Neutrophils Relative %: 58 %
Platelets: 137 10*3/uL — ABNORMAL LOW (ref 145–400)
RBC: 4.27 MIL/uL (ref 3.70–5.45)
RDW: 15.9 % — ABNORMAL HIGH (ref 11.2–14.5)
WBC: 5.3 10*3/uL (ref 3.9–10.3)

## 2017-10-27 LAB — SEDIMENTATION RATE: Sed Rate: 2 mm/hr (ref 0–22)

## 2017-10-27 MED ORDER — CYANOCOBALAMIN 1000 MCG/ML IJ SOLN
1000.0000 ug | Freq: Once | INTRAMUSCULAR | Status: AC
  Administered 2017-10-27: 1000 ug via INTRAMUSCULAR

## 2017-10-27 MED ORDER — CYANOCOBALAMIN 1000 MCG/ML IJ SOLN
INTRAMUSCULAR | Status: AC
  Filled 2017-10-27: qty 1

## 2017-10-27 NOTE — Progress Notes (Signed)
Arenesp  Held today for hemoglobin of 11.4.

## 2017-10-27 NOTE — Patient Instructions (Signed)
Cyanocobalamin, Vitamin B12 injection What is this medicine? CYANOCOBALAMIN (sye an oh koe BAL a min) is a man made form of vitamin B12. Vitamin B12 is used in the growth of healthy blood cells, nerve cells, and proteins in the body. It also helps with the metabolism of fats and carbohydrates. This medicine is used to treat people who can not absorb vitamin B12. This medicine may be used for other purposes; ask your health care provider or pharmacist if you have questions. COMMON BRAND NAME(S): B-12 Compliance Kit, B-12 Injection Kit, Cyomin, LA-12, Nutri-Twelve, Physicians EZ Use B-12, Primabalt What should I tell my health care provider before I take this medicine? They need to know if you have any of these conditions: -kidney disease -Leber's disease -megaloblastic anemia -an unusual or allergic reaction to cyanocobalamin, cobalt, other medicines, foods, dyes, or preservatives -pregnant or trying to get pregnant -breast-feeding How should I use this medicine? This medicine is injected into a muscle or deeply under the skin. It is usually given by a health care professional in a clinic or doctor's office. However, your doctor may teach you how to inject yourself. Follow all instructions. Talk to your pediatrician regarding the use of this medicine in children. Special care may be needed. Overdosage: If you think you have taken too much of this medicine contact a poison control center or emergency room at once. NOTE: This medicine is only for you. Do not share this medicine with others. What if I miss a dose? If you are given your dose at a clinic or doctor's office, call to reschedule your appointment. If you give your own injections and you miss a dose, take it as soon as you can. If it is almost time for your next dose, take only that dose. Do not take double or extra doses. What may interact with this medicine? -colchicine -heavy alcohol intake This list may not describe all possible  interactions. Give your health care provider a list of all the medicines, herbs, non-prescription drugs, or dietary supplements you use. Also tell them if you smoke, drink alcohol, or use illegal drugs. Some items may interact with your medicine. What should I watch for while using this medicine? Visit your doctor or health care professional regularly. You may need blood work done while you are taking this medicine. You may need to follow a special diet. Talk to your doctor. Limit your alcohol intake and avoid smoking to get the best benefit. What side effects may I notice from receiving this medicine? Side effects that you should report to your doctor or health care professional as soon as possible: -allergic reactions like skin rash, itching or hives, swelling of the face, lips, or tongue -blue tint to skin -chest tightness, pain -difficulty breathing, wheezing -dizziness -red, swollen painful area on the leg Side effects that usually do not require medical attention (report to your doctor or health care professional if they continue or are bothersome): -diarrhea -headache This list may not describe all possible side effects. Call your doctor for medical advice about side effects. You may report side effects to FDA at 1-800-FDA-1088. Where should I keep my medicine? Keep out of the reach of children. Store at room temperature between 15 and 30 degrees C (59 and 85 degrees F). Protect from light. Throw away any unused medicine after the expiration date. NOTE: This sheet is a summary. It may not cover all possible information. If you have questions about this medicine, talk to your doctor, pharmacist, or   health care provider.  2018 Elsevier/Gold Standard (2007-06-22 22:10:20)  

## 2017-11-04 ENCOUNTER — Telehealth: Payer: Self-pay | Admitting: Neurology

## 2017-11-04 NOTE — Telephone Encounter (Signed)
Agree no need for f/u unless new neuro issues

## 2017-11-04 NOTE — Telephone Encounter (Signed)
Per Dr Leonie Man she is to follow up as needed. PT does not have any neurological deficits per his last note.

## 2017-11-04 NOTE — Telephone Encounter (Signed)
Patient came in with her husband Abigail Hoover today and stated she would like to know when her next follow-up is. She states she comes back to [place] every year. I didn't see any notes in her after visit summary from her last visit stating she needed a f/u.

## 2017-11-05 NOTE — Telephone Encounter (Signed)
Rn call patient about that per Dr. Leonie Man note she does not need to follow up. RN stated this is per his last note. RN stated she can follow if she has a new neurological issue occurring. When nurse stated this pt began to state" Dr.Sethi saw my husband yesterday, and recommend a medication for over the counter prevagen for him". She began to say I forget things too". Rn stated she would need to seek her PCP for an evaluation for her memory first. IF the PCP feels from their assessment its her memory they would need to send over a referral because its a new issue. PT verbalized understanding.

## 2017-11-20 ENCOUNTER — Other Ambulatory Visit: Payer: Self-pay | Admitting: General Surgery

## 2017-11-20 DIAGNOSIS — Z1231 Encounter for screening mammogram for malignant neoplasm of breast: Secondary | ICD-10-CM

## 2017-11-25 ENCOUNTER — Other Ambulatory Visit: Payer: Medicare Other

## 2017-11-27 ENCOUNTER — Inpatient Hospital Stay: Payer: Medicare Other | Attending: Hematology and Oncology

## 2017-11-27 ENCOUNTER — Inpatient Hospital Stay: Payer: Medicare Other

## 2017-11-27 VITALS — BP 154/61 | HR 60 | Temp 98.1°F | Resp 18

## 2017-11-27 DIAGNOSIS — N183 Chronic kidney disease, stage 3 (moderate): Secondary | ICD-10-CM

## 2017-11-27 DIAGNOSIS — E538 Deficiency of other specified B group vitamins: Secondary | ICD-10-CM | POA: Diagnosis not present

## 2017-11-27 DIAGNOSIS — D631 Anemia in chronic kidney disease: Secondary | ICD-10-CM | POA: Diagnosis not present

## 2017-11-27 DIAGNOSIS — N189 Chronic kidney disease, unspecified: Secondary | ICD-10-CM | POA: Insufficient documentation

## 2017-11-27 LAB — CBC WITH DIFFERENTIAL/PLATELET
Basophils Absolute: 0 10*3/uL (ref 0.0–0.1)
Basophils Relative: 1 %
Eosinophils Absolute: 0.5 10*3/uL (ref 0.0–0.5)
Eosinophils Relative: 10 %
HCT: 34.1 % — ABNORMAL LOW (ref 34.8–46.6)
Hemoglobin: 10.8 g/dL — ABNORMAL LOW (ref 11.6–15.9)
Lymphocytes Relative: 31 %
Lymphs Abs: 1.5 10*3/uL (ref 0.9–3.3)
MCH: 26.6 pg (ref 25.1–34.0)
MCHC: 31.7 g/dL (ref 31.5–36.0)
MCV: 83.8 fL (ref 79.5–101.0)
Monocytes Absolute: 0.3 10*3/uL (ref 0.1–0.9)
Monocytes Relative: 7 %
Neutro Abs: 2.5 10*3/uL (ref 1.5–6.5)
Neutrophils Relative %: 51 %
Platelets: 141 10*3/uL — ABNORMAL LOW (ref 145–400)
RBC: 4.07 MIL/uL (ref 3.70–5.45)
RDW: 15.6 % — ABNORMAL HIGH (ref 11.2–14.5)
WBC: 4.8 10*3/uL (ref 3.9–10.3)

## 2017-11-27 MED ORDER — DARBEPOETIN ALFA 200 MCG/0.4ML IJ SOSY
PREFILLED_SYRINGE | INTRAMUSCULAR | Status: AC
  Filled 2017-11-27: qty 0.4

## 2017-11-27 MED ORDER — DARBEPOETIN ALFA 200 MCG/0.4ML IJ SOSY
200.0000 ug | PREFILLED_SYRINGE | Freq: Once | INTRAMUSCULAR | Status: AC
  Administered 2017-11-27: 200 ug via SUBCUTANEOUS

## 2017-11-27 MED ORDER — CYANOCOBALAMIN 1000 MCG/ML IJ SOLN
1000.0000 ug | Freq: Once | INTRAMUSCULAR | Status: AC
  Administered 2017-11-27: 1000 ug via INTRAMUSCULAR

## 2017-11-27 MED ORDER — CYANOCOBALAMIN 1000 MCG/ML IJ SOLN
INTRAMUSCULAR | Status: AC
  Filled 2017-11-27: qty 1

## 2017-11-28 ENCOUNTER — Ambulatory Visit: Payer: Medicare Other | Admitting: Endocrinology

## 2017-12-04 ENCOUNTER — Other Ambulatory Visit: Payer: Self-pay | Admitting: Endocrinology

## 2017-12-05 DIAGNOSIS — L308 Other specified dermatitis: Secondary | ICD-10-CM | POA: Diagnosis not present

## 2017-12-06 ENCOUNTER — Other Ambulatory Visit: Payer: Self-pay | Admitting: Endocrinology

## 2017-12-15 ENCOUNTER — Ambulatory Visit
Admission: RE | Admit: 2017-12-15 | Discharge: 2017-12-15 | Disposition: A | Discharge status: Home / Self Care | Payer: Medicare Other | Source: Ambulatory Visit | Attending: General Surgery | Admitting: General Surgery

## 2017-12-15 ENCOUNTER — Ambulatory Visit: Payer: Medicare Other

## 2017-12-15 DIAGNOSIS — Z1231 Encounter for screening mammogram for malignant neoplasm of breast: Secondary | ICD-10-CM | POA: Diagnosis not present

## 2018-01-01 ENCOUNTER — Inpatient Hospital Stay: Payer: Medicare Other | Attending: Hematology and Oncology

## 2018-01-01 ENCOUNTER — Inpatient Hospital Stay: Payer: Medicare Other

## 2018-01-01 ENCOUNTER — Telehealth: Payer: Self-pay | Admitting: Endocrinology

## 2018-01-01 VITALS — BP 140/80 | HR 60 | Temp 97.7°F | Resp 18

## 2018-01-01 DIAGNOSIS — E538 Deficiency of other specified B group vitamins: Secondary | ICD-10-CM

## 2018-01-01 DIAGNOSIS — N189 Chronic kidney disease, unspecified: Secondary | ICD-10-CM | POA: Diagnosis not present

## 2018-01-01 DIAGNOSIS — D539 Nutritional anemia, unspecified: Secondary | ICD-10-CM

## 2018-01-01 DIAGNOSIS — N183 Chronic kidney disease, stage 3 unspecified: Secondary | ICD-10-CM

## 2018-01-01 DIAGNOSIS — D631 Anemia in chronic kidney disease: Secondary | ICD-10-CM

## 2018-01-01 LAB — CBC WITH DIFFERENTIAL (CANCER CENTER ONLY)
Abs Immature Granulocytes: 0 10*3/uL (ref 0.00–0.07)
Basophils Absolute: 0 10*3/uL (ref 0.0–0.1)
Basophils Relative: 0 %
Eosinophils Absolute: 0.6 10*3/uL — ABNORMAL HIGH (ref 0.0–0.5)
Eosinophils Relative: 11 %
HCT: 39.6 % (ref 36.0–46.0)
Hemoglobin: 12.1 g/dL (ref 12.0–15.0)
Immature Granulocytes: 0 %
Lymphocytes Relative: 32 %
Lymphs Abs: 1.6 10*3/uL (ref 0.7–4.0)
MCH: 26.1 pg (ref 26.0–34.0)
MCHC: 30.6 g/dL (ref 30.0–36.0)
MCV: 85.5 fL (ref 80.0–100.0)
Monocytes Absolute: 0.3 10*3/uL (ref 0.1–1.0)
Monocytes Relative: 5 %
Neutro Abs: 2.6 10*3/uL (ref 1.7–7.7)
Neutrophils Relative %: 52 %
Platelet Count: 185 10*3/uL (ref 150–400)
RBC: 4.63 MIL/uL (ref 3.87–5.11)
RDW: 15.8 % — ABNORMAL HIGH (ref 11.5–15.5)
WBC Count: 5 10*3/uL (ref 4.0–10.5)
nRBC: 0 % (ref 0.0–0.2)

## 2018-01-01 LAB — IRON AND TIBC
Iron: 85 ug/dL (ref 41–142)
Saturation Ratios: 34 % (ref 21–57)
TIBC: 250 ug/dL (ref 236–444)
UIBC: 165 ug/dL

## 2018-01-01 LAB — FERRITIN: Ferritin: 182 ng/mL (ref 11–307)

## 2018-01-01 LAB — VITAMIN B12: Vitamin B-12: 558 pg/mL (ref 180–914)

## 2018-01-01 MED ORDER — DARBEPOETIN ALFA 200 MCG/0.4ML IJ SOSY
PREFILLED_SYRINGE | INTRAMUSCULAR | Status: AC
  Filled 2018-01-01: qty 0.4

## 2018-01-01 MED ORDER — CYANOCOBALAMIN 1000 MCG/ML IJ SOLN
INTRAMUSCULAR | Status: AC
  Filled 2018-01-01: qty 1

## 2018-01-01 MED ORDER — CYANOCOBALAMIN 1000 MCG/ML IJ SOLN
1000.0000 ug | Freq: Once | INTRAMUSCULAR | Status: AC
  Administered 2018-01-01: 1000 ug via INTRAMUSCULAR

## 2018-01-01 NOTE — Progress Notes (Signed)
Hemoglobin noted at 12.1 on labs today.  No aranesp injection needed. B12 shout given in L  Arm   As ordered.

## 2018-01-01 NOTE — Patient Instructions (Signed)
Cyanocobalamin, Vitamin B12 injection What is this medicine? CYANOCOBALAMIN (sye an oh koe BAL a min) is a man made form of vitamin B12. Vitamin B12 is used in the growth of healthy blood cells, nerve cells, and proteins in the body. It also helps with the metabolism of fats and carbohydrates. This medicine is used to treat people who can not absorb vitamin B12. This medicine may be used for other purposes; ask your health care provider or pharmacist if you have questions. COMMON BRAND NAME(S): B-12 Compliance Kit, B-12 Injection Kit, Cyomin, LA-12, Nutri-Twelve, Physicians EZ Use B-12, Primabalt What should I tell my health care provider before I take this medicine? They need to know if you have any of these conditions: -kidney disease -Leber's disease -megaloblastic anemia -an unusual or allergic reaction to cyanocobalamin, cobalt, other medicines, foods, dyes, or preservatives -pregnant or trying to get pregnant -breast-feeding How should I use this medicine? This medicine is injected into a muscle or deeply under the skin. It is usually given by a health care professional in a clinic or doctor's office. However, your doctor may teach you how to inject yourself. Follow all instructions. Talk to your pediatrician regarding the use of this medicine in children. Special care may be needed. Overdosage: If you think you have taken too much of this medicine contact a poison control center or emergency room at once. NOTE: This medicine is only for you. Do not share this medicine with others. What if I miss a dose? If you are given your dose at a clinic or doctor's office, call to reschedule your appointment. If you give your own injections and you miss a dose, take it as soon as you can. If it is almost time for your next dose, take only that dose. Do not take double or extra doses. What may interact with this medicine? -colchicine -heavy alcohol intake This list may not describe all possible  interactions. Give your health care provider a list of all the medicines, herbs, non-prescription drugs, or dietary supplements you use. Also tell them if you smoke, drink alcohol, or use illegal drugs. Some items may interact with your medicine. What should I watch for while using this medicine? Visit your doctor or health care professional regularly. You may need blood work done while you are taking this medicine. You may need to follow a special diet. Talk to your doctor. Limit your alcohol intake and avoid smoking to get the best benefit. What side effects may I notice from receiving this medicine? Side effects that you should report to your doctor or health care professional as soon as possible: -allergic reactions like skin rash, itching or hives, swelling of the face, lips, or tongue -blue tint to skin -chest tightness, pain -difficulty breathing, wheezing -dizziness -red, swollen painful area on the leg Side effects that usually do not require medical attention (report to your doctor or health care professional if they continue or are bothersome): -diarrhea -headache This list may not describe all possible side effects. Call your doctor for medical advice about side effects. You may report side effects to FDA at 1-800-FDA-1088. Where should I keep my medicine? Keep out of the reach of children. Store at room temperature between 15 and 30 degrees C (59 and 85 degrees F). Protect from light. Throw away any unused medicine after the expiration date. NOTE: This sheet is a summary. It may not cover all possible information. If you have questions about this medicine, talk to your doctor, pharmacist, or   health care provider.  2018 Elsevier/Gold Standard (2007-06-22 22:10:20)  

## 2018-01-01 NOTE — Telephone Encounter (Signed)
Patient is stating that she wants to change her medication. Please call patient at ph# [phone] to discuss.

## 2018-01-02 ENCOUNTER — Other Ambulatory Visit: Payer: Self-pay | Admitting: Endocrinology

## 2018-01-02 MED ORDER — ONETOUCH ULTRASOFT LANCETS MISC: 8 refills | Status: DC

## 2018-01-02 NOTE — Telephone Encounter (Signed)
Please resend Rx to [facility] on [address] [place]. She does not use Psychologist, clinical.

## 2018-01-02 NOTE — Telephone Encounter (Signed)
This has been resolved rx sent

## 2018-01-02 NOTE — Telephone Encounter (Signed)
°*  STAT* If patient is at the pharmacy, call can be transferred to refill team.   1. Which medications need to be refilled? (please list name of each medication and dose if known) test strips  2. Which pharmacy/location (including street and city if local pharmacy) is medication to be sent to?[facility]   3. Do they need a 30 day or 90 day supply? 90 day    Pt is also wanting a call back from previous message. Please call pt.

## 2018-01-03 DIAGNOSIS — Z23 Encounter for immunization: Secondary | ICD-10-CM | POA: Diagnosis not present

## 2018-01-05 ENCOUNTER — Telehealth: Payer: Self-pay | Admitting: Endocrinology

## 2018-01-05 NOTE — Telephone Encounter (Signed)
Patient is requesting a new medication  due to her blood sugar is running high (182-200). Please call patient at ph# [phone] to advise

## 2018-01-06 NOTE — Telephone Encounter (Signed)
Readings from meter on MD desk

## 2018-01-06 NOTE — Telephone Encounter (Signed)
Please advise 

## 2018-01-06 NOTE — Telephone Encounter (Signed)
She will need to make an appointment for rechecking labs with her A1c and office visit.  Need to know her blood sugar readings at different times for the last 3 to 4 days

## 2018-01-06 NOTE — Telephone Encounter (Signed)
Patients husband is going to bring in meter to be downloaded and patient will call back to schedule appointments

## 2018-01-07 NOTE — Telephone Encounter (Signed)
Blood sugars reviewed from meter download, averaging 175 after dinner.  Please let her know that we cannot increase her medications because of her poor renal function and may need to add insulin at that time.  Needs to be seen in the office to start this

## 2018-01-08 ENCOUNTER — Other Ambulatory Visit (INDEPENDENT_AMBULATORY_CARE_PROVIDER_SITE_OTHER): Payer: Medicare Other

## 2018-01-08 ENCOUNTER — Other Ambulatory Visit: Payer: Self-pay | Admitting: Endocrinology

## 2018-01-08 DIAGNOSIS — E1165 Type 2 diabetes mellitus with hyperglycemia: Secondary | ICD-10-CM | POA: Diagnosis not present

## 2018-01-08 DIAGNOSIS — E7800 Pure hypercholesterolemia, unspecified: Secondary | ICD-10-CM

## 2018-01-08 LAB — LIPID PANEL
Cholesterol: 174 mg/dL (ref 0–200)
HDL: 58.4 mg/dL (ref >39.00)
LDL Cholesterol: 96 mg/dL (ref 0–99)
NonHDL: 115.22
Total CHOL/HDL Ratio: 3
Triglycerides: 97 mg/dL (ref 0.0–149.0)
VLDL: 19.4 mg/dL (ref 0.0–40.0)

## 2018-01-08 LAB — BASIC METABOLIC PANEL
BUN: 32 mg/dL — ABNORMAL HIGH (ref 6–23)
CO2: 31 mEq/L (ref 19–32)
Calcium: 10.1 mg/dL (ref 8.4–10.5)
Chloride: 99 mEq/L (ref 96–112)
Creatinine, Ser: 1.87 mg/dL — ABNORMAL HIGH (ref 0.40–1.20)
GFR: 27.92 mL/min — ABNORMAL LOW (ref >60.00)
Glucose, Bld: 122 mg/dL — ABNORMAL HIGH (ref 70–99)
Potassium: 4.9 mEq/L (ref 3.5–5.1)
Sodium: 138 mEq/L (ref 135–145)

## 2018-01-08 NOTE — Telephone Encounter (Signed)
Patient is being seen tomorrow

## 2018-01-09 ENCOUNTER — Encounter: Payer: Self-pay | Admitting: Endocrinology

## 2018-01-09 ENCOUNTER — Telehealth: Payer: Self-pay | Admitting: Endocrinology

## 2018-01-09 ENCOUNTER — Ambulatory Visit (INDEPENDENT_AMBULATORY_CARE_PROVIDER_SITE_OTHER): Payer: Medicare Other | Admitting: Endocrinology

## 2018-01-09 VITALS — BP 132/64 | HR 55 | Ht 60.0 in | Wt 113.0 lb

## 2018-01-09 DIAGNOSIS — E039 Hypothyroidism, unspecified: Secondary | ICD-10-CM | POA: Diagnosis not present

## 2018-01-09 DIAGNOSIS — E1165 Type 2 diabetes mellitus with hyperglycemia: Secondary | ICD-10-CM | POA: Diagnosis not present

## 2018-01-09 LAB — FRUCTOSAMINE: Fructosamine: 269 umol/L (ref 0–285)

## 2018-01-09 MED ORDER — REPAGLINIDE 0.5 MG PO TABS: 0.5000 mg | ORAL_TABLET | Freq: Every day | ORAL | 2 refills | Status: DC

## 2018-01-09 MED ORDER — SITAGLIPTIN PHOS-METFORMIN HCL 50-500 MG PO TABS: 1.0000 | ORAL_TABLET | Freq: Every day | ORAL | 0 refills | Status: DC

## 2018-01-09 NOTE — Telephone Encounter (Signed)
Called pt back about medication questions and pt understand Dr. Dwyane Dee instructions  Take 1 tablet right before breakfast and 2 tablets right before dinner  Repaglinide is a new medication needs to be taken just before eating dinner.  If blood sugar starts getting below 70 may need to stop this Change Janumet to the morning

## 2018-01-09 NOTE — Telephone Encounter (Signed)
Patient is requesting a call back to discuss her medication. She is confused on how she should be taking this  Please advise   sitaGLIPtin-metformin (JANUMET) 50-500 MG tablet

## 2018-01-09 NOTE — Progress Notes (Signed)
Patient ID: Abigail Hoover, female   DOB: 08-Dec-1945, 72 y.o.   MRN: [id number]    Reason for Appointment:  follow-up of various issues and high calcium  History of Present Illness   Diagnosis: Type 2 DIABETES MELITUS, long-standing  She has had mild diabetes for several years but has required multiple drugs for control Usually has had postprandial hyperglycemia, this has been better controlled with adding Glyset to her main meals Has never been on insulin or a GLP-1 drug Actos had been stopped previously because of tendency to edema  Recent history:   Oral hypoglycemic drugs:  Precose 100 mg before breakfast and 50 mg dinner, Janumet 50/ 500 at dinner a day,  Her A1c is usually lower than expected, last was at 5.7, previously 6.1 or higher Recent fructosamine 269  Current management, problems identified and blood sugar patterns:  She had called because she was concerned about her blood sugar being high after dinner  He will know she is supposed to be taking 100 mg of Precose at dinnertime she is taking the higher dose in the morning and less in the evening  Also blood sugars are not consistent with readings as low as 130 after dinner  She is not always getting enough protein and restricting carbohydrate at dinnertime and will have a small amount of rice also  Weight is about the same  Blood sugars are below 100 and the morning and has only 1 high reading in the early afternoon which is 170  Lab glucose was 133 after breakfast; she thinks she is eating a good meal at breakfast but usually very little at lunch  She is again not doing any formal exercise  Her average blood sugar is 160 overall, previously was 103 with fewer blood sugars   Side effects from medications: None Proper timing of medications in relation to meals: Yes.          Monitors blood glucose: 1 time a day or less .    Glucometer: One Touch        Blood Glucose readings from download     PRE-MEAL Fasting Lunch Dinner Bedtime Overall  Glucose range:  92-98      Mean/median:     170   POST-MEAL PC Breakfast PC Lunch PC Dinner  Glucose range:   106, 170  130-205  Mean/median:    175   Previous readings  Mean values apply above for all meters except median for One Touch  PRE-MEAL Fasting Lunch Dinner Bedtime Overall  Glucose range:  76-103      Mean/median:  85     103   POST-MEAL PC Breakfast PC Lunch PC Dinner  Glucose range:    98-180  Mean/median:    115    Meals: 2- 3 meals per day.  eating small breakfast and lunch but with only little protein, is vegetarian   Physical activity:  Mostly housework            Dietician visit: Most recent: None       Complications: are mild symptoms of neuropathy, no nephropathy or retinopathy   Wt Readings from Last 3 Encounters:  01/09/18 113 lb (51.3 kg)  10/24/17 112 lb 3.2 oz (50.9 kg)  09/18/17 112 lb 11.2 oz (51.1 kg)   Lab Results  Component Value Date   HGBA1C 5.7 10/21/2017   HGBA1C 6.1 05/27/2017   HGBA1C 6.6 (H) 12/20/2016   Lab Results  Component Value Date   MICROALBUR  1.0 10/21/2017   LDLCALC 96 01/08/2018   CREATININE 1.87 (H) 01/08/2018      Lab Results  Component Value Date   FRUCTOSAMINE 269 01/08/2018   FRUCTOSAMINE 266 07/26/2016   FRUCTOSAMINE 230 12/07/2014     OTHER problems Addressed today are in review of systems:    Allergies as of 01/09/2018      Reactions   Atorvastatin Rash   Ezetimibe Itching      Medication List        Accurate as of 01/09/18  8:39 AM. Always use your most recent med list.          acarbose 50 MG tablet Commonly known as:  PRECOSE TAKE 1 TABLET AT BREAKFAST, 1 TABLET AT LUNCH AND 1 & 1/2 TABLET AT BEDTIME   aspirin 81 MG EC tablet Take 1 tablet (81 mg total) by mouth 2 (two) times daily with a meal. For 6 weeks   DEXILANT 60 MG capsule Generic drug:  dexlansoprazole   furosemide 40 MG tablet Commonly known as:  LASIX Take 40 mg  by mouth every morning.   gabapentin 300 MG capsule Commonly known as:  NEURONTIN Take 300 mg by mouth 4 (four) times daily.   hydrocortisone 2.5 % ointment APPLY TO AFFECED ARES(S) TOPICALLY TWICE DAILY AS NEEDED   ibandronate 150 MG tablet Commonly known as:  BONIVA   isosorbide mononitrate 60 MG 24 hr tablet Commonly known as:  IMDUR Take 60 mg by mouth daily.   JANUMET 50-500 MG tablet Generic drug:  sitaGLIPtin-metformin TAKE ONE TABLET BY MOUTH TWICE DAILY WITH A MEAL   magnesium oxide 400 MG tablet Commonly known as:  MAG-OX Take 400 mg by mouth 2 (two) times daily.   metoprolol succinate 50 MG 24 hr tablet Commonly known as:  TOPROL-XL Take 1 tablet (50 mg total) by mouth daily. Take with or immediately following a meal.   MYRBETRIQ 50 MG Tb24 tablet Generic drug:  mirabegron ER Take 50 mg by mouth daily.   nitroGLYCERIN 0.4 MG SL tablet Commonly known as:  NITROSTAT Place 1 tablet (0.4 mg total) under the tongue every 5 (five) minutes x 3 doses as needed for chest pain.   ONE TOUCH SURESOFT Misc Use to check blood sugars   onetouch ultrasoft lancets Use to test blood sugar once daily   ONETOUCH VERIO test strip Generic drug:  glucose blood USE AS INSTRUCTED TO CHECK BLOOD SUGAR ONCE A DAY ALTERNATING BETWEEN FASTING GLUCOSE CLECKS AND AFTER MEAL CHECKS.   pentosan polysulfate 100 MG capsule Commonly known as:  ELMIRON Take 100 mg by mouth 3 (three) times daily before meals.   pravastatin 20 MG tablet Commonly known as:  PRAVACHOL TAKE ONE TABLET BY MOUTH ONCE DAILY   SYNTHROID 75 MCG tablet Generic drug:  levothyroxine TAKE ONE TABLET BY MOUTH ONCE DAILY   triamcinolone ointment 0.1 % Commonly known as:  KENALOG APPLY THIN LAYER ON THE SKIN TWICE A DAY FOR 3 WEEKS       Allergies:  Allergies  Allergen Reactions  . Atorvastatin Rash  . Ezetimibe Itching    Past Medical History:  Diagnosis Date  . Anemia in chronic kidney disease  05/01/2015  . Anxiety   . Arthritis   . Bladder irritation   . Diabetes mellitus   . GERD (gastroesophageal reflux disease)   . Hypercholesterolemia   . Hypertension   . Hypothyroidism   . Iron deficiency anemia, unspecified   . Psoriasis   . Transfusion  history 03/05/2016   for postoperative (ORIF) anemia superimposed on chronic anemia    Past Surgical History:  Procedure Laterality Date  . ABDOMINAL HYSTERECTOMY     TAH  . BREAST BIOPSY     Left breast biopsy benign lesion  . BREAST BIOPSY  03/11/2012   Procedure: BREAST BIOPSY;  Surgeon: Odis Hollingshead, MD;  Location: [facility];  Service: General;  Laterality: Left;  remove left breast mass  . BREAST EXCISIONAL BIOPSY Left   . CARDIAC CATHETERIZATION N/A 01/02/2015   Procedure: Left Heart Cath and Coronary Angiography;  Surgeon: Charolette Forward, MD;  Location: [facility];  Service: Cardiovascular;  Laterality: N/A;  . CATARACT EXTRACTION    . CERVICAL DISC SURGERY    . COLONOSCOPY  2013   neg. next 2023.   Marland Kitchen EYE SURGERY    . LEFT HIP SURGERY    . TOTAL HIP ARTHROPLASTY Left 03/02/2016   Procedure: TOTAL HIP ARTHROPLASTY ANTERIOR APPROACH;  Surgeon: Rod Can, MD;  Location: [facility];  Service: Orthopedics;  Laterality: Left;    Family History  Problem Relation Age of Onset  . Heart disease Mother        heart attack  . Stroke Father        brain hemorrhage  . Diabetes Brother   . Cancer Brother   . Breast cancer Sister   . Cancer Sister        breast cancer    Social History:  reports that she has never smoked. She has never used smokeless tobacco. She reports that she does not drink alcohol or use drugs.  Review of Systems:     HYPERTENSION: Mild, this is well-controlled with metoprolol 50 mg only Followed by cardiologist  RENAL dysfunction has been variable and followed by nephrologist No hyperkalemia recently  Also takes Lasix 40 mg for  edema  Lab Results  Component Value Date   CREATININE  1.87 (H) 01/08/2018   CREATININE 1.88 (H) 10/21/2017   CREATININE 1.58 (H) 12/20/2016     HYPERLIPIDEMIA: The lipid abnormality consists of elevated LDL which is mild and well controlled with pravastatin 20 mg LDL consistently below 100 She thinks she had a rash from Lipitor    Lab Results  Component Value Date   CHOL 174 01/08/2018   HDL 58.40 01/08/2018   LDLCALC 96 01/08/2018   TRIG 97.0 01/08/2018   CHOLHDL 3 01/08/2018    She has a long history of primary HYPOTHYROIDISM   She takes SYNTHROID on an empty stomach before breakfast and has taken this regularly  TSH has been normal with taking 75 mcg daily    Lab Results  Component Value Date   TSH 1.64 10/21/2017   TSH 0.68 12/20/2016   TSH 1.63 07/26/2016   FREET4 1.35 12/20/2016   FREET4 1.07 04/18/2016   FREET4 1.37 07/13/2015    NEUROPATHY:  No symptoms of numbness In the feet  She thinks she is taking gabapentin because of bladder issues and recommended by a urologist a few years ago    Diabetic foot exam in 7/18: Normal monofilament sensation, also has absent pulses   OSTEOPENIA: Her T score Previously done by gynecologist was -2.1, currently on Boniva started in 01/2014  Last bone density was -2.6 in 2/18, this was ordered by her gastroenterologist    Examination:   BP 132/64   Pulse (!) 55   Ht 5' (1.524 m)   Wt 113 lb (51.3 kg)   SpO2 99%  BMI 22.07 kg/m    Body mass index is 22.07 kg/m.     ASSESSMENT/ PLAN:    Diabetes type 2, Nonobese   See history of present illness for detailed discussion of current diabetes management, blood sugar patterns and problems identified  Fructosamine is high normal at 269  She came in because she was concerned about high readings after supper which are averaging over 179 Again her diet is variable and appears to be having relatively higher carbohydrate meals in the evening with only a little protein in the form of lentils  Also she is taking only 50  mg acarbose at that time but 100 in the morning Likely she does not need 100 mg acarbose in the morning since PC breakfast readings are looking good Because of her renal function her Janumet dose is limited  Will recommend that she try Prandin 0.5 mg before suppertime, discussed potential for hypoglycemia She will take her Janumet in the morning and only 1 Precose at breakfast She will take 2 glucose tablets at dinnertime  LIPIDS: Well controlled with pravastatin  CKD: Followed by PCP   HYPOTHYROIDISM: TSH to be rechecked on the next visit  There are no Patient Instructions on file for this visit.    Elayne Snare 01/09/2018, 8:39 AM

## 2018-01-09 NOTE — Patient Instructions (Signed)
Start checking blood sugars after breakfast on some days also  Reduce amount of starchy foods like rice at dinnertime  Change acarbose as follows Take 1 tablet right before breakfast and 2 tablets right before dinner  Repaglinide is a new medication needs to be taken just before eating dinner.  If blood sugar starts getting below 70 may need to stop this Change Janumet to the morning

## 2018-01-13 ENCOUNTER — Other Ambulatory Visit: Payer: Self-pay | Admitting: Endocrinology

## 2018-01-16 DIAGNOSIS — N644 Mastodynia: Secondary | ICD-10-CM | POA: Diagnosis not present

## 2018-01-21 ENCOUNTER — Encounter

## 2018-01-21 ENCOUNTER — Encounter: Payer: Self-pay | Admitting: Podiatry

## 2018-01-21 ENCOUNTER — Ambulatory Visit (INDEPENDENT_AMBULATORY_CARE_PROVIDER_SITE_OTHER): Payer: Medicare Other | Admitting: Podiatry

## 2018-01-21 DIAGNOSIS — B351 Tinea unguium: Secondary | ICD-10-CM | POA: Diagnosis not present

## 2018-01-21 DIAGNOSIS — E1149 Type 2 diabetes mellitus with other diabetic neurological complication: Secondary | ICD-10-CM | POA: Diagnosis not present

## 2018-01-21 DIAGNOSIS — M79676 Pain in unspecified toe(s): Secondary | ICD-10-CM

## 2018-01-21 NOTE — Progress Notes (Signed)
Complaint:  Visit Type: Patient returns to my office for continued preventative foot care services. Complaint: Patient states" my nails have grown long and thick and become painful to walk and wear shoes" Patient has been diagnosed with DM with no foot complications. The patient presents for preventative foot care services. No changes to ROS  Podiatric Exam: Vascular: dorsalis pedis and posterior tibial pulses are palpable bilateral. Capillary return is immediate. Temperature gradient is WNL. Skin turgor WNL  Sensorium: Normal Semmes Weinstein monofilament test. Normal tactile sensation bilaterally. Nail Exam: Pt has thick disfigured discolored nails with subungual debris noted bilateral entire nail hallux through fifth toenails Ulcer Exam: There is no evidence of ulcer or pre-ulcerative changes or infection. Orthopedic Exam: Muscle tone and strength are WNL. No limitations in general ROM. No crepitus or effusions noted. Foot type and digits show no abnormalities. Bony prominences are unremarkable. Skin: No Porokeratosis. No infection or ulcers  Diagnosis:  Onychomycosis, , Pain in right toe, pain in left toes  Treatment & Plan Procedures and Treatment: Consent by patient was obtained for treatment procedures.   Debridement of mycotic and hypertrophic toenails, 1 through 5 bilateral and clearing of subungual debris. No ulceration, no infection noted. ABN signed for 2019 Return Visit-Office Procedure: Patient instructed to return to the office for a follow up visit 3 months for continued evaluation and treatment.    Cirilo Canner DPM 

## 2018-01-27 DIAGNOSIS — M542 Cervicalgia: Secondary | ICD-10-CM | POA: Insufficient documentation

## 2018-01-27 DIAGNOSIS — R6884 Jaw pain: Secondary | ICD-10-CM | POA: Diagnosis not present

## 2018-01-28 DIAGNOSIS — M25531 Pain in right wrist: Secondary | ICD-10-CM | POA: Diagnosis not present

## 2018-01-28 DIAGNOSIS — M65331 Trigger finger, right middle finger: Secondary | ICD-10-CM | POA: Diagnosis not present

## 2018-01-30 ENCOUNTER — Other Ambulatory Visit: Payer: Medicare Other

## 2018-02-04 ENCOUNTER — Ambulatory Visit: Payer: Medicare Other | Admitting: Endocrinology

## 2018-02-04 ENCOUNTER — Other Ambulatory Visit: Payer: Self-pay

## 2018-02-04 DIAGNOSIS — D631 Anemia in chronic kidney disease: Secondary | ICD-10-CM

## 2018-02-04 DIAGNOSIS — N183 Chronic kidney disease, stage 3 unspecified: Secondary | ICD-10-CM

## 2018-02-04 DIAGNOSIS — E538 Deficiency of other specified B group vitamins: Secondary | ICD-10-CM

## 2018-02-05 ENCOUNTER — Inpatient Hospital Stay: Payer: Medicare Other | Attending: Hematology and Oncology

## 2018-02-05 ENCOUNTER — Inpatient Hospital Stay: Payer: Medicare Other

## 2018-02-05 VITALS — BP 141/52 | HR 62 | Temp 97.7°F | Resp 17

## 2018-02-05 DIAGNOSIS — E538 Deficiency of other specified B group vitamins: Secondary | ICD-10-CM

## 2018-02-05 DIAGNOSIS — D631 Anemia in chronic kidney disease: Secondary | ICD-10-CM | POA: Diagnosis not present

## 2018-02-05 DIAGNOSIS — N183 Chronic kidney disease, stage 3 unspecified: Secondary | ICD-10-CM

## 2018-02-05 DIAGNOSIS — N189 Chronic kidney disease, unspecified: Secondary | ICD-10-CM | POA: Insufficient documentation

## 2018-02-05 LAB — CBC WITH DIFFERENTIAL (CANCER CENTER ONLY)
Abs Immature Granulocytes: 0.01 10*3/uL (ref 0.00–0.07)
Basophils Absolute: 0 10*3/uL (ref 0.0–0.1)
Basophils Relative: 0 %
Eosinophils Absolute: 0.6 10*3/uL — ABNORMAL HIGH (ref 0.0–0.5)
Eosinophils Relative: 9 %
HCT: 33 % — ABNORMAL LOW (ref 36.0–46.0)
Hemoglobin: 10.1 g/dL — ABNORMAL LOW (ref 12.0–15.0)
Immature Granulocytes: 0 %
Lymphocytes Relative: 32 %
Lymphs Abs: 2 10*3/uL (ref 0.7–4.0)
MCH: 26.4 pg (ref 26.0–34.0)
MCHC: 30.6 g/dL (ref 30.0–36.0)
MCV: 86.2 fL (ref 80.0–100.0)
Monocytes Absolute: 0.4 10*3/uL (ref 0.1–1.0)
Monocytes Relative: 6 %
Neutro Abs: 3.4 10*3/uL (ref 1.7–7.7)
Neutrophils Relative %: 53 %
Platelet Count: 200 10*3/uL (ref 150–400)
RBC: 3.83 MIL/uL — ABNORMAL LOW (ref 3.87–5.11)
RDW: 15.6 % — ABNORMAL HIGH (ref 11.5–15.5)
WBC Count: 6.4 10*3/uL (ref 4.0–10.5)
nRBC: 0 % (ref 0.0–0.2)

## 2018-02-05 LAB — IRON AND TIBC
Iron: 83 ug/dL (ref 41–142)
Saturation Ratios: 31 % (ref 21–57)
TIBC: 266 ug/dL (ref 236–444)
UIBC: 183 ug/dL (ref 120–384)

## 2018-02-05 LAB — FERRITIN: Ferritin: 216 ng/mL (ref 11–307)

## 2018-02-05 LAB — VITAMIN B12: Vitamin B-12: 470 pg/mL (ref 180–914)

## 2018-02-05 MED ORDER — DARBEPOETIN ALFA 200 MCG/0.4ML IJ SOSY
PREFILLED_SYRINGE | INTRAMUSCULAR | Status: AC
  Filled 2018-02-05: qty 0.4

## 2018-02-05 MED ORDER — CYANOCOBALAMIN 1000 MCG/ML IJ SOLN
INTRAMUSCULAR | Status: AC
  Filled 2018-02-05: qty 1

## 2018-02-05 MED ORDER — CYANOCOBALAMIN 1000 MCG/ML IJ SOLN
1000.0000 ug | Freq: Once | INTRAMUSCULAR | Status: AC
  Administered 2018-02-05: 1000 ug via INTRAMUSCULAR

## 2018-02-05 MED ORDER — DARBEPOETIN ALFA 200 MCG/0.4ML IJ SOSY
200.0000 ug | PREFILLED_SYRINGE | Freq: Once | INTRAMUSCULAR | Status: AC
  Administered 2018-02-05: 200 ug via SUBCUTANEOUS

## 2018-02-05 NOTE — Patient Instructions (Signed)
Cyanocobalamin, Pyridoxine, and Folate What is this medicine? A multivitamin containing folic acid, vitamin B6, and vitamin B12. This medicine may be used for other purposes; ask your health care provider or pharmacist if you have questions. COMMON BRAND NAME(S): AllanFol RX, AllanTex, Av-Vite FB, ComBgen, FaBB, Folamin, Folastin, [place], [place], [place], [place], [place], Folgard RX, [place] RX 2.2, [place], [place] 2.2, Foltabs 800, Foltx, Homocysteine Formula, [place], NuFol, TL FPL Group, Virt-Gard, Virt-Vite, Virt-Vite Slater, Vita-Respa What should I tell my health care provider before I take this medicine? They need to know if you have any of these conditions: -bleeding or clotting disorder -history of anemia of any type -other chronic health condition -an unusual or allergic reaction to vitamins, other medicines, foods, dyes, or preservatives -pregnant or trying to get pregnant -breast-feeding How should I use this medicine? Take by mouth with a glass of water. May take with food. Follow the directions on the prescription label. It is usually given once a day. Do not take your medicine more often than directed. Contact your pediatrician regarding the use of this medicine in children. Special care may be needed. Overdosage: If you think you have taken too much of this medicine contact a poison control center or emergency room at once. NOTE: This medicine is only for you. Do not share this medicine with others. What if I miss a dose? If you miss a dose, take it as soon as you can. If it is almost time for your next dose, take only that dose. Do not take double or extra doses. What may interact with this medicine? -levodopa This list may not describe all possible interactions. Give your health care provider a list of all the medicines, herbs, non-prescription drugs, or dietary supplements you use. Also tell them if you smoke, drink alcohol, or use illegal drugs. Some items may interact with  your medicine. What should I watch for while using this medicine? See your health care professional for regular checks on your progress. Remember that vitamin supplements do not replace the need for good nutrition from a balanced diet. What side effects may I notice from receiving this medicine? Side effects that you should report to your doctor or health care professional as soon as possible: -allergic reaction such as skin rash or difficulty breathing -vomiting Side effects that usually do not require medical attention (report to your doctor or health care professional if they continue or are bothersome): -nausea -stomach upset This list may not describe all possible side effects. Call your doctor for medical advice about side effects. You may report side effects to FDA at 1-800-FDA-1088. Where should I keep my medicine? Keep out of the reach of children. Most vitamins should be stored at controlled room temperature. Check your specific product directions. Protect from heat and moisture. Throw away any unused medicine after the expiration date. NOTE: This sheet is a summary. It may not cover all possible information. If you have questions about this medicine, talk to your doctor, pharmacist, or health care provider.  2018 Elsevier/Gold Standard (2007-05-02 00:59:55)  Darbepoetin Alfa injection What is this medicine? DARBEPOETIN ALFA (dar be POE e tin AL fa) helps your body make more red blood cells. It is used to treat anemia caused by chronic kidney failure and chemotherapy. This medicine may be used for other purposes; ask your health care provider or pharmacist if you have questions. COMMON BRAND NAME(S): Aranesp What should I tell my health care provider before I take this medicine? They need to know  if you have any of these conditions: -blood clotting disorders or history of blood clots -cancer patient not on chemotherapy -cystic fibrosis -heart disease, such as angina, heart  failure, or a history of a heart attack -hemoglobin level of 12 g/dL or greater -high blood pressure -low levels of folate, iron, or vitamin B12 -seizures -an unusual or allergic reaction to darbepoetin, erythropoietin, albumin, hamster proteins, latex, other medicines, foods, dyes, or preservatives -pregnant or trying to get pregnant -breast-feeding How should I use this medicine? This medicine is for injection into a vein or under the skin. It is usually given by a health care professional in a hospital or clinic setting. If you get this medicine at home, you will be taught how to prepare and give this medicine. Use exactly as directed. Take your medicine at regular intervals. Do not take your medicine more often than directed. It is important that you put your used needles and syringes in a special sharps container. Do not put them in a trash can. If you do not have a sharps container, call your pharmacist or healthcare provider to get one. A special MedGuide will be given to you by the pharmacist with each prescription and refill. Be sure to read this information carefully each time. Talk to your pediatrician regarding the use of this medicine in children. While this medicine may be used in children as young as 1 year for selected conditions, precautions do apply. Overdosage: If you think you have taken too much of this medicine contact a poison control center or emergency room at once. NOTE: This medicine is only for you. Do not share this medicine with others. What if I miss a dose? If you miss a dose, take it as soon as you can. If it is almost time for your next dose, take only that dose. Do not take double or extra doses. What may interact with this medicine? Do not take this medicine with any of the following medications: -epoetin alfa This list may not describe all possible interactions. Give your health care provider a list of all the medicines, herbs, non-prescription drugs, or  dietary supplements you use. Also tell them if you smoke, drink alcohol, or use illegal drugs. Some items may interact with your medicine. What should I watch for while using this medicine? Your condition will be monitored carefully while you are receiving this medicine. You may need blood work done while you are taking this medicine. What side effects may I notice from receiving this medicine? Side effects that you should report to your doctor or health care professional as soon as possible: -allergic reactions like skin rash, itching or hives, swelling of the face, lips, or tongue -breathing problems -changes in vision -chest pain -confusion, trouble speaking or understanding -feeling faint or lightheaded, falls -high blood pressure -muscle aches or pains -pain, swelling, warmth in the leg -rapid weight gain -severe headaches -sudden numbness or weakness of the face, arm or leg -trouble walking, dizziness, loss of balance or coordination -seizures (convulsions) -swelling of the ankles, feet, hands -unusually weak or tired Side effects that usually do not require medical attention (report to your doctor or health care professional if they continue or are bothersome): -diarrhea -fever, chills (flu-like symptoms) -headaches -nausea, vomiting -redness, stinging, or swelling at site where injected This list may not describe all possible side effects. Call your doctor for medical advice about side effects. You may report side effects to FDA at 1-800-FDA-1088. Where should I keep my medicine?  Keep out of the reach of children. Store in a refrigerator between 2 and 8 degrees C (36 and 46 degrees F). Do not freeze. Do not shake. Throw away any unused portion if using a single-dose vial. Throw away any unused medicine after the expiration date. NOTE: This sheet is a summary. It may not cover all possible information. If you have questions about this medicine, talk to your doctor, pharmacist,  or health care provider.  2018 Elsevier/Gold Standard (2015-10-30 19:52:26)

## 2018-02-09 ENCOUNTER — Telehealth: Payer: Self-pay | Admitting: Hematology and Oncology

## 2018-02-09 NOTE — Telephone Encounter (Signed)
Abigail Hoover PAL 12/12. Called patient re rescheduling annual f/u 1212. Per patient cancel appointment and she will call us when she wants to come in. Patient sees both Abigail Hoover and Abigail Hoover. Patient is scheduled for lab/Abigail Hoover/inj 12/19.

## 2018-02-10 ENCOUNTER — Telehealth: Payer: Self-pay | Admitting: Endocrinology

## 2018-02-10 ENCOUNTER — Other Ambulatory Visit: Payer: Self-pay

## 2018-02-10 DIAGNOSIS — N184 Chronic kidney disease, stage 4 (severe): Secondary | ICD-10-CM | POA: Diagnosis not present

## 2018-02-10 DIAGNOSIS — N189 Chronic kidney disease, unspecified: Secondary | ICD-10-CM | POA: Diagnosis not present

## 2018-02-10 MED ORDER — REPAGLINIDE 0.5 MG PO TABS: 0.5000 mg | ORAL_TABLET | Freq: Every day | ORAL | 2 refills | Status: DC

## 2018-02-10 NOTE — Telephone Encounter (Signed)
Sent prescription again pharmacy notified

## 2018-02-10 NOTE — Telephone Encounter (Signed)
Pharmacy needs new RX directions for repaglinide (PRANDIN) 0.5 MG tablet. Please Advise, thanks.

## 2018-02-10 NOTE — Telephone Encounter (Signed)
Abigail Hoover from [facility]. He need verification on the RX called in to them.  He states that delivery to patient is pending

## 2018-02-10 NOTE — Telephone Encounter (Signed)
Sent!

## 2018-02-12 ENCOUNTER — Telehealth: Payer: Self-pay | Admitting: Endocrinology

## 2018-02-12 ENCOUNTER — Other Ambulatory Visit: Payer: Self-pay

## 2018-02-12 ENCOUNTER — Other Ambulatory Visit: Payer: Self-pay | Admitting: Endocrinology

## 2018-02-12 MED ORDER — REPAGLINIDE 0.5 MG PO TABS: ORAL_TABLET | ORAL | 3 refills | Status: DC

## 2018-02-12 NOTE — Telephone Encounter (Signed)
[facility] has called stating about the dosage confusion with repaglinide (PRANDIN) 0.5 MG tablet. They thought it was supposed to be 2-3 times a day. Please Advise, thanks

## 2018-02-12 NOTE — Telephone Encounter (Signed)
Sent in new prescription with new instructions to take 1 tablet before breakfast and 2 tablets before supper per MD last note

## 2018-02-16 DIAGNOSIS — N2581 Secondary hyperparathyroidism of renal origin: Secondary | ICD-10-CM | POA: Diagnosis not present

## 2018-02-16 DIAGNOSIS — D631 Anemia in chronic kidney disease: Secondary | ICD-10-CM | POA: Diagnosis not present

## 2018-02-16 DIAGNOSIS — M65331 Trigger finger, right middle finger: Secondary | ICD-10-CM | POA: Diagnosis not present

## 2018-02-16 DIAGNOSIS — N184 Chronic kidney disease, stage 4 (severe): Secondary | ICD-10-CM | POA: Diagnosis not present

## 2018-02-16 DIAGNOSIS — I129 Hypertensive chronic kidney disease with stage 1 through stage 4 chronic kidney disease, or unspecified chronic kidney disease: Secondary | ICD-10-CM | POA: Diagnosis not present

## 2018-02-26 ENCOUNTER — Other Ambulatory Visit (HOSPITAL_COMMUNITY): Payer: Self-pay | Admitting: *Deleted

## 2018-02-27 ENCOUNTER — Ambulatory Visit (HOSPITAL_COMMUNITY)
Admission: RE | Admit: 2018-02-27 | Discharge: 2018-02-27 | Disposition: A | Discharge status: Home / Self Care | Payer: Medicare Other | Source: Ambulatory Visit | Attending: Nephrology | Admitting: Nephrology

## 2018-02-27 DIAGNOSIS — N189 Chronic kidney disease, unspecified: Secondary | ICD-10-CM | POA: Diagnosis not present

## 2018-02-27 DIAGNOSIS — D631 Anemia in chronic kidney disease: Secondary | ICD-10-CM | POA: Insufficient documentation

## 2018-02-27 MED ORDER — SODIUM CHLORIDE 0.9 % IV SOLN
510.0000 mg | INTRAVENOUS | Status: DC
  Administered 2018-02-27: 510 mg via INTRAVENOUS
  Filled 2018-02-27: qty 510

## 2018-03-02 DIAGNOSIS — M79641 Pain in right hand: Secondary | ICD-10-CM | POA: Diagnosis not present

## 2018-03-03 ENCOUNTER — Other Ambulatory Visit (INDEPENDENT_AMBULATORY_CARE_PROVIDER_SITE_OTHER): Payer: Medicare Other

## 2018-03-03 DIAGNOSIS — E039 Hypothyroidism, unspecified: Secondary | ICD-10-CM

## 2018-03-03 DIAGNOSIS — E1165 Type 2 diabetes mellitus with hyperglycemia: Secondary | ICD-10-CM | POA: Diagnosis not present

## 2018-03-03 LAB — COMPREHENSIVE METABOLIC PANEL
ALT: 25 U/L (ref 0–35)
AST: 27 U/L (ref 0–37)
Albumin: 4 g/dL (ref 3.5–5.2)
Alkaline Phosphatase: 82 U/L (ref 39–117)
BUN: 35 mg/dL — ABNORMAL HIGH (ref 6–23)
CO2: 30 mEq/L (ref 19–32)
Calcium: 9.5 mg/dL (ref 8.4–10.5)
Chloride: 101 mEq/L (ref 96–112)
Creatinine, Ser: 1.69 mg/dL — ABNORMAL HIGH (ref 0.40–1.20)
GFR: 31.36 mL/min — ABNORMAL LOW (ref >60.00)
Glucose, Bld: 147 mg/dL — ABNORMAL HIGH (ref 70–99)
Potassium: 4.7 mEq/L (ref 3.5–5.1)
Sodium: 136 mEq/L (ref 135–145)
Total Bilirubin: 0.4 mg/dL (ref 0.2–1.2)
Total Protein: 6.3 g/dL (ref 6.0–8.3)

## 2018-03-03 LAB — TSH: TSH: 3.36 u[IU]/mL (ref 0.35–4.50)

## 2018-03-03 LAB — HEMOGLOBIN A1C: Hgb A1c MFr Bld: 6.3 % (ref 4.6–6.5)

## 2018-03-05 ENCOUNTER — Ambulatory Visit (INDEPENDENT_AMBULATORY_CARE_PROVIDER_SITE_OTHER): Payer: Medicare Other | Admitting: Endocrinology

## 2018-03-05 ENCOUNTER — Encounter: Payer: Self-pay | Admitting: Endocrinology

## 2018-03-05 ENCOUNTER — Ambulatory Visit: Payer: Medicare Other | Admitting: Hematology and Oncology

## 2018-03-05 VITALS — BP 138/62 | HR 60 | Ht 61.0 in | Wt 119.6 lb

## 2018-03-05 DIAGNOSIS — E1165 Type 2 diabetes mellitus with hyperglycemia: Secondary | ICD-10-CM | POA: Diagnosis not present

## 2018-03-05 MED ORDER — REPAGLINIDE 1 MG PO TABS: 1.0000 mg | ORAL_TABLET | Freq: Two times a day (BID) | ORAL | 3 refills | Status: DC

## 2018-03-05 MED ORDER — SITAGLIPTIN PHOS-METFORMIN HCL 50-500 MG PO TABS: 1.0000 | ORAL_TABLET | Freq: Every day | ORAL | 2 refills | Status: DC

## 2018-03-05 NOTE — Progress Notes (Signed)
Patient ID: Abigail Hoover, female   DOB: [birth year], 72 y.o.   MRN: [id number]    Reason for Appointment:  follow-up of various issues and high calcium  History of Present Illness   Diagnosis: Type 2 DIABETES MELITUS, long-standing  She has had mild diabetes for several years but has required multiple drugs for control Usually has had postprandial hyperglycemia, this has been better controlled with adding Glyset to her main meals Has never been on insulin or a GLP-1 drug Actos had been stopped previously because of tendency to edema  Recent history:   Oral hypoglycemic drugs:  Precose 50 mg before breakfast, 50 mg at snack and 100 mg dinner, Prandin 0.5 mg at dinnertime  Her A1c is usually lower than expected now 6.3 compared to 5.7 Last fructosamine 269  Current management, problems identified and blood sugar patterns:  She had a relatively high creatinine of 1.89 on her last visit and she was told to stop Janumet  She was also started on PRANDIN 0.5 mg at suppertime  She is now taking 50 mg at breakfast, does not eat lunch and has 50 mg with her fruit snack in the afternoon  However she is concerned about her blood sugar being higher and they are slightly higher than usual at her last visit after supper when they were averaging 175  As before she is having good FASTING blood sugar readings consistently  She has a couple of readings 2 to 3 hours after breakfast also which are over 200  As before she is eating mostly high carbohydrate meals with very little protein such as lentils  She has however gained weight since her last visit  She does have an occasional blood sugar as low as 105 after evening meal but no hypoglycemia at any time  Side effects from medications: None Proper timing of medications in relation to meals: Yes.          Monitors blood glucose: 1 time a day or less .    Glucometer: One Touch        Blood Glucose readings from download     PRE-MEAL Fasting Lunch Dinner Bedtime Overall  Glucose range:  79-166      Mean/median:  103     168   POST-MEAL PC Breakfast PC Lunch PC Dinner  Glucose range:  198-225   105-242  Mean/median:    187   PREVIOUS readings  PRE-MEAL Fasting Lunch Dinner Bedtime Overall  Glucose range:  92-98      Mean/median:     170   POST-MEAL PC Breakfast PC Lunch PC Dinner  Glucose range:   106, 170  130-205  Mean/median:    175      Meals: 2- 3 meals per day.  eating small breakfast and lunch but with only little protein, is vegetarian   EXERCISE:  Only with housework            Dietician visit: Most recent: None       Complications: are mild symptoms of neuropathy, no nephropathy or retinopathy   Wt Readings from Last 3 Encounters:  03/05/18 119 lb 9.6 oz (54.3 kg)  02/27/18 116 lb (52.6 kg)  01/09/18 113 lb (51.3 kg)   Lab Results  Component Value Date   HGBA1C 6.3 03/03/2018   HGBA1C 5.7 10/21/2017   HGBA1C 6.1 05/27/2017   Lab Results  Component Value Date   MICROALBUR 1.0 10/21/2017   LDLCALC 96 01/08/2018   CREATININE 1.69 (  H) 03/03/2018      Lab Results  Component Value Date   FRUCTOSAMINE 269 01/08/2018   FRUCTOSAMINE 266 07/26/2016   FRUCTOSAMINE 230 12/07/2014     OTHER problems Addressed today are in review of systems:    Allergies as of 03/05/2018      Reactions   Atorvastatin Rash   Ezetimibe Itching      Medication List       Accurate as of March 05, 2018  1:30 PM. Always use your most recent med list.        acarbose 50 MG tablet Commonly known as:  PRECOSE TAKE 1 TABLET AT BREAKFAST, 1 TABLET AT LUNCH AND 1 & 1/2 TABLET AT BEDTIME   aspirin 81 MG EC tablet Take 1 tablet (81 mg total) by mouth 2 (two) times daily with a meal. For 6 weeks   DEXILANT 60 MG capsule Generic drug:  dexlansoprazole   furosemide 40 MG tablet Commonly known as:  LASIX Take 40 mg by mouth every morning.   gabapentin 300 MG capsule Commonly known  as:  NEURONTIN Take 300 mg by mouth 4 (four) times daily.   hydrocortisone 2.5 % ointment APPLY TO AFFECED ARES(S) TOPICALLY TWICE DAILY AS NEEDED   ibandronate 150 MG tablet Commonly known as:  BONIVA   isosorbide mononitrate 60 MG 24 hr tablet Commonly known as:  IMDUR Take 60 mg by mouth daily.   magnesium oxide 400 MG tablet Commonly known as:  MAG-OX Take 400 mg by mouth 2 (two) times daily.   metoprolol succinate 50 MG 24 hr tablet Commonly known as:  TOPROL-XL Take 1 tablet (50 mg total) by mouth daily. Take with or immediately following a meal.   MYRBETRIQ 50 MG Tb24 tablet Generic drug:  mirabegron ER Take 50 mg by mouth daily.   nitroGLYCERIN 0.4 MG SL tablet Commonly known as:  NITROSTAT Place 1 tablet (0.4 mg total) under the tongue every 5 (five) minutes x 3 doses as needed for chest pain.   ONE TOUCH SURESOFT Misc Use to check blood sugars   onetouch ultrasoft lancets Use to test blood sugar once daily   ONETOUCH VERIO test strip Generic drug:  glucose blood USE AS INSTRUCTED TO CHECK BLOOD SUGAR ONCE A DAY ALTERNATING BETWEEN FASTING GLUCOSE CLECKS AND AFTER MEAL CHECKS.   pentosan polysulfate 100 MG capsule Commonly known as:  ELMIRON Take 100 mg by mouth 3 (three) times daily before meals.   pravastatin 20 MG tablet Commonly known as:  PRAVACHOL TAKE ONE TABLET BY MOUTH ONCE DAILY   repaglinide 0.5 MG tablet Commonly known as:  PRANDIN Take 1 tablet before breakfast and 2 tablets before supper   SYNTHROID 75 MCG tablet Generic drug:  levothyroxine TAKE ONE TABLET BY MOUTH ONCE DAILY   triamcinolone ointment 0.1 % Commonly known as:  KENALOG APPLY THIN LAYER ON THE SKIN TWICE A DAY FOR 3 WEEKS       Allergies:  Allergies  Allergen Reactions  . Atorvastatin Rash  . Ezetimibe Itching    Past Medical History:  Diagnosis Date  . 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 Anxiety   . Arthritis   . Bladder irritation   . Diabetes  mellitus   . GERD (gastroesophageal reflux disease)   . Hypercholesterolemia   . Hypertension   . Hypothyroidism   . Iron deficiency anemia, unspecified   . Psoriasis   . Transfusion history 03/05/2016   for postoperative (ORIF) anemia superimposed on chronic  anemia    Past Surgical History:  Procedure Laterality Date  . ABDOMINAL HYSTERECTOMY     TAH  . BREAST BIOPSY     Left breast biopsy benign lesion  . BREAST BIOPSY  03/11/2012   Procedure: BREAST BIOPSY;  Surgeon: Odis Hollingshead, MD;  Location: [facility];  Service: General;  Laterality: Left;  remove left breast mass  . BREAST EXCISIONAL BIOPSY Left   . CARDIAC CATHETERIZATION N/A 01/02/2015   Procedure: Left Heart Cath and Coronary Angiography;  Surgeon: Charolette Forward, MD;  Location: [facility];  Service: Cardiovascular;  Laterality: N/A;  . CATARACT EXTRACTION    . CERVICAL DISC SURGERY    . COLONOSCOPY  2013   neg. next 2023.   Marland Kitchen EYE SURGERY    . LEFT HIP SURGERY    . TOTAL HIP ARTHROPLASTY Left 03/02/2016   Procedure: TOTAL HIP ARTHROPLASTY ANTERIOR APPROACH;  Surgeon: Rod Can, MD;  Location: [place];  Service: Orthopedics;  Laterality: Left;    Family History  Problem Relation Age of Onset  . Heart disease Mother        heart attack  . Stroke Father        brain hemorrhage  . Diabetes Brother   . Cancer Brother   . Breast cancer Sister   . Cancer Sister        breast cancer    Social History:  reports that she has never smoked. She has never used smokeless tobacco. She reports that she does not drink alcohol or use drugs.  Review of Systems:     HYPERTENSION: Mild, this is well-controlled with metoprolol 50 mg only Followed by cardiologist  RENAL dysfunction has been variable and followed by nephrologist No hyperkalemia  She takes Lasix 40 mg for  edema  Lab Results  Component Value Date   CREATININE 1.69 (H) 03/03/2018   CREATININE 1.87 (H) 01/08/2018   CREATININE 1.88 (H)  10/21/2017     HYPERLIPIDEMIA: The lipid abnormality consists of elevated LDL which is mild and well controlled with pravastatin 20 mg LDL consistently below 100 She thinks she had a rash from Lipitor    Lab Results  Component Value Date   CHOL 174 01/08/2018   HDL 58.40 01/08/2018   LDLCALC 96 01/08/2018   TRIG 97.0 01/08/2018   CHOLHDL 3 01/08/2018    She has a long history of primary HYPOTHYROIDISM   She takes SYNTHROID daily before breakfast  TSH has been consistently normal with taking 75 mcg daily    Lab Results  Component Value Date   TSH 3.36 03/03/2018   TSH 1.64 10/21/2017   TSH 0.68 12/20/2016   FREET4 1.35 12/20/2016   FREET4 1.07 04/18/2016   FREET4 1.37 07/13/2015     Apparently taking gabapentin because of bladder issues and recommended by a urologist a few years ago    Diabetic foot exam in 12/19: Normal monofilament sensation   OSTEOPENIA: Her T score Previously done by gynecologist was -2.1, currently on Boniva started in 01/2014  Last bone density was -2.6 in 2/18, this was ordered by her gastroenterologist    Examination:   BP 138/62 (BP Location: Left Arm, Patient Position: Sitting, Cuff Size: Normal)   Pulse 60   Ht 5\' 1"  (1.549 m)   Wt 119 lb 9.6 oz (54.3 kg)   SpO2 99%   BMI 22.60 kg/m   Body mass index is 22.6 kg/m.    Diabetic Foot Exam - Simple  Simple Foot Form Diabetic Foot exam was performed with the following findings:  Yes 03/05/2018  1:49 PM  Visual Inspection No deformities, no ulcerations, no other skin breakdown bilaterally:  Yes Sensation Testing Intact to touch and monofilament testing bilaterally:  Yes Pulse Check Posterior Tibialis and Dorsalis pulse intact bilaterally:  Yes Comments    Only trace ankle edema present  ASSESSMENT/ PLAN:    Diabetes type 2, Nonobese   See history of present illness for detailed discussion of current diabetes management, blood sugar patterns and problems identified  Her  A1c is still excellent at 6.3 although higher than before  She is concerned about her blood sugars being high after meals although inconsistent and averaging about 180 after dinner Blood sugars may be higher with stopping Janumet because of renal dysfunction  Does not appear to be responding with Prandin low dose of 0.5 mg at dinnertime Also acarbose was increased to 100 mg before dinner with little benefit Recommendations:  Encouraged her to add a protein like yogurt at dinnertime since her evening meal is mostly carbohydrate-based  We will change her Prandin to 1 mg and also add a dose to her breakfast since postprandial readings midday are high also  Continue acarbose but does not need to take any with her fruit snack in the afternoon  Trial of Janumet 50/500 with breakfast once a day since her renal function is adequate for low-dose metformin and she had tolerated this fairly well before  Foot exam: Normal  CKD: Followed by PCP and recently improved, she believes her creatinine was 1.5 with nephrologist   HYPOTHYROIDISM: TSH normal again  There are no Patient Instructions on file for this visit.    Elayne Snare 03/05/2018, 1:30 PM

## 2018-03-05 NOTE — Patient Instructions (Signed)
Add yogurt daily to dinnertime  PRANDIN or repaglinide: Take 2 tablets of 0.5 mg before breakfast and dinner When using 1 mg this will be 1 tablet at breakfast and dinner  Stop taking acarbose at Tea time and only take 1 tablet in the morning and 2 tablets before dinner  Janumet 1 tablet at breakfast

## 2018-03-06 ENCOUNTER — Ambulatory Visit (HOSPITAL_COMMUNITY)
Admission: RE | Admit: 2018-03-06 | Discharge: 2018-03-06 | Disposition: A | Discharge status: Home / Self Care | Payer: Medicare Other | Source: Ambulatory Visit | Attending: Nephrology | Admitting: Nephrology

## 2018-03-06 DIAGNOSIS — D631 Anemia in chronic kidney disease: Secondary | ICD-10-CM | POA: Insufficient documentation

## 2018-03-06 DIAGNOSIS — N189 Chronic kidney disease, unspecified: Secondary | ICD-10-CM | POA: Insufficient documentation

## 2018-03-06 MED ORDER — SODIUM CHLORIDE 0.9 % IV SOLN
510.0000 mg | INTRAVENOUS | Status: DC
  Administered 2018-03-06: 510 mg via INTRAVENOUS
  Filled 2018-03-06: qty 17

## 2018-03-09 ENCOUNTER — Ambulatory Visit: Payer: Medicare Other | Admitting: Endocrinology

## 2018-03-12 ENCOUNTER — Telehealth: Payer: Self-pay | Admitting: Hematology and Oncology

## 2018-03-12 ENCOUNTER — Inpatient Hospital Stay: Payer: Medicare Other | Attending: Hematology and Oncology

## 2018-03-12 ENCOUNTER — Inpatient Hospital Stay: Payer: Medicare Other

## 2018-03-12 ENCOUNTER — Encounter: Payer: Self-pay | Admitting: Hematology and Oncology

## 2018-03-12 ENCOUNTER — Inpatient Hospital Stay (HOSPITAL_BASED_OUTPATIENT_CLINIC_OR_DEPARTMENT_OTHER): Payer: Medicare Other | Admitting: Hematology and Oncology

## 2018-03-12 ENCOUNTER — Other Ambulatory Visit: Payer: Self-pay | Admitting: Hematology and Oncology

## 2018-03-12 DIAGNOSIS — L853 Xerosis cutis: Secondary | ICD-10-CM | POA: Insufficient documentation

## 2018-03-12 DIAGNOSIS — D631 Anemia in chronic kidney disease: Secondary | ICD-10-CM

## 2018-03-12 DIAGNOSIS — N189 Chronic kidney disease, unspecified: Secondary | ICD-10-CM | POA: Insufficient documentation

## 2018-03-12 DIAGNOSIS — Z9189 Other specified personal risk factors, not elsewhere classified: Secondary | ICD-10-CM

## 2018-03-12 DIAGNOSIS — N183 Chronic kidney disease, stage 3 unspecified: Secondary | ICD-10-CM

## 2018-03-12 DIAGNOSIS — E538 Deficiency of other specified B group vitamins: Secondary | ICD-10-CM | POA: Insufficient documentation

## 2018-03-12 DIAGNOSIS — Z803 Family history of malignant neoplasm of breast: Secondary | ICD-10-CM

## 2018-03-12 LAB — CBC WITH DIFFERENTIAL/PLATELET
Abs Immature Granulocytes: 0.02 10*3/uL (ref 0.00–0.07)
Basophils Absolute: 0 10*3/uL (ref 0.0–0.1)
Basophils Relative: 0 %
Eosinophils Absolute: 0.3 10*3/uL (ref 0.0–0.5)
Eosinophils Relative: 6 %
HCT: 37.1 % (ref 36.0–46.0)
Hemoglobin: 11 g/dL — ABNORMAL LOW (ref 12.0–15.0)
Immature Granulocytes: 0 %
Lymphocytes Relative: 30 %
Lymphs Abs: 1.8 10*3/uL (ref 0.7–4.0)
MCH: 26.6 pg (ref 26.0–34.0)
MCHC: 29.6 g/dL — ABNORMAL LOW (ref 30.0–36.0)
MCV: 89.8 fL (ref 80.0–100.0)
Monocytes Absolute: 0.3 10*3/uL (ref 0.1–1.0)
Monocytes Relative: 5 %
Neutro Abs: 3.5 10*3/uL (ref 1.7–7.7)
Neutrophils Relative %: 59 %
Platelets: 180 10*3/uL (ref 150–400)
RBC: 4.13 MIL/uL (ref 3.87–5.11)
RDW: 15.4 % (ref 11.5–15.5)
WBC: 6 10*3/uL (ref 4.0–10.5)
nRBC: 0 % (ref 0.0–0.2)

## 2018-03-12 MED ORDER — DARBEPOETIN ALFA 200 MCG/0.4ML IJ SOSY
PREFILLED_SYRINGE | INTRAMUSCULAR | Status: AC
  Filled 2018-03-12: qty 0.4

## 2018-03-12 MED ORDER — CYANOCOBALAMIN 1000 MCG/ML IJ SOLN
INTRAMUSCULAR | Status: AC
  Filled 2018-03-12: qty 1

## 2018-03-12 NOTE — Assessment & Plan Note (Signed)
She had negative mammogram in September. She will continue close follow-up

## 2018-03-12 NOTE — Progress Notes (Signed)
[facility] OFFICE PROGRESS NOTE  [name], MD  ASSESSMENT & PLAN:  Anemia in chronic kidney disease She tolerated injections well  Her primary doctor has given her intravenous iron infusion recently Her hemoglobin is consistently greater than 10, occasionally greater than 11 I will space out her darbepoetin injection to every 6 weeks, with goals to keep hemoglobin greater than 10   Vitamin B 12 deficiency She has received B12 injections Her last B12 level was satisfactory She will continue to receive B12 injection  At high risk for breast cancer She had negative mammogram in September. She will continue close follow-up  Dry skin She has profound dry skin I recommend topical emollient cream on a regular basis   No orders of the defined types were placed in this encounter.   INTERVAL HISTORY: Abigail Hoover 72 y.o. female returns for further follow-up for anemia and high risk for breast cancer She feels well She has received intravenous iron infusion recently The patient denies any recent signs or symptoms of bleeding such as spontaneous epistaxis, hematuria or hematochezia. She denies any recent abnormal breast examination, palpable mass, abnormal breast appearance or nipple changes She denies recent infection, fever or chills  SUMMARY OF HEMATOLOGIC HISTORY:  This is a patient that has been followed here since 2011 for chronic anemia In 2011, she had a bone marrow aspirate and biopsy that came back normal. The patient has been receiving Aranesp injection for chronic anemia up until 2014. It was subsequently discontinued due stability of the anemia. Starting 05/27/2014, darbepoetin was resumed at 200 g every other month to keep hemoglobin greater than 10 g. Treatment was discontinued in October 2016 and resumed in September 2017 The patient is subsequently found to have severe vitamin B12 deficiency and is started on vitamin B12 injection on December 25, 2016 along with resumption of darbepoetin injection every 5 to 6 weeks.  She is also seen due to family history of breast cancer  I have reviewed the past medical history, past surgical history, social history and family history with the patient and they are unchanged from previous note.  ALLERGIES:  is allergic to atorvastatin and ezetimibe.  MEDICATIONS:  Current Outpatient Medications  Medication Sig Dispense Refill  . acarbose (PRECOSE) 50 MG tablet TAKE 1 TABLET AT BREAKFAST, 1 TABLET AT LUNCH AND 1 & 1/2 TABLET AT BEDTIME 105 tablet 0  . aspirin 81 MG EC tablet Take 1 tablet (81 mg total) by mouth 2 (two) times daily with a meal. For 6 weeks    . DEXILANT 60 MG capsule     . furosemide (LASIX) 40 MG tablet Take 40 mg by mouth every morning.  7  . gabapentin (NEURONTIN) 300 MG capsule Take 300 mg by mouth 4 (four) times daily.    . hydrocortisone 2.5 % ointment APPLY TO AFFECED ARES(S) TOPICALLY TWICE DAILY AS NEEDED  3  . ibandronate (BONIVA) 150 MG tablet     . isosorbide mononitrate (IMDUR) 60 MG 24 hr tablet Take 60 mg by mouth daily.  3  . Lancets (ONETOUCH ULTRASOFT) lancets Use to test blood sugar once daily 100 each 8  . Lancets Misc. (ONE TOUCH SURESOFT) MISC Use to check blood sugars 1 each 3  . magnesium oxide (MAG-OX) 400 MG tablet Take 400 mg by mouth 2 (two) times daily.    . metoprolol succinate (TOPROL-XL) 50 MG 24 hr tablet Take 1 tablet (50 mg total) by mouth daily. Take with or immediately  following a meal. 30 tablet 3  . MYRBETRIQ 50 MG TB24 tablet Take 50 mg by mouth daily.  3  . nitroGLYCERIN (NITROSTAT) 0.4 MG SL tablet Place 1 tablet (0.4 mg total) under the tongue every 5 (five) minutes x 3 doses as needed for chest pain. 25 tablet 12  . ONETOUCH VERIO test strip USE AS INSTRUCTED TO CHECK BLOOD SUGAR ONCE A DAY ALTERNATING BETWEEN FASTING GLUCOSE CLECKS AND AFTER MEAL CHECKS. 100 each 8  . pentosan polysulfate (ELMIRON) 100 MG capsule Take 100 mg by mouth 3  (three) times daily before meals.     . pravastatin (PRAVACHOL) 20 MG tablet TAKE ONE TABLET BY MOUTH ONCE DAILY 90 tablet 0  . repaglinide (PRANDIN) 1 MG tablet Take 1 tablet (1 mg total) by mouth 2 (two) times daily before a meal. 60 tablet 3  . sitaGLIPtin-metformin (JANUMET) 50-500 MG tablet Take 1 tablet by mouth daily with breakfast. 30 tablet 2  . SYNTHROID 75 MCG tablet TAKE ONE TABLET BY MOUTH ONCE DAILY 90 tablet 0  . triamcinolone ointment (KENALOG) 0.1 % APPLY THIN LAYER ON THE SKIN TWICE A DAY FOR 3 WEEKS  0   No current facility-administered medications for this visit.      REVIEW OF SYSTEMS:   Constitutional: Denies fevers, chills or night sweats Eyes: Denies blurriness of vision Ears, nose, mouth, throat, and face: Denies mucositis or sore throat Respiratory: Denies cough, dyspnea or wheezes Cardiovascular: Denies palpitation, chest discomfort or lower extremity swelling Gastrointestinal:  Denies nausea, heartburn or change in bowel habits Skin: Denies abnormal skin rashes Lymphatics: Denies new lymphadenopathy or easy bruising Neurological:Denies numbness, tingling or new weaknesses Behavioral/Psych: Mood is stable, no new changes  All other systems were reviewed with the patient and are negative.  PHYSICAL EXAMINATION: ECOG PERFORMANCE STATUS: 1 - Symptomatic but completely ambulatory  Vitals:   03/12/18 1148  BP: 140/76  Pulse: (!) 59  Resp: 18  Temp: (!) 97.5 F (36.4 C)  SpO2: 100%   Filed Weights   03/12/18 1148  Weight: 124 lb (56.2 kg)    GENERAL:alert, no distress and comfortable SKIN: Noted profound dry skin. EYES: normal, Conjunctiva are pink and non-injected, sclera clear OROPHARYNX:no exudate, no erythema and lips, buccal mucosa, and tongue normal  NECK: supple, thyroid normal size, non-tender, without nodularity LYMPH:  no palpable lymphadenopathy in the cervical, axillary or inguinal LUNGS: clear to auscultation and percussion with normal  breathing effort HEART: regular rate & rhythm and no murmurs and no lower extremity edema ABDOMEN:abdomen soft, non-tender and normal bowel sounds Musculoskeletal:no cyanosis of digits and no clubbing  NEURO: alert & oriented x 3 with fluent speech, no focal motor/sensory deficits  LABORATORY DATA:  I have reviewed the data as listed     Component Value Date/Time   NA 136 03/03/2018 1439   NA 138 12/12/2016 1150   K 4.7 03/03/2018 1439   K 4.6 12/12/2016 1150   CL 101 03/03/2018 1439   CL 103 07/27/2012 1259   CO2 30 03/03/2018 1439   CO2 31 (H) 12/12/2016 1150   GLUCOSE 147 (H) 03/03/2018 1439   GLUCOSE 149 (H) 12/12/2016 1150   GLUCOSE 118 (H) 07/27/2012 1259   BUN 35 (H) 03/03/2018 1439   BUN 23.4 12/12/2016 1150   CREATININE 1.69 (H) 03/03/2018 1439   CREATININE 1.7 (H) 12/12/2016 1150   CALCIUM 9.5 03/03/2018 1439   CALCIUM 11.4 (H) 12/12/2016 1150   PROT 6.3 03/03/2018 1439   PROT 6.5  12/12/2016 1150   ALBUMIN 4.0 03/03/2018 1439   ALBUMIN 3.9 12/12/2016 1150   AST 27 03/03/2018 1439   AST 19 12/12/2016 1150   ALT 25 03/03/2018 1439   ALT 10 12/12/2016 1150   ALKPHOS 82 03/03/2018 1439   ALKPHOS 65 12/12/2016 1150   BILITOT 0.4 03/03/2018 1439   BILITOT 0.36 12/12/2016 1150   GFRNONAA 52 (L) 03/04/2016 0528   GFRAA >60 03/04/2016 0528    No results found for: SPEP, UPEP  Lab Results  Component Value Date   WBC 6.0 03/12/2018   NEUTROABS 3.5 03/12/2018   HGB 11.0 (L) 03/12/2018   HCT 37.1 03/12/2018   MCV 89.8 03/12/2018   PLT 180 03/12/2018      Chemistry      Component Value Date/Time   NA 136 03/03/2018 1439   NA 138 12/12/2016 1150   K 4.7 03/03/2018 1439   K 4.6 12/12/2016 1150   CL 101 03/03/2018 1439   CL 103 07/27/2012 1259   CO2 30 03/03/2018 1439   CO2 31 (H) 12/12/2016 1150   BUN 35 (H) 03/03/2018 1439   BUN 23.4 12/12/2016 1150   CREATININE 1.69 (H) 03/03/2018 1439   CREATININE 1.7 (H) 12/12/2016 1150      Component Value  Date/Time   CALCIUM 9.5 03/03/2018 1439   CALCIUM 11.4 (H) 12/12/2016 1150   ALKPHOS 82 03/03/2018 1439   ALKPHOS 65 12/12/2016 1150   AST 27 03/03/2018 1439   AST 19 12/12/2016 1150   ALT 25 03/03/2018 1439   ALT 10 12/12/2016 1150   BILITOT 0.4 03/03/2018 1439   BILITOT 0.36 12/12/2016 1150      I spent 15 minutes counseling the patient face to face. The total time spent in the appointment was 20 minutes and more than 50% was on counseling.   All questions were answered. The patient knows to call the clinic with any problems, questions or concerns. No barriers to learning was detected.    [name], MD 12/19/20193:22 PM

## 2018-03-12 NOTE — Assessment & Plan Note (Signed)
She has profound dry skin I recommend topical emollient cream on a regular basis

## 2018-03-12 NOTE — Assessment & Plan Note (Signed)
She has received B12 injections Her last B12 level was satisfactory She will continue to receive B12 injection

## 2018-03-12 NOTE — Assessment & Plan Note (Signed)
She tolerated injections well  Her primary doctor has given her intravenous iron infusion recently Her hemoglobin is consistently greater than 10, occasionally greater than 11 I will space out her darbepoetin injection to every 6 weeks, with goals to keep hemoglobin greater than 10

## 2018-03-12 NOTE — Telephone Encounter (Signed)
Gave avs and calendar ° °

## 2018-03-20 DIAGNOSIS — N760 Acute vaginitis: Secondary | ICD-10-CM | POA: Diagnosis not present

## 2018-04-21 ENCOUNTER — Other Ambulatory Visit: Payer: Self-pay | Admitting: Endocrinology

## 2018-04-22 ENCOUNTER — Telehealth: Payer: Self-pay | Admitting: Hematology and Oncology

## 2018-04-22 NOTE — Telephone Encounter (Signed)
Called pt and r/s appt per 1/28 sch message.

## 2018-04-23 ENCOUNTER — Inpatient Hospital Stay: Payer: Medicare Other

## 2018-04-23 ENCOUNTER — Ambulatory Visit: Payer: Medicare Other

## 2018-04-24 ENCOUNTER — Other Ambulatory Visit: Payer: Self-pay | Admitting: General Surgery

## 2018-04-24 DIAGNOSIS — N649 Disorder of breast, unspecified: Secondary | ICD-10-CM

## 2018-04-27 ENCOUNTER — Inpatient Hospital Stay: Payer: Medicare Other | Attending: Hematology and Oncology

## 2018-04-27 ENCOUNTER — Inpatient Hospital Stay: Payer: Medicare Other

## 2018-04-27 DIAGNOSIS — D631 Anemia in chronic kidney disease: Secondary | ICD-10-CM

## 2018-04-27 DIAGNOSIS — N183 Chronic kidney disease, stage 3 unspecified: Secondary | ICD-10-CM

## 2018-04-27 DIAGNOSIS — E538 Deficiency of other specified B group vitamins: Secondary | ICD-10-CM | POA: Insufficient documentation

## 2018-04-27 DIAGNOSIS — N189 Chronic kidney disease, unspecified: Secondary | ICD-10-CM

## 2018-04-27 LAB — CBC WITH DIFFERENTIAL/PLATELET
Abs Immature Granulocytes: 0.02 10*3/uL (ref 0.00–0.07)
Basophils Absolute: 0 10*3/uL (ref 0.0–0.1)
Basophils Relative: 0 %
Eosinophils Absolute: 0.5 10*3/uL (ref 0.0–0.5)
Eosinophils Relative: 7 %
HCT: 36.1 % (ref 36.0–46.0)
Hemoglobin: 10.8 g/dL — ABNORMAL LOW (ref 12.0–15.0)
Immature Granulocytes: 0 %
Lymphocytes Relative: 31 %
Lymphs Abs: 1.9 10*3/uL (ref 0.7–4.0)
MCH: 27 pg (ref 26.0–34.0)
MCHC: 29.9 g/dL — ABNORMAL LOW (ref 30.0–36.0)
MCV: 90.3 fL (ref 80.0–100.0)
Monocytes Absolute: 0.4 10*3/uL (ref 0.1–1.0)
Monocytes Relative: 6 %
Neutro Abs: 3.4 10*3/uL (ref 1.7–7.7)
Neutrophils Relative %: 56 %
Platelets: 193 10*3/uL (ref 150–400)
RBC: 4 MIL/uL (ref 3.87–5.11)
RDW: 15.5 % (ref 11.5–15.5)
WBC: 6.2 10*3/uL (ref 4.0–10.5)
nRBC: 0 % (ref 0.0–0.2)

## 2018-04-27 MED ORDER — CYANOCOBALAMIN 1000 MCG/ML IJ SOLN
1000.0000 ug | Freq: Once | INTRAMUSCULAR | Status: AC
  Administered 2018-04-27: 1000 ug via INTRAMUSCULAR

## 2018-04-27 MED ORDER — CYANOCOBALAMIN 1000 MCG/ML IJ SOLN
INTRAMUSCULAR | Status: AC
  Filled 2018-04-27: qty 1

## 2018-04-27 MED ORDER — DARBEPOETIN ALFA 200 MCG/0.4ML IJ SOSY
PREFILLED_SYRINGE | INTRAMUSCULAR | Status: AC
  Filled 2018-04-27: qty 0.4

## 2018-04-27 NOTE — Patient Instructions (Signed)
Cyanocobalamin, Vitamin B12 injection What is this medicine? CYANOCOBALAMIN (sye an oh koe BAL a min) is a man made form of vitamin B12. Vitamin B12 is used in the growth of healthy blood cells, nerve cells, and proteins in the body. It also helps with the metabolism of fats and carbohydrates. This medicine is used to treat people who can not absorb vitamin B12. This medicine may be used for other purposes; ask your health care provider or pharmacist if you have questions. COMMON BRAND NAME(S): B-12 Compliance Kit, B-12 Injection Kit, Cyomin, LA-12, Nutri-Twelve, Physicians EZ Use B-12, Primabalt What should I tell my health care provider before I take this medicine? They need to know if you have any of these conditions: -kidney disease -Leber's disease -megaloblastic anemia -an unusual or allergic reaction to cyanocobalamin, cobalt, other medicines, foods, dyes, or preservatives -pregnant or trying to get pregnant -breast-feeding How should I use this medicine? This medicine is injected into a muscle or deeply under the skin. It is usually given by a health care professional in a clinic or doctor's office. However, your doctor may teach you how to inject yourself. Follow all instructions. Talk to your pediatrician regarding the use of this medicine in children. Special care may be needed. Overdosage: If you think you have taken too much of this medicine contact a poison control center or emergency room at once. NOTE: This medicine is only for you. Do not share this medicine with others. What if I miss a dose? If you are given your dose at a clinic or doctor's office, call to reschedule your appointment. If you give your own injections and you miss a dose, take it as soon as you can. If it is almost time for your next dose, take only that dose. Do not take double or extra doses. What may interact with this medicine? -colchicine -heavy alcohol intake This list may not describe all possible  interactions. Give your health care provider a list of all the medicines, herbs, non-prescription drugs, or dietary supplements you use. Also tell them if you smoke, drink alcohol, or use illegal drugs. Some items may interact with your medicine. What should I watch for while using this medicine? Visit your doctor or health care professional regularly. You may need blood work done while you are taking this medicine. You may need to follow a special diet. Talk to your doctor. Limit your alcohol intake and avoid smoking to get the best benefit. What side effects may I notice from receiving this medicine? Side effects that you should report to your doctor or health care professional as soon as possible: -allergic reactions like skin rash, itching or hives, swelling of the face, lips, or tongue -blue tint to skin -chest tightness, pain -difficulty breathing, wheezing -dizziness -red, swollen painful area on the leg Side effects that usually do not require medical attention (report to your doctor or health care professional if they continue or are bothersome): -diarrhea -headache This list may not describe all possible side effects. Call your doctor for medical advice about side effects. You may report side effects to FDA at 1-800-FDA-1088. Where should I keep my medicine? Keep out of the reach of children. Store at room temperature between 15 and 30 degrees C (59 and 85 degrees F). Protect from light. Throw away any unused medicine after the expiration date. NOTE: This sheet is a summary. It may not cover all possible information. If you have questions about this medicine, talk to your doctor, pharmacist, or   health care provider.  2019 Elsevier/Gold Standard (2007-06-22 22:10:20)  

## 2018-04-28 DIAGNOSIS — M79605 Pain in left leg: Secondary | ICD-10-CM | POA: Insufficient documentation

## 2018-04-30 ENCOUNTER — Other Ambulatory Visit: Payer: Self-pay | Admitting: Endocrinology

## 2018-05-01 ENCOUNTER — Ambulatory Visit
Admission: RE | Admit: 2018-05-01 | Discharge: 2018-05-01 | Disposition: A | Discharge status: Home / Self Care | Payer: Medicare Other | Source: Ambulatory Visit | Attending: General Surgery | Admitting: General Surgery

## 2018-05-01 DIAGNOSIS — R928 Other abnormal and inconclusive findings on diagnostic imaging of breast: Secondary | ICD-10-CM | POA: Diagnosis not present

## 2018-05-01 DIAGNOSIS — N6489 Other specified disorders of breast: Secondary | ICD-10-CM | POA: Diagnosis not present

## 2018-05-01 DIAGNOSIS — N649 Disorder of breast, unspecified: Secondary | ICD-10-CM

## 2018-05-05 DIAGNOSIS — M722 Plantar fascial fibromatosis: Secondary | ICD-10-CM | POA: Diagnosis not present

## 2018-05-05 DIAGNOSIS — M79672 Pain in left foot: Secondary | ICD-10-CM | POA: Diagnosis not present

## 2018-05-05 DIAGNOSIS — M79605 Pain in left leg: Secondary | ICD-10-CM | POA: Diagnosis not present

## 2018-05-11 DIAGNOSIS — N184 Chronic kidney disease, stage 4 (severe): Secondary | ICD-10-CM | POA: Diagnosis not present

## 2018-05-11 DIAGNOSIS — N189 Chronic kidney disease, unspecified: Secondary | ICD-10-CM | POA: Diagnosis not present

## 2018-05-19 DIAGNOSIS — M722 Plantar fascial fibromatosis: Secondary | ICD-10-CM | POA: Insufficient documentation

## 2018-05-20 DIAGNOSIS — D631 Anemia in chronic kidney disease: Secondary | ICD-10-CM | POA: Diagnosis not present

## 2018-05-20 DIAGNOSIS — N2581 Secondary hyperparathyroidism of renal origin: Secondary | ICD-10-CM | POA: Diagnosis not present

## 2018-05-20 DIAGNOSIS — I129 Hypertensive chronic kidney disease with stage 1 through stage 4 chronic kidney disease, or unspecified chronic kidney disease: Secondary | ICD-10-CM | POA: Diagnosis not present

## 2018-05-20 DIAGNOSIS — N184 Chronic kidney disease, stage 4 (severe): Secondary | ICD-10-CM | POA: Diagnosis not present

## 2018-05-26 ENCOUNTER — Other Ambulatory Visit: Payer: Self-pay

## 2018-05-26 ENCOUNTER — Telehealth: Payer: Self-pay | Admitting: Endocrinology

## 2018-05-26 DIAGNOSIS — M722 Plantar fascial fibromatosis: Secondary | ICD-10-CM | POA: Diagnosis not present

## 2018-05-26 MED ORDER — GLUCOSE BLOOD VI STRP: ORAL_STRIP | 8 refills | Status: DC

## 2018-05-26 MED ORDER — "INSULIN SYRINGE-NEEDLE U-100 25G X 5/8"" 1 ML MISC": 3 refills | Status: AC

## 2018-05-26 NOTE — Telephone Encounter (Signed)
MEDICATION: ONETOUCH VERIO test strip AND Needles for insulin  PHARMACY:   [facility] [phone] - [place], [place] AT [address] [phone] (Phone) [phone] (Fax     IS THIS A 90 DAY SUPPLY : Yes  IS PATIENT OUT OF MEDICATION: No  IF NOT; HOW MUCH IS LEFT: a few days  LAST APPOINTMENT DATE: @2 /08/2018  NEXT APPOINTMENT DATE:@Visit  date not found  DO WE HAVE YOUR PERMISSION TO LEAVE A DETAILED MESSAGE:No voice mail  OTHER COMMENTS:    **Let patient know to contact pharmacy at the end of the day to make sure medication is ready. **  ** Please notify patient to allow 48-72 hours to process**  **Encourage patient to contact the pharmacy for refills or they can request refills through [facility]**

## 2018-05-26 NOTE — Telephone Encounter (Signed)
Rx sent 

## 2018-05-27 ENCOUNTER — Other Ambulatory Visit: Payer: Self-pay | Admitting: Endocrinology

## 2018-05-29 DIAGNOSIS — M722 Plantar fascial fibromatosis: Secondary | ICD-10-CM | POA: Diagnosis not present

## 2018-06-01 ENCOUNTER — Telehealth: Payer: Self-pay | Admitting: Endocrinology

## 2018-06-01 DIAGNOSIS — M79644 Pain in right finger(s): Secondary | ICD-10-CM | POA: Diagnosis not present

## 2018-06-01 DIAGNOSIS — M65331 Trigger finger, right middle finger: Secondary | ICD-10-CM | POA: Diagnosis not present

## 2018-06-01 DIAGNOSIS — M79645 Pain in left finger(s): Secondary | ICD-10-CM | POA: Diagnosis not present

## 2018-06-01 DIAGNOSIS — M65311 Trigger thumb, right thumb: Secondary | ICD-10-CM | POA: Diagnosis not present

## 2018-06-01 NOTE — Telephone Encounter (Signed)
Clarification: patient requested a refill on Janumet 50-500. Refill was sent to pharmacy on 04/30/2018 with two refills. Called pharmacy and informed them of this. They confirmed that the patient does have two refills and a new Rx is not needed.

## 2018-06-01 NOTE — Telephone Encounter (Signed)
MEDICATION: [place], [place], [place] :    IS PATIENT OUT OF MEDICATION:  yes  IF NOT; HOW MUCH IS LEFT:   LAST APPOINTMENT DATE: @3 /06/2018  NEXT APPOINTMENT DATE:@Visit  date not found  DO WE HAVE YOUR PERMISSION TO LEAVE A DETAILED MESSAGE:  OTHER COMMENTS:    **Let patient know to contact pharmacy at the end of the day to make sure medication is ready. **  ** Please notify patient to allow 48-72 hours to process**  **Encourage patient to contact the pharmacy for refills or they can request refills through [facility]**

## 2018-06-02 DIAGNOSIS — M722 Plantar fascial fibromatosis: Secondary | ICD-10-CM | POA: Diagnosis not present

## 2018-06-04 ENCOUNTER — Other Ambulatory Visit: Payer: Self-pay

## 2018-06-04 ENCOUNTER — Inpatient Hospital Stay: Payer: Medicare Other | Attending: Hematology and Oncology

## 2018-06-04 ENCOUNTER — Inpatient Hospital Stay: Payer: Medicare Other

## 2018-06-04 VITALS — BP 154/64 | HR 60 | Temp 97.7°F | Resp 18

## 2018-06-04 DIAGNOSIS — E538 Deficiency of other specified B group vitamins: Secondary | ICD-10-CM | POA: Insufficient documentation

## 2018-06-04 DIAGNOSIS — N189 Chronic kidney disease, unspecified: Secondary | ICD-10-CM | POA: Diagnosis not present

## 2018-06-04 DIAGNOSIS — N183 Chronic kidney disease, stage 3 unspecified: Secondary | ICD-10-CM

## 2018-06-04 DIAGNOSIS — D631 Anemia in chronic kidney disease: Secondary | ICD-10-CM

## 2018-06-04 LAB — CBC WITH DIFFERENTIAL/PLATELET
Abs Immature Granulocytes: 0.02 10*3/uL (ref 0.00–0.07)
Basophils Absolute: 0 10*3/uL (ref 0.0–0.1)
Basophils Relative: 0 %
Eosinophils Absolute: 0.2 10*3/uL (ref 0.0–0.5)
Eosinophils Relative: 3 %
HCT: 32.1 % — ABNORMAL LOW (ref 36.0–46.0)
Hemoglobin: 9.8 g/dL — ABNORMAL LOW (ref 12.0–15.0)
Immature Granulocytes: 0 %
Lymphocytes Relative: 27 %
Lymphs Abs: 2.1 10*3/uL (ref 0.7–4.0)
MCH: 27.5 pg (ref 26.0–34.0)
MCHC: 30.5 g/dL (ref 30.0–36.0)
MCV: 89.9 fL (ref 80.0–100.0)
Monocytes Absolute: 0.5 10*3/uL (ref 0.1–1.0)
Monocytes Relative: 6 %
Neutro Abs: 5 10*3/uL (ref 1.7–7.7)
Neutrophils Relative %: 64 %
Platelets: 178 10*3/uL (ref 150–400)
RBC: 3.57 MIL/uL — ABNORMAL LOW (ref 3.87–5.11)
RDW: 14.8 % (ref 11.5–15.5)
WBC: 7.7 10*3/uL (ref 4.0–10.5)
nRBC: 0 % (ref 0.0–0.2)

## 2018-06-04 MED ORDER — CYANOCOBALAMIN 1000 MCG/ML IJ SOLN
1000.0000 ug | Freq: Once | INTRAMUSCULAR | Status: AC
  Administered 2018-06-04: 1000 ug via INTRAMUSCULAR

## 2018-06-04 MED ORDER — DARBEPOETIN ALFA 200 MCG/0.4ML IJ SOSY
PREFILLED_SYRINGE | INTRAMUSCULAR | Status: AC
  Filled 2018-06-04: qty 0.4

## 2018-06-04 MED ORDER — DARBEPOETIN ALFA 200 MCG/0.4ML IJ SOSY
200.0000 ug | PREFILLED_SYRINGE | Freq: Once | INTRAMUSCULAR | Status: AC
  Administered 2018-06-04: 200 ug via SUBCUTANEOUS

## 2018-06-04 MED ORDER — CYANOCOBALAMIN 1000 MCG/ML IJ SOLN
INTRAMUSCULAR | Status: AC
  Filled 2018-06-04: qty 1

## 2018-06-04 NOTE — Patient Instructions (Signed)
Cyanocobalamin, Pyridoxine, and Folate What is this medicine? A multivitamin containing folic acid, vitamin B6, and vitamin B12. This medicine may be used for other purposes; ask your health care provider or pharmacist if you have questions. COMMON BRAND NAME(S): AllanFol RX, AllanTex, Av-Vite FB, B Complex with Folic Acid, ComBgen, FaBB, Folamin, Folastin, [place], [place], [place], [place], [place], [employer], [place] RX 2.2, [place], [place] 2.2, Foltabs 800, Foltx, Homocysteine Formula, Niva-Fol, NuFol, TL [employer], Virt-Gard, Virt-Vite, Virt-Vite [place], Vita-Respa What should I tell my health care provider before I take this medicine? They need to know if you have any of these conditions: -bleeding or clotting disorder -history of anemia of any type -other chronic health condition -an unusual or allergic reaction to vitamins, other medicines, foods, dyes, or preservatives -pregnant or trying to get pregnant -breast-feeding How should I use this medicine? Take by mouth with a glass of water. May take with food. Follow the directions on the prescription label. It is usually given once a day. Do not take your medicine more often than directed. Contact your pediatrician regarding the use of this medicine in children. Special care may be needed. Overdosage: If you think you have taken too much of this medicine contact a poison control center or emergency room at once. NOTE: This medicine is only for you. Do not share this medicine with others. What if I miss a dose? If you miss a dose, take it as soon as you can. If it is almost time for your next dose, take only that dose. Do not take double or extra doses. What may interact with this medicine? -levodopa This list may not describe all possible interactions. Give your health care provider a list of all the medicines, herbs, non-prescription drugs, or dietary supplements you use. Also tell them if you smoke, drink alcohol, or use illegal drugs.  Some items may interact with your medicine. What should I watch for while using this medicine? See your health care professional for regular checks on your progress. Remember that vitamin supplements do not replace the need for good nutrition from a balanced diet. What side effects may I notice from receiving this medicine? Side effects that you should report to your doctor or health care professional as soon as possible: -allergic reaction such as skin rash or difficulty breathing -vomiting Side effects that usually do not require medical attention (report to your doctor or health care professional if they continue or are bothersome): -nausea -stomach upset This list may not describe all possible side effects. Call your doctor for medical advice about side effects. You may report side effects to FDA at 1-800-FDA-1088. Where should I keep my medicine? Keep out of the reach of children. Most vitamins should be stored at controlled room temperature. Check your specific product directions. Protect from heat and moisture. Throw away any unused medicine after the expiration date. NOTE: This sheet is a summary. It may not cover all possible information. If you have questions about this medicine, talk to your doctor, pharmacist, or health care provider.  2019 Elsevier/Gold Standard (2007-05-02 00:59:55) Darbepoetin Alfa injection What is this medicine? DARBEPOETIN ALFA (dar be POE e tin AL fa) helps your body make more red blood cells. It is used to treat anemia caused by chronic kidney failure and chemotherapy. This medicine may be used for other purposes; ask your health care provider or pharmacist if you have questions. COMMON BRAND NAME(S): Aranesp What should I tell my health care provider before I take this medicine?  They need to know if you have any of these conditions: -blood clotting disorders or history of blood clots -cancer patient not on chemotherapy -cystic fibrosis -heart disease,  such as angina, heart failure, or a history of a heart attack -hemoglobin level of 12 g/dL or greater -high blood pressure -low levels of folate, iron, or vitamin B12 -seizures -an unusual or allergic reaction to darbepoetin, erythropoietin, albumin, hamster proteins, latex, other medicines, foods, dyes, or preservatives -pregnant or trying to get pregnant -breast-feeding How should I use this medicine? This medicine is for injection into a vein or under the skin. It is usually given by a health care professional in a hospital or clinic setting. If you get this medicine at home, you will be taught how to prepare and give this medicine. Use exactly as directed. Take your medicine at regular intervals. Do not take your medicine more often than directed. It is important that you put your used needles and syringes in a special sharps container. Do not put them in a trash can. If you do not have a sharps container, call your pharmacist or healthcare provider to get one. A special MedGuide will be given to you by the pharmacist with each prescription and refill. Be sure to read this information carefully each time. Talk to your pediatrician regarding the use of this medicine in children. While this medicine may be used in children as young as 69 month of age for selected conditions, precautions do apply. Overdosage: If you think you have taken too much of this medicine contact a poison control center or emergency room at once. NOTE: This medicine is only for you. Do not share this medicine with others. What if I miss a dose? If you miss a dose, take it as soon as you can. If it is almost time for your next dose, take only that dose. Do not take double or extra doses. What may interact with this medicine? Do not take this medicine with any of the following medications: -epoetin alfa This list may not describe all possible interactions. Give your health care provider a list of all the medicines, herbs,  non-prescription drugs, or dietary supplements you use. Also tell them if you smoke, drink alcohol, or use illegal drugs. Some items may interact with your medicine. What should I watch for while using this medicine? Your condition will be monitored carefully while you are receiving this medicine. You may need blood work done while you are taking this medicine. This medicine may cause a decrease in vitamin B6. You should make sure that you get enough vitamin B6 while you are taking this medicine. Discuss the foods you eat and the vitamins you take with your health care professional. What side effects may I notice from receiving this medicine? Side effects that you should report to your doctor or health care professional as soon as possible: -allergic reactions like skin rash, itching or hives, swelling of the face, lips, or tongue -breathing problems -changes in vision -chest pain -confusion, trouble speaking or understanding -feeling faint or lightheaded, falls -high blood pressure -muscle aches or pains -pain, swelling, warmth in the leg -rapid weight gain -severe headaches -sudden numbness or weakness of the face, arm or leg -trouble walking, dizziness, loss of balance or coordination -seizures (convulsions) -swelling of the ankles, feet, hands -unusually weak or tired Side effects that usually do not require medical attention (report to your doctor or health care professional if they continue or are bothersome): -diarrhea -fever, chills (  flu-like symptoms) -headaches -nausea, vomiting -redness, stinging, or swelling at site where injected This list may not describe all possible side effects. Call your doctor for medical advice about side effects. You may report side effects to FDA at 1-800-FDA-1088. Where should I keep my medicine? Keep out of the reach of children. Store in a refrigerator between 2 and 8 degrees C (36 and 46 degrees F). Do not freeze. Do not shake. Throw away any  unused portion if using a single-dose vial. Throw away any unused medicine after the expiration date. NOTE: This sheet is a summary. It may not cover all possible information. If you have questions about this medicine, talk to your doctor, pharmacist, or health care provider.  2019 Elsevier/Gold Standard (2017-03-26 16:44:20)

## 2018-06-10 DIAGNOSIS — E1122 Type 2 diabetes mellitus with diabetic chronic kidney disease: Secondary | ICD-10-CM | POA: Diagnosis not present

## 2018-06-10 DIAGNOSIS — D649 Anemia, unspecified: Secondary | ICD-10-CM | POA: Diagnosis not present

## 2018-06-10 DIAGNOSIS — I10 Essential (primary) hypertension: Secondary | ICD-10-CM | POA: Diagnosis not present

## 2018-06-10 DIAGNOSIS — I252 Old myocardial infarction: Secondary | ICD-10-CM | POA: Diagnosis not present

## 2018-06-10 DIAGNOSIS — E785 Hyperlipidemia, unspecified: Secondary | ICD-10-CM | POA: Diagnosis not present

## 2018-06-10 DIAGNOSIS — E559 Vitamin D deficiency, unspecified: Secondary | ICD-10-CM | POA: Diagnosis not present

## 2018-06-10 DIAGNOSIS — N189 Chronic kidney disease, unspecified: Secondary | ICD-10-CM | POA: Diagnosis not present

## 2018-06-10 DIAGNOSIS — I25111 Atherosclerotic heart disease of native coronary artery with angina pectoris with documented spasm: Secondary | ICD-10-CM | POA: Diagnosis not present

## 2018-06-10 DIAGNOSIS — E039 Hypothyroidism, unspecified: Secondary | ICD-10-CM | POA: Diagnosis not present

## 2018-06-11 ENCOUNTER — Other Ambulatory Visit: Payer: Self-pay | Admitting: Endocrinology

## 2018-06-11 DIAGNOSIS — M722 Plantar fascial fibromatosis: Secondary | ICD-10-CM | POA: Diagnosis not present

## 2018-06-15 ENCOUNTER — Other Ambulatory Visit (INDEPENDENT_AMBULATORY_CARE_PROVIDER_SITE_OTHER): Payer: Medicare Other

## 2018-06-15 ENCOUNTER — Ambulatory Visit: Payer: Medicare Other | Admitting: Endocrinology

## 2018-06-15 ENCOUNTER — Other Ambulatory Visit: Payer: Self-pay

## 2018-06-15 DIAGNOSIS — E1165 Type 2 diabetes mellitus with hyperglycemia: Secondary | ICD-10-CM | POA: Diagnosis not present

## 2018-06-15 LAB — BASIC METABOLIC PANEL
BUN: 26 mg/dL — ABNORMAL HIGH (ref 6–23)
CO2: 29 mEq/L (ref 19–32)
Calcium: 10.5 mg/dL (ref 8.4–10.5)
Chloride: 102 mEq/L (ref 96–112)
Creatinine, Ser: 1.59 mg/dL — ABNORMAL HIGH (ref 0.40–1.20)
GFR: 31.64 mL/min — ABNORMAL LOW (ref >60.00)
Glucose, Bld: 116 mg/dL — ABNORMAL HIGH (ref 70–99)
Potassium: 4.4 mEq/L (ref 3.5–5.1)
Sodium: 138 mEq/L (ref 135–145)

## 2018-06-16 LAB — FRUCTOSAMINE: Fructosamine: 293 umol/L — ABNORMAL HIGH (ref 0–285)

## 2018-06-18 ENCOUNTER — Other Ambulatory Visit: Payer: Self-pay

## 2018-06-18 ENCOUNTER — Encounter: Payer: Self-pay | Admitting: Endocrinology

## 2018-06-18 ENCOUNTER — Ambulatory Visit (INDEPENDENT_AMBULATORY_CARE_PROVIDER_SITE_OTHER): Payer: Medicare Other | Admitting: Endocrinology

## 2018-06-18 VITALS — BP 140/60 | HR 60 | Temp 97.7°F | Ht 61.0 in | Wt 127.4 lb

## 2018-06-18 DIAGNOSIS — E7800 Pure hypercholesterolemia, unspecified: Secondary | ICD-10-CM | POA: Diagnosis not present

## 2018-06-18 DIAGNOSIS — E039 Hypothyroidism, unspecified: Secondary | ICD-10-CM | POA: Diagnosis not present

## 2018-06-18 DIAGNOSIS — E1165 Type 2 diabetes mellitus with hyperglycemia: Secondary | ICD-10-CM

## 2018-06-18 NOTE — Patient Instructions (Signed)
Take Prandin just before or 30 min before meals

## 2018-06-18 NOTE — Progress Notes (Signed)
Patient ID: Abigail Hoover, female   DOB: [birth year], 73 y.o.   MRN: [id number]    Reason for Appointment:  follow-up of various issues and high calcium  History of Present Illness   Diagnosis: Type 2 DIABETES MELITUS, long-standing  She has had mild diabetes for several years but has required multiple drugs for control Usually has had postprandial hyperglycemia, this has been better controlled with adding Glyset to her main meals Has never been on insulin or a GLP-1 drug Actos had been stopped previously because of tendency to edema  Recent history:   Oral hypoglycemic drugs:  Precose 50 mg before breakfast and lunch, 100 mg dinner, Prandin 1 mg before breakfast, 1 mg at dinnertime  Her A1c is usually lower than expected, last 6.3 compared to 5.7 Last fructosamine 269 and is now 293  Current management, problems identified and blood sugar patterns:  She was started on Prandin because of higher blood sugars after meals and need to reduce her Janumet with renal dysfunction  However her blood sugars were as high as 242 after dinner with the 0.5 mg dose and she is now taking 1 mg twice daily  She was also told to continue low-dose Janumet, 50/500 with her renal function being relatively better  She still complains that her blood sugars are too high especially after dinner but as before they are inconsistent  On an average her blood sugars are lower than before  She now states that she may sometimes forget to take her Prandin before eating and will take it after  Also not clear if she is taking her acarbose consistently before the meal  Even with eating a light breakfast sometimes her blood sugars may be relatively higher midday but has only 1 reading  Usually does not have enough protein at meals except lentils and does not like to add dairy products like yogurt  She had an asymptomatic reading of 66 before dinner about 4 weeks ago  Side effects from medications: None  Proper timing of medications in relation to meals: Yes.          Monitors blood glucose: 1 time a day or less .    Glucometer: One Touch        Blood Glucose readings from download    PRE-MEAL Fasting Lunch Dinner Bedtime Overall  Glucose range:  72-181  182  66    Mean/median:  118     138   POST-MEAL PC Breakfast PC Lunch PC Dinner  Glucose range:   69  75-232  Mean/median:    165   Previous readings:   PRE-MEAL Fasting Lunch Dinner Bedtime Overall  Glucose range:  79-166      Mean/median:  103     168   POST-MEAL PC Breakfast PC Lunch PC Dinner  Glucose range:  198-225   105-242  Mean/median:    187      Meals: 2- 3 meals per day.   Generally eating small breakfast and lunch but with only little protein, is vegetarian   EXERCISE:  None, only activity is housework            Dietician visit: Most recent: None    Wt Readings from Last 3 Encounters:  06/18/18 127 lb 6.4 oz (57.8 kg)  03/12/18 124 lb (56.2 kg)  03/06/18 117 lb (53.1 kg)   Lab Results  Component Value Date   HGBA1C 6.3 03/03/2018   HGBA1C 5.7 10/21/2017   HGBA1C 6.1 05/27/2017  Lab Results  Component Value Date   MICROALBUR 1.0 10/21/2017   LDLCALC 96 01/08/2018   CREATININE 1.59 (H) 06/15/2018      Lab Results  Component Value Date   FRUCTOSAMINE 293 (H) 06/15/2018   FRUCTOSAMINE 269 01/08/2018   FRUCTOSAMINE 266 07/26/2016   FRUCTOSAMINE 230 12/07/2014     OTHER problems Addressed today are in review of systems:    Allergies as of 06/18/2018      Reactions   Atorvastatin Rash   Ezetimibe Itching      Medication List       Accurate as of June 18, 2018  2:36 PM. Always use your most recent med list.        acarbose 50 MG tablet Commonly known as:  PRECOSE TAKE 1 TABLET AT BREAKFAST, 1 TABLET AT LUNCH AND 1 & 1/2 TABLET AT BEDTIME   aspirin EC 81 MG tablet Take 81 mg by mouth daily. Take 1 tablet by mouth once daily.   Dexilant 60 MG capsule Generic drug:   dexlansoprazole Take 60 mg by mouth daily. Take 1 tablet by mouth once daily.   furosemide 40 MG tablet Commonly known as:  LASIX Take 40 mg by mouth. Take 1 tablet by mouth three times per week.   gabapentin 300 MG capsule Commonly known as:  NEURONTIN Take 300 mg by mouth 4 (four) times daily.   glucose blood test strip Commonly known as:  OneTouch Verio USE AS INSTRUCTED TO CHECK BLOOD SUGAR ONCE A DAY ALTERNATING BETWEEN FASTING GLUCOSE CLECKS AND AFTER MEAL CHECKS.   hydrocortisone 2.5 % ointment APPLY TO AFFECED ARES(S) TOPICALLY TWICE DAILY AS NEEDED   ibandronate 150 MG tablet Commonly known as:  BONIVA Take 150 mg by mouth every 30 (thirty) days. Take 1 tablet by mouth once per month.   Insulin Syringe-Needle U-100 25G X 5/8" 1 ML Misc USE DAILY TO INJECT INSULIN   isosorbide mononitrate 120 MG 24 hr tablet Commonly known as:  IMDUR Take 120 mg by mouth daily. Take 1 tablet by mouth once daily.   Janumet 50-500 MG tablet Generic drug:  sitaGLIPtin-metformin TAKE 1 TABLET BY MOUTH DAILY WITH BREAKFAST.   metoprolol succinate 50 MG 24 hr tablet Commonly known as:  TOPROL-XL Take 1 tablet (50 mg total) by mouth daily. Take with or immediately following a meal.   Myrbetriq 50 MG Tb24 tablet Generic drug:  mirabegron ER Take 50 mg by mouth daily.   nitroGLYCERIN 0.4 MG SL tablet Commonly known as:  NITROSTAT Place 1 tablet (0.4 mg total) under the tongue every 5 (five) minutes x 3 doses as needed for chest pain.   ONE TOUCH SURESOFT Misc Use to check blood sugars   onetouch ultrasoft lancets Use to test blood sugar once daily   pentosan polysulfate 100 MG capsule Commonly known as:  ELMIRON Take 100 mg by mouth 3 (three) times daily before meals.   pravastatin 20 MG tablet Commonly known as:  PRAVACHOL TAKE ONE TABLET BY MOUTH ONCE DAILY   repaglinide 1 MG tablet Commonly known as:  PRANDIN TAKE 1 TABLET (1 MG TOTAL) BY MOUTH 2 (TWO) TIMES DAILY BEFORE  A MEAL.   Synthroid 75 MCG tablet Generic drug:  levothyroxine TAKE ONE TABLET BY MOUTH ONCE DAILY   triamcinolone ointment 0.1 % Commonly known as:  KENALOG APPLY THIN LAYER ON THE SKIN TWICE A DAY FOR 3 WEEKS       Allergies:  Allergies  Allergen Reactions  . Atorvastatin  Rash  . Ezetimibe Itching    Past Medical History:  Diagnosis Date  . 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 Anxiety   . Arthritis   . Bladder irritation   . Diabetes mellitus   . GERD (gastroesophageal reflux disease)   . Hypercholesterolemia   . Hypertension   . Hypothyroidism   . Iron deficiency anemia, unspecified   . Psoriasis   . Transfusion history 03/05/2016   for postoperative (ORIF) anemia superimposed on chronic anemia    Past Surgical History:  Procedure Laterality Date  . ABDOMINAL HYSTERECTOMY     TAH  . BREAST BIOPSY     Left breast biopsy benign lesion  . BREAST BIOPSY  03/11/2012   Procedure: BREAST BIOPSY;  Surgeon: Odis Hollingshead, MD;  Location: [place];  Service: General;  Laterality: Left;  remove left breast mass  . BREAST EXCISIONAL BIOPSY Left   . CARDIAC CATHETERIZATION N/A 01/02/2015   Procedure: Left Heart Cath and Coronary Angiography;  Surgeon: Charolette Forward, MD;  Location: [place] [facility];  Service: Cardiovascular;  Laterality: N/A;  . CATARACT EXTRACTION    . CERVICAL DISC SURGERY    . COLONOSCOPY  2013   neg. next 2023.   Marland Kitchen EYE SURGERY    . LEFT HIP SURGERY    . TOTAL HIP ARTHROPLASTY Left 03/02/2016   Procedure: TOTAL HIP ARTHROPLASTY ANTERIOR APPROACH;  Surgeon: Rod Can, MD;  Location: [place];  Service: Orthopedics;  Laterality: Left;    Family History  Problem Relation Age of Onset  . Heart disease Mother        heart attack  . Stroke Father        brain hemorrhage  . Diabetes Brother   . Cancer Brother   . Breast cancer Sister   . Cancer Sister        breast cancer    Social History:  reports that she has never smoked. She has  never used smokeless tobacco. She reports that she does not drink alcohol or use drugs.  Review of Systems:     HYPERTENSION: Mild, this is well-controlled with metoprolol 50 mg only Followed by cardiologist  RENAL dysfunction has been variable and followed by nephrologist every 3 months No hyperkalemia recently  She takes Lasix for  edema  Lab Results  Component Value Date   CREATININE 1.59 (H) 06/15/2018   CREATININE 1.69 (H) 03/03/2018   CREATININE 1.87 (H) 01/08/2018     HYPERLIPIDEMIA: The lipid abnormality consists of elevated LDL which is mild and well controlled with pravastatin 20 mg LDL consistently below 100 History of possible rash from Lipitor  Lab Results  Component Value Date   CHOL 174 01/08/2018   HDL 58.40 01/08/2018   LDLCALC 96 01/08/2018   TRIG 97.0 01/08/2018   CHOLHDL 3 01/08/2018    She has a long history of primary HYPOTHYROIDISM   She takes SYNTHROID daily before breakfast  TSH has been consistently normal with taking 75 mcg daily    Lab Results  Component Value Date   TSH 3.36 03/03/2018   TSH 1.64 10/21/2017   TSH 0.68 12/20/2016   FREET4 1.35 12/20/2016   FREET4 1.07 04/18/2016   FREET4 1.37 07/13/2015     Has been taking gabapentin because of bladder issues and recommended by a urologist a few years ago    Diabetic foot exam in 12/19: Normal monofilament sensation   OSTEOPENIA: Her T score Previously done by gynecologist was -2.1, currently on  Boniva started in 01/2014  Last bone density was -2.6 in 2/18, this was ordered by her gastroenterologist    Examination:   BP 140/60 (BP Location: Left Arm, Patient Position: Sitting, Cuff Size: Normal)   Pulse 60   Temp 97.7 F (36.5 C) (Oral)   Ht 5\' 1"  (1.549 m)   Wt 127 lb 6.4 oz (57.8 kg)   SpO2 98%   BMI 24.07 kg/m   Body mass index is 24.07 kg/m.    1+ right ankle edema present, minimal on the left  ASSESSMENT/ PLAN:    Diabetes type 2, Nonobese   See history  of present illness for detailed discussion of current diabetes management, blood sugar patterns and problems identified  She has tendency to postprandial hyperglycemia Fructosamine is 293, higher than before However her blood sugars appear to be relatively better than before with increasing her Prandin dose to 1 mg Discussed that her main difficulty is not remembering to take her Prandin before eating and she can delay till after the meal which is less effective   She will try to make sure she takes her Prandin before eating both morning and evening Also she did can take it with her acarbose a few minutes before eating consistently Reassured her that since her average blood sugar is only about 160 after eating in the evening this is adequate She can also try taking her Janumet with dinner which may help also with her evening readings  There are no Patient Instructions on file for this visit.    Elayne Snare 06/18/2018, 2:36 PM

## 2018-06-19 ENCOUNTER — Ambulatory Visit: Payer: Medicare Other | Admitting: Endocrinology

## 2018-06-30 ENCOUNTER — Other Ambulatory Visit: Payer: Self-pay | Admitting: Endocrinology

## 2018-07-11 ENCOUNTER — Other Ambulatory Visit: Payer: Self-pay | Admitting: Endocrinology

## 2018-07-14 ENCOUNTER — Telehealth: Payer: Self-pay | Admitting: Endocrinology

## 2018-07-14 NOTE — Telephone Encounter (Signed)
Patient has called in regards to her blood sugars running between 180-230 and it has her worried.  Please Advise, Thanks

## 2018-07-15 NOTE — Telephone Encounter (Signed)
Patient is calling again in regards to her blood sugars being really high and request a call.  Please Advise, Thanks

## 2018-07-15 NOTE — Telephone Encounter (Signed)
Pt will bring her meter to office tomorrow for download.

## 2018-07-16 ENCOUNTER — Inpatient Hospital Stay: Payer: Medicare Other

## 2018-07-16 ENCOUNTER — Inpatient Hospital Stay: Payer: Medicare Other | Attending: Hematology and Oncology

## 2018-07-21 ENCOUNTER — Telehealth: Payer: Self-pay | Admitting: Endocrinology

## 2018-07-21 NOTE — Telephone Encounter (Signed)
Patient concerned about high sugars at night after dinner.  Currently medium blood sugar 168 after dinner Is taking Prandin before eating.  Discussed that since she has tendency to occasional hypoglycemia early morning will not increase her Prandin and insulin would be difficult to manage.  She does need to increase her protein at dinnertime and start eating yogurt

## 2018-08-03 ENCOUNTER — Other Ambulatory Visit: Payer: Self-pay | Admitting: Endocrinology

## 2018-08-04 DIAGNOSIS — R399 Unspecified symptoms and signs involving the genitourinary system: Secondary | ICD-10-CM | POA: Diagnosis not present

## 2018-08-04 DIAGNOSIS — N184 Chronic kidney disease, stage 4 (severe): Secondary | ICD-10-CM | POA: Diagnosis not present

## 2018-08-06 ENCOUNTER — Other Ambulatory Visit: Payer: Self-pay | Admitting: Endocrinology

## 2018-08-12 DIAGNOSIS — D631 Anemia in chronic kidney disease: Secondary | ICD-10-CM | POA: Diagnosis not present

## 2018-08-12 DIAGNOSIS — N2581 Secondary hyperparathyroidism of renal origin: Secondary | ICD-10-CM | POA: Diagnosis not present

## 2018-08-12 DIAGNOSIS — N183 Chronic kidney disease, stage 3 (moderate): Secondary | ICD-10-CM | POA: Diagnosis not present

## 2018-08-12 DIAGNOSIS — I129 Hypertensive chronic kidney disease with stage 1 through stage 4 chronic kidney disease, or unspecified chronic kidney disease: Secondary | ICD-10-CM | POA: Diagnosis not present

## 2018-08-27 ENCOUNTER — Other Ambulatory Visit: Payer: Self-pay

## 2018-08-27 ENCOUNTER — Inpatient Hospital Stay (HOSPITAL_BASED_OUTPATIENT_CLINIC_OR_DEPARTMENT_OTHER): Payer: Medicare Other | Admitting: Hematology and Oncology

## 2018-08-27 ENCOUNTER — Telehealth: Payer: Self-pay | Admitting: Endocrinology

## 2018-08-27 ENCOUNTER — Encounter: Payer: Self-pay | Admitting: Hematology and Oncology

## 2018-08-27 ENCOUNTER — Telehealth: Payer: Self-pay

## 2018-08-27 ENCOUNTER — Inpatient Hospital Stay: Payer: Medicare Other

## 2018-08-27 ENCOUNTER — Inpatient Hospital Stay: Payer: Medicare Other | Attending: Hematology and Oncology

## 2018-08-27 DIAGNOSIS — N183 Chronic kidney disease, stage 3 unspecified: Secondary | ICD-10-CM

## 2018-08-27 DIAGNOSIS — G8929 Other chronic pain: Secondary | ICD-10-CM

## 2018-08-27 DIAGNOSIS — D631 Anemia in chronic kidney disease: Secondary | ICD-10-CM

## 2018-08-27 DIAGNOSIS — Z803 Family history of malignant neoplasm of breast: Secondary | ICD-10-CM

## 2018-08-27 DIAGNOSIS — E538 Deficiency of other specified B group vitamins: Secondary | ICD-10-CM | POA: Insufficient documentation

## 2018-08-27 DIAGNOSIS — N189 Chronic kidney disease, unspecified: Secondary | ICD-10-CM

## 2018-08-27 DIAGNOSIS — M79644 Pain in right finger(s): Secondary | ICD-10-CM

## 2018-08-27 DIAGNOSIS — Z9189 Other specified personal risk factors, not elsewhere classified: Secondary | ICD-10-CM

## 2018-08-27 LAB — CBC WITH DIFFERENTIAL/PLATELET
Abs Immature Granulocytes: 0.01 10*3/uL (ref 0.00–0.07)
Basophils Absolute: 0 10*3/uL (ref 0.0–0.1)
Basophils Relative: 0 %
Eosinophils Absolute: 0.4 10*3/uL (ref 0.0–0.5)
Eosinophils Relative: 6 %
HCT: 32 % — ABNORMAL LOW (ref 36.0–46.0)
Hemoglobin: 9.6 g/dL — ABNORMAL LOW (ref 12.0–15.0)
Immature Granulocytes: 0 %
Lymphocytes Relative: 25 %
Lymphs Abs: 1.7 10*3/uL (ref 0.7–4.0)
MCH: 27.7 pg (ref 26.0–34.0)
MCHC: 30 g/dL (ref 30.0–36.0)
MCV: 92.2 fL (ref 80.0–100.0)
Monocytes Absolute: 0.4 10*3/uL (ref 0.1–1.0)
Monocytes Relative: 6 %
Neutro Abs: 4.3 10*3/uL (ref 1.7–7.7)
Neutrophils Relative %: 63 %
Platelets: 255 10*3/uL (ref 150–400)
RBC: 3.47 MIL/uL — ABNORMAL LOW (ref 3.87–5.11)
RDW: 13.2 % (ref 11.5–15.5)
WBC: 6.8 10*3/uL (ref 4.0–10.5)
nRBC: 0 % (ref 0.0–0.2)

## 2018-08-27 MED ORDER — DARBEPOETIN ALFA 200 MCG/0.4ML IJ SOSY
PREFILLED_SYRINGE | INTRAMUSCULAR | Status: AC
  Filled 2018-08-27: qty 0.4

## 2018-08-27 MED ORDER — DARBEPOETIN ALFA 200 MCG/0.4ML IJ SOSY
200.0000 ug | PREFILLED_SYRINGE | Freq: Once | INTRAMUSCULAR | Status: AC
  Administered 2018-08-27: 200 ug via SUBCUTANEOUS

## 2018-08-27 MED ORDER — CYANOCOBALAMIN 1000 MCG/ML IJ SOLN
INTRAMUSCULAR | Status: AC
  Filled 2018-08-27: qty 1

## 2018-08-27 MED ORDER — CYANOCOBALAMIN 1000 MCG/ML IJ SOLN
1000.0000 ug | Freq: Once | INTRAMUSCULAR | Status: AC
  Administered 2018-08-27: 1000 ug via INTRAMUSCULAR

## 2018-08-27 NOTE — Assessment & Plan Note (Signed)
She had negative mammogram in September. She will continue close follow-up Examination today is benign The dry crusting on her nipple has resolved

## 2018-08-27 NOTE — Assessment & Plan Note (Signed)
We discussed some of the risks, benefits, and alternatives of erythropoietin stimulating agents such as Procrit or Aranesp.  She tolerated injections well  I will space out her darbepoetin injection to every other month The goal is to keep hemoglobin at greater than 11

## 2018-08-27 NOTE — Telephone Encounter (Signed)
Dr.Gorsuch office reached out to inform they saw Abigail Hoover for an appointment today and she was complaining about right thumb pain.   Please Advise, Thanks

## 2018-08-27 NOTE — Assessment & Plan Note (Signed)
She has tendinopathy on exam. I will alert her primary care doctor about this.  She has been seen by a hand surgeon in the past and she might need return appointment to be seen back.

## 2018-08-27 NOTE — Progress Notes (Signed)
[facility] OFFICE PROGRESS NOTE  Elayne Snare, MD  ASSESSMENT & PLAN:  Anemia in chronic kidney disease We discussed some of the risks, benefits, and alternatives of erythropoietin stimulating agents such as Procrit or Aranesp.  She tolerated injections well  I will space out her darbepoetin injection to every other month The goal is to keep hemoglobin at greater than 11  At high risk for breast cancer She had negative mammogram in September. She will continue close follow-up Examination today is benign The dry crusting on her nipple has resolved  Chronic pain of right thumb She has tendinopathy on exam. I will alert her primary care doctor about this.  She has been seen by a hand surgeon in the past and she might need return appointment to be seen back.   No orders of the defined types were placed in this encounter.   INTERVAL HISTORY: Abigail Hoover 72 y.o. female returns for further follow-up She has missed appointment recently She is being followed for anemia chronic renal disease and family history of breast cancer She feels well The nipple changes on her left breast has resolved Her main concern is pain in the right thumb region Previously, she have seen a hand surgeon in the past She denies recent bleeding No recent infection, fever or chills She is not symptomatic from anemia  SUMMARY OF HEMATOLOGIC HISTORY:  This is a patient that has been followed here since 2011 for chronic anemia In 2011, she had a bone marrow aspirate and biopsy that came back normal. The patient has been receiving Aranesp injection for chronic anemia up until 2014. It was subsequently discontinued due stability of the anemia. Starting 05/27/2014, darbepoetin was resumed at 200 g every other month to keep hemoglobin greater than 10 g. Treatment was discontinued in October 2016 and resumed in September 2017 The patient is subsequently found to have severe vitamin B12 deficiency  and is started on vitamin B12 injection on December 25, 2016 along with resumption of darbepoetin injection every 5 to 6 weeks.  She is also seen due to family history of breast cancer  I have reviewed the past medical history, past surgical history, social history and family history with the patient and they are unchanged from previous note.  ALLERGIES:  is allergic to atorvastatin and ezetimibe.  MEDICATIONS:  Current Outpatient Medications  Medication Sig Dispense Refill  . acarbose (PRECOSE) 50 MG tablet TAKE 1 TABLET AT BREAKFAST, 1 TABLET AT LUNCH AND 1 & 1/2 TABLET AT BEDTIME 105 tablet 0  . aspirin EC 81 MG tablet Take 81 mg by mouth daily. Take 1 tablet by mouth once daily.    Marland Kitchen DEXILANT 60 MG capsule Take 60 mg by mouth daily. Take 1 tablet by mouth once daily.    . furosemide (LASIX) 40 MG tablet Take 40 mg by mouth. Take 1 tablet by mouth three times per week.    . gabapentin (NEURONTIN) 300 MG capsule Take 300 mg by mouth 4 (four) times daily.    Marland Kitchen glucose blood (ONETOUCH VERIO) test strip USE AS INSTRUCTED TO CHECK BLOOD SUGAR ONCE A DAY ALTERNATING BETWEEN FASTING GLUCOSE CLECKS AND AFTER MEAL CHECKS. 100 each 8  . hydrocortisone 2.5 % ointment APPLY TO AFFECED ARES(S) TOPICALLY TWICE DAILY AS NEEDED  3  . ibandronate (BONIVA) 150 MG tablet Take 150 mg by mouth every 30 (thirty) days. Take 1 tablet by mouth once per month.    . Insulin Syringe-Needle U-100 25G X 5/8"  1 ML MISC USE DAILY TO INJECT INSULIN 100 each 3  . isosorbide mononitrate (IMDUR) 120 MG 24 hr tablet Take 120 mg by mouth daily. Take 1 tablet by mouth once daily.    Marland Kitchen JANUMET 50-500 MG tablet TAKE 1 TABLET BY MOUTH DAILY WITH BREAKFAST. 30 tablet 2  . Lancets (ONETOUCH ULTRASOFT) lancets Use to test blood sugar once daily 100 each 8  . Lancets Misc. (ONE TOUCH SURESOFT) MISC Use to check blood sugars 1 each 3  . metoprolol succinate (TOPROL-XL) 50 MG 24 hr tablet Take 1 tablet (50 mg total) by mouth daily. Take  with or immediately following a meal. 30 tablet 3  . MYRBETRIQ 50 MG TB24 tablet Take 50 mg by mouth daily.  3  . nitroGLYCERIN (NITROSTAT) 0.4 MG SL tablet Place 1 tablet (0.4 mg total) under the tongue every 5 (five) minutes x 3 doses as needed for chest pain. 25 tablet 12  . pentosan polysulfate (ELMIRON) 100 MG capsule Take 100 mg by mouth 3 (three) times daily before meals.     . pravastatin (PRAVACHOL) 20 MG tablet TAKE ONE TABLET BY MOUTH ONCE DAILY 90 tablet 0  . repaglinide (PRANDIN) 1 MG tablet TAKE 1 TABLET (1 MG TOTAL) BY MOUTH 2 (TWO) TIMES DAILY BEFORE A MEAL. 60 tablet 3  . SYNTHROID 75 MCG tablet TAKE ONE TABLET BY MOUTH ONCE DAILY 90 tablet 0  . triamcinolone ointment (KENALOG) 0.1 % APPLY THIN LAYER ON THE SKIN TWICE A DAY FOR 3 WEEKS  0   No current facility-administered medications for this visit.      REVIEW OF SYSTEMS:   Constitutional: Denies fevers, chills or night sweats Eyes: Denies blurriness of vision Ears, nose, mouth, throat, and face: Denies mucositis or sore throat Respiratory: Denies cough, dyspnea or wheezes Cardiovascular: Denies palpitation, chest discomfort or lower extremity swelling Gastrointestinal:  Denies nausea, heartburn or change in bowel habits Skin: Denies abnormal skin rashes Lymphatics: Denies new lymphadenopathy or easy bruising Neurological:Denies numbness, tingling or new weaknesses Behavioral/Psych: Mood is stable, no new changes  All other systems were reviewed with the patient and are negative.  PHYSICAL EXAMINATION: ECOG PERFORMANCE STATUS: 1 - Symptomatic but completely ambulatory  Vitals:   08/27/18 1203  BP: (!) 136/55  Pulse: 66  Resp: 18  Temp: 98.1 F (36.7 C)  SpO2: 100%   Filed Weights   08/27/18 1203  Weight: 127 lb 6.4 oz (57.8 kg)    GENERAL:alert, no distress and comfortable SKIN: skin color, texture, turgor are normal, no rashes or significant lesions EYES: normal, Conjunctiva are pink and non-injected,  sclera clear OROPHARYNX:no exudate, no erythema and lips, buccal mucosa, and tongue normal  NECK: supple, thyroid normal size, non-tender, without nodularity LYMPH:  no palpable lymphadenopathy in the cervical, axillary or inguinal LUNGS: clear to auscultation and percussion with normal breathing effort HEART: regular rate & rhythm and no murmurs and no lower extremity edema ABDOMEN:abdomen soft, non-tender and normal bowel sounds Musculoskeletal:no cyanosis of digits and no clubbing.  She has tenderness over her right thumb NEURO: alert & oriented x 3 with fluent speech, no focal motor/sensory deficits Bilateral breast examination were performed.  Well-healed lumpectomy scar on the left breast with no other abnormalities LABORATORY DATA:  I have reviewed the data as listed     Component Value Date/Time   NA 138 06/15/2018 1354   NA 138 12/12/2016 1150   K 4.4 06/15/2018 1354   K 4.6 12/12/2016 1150   CL  102 06/15/2018 1354   CL 103 07/27/2012 1259   CO2 29 06/15/2018 1354   CO2 31 (H) 12/12/2016 1150   GLUCOSE 116 (H) 06/15/2018 1354   GLUCOSE 149 (H) 12/12/2016 1150   GLUCOSE 118 (H) 07/27/2012 1259   BUN 26 (H) 06/15/2018 1354   BUN 23.4 12/12/2016 1150   CREATININE 1.59 (H) 06/15/2018 1354   CREATININE 1.7 (H) 12/12/2016 1150   CALCIUM 10.5 06/15/2018 1354   CALCIUM 11.4 (H) 12/12/2016 1150   PROT 6.3 03/03/2018 1439   PROT 6.5 12/12/2016 1150   ALBUMIN 4.0 03/03/2018 1439   ALBUMIN 3.9 12/12/2016 1150   AST 27 03/03/2018 1439   AST 19 12/12/2016 1150   ALT 25 03/03/2018 1439   ALT 10 12/12/2016 1150   ALKPHOS 82 03/03/2018 1439   ALKPHOS 65 12/12/2016 1150   BILITOT 0.4 03/03/2018 1439   BILITOT 0.36 12/12/2016 1150   GFRNONAA 52 (L) 03/04/2016 0528   GFRAA >60 03/04/2016 0528    No results found for: SPEP, UPEP  Lab Results  Component Value Date   WBC 6.8 08/27/2018   NEUTROABS 4.3 08/27/2018   HGB 9.6 (L) 08/27/2018   HCT 32.0 (L) 08/27/2018   MCV 92.2  08/27/2018   PLT 255 08/27/2018      Chemistry      Component Value Date/Time   NA 138 06/15/2018 1354   NA 138 12/12/2016 1150   K 4.4 06/15/2018 1354   K 4.6 12/12/2016 1150   CL 102 06/15/2018 1354   CL 103 07/27/2012 1259   CO2 29 06/15/2018 1354   CO2 31 (H) 12/12/2016 1150   BUN 26 (H) 06/15/2018 1354   BUN 23.4 12/12/2016 1150   CREATININE 1.59 (H) 06/15/2018 1354   CREATININE 1.7 (H) 12/12/2016 1150      Component Value Date/Time   CALCIUM 10.5 06/15/2018 1354   CALCIUM 11.4 (H) 12/12/2016 1150   ALKPHOS 82 03/03/2018 1439   ALKPHOS 65 12/12/2016 1150   AST 27 03/03/2018 1439   AST 19 12/12/2016 1150   ALT 25 03/03/2018 1439   ALT 10 12/12/2016 1150   BILITOT 0.4 03/03/2018 1439   BILITOT 0.36 12/12/2016 1150       I spent 15 minutes counseling the patient face to face. The total time spent in the appointment was 20 minutes and more than 50% was on counseling.   All questions were answered. The patient knows to call the clinic with any problems, questions or concerns. No barriers to learning was detected.    [name], MD 6/4/20201:27 PM

## 2018-08-27 NOTE — Telephone Encounter (Signed)
Please let patient know that she needs to discuss general problem with her PCP Dr. Delfina Redwood.  I have taken my name off as the PCP

## 2018-08-27 NOTE — Telephone Encounter (Signed)
Called and spoke with staff of Dr. Dwyane Dee. Per Dr. Alvy Bimler, Ms. Gibbon is complaining ot right thumb pain. She has been seen by a hand surgeon in the past and she might need return appointment to be seen back. She will give message to Dr. Dwyane Dee.

## 2018-08-27 NOTE — Patient Instructions (Signed)
Cyanocobalamin, Pyridoxine, and Folate What is this medicine? A multivitamin containing folic acid, vitamin B6, and vitamin B12. This medicine may be used for other purposes; ask your health care provider or pharmacist if you have questions. COMMON BRAND NAME(S): AllanFol RX, AllanTex, Av-Vite FB, B Complex with Folic Acid, ComBgen, FaBB, Folamin, Folastin, [place], [place], [place], [place], [place], [employer], [place] RX 2.2, [place], [place] 2.2, Foltabs 800, Foltx, Homocysteine Formula, Niva-Fol, NuFol, [employer], Virt-Gard, Virt-Vite, Virt-Vite [place], Vita-Respa What should I tell my health care provider before I take this medicine? They need to know if you have any of these conditions: -bleeding or clotting disorder -history of anemia of any type -other chronic health condition -an unusual or allergic reaction to vitamins, other medicines, foods, dyes, or preservatives -pregnant or trying to get pregnant -breast-feeding How should I use this medicine? Take by mouth with a glass of water. May take with food. Follow the directions on the prescription label. It is usually given once a day. Do not take your medicine more often than directed. Contact your pediatrician regarding the use of this medicine in children. Special care may be needed. Overdosage: If you think you have taken too much of this medicine contact a poison control center or emergency room at once. NOTE: This medicine is only for you. Do not share this medicine with others. What if I miss a dose? If you miss a dose, take it as soon as you can. If it is almost time for your next dose, take only that dose. Do not take double or extra doses. What may interact with this medicine? -levodopa This list may not describe all possible interactions. Give your health care provider a list of all the medicines, herbs, non-prescription drugs, or dietary supplements you use. Also tell them if you smoke, drink alcohol, or use illegal drugs.  Some items may interact with your medicine. What should I watch for while using this medicine? See your health care professional for regular checks on your progress. Remember that vitamin supplements do not replace the need for good nutrition from a balanced diet. What side effects may I notice from receiving this medicine? Side effects that you should report to your doctor or health care professional as soon as possible: -allergic reaction such as skin rash or difficulty breathing -vomiting Side effects that usually do not require medical attention (report to your doctor or health care professional if they continue or are bothersome): -nausea -stomach upset This list may not describe all possible side effects. Call your doctor for medical advice about side effects. You may report side effects to FDA at 1-800-FDA-1088. Where should I keep my medicine? Keep out of the reach of children. Most vitamins should be stored at controlled room temperature. Check your specific product directions. Protect from heat and moisture. Throw away any unused medicine after the expiration date. NOTE: This sheet is a summary. It may not cover all possible information. If you have questions about this medicine, talk to your doctor, pharmacist, or health care provider.  2019 Elsevier/Gold Standard (2007-05-02 00:59:55) Darbepoetin Alfa injection What is this medicine? DARBEPOETIN ALFA (dar be POE e tin AL fa) helps your body make more red blood cells. It is used to treat anemia caused by chronic kidney failure and chemotherapy. This medicine may be used for other purposes; ask your health care provider or pharmacist if you have questions. COMMON BRAND NAME(S): Aranesp What should I tell my health care provider before I take this medicine?  They need to know if you have any of these conditions: -blood clotting disorders or history of blood clots -cancer patient not on chemotherapy -cystic fibrosis -heart disease,  such as angina, heart failure, or a history of a heart attack -hemoglobin level of 12 g/dL or greater -high blood pressure -low levels of folate, iron, or vitamin B12 -seizures -an unusual or allergic reaction to darbepoetin, erythropoietin, albumin, hamster proteins, latex, other medicines, foods, dyes, or preservatives -pregnant or trying to get pregnant -breast-feeding How should I use this medicine? This medicine is for injection into a vein or under the skin. It is usually given by a health care professional in a hospital or clinic setting. If you get this medicine at home, you will be taught how to prepare and give this medicine. Use exactly as directed. Take your medicine at regular intervals. Do not take your medicine more often than directed. It is important that you put your used needles and syringes in a special sharps container. Do not put them in a trash can. If you do not have a sharps container, call your pharmacist or healthcare provider to get one. A special MedGuide will be given to you by the pharmacist with each prescription and refill. Be sure to read this information carefully each time. Talk to your pediatrician regarding the use of this medicine in children. While this medicine may be used in children as young as 71 month of age for selected conditions, precautions do apply. Overdosage: If you think you have taken too much of this medicine contact a poison control center or emergency room at once. NOTE: This medicine is only for you. Do not share this medicine with others. What if I miss a dose? If you miss a dose, take it as soon as you can. If it is almost time for your next dose, take only that dose. Do not take double or extra doses. What may interact with this medicine? Do not take this medicine with any of the following medications: -epoetin alfa This list may not describe all possible interactions. Give your health care provider a list of all the medicines, herbs,  non-prescription drugs, or dietary supplements you use. Also tell them if you smoke, drink alcohol, or use illegal drugs. Some items may interact with your medicine. What should I watch for while using this medicine? Your condition will be monitored carefully while you are receiving this medicine. You may need blood work done while you are taking this medicine. This medicine may cause a decrease in vitamin B6. You should make sure that you get enough vitamin B6 while you are taking this medicine. Discuss the foods you eat and the vitamins you take with your health care professional. What side effects may I notice from receiving this medicine? Side effects that you should report to your doctor or health care professional as soon as possible: -allergic reactions like skin rash, itching or hives, swelling of the face, lips, or tongue -breathing problems -changes in vision -chest pain -confusion, trouble speaking or understanding -feeling faint or lightheaded, falls -high blood pressure -muscle aches or pains -pain, swelling, warmth in the leg -rapid weight gain -severe headaches -sudden numbness or weakness of the face, arm or leg -trouble walking, dizziness, loss of balance or coordination -seizures (convulsions) -swelling of the ankles, feet, hands -unusually weak or tired Side effects that usually do not require medical attention (report to your doctor or health care professional if they continue or are bothersome): -diarrhea -fever, chills (  flu-like symptoms) -headaches -nausea, vomiting -redness, stinging, or swelling at site where injected This list may not describe all possible side effects. Call your doctor for medical advice about side effects. You may report side effects to FDA at 1-800-FDA-1088. Where should I keep my medicine? Keep out of the reach of children. Store in a refrigerator between 2 and 8 degrees C (36 and 46 degrees F). Do not freeze. Do not shake. Throw away any  unused portion if using a single-dose vial. Throw away any unused medicine after the expiration date. NOTE: This sheet is a summary. It may not cover all possible information. If you have questions about this medicine, talk to your doctor, pharmacist, or health care provider.  2019 Elsevier/Gold Standard (2017-03-26 16:44:20)

## 2018-08-28 NOTE — Telephone Encounter (Signed)
Call pt and gave message to husband.

## 2018-09-02 DIAGNOSIS — M65311 Trigger thumb, right thumb: Secondary | ICD-10-CM | POA: Insufficient documentation

## 2018-09-02 DIAGNOSIS — M79645 Pain in left finger(s): Secondary | ICD-10-CM | POA: Diagnosis not present

## 2018-09-02 DIAGNOSIS — M65312 Trigger thumb, left thumb: Secondary | ICD-10-CM | POA: Diagnosis not present

## 2018-09-02 DIAGNOSIS — M79644 Pain in right finger(s): Secondary | ICD-10-CM | POA: Diagnosis not present

## 2018-09-03 ENCOUNTER — Other Ambulatory Visit: Payer: Self-pay | Admitting: Endocrinology

## 2018-09-07 ENCOUNTER — Telehealth: Payer: Self-pay | Admitting: Hematology and Oncology

## 2018-09-07 NOTE — Telephone Encounter (Signed)
Called and left msg with reminder of 8/4 appt. Mailed printout.

## 2018-09-08 ENCOUNTER — Ambulatory Visit: Payer: Medicare Other | Admitting: Podiatry

## 2018-09-15 DIAGNOSIS — S32000A Wedge compression fracture of unspecified lumbar vertebra, initial encounter for closed fracture: Secondary | ICD-10-CM | POA: Diagnosis not present

## 2018-09-15 DIAGNOSIS — M47816 Spondylosis without myelopathy or radiculopathy, lumbar region: Secondary | ICD-10-CM | POA: Diagnosis not present

## 2018-09-15 DIAGNOSIS — I10 Essential (primary) hypertension: Secondary | ICD-10-CM | POA: Diagnosis not present

## 2018-09-15 DIAGNOSIS — M546 Pain in thoracic spine: Secondary | ICD-10-CM | POA: Diagnosis not present

## 2018-09-22 ENCOUNTER — Other Ambulatory Visit: Payer: Self-pay | Admitting: Neurosurgery

## 2018-09-22 ENCOUNTER — Other Ambulatory Visit: Payer: Self-pay

## 2018-09-22 ENCOUNTER — Ambulatory Visit
Admission: RE | Admit: 2018-09-22 | Discharge: 2018-09-22 | Disposition: A | Discharge status: Home / Self Care | Payer: Medicare Other | Source: Ambulatory Visit | Attending: Neurosurgery | Admitting: Neurosurgery

## 2018-09-22 DIAGNOSIS — M545 Low back pain: Secondary | ICD-10-CM | POA: Diagnosis not present

## 2018-09-22 DIAGNOSIS — S32000A Wedge compression fracture of unspecified lumbar vertebra, initial encounter for closed fracture: Secondary | ICD-10-CM

## 2018-09-28 DIAGNOSIS — M545 Low back pain: Secondary | ICD-10-CM | POA: Diagnosis not present

## 2018-09-28 DIAGNOSIS — M5136 Other intervertebral disc degeneration, lumbar region: Secondary | ICD-10-CM | POA: Diagnosis not present

## 2018-09-28 DIAGNOSIS — S32000A Wedge compression fracture of unspecified lumbar vertebra, initial encounter for closed fracture: Secondary | ICD-10-CM | POA: Diagnosis not present

## 2018-09-28 DIAGNOSIS — M47816 Spondylosis without myelopathy or radiculopathy, lumbar region: Secondary | ICD-10-CM | POA: Diagnosis not present

## 2018-09-29 DIAGNOSIS — N3010 Interstitial cystitis (chronic) without hematuria: Secondary | ICD-10-CM | POA: Diagnosis not present

## 2018-09-30 ENCOUNTER — Ambulatory Visit (INDEPENDENT_AMBULATORY_CARE_PROVIDER_SITE_OTHER): Payer: Medicare Other | Admitting: Podiatry

## 2018-09-30 ENCOUNTER — Encounter: Payer: Self-pay | Admitting: Podiatry

## 2018-09-30 ENCOUNTER — Other Ambulatory Visit: Payer: Self-pay

## 2018-09-30 DIAGNOSIS — E1149 Type 2 diabetes mellitus with other diabetic neurological complication: Secondary | ICD-10-CM | POA: Diagnosis not present

## 2018-09-30 DIAGNOSIS — M79674 Pain in right toe(s): Secondary | ICD-10-CM

## 2018-09-30 DIAGNOSIS — B351 Tinea unguium: Secondary | ICD-10-CM

## 2018-09-30 DIAGNOSIS — M79675 Pain in left toe(s): Secondary | ICD-10-CM | POA: Diagnosis not present

## 2018-09-30 NOTE — Progress Notes (Signed)
Complaint:  Visit Type: Patient returns to my office for continued preventative foot care services. Complaint: Patient states" my nails have grown long and thick and become painful to walk and wear shoes" Patient has been diagnosed with DM with no foot complications. The patient presents for preventative foot care services. No changes to ROS  Podiatric Exam: Vascular: dorsalis pedis and posterior tibial pulses are palpable bilateral. Capillary return is immediate. Temperature gradient is WNL. Skin turgor WNL  Sensorium: Normal Semmes Weinstein monofilament test. Normal tactile sensation bilaterally. Nail Exam: Pt has thick disfigured discolored nails with subungual debris noted bilateral entire nail hallux through fifth toenails Ulcer Exam: There is no evidence of ulcer or pre-ulcerative changes or infection. Orthopedic Exam: Muscle tone and strength are WNL. No limitations in general ROM. No crepitus or effusions noted. Foot type and digits show no abnormalities. Bony prominences are unremarkable. Skin: No Porokeratosis. No infection or ulcers  Diagnosis:  Onychomycosis, , Pain in right toe, pain in left toes  Treatment & Plan Procedures and Treatment: Consent by patient was obtained for treatment procedures.   Debridement of mycotic and hypertrophic toenails, 1 through 5 bilateral and clearing of subungual debris. No ulceration, no infection noted. ABN signed for 2019 Return Visit-Office Procedure: Patient instructed to return to the office for a follow up visit 3 months for continued evaluation and treatment.    Mykaila Blunck DPM 

## 2018-10-02 DIAGNOSIS — E785 Hyperlipidemia, unspecified: Secondary | ICD-10-CM | POA: Diagnosis not present

## 2018-10-02 DIAGNOSIS — I25111 Atherosclerotic heart disease of native coronary artery with angina pectoris with documented spasm: Secondary | ICD-10-CM | POA: Diagnosis not present

## 2018-10-02 DIAGNOSIS — N189 Chronic kidney disease, unspecified: Secondary | ICD-10-CM | POA: Diagnosis not present

## 2018-10-02 DIAGNOSIS — E559 Vitamin D deficiency, unspecified: Secondary | ICD-10-CM | POA: Diagnosis not present

## 2018-10-02 DIAGNOSIS — R0789 Other chest pain: Secondary | ICD-10-CM | POA: Diagnosis not present

## 2018-10-02 DIAGNOSIS — I10 Essential (primary) hypertension: Secondary | ICD-10-CM | POA: Diagnosis not present

## 2018-10-02 DIAGNOSIS — I252 Old myocardial infarction: Secondary | ICD-10-CM | POA: Diagnosis not present

## 2018-10-02 DIAGNOSIS — E1122 Type 2 diabetes mellitus with diabetic chronic kidney disease: Secondary | ICD-10-CM | POA: Diagnosis not present

## 2018-10-02 DIAGNOSIS — E039 Hypothyroidism, unspecified: Secondary | ICD-10-CM | POA: Diagnosis not present

## 2018-10-02 DIAGNOSIS — D649 Anemia, unspecified: Secondary | ICD-10-CM | POA: Diagnosis not present

## 2018-10-05 ENCOUNTER — Telehealth: Payer: Self-pay | Admitting: Endocrinology

## 2018-10-06 NOTE — Telephone Encounter (Signed)
Patient has made a follow up appointment on 11/03/18

## 2018-10-07 ENCOUNTER — Other Ambulatory Visit: Payer: Self-pay

## 2018-10-07 ENCOUNTER — Telehealth: Payer: Self-pay | Admitting: Endocrinology

## 2018-10-07 MED ORDER — ACARBOSE 50 MG PO TABS: ORAL_TABLET | ORAL | 3 refills | Status: DC

## 2018-10-07 NOTE — Telephone Encounter (Signed)
Rx sent 

## 2018-10-07 NOTE — Telephone Encounter (Signed)
MEDICATION: Acarbose  PHARMACY:  [facility]    IS THIS A 90 DAY SUPPLY :   IS PATIENT OUT OF MEDICATION: yes  IF NOT; HOW MUCH IS LEFT:   LAST APPOINTMENT DATE: @7 /13/2020  NEXT APPOINTMENT DATE:@8 /01/2019  DO WE HAVE YOUR PERMISSION TO LEAVE A DETAILED MESSAGE: yes  OTHER COMMENTS:    **Let patient know to contact pharmacy at the end of the day to make sure medication is ready. **  ** Please notify patient to allow 48-72 hours to process**  **Encourage patient to contact the pharmacy for refills or they can request refills through [facility]**

## 2018-10-12 ENCOUNTER — Other Ambulatory Visit: Payer: Self-pay | Admitting: Endocrinology

## 2018-10-12 DIAGNOSIS — M79645 Pain in left finger(s): Secondary | ICD-10-CM | POA: Diagnosis not present

## 2018-10-12 DIAGNOSIS — M65312 Trigger thumb, left thumb: Secondary | ICD-10-CM | POA: Diagnosis not present

## 2018-10-12 DIAGNOSIS — M65311 Trigger thumb, right thumb: Secondary | ICD-10-CM | POA: Diagnosis not present

## 2018-10-12 DIAGNOSIS — M79644 Pain in right finger(s): Secondary | ICD-10-CM | POA: Diagnosis not present

## 2018-10-27 ENCOUNTER — Other Ambulatory Visit: Payer: Self-pay | Admitting: Endocrinology

## 2018-10-27 ENCOUNTER — Inpatient Hospital Stay: Payer: Medicare Other

## 2018-10-27 ENCOUNTER — Other Ambulatory Visit: Payer: Self-pay

## 2018-10-27 ENCOUNTER — Inpatient Hospital Stay: Payer: Medicare Other | Attending: Hematology and Oncology

## 2018-10-27 VITALS — BP 128/62 | HR 68 | Temp 98.2°F | Resp 18

## 2018-10-27 DIAGNOSIS — E538 Deficiency of other specified B group vitamins: Secondary | ICD-10-CM

## 2018-10-27 DIAGNOSIS — N183 Chronic kidney disease, stage 3 unspecified: Secondary | ICD-10-CM

## 2018-10-27 DIAGNOSIS — D631 Anemia in chronic kidney disease: Secondary | ICD-10-CM

## 2018-10-27 DIAGNOSIS — G8929 Other chronic pain: Secondary | ICD-10-CM | POA: Insufficient documentation

## 2018-10-27 DIAGNOSIS — M79644 Pain in right finger(s): Secondary | ICD-10-CM | POA: Diagnosis not present

## 2018-10-27 DIAGNOSIS — Z803 Family history of malignant neoplasm of breast: Secondary | ICD-10-CM | POA: Insufficient documentation

## 2018-10-27 DIAGNOSIS — Z79899 Other long term (current) drug therapy: Secondary | ICD-10-CM | POA: Diagnosis not present

## 2018-10-27 LAB — CBC WITH DIFFERENTIAL/PLATELET
Abs Immature Granulocytes: 0.02 10*3/uL (ref 0.00–0.07)
Basophils Absolute: 0 10*3/uL (ref 0.0–0.1)
Basophils Relative: 0 %
Eosinophils Absolute: 0.3 10*3/uL (ref 0.0–0.5)
Eosinophils Relative: 4 %
HCT: 34.3 % — ABNORMAL LOW (ref 36.0–46.0)
Hemoglobin: 10.5 g/dL — ABNORMAL LOW (ref 12.0–15.0)
Immature Granulocytes: 0 %
Lymphocytes Relative: 26 %
Lymphs Abs: 1.8 10*3/uL (ref 0.7–4.0)
MCH: 26.7 pg (ref 26.0–34.0)
MCHC: 30.6 g/dL (ref 30.0–36.0)
MCV: 87.3 fL (ref 80.0–100.0)
Monocytes Absolute: 0.4 10*3/uL (ref 0.1–1.0)
Monocytes Relative: 6 %
Neutro Abs: 4.4 10*3/uL (ref 1.7–7.7)
Neutrophils Relative %: 64 %
Platelets: 129 10*3/uL — ABNORMAL LOW (ref 150–400)
RBC: 3.93 MIL/uL (ref 3.87–5.11)
RDW: 14.7 % (ref 11.5–15.5)
WBC: 6.8 10*3/uL (ref 4.0–10.5)
nRBC: 0 % (ref 0.0–0.2)

## 2018-10-27 MED ORDER — CYANOCOBALAMIN 1000 MCG/ML IJ SOLN
1000.0000 ug | Freq: Once | INTRAMUSCULAR | Status: AC
  Administered 2018-10-27: 1000 ug via INTRAMUSCULAR

## 2018-10-27 MED ORDER — DARBEPOETIN ALFA 200 MCG/0.4ML IJ SOSY
PREFILLED_SYRINGE | INTRAMUSCULAR | Status: AC
  Filled 2018-10-27: qty 0.4

## 2018-10-27 MED ORDER — DARBEPOETIN ALFA 200 MCG/0.4ML IJ SOSY
200.0000 ug | PREFILLED_SYRINGE | Freq: Once | INTRAMUSCULAR | Status: AC
  Administered 2018-10-27: 200 ug via SUBCUTANEOUS

## 2018-10-27 NOTE — Patient Instructions (Signed)
Cyanocobalamin, Vitamin B12 injection What is this medicine? CYANOCOBALAMIN (sye an oh koe BAL a min) is a man made form of vitamin B12. Vitamin B12 is used in the growth of healthy blood cells, nerve cells, and proteins in the body. It also helps with the metabolism of fats and carbohydrates. This medicine is used to treat people who can not absorb vitamin B12. This medicine may be used for other purposes; ask your health care provider or pharmacist if you have questions. COMMON BRAND NAME(S): B-12 Compliance Kit, B-12 Injection Kit, Cyomin, LA-12, Nutri-Twelve, Physicians EZ Use B-12, Primabalt What should I tell my health care provider before I take this medicine? They need to know if you have any of these conditions: -kidney disease -Leber's disease -megaloblastic anemia -an unusual or allergic reaction to cyanocobalamin, cobalt, other medicines, foods, dyes, or preservatives -pregnant or trying to get pregnant -breast-feeding How should I use this medicine? This medicine is injected into a muscle or deeply under the skin. It is usually given by a health care professional in a clinic or doctor's office. However, your doctor may teach you how to inject yourself. Follow all instructions. Talk to your pediatrician regarding the use of this medicine in children. Special care may be needed. Overdosage: If you think you have taken too much of this medicine contact a poison control center or emergency room at once. NOTE: This medicine is only for you. Do not share this medicine with others. What if I miss a dose? If you are given your dose at a clinic or doctor's office, call to reschedule your appointment. If you give your own injections and you miss a dose, take it as soon as you can. If it is almost time for your next dose, take only that dose. Do not take double or extra doses. What may interact with this medicine? -colchicine -heavy alcohol intake This list may not describe all possible  interactions. Give your health care provider a list of all the medicines, herbs, non-prescription drugs, or dietary supplements you use. Also tell them if you smoke, drink alcohol, or use illegal drugs. Some items may interact with your medicine. What should I watch for while using this medicine? Visit your doctor or health care professional regularly. You may need blood work done while you are taking this medicine. You may need to follow a special diet. Talk to your doctor. Limit your alcohol intake and avoid smoking to get the best benefit. What side effects may I notice from receiving this medicine? Side effects that you should report to your doctor or health care professional as soon as possible: -allergic reactions like skin rash, itching or hives, swelling of the face, lips, or tongue -blue tint to skin -chest tightness, pain -difficulty breathing, wheezing -dizziness -red, swollen painful area on the leg Side effects that usually do not require medical attention (report to your doctor or health care professional if they continue or are bothersome): -diarrhea -headache This list may not describe all possible side effects. Call your doctor for medical advice about side effects. You may report side effects to FDA at 1-800-FDA-1088. Where should I keep my medicine? Keep out of the reach of children. Store at room temperature between 15 and 30 degrees C (59 and 85 degrees F). Protect from light. Throw away any unused medicine after the expiration date. NOTE: This sheet is a summary. It may not cover all possible information. If you have questions about this medicine, talk to your doctor, pharmacist, or   health care provider.  2019 Elsevier/Gold Standard (2007-06-22 22:10:20) Darbepoetin Alfa injection What is this medicine? DARBEPOETIN ALFA (dar be POE e tin AL fa) helps your body make more red blood cells. It is used to treat anemia caused by chronic kidney failure and chemotherapy. This  medicine may be used for other purposes; ask your health care provider or pharmacist if you have questions. COMMON BRAND NAME(S): Aranesp What should I tell my health care provider before I take this medicine? They need to know if you have any of these conditions:  blood clotting disorders or history of blood clots  cancer patient not on chemotherapy  cystic fibrosis  heart disease, such as angina, heart failure, or a history of a heart attack  hemoglobin level of 12 g/dL or greater  high blood pressure  low levels of folate, iron, or vitamin B12  seizures  an unusual or allergic reaction to darbepoetin, erythropoietin, albumin, hamster proteins, latex, other medicines, foods, dyes, or preservatives  pregnant or trying to get pregnant  breast-feeding How should I use this medicine? This medicine is for injection into a vein or under the skin. It is usually given by a health care professional in a hospital or clinic setting. If you get this medicine at home, you will be taught how to prepare and give this medicine. Use exactly as directed. Take your medicine at regular intervals. Do not take your medicine more often than directed. It is important that you put your used needles and syringes in a special sharps container. Do not put them in a trash can. If you do not have a sharps container, call your pharmacist or healthcare provider to get one. A special MedGuide will be given to you by the pharmacist with each prescription and refill. Be sure to read this information carefully each time. Talk to your pediatrician regarding the use of this medicine in children. While this medicine may be used in children as young as 58 month of age for selected conditions, precautions do apply. Overdosage: If you think you have taken too much of this medicine contact a poison control center or emergency room at once. NOTE: This medicine is only for you. Do not share this medicine with others. What if  I miss a dose? If you miss a dose, take it as soon as you can. If it is almost time for your next dose, take only that dose. Do not take double or extra doses. What may interact with this medicine? Do not take this medicine with any of the following medications:  epoetin alfa This list may not describe all possible interactions. Give your health care provider a list of all the medicines, herbs, non-prescription drugs, or dietary supplements you use. Also tell them if you smoke, drink alcohol, or use illegal drugs. Some items may interact with your medicine. What should I watch for while using this medicine? Your condition will be monitored carefully while you are receiving this medicine. You may need blood work done while you are taking this medicine. This medicine may cause a decrease in vitamin B6. You should make sure that you get enough vitamin B6 while you are taking this medicine. Discuss the foods you eat and the vitamins you take with your health care professional. What side effects may I notice from receiving this medicine? Side effects that you should report to your doctor or health care professional as soon as possible:  allergic reactions like skin rash, itching or hives, swelling of the  face, lips, or tongue  breathing problems  changes in vision  chest pain  confusion, trouble speaking or understanding  feeling faint or lightheaded, falls  high blood pressure  muscle aches or pains  pain, swelling, warmth in the leg  rapid weight gain  severe headaches  sudden numbness or weakness of the face, arm or leg  trouble walking, dizziness, loss of balance or coordination  seizures (convulsions)  swelling of the ankles, feet, hands  unusually weak or tired Side effects that usually do not require medical attention (report to your doctor or health care professional if they continue or are bothersome):  diarrhea  fever, chills (flu-like  symptoms)  headaches  nausea, vomiting  redness, stinging, or swelling at site where injected This list may not describe all possible side effects. Call your doctor for medical advice about side effects. You may report side effects to FDA at 1-800-FDA-1088. Where should I keep my medicine? Keep out of the reach of children. Store in a refrigerator between 2 and 8 degrees C (36 and 46 degrees F). Do not freeze. Do not shake. Throw away any unused portion if using a single-dose vial. Throw away any unused medicine after the expiration date. NOTE: This sheet is a summary. It may not cover all possible information. If you have questions about this medicine, talk to your doctor, pharmacist, or health care provider.  2020 Elsevier/Gold Standard (2017-03-26 16:44:20)

## 2018-10-30 ENCOUNTER — Other Ambulatory Visit (INDEPENDENT_AMBULATORY_CARE_PROVIDER_SITE_OTHER): Payer: Medicare Other

## 2018-10-30 ENCOUNTER — Other Ambulatory Visit: Payer: Self-pay

## 2018-10-30 DIAGNOSIS — E1165 Type 2 diabetes mellitus with hyperglycemia: Secondary | ICD-10-CM

## 2018-10-30 DIAGNOSIS — E7800 Pure hypercholesterolemia, unspecified: Secondary | ICD-10-CM | POA: Diagnosis not present

## 2018-10-30 DIAGNOSIS — E039 Hypothyroidism, unspecified: Secondary | ICD-10-CM

## 2018-10-30 LAB — COMPREHENSIVE METABOLIC PANEL
ALT: 12 U/L (ref 0–35)
AST: 15 U/L (ref 0–37)
Albumin: 4 g/dL (ref 3.5–5.2)
Alkaline Phosphatase: 75 U/L (ref 39–117)
BUN: 28 mg/dL — ABNORMAL HIGH (ref 6–23)
CO2: 27 mEq/L (ref 19–32)
Calcium: 9.5 mg/dL (ref 8.4–10.5)
Chloride: 104 mEq/L (ref 96–112)
Creatinine, Ser: 1.99 mg/dL — ABNORMAL HIGH (ref 0.40–1.20)
GFR: 24.39 mL/min — ABNORMAL LOW (ref >60.00)
Glucose, Bld: 84 mg/dL (ref 70–99)
Potassium: 4.1 mEq/L (ref 3.5–5.1)
Sodium: 139 mEq/L (ref 135–145)
Total Bilirubin: 0.3 mg/dL (ref 0.2–1.2)
Total Protein: 6 g/dL (ref 6.0–8.3)

## 2018-10-30 LAB — MICROALBUMIN / CREATININE URINE RATIO
Creatinine,U: 74.6 mg/dL
Microalb Creat Ratio: 1.3 mg/g (ref 0.0–30.0)
Microalb, Ur: 1 mg/dL (ref 0.0–1.9)

## 2018-10-30 LAB — TSH: TSH: 10 u[IU]/mL — ABNORMAL HIGH (ref 0.35–4.50)

## 2018-10-30 LAB — LIPID PANEL
Cholesterol: 144 mg/dL (ref 0–200)
HDL: 44.1 mg/dL (ref >39.00)
LDL Cholesterol: 68 mg/dL (ref 0–99)
NonHDL: 100.06
Total CHOL/HDL Ratio: 3
Triglycerides: 162 mg/dL — ABNORMAL HIGH (ref 0.0–149.0)
VLDL: 32.4 mg/dL (ref 0.0–40.0)

## 2018-10-30 LAB — T4, FREE: Free T4: 0.98 ng/dL (ref 0.60–1.60)

## 2018-10-30 LAB — HEMOGLOBIN A1C: Hgb A1c MFr Bld: 6.9 % — ABNORMAL HIGH (ref 4.6–6.5)

## 2018-11-03 ENCOUNTER — Other Ambulatory Visit: Payer: Self-pay

## 2018-11-03 ENCOUNTER — Ambulatory Visit (INDEPENDENT_AMBULATORY_CARE_PROVIDER_SITE_OTHER): Payer: Medicare Other | Admitting: Endocrinology

## 2018-11-03 ENCOUNTER — Ambulatory Visit: Payer: Medicare Other | Admitting: Endocrinology

## 2018-11-03 ENCOUNTER — Encounter: Payer: Self-pay | Admitting: Endocrinology

## 2018-11-03 VITALS — BP 102/50 | HR 58 | Ht 61.0 in | Wt 128.8 lb

## 2018-11-03 DIAGNOSIS — N184 Chronic kidney disease, stage 4 (severe): Secondary | ICD-10-CM

## 2018-11-03 DIAGNOSIS — E039 Hypothyroidism, unspecified: Secondary | ICD-10-CM | POA: Diagnosis not present

## 2018-11-03 DIAGNOSIS — E1165 Type 2 diabetes mellitus with hyperglycemia: Secondary | ICD-10-CM | POA: Diagnosis not present

## 2018-11-03 DIAGNOSIS — E7800 Pure hypercholesterolemia, unspecified: Secondary | ICD-10-CM | POA: Diagnosis not present

## 2018-11-03 MED ORDER — SITAGLIPTIN PHOSPHATE 25 MG PO TABS: 25.0000 mg | ORAL_TABLET | Freq: Every day | ORAL | 2 refills | Status: DC

## 2018-11-03 NOTE — Patient Instructions (Addendum)
Stop Janumet and start januvia in am pm  Take 2 tabs of Synthroid 1x per week other days 1 tab  Take 1 1/2 Furosemide for 3-4 days

## 2018-11-03 NOTE — Progress Notes (Signed)
Patient ID: Abigail Hoover, female   DOB: [birth year], 73 y.o.   MRN: [id number]    Reason for Appointment:  follow-up of various issues   History of Present Illness   Diagnosis: Type 2 DIABETES MELITUS, long-standing  She has had mild diabetes for several years but has required multiple drugs for control Usually has had postprandial hyperglycemia, this has been better controlled with adding Glyset to her main meals Has never been on insulin or a GLP-1 drug Actos had been stopped previously because of tendency to edema  Recent history:   Oral hypoglycemic drugs:  Precose 50 mg before breakfast and lunch, 100 mg dinner, Prandin 1 mg before breakfast, 1 mg at dinnertime, Janumet 50/500 at dinnertime  Her A1c is 6.9, previously 6.3 Last fructosamine was higher at 293  Current management, problems identified and blood sugar patterns:  She was told to make sure she took her Prandin before starting to eat on her last visit  She appears to be having some hypoglycemia last month with a couple of readings in the 50s early morning and once at about 1 PM, not in the last week or so  However she is not checking blood sugars in the mornings on a regular basis  Blood sugars after dinner have been quite variable in the last month although in the last week they have been mostly about 160-180 range and occasionally lower  She says that certain carbohydrates like potatoes make her blood sugar go up, usually not eating a lot of sweets but occasionally fruits like watermelon  She is concerned about her weight gain but she has gained only 1 pound  She again does not like to do any formal exercise  Side effects from medications: None Proper timing of medications in relation to meals: Yes.          Monitors blood glucose: 1 time a day or less .    Glucometer: One Touch        Blood Glucose readings from download    PRE-MEAL  4-6 AM Lunch Dinner  overnight Overall  Glucose range:   50-125  46, 73, 183     Mean/median:      149+/-60   POST-MEAL PC Breakfast PC Lunch PC Dinner  Glucose range:    113-217  Mean/median:    170   PREVIOUS readings:  PRE-MEAL Fasting Lunch Dinner Bedtime Overall  Glucose range:  72-181  182  66    Mean/median:  118     138   POST-MEAL PC Breakfast PC Lunch PC Dinner  Glucose range:   69  75-232  Mean/median:    165        Meals: 2- 3 meals per day.   Generally eating small breakfast and lunch but with only little protein, is vegetarian   EXERCISE:  None, only activity is housework            Dietician visit: Most recent: None    Wt Readings from Last 3 Encounters:  11/03/18 128 lb 12.8 oz (58.4 kg)  08/27/18 127 lb 6.4 oz (57.8 kg)  06/18/18 127 lb 6.4 oz (57.8 kg)   Lab Results  Component Value Date   HGBA1C 6.9 (H) 10/30/2018   HGBA1C 6.3 03/03/2018   HGBA1C 5.7 10/21/2017   Lab Results  Component Value Date   MICROALBUR 1.0 10/30/2018   LDLCALC 68 10/30/2018   CREATININE 1.99 (H) 10/30/2018      Lab Results  Component Value  Date   FRUCTOSAMINE 293 (H) 06/15/2018   FRUCTOSAMINE 269 01/08/2018   FRUCTOSAMINE 266 07/26/2016   FRUCTOSAMINE 230 12/07/2014     OTHER problems Addressed today are in review of systems:    Allergies as of 11/03/2018      Reactions   Atorvastatin Rash   Ezetimibe Itching      Medication List       Accurate as of November 03, 2018  3:27 PM. If you have any questions, ask your nurse or doctor.        STOP taking these medications   amLODipine 2.5 MG tablet Commonly known as: NORVASC Stopped by: Elayne Snare, MD   [place] by: Elayne Snare, MD     TAKE these medications   acarbose 50 MG tablet Commonly known as: PRECOSE Take 1 tablet by mouth at breakfast, 1 tablet at lunch, and 1 and a half tablets at dinner.   aspirin EC 81 MG tablet Take 81 mg by mouth daily. Take 1 tablet by mouth once daily.   Carafate 1 GM/10ML suspension Generic drug:  sucralfate TAKE 10ML BY MOUTH 4 (FOUR) TIMES A DAY BETWEEN MEALS AND AT BEDTIME   Dexilant 60 MG capsule Generic drug: dexlansoprazole Take 60 mg by mouth daily. Take 1 tablet by mouth once daily.   Elmiron 100 MG capsule Generic drug: pentosan polysulfate TAKE 1 CAPSULE (100 MG TOTAL) BY MOUTH 3 TIMES DAILY BEFORE MEALS.   furosemide 40 MG tablet Commonly known as: LASIX Take 40 mg by mouth. Take 1 tablet by mouth three times per week.   gabapentin 300 MG capsule Commonly known as: NEURONTIN Take by mouth.   glucose blood test strip Commonly known as: OneTouch Verio USE AS INSTRUCTED TO CHECK BLOOD SUGAR ONCE A DAY ALTERNATING BETWEEN FASTING GLUCOSE CLECKS AND AFTER MEAL CHECKS.   hydrocortisone 2.5 % ointment APPLY TO AFFECED ARES(S) TOPICALLY TWICE DAILY AS NEEDED   ibandronate 150 MG tablet Commonly known as: BONIVA Take 150 mg by mouth every 30 (thirty) days. Take 1 tablet by mouth once per month.   Insulin Syringe-Needle U-100 25G X 5/8" 1 ML Misc USE DAILY TO INJECT INSULIN   isosorbide mononitrate 120 MG 24 hr tablet Commonly known as: IMDUR Take 120 mg by mouth daily. Take 1 tablet by mouth once daily.   Janumet 50-500 MG tablet Generic drug: sitaGLIPtin-metformin TAKE 1 TABLET BY MOUTH DAILY WITH BREAKFAST.   metoprolol succinate 50 MG 24 hr tablet Commonly known as: TOPROL-XL Take 1 tablet (50 mg total) by mouth daily. Take with or immediately following a meal.   Myrbetriq 50 MG Tb24 tablet Generic drug: mirabegron ER Take by mouth.   nitroGLYCERIN 0.4 MG SL tablet Commonly known as: NITROSTAT Place 1 tablet (0.4 mg total) under the tongue every 5 (five) minutes x 3 doses as needed for chest pain.   OneTouch Delica Lancets 00X Misc OneTouch Delica Lancets 33 gauge  TEST BLOOD SUGAR ONCE DAILY What changed: Another medication with the same name was removed. Continue taking this medication, and follow the directions you see here. Changed by: Elayne Snare, MD   pravastatin 20 MG tablet Commonly known as: PRAVACHOL TAKE ONE TABLET BY MOUTH ONCE DAILY   repaglinide 1 MG tablet Commonly known as: PRANDIN TAKE 1 TABLET (1 MG TOTAL) BY MOUTH 2 (TWO) TIMES DAILY BEFORE A MEAL.   Synthroid 75 MCG tablet Generic drug: levothyroxine TAKE ONE TABLET BY MOUTH ONCE DAILY   triamcinolone ointment  0.1 % Commonly known as: KENALOG APPLY THIN LAYER ON THE SKIN TWICE A DAY FOR 3 WEEKS       Allergies:  Allergies  Allergen Reactions  . Atorvastatin Rash  . Ezetimibe Itching    Past Medical History:  Diagnosis Date  . 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 Anxiety   . Arthritis   . Bladder irritation   . Diabetes mellitus   . GERD (gastroesophageal reflux disease)   . Hypercholesterolemia   . Hypertension   . Hypothyroidism   . Iron deficiency anemia, unspecified   . Psoriasis   . Transfusion history 03/05/2016   for postoperative (ORIF) anemia superimposed on chronic anemia    Past Surgical History:  Procedure Laterality Date  . ABDOMINAL HYSTERECTOMY     TAH  . BREAST BIOPSY     Left breast biopsy benign lesion  . BREAST BIOPSY  03/11/2012   Procedure: BREAST BIOPSY;  Surgeon: Odis Hollingshead, MD;  Location: [facility];  Service: General;  Laterality: Left;  remove left breast mass  . BREAST EXCISIONAL BIOPSY Left   . CARDIAC CATHETERIZATION N/A 01/02/2015   Procedure: Left Heart Cath and Coronary Angiography;  Surgeon: Charolette Forward, MD;  Location: [facility];  Service: Cardiovascular;  Laterality: N/A;  . CATARACT EXTRACTION    . CERVICAL DISC SURGERY    . COLONOSCOPY  2013   neg. next 2023.   Marland Kitchen EYE SURGERY    . LEFT HIP SURGERY    . TOTAL HIP ARTHROPLASTY Left 03/02/2016   Procedure: TOTAL HIP ARTHROPLASTY ANTERIOR APPROACH;  Surgeon: Rod Can, MD;  Location: [facility];  Service: Orthopedics;  Laterality: Left;    Family History  Problem Relation Age of Onset  . Heart disease Mother        heart  attack  . Stroke Father        brain hemorrhage  . Diabetes Brother   . Cancer Brother   . Breast cancer Sister   . Cancer Sister        breast cancer    Social History:  reports that she has never smoked. She has never used smokeless tobacco. She reports that she does not drink alcohol or use drugs.  Review of Systems:     HYPERTENSION: Mild, this is well-controlled with metoprolol 50 mg only Followed by cardiologist  RENAL dysfunction has been variable and followed by nephrologist every 3 months Not clear why creatinine is higher, has not had a recent ultrasound of kidneys  No hyperkalemia   She takes Lasix 40 mg for  edema but edema may be worse recently  Lab Results  Component Value Date   CREATININE 1.99 (H) 10/30/2018   CREATININE 1.59 (H) 06/15/2018   CREATININE 1.69 (H) 03/03/2018     HYPERLIPIDEMIA: The lipid abnormality consists of elevated LDL which is mild and well controlled with pravastatin 20 mg LDL consistently below 100 History of possible rash from Lipitor  Lab Results  Component Value Date   CHOL 144 10/30/2018   HDL 44.10 10/30/2018   LDLCALC 68 10/30/2018   TRIG 162.0 (H) 10/30/2018   CHOLHDL 3 10/30/2018    She has a long history of primary HYPOTHYROIDISM   She takes SYNTHROID daily before breakfast  TSH has been usually fairly normal but now it is unusually high at 10.0 She thinks she has not missed any medication lately Usually needing iron intravenously and not orally    Lab Results  Component Value  Date   TSH 10.00 (H) 10/30/2018   TSH 3.36 03/03/2018   TSH 1.64 10/21/2017   FREET4 0.98 10/30/2018   FREET4 1.35 12/20/2016   FREET4 1.07 04/18/2016     Has been taking gabapentin because of bladder issues and recommended by a urologist a few years ago    Diabetic foot exam in 12/19: Normal monofilament sensation   OSTEOPENIA: Her T score Previously done by gynecologist was -2.1, currently on Boniva started in 01/2014   Last bone density was -2.6 in 2/18, this was ordered by her gastroenterologist    Examination:   BP (!) 102/50 (BP Location: Right Arm, Patient Position: Sitting, Cuff Size: Normal)   Pulse (!) 58   Ht 5\' 1"  (1.549 m)   Wt 128 lb 12.8 oz (58.4 kg)   SpO2 97%   BMI 24.34 kg/m   Body mass index is 24.34 kg/m.    2+ lower leg edema present  ASSESSMENT/ PLAN:    Diabetes type 2, Nonobese   See history of present illness for detailed discussion of current diabetes management, blood sugar patterns and problems identified  Her A1c is 6.9  Although this is adequate not clear if A1c is accurate given her history of anemia and chronic kidney disease She has variable readings after dinner based on her carbohydrate intake However appears that she is having occasional hypoglycemia at least late last month overnight This may be related to effects of metformin with her creatinine clearance dipping down to 24 only now   She has less hyperglycemia after dinner in the last week and overall average 170 postprandially She does need to continue improving her diet with less carbohydrate and more protein also   She will try to make sure she takes her Prandin just before her meals Also her this can be taken with acarbose  For now we will stop her Janumet and only use 25 mg Januvia Consider reducing acarbose dose if kidney function continues to worsen  RENAL dysfunction: This is somewhat worse now Follow-up with nephrologist to be done in the next week Labs forwarded May need to have ultrasound done to rule out obstructive uropathy  CALCIUM levels: Previously upper normal and now more normal, likely to be getting PTH done from nephrologist  HYPOTHYROIDISM: Her TSH is higher, she does have some nonspecific fatigue Does not appear to have any interacting supplements or medications being taken She has been regular with her levothyroxine  Since she just got a supply of 75 mcg will let her use  this with 8 tablets a week Next prescription will be for 88 mcg She will follow-up in 6 weeks  EDEMA: She needs to increase her Lasix to 60 mg until seen by nephrologist  Hypercholesterolemia: Well-controlled with LDL 68, has only mild increase in nonfasting triglycerides.  To continue pravastatin  Total visit time for evaluation and management of multiple problems and counseling =25 minutes  There are no Patient Instructions on file for this visit.    Elayne Snare 11/03/2018, 3:27 PM

## 2018-11-04 DIAGNOSIS — N183 Chronic kidney disease, stage 3 (moderate): Secondary | ICD-10-CM | POA: Diagnosis not present

## 2018-11-06 ENCOUNTER — Other Ambulatory Visit: Payer: Self-pay | Admitting: Endocrinology

## 2018-11-11 DIAGNOSIS — R399 Unspecified symptoms and signs involving the genitourinary system: Secondary | ICD-10-CM | POA: Diagnosis not present

## 2018-11-11 DIAGNOSIS — I129 Hypertensive chronic kidney disease with stage 1 through stage 4 chronic kidney disease, or unspecified chronic kidney disease: Secondary | ICD-10-CM | POA: Diagnosis not present

## 2018-11-11 DIAGNOSIS — D631 Anemia in chronic kidney disease: Secondary | ICD-10-CM | POA: Diagnosis not present

## 2018-11-11 DIAGNOSIS — N184 Chronic kidney disease, stage 4 (severe): Secondary | ICD-10-CM | POA: Diagnosis not present

## 2018-11-11 DIAGNOSIS — N2581 Secondary hyperparathyroidism of renal origin: Secondary | ICD-10-CM | POA: Diagnosis not present

## 2018-11-26 ENCOUNTER — Telehealth: Payer: Self-pay | Admitting: Endocrinology

## 2018-11-26 NOTE — Telephone Encounter (Signed)
Assuming her blood sugars are high after dinner she can take 2 tablets of repaglinide 1 mg, 15 minutes before eating her evening meal.  Also make sure her sugars are not low in the morning. Please remind her to reschedule her husband's missed appointment

## 2018-11-26 NOTE — Telephone Encounter (Signed)
Pt is on [place] but her BL readings are rising  She only eats veggies so she doesn't understand why  9/1 215 Yesterday 181 Not checked today

## 2018-11-26 NOTE — Telephone Encounter (Signed)
Called pt and gave her MD message. Pt verbalized understanding. 

## 2018-12-05 ENCOUNTER — Other Ambulatory Visit: Payer: Self-pay | Admitting: Endocrinology

## 2018-12-07 DIAGNOSIS — R399 Unspecified symptoms and signs involving the genitourinary system: Secondary | ICD-10-CM | POA: Diagnosis not present

## 2018-12-14 DIAGNOSIS — M79641 Pain in right hand: Secondary | ICD-10-CM | POA: Diagnosis not present

## 2018-12-14 DIAGNOSIS — M65311 Trigger thumb, right thumb: Secondary | ICD-10-CM | POA: Diagnosis not present

## 2018-12-14 DIAGNOSIS — M79645 Pain in left finger(s): Secondary | ICD-10-CM | POA: Diagnosis not present

## 2018-12-15 ENCOUNTER — Other Ambulatory Visit: Payer: Self-pay | Admitting: General Surgery

## 2018-12-15 DIAGNOSIS — Z1231 Encounter for screening mammogram for malignant neoplasm of breast: Secondary | ICD-10-CM

## 2018-12-16 ENCOUNTER — Other Ambulatory Visit (INDEPENDENT_AMBULATORY_CARE_PROVIDER_SITE_OTHER): Payer: Medicare Other

## 2018-12-16 ENCOUNTER — Other Ambulatory Visit: Payer: Self-pay

## 2018-12-16 DIAGNOSIS — E1165 Type 2 diabetes mellitus with hyperglycemia: Secondary | ICD-10-CM

## 2018-12-16 DIAGNOSIS — E039 Hypothyroidism, unspecified: Secondary | ICD-10-CM | POA: Diagnosis not present

## 2018-12-17 ENCOUNTER — Other Ambulatory Visit: Payer: Medicare Other

## 2018-12-17 LAB — BASIC METABOLIC PANEL
BUN: 32 mg/dL — ABNORMAL HIGH (ref 6–23)
CO2: 28 mEq/L (ref 19–32)
Calcium: 9.8 mg/dL (ref 8.4–10.5)
Chloride: 101 mEq/L (ref 96–112)
Creatinine, Ser: 2.22 mg/dL — ABNORMAL HIGH (ref 0.40–1.20)
GFR: 21.49 mL/min — ABNORMAL LOW (ref >60.00)
Glucose, Bld: 167 mg/dL — ABNORMAL HIGH (ref 70–99)
Potassium: 3.7 mEq/L (ref 3.5–5.1)
Sodium: 137 mEq/L (ref 135–145)

## 2018-12-17 LAB — TSH: TSH: 1.58 u[IU]/mL (ref 0.35–4.50)

## 2018-12-17 LAB — FRUCTOSAMINE: Fructosamine: 280 umol/L (ref 0–285)

## 2018-12-17 LAB — T4, FREE: Free T4: 1.56 ng/dL (ref 0.60–1.60)

## 2018-12-22 ENCOUNTER — Encounter: Payer: Self-pay | Admitting: Endocrinology

## 2018-12-22 ENCOUNTER — Ambulatory Visit (INDEPENDENT_AMBULATORY_CARE_PROVIDER_SITE_OTHER): Payer: Medicare Other | Admitting: Endocrinology

## 2018-12-22 ENCOUNTER — Other Ambulatory Visit: Payer: Self-pay

## 2018-12-22 VITALS — BP 110/62 | HR 58 | Temp 98.1°F | Wt 125.2 lb

## 2018-12-22 DIAGNOSIS — N184 Chronic kidney disease, stage 4 (severe): Secondary | ICD-10-CM | POA: Diagnosis not present

## 2018-12-22 DIAGNOSIS — E039 Hypothyroidism, unspecified: Secondary | ICD-10-CM | POA: Diagnosis not present

## 2018-12-22 DIAGNOSIS — Z23 Encounter for immunization: Secondary | ICD-10-CM

## 2018-12-22 DIAGNOSIS — E1165 Type 2 diabetes mellitus with hyperglycemia: Secondary | ICD-10-CM

## 2018-12-22 MED ORDER — REPAGLINIDE 2 MG PO TABS: ORAL_TABLET | ORAL | 2 refills | Status: DC

## 2018-12-22 NOTE — Progress Notes (Signed)
Patient ID: Abigail Hoover, female   DOB: 15-Sep-1945, 73 y.o.   MRN: [id number]    Reason for Appointment:  follow-up of various issues   History of Present Illness   Diagnosis: Type 2 DIABETES MELITUS, long-standing  She has had mild diabetes for several years but has required multiple drugs for control Usually has had postprandial hyperglycemia, this has been better controlled with adding Glyset to her main meals Has never been on insulin or a GLP-1 drug Actos had been stopped previously because of tendency to edema  Recent history:   Oral hypoglycemic drugs:  Precose 50 mg before breakfast and lunch, 100 mg dinner, Prandin 1 mg before breakfast, 2 mg at dinnertime, Janumet 50/500 at dinnertime  Her A1c is last 6.9, previously 6.3  Fructosamine is better at 280  Current management, problems identified and blood sugar patterns:  She has been taken off metformin because of higher creatinine  Now taking 2 mg of Prandin before dinnertime  As before she tends to have high readings after dinner, checking 2 hours later  She had called again to say that her sugars were higher after evening meal  However she still is not getting enough protein to her meals and mostly eating lentils as a source of protein  Also at times will have rice in the evening at dinner  Although she skips lunch her sugars sometimes in the early afternoon have been over 200 but only in the lab the blood sugar was 167 late afternoon  She does not like to exercise because he thinks it makes her legs hurt  No readings done 2 hours after breakfast  Side effects from medications: None Proper timing of medications in relation to meals: Yes.          Monitors blood glucose: 1 time a day or less .    Glucometer: One Touch        Blood Glucose readings from download    PRE-MEAL Fasting Lunch Dinner Bedtime Overall  Glucose range:  65-180      Mean/median:  109     195   POST-MEAL PC Breakfast PC  Lunch PC Dinner  Glucose range:   214, 268  152-301  Mean/median:    210   Previous readings:  PRE-MEAL  4-6 AM Lunch Dinner  overnight Overall  Glucose range:  50-125  46, 73, 183     Mean/median:      149+/-60   POST-MEAL PC Breakfast PC Lunch PC Dinner  Glucose range:    113-217  Mean/median:    170         Meals: 2- 3 meals per day.   Generally eating small breakfast and lunch but with only little protein, is vegetarian   EXERCISE:  None, only activity is housework            Dietician visit: Most recent: None    Wt Readings from Last 3 Encounters:  12/22/18 125 lb 3.2 oz (56.8 kg)  11/03/18 128 lb 12.8 oz (58.4 kg)  08/27/18 127 lb 6.4 oz (57.8 kg)   Lab Results  Component Value Date   HGBA1C 6.9 (H) 10/30/2018   HGBA1C 6.3 03/03/2018   HGBA1C 5.7 10/21/2017   Lab Results  Component Value Date   MICROALBUR 1.0 10/30/2018   LDLCALC 68 10/30/2018   CREATININE 2.22 (H) 12/16/2018      Lab Results  Component Value Date   FRUCTOSAMINE 280 12/16/2018   FRUCTOSAMINE 293 (H) 06/15/2018  FRUCTOSAMINE 269 01/08/2018   FRUCTOSAMINE 266 07/26/2016     OTHER problems Addressed today are in review of systems:    Allergies as of 12/22/2018      Reactions   Atorvastatin Rash   Ezetimibe Itching      Medication List       Accurate as of December 22, 2018  9:01 PM. If you have any questions, ask your nurse or doctor.        acarbose 50 MG tablet Commonly known as: PRECOSE Take 1 tablet by mouth at breakfast, 1 tablet at lunch, and 1 and a half tablets at dinner.   aspirin EC 81 MG tablet Take 81 mg by mouth daily. Take 1 tablet by mouth once daily.   Carafate 1 GM/10ML suspension Generic drug: sucralfate TAKE 10ML BY MOUTH 4 (FOUR) TIMES A DAY BETWEEN MEALS AND AT BEDTIME   Dexilant 60 MG capsule Generic drug: dexlansoprazole Take 60 mg by mouth daily. Take 1 tablet by mouth once daily.   Elmiron 100 MG capsule Generic drug: pentosan  polysulfate TAKE 1 CAPSULE (100 MG TOTAL) BY MOUTH 3 TIMES DAILY BEFORE MEALS.   furosemide 40 MG tablet Commonly known as: LASIX Take 40 mg by mouth. Take 1 tablet by mouth three times per week.   gabapentin 300 MG capsule Commonly known as: NEURONTIN Take by mouth.   glucose blood test strip Commonly known as: OneTouch Verio USE AS INSTRUCTED TO CHECK BLOOD SUGAR ONCE A DAY ALTERNATING BETWEEN FASTING GLUCOSE CLECKS AND AFTER MEAL CHECKS.   hydrocortisone 2.5 % ointment APPLY TO AFFECED ARES(S) TOPICALLY TWICE DAILY AS NEEDED   ibandronate 150 MG tablet Commonly known as: BONIVA Take 150 mg by mouth every 30 (thirty) days. Take 1 tablet by mouth once per month.   Insulin Syringe-Needle U-100 25G X 5/8" 1 ML Misc USE DAILY TO INJECT INSULIN   isosorbide mononitrate 120 MG 24 hr tablet Commonly known as: IMDUR Take 120 mg by mouth daily. Take 1 tablet by mouth once daily.   metoprolol succinate 50 MG 24 hr tablet Commonly known as: TOPROL-XL Take 1 tablet (50 mg total) by mouth daily. Take with or immediately following a meal.   Myrbetriq 50 MG Tb24 tablet Generic drug: mirabegron ER Take by mouth.   nitroGLYCERIN 0.4 MG SL tablet Commonly known as: NITROSTAT Place 1 tablet (0.4 mg total) under the tongue every 5 (five) minutes x 3 doses as needed for chest pain.   OneTouch Delica Lancets 74J Misc OneTouch Delica Lancets 33 gauge  TEST BLOOD SUGAR ONCE DAILY   pravastatin 20 MG tablet Commonly known as: PRAVACHOL TAKE ONE TABLET BY MOUTH ONCE DAILY   repaglinide 2 MG tablet Commonly known as: PRANDIN 1 tablet before breakfast and 2 tablets before dinner What changed:   medication strength  how much to take  how to take this  when to take this  additional instructions Changed by: Elayne Snare, MD   sitaGLIPtin 25 MG tablet Commonly known as: Januvia Take 1 tablet (25 mg total) by mouth daily.   Synthroid 75 MCG tablet Generic drug: levothyroxine  TAKE ONE TABLET BY MOUTH ONCE DAILY   triamcinolone ointment 0.1 % Commonly known as: KENALOG APPLY THIN LAYER ON THE SKIN TWICE A DAY FOR 3 WEEKS       Allergies:  Allergies  Allergen Reactions  . Atorvastatin Rash  . Ezetimibe Itching    Past Medical History:  Diagnosis Date  . Anemia in chronic kidney disease  05/01/2015  . Anxiety   . Arthritis   . Bladder irritation   . Diabetes mellitus   . GERD (gastroesophageal reflux disease)   . Hypercholesterolemia   . Hypertension   . Hypothyroidism   . Iron deficiency anemia, unspecified   . Psoriasis   . Transfusion history 03/05/2016   for postoperative (ORIF) anemia superimposed on chronic anemia    Past Surgical History:  Procedure Laterality Date  . ABDOMINAL HYSTERECTOMY     TAH  . BREAST BIOPSY     Left breast biopsy benign lesion  . BREAST BIOPSY  03/11/2012   Procedure: BREAST BIOPSY;  Surgeon: Odis Hollingshead, MD;  Location: [facility];  Service: General;  Laterality: Left;  remove left breast mass  . BREAST EXCISIONAL BIOPSY Left   . CARDIAC CATHETERIZATION N/A 01/02/2015   Procedure: Left Heart Cath and Coronary Angiography;  Surgeon: Charolette Forward, MD;  Location: [facility];  Service: Cardiovascular;  Laterality: N/A;  . CATARACT EXTRACTION    . CERVICAL DISC SURGERY    . COLONOSCOPY  2013   neg. next 2023.   Marland Kitchen EYE SURGERY    . LEFT HIP SURGERY    . TOTAL HIP ARTHROPLASTY Left 03/02/2016   Procedure: TOTAL HIP ARTHROPLASTY ANTERIOR APPROACH;  Surgeon: Rod Can, MD;  Location: [facility];  Service: Orthopedics;  Laterality: Left;    Family History  Problem Relation Age of Onset  . Heart disease Mother        heart attack  . Stroke Father        brain hemorrhage  . Diabetes Brother   . Cancer Brother   . Breast cancer Sister   . Cancer Sister        breast cancer    Social History:  reports that she has never smoked. She has never used smokeless tobacco. She reports that she does not drink  alcohol or use drugs.  Review of Systems:     HYPERTENSION: Mild, this is well-controlled with metoprolol 50 mg only Followed by cardiologist  RENAL dysfunction has been variable and followed by nephrologist every 3 months Not clear why creatinine is higher   No hyperkalemia   She takes Lasix 40 mg for  edema but edema may be worse recently  Lab Results  Component Value Date   CREATININE 2.22 (H) 12/16/2018   CREATININE 1.99 (H) 10/30/2018   CREATININE 1.59 (H) 06/15/2018     HYPERLIPIDEMIA: The lipid abnormality consists of elevated LDL which is mild and well controlled with pravastatin 20 mg LDL consistently below 100 History of possible rash from Lipitor  Lab Results  Component Value Date   CHOL 144 10/30/2018   HDL 44.10 10/30/2018   LDLCALC 68 10/30/2018   TRIG 162.0 (H) 10/30/2018   CHOLHDL 3 10/30/2018    She has a long history of primary HYPOTHYROIDISM   She takes SYNTHROID daily before breakfast with extra tablet once a week  No complaints of fatigue Not taking any iron with her Synthroid Her Synthroid was increased in August when her TSH was 10  TSH back to normal    Lab Results  Component Value Date   TSH 1.58 12/16/2018   TSH 10.00 (H) 10/30/2018   TSH 3.36 03/03/2018   FREET4 1.56 12/16/2018   FREET4 0.98 10/30/2018   FREET4 1.35 12/20/2016     Has been taking gabapentin because of bladder problems as recommended by a urologist a few years ago    Diabetic foot  exam in 12/19: Normal monofilament sensation   OSTEOPENIA: Her T score Previously done by gynecologist was -2.1, currently on Boniva started in 01/2014  Last bone density was -2.6 in 2/18, this was ordered by her gastroenterologist    Examination:   BP 110/62   Pulse (!) 58   Temp 98.1 F (36.7 C)   Wt 125 lb 3.2 oz (56.8 kg)   SpO2 98%   BMI 23.66 kg/m   Body mass index is 23.66 kg/m.    Trace edema present  ASSESSMENT/ PLAN:    Diabetes type 2, Nonobese   See  history of present illness for detailed discussion of current diabetes management, blood sugar patterns and problems identified  Her A1c is last 6.9  Although her blood sugars are averaging 195 most of this is related to postprandial hyperglycemia after dinner However fructosamine of 280 indicates somewhat better overall level of control with increasing Prandin to 2 mg at dinner and continuing low-dose Januvia and acarbose  Discussed that if she does not want to go on insulin she will need to try and increase her Prandin first and also add more protein to her meals such as with yogurt and nuts Recommended that she try to do at least a little walking a few times a day for exercise   She has less hyperglycemia after dinner in the last week and overall average 170 postprandially She does need to continue improving her diet with less carbohydrate and more protein also  New prescription for 2 mg Prandin given, she will take 2 mg before breakfast and 4 mg before dinner May consider mealtime insulin if sugars are consistently high However discussed needing to check blood sugars more often after meals in the morning and some fasting also  RENAL dysfunction: This is again worse Likely may be getting overdiuresis and she can try to take 40 mg Lasix only when her edema is worse  HYPOTHYROIDISM: Better controlled with increasing levothyroxine by 75 mcg weekly  Total visit time for evaluation and management of multiple problems and counseling =25 minutes  Patient Instructions  Prandin 2mg  berfore Bfst and 4mg  before dinner  Lasix 1/2 tab on days swelling is less  More protein at dinner like nuts or yogurt  Check blood sugars on waking up 2 days a week  Also check blood sugars about 2 hours after meals and do this after different meals by rotation  Recommended blood sugar levels on waking up are 90-130 and about 2 hours after meal is 130-160  Please bring your blood sugar monitor to each  visit, thank you     Influenza vaccine given, patient information sheet handed out    Elayne Snare 12/22/2018, 9:01 PM

## 2018-12-22 NOTE — Patient Instructions (Addendum)
Prandin 2mg  berfore Bfst and 4mg  before dinner  Lasix 1/2 tab on days swelling is less  More protein at dinner like nuts or yogurt  Check blood sugars on waking up 2 days a week  Also check blood sugars about 2 hours after meals and do this after different meals by rotation  Recommended blood sugar levels on waking up are 90-130 and about 2 hours after meal is 130-160  Please bring your blood sugar monitor to each visit, thank you

## 2018-12-25 ENCOUNTER — Other Ambulatory Visit: Payer: Self-pay | Admitting: Hematology and Oncology

## 2018-12-25 DIAGNOSIS — D631 Anemia in chronic kidney disease: Secondary | ICD-10-CM

## 2018-12-25 DIAGNOSIS — D539 Nutritional anemia, unspecified: Secondary | ICD-10-CM

## 2018-12-28 ENCOUNTER — Ambulatory Visit: Payer: Medicare Other

## 2018-12-28 ENCOUNTER — Other Ambulatory Visit: Payer: Self-pay | Admitting: Endocrinology

## 2018-12-28 ENCOUNTER — Other Ambulatory Visit: Payer: Medicare Other

## 2018-12-28 ENCOUNTER — Inpatient Hospital Stay: Payer: Medicare Other

## 2018-12-30 ENCOUNTER — Other Ambulatory Visit: Payer: Self-pay | Admitting: Endocrinology

## 2018-12-30 ENCOUNTER — Telehealth: Payer: Self-pay | Admitting: Hematology and Oncology

## 2018-12-30 NOTE — Telephone Encounter (Signed)
Returned patient's phone call regarding rescheduling 10/05 appointment, left a voicemail.

## 2019-01-01 ENCOUNTER — Telehealth: Payer: Self-pay

## 2019-01-01 NOTE — Telephone Encounter (Signed)
-----   Message from [name], MD sent at 01/01/2019  8:16 AM EDT ----- Regarding: recent no show I was not made aware but she did not show up for labs and injection this week Can you call and find out why? Can you help reschedule? I need to order some labs as well I do not need to see her

## 2019-01-01 NOTE — Telephone Encounter (Signed)
Called and left a message asking her to call the office. 

## 2019-01-01 NOTE — Telephone Encounter (Signed)
Called back and gave below message. She said she canceled that appt. She did not miss the appt, she called someone and told them she could not make the appt.  Lab and injection appts scheduled for 10/13, times given. She verbalized understanding.

## 2019-01-01 NOTE — Telephone Encounter (Signed)
Called and spoke with husband. He ask that I call after 10 am to speak with wife.

## 2019-01-04 ENCOUNTER — Other Ambulatory Visit: Payer: Self-pay | Admitting: Endocrinology

## 2019-01-05 ENCOUNTER — Other Ambulatory Visit: Payer: Self-pay

## 2019-01-05 ENCOUNTER — Inpatient Hospital Stay: Payer: Medicare Other | Attending: Hematology and Oncology

## 2019-01-05 ENCOUNTER — Inpatient Hospital Stay: Payer: Medicare Other

## 2019-01-05 VITALS — BP 140/53 | HR 57 | Temp 98.9°F | Resp 18

## 2019-01-05 DIAGNOSIS — Z803 Family history of malignant neoplasm of breast: Secondary | ICD-10-CM | POA: Diagnosis not present

## 2019-01-05 DIAGNOSIS — N183 Chronic kidney disease, stage 3 unspecified: Secondary | ICD-10-CM

## 2019-01-05 DIAGNOSIS — G8929 Other chronic pain: Secondary | ICD-10-CM | POA: Insufficient documentation

## 2019-01-05 DIAGNOSIS — M79644 Pain in right finger(s): Secondary | ICD-10-CM | POA: Diagnosis not present

## 2019-01-05 DIAGNOSIS — E538 Deficiency of other specified B group vitamins: Secondary | ICD-10-CM

## 2019-01-05 DIAGNOSIS — Z79899 Other long term (current) drug therapy: Secondary | ICD-10-CM | POA: Insufficient documentation

## 2019-01-05 DIAGNOSIS — D539 Nutritional anemia, unspecified: Secondary | ICD-10-CM

## 2019-01-05 DIAGNOSIS — N189 Chronic kidney disease, unspecified: Secondary | ICD-10-CM

## 2019-01-05 DIAGNOSIS — D631 Anemia in chronic kidney disease: Secondary | ICD-10-CM

## 2019-01-05 LAB — IRON AND TIBC
Iron: 103 ug/dL (ref 41–142)
Saturation Ratios: 48 % (ref 21–57)
TIBC: 215 ug/dL — ABNORMAL LOW (ref 236–444)
UIBC: 112 ug/dL — ABNORMAL LOW (ref 120–384)

## 2019-01-05 LAB — CBC WITH DIFFERENTIAL/PLATELET
Abs Immature Granulocytes: 0.01 10*3/uL (ref 0.00–0.07)
Basophils Absolute: 0 10*3/uL (ref 0.0–0.1)
Basophils Relative: 0 %
Eosinophils Absolute: 0.5 10*3/uL (ref 0.0–0.5)
Eosinophils Relative: 8 %
HCT: 33.5 % — ABNORMAL LOW (ref 36.0–46.0)
Hemoglobin: 10.1 g/dL — ABNORMAL LOW (ref 12.0–15.0)
Immature Granulocytes: 0 %
Lymphocytes Relative: 28 %
Lymphs Abs: 1.6 10*3/uL (ref 0.7–4.0)
MCH: 26.8 pg (ref 26.0–34.0)
MCHC: 30.1 g/dL (ref 30.0–36.0)
MCV: 88.9 fL (ref 80.0–100.0)
Monocytes Absolute: 0.4 10*3/uL (ref 0.1–1.0)
Monocytes Relative: 6 %
Neutro Abs: 3.4 10*3/uL (ref 1.7–7.7)
Neutrophils Relative %: 58 %
Platelets: 175 10*3/uL (ref 150–400)
RBC: 3.77 MIL/uL — ABNORMAL LOW (ref 3.87–5.11)
RDW: 15.2 % (ref 11.5–15.5)
WBC: 5.9 10*3/uL (ref 4.0–10.5)
nRBC: 0 % (ref 0.0–0.2)

## 2019-01-05 LAB — VITAMIN B12: Vitamin B-12: 486 pg/mL (ref 180–914)

## 2019-01-05 LAB — FERRITIN: Ferritin: 356 ng/mL — ABNORMAL HIGH (ref 11–307)

## 2019-01-05 LAB — SEDIMENTATION RATE: Sed Rate: 28 mm/hr — ABNORMAL HIGH (ref 0–22)

## 2019-01-05 MED ORDER — DARBEPOETIN ALFA 200 MCG/0.4ML IJ SOSY
200.0000 ug | PREFILLED_SYRINGE | Freq: Once | INTRAMUSCULAR | Status: AC
  Administered 2019-01-05: 200 ug via SUBCUTANEOUS

## 2019-01-05 MED ORDER — DARBEPOETIN ALFA 200 MCG/0.4ML IJ SOSY
PREFILLED_SYRINGE | INTRAMUSCULAR | Status: AC
  Filled 2019-01-05: qty 0.4

## 2019-01-05 MED ORDER — CYANOCOBALAMIN 1000 MCG/ML IJ SOLN
INTRAMUSCULAR | Status: AC
  Filled 2019-01-05: qty 1

## 2019-01-05 MED ORDER — CYANOCOBALAMIN 1000 MCG/ML IJ SOLN
1000.0000 ug | Freq: Once | INTRAMUSCULAR | Status: AC
  Administered 2019-01-05: 1000 ug via INTRAMUSCULAR

## 2019-01-05 NOTE — Patient Instructions (Signed)
Cyanocobalamin, Vitamin B12 injection What is this medicine? CYANOCOBALAMIN (sye an oh koe BAL a min) is a man made form of vitamin B12. Vitamin B12 is used in the growth of healthy blood cells, nerve cells, and proteins in the body. It also helps with the metabolism of fats and carbohydrates. This medicine is used to treat people who can not absorb vitamin B12. This medicine may be used for other purposes; ask your health care provider or pharmacist if you have questions. COMMON BRAND NAME(S): B-12 Compliance Kit, B-12 Injection Kit, Cyomin, LA-12, Nutri-Twelve, Physicians EZ Use B-12, Primabalt What should I tell my health care provider before I take this medicine? They need to know if you have any of these conditions:  kidney disease  Leber's disease  megaloblastic anemia  an unusual or allergic reaction to cyanocobalamin, cobalt, other medicines, foods, dyes, or preservatives  pregnant or trying to get pregnant  breast-feeding How should I use this medicine? This medicine is injected into a muscle or deeply under the skin. It is usually given by a health care professional in a clinic or doctor's office. However, your doctor may teach you how to inject yourself. Follow all instructions. Talk to your pediatrician regarding the use of this medicine in children. Special care may be needed. Overdosage: If you think you have taken too much of this medicine contact a poison control center or emergency room at once. NOTE: This medicine is only for you. Do not share this medicine with others. What if I miss a dose? If you are given your dose at a clinic or doctor's office, call to reschedule your appointment. If you give your own injections and you miss a dose, take it as soon as you can. If it is almost time for your next dose, take only that dose. Do not take double or extra doses. What may interact with this medicine?  colchicine  heavy alcohol intake This list may not describe all  possible interactions. Give your health care provider a list of all the medicines, herbs, non-prescription drugs, or dietary supplements you use. Also tell them if you smoke, drink alcohol, or use illegal drugs. Some items may interact with your medicine. What should I watch for while using this medicine? Visit your doctor or health care professional regularly. You may need blood work done while you are taking this medicine. You may need to follow a special diet. Talk to your doctor. Limit your alcohol intake and avoid smoking to get the best benefit. What side effects may I notice from receiving this medicine? Side effects that you should report to your doctor or health care professional as soon as possible:  allergic reactions like skin rash, itching or hives, swelling of the face, lips, or tongue  blue tint to skin  chest tightness, pain  difficulty breathing, wheezing  dizziness  red, swollen painful area on the leg Side effects that usually do not require medical attention (report to your doctor or health care professional if they continue or are bothersome):  diarrhea  headache This list may not describe all possible side effects. Call your doctor for medical advice about side effects. You may report side effects to FDA at 1-800-FDA-1088. Where should I keep my medicine? Keep out of the reach of children. Store at room temperature between 15 and 30 degrees C (59 and 85 degrees F). Protect from light. Throw away any unused medicine after the expiration date. NOTE: This sheet is a summary. It may not cover  all possible information. If you have questions about this medicine, talk to your doctor, pharmacist, or health care provider.  2020 Elsevier/Gold Standard (2007-06-22 22:10:20) Darbepoetin Alfa injection What is this medicine? DARBEPOETIN ALFA (dar be POE e tin AL fa) helps your body make more red blood cells. It is used to treat anemia caused by chronic kidney failure and  chemotherapy. This medicine may be used for other purposes; ask your health care provider or pharmacist if you have questions. COMMON BRAND NAME(S): Aranesp What should I tell my health care provider before I take this medicine? They need to know if you have any of these conditions:  blood clotting disorders or history of blood clots  cancer patient not on chemotherapy  cystic fibrosis  heart disease, such as angina, heart failure, or a history of a heart attack  hemoglobin level of 12 g/dL or greater  high blood pressure  low levels of folate, iron, or vitamin B12  seizures  an unusual or allergic reaction to darbepoetin, erythropoietin, albumin, hamster proteins, latex, other medicines, foods, dyes, or preservatives  pregnant or trying to get pregnant  breast-feeding How should I use this medicine? This medicine is for injection into a vein or under the skin. It is usually given by a health care professional in a hospital or clinic setting. If you get this medicine at home, you will be taught how to prepare and give this medicine. Use exactly as directed. Take your medicine at regular intervals. Do not take your medicine more often than directed. It is important that you put your used needles and syringes in a special sharps container. Do not put them in a trash can. If you do not have a sharps container, call your pharmacist or healthcare provider to get one. A special MedGuide will be given to you by the pharmacist with each prescription and refill. Be sure to read this information carefully each time. Talk to your pediatrician regarding the use of this medicine in children. While this medicine may be used in children as young as 1 month of age for selected conditions, precautions do apply. Overdosage: If you think you have taken too much of this medicine contact a poison control center or emergency room at once. NOTE: This medicine is only for you. Do not share this medicine  with others. What if I miss a dose? If you miss a dose, take it as soon as you can. If it is almost time for your next dose, take only that dose. Do not take double or extra doses. What may interact with this medicine? Do not take this medicine with any of the following medications:  epoetin alfa This list may not describe all possible interactions. Give your health care provider a list of all the medicines, herbs, non-prescription drugs, or dietary supplements you use. Also tell them if you smoke, drink alcohol, or use illegal drugs. Some items may interact with your medicine. What should I watch for while using this medicine? Your condition will be monitored carefully while you are receiving this medicine. You may need blood work done while you are taking this medicine. This medicine may cause a decrease in vitamin B6. You should make sure that you get enough vitamin B6 while you are taking this medicine. Discuss the foods you eat and the vitamins you take with your health care professional. What side effects may I notice from receiving this medicine? Side effects that you should report to your doctor or health care professional   as soon as possible:  allergic reactions like skin rash, itching or hives, swelling of the face, lips, or tongue  breathing problems  changes in vision  chest pain  confusion, trouble speaking or understanding  feeling faint or lightheaded, falls  high blood pressure  muscle aches or pains  pain, swelling, warmth in the leg  rapid weight gain  severe headaches  sudden numbness or weakness of the face, arm or leg  trouble walking, dizziness, loss of balance or coordination  seizures (convulsions)  swelling of the ankles, feet, hands  unusually weak or tired Side effects that usually do not require medical attention (report to your doctor or health care professional if they continue or are bothersome):  diarrhea  fever, chills (flu-like  symptoms)  headaches  nausea, vomiting  redness, stinging, or swelling at site where injected This list may not describe all possible side effects. Call your doctor for medical advice about side effects. You may report side effects to FDA at 1-800-FDA-1088. Where should I keep my medicine? Keep out of the reach of children. Store in a refrigerator between 2 and 8 degrees C (36 and 46 degrees F). Do not freeze. Do not shake. Throw away any unused portion if using a single-dose vial. Throw away any unused medicine after the expiration date. NOTE: This sheet is a summary. It may not cover all possible information. If you have questions about this medicine, talk to your doctor, pharmacist, or health care provider.  2020 Elsevier/Gold Standard (2017-03-26 16:44:20)

## 2019-01-06 LAB — FOLATE RBC
Folate, Hemolysate: 293 ng/mL
Folate, RBC: 918 ng/mL (ref >498)
Hematocrit: 31.9 % — ABNORMAL LOW (ref 34.0–46.6)

## 2019-01-06 LAB — ERYTHROPOIETIN: Erythropoietin: 10 m[IU]/mL (ref 2.6–18.5)

## 2019-01-14 DIAGNOSIS — N649 Disorder of breast, unspecified: Secondary | ICD-10-CM | POA: Diagnosis not present

## 2019-01-16 ENCOUNTER — Other Ambulatory Visit: Payer: Self-pay | Admitting: Endocrinology

## 2019-01-19 DIAGNOSIS — N3010 Interstitial cystitis (chronic) without hematuria: Secondary | ICD-10-CM | POA: Diagnosis not present

## 2019-01-27 ENCOUNTER — Other Ambulatory Visit (INDEPENDENT_AMBULATORY_CARE_PROVIDER_SITE_OTHER): Payer: Medicare Other

## 2019-01-27 ENCOUNTER — Other Ambulatory Visit: Payer: Self-pay

## 2019-01-27 DIAGNOSIS — E1165 Type 2 diabetes mellitus with hyperglycemia: Secondary | ICD-10-CM

## 2019-01-27 DIAGNOSIS — E039 Hypothyroidism, unspecified: Secondary | ICD-10-CM | POA: Diagnosis not present

## 2019-01-28 ENCOUNTER — Other Ambulatory Visit: Payer: Self-pay

## 2019-01-28 LAB — HEMOGLOBIN A1C: Hgb A1c MFr Bld: 6.6 % — ABNORMAL HIGH (ref 4.6–6.5)

## 2019-01-28 LAB — COMPREHENSIVE METABOLIC PANEL
ALT: 20 U/L (ref 0–35)
AST: 24 U/L (ref 0–37)
Albumin: 4.4 g/dL (ref 3.5–5.2)
Alkaline Phosphatase: 85 U/L (ref 39–117)
BUN: 26 mg/dL — ABNORMAL HIGH (ref 6–23)
CO2: 29 mEq/L (ref 19–32)
Calcium: 9.8 mg/dL (ref 8.4–10.5)
Chloride: 102 mEq/L (ref 96–112)
Creatinine, Ser: 1.64 mg/dL — ABNORMAL HIGH (ref 0.40–1.20)
GFR: 30.48 mL/min — ABNORMAL LOW (ref >60.00)
Glucose, Bld: 148 mg/dL — ABNORMAL HIGH (ref 70–99)
Potassium: 4.3 mEq/L (ref 3.5–5.1)
Sodium: 139 mEq/L (ref 135–145)
Total Bilirubin: 0.4 mg/dL (ref 0.2–1.2)
Total Protein: 6.7 g/dL (ref 6.0–8.3)

## 2019-01-28 LAB — TSH: TSH: 1.97 u[IU]/mL (ref 0.35–4.50)

## 2019-01-29 ENCOUNTER — Ambulatory Visit
Admission: RE | Admit: 2019-01-29 | Discharge: 2019-01-29 | Disposition: A | Discharge status: Home / Self Care | Payer: Medicare Other | Source: Ambulatory Visit | Attending: General Surgery | Admitting: General Surgery

## 2019-01-29 DIAGNOSIS — Z1231 Encounter for screening mammogram for malignant neoplasm of breast: Secondary | ICD-10-CM | POA: Diagnosis not present

## 2019-02-01 ENCOUNTER — Ambulatory Visit (INDEPENDENT_AMBULATORY_CARE_PROVIDER_SITE_OTHER): Payer: Medicare Other | Admitting: Endocrinology

## 2019-02-01 ENCOUNTER — Encounter: Payer: Self-pay | Admitting: Endocrinology

## 2019-02-01 VITALS — BP 120/50 | HR 58 | Ht 61.0 in | Wt 127.0 lb

## 2019-02-01 DIAGNOSIS — E039 Hypothyroidism, unspecified: Secondary | ICD-10-CM

## 2019-02-01 DIAGNOSIS — E1165 Type 2 diabetes mellitus with hyperglycemia: Secondary | ICD-10-CM

## 2019-02-01 MED ORDER — INSULIN PEN NEEDLE 31G X 5 MM MISC: 1 refills | Status: DC

## 2019-02-01 MED ORDER — NOVOLOG FLEXPEN 100 UNIT/ML ~~LOC~~ SOPN: PEN_INJECTOR | SUBCUTANEOUS | 1 refills | Status: DC

## 2019-02-01 NOTE — Progress Notes (Signed)
Patient ID: Abigail Hoover, female   DOB: Dec 04, 1945, 73 y.o.   MRN: [id number]    Reason for Appointment:  follow-up of various issues   History of Present Illness   Diagnosis: Type 2 DIABETES MELITUS, long-standing  She has had mild diabetes for several years but has required multiple drugs for control Usually has had postprandial hyperglycemia, this has been better controlled with adding Glyset to her main meals Has never been on insulin or a GLP-1 drug Actos had been stopped previously because of tendency to edema  Recent history:   Oral hypoglycemic drugs:  Precose 50 mg before breakfast and lunch, 100 mg dinner, Prandin 2 mg before breakfast, #4 mg at dinnertime, Januvia  Her A1c is now 6.6, was 6.9  Fructosamine previously was better at 280  Current management, problems identified and blood sugar patterns:  She has been taking the higher doses of Prandin as directed with 2 mg at breakfast and 4 mg dinner  However although her blood sugars are ranging from 178 up to 242 after dinner  She thinks she is trying to watch her diet with cutting back on rice, having yogurt for protein and some lentils  Although she has had a low sugar of 56 this has been now about 3 to 4 weeks ago and otherwise FASTING blood sugars are excellent  She has only a couple of vision readings after breakfast and these are over 200 now  She takes her acarbose and Prandin regularly before meals  Weight is stable to slightly increased  Side effects from medications: None Proper timing of medications in relation to meals: Yes.          Monitors blood glucose: 1 time a day or less .    Glucometer: One Touch        Blood Glucose readings from download    PRE-MEAL Fasting Lunch Dinner Bedtime Overall  Glucose range: 56-139      Mean/median: 95    158   POST-MEAL PC Breakfast PC Lunch PC Dinner  Glucose range:   146-242  Mean/median: 199  183   Previous readings:  PRE-MEAL Fasting  Lunch Dinner Bedtime Overall  Glucose range:  65-180      Mean/median:  109     195   POST-MEAL PC Breakfast PC Lunch PC Dinner  Glucose range:   214, 268  152-301  Mean/median:    210          Meals: 2- 3 meals per day.   Generally eating small breakfast and lunch but with only little protein, is vegetarian   EXERCISE:  None, only activity is housework            Dietician visit: Most recent: None   Wt Readings from Last 3 Encounters:  02/01/19 127 lb (57.6 kg)  12/22/18 125 lb 3.2 oz (56.8 kg)  11/03/18 128 lb 12.8 oz (58.4 kg)   Lab Results  Component Value Date   HGBA1C 6.6 (H) 01/27/2019   HGBA1C 6.9 (H) 10/30/2018   HGBA1C 6.3 03/03/2018   Lab Results  Component Value Date   MICROALBUR 1.0 10/30/2018   LDLCALC 68 10/30/2018   CREATININE 1.64 (H) 01/27/2019      Lab Results  Component Value Date   FRUCTOSAMINE 280 12/16/2018   FRUCTOSAMINE 293 (H) 06/15/2018   FRUCTOSAMINE 269 01/08/2018   FRUCTOSAMINE 266 07/26/2016     OTHER problems Addressed today are in review of systems:    Allergies as  of 02/01/2019      Reactions   Atorvastatin Rash   Ezetimibe Itching      Medication List       Accurate as of February 01, 2019  4:47 PM. If you have any questions, ask your nurse or doctor.        STOP taking these medications   acarbose 50 MG tablet Commonly known as: PRECOSE Stopped by: Elayne Snare, MD   repaglinide 2 MG tablet Commonly known as: PRANDIN Stopped by: Elayne Snare, MD     TAKE these medications   aspirin EC 81 MG tablet Take 81 mg by mouth daily. Take 1 tablet by mouth once daily.   Carafate 1 GM/10ML suspension Generic drug: sucralfate TAKE 10ML BY MOUTH 4 (FOUR) TIMES A DAY BETWEEN MEALS AND AT BEDTIME   Dexilant 60 MG capsule Generic drug: dexlansoprazole Take 60 mg by mouth daily. Take 1 tablet by mouth once daily.   Elmiron 100 MG capsule Generic drug: pentosan polysulfate TAKE 1 CAPSULE (100 MG TOTAL) BY MOUTH 3 TIMES  DAILY BEFORE MEALS.   furosemide 40 MG tablet Commonly known as: LASIX Take 40 mg by mouth. Take 1 tablet by mouth three times per week.   gabapentin 300 MG capsule Commonly known as: NEURONTIN Take by mouth.   glucose blood test strip Commonly known as: OneTouch Verio USE AS INSTRUCTED TO CHECK BLOOD SUGAR ONCE A DAY ALTERNATING BETWEEN FASTING GLUCOSE CLECKS AND AFTER MEAL CHECKS.   hydrocortisone 2.5 % ointment APPLY TO AFFECED ARES(S) TOPICALLY TWICE DAILY AS NEEDED   ibandronate 150 MG tablet Commonly known as: BONIVA Take 150 mg by mouth every 30 (thirty) days. Take 1 tablet by mouth once per month.   Insulin Pen Needle 31G X 5 MM Misc Use with Novolog Started by: Elayne Snare, MD   Insulin Syringe-Needle U-100 25G X 5/8" 1 ML Misc USE DAILY TO INJECT INSULIN   isosorbide mononitrate 120 MG 24 hr tablet Commonly known as: IMDUR Take 120 mg by mouth daily. Take 1 tablet by mouth once daily.   Januvia 25 MG tablet Generic drug: sitaGLIPtin TAKE 1 TABLET (25 MG TOTAL) BY MOUTH DAILY.   metoprolol succinate 50 MG 24 hr tablet Commonly known as: TOPROL-XL Take 1 tablet (50 mg total) by mouth daily. Take with or immediately following a meal.   Myrbetriq 50 MG Tb24 tablet Generic drug: mirabegron ER Take by mouth.   nitroGLYCERIN 0.4 MG SL tablet Commonly known as: NITROSTAT Place 1 tablet (0.4 mg total) under the tongue every 5 (five) minutes x 3 doses as needed for chest pain.   NovoLOG FlexPen 100 UNIT/ML FlexPen Generic drug: insulin aspart Take 3-8 units ac tid as directed Started by: Elayne Snare, MD   OneTouch Delica Lancets 99I Misc OneTouch Delica Lancets 33 gauge  TEST BLOOD SUGAR ONCE DAILY   pravastatin 20 MG tablet Commonly known as: PRAVACHOL TAKE ONE TABLET BY MOUTH ONCE DAILY   Synthroid 75 MCG tablet Generic drug: levothyroxine TAKE ONE TABLET BY MOUTH ONCE DAILY   triamcinolone ointment 0.1 % Commonly known as: KENALOG APPLY THIN LAYER  ON THE SKIN TWICE A DAY FOR 3 WEEKS       Allergies:  Allergies  Allergen Reactions   Atorvastatin Rash   Ezetimibe Itching    Past Medical History:  Diagnosis Date   Anemia in chronic kidney disease 05/01/2015   Anxiety    Arthritis    Bladder irritation    Diabetes mellitus  GERD (gastroesophageal reflux disease)    Hypercholesterolemia    Hypertension    Hypothyroidism    Iron deficiency anemia, unspecified    Psoriasis    Transfusion history 03/05/2016   for postoperative (ORIF) anemia superimposed on chronic anemia    Past Surgical History:  Procedure Laterality Date   ABDOMINAL HYSTERECTOMY     TAH   BREAST BIOPSY     Left breast biopsy benign lesion   BREAST BIOPSY  03/11/2012   Procedure: BREAST BIOPSY;  Surgeon: Odis Hollingshead, MD;  Location: [place];  Service: General;  Laterality: Left;  remove left breast mass   BREAST EXCISIONAL BIOPSY Left    CARDIAC CATHETERIZATION N/A 01/02/2015   Procedure: Left Heart Cath and Coronary Angiography;  Surgeon: Charolette Forward, MD;  Location: [facility] [facility];  Service: Cardiovascular;  Laterality: N/A;   CATARACT EXTRACTION     CERVICAL DISC SURGERY     COLONOSCOPY  2013   neg. next 2023.    EYE SURGERY     LEFT HIP SURGERY     TOTAL HIP ARTHROPLASTY Left 03/02/2016   Procedure: TOTAL HIP ARTHROPLASTY ANTERIOR APPROACH;  Surgeon: Rod Can, MD;  Location: [place];  Service: Orthopedics;  Laterality: Left;    Family History  Problem Relation Age of Onset   Heart disease Mother        heart attack   Stroke Father        brain hemorrhage   Diabetes Brother    Cancer Brother    Breast cancer Sister    Cancer Sister        breast cancer    Social History:  reports that she has never smoked. She has never used smokeless tobacco. She reports that she does not drink alcohol or use drugs.  Review of Systems:     HYPERTENSION: Mild, this is well-controlled with metoprolol 50 mg  only Followed by cardiologist  RENAL dysfunction has been variable and followed by nephrologist every 3 months   No hyperkalemia   She takes Lasix 40 mg for  edema from nephrologist  Lab Results  Component Value Date   CREATININE 1.64 (H) 01/27/2019   CREATININE 2.22 (H) 12/16/2018   CREATININE 1.99 (H) 10/30/2018     HYPERLIPIDEMIA: The lipid abnormality consists of elevated LDL which is mild and well controlled with pravastatin 20 mg LDL consistently below 100 History of possible rash from Lipitor  Lab Results  Component Value Date   CHOL 144 10/30/2018   HDL 44.10 10/30/2018   LDLCALC 68 10/30/2018   TRIG 162.0 (H) 10/30/2018   CHOLHDL 3 10/30/2018    She has a long history of primary HYPOTHYROIDISM   She takes SYNTHROID daily before breakfast with extra tablet once a week  No complaints of fatigue Not taking any iron with her Synthroid Her Synthroid was increased in August when her TSH was 10  TSH again normal    Lab Results  Component Value Date   TSH 1.97 01/27/2019   TSH 1.58 12/16/2018   TSH 10.00 (H) 10/30/2018   FREET4 1.56 12/16/2018   FREET4 0.98 10/30/2018   FREET4 1.35 12/20/2016     Has been taking gabapentin because of bladder problems as recommended by a urologist a few years ago    Diabetic foot exam in 12/19: Normal monofilament sensation   OSTEOPENIA: Her T score Previously done by gynecologist was -2.1, currently on Boniva started in 01/2014  Last bone density was -2.6 in 2/18,  this was ordered by her gastroenterologist    Examination:   BP (!) 120/50 (BP Location: Left Arm, Patient Position: Sitting, Cuff Size: Normal)    Pulse (!) 58    Ht 5\' 1"  (1.549 m)    Wt 127 lb (57.6 kg)    SpO2 97%    BMI 24.00 kg/m   Body mass index is 24 kg/m.      ASSESSMENT/ PLAN:    Diabetes type 2, Nonobese   See history of present illness for detailed discussion of current diabetes management, blood sugar patterns and problems  identified  Her A1c is 6.6, was 6.9  She continues to have variable amounts of postprandial hyperglycemia Overall in the last 30 days her blood sugars at night are averaging 183 although recently closer to 200 This is despite taking given 4 mg of Prandin at dinnertime and improving her diet Also not appearing to be benefiting from acarbose Also recent readings after dinner are going up to over 200 She is now agreeable to starting insulin and she is concerned about high readings after dinner Fasting readings are fairly good and will only need to be treated with mealtime shots  Today discussed in detail the need for mealtime insulin to cover postprandial spikes, action of mealtime insulin, use of the insulin pen, timing and action of the rapid acting insulin as well as starting dose and dosage titration to target the two-hour reading of under 160  Given her written detailed instructions on this She will start 3 units before breakfast and 5 units before dinner She will make changes in steps of 1 unit to keep her blood sugars within the target range and reduce the dose if blood sugars are below 120 after meals Since she is likely insulin deficient she can stop taking acarbose and Prandin  RENAL dysfunction: This is recently better and likely improved with cutting back on her Lasix    HYPOTHYROIDISM: Now controlled with increasing levothyroxine by 75 mcg weekly     Patient Instructions  Start Novolog before meals as on sheet  Check blood sugars on waking up 3 days a week  Also check blood sugars about 2 hours after meals and do this after different meals by rotation  Recommended blood sugar levels on waking up are 90-130 and about 2 hours after meal is 130-160  Please bring your blood sugar monitor to each visit, thank you       Elayne Snare 02/01/2019, 4:47 PM

## 2019-02-01 NOTE — Patient Instructions (Signed)
Start Novolog before meals as on sheet  Check blood sugars on waking up 3 days a week  Also check blood sugars about 2 hours after meals and do this after different meals by rotation  Recommended blood sugar levels on waking up are 90-130 and about 2 hours after meal is 130-160  Please bring your blood sugar monitor to each visit, thank you

## 2019-02-02 DIAGNOSIS — N184 Chronic kidney disease, stage 4 (severe): Secondary | ICD-10-CM | POA: Diagnosis not present

## 2019-02-04 DIAGNOSIS — I129 Hypertensive chronic kidney disease with stage 1 through stage 4 chronic kidney disease, or unspecified chronic kidney disease: Secondary | ICD-10-CM | POA: Diagnosis not present

## 2019-02-04 DIAGNOSIS — D631 Anemia in chronic kidney disease: Secondary | ICD-10-CM | POA: Diagnosis not present

## 2019-02-04 DIAGNOSIS — N184 Chronic kidney disease, stage 4 (severe): Secondary | ICD-10-CM | POA: Diagnosis not present

## 2019-02-04 DIAGNOSIS — N2581 Secondary hyperparathyroidism of renal origin: Secondary | ICD-10-CM | POA: Diagnosis not present

## 2019-02-05 ENCOUNTER — Telehealth: Payer: Self-pay

## 2019-02-05 ENCOUNTER — Other Ambulatory Visit: Payer: Self-pay

## 2019-02-05 MED ORDER — INSULIN PEN NEEDLE 31G X 5 MM MISC: 1 refills | Status: DC

## 2019-02-05 MED ORDER — NOVOLOG FLEXPEN 100 UNIT/ML ~~LOC~~ SOPN: PEN_INJECTOR | SUBCUTANEOUS | 1 refills | Status: DC

## 2019-02-05 MED ORDER — ONETOUCH VERIO VI STRP: ORAL_STRIP | 8 refills | Status: DC

## 2019-02-05 NOTE — Telephone Encounter (Signed)
Patient needs script, and lancets   glucose blood (ONETOUCH VERIO) test strip  insulin aspart (NOVOLOG FLEXPEN) 100 UNIT/ML FlexPen  Insulin Pen Needle 31G X 5 MM MISC

## 2019-02-05 NOTE — Telephone Encounter (Signed)
Rx sent 

## 2019-02-09 ENCOUNTER — Telehealth: Payer: Self-pay

## 2019-02-09 ENCOUNTER — Other Ambulatory Visit: Payer: Self-pay

## 2019-02-09 MED ORDER — ONETOUCH VERIO VI STRP: ORAL_STRIP | 8 refills | Status: DC

## 2019-02-09 NOTE — Telephone Encounter (Signed)
Rx sent 

## 2019-02-09 NOTE — Telephone Encounter (Signed)
MEDICATION: glucose blood (ONETOUCH VERIO) test strip  PHARMACY:  [facility] [phone] - [place], [place] - [address]  IS THIS A 90 DAY SUPPLY :   IS PATIENT OUT OF MEDICATION:   IF NOT; HOW MUCH IS LEFT:   LAST APPOINTMENT DATE: @11 /13/2020  NEXT APPOINTMENT DATE:@12 /14/2020  DO WE HAVE YOUR PERMISSION TO LEAVE A DETAILED MESSAGE:  OTHER COMMENTS:    **Let patient know to contact pharmacy at the end of the day to make sure medication is ready. **  ** Please notify patient to allow 48-72 hours to process**  **Encourage patient to contact the pharmacy for refills or they can request refills through [facility]**

## 2019-02-10 ENCOUNTER — Other Ambulatory Visit: Payer: Self-pay

## 2019-02-10 MED ORDER — NOVOLOG FLEXPEN 100 UNIT/ML ~~LOC~~ SOPN: PEN_INJECTOR | SUBCUTANEOUS | 1 refills | Status: DC

## 2019-02-15 ENCOUNTER — Other Ambulatory Visit: Payer: Self-pay

## 2019-02-15 ENCOUNTER — Telehealth: Payer: Self-pay | Admitting: Endocrinology

## 2019-02-15 MED ORDER — ONETOUCH VERIO VI STRP: ORAL_STRIP | 8 refills | Status: DC

## 2019-02-15 MED ORDER — NOVOLOG FLEXPEN 100 UNIT/ML ~~LOC~~ SOPN: PEN_INJECTOR | SUBCUTANEOUS | 1 refills | Status: DC

## 2019-02-15 NOTE — Telephone Encounter (Signed)
Rx sent 

## 2019-02-15 NOTE — Telephone Encounter (Signed)
MEDICATION: Novolog, OneTouch Verios Test strips  PHARMACY:  [facility] on [address]  IS THIS A 90 DAY SUPPLY :   IS PATIENT OUT OF MEDICATION: yes  IF NOT; HOW MUCH IS LEFT:   LAST APPOINTMENT DATE: @11 /18/2020  NEXT APPOINTMENT DATE:@12 /14/2020  DO WE HAVE YOUR PERMISSION TO LEAVE A DETAILED MESSAGE:   OTHER COMMENTS: RX's need to be updated to reflect current dosing and testing instructions.  Abigail Hoover states that Abigail Hoover is using his test strips and his insulin now.      **Let patient know to contact pharmacy at the end of the day to make sure medication is ready. **  ** Please notify patient to allow 48-72 hours to process**  **Encourage patient to contact the pharmacy for refills or they can request refills through [facility]

## 2019-02-22 ENCOUNTER — Telehealth: Payer: Self-pay

## 2019-02-22 NOTE — Telephone Encounter (Signed)
PA initiated via [email or web address] for [employer] strips.   [name] Key: [id number] - PA Case ID: [id number] - Rx #: [id number] Need help? Call us at [phone] Status Sent to [place] strips Form [facility] Electronic PA Form Original Claim Info [phone]    Outcome Deniedtoday [employer] denied your request under your Prescription Drug benefit (Medicare Part D). [employer] follows Medicare rules. The Medicare rule in Chapter 6 of the Prescription Drug Manual says that items covered under the Part B benefit cannot be covered under Part D. The information we have says you need medical supplies that are not for injecting insulin. The Medicare Claims Processing Manual (Chapter 20) says Medicare Part B pays for supplies not associated with insulin delivery, such as testing supplies (e.g. blood glucose meters, control solution, test strips and lancets) or needle disposal systems, and for syringes not used for insulin. You only have Part D coverage with [employer]. Your supplies may be covered under your Part B Medicare benefit. Please ask your Medicare Part B plan. If you think Medicare Part D should have paid, you may appeal.

## 2019-02-24 DIAGNOSIS — I25111 Atherosclerotic heart disease of native coronary artery with angina pectoris with documented spasm: Secondary | ICD-10-CM | POA: Diagnosis not present

## 2019-02-24 DIAGNOSIS — I872 Venous insufficiency (chronic) (peripheral): Secondary | ICD-10-CM | POA: Diagnosis not present

## 2019-02-24 DIAGNOSIS — E559 Vitamin D deficiency, unspecified: Secondary | ICD-10-CM | POA: Diagnosis not present

## 2019-02-24 DIAGNOSIS — E119 Type 2 diabetes mellitus without complications: Secondary | ICD-10-CM | POA: Diagnosis not present

## 2019-02-24 DIAGNOSIS — E785 Hyperlipidemia, unspecified: Secondary | ICD-10-CM | POA: Diagnosis not present

## 2019-02-24 DIAGNOSIS — E039 Hypothyroidism, unspecified: Secondary | ICD-10-CM | POA: Diagnosis not present

## 2019-02-24 DIAGNOSIS — I10 Essential (primary) hypertension: Secondary | ICD-10-CM | POA: Diagnosis not present

## 2019-03-01 ENCOUNTER — Inpatient Hospital Stay: Payer: Medicare Other

## 2019-03-01 ENCOUNTER — Inpatient Hospital Stay: Payer: Medicare Other | Attending: Hematology and Oncology | Admitting: Hematology and Oncology

## 2019-03-02 ENCOUNTER — Telehealth: Payer: Self-pay | Admitting: Hematology and Oncology

## 2019-03-02 NOTE — Telephone Encounter (Signed)
Called pt per 12/7 sch message to reschedule missed appt . Called both numbers no answer - left message for patient to call back to reschedule missed appts.

## 2019-03-08 ENCOUNTER — Other Ambulatory Visit: Payer: Self-pay

## 2019-03-08 ENCOUNTER — Encounter: Payer: Self-pay | Admitting: Endocrinology

## 2019-03-08 ENCOUNTER — Ambulatory Visit (INDEPENDENT_AMBULATORY_CARE_PROVIDER_SITE_OTHER): Payer: Medicare Other | Admitting: Endocrinology

## 2019-03-08 VITALS — BP 130/68 | HR 72 | Ht 61.0 in | Wt 136.8 lb

## 2019-03-08 DIAGNOSIS — E1165 Type 2 diabetes mellitus with hyperglycemia: Secondary | ICD-10-CM | POA: Diagnosis not present

## 2019-03-08 DIAGNOSIS — E039 Hypothyroidism, unspecified: Secondary | ICD-10-CM | POA: Diagnosis not present

## 2019-03-08 DIAGNOSIS — R600 Localized edema: Secondary | ICD-10-CM | POA: Diagnosis not present

## 2019-03-08 NOTE — Patient Instructions (Addendum)
Breakfast 10 units, dinner 8 units. Take 4-5 units at snack  Check blood sugars on waking up 4-5 days a week  Also check blood sugars about 2 hours after meals and do this after different meals by rotation  Recommended blood sugar levels on waking up are 90-130 and about 2 hours after meal is 130-160  Please bring your blood sugar monitor to each visit, thank you  januvia at dinner  Lasix 40mg  daily

## 2019-03-08 NOTE — Progress Notes (Signed)
Patient ID: Abigail Hoover, female   DOB: 13-May-1945, 73 y.o.   MRN: [id number]    Reason for Appointment:  follow-up of various issues   History of Present Illness   Diagnosis: Type 2 DIABETES MELITUS, long-standing  She has had mild diabetes for several years but has required multiple drugs for control Usually has had postprandial hyperglycemia, this has been better controlled with adding Glyset to her main meals Has never been on insulin or a GLP-1 drug Actos had been stopped previously because of tendency to edema  Recent history: NovoLog 10 units before breakfast and dinner, 5 to 6 units for afternoon snack   Oral hypoglycemic drugs: None, prescribed Januvia 25 mg daily  INSULIN regimen: NovoLog 10 units before breakfast and dinner, 5 to 6 units before snack  Her A1c is last 6.6, was 6.9  Fructosamine previously was at 280  Current management, problems identified and blood sugar patterns:  She has been taking NovoLog since her last visit in 11/20  This was because of inadequate control with Prandin and Precose  Also patient insists that her blood sugars are always high even though they were only averaging about 158 on the last visit  She was told to start with 3 to 4 units of NovoLog but she has continued to increase the dose up to 10 units at breakfast and dinnertime  She usually does not have lunch but has a carbohydrate snack in the late afternoon  She was also told to continue with Januvia but she has not done so  FASTING blood sugars are relatively high with stopping Prandin and Januvia; however they are inconsistent  HIGHEST blood sugars are sporadically midday are afternoon and occasionally in the morning  She is however having HYPOGLYCEMIA periodically before or after dinnertime  Currently even though she is eating a slightly larger meal at dinnertime compared to breakfast her LOWEST readings are after dinner and averaging only 80  She still did not  get much protein with most of her meals and is eating vegetarian food  Side effects from medications: None Proper timing of medications in relation to meals: Yes.          Monitors blood glucose: 1 time a day or less .    Glucometer: One Touch        Blood Glucose readings from download    PRE-MEAL Fasting Lunch Dinner Bedtime Overall  Glucose range:  126-280  109-226  60-220   60-280  Mean/median:  168  163  148  135   POST-MEAL PC Breakfast PC Lunch PC Dinner  Glucose range:  122, 154   63-122  Mean/median:    80   PREVIOUS readings   PRE-MEAL Fasting Lunch Dinner Bedtime Overall  Glucose range: 56-139      Mean/median: 95    158   POST-MEAL PC Breakfast PC Lunch PC Dinner  Glucose range:   146-242  Mean/median: 199  183        Meals: 2- 3 meals per day.   Generally eating small breakfast and lunch but with only little protein, is vegetarian   EXERCISE:  None, only activity is housework            Dietician visit: Most recent: None   Wt Readings from Last 3 Encounters:  03/08/19 136 lb 12.8 oz (62.1 kg)  02/01/19 127 lb (57.6 kg)  12/22/18 125 lb 3.2 oz (56.8 kg)   Lab Results  Component Value Date  HGBA1C 6.6 (H) 01/27/2019   HGBA1C 6.9 (H) 10/30/2018   HGBA1C 6.3 03/03/2018   Lab Results  Component Value Date   MICROALBUR 1.0 10/30/2018   LDLCALC 68 10/30/2018   CREATININE 1.64 (H) 01/27/2019      Lab Results  Component Value Date   FRUCTOSAMINE 280 12/16/2018   FRUCTOSAMINE 293 (H) 06/15/2018   FRUCTOSAMINE 269 01/08/2018   FRUCTOSAMINE 266 07/26/2016     OTHER problems Addressed today are in review of systems:    Allergies as of 03/08/2019      Reactions   Atorvastatin Rash   Ezetimibe Itching      Medication List       Accurate as of March 08, 2019  3:49 PM. If you have any questions, ask your nurse or doctor.        aspirin EC 81 MG tablet Take 81 mg by mouth daily. Take 1 tablet by mouth once daily.   Carafate 1 GM/10ML  suspension Generic drug: sucralfate TAKE 10ML BY MOUTH 4 (FOUR) TIMES A DAY BETWEEN MEALS AND AT BEDTIME   Dexilant 60 MG capsule Generic drug: dexlansoprazole Take 60 mg by mouth daily. Take 1 tablet by mouth once daily.   Elmiron 100 MG capsule Generic drug: pentosan polysulfate TAKE 1 CAPSULE (100 MG TOTAL) BY MOUTH 3 TIMES DAILY BEFORE MEALS.   furosemide 40 MG tablet Commonly known as: LASIX Take 40 mg by mouth. Take 1 tablet by mouth three times per week.   gabapentin 300 MG capsule Commonly known as: NEURONTIN Take by mouth.   hydrocortisone 2.5 % ointment APPLY TO AFFECED ARES(S) TOPICALLY TWICE DAILY AS NEEDED   ibandronate 150 MG tablet Commonly known as: BONIVA Take 150 mg by mouth every 30 (thirty) days. Take 1 tablet by mouth once per month.   Insulin Pen Needle 31G X 5 MM Misc Use with Novolog   Insulin Syringe-Needle U-100 25G X 5/8" 1 ML Misc USE DAILY TO INJECT INSULIN   isosorbide mononitrate 120 MG 24 hr tablet Commonly known as: IMDUR Take 120 mg by mouth daily. Take 1 tablet by mouth once daily.   Januvia 25 MG tablet Generic drug: sitaGLIPtin TAKE 1 TABLET (25 MG TOTAL) BY MOUTH DAILY.   metoprolol succinate 50 MG 24 hr tablet Commonly known as: TOPROL-XL Take 1 tablet (50 mg total) by mouth daily. Take with or immediately following a meal.   Myrbetriq 50 MG Tb24 tablet Generic drug: mirabegron ER Take by mouth.   nitroGLYCERIN 0.4 MG SL tablet Commonly known as: NITROSTAT Place 1 tablet (0.4 mg total) under the tongue every 5 (five) minutes x 3 doses as needed for chest pain.   NovoLOG FlexPen 100 UNIT/ML FlexPen Generic drug: insulin aspart Take 8 units ac tid as directed   OneTouch Delica Lancets 80H Misc OneTouch Delica Lancets 33 gauge  TEST BLOOD SUGAR ONCE DAILY   OneTouch Verio test strip Generic drug: glucose blood USE AS INSTRUCTED TO CHECK BLOOD SUGAR ONCE A DAY ALTERNATING BETWEEN FASTING GLUCOSE CLECKS AND AFTER MEAL  CHECKS.   pravastatin 20 MG tablet Commonly known as: PRAVACHOL TAKE ONE TABLET BY MOUTH ONCE DAILY   Synthroid 75 MCG tablet Generic drug: levothyroxine TAKE ONE TABLET BY MOUTH ONCE DAILY   triamcinolone ointment 0.1 % Commonly known as: KENALOG APPLY THIN LAYER ON THE SKIN TWICE A DAY FOR 3 WEEKS       Allergies:  Allergies  Allergen Reactions  . Atorvastatin Rash  . Ezetimibe Itching  Past Medical History:  Diagnosis Date  . 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 Anxiety   . Arthritis   . Bladder irritation   . Diabetes mellitus   . GERD (gastroesophageal reflux disease)   . Hypercholesterolemia   . Hypertension   . Hypothyroidism   . Iron deficiency anemia, unspecified   . Psoriasis   . Transfusion history 03/05/2016   for postoperative (ORIF) anemia superimposed on chronic anemia    Past Surgical History:  Procedure Laterality Date  . ABDOMINAL HYSTERECTOMY     TAH  . BREAST BIOPSY     Left breast biopsy benign lesion  . BREAST BIOPSY  03/11/2012   Procedure: BREAST BIOPSY;  Surgeon: Odis Hollingshead, MD;  Location: [facility];  Service: General;  Laterality: Left;  remove left breast mass  . BREAST EXCISIONAL BIOPSY Left   . CARDIAC CATHETERIZATION N/A 01/02/2015   Procedure: Left Heart Cath and Coronary Angiography;  Surgeon: Charolette Forward, MD;  Location: [facility];  Service: Cardiovascular;  Laterality: N/A;  . CATARACT EXTRACTION    . CERVICAL DISC SURGERY    . COLONOSCOPY  2013   neg. next 2023.   Marland Kitchen EYE SURGERY    . LEFT HIP SURGERY    . TOTAL HIP ARTHROPLASTY Left 03/02/2016   Procedure: TOTAL HIP ARTHROPLASTY ANTERIOR APPROACH;  Surgeon: Rod Can, MD;  Location: [place];  Service: Orthopedics;  Laterality: Left;    Family History  Problem Relation Age of Onset  . Heart disease Mother        heart attack  . Stroke Father        brain hemorrhage  . Diabetes Brother   . Cancer Brother   . Breast cancer Sister   . Cancer  Sister        breast cancer    Social History:  reports that she has never smoked. She has never used smokeless tobacco. She reports that she does not drink alcohol or use drugs.  Review of Systems:     HYPERTENSION: Mild, this is well-controlled with metoprolol 50 mg only Followed by cardiologist  RENAL dysfunction has been variable and followed by nephrologist every 3 months No hyperkalemia   She takes Lasix 40 mg, half tablet for  edema from nephrologist Previously dose was reduced since she had less edema and creatinine was increasing  Lab Results  Component Value Date   CREATININE 1.64 (H) 01/27/2019   CREATININE 2.22 (H) 12/16/2018   CREATININE 1.99 (H) 10/30/2018     HYPERLIPIDEMIA: The lipid abnormality consists of elevated LDL which is mild and well controlled with pravastatin 20 mg LDL consistently below 100 History of possible rash from Lipitor  Lab Results  Component Value Date   CHOL 144 10/30/2018   HDL 44.10 10/30/2018   LDLCALC 68 10/30/2018   TRIG 162.0 (H) 10/30/2018   CHOLHDL 3 10/30/2018    She has a long history of primary HYPOTHYROIDISM   She takes SYNTHROID daily before breakfast with extra tablet once a week  No complaints of fatigue Not taking any iron with her Synthroid Her Synthroid was increased in August when her TSH was 10  TSH last normal    Lab Results  Component Value Date   TSH 1.97 01/27/2019   TSH 1.58 12/16/2018   TSH 10.00 (H) 10/30/2018   FREET4 1.56 12/16/2018   FREET4 0.98 10/30/2018   FREET4 1.35 12/20/2016     Has been taking gabapentin because of bladder  problems as recommended by a urologist a few years ago    Diabetic foot exam in 12/19: Normal monofilament sensation   OSTEOPENIA: Her T score Previously done by gynecologist was -2.1, currently on Boniva started in 01/2014  Last bone density was -2.6 in 2/18, this was ordered by her gastroenterologist    Examination:   BP 130/68 (BP Location: Left  Arm, Patient Position: Sitting, Cuff Size: Normal)   Pulse 72   Ht 5\' 1"  (1.549 m)   Wt 136 lb 12.8 oz (62.1 kg)   SpO2 98%   BMI 25.85 kg/m   Body mass index is 25.85 kg/m.    2-3+ lower leg edema present  ASSESSMENT/ PLAN:    Diabetes type 2, Nonobese   See history of present illness for detailed discussion of current diabetes management, blood sugar patterns and problems identified  Her A1c is last 6.6, no recent labs available  Patient has started mealtime insulin instead of Prandin and Precose She primarily had hypoglycemia after dinner with baseline average about 180 Although she is overall doing better with her mealtime insulin regimen with NovoLog she has increased the doses progressively on her own without targeting her postprandial readings  Appears to be getting excessive insulin to cover her evening meal even though this is her main meal and blood sugars are occasionally low Fasting readings are higher likely to be from not taking Prandin Again not getting protein consistently with meals and this may be causing the variability in her blood sugars during the day Not clear why her fasting readings fluctuate, she usually does not have late night snacks   Recommendations:  She will be given a new prescription for test strips to check more often  May also potentially be a candidate for freestyle libre although not clear if he can understand how to use this; currently checking blood sugars 4.4 times a day  Discussed that she is getting excessive insulin at dinnertime and she will reduce the NovoLog to at least 8 units and possibly less  She will also only try to take 3 to 4 units for her snacks and she does not eat a large meal and occasionally with the late afternoon dose her blood sugars are as low as 60 before dinnertime  Restart Januvia as this may reduce blood sugar variability and may have improved fasting readings also, continue 25 mg dose for now and will adjust  based on renal function on the next visit  To call if she is getting hypoglycemia more consistently  Encouraged her to walk more frequently even if it is indoors  RENAL dysfunction: This was improved with reducing Lasix  However she is getting significant insulin edema and needs to go back to at least 40 mg of Lasix, currently taking 20 mg If needed she can go up to 60 mg  Counseling time on subjects discussed in assessment and plan sections is over 50% of today's 25 minute visit      There are no Patient Instructions on file for this visit.    Elayne Snare 03/08/2019, 3:49 PM

## 2019-03-11 ENCOUNTER — Other Ambulatory Visit: Payer: Self-pay | Admitting: Endocrinology

## 2019-03-16 ENCOUNTER — Telehealth: Payer: Self-pay

## 2019-03-16 ENCOUNTER — Other Ambulatory Visit: Payer: Self-pay

## 2019-03-16 MED ORDER — NOVOLOG FLEXPEN 100 UNIT/ML ~~LOC~~ SOPN: PEN_INJECTOR | SUBCUTANEOUS | 1 refills | Status: DC

## 2019-03-16 NOTE — Telephone Encounter (Signed)
Please advise. This is not what your last office visit indicates.

## 2019-03-16 NOTE — Telephone Encounter (Signed)
Pt called requesting refill of the following:  insulin aspart (NOVOLOG FLEXPEN) 100 UNIT/ML FlexPen 15 mL 1 02/15/2019    Sig: Take 8 units ac tid as directed   Sent to pharmacy as: insulin aspart (NOVOLOG FLEXPEN) 100 UNIT/ML FlexPen   E-Prescribing Status: Receipt confirmed by pharmacy (02/15/2019 4:01 PM EST)    Pt states she no longer takes her medication this way and is running out. States she is taking Rx as follows:  12 units in the morning, 4-5 units in the afternoon and 12 units in the evening. Requesting refill.

## 2019-03-16 NOTE — Telephone Encounter (Signed)
Okay to increase the dosage and send a new prescription on the NovoLog but still does not give her more than 5 pens per month

## 2019-03-16 NOTE — Telephone Encounter (Signed)
Noted  

## 2019-03-17 ENCOUNTER — Encounter (INDEPENDENT_AMBULATORY_CARE_PROVIDER_SITE_OTHER): Payer: Medicare Other | Admitting: Ophthalmology

## 2019-03-17 DIAGNOSIS — E113393 Type 2 diabetes mellitus with moderate nonproliferative diabetic retinopathy without macular edema, bilateral: Secondary | ICD-10-CM

## 2019-03-17 DIAGNOSIS — E11319 Type 2 diabetes mellitus with unspecified diabetic retinopathy without macular edema: Secondary | ICD-10-CM | POA: Diagnosis not present

## 2019-03-17 DIAGNOSIS — H35033 Hypertensive retinopathy, bilateral: Secondary | ICD-10-CM | POA: Diagnosis not present

## 2019-03-17 DIAGNOSIS — H43813 Vitreous degeneration, bilateral: Secondary | ICD-10-CM

## 2019-03-17 DIAGNOSIS — I10 Essential (primary) hypertension: Secondary | ICD-10-CM | POA: Diagnosis not present

## 2019-03-25 ENCOUNTER — Other Ambulatory Visit: Payer: Self-pay | Admitting: Endocrinology

## 2019-04-08 ENCOUNTER — Other Ambulatory Visit: Payer: Self-pay | Admitting: Endocrinology

## 2019-04-20 DIAGNOSIS — R102 Pelvic and perineal pain: Secondary | ICD-10-CM | POA: Diagnosis not present

## 2019-04-20 DIAGNOSIS — R3915 Urgency of urination: Secondary | ICD-10-CM | POA: Diagnosis not present

## 2019-04-20 DIAGNOSIS — R350 Frequency of micturition: Secondary | ICD-10-CM | POA: Diagnosis not present

## 2019-04-20 DIAGNOSIS — N3010 Interstitial cystitis (chronic) without hematuria: Secondary | ICD-10-CM | POA: Diagnosis not present

## 2019-04-22 ENCOUNTER — Telehealth: Payer: Self-pay | Admitting: Hematology and Oncology

## 2019-04-22 NOTE — Telephone Encounter (Signed)
Returned patient's phone call regarding rescheduling 12/07 appointment, per patient's request appointment has moved to 02/09.

## 2019-04-29 ENCOUNTER — Other Ambulatory Visit: Payer: Self-pay

## 2019-04-29 ENCOUNTER — Other Ambulatory Visit (INDEPENDENT_AMBULATORY_CARE_PROVIDER_SITE_OTHER): Payer: Medicare Other

## 2019-04-29 DIAGNOSIS — E1165 Type 2 diabetes mellitus with hyperglycemia: Secondary | ICD-10-CM | POA: Diagnosis not present

## 2019-04-29 DIAGNOSIS — E039 Hypothyroidism, unspecified: Secondary | ICD-10-CM

## 2019-04-29 LAB — COMPREHENSIVE METABOLIC PANEL
ALT: 10 U/L (ref 0–35)
AST: 17 U/L (ref 0–37)
Albumin: 4 g/dL (ref 3.5–5.2)
Alkaline Phosphatase: 68 U/L (ref 39–117)
BUN: 29 mg/dL — ABNORMAL HIGH (ref 6–23)
CO2: 30 mEq/L (ref 19–32)
Calcium: 9.5 mg/dL (ref 8.4–10.5)
Chloride: 101 mEq/L (ref 96–112)
Creatinine, Ser: 1.66 mg/dL — ABNORMAL HIGH (ref 0.40–1.20)
GFR: 30.03 mL/min — ABNORMAL LOW (ref >60.00)
Glucose, Bld: 118 mg/dL — ABNORMAL HIGH (ref 70–99)
Potassium: 4.1 mEq/L (ref 3.5–5.1)
Sodium: 138 mEq/L (ref 135–145)
Total Bilirubin: 0.3 mg/dL (ref 0.2–1.2)
Total Protein: 6.4 g/dL (ref 6.0–8.3)

## 2019-04-29 LAB — TSH: TSH: 2.56 u[IU]/mL (ref 0.35–4.50)

## 2019-04-29 LAB — T4, FREE: Free T4: 1.28 ng/dL (ref 0.60–1.60)

## 2019-04-29 LAB — HEMOGLOBIN A1C: Hgb A1c MFr Bld: 7.7 % — ABNORMAL HIGH (ref 4.6–6.5)

## 2019-05-03 ENCOUNTER — Telehealth: Payer: Self-pay | Admitting: Hematology and Oncology

## 2019-05-03 NOTE — Telephone Encounter (Signed)
Rescheduled per 2/5 sch msg, pt req. Called and spoke with pt, confirmed 2/16 appt

## 2019-05-04 ENCOUNTER — Other Ambulatory Visit: Payer: Self-pay

## 2019-05-04 ENCOUNTER — Inpatient Hospital Stay: Payer: Medicare Other | Admitting: Hematology and Oncology

## 2019-05-04 ENCOUNTER — Inpatient Hospital Stay: Payer: Medicare Other

## 2019-05-04 DIAGNOSIS — N184 Chronic kidney disease, stage 4 (severe): Secondary | ICD-10-CM | POA: Diagnosis not present

## 2019-05-05 ENCOUNTER — Ambulatory Visit: Payer: Medicare Other | Admitting: Endocrinology

## 2019-05-05 ENCOUNTER — Encounter: Payer: Self-pay | Admitting: Endocrinology

## 2019-05-05 VITALS — BP 140/52 | HR 65 | Ht 61.0 in | Wt 134.2 lb

## 2019-05-05 DIAGNOSIS — E039 Hypothyroidism, unspecified: Secondary | ICD-10-CM

## 2019-05-05 DIAGNOSIS — E1165 Type 2 diabetes mellitus with hyperglycemia: Secondary | ICD-10-CM

## 2019-05-05 DIAGNOSIS — R600 Localized edema: Secondary | ICD-10-CM

## 2019-05-05 NOTE — Progress Notes (Signed)
Patient ID: Abigail Hoover, female   DOB: Nov 14, 1945, 74 y.o.   MRN: [id number]    Reason for Appointment:  follow-up of various issues   History of Present Illness   Diagnosis: Type 2 DIABETES MELITUS, long-standing  She has had mild diabetes for several years but has required multiple drugs for control Usually has had postprandial hyperglycemia, this has been better controlled with adding Glyset to her main meals Has never been on insulin or a GLP-1 drug Actos had been stopped previously because of tendency to edema  Recent history: NovoLog 10 units before breakfast and dinner, 5 to 6 units for afternoon snack   Oral hypoglycemic drugs: Januvia 25 mg daily  INSULIN regimen: NovoLog 10 units before breakfast and dinner, 5 to 6 units before snack  Her A1c is 7.7 compared to 6.6  Fructosamine previously was at 280  Current management, problems identified and blood sugar patterns:  She has been taking NovoLog since her visit in 11/20  Since she was having higher fasting readings in 12/20 with NovoLog alone she was told to start back on 25 mg of Januvia  With this fasting readings are better  She was also told to reduce her NOVOLOG at dinnertime to avoid hypoglycemia  However she is still taking up to 11 units at dinnertime even though her carbohydrate intake in the evening is not large  With this her blood sugars have been averaging below 100 postprandially and as low as 63  She does however have the highest readings BEFORE dinner likely to be from her late afternoon snack with tea  Also not clear if blood sugars are higher after lunch  She had gained weight with starting insulin but this is leveled off now  Not doing any walking for exercise  She does not always get much protein with most of her meals and is eating vegetarian food  Side effects from medications: None Proper timing of medications in relation to meals: Yes.          Monitors blood glucose: Up  to 4 times a day    Glucometer: One Touch        Blood Glucose readings from download    PRE-MEAL Fasting Lunch Dinner Bedtime Overall  Glucose range:  117-146   135-244    Mean/median:  131  156  186   139+/-50   POST-MEAL PC Breakfast PC Lunch PC Dinner  Glucose range:    63-202  Mean/median:    97   Previous readings:   PRE-MEAL Fasting Lunch Dinner Bedtime Overall  Glucose range:  126-280  109-226  60-220   60-280  Mean/median:  168  163  148  135   POST-MEAL PC Breakfast PC Lunch PC Dinner  Glucose range:  122, 154   63-122  Mean/median:    80         Meals: 2- 3 meals per day.   Generally eating small breakfast and lunch but with only little protein, is vegetarian   EXERCISE:  None, only activity is housework            Dietician visit: Most recent: None   Wt Readings from Last 3 Encounters:  05/05/19 134 lb 3.2 oz (60.9 kg)  03/08/19 136 lb 12.8 oz (62.1 kg)  02/01/19 127 lb (57.6 kg)   Lab Results  Component Value Date   HGBA1C 7.7 (H) 04/29/2019   HGBA1C 6.6 (H) 01/27/2019   HGBA1C 6.9 (H) 10/30/2018   Lab  Results  Component Value Date   MICROALBUR 1.0 10/30/2018   LDLCALC 68 10/30/2018   CREATININE 1.66 (H) 04/29/2019      Lab Results  Component Value Date   FRUCTOSAMINE 280 12/16/2018   FRUCTOSAMINE 293 (H) 06/15/2018   FRUCTOSAMINE 269 01/08/2018   FRUCTOSAMINE 266 07/26/2016     OTHER problems Addressed today are in review of systems:    Allergies as of 05/05/2019      Reactions   Atorvastatin Rash   Ezetimibe Itching      Medication List       Accurate as of May 05, 2019  4:25 PM. If you have any questions, ask your nurse or doctor.        aspirin EC 81 MG tablet Take 81 mg by mouth daily. Take 1 tablet by mouth once daily.   Carafate 1 GM/10ML suspension Generic drug: sucralfate TAKE 10ML BY MOUTH 4 (FOUR) TIMES A DAY BETWEEN MEALS AND AT BEDTIME   Dexilant 60 MG capsule Generic drug: dexlansoprazole Take 60 mg  by mouth daily. Take 1 tablet by mouth once daily.   Elmiron 100 MG capsule Generic drug: pentosan polysulfate TAKE 1 CAPSULE (100 MG TOTAL) BY MOUTH 3 TIMES DAILY BEFORE MEALS.   furosemide 40 MG tablet Commonly known as: LASIX Take 40 mg by mouth. Take 1 tablet by mouth three times per week.   gabapentin 300 MG capsule Commonly known as: NEURONTIN Take by mouth.   hydrocortisone 2.5 % ointment APPLY TO AFFECED ARES(S) TOPICALLY TWICE DAILY AS NEEDED   ibandronate 150 MG tablet Commonly known as: BONIVA Take 150 mg by mouth every 30 (thirty) days. Take 1 tablet by mouth once per month.   Insulin Pen Needle 31G X 5 MM Misc Use with Novolog   Insulin Syringe-Needle U-100 25G X 5/8" 1 ML Misc USE DAILY TO INJECT INSULIN   isosorbide mononitrate 120 MG 24 hr tablet Commonly known as: IMDUR Take 120 mg by mouth daily. Take 1 tablet by mouth once daily.   Januvia 25 MG tablet Generic drug: sitaGLIPtin TAKE 1 TABLET (25 MG TOTAL) BY MOUTH DAILY.   metoprolol succinate 50 MG 24 hr tablet Commonly known as: TOPROL-XL Take 1 tablet (50 mg total) by mouth daily. Take with or immediately following a meal.   Myrbetriq 50 MG Tb24 tablet Generic drug: mirabegron ER Take by mouth.   nitroGLYCERIN 0.4 MG SL tablet Commonly known as: NITROSTAT Place 1 tablet (0.4 mg total) under the tongue every 5 (five) minutes x 3 doses as needed for chest pain.   NovoLOG FlexPen 100 UNIT/ML FlexPen Generic drug: insulin aspart Inject into the skin 3 (three) times daily with meals. Inject 10-11 units under the skin at breakfast, 2 units around 3-4, and 10-11 units at dinner. What changed: Another medication with the same name was removed. Continue taking this medication, and follow the directions you see here. Changed by: Elayne Snare, MD   OneTouch Delica Plus [id number] Misc TEST BLOOD SUGAR ONCE DAILY   OneTouch Verio test strip Generic drug: glucose blood USE AS INSTRUCTED TO CHECK BLOOD  SUGAR ONCE A DAY ALTERNATING BETWEEN FASTING GLUCOSE CLECKS AND AFTER MEAL CHECKS.   pravastatin 20 MG tablet Commonly known as: PRAVACHOL TAKE ONE TABLET BY MOUTH ONCE DAILY   Synthroid 75 MCG tablet Generic drug: levothyroxine TAKE ONE TABLET BY MOUTH ONCE DAILY   triamcinolone ointment 0.1 % Commonly known as: KENALOG APPLY THIN LAYER ON THE SKIN TWICE A DAY FOR 3  WEEKS       Allergies:  Allergies  Allergen Reactions  . Atorvastatin Rash  . Ezetimibe Itching    Past Medical History:  Diagnosis Date  . 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 Anxiety   . Arthritis   . Bladder irritation   . Diabetes mellitus   . GERD (gastroesophageal reflux disease)   . Hypercholesterolemia   . Hypertension   . Hypothyroidism   . Iron deficiency anemia, unspecified   . Psoriasis   . Transfusion history 03/05/2016   for postoperative (ORIF) anemia superimposed on chronic anemia    Past Surgical History:  Procedure Laterality Date  . ABDOMINAL HYSTERECTOMY     TAH  . BREAST BIOPSY     Left breast biopsy benign lesion  . BREAST BIOPSY  03/11/2012   Procedure: BREAST BIOPSY;  Surgeon: Odis Hollingshead, MD;  Location: [facility];  Service: General;  Laterality: Left;  remove left breast mass  . BREAST EXCISIONAL BIOPSY Left   . CARDIAC CATHETERIZATION N/A 01/02/2015   Procedure: Left Heart Cath and Coronary Angiography;  Surgeon: Charolette Forward, MD;  Location: [facility];  Service: Cardiovascular;  Laterality: N/A;  . CATARACT EXTRACTION    . CERVICAL DISC SURGERY    . COLONOSCOPY  2013   neg. next 2023.   Marland Kitchen EYE SURGERY    . LEFT HIP SURGERY    . TOTAL HIP ARTHROPLASTY Left 03/02/2016   Procedure: TOTAL HIP ARTHROPLASTY ANTERIOR APPROACH;  Surgeon: Rod Can, MD;  Location: [facility];  Service: Orthopedics;  Laterality: Left;    Family History  Problem Relation Age of Onset  . Heart disease Mother        heart attack  . Stroke Father        brain hemorrhage  .  Diabetes Brother   . Cancer Brother   . Breast cancer Sister   . Cancer Sister        breast cancer    Social History:  reports that she has never smoked. She has never used smokeless tobacco. She reports that she does not drink alcohol or use drugs.  Review of Systems:     HYPERTENSION: Mild, this is well-controlled with metoprolol 50 mg only Followed by cardiologist  RENAL dysfunction has been variable and followed by nephrologist every 3 months No hyperkalemia   She takes Lasix 40 mg, half tablet alternating with 1 tablet for  edema from nephrologist Because of edema she was told to start taking 40 to 60 mg daily but she is still not increasing the dose  Lab Results  Component Value Date   CREATININE 1.66 (H) 04/29/2019   CREATININE 1.64 (H) 01/27/2019   CREATININE 2.22 (H) 12/16/2018     HYPERLIPIDEMIA: The lipid abnormality consists of elevated LDL which is mild and well controlled with pravastatin 20 mg LDL consistently below 100 History of possible rash from Lipitor  Lab Results  Component Value Date   CHOL 144 10/30/2018   HDL 44.10 10/30/2018   LDLCALC 68 10/30/2018   TRIG 162.0 (H) 10/30/2018   CHOLHDL 3 10/30/2018    She has a long history of primary HYPOTHYROIDISM   She takes SYNTHROID brand name 75 mcg daily before breakfast with extra tablet once a week  No complaints of fatigue Not taking any iron with her Synthroid in the mornings Her doses has been consistent recently  TSH last normal    Lab Results  Component Value Date   TSH  2.56 04/29/2019   TSH 1.97 01/27/2019   TSH 1.58 12/16/2018   FREET4 1.28 04/29/2019   FREET4 1.56 12/16/2018   FREET4 0.98 10/30/2018     Has been taking gabapentin because of bladder problems as recommended by a urologist a few years ago    Diabetic foot exam in 12/19: Normal monofilament sensation   OSTEOPENIA: Her T score Previously done by gynecologist was -2.1, currently on Boniva started in 01/2014    Last bone density was -2.6 in 2/18, this was ordered by her gastroenterologist    Examination:   BP (!) 140/52 (BP Location: Left Arm, Patient Position: Sitting, Cuff Size: Normal)   Pulse 65   Ht 5\' 1"  (1.549 m)   Wt 134 lb 3.2 oz (60.9 kg)   SpO2 98%   BMI 25.36 kg/m   Body mass index is 25.36 kg/m.    2+ lower leg edema present  ASSESSMENT/ PLAN:    Diabetes type 2, Nonobese   See history of present illness for detailed discussion of current diabetes management, blood sugar patterns and problems identified  Her A1c is last 7.7  Not clear why her A1c is higher even though her blood sugars are recently on an average only 139 including monitoring at different times This may be partly related to her anemia being improved from treatments  She is not clear about her blood sugar targets and is taking excessive amount of insulin at dinnertime causing blood sugar to be low normal or low after dinner Explained to her that she should not take more insulin before dinner if the blood sugars are higher and taking extra NovoLog at dinnertime will not help morning readings  Fasting readings are better with adding low-dose Januvia   Recommendations:  She will need to cut back on her suppertime insulin coverage by 3 to 4 units and take only 6 to 7 units  She will adjust this further to keep her readings after dinner between about 130-160  Also take at least 3 to 4 units for her snack at the time instead of 2 units  No change in Januvia or lunchtime dose  Encourage her to add some protein with every meal  Also check fructosamine with next visit  RENAL dysfunction: This is stable  EDEMA: This is still significant She will take at least 40 mg daily and follow-up with nephrologist soon  Hypothyroidism: Adequately controlled with normal TSH now    Patient Instructions  For tea time take 3-4 units  Dinner time take 6-7 units only       Elayne Snare 05/05/2019, 4:25 PM

## 2019-05-05 NOTE — Patient Instructions (Addendum)
For tea time take 3-4 units  Dinner time take 6-7 units only  Lasix 40mg  daily

## 2019-05-10 DIAGNOSIS — D631 Anemia in chronic kidney disease: Secondary | ICD-10-CM | POA: Diagnosis not present

## 2019-05-10 DIAGNOSIS — N2581 Secondary hyperparathyroidism of renal origin: Secondary | ICD-10-CM | POA: Diagnosis not present

## 2019-05-10 DIAGNOSIS — N184 Chronic kidney disease, stage 4 (severe): Secondary | ICD-10-CM | POA: Diagnosis not present

## 2019-05-10 DIAGNOSIS — I129 Hypertensive chronic kidney disease with stage 1 through stage 4 chronic kidney disease, or unspecified chronic kidney disease: Secondary | ICD-10-CM | POA: Diagnosis not present

## 2019-05-11 ENCOUNTER — Other Ambulatory Visit: Payer: Self-pay

## 2019-05-11 ENCOUNTER — Other Ambulatory Visit: Payer: Self-pay | Admitting: Hematology and Oncology

## 2019-05-11 ENCOUNTER — Encounter: Payer: Self-pay | Admitting: Hematology and Oncology

## 2019-05-11 ENCOUNTER — Inpatient Hospital Stay: Payer: Medicare Other

## 2019-05-11 ENCOUNTER — Inpatient Hospital Stay (HOSPITAL_BASED_OUTPATIENT_CLINIC_OR_DEPARTMENT_OTHER): Payer: Medicare Other | Admitting: Hematology and Oncology

## 2019-05-11 ENCOUNTER — Telehealth: Payer: Self-pay | Admitting: Hematology and Oncology

## 2019-05-11 ENCOUNTER — Inpatient Hospital Stay: Payer: Medicare Other | Attending: Hematology and Oncology

## 2019-05-11 DIAGNOSIS — Z794 Long term (current) use of insulin: Secondary | ICD-10-CM | POA: Insufficient documentation

## 2019-05-11 DIAGNOSIS — Z803 Family history of malignant neoplasm of breast: Secondary | ICD-10-CM | POA: Insufficient documentation

## 2019-05-11 DIAGNOSIS — E538 Deficiency of other specified B group vitamins: Secondary | ICD-10-CM

## 2019-05-11 DIAGNOSIS — Z79899 Other long term (current) drug therapy: Secondary | ICD-10-CM | POA: Diagnosis not present

## 2019-05-11 DIAGNOSIS — E1122 Type 2 diabetes mellitus with diabetic chronic kidney disease: Secondary | ICD-10-CM | POA: Insufficient documentation

## 2019-05-11 DIAGNOSIS — D631 Anemia in chronic kidney disease: Secondary | ICD-10-CM

## 2019-05-11 DIAGNOSIS — N183 Chronic kidney disease, stage 3 unspecified: Secondary | ICD-10-CM | POA: Diagnosis not present

## 2019-05-11 LAB — CBC WITH DIFFERENTIAL/PLATELET
Abs Immature Granulocytes: 0.01 10*3/uL (ref 0.00–0.07)
Basophils Absolute: 0 10*3/uL (ref 0.0–0.1)
Basophils Relative: 0 %
Eosinophils Absolute: 0.6 10*3/uL — ABNORMAL HIGH (ref 0.0–0.5)
Eosinophils Relative: 10 %
HCT: 32 % — ABNORMAL LOW (ref 36.0–46.0)
Hemoglobin: 9.8 g/dL — ABNORMAL LOW (ref 12.0–15.0)
Immature Granulocytes: 0 %
Lymphocytes Relative: 27 %
Lymphs Abs: 1.8 10*3/uL (ref 0.7–4.0)
MCH: 26.3 pg (ref 26.0–34.0)
MCHC: 30.6 g/dL (ref 30.0–36.0)
MCV: 85.8 fL (ref 80.0–100.0)
Monocytes Absolute: 0.4 10*3/uL (ref 0.1–1.0)
Monocytes Relative: 5 %
Neutro Abs: 3.9 10*3/uL (ref 1.7–7.7)
Neutrophils Relative %: 58 %
Platelets: 226 10*3/uL (ref 150–400)
RBC: 3.73 MIL/uL — ABNORMAL LOW (ref 3.87–5.11)
RDW: 14.1 % (ref 11.5–15.5)
WBC: 6.7 10*3/uL (ref 4.0–10.5)
nRBC: 0 % (ref 0.0–0.2)

## 2019-05-11 MED ORDER — CYANOCOBALAMIN 1000 MCG/ML IJ SOLN
1000.0000 ug | Freq: Once | INTRAMUSCULAR | Status: AC
  Administered 2019-05-11: 1000 ug via INTRAMUSCULAR

## 2019-05-11 MED ORDER — CYANOCOBALAMIN 1000 MCG/ML IJ SOLN
INTRAMUSCULAR | Status: AC
  Filled 2019-05-11: qty 1

## 2019-05-11 MED ORDER — DARBEPOETIN ALFA 200 MCG/0.4ML IJ SOSY
200.0000 ug | PREFILLED_SYRINGE | Freq: Once | INTRAMUSCULAR | Status: AC
  Administered 2019-05-11: 200 ug via SUBCUTANEOUS

## 2019-05-11 MED ORDER — DARBEPOETIN ALFA 200 MCG/0.4ML IJ SOSY
PREFILLED_SYRINGE | INTRAMUSCULAR | Status: AC
  Filled 2019-05-11: qty 0.4

## 2019-05-11 NOTE — Telephone Encounter (Signed)
Per 2/16 sch msg. Pt is aware of appt date and time

## 2019-05-11 NOTE — Patient Instructions (Signed)
Cyanocobalamin, Vitamin B12 injection What is this medicine? CYANOCOBALAMIN (sye an oh koe BAL a min) is a man made form of vitamin B12. Vitamin B12 is used in the growth of healthy blood cells, nerve cells, and proteins in the body. It also helps with the metabolism of fats and carbohydrates. This medicine is used to treat people who can not absorb vitamin B12. This medicine may be used for other purposes; ask your health care provider or pharmacist if you have questions. COMMON BRAND NAME(S): B-12 Compliance Kit, B-12 Injection Kit, Cyomin, LA-12, Nutri-Twelve, Physicians EZ Use B-12, Primabalt What should I tell my health care provider before I take this medicine? They need to know if you have any of these conditions:  kidney disease  Leber's disease  megaloblastic anemia  an unusual or allergic reaction to cyanocobalamin, cobalt, other medicines, foods, dyes, or preservatives  pregnant or trying to get pregnant  breast-feeding How should I use this medicine? This medicine is injected into a muscle or deeply under the skin. It is usually given by a health care professional in a clinic or doctor's office. However, your doctor may teach you how to inject yourself. Follow all instructions. Talk to your pediatrician regarding the use of this medicine in children. Special care may be needed. Overdosage: If you think you have taken too much of this medicine contact a poison control center or emergency room at once. NOTE: This medicine is only for you. Do not share this medicine with others. What if I miss a dose? If you are given your dose at a clinic or doctor's office, call to reschedule your appointment. If you give your own injections and you miss a dose, take it as soon as you can. If it is almost time for your next dose, take only that dose. Do not take double or extra doses. What may interact with this medicine?  colchicine  heavy alcohol intake This list may not describe all  possible interactions. Give your health care provider a list of all the medicines, herbs, non-prescription drugs, or dietary supplements you use. Also tell them if you smoke, drink alcohol, or use illegal drugs. Some items may interact with your medicine. What should I watch for while using this medicine? Visit your doctor or health care professional regularly. You may need blood work done while you are taking this medicine. You may need to follow a special diet. Talk to your doctor. Limit your alcohol intake and avoid smoking to get the best benefit. What side effects may I notice from receiving this medicine? Side effects that you should report to your doctor or health care professional as soon as possible:  allergic reactions like skin rash, itching or hives, swelling of the face, lips, or tongue  blue tint to skin  chest tightness, pain  difficulty breathing, wheezing  dizziness  red, swollen painful area on the leg Side effects that usually do not require medical attention (report to your doctor or health care professional if they continue or are bothersome):  diarrhea  headache This list may not describe all possible side effects. Call your doctor for medical advice about side effects. You may report side effects to FDA at 1-800-FDA-1088. Where should I keep my medicine? Keep out of the reach of children. Store at room temperature between 15 and 30 degrees C (59 and 85 degrees F). Protect from light. Throw away any unused medicine after the expiration date. NOTE: This sheet is a summary. It may not cover  all possible information. If you have questions about this medicine, talk to your doctor, pharmacist, or health care provider.  2020 Elsevier/Gold Standard (2007-06-22 22:10:20) Darbepoetin Alfa injection What is this medicine? DARBEPOETIN ALFA (dar be POE e tin AL fa) helps your body make more red blood cells. It is used to treat anemia caused by chronic kidney failure and  chemotherapy. This medicine may be used for other purposes; ask your health care provider or pharmacist if you have questions. COMMON BRAND NAME(S): Aranesp What should I tell my health care provider before I take this medicine? They need to know if you have any of these conditions:  blood clotting disorders or history of blood clots  cancer patient not on chemotherapy  cystic fibrosis  heart disease, such as angina, heart failure, or a history of a heart attack  hemoglobin level of 12 g/dL or greater  high blood pressure  low levels of folate, iron, or vitamin B12  seizures  an unusual or allergic reaction to darbepoetin, erythropoietin, albumin, hamster proteins, latex, other medicines, foods, dyes, or preservatives  pregnant or trying to get pregnant  breast-feeding How should I use this medicine? This medicine is for injection into a vein or under the skin. It is usually given by a health care professional in a hospital or clinic setting. If you get this medicine at home, you will be taught how to prepare and give this medicine. Use exactly as directed. Take your medicine at regular intervals. Do not take your medicine more often than directed. It is important that you put your used needles and syringes in a special sharps container. Do not put them in a trash can. If you do not have a sharps container, call your pharmacist or healthcare provider to get one. A special MedGuide will be given to you by the pharmacist with each prescription and refill. Be sure to read this information carefully each time. Talk to your pediatrician regarding the use of this medicine in children. While this medicine may be used in children as young as 1 month of age for selected conditions, precautions do apply. Overdosage: If you think you have taken too much of this medicine contact a poison control center or emergency room at once. NOTE: This medicine is only for you. Do not share this medicine  with others. What if I miss a dose? If you miss a dose, take it as soon as you can. If it is almost time for your next dose, take only that dose. Do not take double or extra doses. What may interact with this medicine? Do not take this medicine with any of the following medications:  epoetin alfa This list may not describe all possible interactions. Give your health care provider a list of all the medicines, herbs, non-prescription drugs, or dietary supplements you use. Also tell them if you smoke, drink alcohol, or use illegal drugs. Some items may interact with your medicine. What should I watch for while using this medicine? Your condition will be monitored carefully while you are receiving this medicine. You may need blood work done while you are taking this medicine. This medicine may cause a decrease in vitamin B6. You should make sure that you get enough vitamin B6 while you are taking this medicine. Discuss the foods you eat and the vitamins you take with your health care professional. What side effects may I notice from receiving this medicine? Side effects that you should report to your doctor or health care professional   as soon as possible:  allergic reactions like skin rash, itching or hives, swelling of the face, lips, or tongue  breathing problems  changes in vision  chest pain  confusion, trouble speaking or understanding  feeling faint or lightheaded, falls  high blood pressure  muscle aches or pains  pain, swelling, warmth in the leg  rapid weight gain  severe headaches  sudden numbness or weakness of the face, arm or leg  trouble walking, dizziness, loss of balance or coordination  seizures (convulsions)  swelling of the ankles, feet, hands  unusually weak or tired Side effects that usually do not require medical attention (report to your doctor or health care professional if they continue or are bothersome):  diarrhea  fever, chills (flu-like  symptoms)  headaches  nausea, vomiting  redness, stinging, or swelling at site where injected This list may not describe all possible side effects. Call your doctor for medical advice about side effects. You may report side effects to FDA at 1-800-FDA-1088. Where should I keep my medicine? Keep out of the reach of children. Store in a refrigerator between 2 and 8 degrees C (36 and 46 degrees F). Do not freeze. Do not shake. Throw away any unused portion if using a single-dose vial. Throw away any unused medicine after the expiration date. NOTE: This sheet is a summary. It may not cover all possible information. If you have questions about this medicine, talk to your doctor, pharmacist, or health care provider.  2020 Elsevier/Gold Standard (2017-03-26 16:44:20)

## 2019-05-11 NOTE — Assessment & Plan Note (Signed)
She has chronic kidney disease secondary to diabetes We will continue to observe closely

## 2019-05-11 NOTE — Progress Notes (Signed)
[facility] OFFICE PROGRESS NOTE  System, Pcp Not In  ASSESSMENT & PLAN:  Anemia in chronic kidney disease We discussed some of the risks, benefits, and alternatives of erythropoietin stimulating agents such as Procrit or Aranesp.  She tolerated injections well  I will space out her darbepoetin injection to every 3-4 months The goal is to keep hemoglobin at greater than 11  Vitamin B 12 deficiency She has received B12 injections Her last B12 level was satisfactory She will continue to receive B12 injection every 3-4 months  Chronic kidney disease, stage III (moderate) She has chronic kidney disease secondary to diabetes We will continue to observe closely  Diabetes mellitus with chronic kidney disease, with long-term current use of insulin ([place]) She was recently started on insulin therapy According to the patient, her blood sugar is better controlled   No orders of the defined types were placed in this encounter.   The total time spent in the appointment was 20 minutes encounter with patients including review of chart and various tests results, discussions about plan of care and coordination of care plan   All questions were answered. The patient knows to call the clinic with any problems, questions or concerns. No barriers to learning was detected.    [name], MD 2/16/20212:21 PM  INTERVAL HISTORY: Abigail Hoover 74 y.o. female returns for further follow-up on vitamin B12 deficiency along with anemia chronic kidney disease She feels well She denies excessive fatigue The patient denies any recent signs or symptoms of bleeding such as spontaneous epistaxis, hematuria or hematochezia. She has no further concerns about her abnormal breast changes  SUMMARY OF HEMATOLOGIC HISTORY:  This is a patient that has been followed here since 2011 for chronic anemia In 2011, she had a bone marrow aspirate and biopsy that came back normal. The patient has been  receiving Aranesp injection for chronic anemia up until 2014. It was subsequently discontinued due stability of the anemia. Starting 05/27/2014, darbepoetin was resumed at 200 g every other month to keep hemoglobin greater than 10 g. Treatment was discontinued in October 2016 and resumed in September 2017 The patient is subsequently found to have severe vitamin B12 deficiency and is started on vitamin B12 injection on December 25, 2016 along with resumption of darbepoetin injection every 5 to 6 weeks.  She is also seen due to family history of breast cancer  I have reviewed the past medical history, past surgical history, social history and family history with the patient and they are unchanged from previous note.  ALLERGIES:  is allergic to atorvastatin and ezetimibe.  MEDICATIONS:  Current Outpatient Medications  Medication Sig Dispense Refill  . aspirin EC 81 MG tablet Take 81 mg by mouth daily. Take 1 tablet by mouth once daily.    Marland Kitchen CARAFATE 1 GM/10ML suspension TAKE 10ML BY MOUTH 4 (FOUR) TIMES A DAY BETWEEN MEALS AND AT BEDTIME    . DEXILANT 60 MG capsule Take 60 mg by mouth daily. Take 1 tablet by mouth once daily.    . furosemide (LASIX) 40 MG tablet Take 40 mg by mouth. Take 1 tablet by mouth three times per week.    . gabapentin (NEURONTIN) 300 MG capsule Take by mouth.    Marland Kitchen glucose blood (ONETOUCH VERIO) test strip USE AS INSTRUCTED TO CHECK BLOOD SUGAR ONCE A DAY ALTERNATING BETWEEN FASTING GLUCOSE CLECKS AND AFTER MEAL CHECKS. 100 each 8  . hydrocortisone 2.5 % ointment APPLY TO AFFECED ARES(S) TOPICALLY TWICE DAILY  AS NEEDED  3  . ibandronate (BONIVA) 150 MG tablet Take 150 mg by mouth every 30 (thirty) days. Take 1 tablet by mouth once per month.    . insulin aspart (NOVOLOG FLEXPEN) 100 UNIT/ML FlexPen Inject into the skin 3 (three) times daily with meals. Inject 10-11 units under the skin at breakfast, 2 units around 3-4, and 10-11 units at dinner.    . Insulin Pen Needle 31G X 5  MM MISC Use with Novolog 100 each 1  . Insulin Syringe-Needle U-100 25G X 5/8" 1 ML MISC USE DAILY TO INJECT INSULIN 100 each 3  . isosorbide mononitrate (IMDUR) 120 MG 24 hr tablet Take 120 mg by mouth daily. Take 1 tablet by mouth once daily.    Marland Kitchen JANUVIA 25 MG tablet TAKE 1 TABLET (25 MG TOTAL) BY MOUTH DAILY. 30 tablet 2  . Lancets (ONETOUCH DELICA PLUS [id number]) MISC TEST BLOOD SUGAR ONCE DAILY 100 each 2  . metoprolol succinate (TOPROL-XL) 50 MG 24 hr tablet Take 1 tablet (50 mg total) by mouth daily. Take with or immediately following a meal. 30 tablet 3  . mirabegron ER (MYRBETRIQ) 50 MG TB24 tablet Take by mouth.    . nitroGLYCERIN (NITROSTAT) 0.4 MG SL tablet Place 1 tablet (0.4 mg total) under the tongue every 5 (five) minutes x 3 doses as needed for chest pain. 25 tablet 12  . pentosan polysulfate (ELMIRON) 100 MG capsule TAKE 1 CAPSULE (100 MG TOTAL) BY MOUTH 3 TIMES DAILY BEFORE MEALS.    Marland Kitchen pravastatin (PRAVACHOL) 20 MG tablet TAKE ONE TABLET BY MOUTH ONCE DAILY 90 tablet 0  . SYNTHROID 75 MCG tablet TAKE ONE TABLET BY MOUTH ONCE DAILY 90 tablet 0  . triamcinolone ointment (KENALOG) 0.1 % APPLY THIN LAYER ON THE SKIN TWICE A DAY FOR 3 WEEKS  0   No current facility-administered medications for this visit.     REVIEW OF SYSTEMS:   Constitutional: Denies fevers, chills or night sweats Eyes: Denies blurriness of vision Ears, nose, mouth, throat, and face: Denies mucositis or sore throat Respiratory: Denies cough, dyspnea or wheezes Cardiovascular: Denies palpitation, chest discomfort or lower extremity swelling Gastrointestinal:  Denies nausea, heartburn or change in bowel habits Skin: Denies abnormal skin rashes Lymphatics: Denies new lymphadenopathy or easy bruising Neurological:Denies numbness, tingling or new weaknesses Behavioral/Psych: Mood is stable, no new changes  All other systems were reviewed with the patient and are negative.  PHYSICAL EXAMINATION: ECOG  PERFORMANCE STATUS: 1 - Symptomatic but completely ambulatory  Vitals:   05/11/19 1210  BP: (!) 119/38  Pulse: 62  Resp: 18  Temp: 98.3 F (36.8 C)  SpO2: 100%   Filed Weights   05/11/19 1210  Weight: 132 lb 1.6 oz (59.9 kg)    GENERAL:alert, no distress and comfortable SKIN: skin color, texture, turgor are normal, no rashes or significant lesions EYES: normal, Conjunctiva are pink and non-injected, sclera clear OROPHARYNX:no exudate, no erythema and lips, buccal mucosa, and tongue normal  NECK: supple, thyroid normal size, non-tender, without nodularity LYMPH:  no palpable lymphadenopathy in the cervical, axillary or inguinal LUNGS: clear to auscultation and percussion with normal breathing effort HEART: regular rate & rhythm and no murmurs and no lower extremity edema ABDOMEN:abdomen soft, non-tender and normal bowel sounds Musculoskeletal:no cyanosis of digits and no clubbing  NEURO: alert & oriented x 3 with fluent speech, no focal motor/sensory deficits Breast examination is benign.  Previously noted dry crust on her nipples had resolved  LABORATORY DATA:  I have reviewed the data as listed     Component Value Date/Time   NA 138 04/29/2019 1451   NA 138 12/12/2016 1150   K 4.1 04/29/2019 1451   K 4.6 12/12/2016 1150   CL 101 04/29/2019 1451   CL 103 07/27/2012 1259   CO2 30 04/29/2019 1451   CO2 31 (H) 12/12/2016 1150   GLUCOSE 118 (H) 04/29/2019 1451   GLUCOSE 149 (H) 12/12/2016 1150   GLUCOSE 118 (H) 07/27/2012 1259   BUN 29 (H) 04/29/2019 1451   BUN 23.4 12/12/2016 1150   CREATININE 1.66 (H) 04/29/2019 1451   CREATININE 1.7 (H) 12/12/2016 1150   CALCIUM 9.5 04/29/2019 1451   CALCIUM 11.4 (H) 12/12/2016 1150   PROT 6.4 04/29/2019 1451   PROT 6.5 12/12/2016 1150   ALBUMIN 4.0 04/29/2019 1451   ALBUMIN 3.9 12/12/2016 1150   AST 17 04/29/2019 1451   AST 19 12/12/2016 1150   ALT 10 04/29/2019 1451   ALT 10 12/12/2016 1150   ALKPHOS 68 04/29/2019 1451    ALKPHOS 65 12/12/2016 1150   BILITOT 0.3 04/29/2019 1451   BILITOT 0.36 12/12/2016 1150   GFRNONAA 52 (L) 03/04/2016 0528   GFRAA >60 03/04/2016 0528    No results found for: SPEP, UPEP  Lab Results  Component Value Date   WBC 6.7 05/11/2019   NEUTROABS 3.9 05/11/2019   HGB 9.8 (L) 05/11/2019   HCT 32.0 (L) 05/11/2019   MCV 85.8 05/11/2019   PLT 226 05/11/2019      Chemistry      Component Value Date/Time   NA 138 04/29/2019 1451   NA 138 12/12/2016 1150   K 4.1 04/29/2019 1451   K 4.6 12/12/2016 1150   CL 101 04/29/2019 1451   CL 103 07/27/2012 1259   CO2 30 04/29/2019 1451   CO2 31 (H) 12/12/2016 1150   BUN 29 (H) 04/29/2019 1451   BUN 23.4 12/12/2016 1150   CREATININE 1.66 (H) 04/29/2019 1451   CREATININE 1.7 (H) 12/12/2016 1150      Component Value Date/Time   CALCIUM 9.5 04/29/2019 1451   CALCIUM 11.4 (H) 12/12/2016 1150   ALKPHOS 68 04/29/2019 1451   ALKPHOS 65 12/12/2016 1150   AST 17 04/29/2019 1451   AST 19 12/12/2016 1150   ALT 10 04/29/2019 1451   ALT 10 12/12/2016 1150   BILITOT 0.3 04/29/2019 1451   BILITOT 0.36 12/12/2016 1150

## 2019-05-11 NOTE — Assessment & Plan Note (Signed)
She has received B12 injections Her last B12 level was satisfactory She will continue to receive B12 injection every 3-4 months

## 2019-05-11 NOTE — Assessment & Plan Note (Signed)
We discussed some of the risks, benefits, and alternatives of erythropoietin stimulating agents such as Procrit or Aranesp.  She tolerated injections well  I will space out her darbepoetin injection to every 3-4 months The goal is to keep hemoglobin at greater than 11

## 2019-05-11 NOTE — Assessment & Plan Note (Signed)
She was recently started on insulin therapy According to the patient, her blood sugar is better controlled

## 2019-05-19 ENCOUNTER — Other Ambulatory Visit: Payer: Self-pay | Admitting: Endocrinology

## 2019-05-19 ENCOUNTER — Ambulatory Visit: Payer: Medicare Other | Admitting: Podiatry

## 2019-05-19 ENCOUNTER — Other Ambulatory Visit: Payer: Self-pay

## 2019-05-19 DIAGNOSIS — M79674 Pain in right toe(s): Secondary | ICD-10-CM | POA: Diagnosis not present

## 2019-05-19 DIAGNOSIS — E1122 Type 2 diabetes mellitus with diabetic chronic kidney disease: Secondary | ICD-10-CM | POA: Diagnosis not present

## 2019-05-19 DIAGNOSIS — B351 Tinea unguium: Secondary | ICD-10-CM | POA: Diagnosis not present

## 2019-05-19 DIAGNOSIS — N183 Chronic kidney disease, stage 3 unspecified: Secondary | ICD-10-CM

## 2019-05-19 DIAGNOSIS — Z794 Long term (current) use of insulin: Secondary | ICD-10-CM

## 2019-05-19 DIAGNOSIS — M79675 Pain in left toe(s): Secondary | ICD-10-CM | POA: Diagnosis not present

## 2019-05-19 NOTE — Patient Instructions (Signed)

## 2019-05-20 ENCOUNTER — Encounter: Payer: Self-pay | Admitting: Podiatry

## 2019-05-20 NOTE — Progress Notes (Signed)
Subjective: Abigail Hoover presents today for follow up of preventative diabetic foot care and painful mycotic nails b/l that are difficult to trim. Pain interferes with ambulation. Aggravating factors include wearing enclosed shoe gear. Pain is relieved with periodic professional debridement.   She has h/o neuropathy managed with gabapentin. She voices no new pedal concerns on today's visit.  Allergies  Allergen Reactions  . Atorvastatin Rash  . Ezetimibe Itching     Objective: There were no vitals filed for this visit.  Vascular Examination:  Capillary refill time to digits immediate b/l, palpable DP pulses b/l, palpable PT pulses b/l, pedal hair sparse b/l and skin temperature gradient within normal limits b/l  Dermatological Examination: Pedal skin with normal turgor, texture and tone bilaterally, no open wounds bilaterally, no interdigital macerations bilaterally and toenails 1-5 b/l elongated, dystrophic, thickened, crumbly with subungual debris  Musculoskeletal: Normal muscle strength 5/5 to all lower extremity muscle groups bilaterally, no gross bony deformities bilaterally and no pain crepitus or joint limitation noted with ROM b/l  Neurological: Protective sensation intact 5/5 intact bilaterally with 10g monofilament b/l and vibratory sensation intact b/l  Hemoglobin A1C Latest Ref Rng & Units 04/29/2019 01/27/2019 10/30/2018  HGBA1C 4.6 - 6.5 % 7.7(H) 6.6(H) 6.9(H)  Some recent data might be hidden   Assessment: 1. Pain due to onychomycosis of toenails of both feet   2. Type 2 diabetes mellitus with stage 3 chronic kidney disease, with long-term current use of insulin, unspecified whether stage 3a or 3b CKD ([place])    Plan: -Continue diabetic foot care principles. Literature dispensed on today.  -Toenails 1-5 b/l were debrided in length and girth with sterile nail nippers and dremel without iatrogenic bleeding. -Patient to continue soft, supportive shoe gear daily. -Patient  to report any pedal injuries to medical professional immediately. -Patient/POA to call should there be question/concern in the interim.  Return in about 3 months (around 08/16/2019) for diabetic nail trim.

## 2019-05-26 ENCOUNTER — Other Ambulatory Visit: Payer: Self-pay

## 2019-05-26 ENCOUNTER — Telehealth: Payer: Self-pay | Admitting: Endocrinology

## 2019-05-26 MED ORDER — ONETOUCH VERIO VI STRP: ORAL_STRIP | 8 refills | Status: DC

## 2019-05-26 NOTE — Telephone Encounter (Signed)
She can test 3 times a day, can you check in the morning waking up every other day

## 2019-05-26 NOTE — Telephone Encounter (Signed)
New Rx sent.

## 2019-05-26 NOTE — Telephone Encounter (Signed)
Patient called to request an updated RX for her test strips.  She is testing 3x-4x a day and is running out of test strips before being able to fill them again.  [facility] [address].    Any questions you can call [phone].

## 2019-05-26 NOTE — Telephone Encounter (Signed)
Ok to send

## 2019-05-27 ENCOUNTER — Telehealth: Payer: Self-pay | Admitting: Endocrinology

## 2019-05-27 ENCOUNTER — Other Ambulatory Visit: Payer: Self-pay

## 2019-05-27 MED ORDER — ONETOUCH VERIO VI STRP: ORAL_STRIP | 8 refills | Status: DC

## 2019-05-27 NOTE — Telephone Encounter (Signed)
MEDICATION: glucose blood (ONETOUCH VERIO) test strip  PHARMACY:   [facility] [phone] - [place], [place] AT [place] Phone:  [phone]  Fax:  [phone]      IS THIS A 90 DAY SUPPLY : Yes  IS PATIENT OUT OF MEDICATION: Yes  IF NOT; HOW MUCH IS LEFT: 0  LAST APPOINTMENT DATE: @3 /05/2019  NEXT APPOINTMENT DATE:@5 /12/2019  DO WE HAVE YOUR PERMISSION TO LEAVE A DETAILED MESSAGE: No  OTHER COMMENTS: Please resend RX (previously sent to [facility]) to [facility] listed above.   **Let patient know to contact pharmacy at the end of the day to make sure medication is ready. **  ** Please notify patient to allow 48-72 hours to process**  **Encourage patient to contact the pharmacy for refills or they can request refills through [facility]**

## 2019-05-27 NOTE — Telephone Encounter (Signed)
Rx sent 

## 2019-05-28 ENCOUNTER — Telehealth: Payer: Self-pay | Admitting: Endocrinology

## 2019-05-28 ENCOUNTER — Other Ambulatory Visit: Payer: Self-pay

## 2019-05-28 MED ORDER — INSULIN PEN NEEDLE 31G X 5 MM MISC: 1 refills | Status: AC

## 2019-05-28 NOTE — Telephone Encounter (Signed)
Make it 90 day Rx

## 2019-05-28 NOTE — Telephone Encounter (Signed)
How would you like to handle this? Sig was changed to testing TID, but cannot get patient to understand that the math has to match the prescription. We cannot just give extra for the sake of giving extra.

## 2019-05-28 NOTE — Telephone Encounter (Signed)
[name] called to request a new RX with an increase of quantity to 300 glucose blood (ONETOUCH VERIO) test strip (current quantity 100 test strips) be sent to:  [facility] [phone] - [place], [place] AT [place] Phone:  [phone]  Fax:  [phone]

## 2019-05-31 ENCOUNTER — Other Ambulatory Visit: Payer: Self-pay

## 2019-05-31 MED ORDER — ONETOUCH VERIO VI STRP: ORAL_STRIP | 8 refills | Status: DC

## 2019-06-15 ENCOUNTER — Telehealth: Payer: Self-pay

## 2019-06-15 NOTE — Telephone Encounter (Signed)
PA initiated via [email or web address] for Januvia.  Nayeli Lanum Key: [id number] - Rx #: [id number] help? Call us at [phone] Status Sent to [place] 25MG  tablets Form [employer] [place] Medicare Part D General Authorization Form Original Claim Info [id number] NON-PARTICIPATING PHARMACY;IF HIT OR LTCSUBMISSION, VERIFY NCPDP FIELDS AGAINSTPHARMACY PROCESSING INSTRUCTIONS  [place] (Key: [id number])  Your information has been submitted to [place]. [employer] [place] will review the request and notify you of the determination decision directly, typically within 3 business days of your submission and once all necessary information is received.  You will also receive your request decision electronically. To check for an update later, open the request again from your dashboard.  If [employer] [place] has not responded within the specified timeframe or if you have any questions about your PA submission, contact [place] directly at [facility]) [phone] or ([place]) [phone].

## 2019-06-16 ENCOUNTER — Other Ambulatory Visit: Payer: Self-pay | Admitting: Endocrinology

## 2019-06-18 DIAGNOSIS — L308 Other specified dermatitis: Secondary | ICD-10-CM | POA: Diagnosis not present

## 2019-06-18 DIAGNOSIS — L309 Dermatitis, unspecified: Secondary | ICD-10-CM | POA: Diagnosis not present

## 2019-06-24 ENCOUNTER — Telehealth: Payer: Self-pay | Admitting: Endocrinology

## 2019-06-24 ENCOUNTER — Other Ambulatory Visit: Payer: Self-pay

## 2019-06-24 MED ORDER — PRAVASTATIN SODIUM 20 MG PO TABS: 20.0000 mg | ORAL_TABLET | Freq: Every day | ORAL | 1 refills | Status: DC

## 2019-06-24 NOTE — Telephone Encounter (Signed)
Patient called re: Patient's preferred Pharmacy is now: [facility] [place], [place] Phone:  [phone]  Fax:  [phone]     Also:  MEDICATION: pravastatin (PRAVACHOL) 20 MG tablet  PHARMACY:   [facility] [phone] - [place], [place] AT [place] Phone:  [phone]  Fax:  [phone]      IS THIS A 90 DAY SUPPLY :  Yes  IS PATIENT OUT OF MEDICATION: No  IF NOT; HOW MUCH IS LEFT: 10 tablets  LAST APPOINTMENT DATE: @3 /24/2021  NEXT APPOINTMENT DATE:@5 /12/2019  DO WE HAVE YOUR PERMISSION TO LEAVE A DETAILED MESSAGE: No-No VM  OTHER COMMENTS:    **Let patient know to contact pharmacy at the end of the day to make sure medication is ready. **  ** Please notify patient to allow 48-72 hours to process**  **Encourage patient to contact the pharmacy for refills or they can request refills through [facility]**

## 2019-06-24 NOTE — Telephone Encounter (Signed)
Rx sent 

## 2019-07-13 ENCOUNTER — Ambulatory Visit: Payer: Medicare Other | Admitting: Neurology

## 2019-07-13 ENCOUNTER — Encounter: Payer: Self-pay | Admitting: Neurology

## 2019-07-13 VITALS — BP 130/58 | HR 52 | Temp 97.9°F | Ht 61.0 in | Wt 128.0 lb

## 2019-07-13 DIAGNOSIS — G44209 Tension-type headache, unspecified, not intractable: Secondary | ICD-10-CM | POA: Diagnosis not present

## 2019-07-13 DIAGNOSIS — G3184 Mild cognitive impairment, so stated: Secondary | ICD-10-CM | POA: Diagnosis not present
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
## 2019-07-13 NOTE — Patient Instructions (Signed)
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  I recommend she do regular neck stretching exercises and use Tylenol as needed but not more than 2 or 3 times per day and not more than 3 days/week to avoid analgesic rebound.  I also recommend she increase participation in cognitively challenging activities like solving crossword puzzles, playing bridge, sudoku.  We also discussed memory compensation strategies.  She will return for follow-up in the future only as necessary and no schedule appointment was made.  Neck Exercises Ask your health care provider which exercises are safe for you. Do exercises exactly as told by your health care provider and adjust them as directed. It is normal to feel mild stretching, pulling, tightness, or discomfort as you do these exercises. Stop right away if you feel sudden pain or your pain gets worse. Do not begin these exercises until told by your health care provider. Neck exercises can be important for many reasons. They can improve strength and maintain flexibility in your neck, which will help your upper back and prevent neck pain. Stretching exercises Rotation neck stretching  1. Sit in a chair or stand up. 2. Place your feet flat on the floor, shoulder width apart. 3. Slowly turn your head (rotate) to the right until a slight stretch is felt. Turn it all the way to the right so you can look over your right shoulder. Do not tilt or tip your head. 4. Hold this position for 10-30 seconds. 5. Slowly turn your head (rotate) to the left until a slight stretch is felt. Turn it all the way to the left so you can look over your left shoulder. Do not tilt or tip your head. 6. Hold this position for 10-30 seconds. Repeat __________ times. Complete this exercise __________ times a day. Neck retraction 1. Sit in a sturdy chair or stand up. 2. Look straight ahead. Do not bend your  neck. 3. Use your fingers to push your chin backward (retraction). Do not bend your neck for this movement. Continue to face straight ahead. If you are doing the exercise properly, you will feel a slight sensation in your throat and a stretch at the back of your neck. 4. Hold the stretch for 1-2 seconds. Repeat __________ times. Complete this exercise __________ times a day. Strengthening exercises Neck press 1. Lie on your back on a firm bed or on the floor with a pillow under your head. 2. Use your neck muscles to push your head down on the pillow and straighten your spine. 3. Hold the position as well as you can. Keep your head facing up (in a neutral position) and your chin tucked. 4. Slowly count to 5 while holding this position. Repeat __________ times. Complete this exercise __________ times a day. Isometrics These are exercises in which you strengthen the muscles in your neck while keeping your neck still (isometrics). 1. Sit in a supportive chair and place your hand on your forehead. 2. Keep your head and face facing straight ahead. Do not flex or extend your neck while doing isometrics. 3. Push forward with your head and neck while pushing back with your hand. Hold for 10 seconds. 4. Do the sequence again, this time putting your hand against the back of your head. Use your head and neck to push backward against the hand pressure. 5. Finally, do the same exercise on either side of your head, pushing sideways against  the pressure of your hand. Repeat __________ times. Complete this exercise __________ times a day. Prone head lifts 1. Lie face-down (prone position), resting on your elbows so that your chest and upper back are raised. 2. Start with your head facing downward, near your chest. Position your chin either on or near your chest. 3. Slowly lift your head upward. Lift until you are looking straight ahead. Then continue lifting your head as far back as you can comfortably  stretch. 4. Hold your head up for 5 seconds. Then slowly lower it to your starting position. Repeat __________ times. Complete this exercise __________ times a day. Supine head lifts 1. Lie on your back (supine position), bending your knees to point to the ceiling and keeping your feet flat on the floor. 2. Lift your head slowly off the floor, raising your chin toward your chest. 3. Hold for 5 seconds. Repeat __________ times. Complete this exercise __________ times a day. Scapular retraction 1. Stand with your arms at your sides. Look straight ahead. 2. Slowly pull both shoulders (scapulae) backward and downward (retraction) until you feel a stretch between your shoulder blades in your upper back. 3. Hold for 10-30 seconds. 4. Relax and repeat. Repeat __________ times. Complete this exercise __________ times a day. Contact a health care provider if:  Your neck pain or discomfort gets much worse when you do an exercise.  Your neck pain or discomfort does not improve within 2 hours after you exercise. If you have any of these problems, stop exercising right away. Do not do the exercises again unless your health care provider says that you can. Get help right away if:  You develop sudden, severe neck pain. If this happens, stop exercising right away. Do not do the exercises again unless your health care provider says that you can. This information is not intended to replace advice given to you by your health care provider. Make sure you discuss any questions you have with your health care provider. Document Revised: 01/07/2018 Document Reviewed: 01/07/2018 Elsevier Patient Education  [employer] Compensation Strategies  1. Use "WARM" strategy.  W= write it down  A= associate it  R= repeat it  M= make a mental note  2.   You can keep a Social worker.  Use a 3-ring notebook with sections for the following: calendar, important names and phone numbers,  medications,  doctors' names/phone numbers, lists/reminders, and a section to journal what you did  each day.   3.    Use a calendar to write appointments down.  4.    Write yourself a schedule for the day.  This can be placed on the calendar or in a separate section of the Memory Notebook.  Keeping a  regular schedule can help memory.  5.    Use medication organizer with sections for each day or morning/evening pills.  You may need help loading it  6.    Keep a basket, or pegboard by the door.  Place items that you need to take out with you in the basket or on the pegboard.  You may also want to  include a message board for reminders.  7.    Use sticky notes.  Place sticky notes with reminders in a place where the task is performed.  For example: " turn off the  stove" placed by the stove, "lock the door" placed on the door at eye level, " take your medications" on  the bathroom mirror or by  the place where you normally take your medications.  8.    Use alarms/timers.  Use while cooking to remind yourself to check on food or as a reminder to take your medicine, or as a  reminder to make a call, or as a reminder to perform another task, etc.

## 2019-07-30 ENCOUNTER — Other Ambulatory Visit: Payer: Self-pay

## 2019-07-30 ENCOUNTER — Other Ambulatory Visit (INDEPENDENT_AMBULATORY_CARE_PROVIDER_SITE_OTHER): Payer: Medicare Other

## 2019-07-30 DIAGNOSIS — E039 Hypothyroidism, unspecified: Secondary | ICD-10-CM

## 2019-07-30 DIAGNOSIS — E1165 Type 2 diabetes mellitus with hyperglycemia: Secondary | ICD-10-CM | POA: Diagnosis not present

## 2019-07-30 LAB — BASIC METABOLIC PANEL
BUN: 33 mg/dL — ABNORMAL HIGH (ref 6–23)
CO2: 32 mEq/L (ref 19–32)
Calcium: 9.8 mg/dL (ref 8.4–10.5)
Chloride: 99 mEq/L (ref 96–112)
Creatinine, Ser: 1.74 mg/dL — ABNORMAL HIGH (ref 0.40–1.20)
GFR: 28.43 mL/min — ABNORMAL LOW (ref >60.00)
Glucose, Bld: 175 mg/dL — ABNORMAL HIGH (ref 70–99)
Potassium: 4.3 mEq/L (ref 3.5–5.1)
Sodium: 138 mEq/L (ref 135–145)

## 2019-07-30 LAB — LIPID PANEL
Cholesterol: 155 mg/dL (ref 0–200)
HDL: 57 mg/dL (ref >39.00)
LDL Cholesterol: 76 mg/dL (ref 0–99)
NonHDL: 98.35
Total CHOL/HDL Ratio: 3
Triglycerides: 110 mg/dL (ref 0.0–149.0)
VLDL: 22 mg/dL (ref 0.0–40.0)

## 2019-07-30 LAB — T4, FREE: Free T4: 1.83 ng/dL — ABNORMAL HIGH (ref 0.60–1.60)

## 2019-07-30 LAB — TSH: TSH: 0.2 u[IU]/mL — ABNORMAL LOW (ref 0.35–4.50)

## 2019-07-30 LAB — HEMOGLOBIN A1C: Hgb A1c MFr Bld: 7.6 % — ABNORMAL HIGH (ref 4.6–6.5)

## 2019-07-31 LAB — FRUCTOSAMINE: Fructosamine: 294 umol/L — ABNORMAL HIGH (ref 0–285)

## 2019-08-02 ENCOUNTER — Other Ambulatory Visit: Payer: Medicare Other

## 2019-08-02 ENCOUNTER — Telehealth: Payer: Self-pay | Admitting: Endocrinology

## 2019-08-02 NOTE — Telephone Encounter (Signed)
Patient requests that her recent lab results be faxed to [facility] Dr. Weyman Pedro ph# [phone]. Patient states she does not have Dr.l Merry Proud Patel's Fax#.

## 2019-08-02 NOTE — Telephone Encounter (Signed)
done

## 2019-08-03 DIAGNOSIS — M549 Dorsalgia, unspecified: Secondary | ICD-10-CM | POA: Diagnosis not present

## 2019-08-03 DIAGNOSIS — I10 Essential (primary) hypertension: Secondary | ICD-10-CM | POA: Diagnosis not present

## 2019-08-06 ENCOUNTER — Other Ambulatory Visit: Payer: Self-pay

## 2019-08-06 ENCOUNTER — Ambulatory Visit: Payer: Medicare Other | Admitting: Endocrinology

## 2019-08-06 ENCOUNTER — Ambulatory Visit (INDEPENDENT_AMBULATORY_CARE_PROVIDER_SITE_OTHER): Payer: Medicare Other | Admitting: Endocrinology

## 2019-08-06 ENCOUNTER — Encounter: Payer: Self-pay | Admitting: Endocrinology

## 2019-08-06 VITALS — BP 144/60 | HR 67 | Ht 61.0 in | Wt 126.8 lb

## 2019-08-06 DIAGNOSIS — E7800 Pure hypercholesterolemia, unspecified: Secondary | ICD-10-CM | POA: Diagnosis not present

## 2019-08-06 DIAGNOSIS — Z794 Long term (current) use of insulin: Secondary | ICD-10-CM

## 2019-08-06 DIAGNOSIS — E039 Hypothyroidism, unspecified: Secondary | ICD-10-CM

## 2019-08-06 DIAGNOSIS — E1165 Type 2 diabetes mellitus with hyperglycemia: Secondary | ICD-10-CM

## 2019-08-06 MED ORDER — ONETOUCH DELICA PLUS LANCET33G MISC: 2 refills | Status: DC

## 2019-08-06 MED ORDER — ONETOUCH DELICA LANCING DEV MISC: 1.0000 | Freq: Every day | 0 refills | Status: AC

## 2019-08-06 NOTE — Patient Instructions (Signed)
Take only 1 thyroid pill daily  Take Novolog before meal

## 2019-08-06 NOTE — Progress Notes (Signed)
Patient ID: Abigail Hoover, female   DOB: 1945-08-23, 74 y.o.   MRN: [id number]    Reason for Appointment:  follow-up of various issues   History of Present Illness   Diagnosis: Type 2 DIABETES MELITUS, long-standing  She has had mild diabetes for several years but has required multiple drugs for control Usually has had postprandial hyperglycemia, this has been better controlled with adding Glyset to her main meals Has never been on insulin or a GLP-1 drug Actos had been stopped previously because of tendency to edema  Recent history: NovoLog 8 units before breakfast and 9-10 dinner, 3  units for afternoon snack   Oral hypoglycemic drugs: Januvia 25 mg daily  INSULIN regimen: NovoLog 10 units before breakfast and dinner, 5 to 6 units before snack  Her A1c is about the same at 7.6  Fructosamine previously was at 280 and now 294  Current management, problems identified and blood sugar patterns:  She has the wrong time is programmed on her meter  Rarely checking fasting blood sugars  However blood sugars after meals especially evening meal are quite variable and mostly high  She is not able to estimate how much insulin she needs to cover her evening meal and sometimes will have high readings after other meals also  She thinks she is taking Januvia as directed, the dose has been reduced because of creatinine clearance below 30  As before she is not always getting enough protein, only is eating lentils for protein and does not like to use yogurt or cheeses  No hypoglycemia with lowest blood sugar 83  Not doing any walking for exercise  Her weight is again slightly lower  Side effects from medications: None Proper timing of medications in relation to meals: Yes.          Monitors blood glucose: 3-4 times a day    Glucometer: One Touch        Blood Glucose readings from download    PRE-MEAL Fasting Lunch Dinner Bedtime Overall  Glucose range:  129     83-382    Mean/median:      160   POST-MEAL PC Breakfast PC Lunch PC Dinner  Glucose range:   116-233  83-382  Mean/median:  122  171  183   Previous readings:  PRE-MEAL Fasting Lunch Dinner Bedtime Overall  Glucose range:  117-146   135-244    Mean/median:  131  156  186   139+/-50   POST-MEAL PC Breakfast PC Lunch PC Dinner  Glucose range:    63-202  Mean/median:    97        Meals: 2- 3 meals per day.   Generally eating small breakfast and lunch but with only little protein, is vegetarian   EXERCISE:  None, only activity is housework            Dietician visit: Most recent: None   Wt Readings from Last 3 Encounters:  08/06/19 126 lb 12.8 oz (57.5 kg)  07/13/19 128 lb (58.1 kg)  05/11/19 132 lb 1.6 oz (59.9 kg)   Lab Results  Component Value Date   HGBA1C 7.6 (H) 07/30/2019   HGBA1C 7.7 (H) 04/29/2019   HGBA1C 6.6 (H) 01/27/2019   Lab Results  Component Value Date   MICROALBUR 1.0 10/30/2018   LDLCALC 76 07/30/2019   CREATININE 1.74 (H) 07/30/2019      Lab Results  Component Value Date   FRUCTOSAMINE 294 (H) 07/30/2019   FRUCTOSAMINE 280  12/16/2018   FRUCTOSAMINE 293 (H) 06/15/2018   FRUCTOSAMINE 269 01/08/2018     OTHER problems Addressed today are in review of systems:    Allergies as of 08/06/2019      Reactions   Atorvastatin Rash   Ezetimibe Itching      Medication List       Accurate as of Aug 06, 2019 11:59 PM. If you have any questions, ask your nurse or doctor.        aspirin EC 81 MG tablet Take 81 mg by mouth daily. Take 1 tablet by mouth once daily.   Carafate 1 GM/10ML suspension Generic drug: sucralfate TAKE 10ML BY MOUTH 4 (FOUR) TIMES A DAY BETWEEN MEALS AND AT BEDTIME   Dexilant 60 MG capsule Generic drug: dexlansoprazole Take 60 mg by mouth daily. Take 1 tablet by mouth once daily.   Elmiron 100 MG capsule Generic drug: pentosan polysulfate TAKE 1 CAPSULE (100 MG TOTAL) BY MOUTH 3 TIMES DAILY BEFORE MEALS.   furosemide 40  MG tablet Commonly known as: LASIX Take 40 mg by mouth. Take 1 tablet by mouth three times per week.   gabapentin 300 MG capsule Commonly known as: NEURONTIN Take by mouth.   hydrocortisone 2.5 % ointment APPLY TO AFFECED ARES(S) TOPICALLY TWICE DAILY AS NEEDED   ibandronate 150 MG tablet Commonly known as: BONIVA Take 150 mg by mouth every 30 (thirty) days. Take 1 tablet by mouth once per month.   Insulin Pen Needle 31G X 5 MM Misc Use with Novolog   Insulin Syringe-Needle U-100 25G X 5/8" 1 ML Misc USE DAILY TO INJECT INSULIN   isosorbide mononitrate 120 MG 24 hr tablet Commonly known as: IMDUR Take 120 mg by mouth daily. Take 1 tablet by mouth once daily.   Januvia 25 MG tablet Generic drug: sitaGLIPtin TAKE 1 TABLET (25 MG TOTAL) BY MOUTH DAILY.   metoprolol succinate 50 MG 24 hr tablet Commonly known as: TOPROL-XL Take 1 tablet (50 mg total) by mouth daily. Take with or immediately following a meal.   Myrbetriq 50 MG Tb24 tablet Generic drug: mirabegron ER Take by mouth.   nitroGLYCERIN 0.4 MG SL tablet Commonly known as: NITROSTAT Place 1 tablet (0.4 mg total) under the tongue every 5 (five) minutes x 3 doses as needed for chest pain.   NovoLOG FlexPen 100 UNIT/ML FlexPen Generic drug: insulin aspart INJECT 12 UNITS UNDER THE SKIN BEFORE BREAKFAST, 4 TO 5 UNITS BEFORE LUNCH, AND 12 UNITS BEFORE DINNER.   ONE TOUCH DELICA LANCING DEV Misc 1 each by Does not apply route daily. Use as instructed to check blood sugar once daily.   OneTouch Delica Plus [id number] Misc TEST BLOOD SUGAR ONCE DAILY   OneTouch Verio test strip Generic drug: glucose blood Use Onetouch verio test strips to check blood sugar three times daily. (90 day RX)   pravastatin 20 MG tablet Commonly known as: PRAVACHOL Take 1 tablet (20 mg total) by mouth daily.   repaglinide 1 MG tablet Commonly known as: PRANDIN Take 1 mg by mouth 2 (two) times daily.   Synthroid 75 MCG  tablet Generic drug: levothyroxine TAKE 1 TABLET EVERY DAY   triamcinolone ointment 0.1 % Commonly known as: KENALOG APPLY THIN LAYER ON THE SKIN TWICE A DAY FOR 3 WEEKS       Allergies:  Allergies  Allergen Reactions  . Atorvastatin Rash  . Ezetimibe Itching    Past Medical History:  Diagnosis Date  . Anemia in chronic kidney  disease 05/01/2015  . Anxiety   . Arthritis   . Bladder irritation   . Diabetes mellitus   . GERD (gastroesophageal reflux disease)   . Hypercholesterolemia   . Hypertension   . Hypothyroidism   . Iron deficiency anemia, unspecified   . Psoriasis   . Transfusion history 03/05/2016   for postoperative (ORIF) anemia superimposed on chronic anemia    Past Surgical History:  Procedure Laterality Date  . ABDOMINAL HYSTERECTOMY     TAH  . BREAST BIOPSY     Left breast biopsy benign lesion  . BREAST BIOPSY  03/11/2012   Procedure: BREAST BIOPSY;  Surgeon: Odis Hollingshead, MD;  Location: [facility];  Service: General;  Laterality: Left;  remove left breast mass  . BREAST EXCISIONAL BIOPSY Left   . CARDIAC CATHETERIZATION N/A 01/02/2015   Procedure: Left Heart Cath and Coronary Angiography;  Surgeon: Charolette Forward, MD;  Location: [facility];  Service: Cardiovascular;  Laterality: N/A;  . CATARACT EXTRACTION    . CERVICAL DISC SURGERY    . COLONOSCOPY  2013   neg. next 2023.   [facility] Kitchen EYE SURGERY    . LEFT HIP SURGERY    . TOTAL HIP ARTHROPLASTY Left 03/02/2016   Procedure: TOTAL HIP ARTHROPLASTY ANTERIOR APPROACH;  Surgeon: Rod Can, MD;  Location: [facility];  Service: Orthopedics;  Laterality: Left;    Family History  Problem Relation Age of Onset  . Heart disease Mother        heart attack  . Stroke Father        brain hemorrhage  . Diabetes Brother   . Cancer Brother   . Breast cancer Sister   . Cancer Sister        breast cancer    Social History:  reports that she has never smoked. She has never used smokeless tobacco. She reports  that she does not drink alcohol or use drugs.  Review of Systems:     HYPERTENSION: Mild, this is well-controlled with metoprolol 50 mg only Followed by cardiologist  BP Readings from Last 3 Encounters:  08/06/19 (!) 144/60  07/13/19 (!) 130/58  05/11/19 (!) 119/38     RENAL dysfunction has been variable and followed by nephrologist every 3 months No hyperkalemia   Also takes Lasix for edema  Lab Results  Component Value Date   CREATININE 1.74 (H) 07/30/2019   CREATININE 1.66 (H) 04/29/2019   CREATININE 1.64 (H) 01/27/2019     HYPERLIPIDEMIA: The lipid abnormality consists of elevated LDL which is mild and well controlled with pravastatin 20 mg LDL consistently below 100 History of possible rash from Lipitor  Lab Results  Component Value Date   CHOL 155 07/30/2019   HDL 57.00 07/30/2019   LDLCALC 76 07/30/2019   TRIG 110.0 07/30/2019   CHOLHDL 3 07/30/2019    She has a long history of primary HYPOTHYROIDISM   She takes SYNTHROID brand name 75 mcg daily before breakfast with extra tablet once a week  Previously TSH has been fairly consistent but now relatively low Also lost weight in the last few months  She has not changed the way she is taking her supplement    Lab Results  Component Value Date   TSH 0.20 (L) 07/30/2019   TSH 2.56 04/29/2019   TSH 1.97 01/27/2019   FREET4 1.83 (H) 07/30/2019   FREET4 1.28 04/29/2019   FREET4 1.56 12/16/2018     Has been taking gabapentin because of bladder difficulties as  recommended by a urologist     Diabetic foot exam in 2/21: Normal monofilament sensation  No history of microalbuminuria  OSTEOPENIA: Her T score Previously done by gynecologist was -2.1, continues on [place] started in 01/2014  Last bone density was -2.6 in 2/18, this was ordered by her gastroenterologist    Examination:   BP (!) 144/60 (BP Location: Left Arm, Patient Position: Sitting, Cuff Size: Normal)   Pulse 67   Ht 5\' 1"  (1.549 m)    Wt 126 lb 12.8 oz (57.5 kg)   SpO2 97%   BMI 23.96 kg/m   Body mass index is 23.96 kg/m.     ASSESSMENT/ PLAN:    Diabetes type 2, Nonobese   See history of present illness for detailed discussion of current diabetes management, blood sugar patterns and problems identified  Her A1c is 7.6 although fructosamine relatively good at 294  She again has postprandial hyperglycemia despite taking mealtime insulin Postprandial readings are quite variable especially at dinnertime with a range between 83 and 382 This is dependent on her carbohydrate intake and her assessment of her mealtime dose based on the diet Difficult to get her to get enough protein at each meal and she likely will not benefit from consultation with dietitian because of eating [place] food Also not exercising Tends to lose weight for unknown reasons  Fasting readings had been better with Januvia but she has only checked rarely   Recommendations:  She will need to add more protein to her meals and she can have some cardiac changes if she does not like yogurt  Also make sure she has some beans or lentils with each meal  Avoid large carbohydrates for snacks also  Make sure she takes her NovoLog before starting to eat which she is not always doing  RENAL dysfunction: This is overall about the same, no hyperkalemia, she will continue with her nephrologist  Hypothyroidism: Previously controlled with slightly low TSH now She will start taking her Synthroid 7 tablets weekly instead of 8 Considering her age her TSH may be appropriate hyper normal  Hypercholesterolemia LDL excellent at 76  Follow-up in 3 months    Patient Instructions  Take only 1 thyroid pill daily  Take Novolog before meal     Elayne Snare 08/08/2019, 3:20 PM

## 2019-08-10 ENCOUNTER — Telehealth: Payer: Self-pay

## 2019-08-10 ENCOUNTER — Other Ambulatory Visit: Payer: Self-pay

## 2019-08-10 DIAGNOSIS — D631 Anemia in chronic kidney disease: Secondary | ICD-10-CM | POA: Diagnosis not present

## 2019-08-10 DIAGNOSIS — N1832 Chronic kidney disease, stage 3b: Secondary | ICD-10-CM | POA: Diagnosis not present

## 2019-08-10 DIAGNOSIS — E1165 Type 2 diabetes mellitus with hyperglycemia: Secondary | ICD-10-CM

## 2019-08-10 DIAGNOSIS — I129 Hypertensive chronic kidney disease with stage 1 through stage 4 chronic kidney disease, or unspecified chronic kidney disease: Secondary | ICD-10-CM | POA: Diagnosis not present

## 2019-08-10 DIAGNOSIS — N2581 Secondary hyperparathyroidism of renal origin: Secondary | ICD-10-CM | POA: Diagnosis not present

## 2019-08-10 MED ORDER — INSULIN LISPRO (1 UNIT DIAL) 100 UNIT/ML (KWIKPEN): PEN_INJECTOR | SUBCUTANEOUS | 2 refills | Status: DC

## 2019-08-10 NOTE — Telephone Encounter (Signed)
Humalog KwikPen will be same dose NovoLog

## 2019-08-10 NOTE — Telephone Encounter (Signed)
Please review and advise.   Patient wants to stay on Novolog Flexpen

## 2019-08-10 NOTE — Telephone Encounter (Signed)
Carleeta from [facility] calling to discuss Novolog flexpen this is a non-formulary-I advised that this will probably just be switched to Humalog and I am sending the request to the nurse

## 2019-08-10 NOTE — Telephone Encounter (Signed)
Let patient know that we cannot override insurance unless she wants to pay more for the NovoLog

## 2019-08-10 NOTE — Telephone Encounter (Signed)
Spoke to [employer] and she states that she is ok with changing to Humalog.  She states whatever you think is best for her.  She states that she needs the new one to be sent to the pharmacy.

## 2019-08-10 NOTE — Telephone Encounter (Signed)
Humalog kwik pen sent into the pharmacy

## 2019-08-18 ENCOUNTER — Ambulatory Visit: Payer: Medicare Other | Admitting: Podiatry

## 2019-08-18 ENCOUNTER — Encounter: Payer: Self-pay | Admitting: Podiatry

## 2019-08-18 ENCOUNTER — Other Ambulatory Visit: Payer: Self-pay

## 2019-08-18 VITALS — Temp 97.2°F

## 2019-08-18 DIAGNOSIS — Z794 Long term (current) use of insulin: Secondary | ICD-10-CM

## 2019-08-18 DIAGNOSIS — N183 Chronic kidney disease, stage 3 unspecified: Secondary | ICD-10-CM

## 2019-08-18 DIAGNOSIS — M79675 Pain in left toe(s): Secondary | ICD-10-CM | POA: Diagnosis not present

## 2019-08-18 DIAGNOSIS — B351 Tinea unguium: Secondary | ICD-10-CM | POA: Diagnosis not present

## 2019-08-18 DIAGNOSIS — M79674 Pain in right toe(s): Secondary | ICD-10-CM

## 2019-08-18 DIAGNOSIS — E1122 Type 2 diabetes mellitus with diabetic chronic kidney disease: Secondary | ICD-10-CM

## 2019-08-18 NOTE — Patient Instructions (Addendum)
Edema  Edema is an abnormal buildup of fluids in the body tissues and under the skin. Swelling of the legs, feet, and ankles is a common symptom that becomes more likely as you get older. Swelling is also common in looser tissues, like around the eyes. When the affected area is squeezed, the fluid may move out of that spot and leave a dent for a few moments. This dent is called pitting edema. There are many possible causes of edema. Eating too much salt (sodium) and being on your feet or sitting for a long time can cause edema in your legs, feet, and ankles. Hot weather may make edema worse. Common causes of edema include:  Heart failure.  Liver or kidney disease.  Weak leg blood vessels.  Cancer.  An injury.  Pregnancy.  Medicines.  Being obese.  Low protein levels in the blood. Edema is usually painless. Your skin may look swollen or shiny. Follow these instructions at home:  Keep the affected body part raised (elevated) above the level of your heart when you are sitting or lying down.  Do not sit still or stand for long periods of time.  Do not wear tight clothing. Do not wear garters on your upper legs.  Exercise your legs to get your circulation going. This helps to move the fluid back into your blood vessels, and it may help the swelling go down.  Wear elastic bandages or support stockings to reduce swelling as told by your health care provider.  Eat a low-salt (low-sodium) diet to reduce fluid as told by your health care provider.  Depending on the cause of your swelling, you may need to limit how much fluid you drink (fluid restriction).  Take over-the-counter and prescription medicines only as told by your health care provider. Contact a health care provider if:  Your edema does not get better with treatment.  You have heart, liver, or kidney disease and have symptoms of edema.  You have sudden and unexplained weight gain. Get help right away if:  You develop  shortness of breath or chest pain.  You cannot breathe when you lie down.  You develop pain, redness, or warmth in the swollen areas.  You have heart, liver, or kidney disease and suddenly get edema.  You have a fever and your symptoms suddenly get worse. Summary  Edema is an abnormal buildup of fluids in the body tissues and under the skin.  Eating too much salt (sodium) and being on your feet or sitting for a long time can cause edema in your legs, feet, and ankles.  Keep the affected body part raised (elevated) above the level of your heart when you are sitting or lying down. This information is not intended to replace advice given to you by your health care provider. Make sure you discuss any questions you have with your health care provider. Document Revised: 07/29/2018 Document Reviewed: 04/13/2016 Elsevier Patient Education  [place].   Diabetes Mellitus and [place] care is an important part of your health, especially when you have diabetes. Diabetes may cause you to have problems because of poor blood flow (circulation) to your feet and legs, which can cause your skin to:  Become thinner and drier.  Break more easily.  Heal more slowly.  Peel and crack. You may also have nerve damage (neuropathy) in your legs and feet, causing decreased feeling in them. This means that you may not notice minor injuries to your feet that could  lead to more serious problems. Noticing and addressing any potential problems early is the best way to prevent future foot problems. How to care for your feet Foot hygiene  Wash your feet daily with warm water and mild soap. Do not use hot water. Then, pat your feet and the areas between your toes until they are completely dry. Do not soak your feet as this can dry your skin.  Trim your toenails straight across. Do not dig under them or around the cuticle. File the edges of your nails with an emery board or nail file.  Apply a  moisturizing lotion or petroleum jelly to the skin on your feet and to dry, brittle toenails. Use lotion that does not contain alcohol and is unscented. Do not apply lotion between your toes. Shoes and socks  Wear clean socks or stockings every day. Make sure they are not too tight. Do not wear knee-high stockings since they may decrease blood flow to your legs.  Wear shoes that fit properly and have enough cushioning. Always look in your shoes before you put them on to be sure there are no objects inside.  To break in new shoes, wear them for just a few hours a day. This prevents injuries on your feet. Wounds, scrapes, corns, and calluses  Check your feet daily for blisters, cuts, bruises, sores, and redness. If you cannot see the bottom of your feet, use a mirror or ask someone for help.  Do not cut corns or calluses or try to remove them with medicine.  If you find a minor scrape, cut, or break in the skin on your feet, keep it and the skin around it clean and dry. You may clean these areas with mild soap and water. Do not clean the area with peroxide, alcohol, or iodine.  If you have a wound, scrape, corn, or callus on your foot, look at it several times a day to make sure it is healing and not infected. Check for: ? Redness, swelling, or pain. ? Fluid or blood. ? Warmth. ? Pus or a bad smell. General instructions  Do not cross your legs. This may decrease blood flow to your feet.  Do not use heating pads or hot water bottles on your feet. They may burn your skin. If you have lost feeling in your feet or legs, you may not know this is happening until it is too late.  Protect your feet from hot and cold by wearing shoes, such as at the beach or on hot pavement.  Schedule a complete foot exam at least once a year (annually) or more often if you have foot problems. If you have foot problems, report any cuts, sores, or bruises to your health care provider immediately. Contact a health  care provider if:  You have a medical condition that increases your risk of infection and you have any cuts, sores, or bruises on your feet.  You have an injury that is not healing.  You have redness on your legs or feet.  You feel burning or tingling in your legs or feet.  You have pain or cramps in your legs and feet.  Your legs or feet are numb.  Your feet always feel cold.  You have pain around a toenail. Get help right away if:  You have a wound, scrape, corn, or callus on your foot and: ? You have pain, swelling, or redness that gets worse. ? You have fluid or blood coming from the wound,  scrape, corn, or callus. ? Your wound, scrape, corn, or callus feels warm to the touch. ? You have pus or a bad smell coming from the wound, scrape, corn, or callus. ? You have a fever. ? You have a red line going up your leg. Summary  Check your feet every day for cuts, sores, red spots, swelling, and blisters.  Moisturize feet and legs daily.  Wear shoes that fit properly and have enough cushioning.  If you have foot problems, report any cuts, sores, or bruises to your health care provider immediately.  Schedule a complete foot exam at least once a year (annually) or more often if you have foot problems. This information is not intended to replace advice given to you by your health care provider. Make sure you discuss any questions you have with your health care provider. Document Revised: 12/02/2018 Document Reviewed: 04/12/2016 Elsevier Patient Education  [place].

## 2019-08-19 ENCOUNTER — Inpatient Hospital Stay: Payer: Medicare Other

## 2019-08-19 ENCOUNTER — Inpatient Hospital Stay: Payer: Medicare Other | Admitting: Hematology and Oncology

## 2019-08-19 DIAGNOSIS — M170 Bilateral primary osteoarthritis of knee: Secondary | ICD-10-CM | POA: Diagnosis not present

## 2019-08-19 DIAGNOSIS — M25561 Pain in right knee: Secondary | ICD-10-CM | POA: Diagnosis not present

## 2019-08-19 DIAGNOSIS — M545 Low back pain: Secondary | ICD-10-CM | POA: Diagnosis not present

## 2019-08-20 ENCOUNTER — Encounter: Payer: Self-pay | Admitting: Hematology and Oncology

## 2019-08-20 ENCOUNTER — Inpatient Hospital Stay (HOSPITAL_BASED_OUTPATIENT_CLINIC_OR_DEPARTMENT_OTHER): Payer: Medicare Other | Admitting: Hematology and Oncology

## 2019-08-20 ENCOUNTER — Inpatient Hospital Stay: Payer: Medicare Other | Attending: Hematology and Oncology

## 2019-08-20 ENCOUNTER — Inpatient Hospital Stay: Payer: Medicare Other

## 2019-08-20 ENCOUNTER — Other Ambulatory Visit: Payer: Self-pay

## 2019-08-20 DIAGNOSIS — N183 Chronic kidney disease, stage 3 unspecified: Secondary | ICD-10-CM | POA: Diagnosis not present

## 2019-08-20 DIAGNOSIS — Z803 Family history of malignant neoplasm of breast: Secondary | ICD-10-CM | POA: Insufficient documentation

## 2019-08-20 DIAGNOSIS — N189 Chronic kidney disease, unspecified: Secondary | ICD-10-CM

## 2019-08-20 DIAGNOSIS — E538 Deficiency of other specified B group vitamins: Secondary | ICD-10-CM

## 2019-08-20 DIAGNOSIS — Z9189 Other specified personal risk factors, not elsewhere classified: Secondary | ICD-10-CM | POA: Diagnosis not present

## 2019-08-20 DIAGNOSIS — D631 Anemia in chronic kidney disease: Secondary | ICD-10-CM

## 2019-08-20 DIAGNOSIS — Z79899 Other long term (current) drug therapy: Secondary | ICD-10-CM | POA: Insufficient documentation

## 2019-08-20 LAB — CBC WITH DIFFERENTIAL/PLATELET
Abs Immature Granulocytes: 0.06 10*3/uL (ref 0.00–0.07)
Basophils Absolute: 0 10*3/uL (ref 0.0–0.1)
Basophils Relative: 0 %
Eosinophils Absolute: 0 10*3/uL (ref 0.0–0.5)
Eosinophils Relative: 0 %
HCT: 31.8 % — ABNORMAL LOW (ref 36.0–46.0)
Hemoglobin: 9.7 g/dL — ABNORMAL LOW (ref 12.0–15.0)
Immature Granulocytes: 1 %
Lymphocytes Relative: 10 %
Lymphs Abs: 0.8 10*3/uL (ref 0.7–4.0)
MCH: 25.7 pg — ABNORMAL LOW (ref 26.0–34.0)
MCHC: 30.5 g/dL (ref 30.0–36.0)
MCV: 84.1 fL (ref 80.0–100.0)
Monocytes Absolute: 0.2 10*3/uL (ref 0.1–1.0)
Monocytes Relative: 2 %
Neutro Abs: 7.3 10*3/uL (ref 1.7–7.7)
Neutrophils Relative %: 87 %
Platelets: 256 10*3/uL (ref 150–400)
RBC: 3.78 MIL/uL — ABNORMAL LOW (ref 3.87–5.11)
RDW: 15.2 % (ref 11.5–15.5)
WBC: 8.3 10*3/uL (ref 4.0–10.5)
nRBC: 0 % (ref 0.0–0.2)

## 2019-08-20 MED ORDER — DARBEPOETIN ALFA 200 MCG/0.4ML IJ SOSY
PREFILLED_SYRINGE | INTRAMUSCULAR | Status: AC
  Filled 2019-08-20: qty 0.4

## 2019-08-20 MED ORDER — CYANOCOBALAMIN 1000 MCG/ML IJ SOLN
1000.0000 ug | Freq: Once | INTRAMUSCULAR | Status: AC
  Administered 2019-08-20: 1000 ug via INTRAMUSCULAR

## 2019-08-20 MED ORDER — DARBEPOETIN ALFA 200 MCG/0.4ML IJ SOSY
200.0000 ug | PREFILLED_SYRINGE | Freq: Once | INTRAMUSCULAR | Status: AC
  Administered 2019-08-20: 200 ug via SUBCUTANEOUS

## 2019-08-20 MED ORDER — CYANOCOBALAMIN 1000 MCG/ML IJ SOLN
INTRAMUSCULAR | Status: AC
  Filled 2019-08-20: qty 1

## 2019-08-20 NOTE — Assessment & Plan Note (Signed)
She has received B12 injections Her last B12 level was satisfactory She will continue to receive B12 injection with plan to recheck her blood work again in October of this year

## 2019-08-20 NOTE — Assessment & Plan Note (Signed)
She had negative mammogram in November She will continue close follow-up Examination today is benign

## 2019-08-20 NOTE — Assessment & Plan Note (Signed)
We discussed some of the risks, benefits, and alternatives of erythropoietin stimulating agents such as Procrit or Aranesp.  She tolerated injections well  Since we space out her injection appointment, hemoglobin is now consistently less than 10 I plan to see her again in 2 months for further follow-up The goal is to keep hemoglobin at greater than 11

## 2019-08-20 NOTE — Patient Instructions (Signed)
Cyanocobalamin, Vitamin B12 injection What is this medicine? CYANOCOBALAMIN (sye an oh koe BAL a min) is a man made form of vitamin B12. Vitamin B12 is used in the growth of healthy blood cells, nerve cells, and proteins in the body. It also helps with the metabolism of fats and carbohydrates. This medicine is used to treat people who can not absorb vitamin B12. This medicine may be used for other purposes; ask your health care provider or pharmacist if you have questions. COMMON BRAND NAME(S): B-12 Compliance Kit, B-12 Injection Kit, Cyomin, LA-12, Nutri-Twelve, Physicians EZ Use B-12, Primabalt What should I tell my health care provider before I take this medicine? They need to know if you have any of these conditions:  kidney disease  Leber's disease  megaloblastic anemia  an unusual or allergic reaction to cyanocobalamin, cobalt, other medicines, foods, dyes, or preservatives  pregnant or trying to get pregnant  breast-feeding How should I use this medicine? This medicine is injected into a muscle or deeply under the skin. It is usually given by a health care professional in a clinic or doctor's office. However, your doctor may teach you how to inject yourself. Follow all instructions. Talk to your pediatrician regarding the use of this medicine in children. Special care may be needed. Overdosage: If you think you have taken too much of this medicine contact a poison control center or emergency room at once. NOTE: This medicine is only for you. Do not share this medicine with others. What if I miss a dose? If you are given your dose at a clinic or doctor's office, call to reschedule your appointment. If you give your own injections and you miss a dose, take it as soon as you can. If it is almost time for your next dose, take only that dose. Do not take double or extra doses. What may interact with this medicine?  colchicine  heavy alcohol intake This list may not describe all  possible interactions. Give your health care provider a list of all the medicines, herbs, non-prescription drugs, or dietary supplements you use. Also tell them if you smoke, drink alcohol, or use illegal drugs. Some items may interact with your medicine. What should I watch for while using this medicine? Visit your doctor or health care professional regularly. You may need blood work done while you are taking this medicine. You may need to follow a special diet. Talk to your doctor. Limit your alcohol intake and avoid smoking to get the best benefit. What side effects may I notice from receiving this medicine? Side effects that you should report to your doctor or health care professional as soon as possible:  allergic reactions like skin rash, itching or hives, swelling of the face, lips, or tongue  blue tint to skin  chest tightness, pain  difficulty breathing, wheezing  dizziness  red, swollen painful area on the leg Side effects that usually do not require medical attention (report to your doctor or health care professional if they continue or are bothersome):  diarrhea  headache This list may not describe all possible side effects. Call your doctor for medical advice about side effects. You may report side effects to FDA at 1-800-FDA-1088. Where should I keep my medicine? Keep out of the reach of children. Store at room temperature between 15 and 30 degrees C (59 and 85 degrees F). Protect from light. Throw away any unused medicine after the expiration date. NOTE: This sheet is a summary. It may not cover  all possible information. If you have questions about this medicine, talk to your doctor, pharmacist, or health care provider.  2020 Elsevier/Gold Standard (2007-06-22 22:10:20) Darbepoetin Alfa injection What is this medicine? DARBEPOETIN ALFA (dar be POE e tin AL fa) helps your body make more red blood cells. It is used to treat anemia caused by chronic kidney failure and  chemotherapy. This medicine may be used for other purposes; ask your health care provider or pharmacist if you have questions. COMMON BRAND NAME(S): Aranesp What should I tell my health care provider before I take this medicine? They need to know if you have any of these conditions:  blood clotting disorders or history of blood clots  cancer patient not on chemotherapy  cystic fibrosis  heart disease, such as angina, heart failure, or a history of a heart attack  hemoglobin level of 12 g/dL or greater  high blood pressure  low levels of folate, iron, or vitamin B12  seizures  an unusual or allergic reaction to darbepoetin, erythropoietin, albumin, hamster proteins, latex, other medicines, foods, dyes, or preservatives  pregnant or trying to get pregnant  breast-feeding How should I use this medicine? This medicine is for injection into a vein or under the skin. It is usually given by a health care professional in a hospital or clinic setting. If you get this medicine at home, you will be taught how to prepare and give this medicine. Use exactly as directed. Take your medicine at regular intervals. Do not take your medicine more often than directed. It is important that you put your used needles and syringes in a special sharps container. Do not put them in a trash can. If you do not have a sharps container, call your pharmacist or healthcare provider to get one. A special MedGuide will be given to you by the pharmacist with each prescription and refill. Be sure to read this information carefully each time. Talk to your pediatrician regarding the use of this medicine in children. While this medicine may be used in children as young as 1 month of age for selected conditions, precautions do apply. Overdosage: If you think you have taken too much of this medicine contact a poison control center or emergency room at once. NOTE: This medicine is only for you. Do not share this medicine  with others. What if I miss a dose? If you miss a dose, take it as soon as you can. If it is almost time for your next dose, take only that dose. Do not take double or extra doses. What may interact with this medicine? Do not take this medicine with any of the following medications:  epoetin alfa This list may not describe all possible interactions. Give your health care provider a list of all the medicines, herbs, non-prescription drugs, or dietary supplements you use. Also tell them if you smoke, drink alcohol, or use illegal drugs. Some items may interact with your medicine. What should I watch for while using this medicine? Your condition will be monitored carefully while you are receiving this medicine. You may need blood work done while you are taking this medicine. This medicine may cause a decrease in vitamin B6. You should make sure that you get enough vitamin B6 while you are taking this medicine. Discuss the foods you eat and the vitamins you take with your health care professional. What side effects may I notice from receiving this medicine? Side effects that you should report to your doctor or health care professional   as soon as possible:  allergic reactions like skin rash, itching or hives, swelling of the face, lips, or tongue  breathing problems  changes in vision  chest pain  confusion, trouble speaking or understanding  feeling faint or lightheaded, falls  high blood pressure  muscle aches or pains  pain, swelling, warmth in the leg  rapid weight gain  severe headaches  sudden numbness or weakness of the face, arm or leg  trouble walking, dizziness, loss of balance or coordination  seizures (convulsions)  swelling of the ankles, feet, hands  unusually weak or tired Side effects that usually do not require medical attention (report to your doctor or health care professional if they continue or are bothersome):  diarrhea  fever, chills (flu-like  symptoms)  headaches  nausea, vomiting  redness, stinging, or swelling at site where injected This list may not describe all possible side effects. Call your doctor for medical advice about side effects. You may report side effects to FDA at 1-800-FDA-1088. Where should I keep my medicine? Keep out of the reach of children. Store in a refrigerator between 2 and 8 degrees C (36 and 46 degrees F). Do not freeze. Do not shake. Throw away any unused portion if using a single-dose vial. Throw away any unused medicine after the expiration date. NOTE: This sheet is a summary. It may not cover all possible information. If you have questions about this medicine, talk to your doctor, pharmacist, or health care provider.  2020 Elsevier/Gold Standard (2017-03-26 16:44:20)

## 2019-08-20 NOTE — Progress Notes (Signed)
[facility] OFFICE PROGRESS NOTE  Patient Care Team: System, Pcp Not In as PCP - General Elayne Snare, MD as Attending Physician (Endocrinology) Jackolyn Confer, MD as Attending Physician (General Surgery) Jovita Gamma, MD as Attending Physician (Neurosurgery) [name], MD as Consulting Physician (Hematology and Oncology)  ASSESSMENT & PLAN:  Anemia in chronic kidney disease We discussed some of the risks, benefits, and alternatives of erythropoietin stimulating agents such as Procrit or Aranesp.  She tolerated injections well  Since we space out her injection appointment, hemoglobin is now consistently less than 10 I plan to see her again in 2 months for further follow-up The goal is to keep hemoglobin at greater than 11  Vitamin B 12 deficiency She has received B12 injections Her last B12 level was satisfactory She will continue to receive B12 injection with plan to recheck her blood work again in October of this year  At high risk for breast cancer She had negative mammogram in November She will continue close follow-up Examination today is benign   No orders of the defined types were placed in this encounter.   All questions were answered. The patient knows to call the clinic with any problems, questions or concerns. The total time spent in the appointment was 20 minutes encounter with patients including review of chart and various tests results, discussions about plan of care and coordination of care plan   [name], MD 08/20/2019 3:22 PM  INTERVAL HISTORY: Please see below for problem oriented charting. She returns for further follow-up She complains of back pain and saw her orthopedic surgeon recently She is currently on insulin therapy for diabetes The patient denies any recent signs or symptoms of bleeding such as spontaneous epistaxis, hematuria or hematochezia. She denies any recent abnormal breast examination, palpable mass, abnormal  breast appearance or nipple changes  SUMMARY OF HEMATOLOGIC HISTORY:  This is a patient that has been followed here since 2011 for chronic anemia In 2011, she had a bone marrow aspirate and biopsy that came back normal. The patient has been receiving Aranesp injection for chronic anemia up until 2014. It was subsequently discontinued due stability of the anemia. Starting 05/27/2014, darbepoetin was resumed at 200 g every other month to keep hemoglobin greater than 10 g. Treatment was discontinued in October 2016 and resumed in September 2017 The patient is subsequently found to have severe vitamin B12 deficiency and is started on vitamin B12 injection on December 25, 2016 along with resumption of darbepoetin injection every 5 to 6 weeks.  She is also seen due to family history of breast cancer  REVIEW OF SYSTEMS:   Constitutional: Denies fevers, chills or abnormal weight loss Eyes: Denies blurriness of vision Ears, nose, mouth, throat, and face: Denies mucositis or sore throat Respiratory: Denies cough, dyspnea or wheezes Cardiovascular: Denies palpitation, chest discomfort or lower extremity swelling Gastrointestinal:  Denies nausea, heartburn or change in bowel habits Skin: Denies abnormal skin rashes Lymphatics: Denies new lymphadenopathy or easy bruising Neurological:Denies numbness, tingling or new weaknesses Behavioral/Psych: Mood is stable, no new changes  All other systems were reviewed with the patient and are negative.  I have reviewed the past medical history, past surgical history, social history and family history with the patient and they are unchanged from previous note.  ALLERGIES:  is allergic to atorvastatin and ezetimibe.  MEDICATIONS:  Current Outpatient Medications  Medication Sig Dispense Refill  . aspirin EC 81 MG tablet Take 81 mg by mouth daily. Take 1 tablet  by mouth once daily.    Marland Kitchen CARAFATE 1 GM/10ML suspension TAKE 10ML BY MOUTH 4 (FOUR) TIMES A DAY BETWEEN  MEALS AND AT BEDTIME    . DEXILANT 60 MG capsule Take 60 mg by mouth daily. Take 1 tablet by mouth once daily.    . furosemide (LASIX) 40 MG tablet Take 40 mg by mouth. Take 1 tablet by mouth three times per week.    . gabapentin (NEURONTIN) 300 MG capsule Take by mouth.    Marland Kitchen glucose blood (ONETOUCH VERIO) test strip Use Onetouch verio test strips to check blood sugar three times daily. (90 day RX) 300 each 8  . hydrocortisone 2.5 % ointment APPLY TO AFFECED ARES(S) TOPICALLY TWICE DAILY AS NEEDED  3  . ibandronate (BONIVA) 150 MG tablet Take 150 mg by mouth every 30 (thirty) days. Take 1 tablet by mouth once per month.    . insulin lispro (HUMALOG KWIKPEN) 100 UNIT/ML KwikPen Inject 12 units under skin before breakfast, 4-5 units before lunch, 12 units before dinner. 3 mL 2  . Insulin Pen Needle 31G X 5 MM MISC Use with Novolog 100 each 1  . Insulin Syringe-Needle U-100 25G X 5/8" 1 ML MISC USE DAILY TO INJECT INSULIN 100 each 3  . isosorbide mononitrate (IMDUR) 120 MG 24 hr tablet Take 120 mg by mouth daily. Take 1 tablet by mouth once daily.    Marland Kitchen JANUVIA 25 MG tablet TAKE 1 TABLET (25 MG TOTAL) BY MOUTH DAILY. 30 tablet 2  . Lancet Devices (ONE TOUCH DELICA LANCING DEV) MISC 1 each by Does not apply route daily. Use as instructed to check blood sugar once daily. 1 each 0  . Lancets (ONETOUCH DELICA PLUS [id number]) MISC TEST BLOOD SUGAR ONCE DAILY 100 each 2  . metoprolol succinate (TOPROL-XL) 50 MG 24 hr tablet Take 1 tablet (50 mg total) by mouth daily. Take with or immediately following a meal. 30 tablet 3  . mirabegron ER (MYRBETRIQ) 50 MG TB24 tablet Take by mouth.    . nitroGLYCERIN (NITROSTAT) 0.4 MG SL tablet Place 1 tablet (0.4 mg total) under the tongue every 5 (five) minutes x 3 doses as needed for chest pain. 25 tablet 12  . NOVOLOG FLEXPEN 100 UNIT/ML FlexPen INJECT 12 UNITS UNDER THE SKIN BEFORE BREAKFAST, 4 TO 5 UNITS BEFORE LUNCH, AND 12 UNITS BEFORE DINNER. 15 mL 2  . pentosan  polysulfate (ELMIRON) 100 MG capsule TAKE 1 CAPSULE (100 MG TOTAL) BY MOUTH 3 TIMES DAILY BEFORE MEALS.    Marland Kitchen pravastatin (PRAVACHOL) 20 MG tablet Take 1 tablet (20 mg total) by mouth daily. 90 tablet 1  . repaglinide (PRANDIN) 1 MG tablet Take 1 mg by mouth 2 (two) times daily.    Marland Kitchen SYNTHROID 75 MCG tablet TAKE 1 TABLET EVERY DAY 90 tablet 0  . triamcinolone ointment (KENALOG) 0.1 % APPLY THIN LAYER ON THE SKIN TWICE A DAY FOR 3 WEEKS  0   No current facility-administered medications for this visit.    PHYSICAL EXAMINATION: ECOG PERFORMANCE STATUS: 1 - Symptomatic but completely ambulatory  Vitals:   08/20/19 1217  BP: (!) 156/47  Pulse: 69  Resp: 18  Temp: 98 F (36.7 C)  SpO2: 100%   Filed Weights   08/20/19 1217  Weight: 128 lb 9.6 oz (58.3 kg)    GENERAL:alert, no distress and comfortable SKIN: skin color, texture, turgor are normal, no rashes or significant lesions EYES: normal, Conjunctiva are pink and non-injected, sclera clear OROPHARYNX:no exudate,  no erythema and lips, buccal mucosa, and tongue normal  NECK: supple, thyroid normal size, non-tender, without nodularity LYMPH:  no palpable lymphadenopathy in the cervical, axillary or inguinal LUNGS: clear to auscultation and percussion with normal breathing effort HEART: regular rate & rhythm and no murmurs and no lower extremity edema ABDOMEN:abdomen soft, non-tender and normal bowel sounds Musculoskeletal:no cyanosis of digits and no clubbing  NEURO: alert & oriented x 3 with fluent speech, no focal motor/sensory deficits Bilateral breast examination is performed No abnormalities noted  LABORATORY DATA:  I have reviewed the data as listed    Component Value Date/Time   NA 138 07/30/2019 1457   NA 138 12/12/2016 1150   K 4.3 07/30/2019 1457   K 4.6 12/12/2016 1150   CL 99 07/30/2019 1457   CL 103 07/27/2012 1259   CO2 32 07/30/2019 1457   CO2 31 (H) 12/12/2016 1150   GLUCOSE 175 (H) 07/30/2019 1457    GLUCOSE 149 (H) 12/12/2016 1150   GLUCOSE 118 (H) 07/27/2012 1259   BUN 33 (H) 07/30/2019 1457   BUN 23.4 12/12/2016 1150   CREATININE 1.74 (H) 07/30/2019 1457   CREATININE 1.7 (H) 12/12/2016 1150   CALCIUM 9.8 07/30/2019 1457   CALCIUM 11.4 (H) 12/12/2016 1150   PROT 6.4 04/29/2019 1451   PROT 6.5 12/12/2016 1150   ALBUMIN 4.0 04/29/2019 1451   ALBUMIN 3.9 12/12/2016 1150   AST 17 04/29/2019 1451   AST 19 12/12/2016 1150   ALT 10 04/29/2019 1451   ALT 10 12/12/2016 1150   ALKPHOS 68 04/29/2019 1451   ALKPHOS 65 12/12/2016 1150   BILITOT 0.3 04/29/2019 1451   BILITOT 0.36 12/12/2016 1150   GFRNONAA 52 (L) 03/04/2016 0528   GFRAA >60 03/04/2016 0528    No results found for: SPEP, UPEP  Lab Results  Component Value Date   WBC 8.3 08/20/2019   NEUTROABS 7.3 08/20/2019   HGB 9.7 (L) 08/20/2019   HCT 31.8 (L) 08/20/2019   MCV 84.1 08/20/2019   PLT 256 08/20/2019      Chemistry      Component Value Date/Time   NA 138 07/30/2019 1457   NA 138 12/12/2016 1150   K 4.3 07/30/2019 1457   K 4.6 12/12/2016 1150   CL 99 07/30/2019 1457   CL 103 07/27/2012 1259   CO2 32 07/30/2019 1457   CO2 31 (H) 12/12/2016 1150   BUN 33 (H) 07/30/2019 1457   BUN 23.4 12/12/2016 1150   CREATININE 1.74 (H) 07/30/2019 1457   CREATININE 1.7 (H) 12/12/2016 1150      Component Value Date/Time   CALCIUM 9.8 07/30/2019 1457   CALCIUM 11.4 (H) 12/12/2016 1150   ALKPHOS 68 04/29/2019 1451   ALKPHOS 65 12/12/2016 1150   AST 17 04/29/2019 1451   AST 19 12/12/2016 1150   ALT 10 04/29/2019 1451   ALT 10 12/12/2016 1150   BILITOT 0.3 04/29/2019 1451   BILITOT 0.36 12/12/2016 1150

## 2019-08-24 ENCOUNTER — Telehealth: Payer: Self-pay | Admitting: Hematology and Oncology

## 2019-08-24 NOTE — Telephone Encounter (Signed)
Scheduled appts per 5/28 sch msg. Pt confirmed appt date and time.

## 2019-08-25 NOTE — Telephone Encounter (Signed)
Patient called stating the Humalog has caused her sugar to increase - stated she was unsure as to why her medication was changed. I informed her of the reasoning as to why it was changed and she still seemed unsure - said her insurance will cover the Novolog, she requests to switch back to Novolog and asked if Noah/Dr could please give her a call. Ph# [phone]

## 2019-08-25 NOTE — Telephone Encounter (Signed)
Insurance will pay for Novolog after a PA is completed, however, I am unaware of a medically necessary reason as to why pt must have Novolog over Humalog.  Please advise on how you wish this to be handled.

## 2019-08-25 NOTE — Telephone Encounter (Signed)
If her sugars are over 180 after meals she can go up 2 units on the Humalog, however unable to justify getting NovoLog

## 2019-08-25 NOTE — Telephone Encounter (Signed)
Need to do PA on NovoLog, brand or generic, patient has failed Humalog with excessive high sugars

## 2019-08-25 NOTE — Telephone Encounter (Signed)
Before pt could be called and given this message, I received a call from [place] named Leighton Roach. Stating that the pt had called and reported an adverse reaction with humalog. Pt reports that the adverse reaction she experienced is high blood sugar. I attempted to explain to patient that sometimes the body requires more insulin, and it also makes a difference in what the pt eats. Patient refused to listen to this and stated that she takes enough insulin as it is, and she is not taking anymore. Seth Bake with [employer] did confirm that Novolog is a non formulary medication. Pt refuses to accept that Dr. Dwyane Dee will not prescribe Novolog due to hyperglycemia from Humalog. After approx 4-5 minutes of pt screaming over the phone, pt was informed that she needs to make an appt and discuss directly with the provider. Pt then refused to accept this as well. As a result, pt was informed that Dr. Dwyane Dee would call the pt directly. Pt verbalized understanding of this.

## 2019-08-26 ENCOUNTER — Other Ambulatory Visit: Payer: Self-pay

## 2019-08-26 ENCOUNTER — Encounter: Payer: Self-pay | Admitting: Rheumatology

## 2019-08-26 ENCOUNTER — Ambulatory Visit: Payer: Self-pay

## 2019-08-26 ENCOUNTER — Ambulatory Visit: Payer: Medicare Other | Admitting: Rheumatology

## 2019-08-26 VITALS — BP 104/50 | HR 74 | Resp 14 | Ht 61.0 in | Wt 125.0 lb

## 2019-08-26 DIAGNOSIS — M810 Age-related osteoporosis without current pathological fracture: Secondary | ICD-10-CM | POA: Insufficient documentation

## 2019-08-26 DIAGNOSIS — M170 Bilateral primary osteoarthritis of knee: Secondary | ICD-10-CM | POA: Insufficient documentation

## 2019-08-26 DIAGNOSIS — M19041 Primary osteoarthritis, right hand: Secondary | ICD-10-CM

## 2019-08-26 DIAGNOSIS — M1711 Unilateral primary osteoarthritis, right knee: Secondary | ICD-10-CM | POA: Diagnosis not present

## 2019-08-26 DIAGNOSIS — M19042 Primary osteoarthritis, left hand: Secondary | ICD-10-CM

## 2019-08-26 DIAGNOSIS — M1712 Unilateral primary osteoarthritis, left knee: Secondary | ICD-10-CM

## 2019-08-26 DIAGNOSIS — S72002A Fracture of unspecified part of neck of left femur, initial encounter for closed fracture: Secondary | ICD-10-CM

## 2019-08-26 DIAGNOSIS — L409 Psoriasis, unspecified: Secondary | ICD-10-CM

## 2019-08-26 DIAGNOSIS — E039 Hypothyroidism, unspecified: Secondary | ICD-10-CM

## 2019-08-26 DIAGNOSIS — Z8639 Personal history of other endocrine, nutritional and metabolic disease: Secondary | ICD-10-CM

## 2019-08-26 DIAGNOSIS — M1120 Other chondrocalcinosis, unspecified site: Secondary | ICD-10-CM

## 2019-08-26 DIAGNOSIS — N183 Chronic kidney disease, stage 3 unspecified: Secondary | ICD-10-CM

## 2019-08-26 DIAGNOSIS — I10 Essential (primary) hypertension: Secondary | ICD-10-CM

## 2019-08-26 DIAGNOSIS — D631 Anemia in chronic kidney disease: Secondary | ICD-10-CM

## 2019-08-26 DIAGNOSIS — E1149 Type 2 diabetes mellitus with other diabetic neurological complication: Secondary | ICD-10-CM

## 2019-08-26 DIAGNOSIS — M5136 Other intervertebral disc degeneration, lumbar region: Secondary | ICD-10-CM | POA: Insufficient documentation

## 2019-08-26 DIAGNOSIS — Z8781 Personal history of (healed) traumatic fracture: Secondary | ICD-10-CM

## 2019-08-26 MED ORDER — COLCHICINE 0.6 MG PO TABS
0.3000 mg | ORAL_TABLET | Freq: Every day | ORAL | 2 refills | Status: DC
Start: 2019-08-26 — End: 2019-11-01

## 2019-08-26 MED ORDER — NOVOLOG FLEXPEN 100 UNIT/ML ~~LOC~~ SOPN: PEN_INJECTOR | SUBCUTANEOUS | 2 refills | Status: DC

## 2019-08-26 NOTE — Telephone Encounter (Signed)
Called pt and gave her MD message. Pt verbalized understanding and stated that she would contact this office tomorrow and schedule a visit for 3 weeks.

## 2019-08-26 NOTE — Progress Notes (Addendum)
Office Visit Note  Patient: Abigail Hoover             Date of Birth: [birth year]           MRN: [id number]             PCP: [name], MD Referring: No ref. provider found Visit Date: 08/26/2019 Occupation: _0 @  Subjective:  Pain in multiple joints.   History of Present Illness: Abigail Hoover is a 74 y.o. female seen today per request of Dr. Collene Mares.  Patient was initially evaluated by me in 2004 at that time she had extensive work-up and was diagnosed with osteoarthritis of the bilateral hands and knee joints.  Patient came for the visit in 2011 with knee joint discomfort.  At that time she declined cortisone injection to her knee joints.  She has not been seen in the office since then.  She has had history of joint pain for many years.  She had been under care of Dr. Maryan Rued the past for frozen shoulders requiring physical therapy.  She continues to has discomfort in her bilateral hands due to underlying osteoarthritis.  She has chronic knee joint discomfort due to osteoarthritis in her knees.  She states she has been having severe pain in her lower back, left hip and her both knee joints.  She also describes pain in her bilateral lower extremity muscles.  She has been taking tramadol 1 tablet every 6 hours which is not helping her much with the pain.  She has longstanding history of psoriasis for which she uses topical agents.  She gives history of pedal edema but no joint swelling.  Activities of Daily Living:  Patient reports morning stiffness for 24 hours.   Patient Reports nocturnal pain.  Difficulty dressing/grooming: Denies Difficulty climbing stairs: Denies Difficulty getting out of chair: Reports Difficulty using hands for taps, buttons, cutlery, and/or writing: Reports  Review of Systems  Constitutional: Negative for fatigue.  HENT: Negative for mouth sores, mouth dryness and nose dryness.   Eyes: Negative for itching and dryness.  Respiratory: Negative for shortness  of breath and difficulty breathing.   Cardiovascular: Negative for chest pain and palpitations.  Gastrointestinal: Negative for blood in stool, constipation and diarrhea.  Endocrine: Negative for increased urination.  Genitourinary: Negative for difficulty urinating.  Musculoskeletal: Positive for arthralgias, joint pain, joint swelling and morning stiffness. Negative for myalgias, muscle tenderness and myalgias.  Skin: Negative for rash and redness.  Allergic/Immunologic: Negative for susceptible to infections.  Neurological: Positive for memory loss and weakness. Negative for dizziness, numbness and headaches.  Hematological: Positive for bruising/bleeding tendency.  Psychiatric/Behavioral: Negative for confusion.    PMFS History:  Patient Active Problem List   Diagnosis Date Noted  . DDD (degenerative disc disease), lumbar 08/26/2019  . Age-related osteoporosis without current pathological fracture 08/26/2019  . Primary osteoarthritis of both knees 08/26/2019  . Primary osteoarthritis of both hands 08/26/2019  . Diabetes mellitus with chronic kidney disease, with long-term current use of insulin ([place]) 05/11/2019  . Pain due to onychomycosis of toenails of both feet 09/30/2018  . Chronic pain of right thumb 08/27/2018  . Dry skin 03/12/2018  . Other fatigue 09/18/2017  . At high risk for breast cancer 03/06/2017  . Hypercalcemia 12/16/2016  . Vitamin B 12 deficiency 12/16/2016  . Displaced fracture of left femoral neck ([place]) 03/02/2016  . Hyperkalemia 03/01/2016  . Femoral neck fracture ([place]) 02/29/2016  . Anemia due to stage 3 (moderate) chronic renal failure treated  with erythropoietin 12/18/2015  . Chronic kidney disease, stage III (moderate) 08/14/2015  . 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 Acute coronary syndrome ([place]) 01/02/2015  . Deficiency anemia 05/27/2014  . Carpal tunnel syndrome 12/09/2013  . Diabetic neuropathy ([place]) 12/09/2013  . Type 2 diabetes  mellitus with neurological manifestations, controlled ([place]) 04/21/2013  . Disturbance of skin sensation 04/21/2013  . Hypothyroidism 10/30/2012  . Breast mass in female-left 03/10/2012  . Weight loss 11/28/2011  . Family history of breast cancer 08/01/2010  . Essential hypertension 02/01/2009  . EDEMA LEGS 02/01/2009    Past Medical History:  Diagnosis Date  . 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 Anxiety   . Arthritis   . Bladder irritation   . Diabetes mellitus   . GERD (gastroesophageal reflux disease)   . Hypercholesterolemia   . Hypertension   . Hypothyroidism   . Iron deficiency anemia, unspecified   . Psoriasis   . Transfusion history 03/05/2016   for postoperative (ORIF) anemia superimposed on chronic anemia    Family History  Problem Relation Age of Onset  . Heart disease Mother        heart attack  . Stroke Father        brain hemorrhage  . Diabetes Brother   . Breast cancer Sister   . Cancer Sister        breast cancer   Past Surgical History:  Procedure Laterality Date  . ABDOMINAL HYSTERECTOMY     TAH  . BREAST BIOPSY     Left breast biopsy benign lesion  . BREAST BIOPSY  03/11/2012   Procedure: BREAST BIOPSY;  Surgeon: Odis Hollingshead, MD;  Location: [facility];  Service: General;  Laterality: Left;  remove left breast mass  . BREAST EXCISIONAL BIOPSY Left   . CARDIAC CATHETERIZATION N/A 01/02/2015   Procedure: Left Heart Cath and Coronary Angiography;  Surgeon: Charolette Forward, MD;  Location: [facility];  Service: Cardiovascular;  Laterality: N/A;  . CATARACT EXTRACTION    . CERVICAL DISC SURGERY    . COLONOSCOPY  2013   neg. next 2023.   Marland Kitchen EYE SURGERY    . LEFT HIP SURGERY    . TOTAL HIP ARTHROPLASTY Left 03/02/2016   Procedure: TOTAL HIP ARTHROPLASTY ANTERIOR APPROACH;  Surgeon: Rod Can, MD;  Location: [facility];  Service: Orthopedics;  Laterality: Left;   Social History   Social History Narrative   Patient is married with one  child.   Patient is right handed.   Patient has college education.   Patient drinks tea, two times a day   Immunization History  Administered Date(s) Administered  . Fluad Quad(high Dose 65+) 12/22/2018  . Influenza Split 11/13/2010  . Influenza, High Dose Seasonal PF 01/25/2016, 12/24/2016  . Influenza,inj,Quad PF,6+ Mos 12/29/2012, 12/20/2013, 01/16/2015  . Pneumococcal Conjugate-13 01/16/2015  . Pneumococcal Polysaccharide-23 03/24/2013     Objective: Vital Signs: BP (!) 104/50 (BP Location: Left Arm, Patient Position: Sitting, Cuff Size: Normal)   Pulse 74   Resp 14   Ht _0  (1.549 m)   Wt 125 lb (56.7 kg)   BMI 23.62 kg/m    Physical Exam Vitals and nursing note reviewed.  Constitutional:      Appearance: She is well-developed.  HENT:     Head: Normocephalic and atraumatic.  Eyes:     Conjunctiva/sclera: Conjunctivae normal.  Cardiovascular:     Rate and Rhythm: Normal rate and regular rhythm.  Heart sounds: Normal heart sounds.  Pulmonary:     Effort: Pulmonary effort is normal.     Breath sounds: Normal breath sounds.  Abdominal:     General: Bowel sounds are normal.     Palpations: Abdomen is soft.  Musculoskeletal:     Cervical back: Normal range of motion.  Lymphadenopathy:     Cervical: No cervical adenopathy.  Skin:    General: Skin is warm and dry.     Capillary Refill: Capillary refill takes less than 2 seconds.  Neurological:     Mental Status: She is alert and oriented to person, place, and time.  Psychiatric:        Behavior: Behavior normal.      Musculoskeletal Exam: She had limited range of motion of her cervical spine.  She has thoracic kyphosis.  She had discomfort range of motion of her lumbar spine.  She had tenderness in the lower lumbar region and SI joint area.  Shoulder joints were in good range of motion.  Elbow joints with good range of motion.  She has mild DIP and PIP thickening but no synovitis.  She had discomfort range of  motion of her left hip joint which is replaced.  Knee joints with good range of motion with discomfort.  She has bilateral pedal edema and tenderness over ankles.  No synovitis was noted.  No MTP or PIP swelling was noted.  CDAI Exam: CDAI Score: -- Patient Global: --; Provider Global: -- Swollen: --; Tender: -- Joint Exam 08/26/2019   No joint exam has been documented for this visit   There is currently no information documented on the homunculus. Go to the Rheumatology activity and complete the homunculus joint exam.  Investigation: No additional findings.  Imaging: XR KNEE 3 VIEW LEFT  Result Date: 08/26/2019 Moderate medial compartment narrowing and moderate chondromalacia patella was noted.  Chondrocalcinosis was noted.  Flabella was noted. Impression: These findings are consistent with moderate osteoarthritis, moderate chondromalacia patella and chondrocalcinosis.  XR KNEE 3 VIEW RIGHT  Result Date: 08/26/2019 Moderate medial compartment narrowing was noted.  Mild patellofemoral narrowing was noted.  Chondrocalcinosis was noted. Impression: These findings are consistent with moderate osteoarthritis, mild chondromalacia patella and chondrocalcinosis.  XR Lumbar Spine 2-3 Views  Result Date: 08/26/2019 Multilevel spondylosis with lateral osteophytes are noted at multiple levels.  L3, L4, and possibly L5 compression fractures were noted.  Facet joint arthropathy was noted. Impression: These findings are consistent with multilevel spondylosis, facet joint arthropathy and compression fractures.  These compression fractures are old when compared to the previous x-rays and MRI report.   Recent Labs: Lab Results  Component Value Date   WBC 8.3 08/20/2019   HGB 9.7 (L) 08/20/2019   PLT 256 08/20/2019   NA 138 07/30/2019   K 4.3 07/30/2019   CL 99 07/30/2019   CO2 32 07/30/2019   GLUCOSE 175 (H) 07/30/2019   BUN 33 (H) 07/30/2019   CREATININE 1.74 (H) 07/30/2019   BILITOT 0.3  04/29/2019   ALKPHOS 68 04/29/2019   AST 17 04/29/2019   ALT 10 04/29/2019   PROT 6.4 04/29/2019   ALBUMIN 4.0 04/29/2019   CALCIUM 9.8 07/30/2019   GFRAA >60 03/04/2016   Chart review: August 27, 2002 ESR 30, CK 26, TSH normal, ANA negative, SPEP nonspecific, ACE 24, HLA-B27 negative  Speciality Comments: No specialty comments available.  Procedures:  No procedures performed Allergies: Atorvastatin and Ezetimibe   Assessment / Plan:     Visit Diagnoses:  Primary osteoarthritis of both hands-she has mild osteoarthritis in her hands.  No synovitis was noted.  She had surgery on her right third flexor tendon in the past by Dr. Amedeo Plenty.  Primary osteoarthritis of both knees -she complains of increased pain and discomfort in her bilateral knee joints and difficulty walking.  Plan: XR KNEE 3 VIEW RIGHT, XR KNEE 3 VIEW LEFT.  X-ray of bilateral knee joint showed moderate osteoarthritis, moderate chondromalacia patella and chondrocalcinosis.   Chondrocalcinosis- she would benefit from colchicine.  Although she is on statins as well.  She might be able to tolerate colchicine 0.6 mg half a tablet p.o. daily.  We will call in the prescription.  Side effects were discussed.  I will obtain additional labs today.  Displaced fracture of left femoral neck (HCC) - Status post left hip arthroplasty.  She continues to have left hip joint discomfort.  DDD (degenerative disc disease), lumbar - She had MRI in June 2020 by Dr. Sherwood Gambler which showed mild degenerative changes and L3-L4 vertebral fractures. -Patient states she has been experiencing a lot of lower back discomfort to the point she is having difficulty sleeping.  She takes tramadol which is not relieving her discomfort.  Plan: XR Lumbar Spine 2-3 Views.  X-ray showed multilevel spondylosis, facet joint arthropathy and no new compression fractures.  Psoriasis - Diagnosed in her 59s per patient.  She has few scattered lesions on her lower extremities for  which she uses topical agent.  Age-related osteoporosis without current pathological fracture - DEXA Sendil done by Dr. Collene Mares in April 30, 2016 showed a T score of -2.6, BMD 0.680 in the right femoral neck.  She is on Boniva.  She will need a repeat bone density.  If her bone density has not improved she may need to more aggressive therapy  Forteo or Tymlos.  She has had vertebral fractures.  Essential hypertension-blood pressure is low.  History of diabetes mellitus - She is on insulin.  Patient states her diabetes is not very well controlled.  Other diabetic neurological complication associated with type 2 diabetes mellitus ([place]) - Treated with Neurontin.  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 XR KNEE 3 VIEW RIGHT  . XR KNEE 3 VIEW LEFT  . XR Lumbar Spine 2-3 Views  . Sedimentation rate  . Rheumatoid factor  . Cyclic citrul peptide antibody, IgG  . Uric acid  . Iron, TIBC and Ferritin Panel  . Magnesium   Meds ordered this encounter  Medications  . colchicine 0.6 MG tablet    Sig: Take 0.5 tablets (0.3 mg total) by mouth daily.    Dispense:  15 tablet    Refill:  2    Face-to-face time spent with patient was 45 minutes. Greater than 50% of time was spent in counseling and coordination of care.  Follow-Up Instructions: Return in 4 weeks (on 09/23/2019) for Osteoarthritis, CPPD.   Bo Merino, MD  Note - This record has been created using Editor, commissioning.  Chart creation errors have been sought, but may not always  have been located. Such creation errors do not reflect on  the standard of medical care.

## 2019-08-26 NOTE — Telephone Encounter (Signed)
Patient called stating her reading after dinner last night was 191 - this morning it was 188 before breakfast. Patient states Dr asked her call back with this information.

## 2019-08-26 NOTE — Telephone Encounter (Signed)
Received fax from [facility] stating that the pt has been approved for Novolog Flexpen U100. This authorization will end on 08/25/2020. Called pt and made her aware of this. She verbalized understanding and new Rx was sent to [facility] per pt's request.

## 2019-08-26 NOTE — Telephone Encounter (Signed)
Per instructions of Dr. Dwyane Dee, PA has been initiated via [email or web address].  Abigail Hoover (Key: [id number])  Your information has been submitted to [place]. [employer] [place] will review the request and notify you of the determination decision directly, typically within 3 business days of your submission and once all necessary information is received.  You will also receive your request decision electronically. To check for an update later, open the request again from your dashboard.  If [employer] [place] has not responded within the specified timeframe or if you have any questions about your PA submission, contact [place] directly at [facility]) [phone] or ([place]) [phone].  Abigail Hoover (Key: [id number]) NovoLOG FlexPen 100UNIT/ML pen-injectors   Form [employer] [place] Medicare Part D General Authorization Form Created 4 minutes ago Sent to Plan 2 minutes ago Plan Response 2 minutes ago Submit Clinical Questions less than a minute ago Determination Wait for Determination Please wait for [employer]BS [place] [facility] to return a determination.

## 2019-08-26 NOTE — Telephone Encounter (Signed)
Please see pt's note about her blood sugars this morning.

## 2019-08-26 NOTE — Progress Notes (Signed)
Subjective: Marsha Gundlach presents today at risk foot care. Pt has h/o NIDDM with chronic kidney disease and painful mycotic nails b/l that are difficult to trim. Pain interferes with ambulation. Aggravating factors include wearing enclosed shoe gear. Pain is relieved with periodic professional debridement.  She notes no new pedal concerns on today's visit.  Past Medical History:  Diagnosis Date  . 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 Anxiety   . Arthritis   . Bladder irritation   . Diabetes mellitus   . GERD (gastroesophageal reflux disease)   . Hypercholesterolemia   . Hypertension   . Hypothyroidism   . Iron deficiency anemia, unspecified   . Psoriasis   . Transfusion history 03/05/2016   for postoperative (ORIF) anemia superimposed on chronic anemia     Current Outpatient Medications on File Prior to Visit  Medication Sig Dispense Refill  . aspirin EC 81 MG tablet Take 81 mg by mouth daily. Take 1 tablet by mouth once daily.    Marland Kitchen CARAFATE 1 GM/10ML suspension TAKE 10ML BY MOUTH 4 (FOUR) TIMES A DAY BETWEEN MEALS AND AT BEDTIME    . DEXILANT 60 MG capsule Take 60 mg by mouth daily. Take 1 tablet by mouth once daily.    . furosemide (LASIX) 40 MG tablet Take 40 mg by mouth. Take 1 tablet by mouth three times per week.    . gabapentin (NEURONTIN) 300 MG capsule Take by mouth.    Marland Kitchen glucose blood (ONETOUCH VERIO) test strip Use Onetouch verio test strips to check blood sugar three times daily. (90 day RX) 300 each 8  . hydrocortisone 2.5 % ointment APPLY TO AFFECED ARES(S) TOPICALLY TWICE DAILY AS NEEDED  3  . ibandronate (BONIVA) 150 MG tablet Take 150 mg by mouth every 30 (thirty) days. Take 1 tablet by mouth once per month.    . insulin lispro (HUMALOG KWIKPEN) 100 UNIT/ML KwikPen Inject 12 units under skin before breakfast, 4-5 units before lunch, 12 units before dinner. 3 mL 2  . Insulin Pen Needle 31G X 5 MM MISC Use with Novolog 100 each 1  . Insulin Syringe-Needle  U-100 25G X 5/8" 1 ML MISC USE DAILY TO INJECT INSULIN 100 each 3  . isosorbide mononitrate (IMDUR) 120 MG 24 hr tablet Take 120 mg by mouth daily. Take 1 tablet by mouth once daily.    Marland Kitchen JANUVIA 25 MG tablet TAKE 1 TABLET (25 MG TOTAL) BY MOUTH DAILY. 30 tablet 2  . Lancet Devices (ONE TOUCH DELICA LANCING DEV) MISC 1 each by Does not apply route daily. Use as instructed to check blood sugar once daily. 1 each 0  . Lancets (ONETOUCH DELICA PLUS [id number]) MISC TEST BLOOD SUGAR ONCE DAILY 100 each 2  . metoprolol succinate (TOPROL-XL) 50 MG 24 hr tablet Take 1 tablet (50 mg total) by mouth daily. Take with or immediately following a meal. 30 tablet 3  . mirabegron ER (MYRBETRIQ) 50 MG TB24 tablet Take by mouth.    . nitroGLYCERIN (NITROSTAT) 0.4 MG SL tablet Place 1 tablet (0.4 mg total) under the tongue every 5 (five) minutes x 3 doses as needed for chest pain. 25 tablet 12  . NOVOLOG FLEXPEN 100 UNIT/ML FlexPen INJECT 12 UNITS UNDER THE SKIN BEFORE BREAKFAST, 4 TO 5 UNITS BEFORE LUNCH, AND 12 UNITS BEFORE DINNER. 15 mL 2  . pentosan polysulfate (ELMIRON) 100 MG capsule TAKE 1 CAPSULE (100 MG TOTAL) BY MOUTH 3 TIMES DAILY BEFORE MEALS.    Marland Kitchen  pravastatin (PRAVACHOL) 20 MG tablet Take 1 tablet (20 mg total) by mouth daily. 90 tablet 1  . repaglinide (PRANDIN) 1 MG tablet Take 1 mg by mouth 2 (two) times daily.    Marland Kitchen SYNTHROID 75 MCG tablet TAKE 1 TABLET EVERY DAY 90 tablet 0  . triamcinolone ointment (KENALOG) 0.1 % APPLY THIN LAYER ON THE SKIN TWICE A DAY FOR 3 WEEKS  0   No current facility-administered medications on file prior to visit.     Allergies  Allergen Reactions  . Atorvastatin Rash  . Ezetimibe Itching    Objective: Kendria Halberg is a pleasant 74 y.o. y.o. Patient Race: Asian [4]  female in NAD. AAO x 3.  Vitals:   08/18/19 1527  Temp: (!) 97.2 F (36.2 C)    Vascular Examination: Neurovascular status unchanged b/l lower extremities. Capillary refill time to digits  immediate b/l. Palpable DP pulses b/l. Palpable PT pulses b/l. Pedal hair sparse b/l. Skin temperature gradient within normal limits b/l. No edema noted b/l.  Dermatological Examination: Pedal skin with normal turgor, texture and tone bilaterally. No open wounds bilaterally. No interdigital macerations bilaterally. Toenails 1-5 b/l elongated, discolored, dystrophic, thickened, crumbly with subungual debris and tenderness to dorsal palpation.  Musculoskeletal: Normal muscle strength 5/5 to all lower extremity muscle groups bilaterally. No pain crepitus or joint limitation noted with ROM b/l. No gross bony deformities bilaterally. Patient ambulates independent of any assistive aids.  Neurological Examination: Protective sensation intact 5/5 intact bilaterally with 10g monofilament b/l. Vibratory sensation intact b/l. Proprioception intact bilaterally.  Assessment: 1. Pain due to onychomycosis of toenails of both feet   2. Type 2 diabetes mellitus with stage 3 chronic kidney disease, with long-term current use of insulin, unspecified whether stage 3a or 3b CKD ([place])    Plan: -Examined patient. -No new findings. No new orders. -Continue diabetic foot care principles. Literature dispensed on today.  -Toenails 1-5 b/l were debrided in length and girth with sterile nail nippers and dremel without iatrogenic bleeding.  -Patient to continue soft, supportive shoe gear daily. -Patient to report any pedal injuries to medical professional immediately. -Patient/POA to call should there be question/concern in the interim.  Return in about 3 months (around 11/18/2019).  Marzetta Board, DPM

## 2019-08-26 NOTE — Telephone Encounter (Signed)
Since her morning sugars are high she will need to start taking Lantus 6 units at dinnertime daily. This may help keep her sugars down the rest of the day also. However needs to be seen in follow-up in 3 weeks, same day labs

## 2019-08-27 ENCOUNTER — Other Ambulatory Visit: Payer: Self-pay | Admitting: *Deleted

## 2019-08-27 DIAGNOSIS — M47816 Spondylosis without myelopathy or radiculopathy, lumbar region: Secondary | ICD-10-CM

## 2019-08-27 DIAGNOSIS — M51369 Other intervertebral disc degeneration, lumbar region without mention of lumbar back pain or lower extremity pain: Secondary | ICD-10-CM

## 2019-08-27 DIAGNOSIS — M545 Low back pain, unspecified: Secondary | ICD-10-CM

## 2019-08-27 DIAGNOSIS — G8929 Other chronic pain: Secondary | ICD-10-CM

## 2019-08-27 DIAGNOSIS — M5136 Other intervertebral disc degeneration, lumbar region: Secondary | ICD-10-CM

## 2019-08-27 LAB — IRON,TIBC AND FERRITIN PANEL
%SAT: 13 % (calc) — ABNORMAL LOW (ref 16–45)
Ferritin: 284 ng/mL (ref 16–288)
Iron: 29 ug/dL — ABNORMAL LOW (ref 45–160)
TIBC: 225 mcg/dL (calc) — ABNORMAL LOW (ref 250–450)

## 2019-08-27 LAB — MAGNESIUM: Magnesium: 1.9 mg/dL (ref 1.5–2.5)

## 2019-08-27 LAB — URIC ACID: Uric Acid, Serum: 7.9 mg/dL — ABNORMAL HIGH (ref 2.5–7.0)

## 2019-08-27 LAB — CYCLIC CITRUL PEPTIDE ANTIBODY, IGG: Cyclic Citrullin Peptide Ab: <16 UNITS

## 2019-08-27 LAB — RHEUMATOID FACTOR: Rheumatoid fact SerPl-aCnc: <14 IU/mL (ref <14)

## 2019-08-27 LAB — SEDIMENTATION RATE

## 2019-08-29 ENCOUNTER — Other Ambulatory Visit: Payer: Self-pay | Admitting: Rheumatology

## 2019-08-29 ENCOUNTER — Telehealth: Payer: Self-pay | Admitting: Rheumatology

## 2019-08-29 DIAGNOSIS — M545 Low back pain, unspecified: Secondary | ICD-10-CM

## 2019-08-29 DIAGNOSIS — G8929 Other chronic pain: Secondary | ICD-10-CM

## 2019-08-29 NOTE — Telephone Encounter (Signed)
error 

## 2019-08-29 NOTE — Progress Notes (Signed)
Iron saturation was low, uric acid elevated.  I will discuss results at the follow-up.  Please forward labs to her PCP.

## 2019-08-29 NOTE — Telephone Encounter (Signed)
Patient called.  She is having severe lower back pain.  She states she is not having any relief from tramadol.  She states the pain is localized.  She had previous vertebral fractures.  Please schedule MRI lumbar spine to rule out vertebral fracture per vertebroplasty protocol.  Patient requests open MRI.  Please call in Valium 5 mg 1 tablet p.o. prior to MRI.  Please call in 1 tablet at her lower pharmacy.  Schedule MRI as soon as possible.  She also needs a repeat DEXA scan her last DEXA was in 2018. Thank you, Bo Merino, MD

## 2019-08-30 MED ORDER — DIAZEPAM 5 MG PO TABS: 5.0000 mg | ORAL_TABLET | Freq: Once | ORAL | 0 refills | Status: AC

## 2019-08-30 NOTE — Addendum Note (Signed)
Addended by: Carole Binning on: 08/30/2019 11:17 AM   Modules accepted: Orders

## 2019-08-30 NOTE — Addendum Note (Signed)
Addended by: Ofilia Neas on: 08/30/2019 12:06 PM   Modules accepted: Orders

## 2019-08-31 NOTE — Telephone Encounter (Signed)
LMOM for Dr. Collene Mares to order Dexa at [facility] due to conflict of interest.

## 2019-09-02 ENCOUNTER — Ambulatory Visit (HOSPITAL_COMMUNITY)
Admission: RE | Admit: 2019-09-02 | Discharge: 2019-09-02 | Disposition: A | Discharge status: Home / Self Care | Payer: Medicare Other | Source: Ambulatory Visit | Attending: Rheumatology | Admitting: Rheumatology

## 2019-09-02 ENCOUNTER — Other Ambulatory Visit: Payer: Self-pay

## 2019-09-02 DIAGNOSIS — G8929 Other chronic pain: Secondary | ICD-10-CM

## 2019-09-02 DIAGNOSIS — M545 Low back pain, unspecified: Secondary | ICD-10-CM

## 2019-09-03 ENCOUNTER — Other Ambulatory Visit: Payer: Self-pay | Admitting: Rheumatology

## 2019-09-03 ENCOUNTER — Other Ambulatory Visit: Payer: Self-pay | Admitting: *Deleted

## 2019-09-03 DIAGNOSIS — M8008XA Age-related osteoporosis with current pathological fracture, vertebra(e), initial encounter for fracture: Secondary | ICD-10-CM

## 2019-09-03 NOTE — Progress Notes (Signed)
I called patient yesterday and notified her of the findings.  We will schedule for vertebroplasty/kyphoplasty.

## 2019-09-04 ENCOUNTER — Other Ambulatory Visit: Payer: Self-pay | Admitting: Endocrinology

## 2019-09-06 ENCOUNTER — Other Ambulatory Visit: Payer: Self-pay | Admitting: Endocrinology

## 2019-09-07 ENCOUNTER — Other Ambulatory Visit: Payer: Self-pay

## 2019-09-07 ENCOUNTER — Telehealth: Payer: Self-pay | Admitting: Endocrinology

## 2019-09-07 MED ORDER — UNITHROID 75 MCG PO TABS: 75.0000 ug | ORAL_TABLET | Freq: Every day | ORAL | 2 refills | Status: DC

## 2019-09-07 NOTE — Telephone Encounter (Signed)
Patient requests Olen Cordial to give her a call regarding the synthroid request. [phone]

## 2019-09-07 NOTE — Telephone Encounter (Signed)
She will switch to Unithroid brand which is as good as Synthroid

## 2019-09-07 NOTE — Telephone Encounter (Signed)
Patient called back and she stated that she called [employer], and they stated that they would pay for Synthroid. (it does require a PA).  Patient became upset and stated that she wanted name brand Synthroid. Pt was informed that I would speak with the doctor.

## 2019-09-07 NOTE — Telephone Encounter (Signed)
Please complete a PA.  Reason for brand-name Synthroid is patient preference

## 2019-09-07 NOTE — Telephone Encounter (Signed)
Patient called re: RX that was sent for Synthroid on 09/06/19. PHARM told patient that her insurance will not cover Synthroid and told patient to request a RX for generic Synthroid, however Patient does not want to change to generic Synthroid. Patient states she has been taking Synthroid for many years. Patient requests to be called at ph# [phone] regarding the above.

## 2019-09-07 NOTE — Telephone Encounter (Signed)
Called pt and gave her MD message. Pt verbalized understanding and new Rx for Unithroid DAW was sent.

## 2019-09-08 ENCOUNTER — Ambulatory Visit
Admission: RE | Admit: 2019-09-08 | Discharge: 2019-09-08 | Disposition: A | Discharge status: Home / Self Care | Payer: Medicare Other | Source: Ambulatory Visit | Attending: Rheumatology | Admitting: Rheumatology

## 2019-09-08 ENCOUNTER — Encounter: Payer: Self-pay | Admitting: *Deleted

## 2019-09-08 ENCOUNTER — Other Ambulatory Visit (HOSPITAL_COMMUNITY): Payer: Self-pay | Admitting: Interventional Radiology

## 2019-09-08 ENCOUNTER — Other Ambulatory Visit: Payer: Self-pay

## 2019-09-08 ENCOUNTER — Other Ambulatory Visit: Payer: Self-pay | Admitting: Student

## 2019-09-08 DIAGNOSIS — S32040A Wedge compression fracture of fourth lumbar vertebra, initial encounter for closed fracture: Secondary | ICD-10-CM | POA: Diagnosis not present

## 2019-09-08 DIAGNOSIS — S32050A Wedge compression fracture of fifth lumbar vertebra, initial encounter for closed fracture: Secondary | ICD-10-CM | POA: Diagnosis not present

## 2019-09-08 DIAGNOSIS — M8008XA Age-related osteoporosis with current pathological fracture, vertebra(e), initial encounter for fracture: Secondary | ICD-10-CM

## 2019-09-08 DIAGNOSIS — S32000S Wedge compression fracture of unspecified lumbar vertebra, sequela: Secondary | ICD-10-CM

## 2019-09-08 HISTORY — PX: IR RADIOLOGIST EVAL & MGMT: IMG5224

## 2019-09-08 MED ORDER — SYNTHROID 25 MCG PO TABS: 25.0000 ug | ORAL_TABLET | Freq: Every day | ORAL | 1 refills | Status: DC

## 2019-09-08 NOTE — Telephone Encounter (Signed)
Noted  

## 2019-09-08 NOTE — Telephone Encounter (Signed)
Rx for name brand Synthroid sent to pharmacy.

## 2019-09-08 NOTE — Telephone Encounter (Signed)
Rhea from [place] called to advise that PA has been approved - From 09/07/2019 to 09/06/2020  Clarification or additional information can be obtained by calling [phone]

## 2019-09-08 NOTE — Consult Note (Addendum)
Chief Complaint: Patient was seen in consultation today for painful lumbar compression fractures at the request of [place]  Referring Physician(s): [place].  PCP is [facility]  She also sees [facility].  History of Present Illness: Abigail Hoover is a 74 y.o. female who describes about 2 months of bandlike lumbar pain radiating into her hips and legs bilaterally, not extending to feet or toes.  There is no inciting event, injury, or accident. She was prescribed pain medications NOS and use them for several days, 3 times daily, but felt they did not help with the symptoms so has not taken them for several days now. No clinical history of previous compression fractures per patient, although on review of imaging   Subacute L3 compression fracture was diagnosed on MR 01/04/2009.  No previous lumbar spine surgeries or injections per patient, although on review of previous imaging patient underwent epidural lumbosacral injections in September 2009.  She describes a left femur fracture, and is status post hip arthroplasty 03/02/2016.  She does have a diagnosis of osteopenia/osteoporosis.  Past Medical History:  Diagnosis Date  . 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 Anxiety   . Arthritis   . Bladder irritation   . Diabetes mellitus   . GERD (gastroesophageal reflux disease)   . Hypercholesterolemia   . Hypertension   . Hypothyroidism   . Iron deficiency anemia, unspecified   . Psoriasis   . Transfusion history 03/05/2016   for postoperative (ORIF) anemia superimposed on chronic anemia    Past Surgical History:  Procedure Laterality Date  . ABDOMINAL HYSTERECTOMY     TAH  . BREAST BIOPSY     Left breast biopsy benign lesion  . BREAST BIOPSY  03/11/2012   Procedure: BREAST BIOPSY;  Surgeon: Odis Hollingshead, MD;  Location: [facility];  Service: General;  Laterality: Left;  remove left breast mass  . BREAST EXCISIONAL BIOPSY Left   . CARDIAC CATHETERIZATION  N/A 01/02/2015   Procedure: Left Heart Cath and Coronary Angiography;  Surgeon: Charolette Forward, MD;  Location: [facility];  Service: Cardiovascular;  Laterality: N/A;  . CATARACT EXTRACTION    . CERVICAL DISC SURGERY    . COLONOSCOPY  2013   neg. next 2023.   Marland Kitchen EYE SURGERY    . IR RADIOLOGIST EVAL & MGMT  09/08/2019  . LEFT HIP SURGERY    . TOTAL HIP ARTHROPLASTY Left 03/02/2016   Procedure: TOTAL HIP ARTHROPLASTY ANTERIOR APPROACH;  Surgeon: Rod Can, MD;  Location: [facility];  Service: Orthopedics;  Laterality: Left;    Allergies: Atorvastatin and Ezetimibe  Medications: Prior to Admission medications   Medication Sig Start Date End Date Taking? Authorizing Provider  aspirin EC 81 MG tablet Take 81 mg by mouth daily. Take 1 tablet by mouth once daily.    [provider]  CARAFATE 1 GM/10ML suspension TAKE 10ML BY MOUTH 4 (FOUR) TIMES A DAY BETWEEN MEALS AND AT BEDTIME 09/22/18   [provider]  colchicine 0.6 MG tablet Take 0.5 tablets (0.3 mg total) by mouth daily. 08/26/19   Bo Merino, MD  DEXILANT 60 MG capsule Take 60 mg by mouth daily. Take 1 tablet by mouth once daily. 03/15/16   [provider]  furosemide (LASIX) 40 MG tablet Take 40 mg by mouth. Take 1 tablet by mouth three times per week.    [provider]  gabapentin (NEURONTIN) 300 MG capsule Take by mouth. 09/29/18  [provider]  glucose blood (ONETOUCH VERIO) test strip Use Onetouch verio test strips to check blood sugar three times daily. (90 day RX) 05/31/19   Elayne Snare, MD  hydrocortisone 2.5 % ointment APPLY TO AFFECED ARES(S) TOPICALLY TWICE DAILY AS NEEDED 03/31/17   [provider]  ibandronate (BONIVA) 150 MG tablet Take 150 mg by mouth every 30 (thirty) days. Take 1 tablet by mouth once per month. 02/19/16   [provider]  insulin aspart (NOVOLOG FLEXPEN) 100 UNIT/ML FlexPen INJECT 12 UNITS UNDER THE SKIN BEFORE BREAKFAST, 4 TO 5 UNITS  BEFORE LUNCH, AND 12 UNITS BEFORE DINNER. 08/26/19   Elayne Snare, MD  Insulin Pen Needle 31G X 5 MM MISC Use with Novolog 05/28/19   Elayne Snare, MD  Insulin Syringe-Needle U-100 25G X 5/8" 1 ML MISC USE DAILY TO INJECT INSULIN 05/26/18   Elayne Snare, MD  isosorbide mononitrate (IMDUR) 120 MG 24 hr tablet Take 120 mg by mouth daily. Take 1 tablet by mouth once daily.    [provider]  JANUVIA 25 MG tablet TAKE 1 TABLET (25 MG TOTAL) BY MOUTH DAILY. 09/06/19   Elayne Snare, MD  Lancet Devices (ONE TOUCH DELICA LANCING DEV) MISC 1 each by Does not apply route daily. Use as instructed to check blood sugar once daily. 08/06/19   Elayne Snare, MD  Lancets (ONETOUCH DELICA PLUS [id number]) [place] TEST BLOOD SUGAR ONCE DAILY 08/06/19   Elayne Snare, MD  metoprolol succinate (TOPROL-XL) 50 MG 24 hr tablet Take 1 tablet (50 mg total) by mouth daily. Take with or immediately following a meal. 01/04/15   Charolette Forward, MD  mirabegron ER (MYRBETRIQ) 50 MG TB24 tablet Take by mouth. 09/29/18   [provider]  nitroGLYCERIN (NITROSTAT) 0.4 MG SL tablet Place 1 tablet (0.4 mg total) under the tongue every 5 (five) minutes x 3 doses as needed for chest pain. 01/04/15   Charolette Forward, MD  pentosan polysulfate (ELMIRON) 100 MG capsule TAKE 1 CAPSULE (100 MG TOTAL) BY MOUTH 3 TIMES DAILY BEFORE MEALS. 09/29/18   [provider]  pravastatin (PRAVACHOL) 20 MG tablet Take 1 tablet (20 mg total) by mouth daily. 06/24/19   Elayne Snare, MD  repaglinide (PRANDIN) 1 MG tablet Take 1 mg by mouth 2 (two) times daily. 01/16/19   [provider]  SYNTHROID 25 MCG tablet Take 1 tablet (25 mcg total) by mouth daily before breakfast. 09/08/19   Elayne Snare, MD  traMADol (ULTRAM) 50 MG tablet Take 50 mg by mouth every 6 (six) hours. 08/25/19   [provider]  triamcinolone ointment (KENALOG) 0.1 % APPLY THIN LAYER ON THE SKIN TWICE A DAY FOR 3 WEEKS 03/21/17   [provider]     Family History    Problem Relation Age of Onset  . Heart disease Mother        heart attack  . Stroke Father        brain hemorrhage  . Diabetes Brother   . Breast cancer Sister   . Cancer Sister        breast cancer    Social History   Socioeconomic History  . Marital status: Married    Spouse name: Not on file  . Number of children: 1  . Years of education: college  . Highest education level: Not on file  Occupational History  . Occupation: Homemaker  Tobacco Use  . Smoking status: Never Smoker  . Smokeless tobacco: Never Used  Vaping Use  .  Vaping Use: Never used  Substance and Sexual Activity  . Alcohol use: No    Alcohol/week: 0.0 standard drinks  . Drug use: No  . Sexual activity: Not Currently  Other Topics Concern  . Not on file  Social History Narrative   Patient is married with one child.   Patient is right handed.   Patient has college education.   Patient drinks tea, two times a day   Social Determinants of Health   Financial Resource Strain:   . Difficulty of Paying Living Expenses:   Food Insecurity:   . Worried About Charity fundraiser in the Last Year:   . Arboriculturist in the Last Year:   Transportation Needs:   . Film/video editor (Medical):   Marland Kitchen Lack of Transportation (Non-Medical):   Physical Activity:   . Days of Exercise per Week:   . Minutes of Exercise per Session:   Stress:   . Feeling of Stress :   Social Connections:   . Frequency of Communication with Friends and Family:   . Frequency of Social Gatherings with Friends and Family:   . Attends Religious Services:   . Active Member of Clubs or Organizations:   . Attends Archivist Meetings:   Marland Kitchen Marital Status:     ECOG Status: 2 - Symptomatic, <50% confined to bed  Review of Systems: A 12 point ROS discussed and pertinent positives are indicated in the HPI above.  All other systems are negative.  Review of Systems  Vital Signs: There were no vitals taken for this  visit.  Physical Exam Constitutional: Oriented to person, place, and time. Well-developed and well-nourished. No distress.   HENT:  Head: Normocephalic and atraumatic.  Eyes: Conjunctivae and EOM are normal. Right eye exhibits no discharge. Left eye exhibits no discharge. No scleral icterus.  Neck: No JVD present.  Pulmonary/Chest: Thoracic kyphosis.  Effort normal. No stridor. No respiratory distress.  Abdomen: soft, non distended.  Tenderness lower lumbar spine midline. Neurological:  alert and oriented to person, place, and time.  She walks with a shuffling gait. Skin: Skin is warm and dry.  not diaphoretic.  Psychiatric:   normal mood and affect.   behavior is normal. Judgment and thought content normal.   Mallampati Score:     Imaging: MR LUMBAR SPINE WO CONTRAST  Result Date: 09/02/2019 CLINICAL DATA:  Low back pain. EXAM: MRI LUMBAR SPINE WITHOUT CONTRAST TECHNIQUE: Multiplanar, multisequence MR imaging of the lumbar spine was performed. No intravenous contrast was administered. COMPARISON:  Lumbar spine x-rays dated August 26, 2019. MRI lumbar spine dated September 22, 2018. FINDINGS: Segmentation:  Standard. Alignment:  Physiologic. Vertebrae: Acute to subacute L4 inferior endplate compression fracture with 35% height loss centrally. Subacute L5 superior endplate compression fracture with 30% height loss centrally. Subacute fracture of the right L5 pedicle (series 7, image 5). Chronic L3 and L4 superior endplate compression deformities. No evidence of discitis or focal bone lesion. Conus medullaris and cauda equina: Conus extends to the L1 level. Conus and cauda equina appear normal. Paraspinal and other soft tissues: Negative. Disc levels: T12-L1 to L3-L4: No significant disc bulge or herniation. No stenosis. L4-L5: No significant disc bulge or herniation. Trace retropulsion of the L4 inferior and L5 superior endplates. New mild bilateral neuroforaminal stenosis. No spinal canal stenosis.  L5-S1: Negative disc. Mild bilateral facet arthropathy. No stenosis. IMPRESSION: 1. Acute to subacute L4 inferior endplate and L5 superior endplate compression fractures 2. Subacute fracture  of the right L5 pedicle. These results will be called to the ordering clinician or representative by the Radiology Department at the imaging location. Electronically Signed   By: Titus Dubin M.D.   On: 09/02/2019 16:46   XR KNEE 3 VIEW LEFT  Result Date: 08/26/2019 Moderate medial compartment narrowing and moderate chondromalacia patella was noted.  Chondrocalcinosis was noted.  Flabella was noted. Impression: These findings are consistent with moderate osteoarthritis, moderate chondromalacia patella and chondrocalcinosis.  XR KNEE 3 VIEW RIGHT  Result Date: 08/26/2019 Moderate medial compartment narrowing was noted.  Mild patellofemoral narrowing was noted.  Chondrocalcinosis was noted. Impression: These findings are consistent with moderate osteoarthritis, mild chondromalacia patella and chondrocalcinosis.  XR Lumbar Spine 2-3 Views  Result Date: 08/26/2019 Multilevel spondylosis with lateral osteophytes are noted at multiple levels.  L3, L4, and possibly L5 compression fractures were noted.  Facet joint arthropathy was noted. Impression: These findings are consistent with multilevel spondylosis, facet joint arthropathy and compression fractures.  These compression fractures are old when compared to the previous x-rays and MRI report.  IR Radiologist Eval & Mgmt  Result Date: 09/08/2019 Please refer to notes tab for details about interventional procedure. (Op Note)   Labs:  CBC: Recent Labs    10/27/18 1406 01/05/19 1340 05/11/19 1128 08/20/19 1152  WBC 6.8 5.9 6.7 8.3  HGB 10.5* 10.1* 9.8* 9.7*  HCT 34.3* 33.5*  31.9* 32.0* 31.8*  PLT 129* 175 226 256    COAGS: No results for input(s): INR, APTT in the last 8760 hours.  BMP: Recent Labs    12/16/18 1523 01/27/19 1542 04/29/19 1451  07/30/19 1457  NA 137 139 138 138  K 3.7 4.3 4.1 4.3  CL 101 102 101 99  CO2 28 29 30  32  GLUCOSE 167* 148* 118* 175*  BUN 32* 26* 29* 33*  CALCIUM 9.8 9.8 9.5 9.8  CREATININE 2.22* 1.64* 1.66* 1.74*    LIVER FUNCTION TESTS: Recent Labs    10/30/18 1527 01/27/19 1542 04/29/19 1451  BILITOT 0.3 0.4 0.3  AST 15 24 17   ALT 12 20 10   ALKPHOS 75 85 68  PROT 6.0 6.7 6.4  ALBUMIN 4.0 4.4 4.0    TUMOR MARKERS: No results for input(s): AFPTM, CEA, CA199, CHROMGRNA in the last 8760 hours.  Assessment and Plan: My impression is that this patient has subacute L4 and L5 lumbar compression fracture deformities which likely contribute or account for most of the low back pain.  Based on cross-sectional imaging, this would be anatomically approachable for percutaneous intervention.  No severe spinal stenosis or other contraindications.  No suggestion of metastatic disease or other pathologic findings to indicate a need for concomitant core biopsy. Given the  lack of adequate symptom relief with time and a pain medication regimen, and   limitations of activities of daily living, the patient  is clinically an appropriate candidate for consideration of vertebral augmentation. I discussed with the patient  the pathophysiology of vertebral compression fracture deformities; the stable nature of these which does not require emergent treatment; natural history which includes healing over some unpredictable number of months.  We discussed treatment options including watchful waiting, surgical fixation, and percutaneous kyphoplasty/vertebroplasty.  We discussed in detail the percutaneous kyphoplasty technique, anticipated benefits, time course to symptom resolution, possible risks and side effects.  We discussed his elevated risk of additional level fractures with or without vertebral augmentation.  We discussed the long-term need for continued bone building therapy managed by the patient's  PCP.   She seemed  to understand, and did ask appropriate questions. The patient is motivated to proceed with treatment ASAP.  Accordingly, we schedule percutaneous lumbar L4 and L5 kyphoplasty under moderate sedation at [facility] or [facility] as an outpatient ASAP, pending carrier approval if needed.    Thank you for this interesting consult.  I greatly enjoyed meeting Abigail Hoover and look forward to participating in their care.  A copy of this report was sent to the requesting provider on this date.  Electronically Signed: Rickard Rhymes 09/08/2019, 4:25 PM   I spent a total of  40 Minutes   in face to face in clinical consultation, greater than 50% of which was counseling/coordinating care for painful subacute lumbar L4 and L5 compression fractures.

## 2019-09-08 NOTE — Telephone Encounter (Signed)
PA will be completed.

## 2019-09-08 NOTE — Telephone Encounter (Signed)
PA has been initiated via [email or web address] for name brand Synthroid.  Abigail Hoover Key: [id number] - Rx #: [id number] help? Call us at [phone] Status Additional Information Required Drug Synthroid 75MCG tablets Form [employer] [place] [employer] Part D General Authorization Form Original Claim Info [id number]  our information has been submitted to [place]. [employer] [place] will review the request and notify you of the determination decision directly, typically within 3 business days of your submission and once all necessary information is received.  You will also receive your request decision electronically. To check for an update later, open the request again from your dashboard.  If [employer] has not responded within the specified timeframe or if you have any questions about your PA submission, contact [place]  directly at [facility]) [phone] or ([place]) [phone].

## 2019-09-09 ENCOUNTER — Ambulatory Visit (HOSPITAL_COMMUNITY)
Admission: RE | Admit: 2019-09-09 | Discharge: 2019-09-09 | Disposition: A | Discharge status: Home / Self Care | Payer: Medicare Other | Source: Ambulatory Visit | Attending: Interventional Radiology | Admitting: Interventional Radiology

## 2019-09-09 ENCOUNTER — Other Ambulatory Visit (HOSPITAL_COMMUNITY): Payer: Self-pay | Admitting: Interventional Radiology

## 2019-09-09 ENCOUNTER — Other Ambulatory Visit (HOSPITAL_COMMUNITY): Payer: Self-pay | Admitting: Physician Assistant

## 2019-09-09 ENCOUNTER — Other Ambulatory Visit: Payer: Self-pay

## 2019-09-09 ENCOUNTER — Encounter (HOSPITAL_COMMUNITY): Payer: Self-pay

## 2019-09-09 DIAGNOSIS — S32000S Wedge compression fracture of unspecified lumbar vertebra, sequela: Secondary | ICD-10-CM

## 2019-09-09 DIAGNOSIS — Z7982 Long term (current) use of aspirin: Secondary | ICD-10-CM | POA: Diagnosis not present

## 2019-09-09 DIAGNOSIS — Z794 Long term (current) use of insulin: Secondary | ICD-10-CM | POA: Insufficient documentation

## 2019-09-09 DIAGNOSIS — E039 Hypothyroidism, unspecified: Secondary | ICD-10-CM | POA: Diagnosis not present

## 2019-09-09 DIAGNOSIS — D509 Iron deficiency anemia, unspecified: Secondary | ICD-10-CM | POA: Insufficient documentation

## 2019-09-09 DIAGNOSIS — Z79890 Hormone replacement therapy (postmenopausal): Secondary | ICD-10-CM | POA: Diagnosis not present

## 2019-09-09 DIAGNOSIS — F419 Anxiety disorder, unspecified: Secondary | ICD-10-CM | POA: Diagnosis not present

## 2019-09-09 DIAGNOSIS — S32040A Wedge compression fracture of fourth lumbar vertebra, initial encounter for closed fracture: Secondary | ICD-10-CM | POA: Diagnosis not present

## 2019-09-09 DIAGNOSIS — E119 Type 2 diabetes mellitus without complications: Secondary | ICD-10-CM | POA: Diagnosis not present

## 2019-09-09 DIAGNOSIS — K219 Gastro-esophageal reflux disease without esophagitis: Secondary | ICD-10-CM | POA: Insufficient documentation

## 2019-09-09 DIAGNOSIS — M4856XA Collapsed vertebra, not elsewhere classified, lumbar region, initial encounter for fracture: Secondary | ICD-10-CM | POA: Diagnosis not present

## 2019-09-09 DIAGNOSIS — Z79899 Other long term (current) drug therapy: Secondary | ICD-10-CM | POA: Diagnosis not present

## 2019-09-09 DIAGNOSIS — S32050A Wedge compression fracture of fifth lumbar vertebra, initial encounter for closed fracture: Secondary | ICD-10-CM | POA: Diagnosis not present

## 2019-09-09 DIAGNOSIS — X58XXXS Exposure to other specified factors, sequela: Secondary | ICD-10-CM | POA: Insufficient documentation

## 2019-09-09 DIAGNOSIS — I10 Essential (primary) hypertension: Secondary | ICD-10-CM | POA: Diagnosis not present

## 2019-09-09 HISTORY — PX: IR KYPHO EA ADDL LEVEL THORACIC OR LUMBAR: IMG5520

## 2019-09-09 HISTORY — PX: IR KYPHO LUMBAR INC FX REDUCE BONE BX UNI/BIL CANNULATION INC/IMAGING: IMG5519

## 2019-09-09 LAB — GLUCOSE, CAPILLARY: Glucose-Capillary: 129 mg/dL — ABNORMAL HIGH (ref 70–99)

## 2019-09-09 LAB — CBC
HCT: 35.8 % — ABNORMAL LOW (ref 36.0–46.0)
Hemoglobin: 10.6 g/dL — ABNORMAL LOW (ref 12.0–15.0)
MCH: 25.2 pg — ABNORMAL LOW (ref 26.0–34.0)
MCHC: 29.6 g/dL — ABNORMAL LOW (ref 30.0–36.0)
MCV: 85 fL (ref 80.0–100.0)
Platelets: 242 10*3/uL (ref 150–400)
RBC: 4.21 MIL/uL (ref 3.87–5.11)
RDW: 15.8 % — ABNORMAL HIGH (ref 11.5–15.5)
WBC: 4.8 10*3/uL (ref 4.0–10.5)
nRBC: 0 % (ref 0.0–0.2)

## 2019-09-09 LAB — PROTIME-INR
INR: 1.1 (ref 0.8–1.2)
Prothrombin Time: 13.3 seconds (ref 11.4–15.2)

## 2019-09-09 MED ORDER — CEFAZOLIN SODIUM-DEXTROSE 2-4 GM/100ML-% IV SOLN
INTRAVENOUS | Status: AC
  Administered 2019-09-09: 2 g via INTRAVENOUS
  Filled 2019-09-09: qty 100

## 2019-09-09 MED ORDER — HYDROCODONE-ACETAMINOPHEN 5-325 MG PO TABS: 1.0000 | ORAL_TABLET | Freq: Four times a day (QID) | ORAL | 0 refills | Status: DC | PRN

## 2019-09-09 MED ORDER — FENTANYL CITRATE (PF) 100 MCG/2ML IJ SOLN
INTRAMUSCULAR | Status: AC
  Filled 2019-09-09: qty 2

## 2019-09-09 MED ORDER — OXYCODONE-ACETAMINOPHEN 5-325 MG PO TABS
ORAL_TABLET | ORAL | Status: AC
  Administered 2019-09-09: 1 via ORAL
  Filled 2019-09-09: qty 1

## 2019-09-09 MED ORDER — LIDOCAINE HCL 1 % IJ SOLN
INTRAMUSCULAR | Status: AC
  Filled 2019-09-09: qty 20

## 2019-09-09 MED ORDER — IOHEXOL 300 MG/ML  SOLN
50.0000 mL | Freq: Once | INTRAMUSCULAR | Status: AC | PRN
  Administered 2019-09-09: 5 mL

## 2019-09-09 MED ORDER — MIDAZOLAM HCL 2 MG/2ML IJ SOLN
INTRAMUSCULAR | Status: AC | PRN
  Administered 2019-09-09: 1 mg via INTRAVENOUS

## 2019-09-09 MED ORDER — SODIUM CHLORIDE 0.9 % IV SOLN: INTRAVENOUS | Status: DC

## 2019-09-09 MED ORDER — LIDOCAINE HCL 1 % IJ SOLN
INTRAMUSCULAR | Status: AC | PRN
  Administered 2019-09-09: 10 mL

## 2019-09-09 MED ORDER — FENTANYL CITRATE (PF) 100 MCG/2ML IJ SOLN
INTRAMUSCULAR | Status: AC | PRN
  Administered 2019-09-09: 25 ug via INTRAVENOUS

## 2019-09-09 MED ORDER — OXYCODONE-ACETAMINOPHEN 5-325 MG PO TABS: 1.0000 | ORAL_TABLET | Freq: Once | ORAL | Status: AC

## 2019-09-09 MED ORDER — MIDAZOLAM HCL 2 MG/2ML IJ SOLN
INTRAMUSCULAR | Status: AC
  Filled 2019-09-09: qty 2

## 2019-09-09 MED ORDER — CEFAZOLIN SODIUM-DEXTROSE 2-4 GM/100ML-% IV SOLN: 2.0000 g | Freq: Once | INTRAVENOUS | Status: AC

## 2019-09-09 NOTE — Procedures (Signed)
  Procedure: Lumbar L4 and L5 kyphoplasty   EBL:   minimal Complications:  none immediate  See full dictation in [employer].  Dillard Cannon MD Main # [phone] Pager  [phone]

## 2019-09-09 NOTE — Discharge Instructions (Signed)
KYPHOPLASTY/VERTEBROPLASTY DISCHARGE INSTRUCTIONS  Medications: (check all that apply)     Resume all home medications as before procedure.       Resume your aspirin tomorrow                Continue your pain medications as prescribed as needed.  Over the next 3-5 days, decrease your pain medication as tolerated.  Over the counter medications (i.e. Tylenol, ibuprofen, and aleve) may be substituted once severe/moderate pain symptoms have subsided.   Wound Care: - Bandages may be removed the day following your procedure.  You may get your incision wet once bandages are removed.  Bandaids may be used to cover the incisions until scab formation.  Topical ointments are optional.  - If you develop a fever greater than 101 degrees, have increased skin redness at the incision sites or pus-like oozing from incisions occurring within 1 week of the procedure, contact radiology at [phone] or [phone].  - Ice pack to back for 15-20 minutes 2-3 time per day for first 2-3 days post procedure.  The ice will expedite muscle healing and help with the pain from the incisions.   Activity: - Bedrest today with limited activity for 24 hours post procedure.  - No driving for 48 hours.  - Increase your activity as tolerated after bedrest (with assistance if necessary).  - Refrain from any strenuous activity or heavy lifting (greater than 10 lbs.).   Follow up: - Contact radiology at [phone] or [phone] if any questions/concerns.  - A physician assistant from radiology will contact you in approximately 1 week.  - If a biopsy was performed at the time of your procedure, your referring physician should receive the results in usually 2-3 days.

## 2019-09-09 NOTE — H&P (Signed)
Chief Complaint: Back  pain  Referring Physician(s): Deveshwar,Shaili.  PCP is [facility]  She also sees [employer].  Supervising Physician: Arne Cleveland  Patient Status: [facility] - Out-pt  History of Present Illness: Abigail Hoover is a 74 y.o. female who was seen by Dr. Vernard Gambles yesterday for severe back pain and consideration for vertebral augmentation.  MRI done 09/02/19 showed = 1. Acute to subacute L4 inferior endplate and L5 superior endplate compression fractures 2. Subacute fracture of the right L5 pedicle.  Images were reviewed by Dr.Hassell and he feels she is a good candidate for percutaneous intervention.  She is NPO. No nausea/vomiting. No Fever/chills. ROS negative.  Past Medical History:  Diagnosis Date  . 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 Anxiety   . Arthritis   . Bladder irritation   . Diabetes mellitus   . GERD (gastroesophageal reflux disease)   . Hypercholesterolemia   . Hypertension   . Hypothyroidism   . Iron deficiency anemia, unspecified   . Psoriasis   . Transfusion history 03/05/2016   for postoperative (ORIF) anemia superimposed on chronic anemia    Past Surgical History:  Procedure Laterality Date  . ABDOMINAL HYSTERECTOMY     TAH  . BREAST BIOPSY     Left breast biopsy benign lesion  . BREAST BIOPSY  03/11/2012   Procedure: BREAST BIOPSY;  Surgeon: Odis Hollingshead, MD;  Location: [place];  Service: General;  Laterality: Left;  remove left breast mass  . BREAST EXCISIONAL BIOPSY Left   . CARDIAC CATHETERIZATION N/A 01/02/2015   Procedure: Left Heart Cath and Coronary Angiography;  Surgeon: Charolette Forward, MD;  Location: [place] [facility];  Service: Cardiovascular;  Laterality: N/A;  . CATARACT EXTRACTION    . CERVICAL DISC SURGERY    . COLONOSCOPY  2013   neg. next 2023.   Marland Kitchen EYE SURGERY    . IR RADIOLOGIST EVAL & MGMT  09/08/2019  . LEFT HIP SURGERY    . TOTAL HIP ARTHROPLASTY Left 03/02/2016   Procedure: TOTAL HIP  ARTHROPLASTY ANTERIOR APPROACH;  Surgeon: Rod Can, MD;  Location: [place];  Service: Orthopedics;  Laterality: Left;    Allergies: Atorvastatin and Ezetimibe  Medications: Prior to Admission medications   Medication Sig Start Date End Date Taking? Authorizing Provider  aspirin EC 81 MG tablet Take 81 mg by mouth daily. Take 1 tablet by mouth once daily.   Yes [provider]  CARAFATE 1 GM/10ML suspension TAKE 10ML BY MOUTH 4 (FOUR) TIMES A DAY BETWEEN MEALS AND AT BEDTIME 09/22/18  Yes [provider]  DEXILANT 60 MG capsule Take 60 mg by mouth daily. Take 1 tablet by mouth once daily. 03/15/16  Yes [provider]  furosemide (LASIX) 40 MG tablet Take 40 mg by mouth. Take 1 tablet by mouth three times per week.   Yes [provider]  gabapentin (NEURONTIN) 300 MG capsule Take by mouth. 09/29/18  Yes [provider]  ibandronate (BONIVA) 150 MG tablet Take 150 mg by mouth every 30 (thirty) days. Take 1 tablet by mouth once per month. 02/19/16  Yes [provider]  insulin aspart (NOVOLOG FLEXPEN) 100 UNIT/ML FlexPen INJECT 12 UNITS UNDER THE SKIN BEFORE BREAKFAST, 4 TO 5 UNITS BEFORE LUNCH, AND 12 UNITS BEFORE DINNER. 08/26/19  Yes Elayne Snare, MD  Insulin Pen Needle 31G X 5 MM MISC Use with Novolog 05/28/19  Yes Elayne Snare, MD  isosorbide mononitrate (IMDUR) 120 MG 24  hr tablet Take 120 mg by mouth daily. Take 1 tablet by mouth once daily.   Yes [provider]  metoprolol succinate (TOPROL-XL) 50 MG 24 hr tablet Take 1 tablet (50 mg total) by mouth daily. Take with or immediately following a meal. 01/04/15  Yes Charolette Forward, MD  mirabegron ER (MYRBETRIQ) 50 MG TB24 tablet Take by mouth. 09/29/18  Yes [provider]  pentosan polysulfate (ELMIRON) 100 MG capsule TAKE 1 CAPSULE (100 MG TOTAL) BY MOUTH 3 TIMES DAILY BEFORE MEALS. 09/29/18  Yes [provider]  pravastatin (PRAVACHOL) 20 MG tablet Take 1 tablet (20 mg  total) by mouth daily. 06/24/19  Yes Elayne Snare, MD  repaglinide (PRANDIN) 1 MG tablet Take 1 mg by mouth 2 (two) times daily. 01/16/19  Yes [provider]  SYNTHROID 25 MCG tablet Take 1 tablet (25 mcg total) by mouth daily before breakfast. 09/08/19  Yes Elayne Snare, MD  traMADol (ULTRAM) 50 MG tablet Take 50 mg by mouth every 6 (six) hours. 08/25/19  Yes [provider]  colchicine 0.6 MG tablet Take 0.5 tablets (0.3 mg total) by mouth daily. 08/26/19   Bo Merino, MD  glucose blood (ONETOUCH VERIO) test strip Use Onetouch verio test strips to check blood sugar three times daily. (90 day RX) 05/31/19   Elayne Snare, MD  hydrocortisone 2.5 % ointment APPLY TO AFFECED ARES(S) TOPICALLY TWICE DAILY AS NEEDED 03/31/17   [provider]  Insulin Syringe-Needle U-100 25G X 5/8" 1 ML MISC USE DAILY TO INJECT INSULIN 05/26/18   Elayne Snare, MD  JANUVIA 25 MG tablet TAKE 1 TABLET (25 MG TOTAL) BY MOUTH DAILY. 09/06/19   Elayne Snare, MD  Lancet Devices (ONE TOUCH DELICA LANCING DEV) MISC 1 each by Does not apply route daily. Use as instructed to check blood sugar once daily. 08/06/19   Elayne Snare, MD  Lancets (ONETOUCH DELICA PLUS [id number]) [place] TEST BLOOD SUGAR ONCE DAILY 08/06/19   Elayne Snare, MD  nitroGLYCERIN (NITROSTAT) 0.4 MG SL tablet Place 1 tablet (0.4 mg total) under the tongue every 5 (five) minutes x 3 doses as needed for chest pain. 01/04/15   Charolette Forward, MD  triamcinolone ointment (KENALOG) 0.1 % APPLY THIN LAYER ON THE SKIN TWICE A DAY FOR 3 WEEKS 03/21/17   [provider]     Family History  Problem Relation Age of Onset  . Heart disease Mother        heart attack  . Stroke Father        brain hemorrhage  . Diabetes Brother   . Breast cancer Sister   . Cancer Sister        breast cancer    Social History   Socioeconomic History  . Marital status: Married    Spouse name: Not on file  . Number of children: 1  . Years of education: college    . Highest education level: Not on file  Occupational History  . Occupation: Homemaker  Tobacco Use  . Smoking status: Never Smoker  . Smokeless tobacco: Never Used  Vaping Use  . Vaping Use: Never used  Substance and Sexual Activity  . Alcohol use: No    Alcohol/week: 0.0 standard drinks  . Drug use: No  . Sexual activity: Not Currently  Other Topics Concern  . Not on file  Social History Narrative   Patient is married with one child.   Patient is right handed.   Patient has college education.   Patient  drinks tea, two times a day   Social Determinants of Health   Financial Resource Strain:   . Difficulty of Paying Living Expenses:   Food Insecurity:   . Worried About Charity fundraiser in the Last Year:   . Arboriculturist in the Last Year:   Transportation Needs:   . Film/video editor (Medical):   Marland Kitchen Lack of Transportation (Non-Medical):   Physical Activity:   . Days of Exercise per Week:   . Minutes of Exercise per Session:   Stress:   . Feeling of Stress :   Social Connections:   . Frequency of Communication with Friends and Family:   . Frequency of Social Gatherings with Friends and Family:   . Attends Religious Services:   . Active Member of Clubs or Organizations:   . Attends Archivist Meetings:   Marland Kitchen Marital Status:      Review of Systems: A 12 point ROS discussed and pertinent positives are indicated in the HPI above.  All other systems are negative.  Review of Systems  Vital Signs: Ht 5\' 1"  (1.549 m)   Wt 56.7 kg   BMI 23.62 kg/m   Physical Exam Vitals reviewed.  Constitutional:      Appearance: Normal appearance.  HENT:     Head: Normocephalic and atraumatic.  Eyes:     Extraocular Movements: Extraocular movements intact.  Cardiovascular:     Rate and Rhythm: Normal rate and regular rhythm.  Pulmonary:     Effort: Pulmonary effort is normal. No respiratory distress.     Breath sounds: Normal breath sounds.  Abdominal:      General: There is no distension.     Palpations: Abdomen is soft.     Tenderness: There is no abdominal tenderness.  Musculoskeletal:        General: Normal range of motion.     Lumbar back: Tenderness present.       Back:  Skin:    General: Skin is warm and dry.  Neurological:     General: No focal deficit present.     Mental Status: She is alert and oriented to person, place, and time.  Psychiatric:        Mood and Affect: Mood normal.        Behavior: Behavior normal.        Thought Content: Thought content normal.        Judgment: Judgment normal.     Imaging: MR LUMBAR SPINE WO CONTRAST  Result Date: 09/02/2019 CLINICAL DATA:  Low back pain. EXAM: MRI LUMBAR SPINE WITHOUT CONTRAST TECHNIQUE: Multiplanar, multisequence MR imaging of the lumbar spine was performed. No intravenous contrast was administered. COMPARISON:  Lumbar spine x-rays dated August 26, 2019. MRI lumbar spine dated September 22, 2018. FINDINGS: Segmentation:  Standard. Alignment:  Physiologic. Vertebrae: Acute to subacute L4 inferior endplate compression fracture with 35% height loss centrally. Subacute L5 superior endplate compression fracture with 30% height loss centrally. Subacute fracture of the right L5 pedicle (series 7, image 5). Chronic L3 and L4 superior endplate compression deformities. No evidence of discitis or focal bone lesion. Conus medullaris and cauda equina: Conus extends to the L1 level. Conus and cauda equina appear normal. Paraspinal and other soft tissues: Negative. Disc levels: T12-L1 to L3-L4: No significant disc bulge or herniation. No stenosis. L4-L5: No significant disc bulge or herniation. Trace retropulsion of the L4 inferior and L5 superior endplates. New mild bilateral neuroforaminal stenosis. No spinal canal stenosis.  L5-S1: Negative disc. Mild bilateral facet arthropathy. No stenosis. IMPRESSION: 1. Acute to subacute L4 inferior endplate and L5 superior endplate compression fractures 2.  Subacute fracture of the right L5 pedicle. These results will be called to the ordering clinician or representative by the Radiology Department at the imaging location. Electronically Signed   By: Titus Dubin M.D.   On: 09/02/2019 16:46   XR KNEE 3 VIEW LEFT  Result Date: 08/26/2019 Moderate medial compartment narrowing and moderate chondromalacia patella was noted.  Chondrocalcinosis was noted.  Flabella was noted. Impression: These findings are consistent with moderate osteoarthritis, moderate chondromalacia patella and chondrocalcinosis.  XR KNEE 3 VIEW RIGHT  Result Date: 08/26/2019 Moderate medial compartment narrowing was noted.  Mild patellofemoral narrowing was noted.  Chondrocalcinosis was noted. Impression: These findings are consistent with moderate osteoarthritis, mild chondromalacia patella and chondrocalcinosis.  XR Lumbar Spine 2-3 Views  Result Date: 08/26/2019 Multilevel spondylosis with lateral osteophytes are noted at multiple levels.  L3, L4, and possibly L5 compression fractures were noted.  Facet joint arthropathy was noted. Impression: These findings are consistent with multilevel spondylosis, facet joint arthropathy and compression fractures.  These compression fractures are old when compared to the previous x-rays and MRI report.  IR Radiologist Eval & Mgmt  Result Date: 09/08/2019 Please refer to notes tab for details about interventional procedure. (Op Note)   Labs:  CBC: Recent Labs    10/27/18 1406 01/05/19 1340 05/11/19 1128 08/20/19 1152  WBC 6.8 5.9 6.7 8.3  HGB 10.5* 10.1* 9.8* 9.7*  HCT 34.3* 33.5*  31.9* 32.0* 31.8*  PLT 129* 175 226 256    COAGS: No results for input(s): INR, APTT in the last 8760 hours.  BMP: Recent Labs    12/16/18 1523 01/27/19 1542 04/29/19 1451 07/30/19 1457  NA 137 139 138 138  K 3.7 4.3 4.1 4.3  CL 101 102 101 99  CO2 28 29 30  32  GLUCOSE 167* 148* 118* 175*  BUN 32* 26* 29* 33*  CALCIUM 9.8 9.8 9.5 9.8   CREATININE 2.22* 1.64* 1.66* 1.74*    LIVER FUNCTION TESTS: Recent Labs    10/30/18 1527 01/27/19 1542 04/29/19 1451  BILITOT 0.3 0.4 0.3  AST 15 24 17   ALT 12 20 10   ALKPHOS 75 85 68  PROT 6.0 6.7 6.4  ALBUMIN 4.0 4.4 4.0    TUMOR MARKERS: No results for input(s): AFPTM, CEA, CA199, CHROMGRNA in the last 8760 hours.  Assessment and Plan:  Subacute L4 and L5 lumbar compression fracture deformities.  Will proceed with image guided kyphoplasty with cement augmentation today by Dr. Vernard Gambles.  Risks and benefits of L4-5 kyphoplasty were discussed with the patient including, but not limited to education regarding the natural healing process of compression fractures without intervention, bleeding, infection, cement migration which may cause spinal cord damage, paralysis, pulmonary embolism or even death.  This interventional procedure involves the use of X-rays and because of the nature of the planned procedure, it is possible that we will have prolonged use of X-ray fluoroscopy.  Potential radiation risks to you include (but are not limited to) the following: - A slightly elevated risk for cancer  several years later in life. This risk is typically less than 0.5% percent. This risk is low in comparison to the normal incidence of human cancer, which is 33% for women and 50% for men according to the [employer]. - Radiation induced injury can include skin redness, resembling a rash, tissue breakdown / ulcers  and hair loss (which can be temporary or permanent).   The likelihood of either of these occurring depends on the difficulty of the procedure and whether you are sensitive to radiation due to previous procedures, disease, or genetic conditions.   IF your procedure requires a prolonged use of radiation, you will be notified and given written instructions for further action.  It is your responsibility to monitor the irradiated area for the 2 weeks following the  procedure and to notify your physician if you are concerned that you have suffered a radiation induced injury.    All of the patient's questions were answered, patient is agreeable to proceed.  Consent signed and in chart.  Thank you for this interesting consult.  I greatly enjoyed meeting Abeeha Twist and look forward to participating in their care.  A copy of this report was sent to the requesting provider on this date.  Electronically Signed: Murrell Redden, PA-C   09/09/2019, 10:09 AM      I spent a total of    15 Minutes in face to face in clinical consultation, greater than 50% of which was counseling/coordinating care for kyphoplasty.

## 2019-09-09 NOTE — Progress Notes (Addendum)
Abigail Hoover , [place] notified of client requesting pain prescription and she will send order to client's pharmacy

## 2019-09-13 ENCOUNTER — Telehealth: Payer: Self-pay | Admitting: Rheumatology

## 2019-09-13 NOTE — Telephone Encounter (Signed)
Patient was diagnosed with vertebral fracture on the MRI.  She is on Boniva.  She needs more aggressive therapy.  Please check with prescribing Boniva.  We need to send a message to the prescribing provider. Bo Merino, MD

## 2019-09-15 ENCOUNTER — Telehealth: Payer: Self-pay | Admitting: *Deleted

## 2019-09-15 ENCOUNTER — Other Ambulatory Visit: Payer: Self-pay | Admitting: Rheumatology

## 2019-09-15 DIAGNOSIS — M810 Age-related osteoporosis without current pathological fracture: Secondary | ICD-10-CM

## 2019-09-15 NOTE — Telephone Encounter (Signed)
Please notify patient that she needs to get labs in order to apply for Forteo injections for the treatment of her osteoporosis and vertebral fractures.  We have taken permission from Dr. Terrence Dupont.  Once Forteo is approved then we will discontinue Boniva.  Please apply for Forteo once labs are available.  Patient has been in a lot of discomfort due to vertebral fracture.  Whenever she is ready she can get a bone density as well.

## 2019-09-15 NOTE — Telephone Encounter (Signed)
Contacted Dr. Terrence Dupont and advised him per Dr. Estanislado Pandy patient has had vertebral fractures and is currently on Boniva. Advised Dr. Terrence Dupont, per Dr. Estanislado Pandy , patient needs to be on Forteo for treatment. Dr. Terrence Dupont would like for [place] to apply for this and prescribe for patient.

## 2019-09-16 ENCOUNTER — Telehealth: Payer: Self-pay | Admitting: Endocrinology

## 2019-09-16 ENCOUNTER — Telehealth: Payer: Medicare Other

## 2019-09-16 NOTE — Telephone Encounter (Signed)
Patient requests to switch from Dr. Dwyane Dee to Dr. Cruzita Lederer.

## 2019-09-16 NOTE — Telephone Encounter (Signed)
Per Dr. Estanislado Pandy, Dr. Collene Mares has spoken with patient and advised patient she is due to come into the office to have labs for [facility]. Patient will come to the office next week to have labs performed.  Patient is aware she will need to update her bone density scan as well.   Amber: Per Dr. Estanislado Pandy, once labs have results and are normal , please apply for Forteo. Thanks!

## 2019-09-16 NOTE — Telephone Encounter (Signed)
Please advise 

## 2019-09-16 NOTE — Telephone Encounter (Signed)
I have declined this for now due to lack of availability.

## 2019-09-16 NOTE — Telephone Encounter (Signed)
Attempted to contact the patient and left message for patient to call the office.  

## 2019-09-17 ENCOUNTER — Other Ambulatory Visit: Payer: Medicare Other

## 2019-09-17 NOTE — Telephone Encounter (Signed)
Patient is aware of this decision. She understands and said she would think about what her next steps will be then call us back

## 2019-09-20 ENCOUNTER — Other Ambulatory Visit: Payer: Self-pay

## 2019-09-20 DIAGNOSIS — M810 Age-related osteoporosis without current pathological fracture: Secondary | ICD-10-CM | POA: Diagnosis not present

## 2019-09-21 ENCOUNTER — Other Ambulatory Visit: Payer: Self-pay | Admitting: Endocrinology

## 2019-09-21 NOTE — Progress Notes (Signed)
GFR is low.  If patient does not have a nephrologist she should get nephrology evaluation.  Patient be able to start her on Forteo once PTH results are available and it is approved by the insurance.

## 2019-09-22 ENCOUNTER — Telehealth: Payer: Self-pay | Admitting: Pharmacy Technician

## 2019-09-22 ENCOUNTER — Encounter: Payer: Self-pay | Admitting: Rheumatology

## 2019-09-22 ENCOUNTER — Ambulatory Visit: Payer: Medicare Other | Admitting: Rheumatology

## 2019-09-22 DIAGNOSIS — Z780 Asymptomatic menopausal state: Secondary | ICD-10-CM | POA: Diagnosis not present

## 2019-09-22 DIAGNOSIS — M85851 Other specified disorders of bone density and structure, right thigh: Secondary | ICD-10-CM | POA: Diagnosis not present

## 2019-09-22 NOTE — Telephone Encounter (Signed)
Submitted a Prior Authorization request to [employer] for FORTEO via Cover My Meds. Will update once we receive a response.   (Key: [id number])

## 2019-09-22 NOTE — Telephone Encounter (Signed)
Received notification from [facility] regarding a prior authorization for [place]. Authorization has been APPROVED from 09/22/19 to 09/21/21.   Authorization # [id number]  Will run test claim once PA, has been updated in system by plan.

## 2019-09-22 NOTE — Progress Notes (Signed)
All the labs are available.  Please apply for Forteo or Tymlos.  She has severe osteoporosis and multiple vertebral fractures.  Do not see a follow-up appointment is scheduled with [place].  Which can be scheduled after Forteo is approved.

## 2019-09-23 ENCOUNTER — Telehealth: Payer: Self-pay | Admitting: Rheumatology

## 2019-09-23 LAB — COMPLETE METABOLIC PANEL WITH GFR
AG Ratio: 1.9 (calc) (ref 1.0–2.5)
ALT: 6 U/L (ref 6–29)
AST: 16 U/L (ref 10–35)
Albumin: 3.9 g/dL (ref 3.6–5.1)
Alkaline phosphatase (APISO): 82 U/L (ref 37–153)
BUN/Creatinine Ratio: 15 (calc) (ref 6–22)
BUN: 32 mg/dL — ABNORMAL HIGH (ref 7–25)
CO2: 29 mmol/L (ref 20–32)
Calcium: 9.9 mg/dL (ref 8.6–10.4)
Chloride: 100 mmol/L (ref 98–110)
Creat: 2.18 mg/dL — ABNORMAL HIGH (ref 0.60–0.93)
GFR, Est African American: 25 mL/min/{1.73_m2} — ABNORMAL LOW (ref >=60)
GFR, Est Non African American: 21 mL/min/{1.73_m2} — ABNORMAL LOW (ref >=60)
Globulin: 2.1 g/dL (calc) (ref 1.9–3.7)
Glucose, Bld: 98 mg/dL (ref 65–99)
Potassium: 4.4 mmol/L (ref 3.5–5.3)
Sodium: 138 mmol/L (ref 135–146)
Total Bilirubin: 0.5 mg/dL (ref 0.2–1.2)
Total Protein: 6 g/dL — ABNORMAL LOW (ref 6.1–8.1)

## 2019-09-23 LAB — PROTEIN ELECTROPHORESIS, SERUM, WITH REFLEX
Albumin ELP: 3.9 g/dL (ref 3.8–4.8)
Alpha 1: 0.3 g/dL (ref 0.2–0.3)
Alpha 2: 0.8 g/dL (ref 0.5–0.9)
Beta 2: 0.3 g/dL (ref 0.2–0.5)
Beta Globulin: 0.3 g/dL — ABNORMAL LOW (ref 0.4–0.6)
Gamma Globulin: 0.4 g/dL — ABNORMAL LOW (ref 0.8–1.7)
Total Protein: 6 g/dL — ABNORMAL LOW (ref 6.1–8.1)

## 2019-09-23 LAB — PARATHYROID HORMONE, INTACT (NO CA): PTH: 46 pg/mL (ref 14–64)

## 2019-09-23 LAB — VITAMIN D 25 HYDROXY (VIT D DEFICIENCY, FRACTURES): Vit D, 25-Hydroxy: 70 ng/mL (ref 30–100)

## 2019-09-23 LAB — IFE INTERPRETATION: Immunofix Electr Int: NOT DETECTED

## 2019-09-23 NOTE — Telephone Encounter (Signed)
Caryl Pina from [facility] called stating patient's prescription of [place] has been approved until 09/21/2021.

## 2019-09-23 NOTE — Telephone Encounter (Signed)
Ran test claim, patient;s copay for 1 month supply is $173.62. Patient is in catastrophic coverage and copays could vary. Patient should apply for [facility] patient assistance.

## 2019-09-24 ENCOUNTER — Telehealth: Payer: Self-pay | Admitting: *Deleted

## 2019-09-24 ENCOUNTER — Telehealth: Payer: Self-pay | Admitting: Rheumatology

## 2019-09-24 NOTE — Telephone Encounter (Signed)
Received DEXA results from [facility].  Date of Scan: 09/22/2019 Lowest T-score and site measured: -2.2 Right Femoral Neck Significant changes in BMD and site measured (5% and above): n/a  Current Regimen: Currently on Boniva  Recommendation: Applying for Forteo.

## 2019-09-24 NOTE — Telephone Encounter (Signed)
Spoke with patient and advised her of Bone Density Scan results.

## 2019-09-24 NOTE — Telephone Encounter (Signed)
Patient advised all the labs are available and we have applied for Forteo. Patient advised it was approved through her insurance with a co pay of  $173.62 for a one month supply. Patient advised she should apply for patient assistance and will send application. Patient advised prescription will not be sent to the pharmacy at this time. Patient does not need to follow up until after she is approved for Forteo with patient assistance.

## 2019-09-24 NOTE — Telephone Encounter (Addendum)
Patient left a message asking for a return call. Patient states she needs to speak with doctor about injections. [employer] has approved them. #1. Patient needs to find out when to start them, and when they will be called into pharamacy. #2. Patient also needs to know when to follow up in the office for an appointment since she missed her last one. Please call to advise.

## 2019-09-29 ENCOUNTER — Other Ambulatory Visit: Payer: Self-pay

## 2019-09-29 ENCOUNTER — Ambulatory Visit (INDEPENDENT_AMBULATORY_CARE_PROVIDER_SITE_OTHER): Payer: Medicare Other | Admitting: Pharmacist

## 2019-09-29 ENCOUNTER — Other Ambulatory Visit: Payer: Self-pay | Admitting: Pharmacist

## 2019-09-29 DIAGNOSIS — M810 Age-related osteoporosis without current pathological fracture: Secondary | ICD-10-CM

## 2019-09-29 MED ORDER — "PEN NEEDLES 3/16"" 31G X 5 MM MISC": 2 refills | Status: DC

## 2019-09-29 MED ORDER — TERIPARATIDE 620 MCG/2.48ML ~~LOC~~ SOPN: 20.0000 ug | PEN_INJECTOR | Freq: Every day | SUBCUTANEOUS | 5 refills | Status: DC

## 2019-09-29 NOTE — Progress Notes (Signed)
.   Pharmacy Note   Subjective: Patient presents today to [facility] for follow up office visit.  Patient seen by the pharmacist for counseling on Forteo.     Objective: CMP     Component Value Date/Time   NA 138 09/20/2019 1506   NA 138 12/12/2016 1150   K 4.4 09/20/2019 1506   K 4.6 12/12/2016 1150   CL 100 09/20/2019 1506   CL 103 07/27/2012 1259   CO2 29 09/20/2019 1506   CO2 31 (H) 12/12/2016 1150   GLUCOSE 98 09/20/2019 1506   GLUCOSE 149 (H) 12/12/2016 1150   GLUCOSE 118 (H) 07/27/2012 1259   BUN 32 (H) 09/20/2019 1506   BUN 23.4 12/12/2016 1150   CREATININE 2.18 (H) 09/20/2019 1506   CREATININE 1.7 (H) 12/12/2016 1150   CALCIUM 9.9 09/20/2019 1506   CALCIUM 11.4 (H) 12/12/2016 1150   PROT 6.0 (L) 09/20/2019 1506   PROT 6.0 (L) 09/20/2019 1506   PROT 6.5 12/12/2016 1150   ALBUMIN 4.0 04/29/2019 1451   ALBUMIN 3.9 12/12/2016 1150   AST 16 09/20/2019 1506   AST 19 12/12/2016 1150   ALT 6 09/20/2019 1506   ALT 10 12/12/2016 1150   ALKPHOS 68 04/29/2019 1451   ALKPHOS 65 12/12/2016 1150   BILITOT 0.5 09/20/2019 1506   BILITOT 0.36 12/12/2016 1150   GFRNONAA 21 (L) 09/20/2019 1506   GFRAA 25 (L) 09/20/2019 1506    Baseline Lab Monitoring Lab Results  Component Value Date   VD25OH 70 09/20/2019   Lab Results  Component Value Date   PTH 46 09/20/2019   CALCIUM 9.9 09/20/2019   PHOS 3.8 01/06/2009    Lab Results  Component Value Date   TSH 0.20 (L) 07/30/2019     Assessment/Plan: Had detailed discussion of osteoporosis and goals of therapy.  Counseled patient on the purpose, proper use, and adverse effects of Forteo.  Discussed maximum use of parathyroid hormone analog of two years and then patient would need to start another osteoporosis medication.  Patient voiced understanding.  Patient denies any questions or concerns at this time.   Demonstrated proper injection technique with Forteo demo pen.  Patient able to demonstrate proper injection  technique using the teach back method.    Forteo approved through [employer].  Her co-pay is almost $200 a month as she is in the catastrophic coverage.  Reviewed income requirements for [employer] and patient states she would not qualify.  Patient is okay with current co-pay.  Prescription sent to [facility] long outpatient pharmacy along with pen needles.  Prescription will be scheduled to fill in ship on Monday 7/12 to arrived patient on Tuesday 7/13.  Instructed patient to call with any issues or if she does not receive her prescription by Wednesday 7/14.  Patient verbalized understanding.  All questions encouraged and answered.  Instructed patient to call with any further questions or concerns.  Mariella Saa, PharmD, [place], CPP Clinical Specialty Pharmacist (Rheumatology and Pulmonology)  09/29/2019 10:39 AM

## 2019-09-30 ENCOUNTER — Telehealth: Payer: Self-pay | Admitting: Rheumatology

## 2019-09-30 NOTE — Telephone Encounter (Signed)
Patient called stating she has a question she forgot to ask Amber yesterday and requesting a return call.

## 2019-09-30 NOTE — Telephone Encounter (Signed)
Patient had questions about injection technique.  Review important counseling points for injecting and offered additional training.  Patient declined but will schedule if she has issues with her first dose.  Reminded patient to discontinue Bonvia.  Patient verbalized understanding.  All questions encouraged and answered.   Mariella Saa, PharmD, [place], CPP Clinical Specialty Pharmacist (Rheumatology and Pulmonology)  09/30/2019 1:17 PM

## 2019-10-04 ENCOUNTER — Telehealth: Payer: Self-pay | Admitting: Pharmacy Technician

## 2019-10-04 DIAGNOSIS — M810 Age-related osteoporosis without current pathological fracture: Secondary | ICD-10-CM

## 2019-10-04 NOTE — Telephone Encounter (Signed)
Received message from pharmacy stating pen needles come in 100 count box and can not be broken. Requested an updated rx to be sent to pharmacy.

## 2019-10-05 ENCOUNTER — Other Ambulatory Visit (HOSPITAL_COMMUNITY): Payer: Self-pay | Admitting: Pharmacist

## 2019-10-05 MED ORDER — "PEN NEEDLES 3/16"" 31G X 5 MM MISC": 2 refills | Status: DC

## 2019-10-05 MED FILL — UNIFINE PENTIPS 31GX3/16: 31G X 5 MM | 90 days supply | Qty: 100 | Fill #0

## 2019-10-05 NOTE — Telephone Encounter (Signed)
New Prescription sent to pharmacy for quantity of 100.   Mariella Saa, PharmD, [place], CPP Clinical Specialty Pharmacist (Rheumatology and Pulmonology)  10/05/2019 8:30 AM

## 2019-10-06 MED FILL — FORTEO 600 MCG/2.4 ML PEN I: 620 | 28 days supply | Qty: 2 | Fill #0

## 2019-10-21 DIAGNOSIS — M542 Cervicalgia: Secondary | ICD-10-CM | POA: Diagnosis not present

## 2019-10-22 NOTE — Progress Notes (Signed)
Office Visit Note  Patient: Abigail Hoover             Date of Birth: 1945/08/25           MRN: [id number]             PCP: [name], MD Referring: [name], MD Visit Date: 10/26/2019 Occupation: @GUAROCC @  Subjective:  Medication management.   History of Present Illness: Abigail Hoover is a 74 y.o. female with history of osteoporosis and osteoarthritis.  She was started on Forteo injections on September 29, 2019.  She has been taking daily Forteo injections without any problems.  She has been tolerating the medication well.  She states she cannot take calcium and vitamin D due to her renal disease.  She is trying to increase dietary calcium.  She has some discomfort in her hands and knee joints which is tolerable.  She states she has done really well and has not been taking tramadol since she had kyphoplasty.  She has been active at home.  Activities of Daily Living:  Patient reports morning stiffness for a few minutes.   Patient Denies nocturnal pain.  Difficulty dressing/grooming: Denies Difficulty climbing stairs: Denies Difficulty getting out of chair: Denies Difficulty using hands for taps, buttons, cutlery, and/or writing: Denies  Review of Systems  Constitutional: Negative for fatigue.  HENT: Negative for mouth sores, mouth dryness and nose dryness.   Eyes: Negative for itching and dryness.  Respiratory: Negative for shortness of breath and difficulty breathing.   Cardiovascular: Positive for swelling in legs/feet. Negative for chest pain and palpitations.  Gastrointestinal: Negative for blood in stool, constipation and diarrhea.  Endocrine: Negative for increased urination.  Genitourinary: Negative for difficulty urinating.  Musculoskeletal: Positive for morning stiffness. Negative for arthralgias, joint pain, joint swelling, myalgias, muscle tenderness and myalgias.  Skin: Negative for color change, rash and redness.  Allergic/Immunologic: Negative for susceptible  to infections.  Neurological: Positive for memory loss. Negative for dizziness, numbness, headaches and weakness.  Hematological: Positive for bruising/bleeding tendency.  Psychiatric/Behavioral: Negative for confusion.    PMFS History:  Patient Active Problem List   Diagnosis Date Noted  . DDD (degenerative disc disease), lumbar 08/26/2019  . Age-related osteoporosis without current pathological fracture 08/26/2019  . Primary osteoarthritis of both knees 08/26/2019  . Primary osteoarthritis of both hands 08/26/2019  . Diabetes mellitus with chronic kidney disease, with long-term current use of insulin ([place]) 05/11/2019  . Pain due to onychomycosis of toenails of both feet 09/30/2018  . Chronic pain of right thumb 08/27/2018  . Dry skin 03/12/2018  . Other fatigue 09/18/2017  . At high risk for breast cancer 03/06/2017  . Hypercalcemia 12/16/2016  . Vitamin B 12 deficiency 12/16/2016  . Displaced fracture of left femoral neck ([place]) 03/02/2016  . Hyperkalemia 03/01/2016  . Femoral neck fracture ([place]) 02/29/2016  . Anemia due to stage 3 (moderate) chronic renal failure treated with erythropoietin 12/18/2015  . Chronic kidney disease, stage III (moderate) 08/14/2015  . 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 Acute coronary syndrome ([place]) 01/02/2015  . Deficiency anemia 05/27/2014  . Carpal tunnel syndrome 12/09/2013  . Diabetic neuropathy ([place]) 12/09/2013  . Type 2 diabetes mellitus with neurological manifestations, controlled ([place]) 04/21/2013  . Disturbance of skin sensation 04/21/2013  . Hypothyroidism 10/30/2012  . Breast mass in female-left 03/10/2012  . Weight loss 11/28/2011  . Family history of breast cancer 08/01/2010  . Essential hypertension 02/01/2009  . EDEMA LEGS 02/01/2009  Past Medical History:  Diagnosis Date  . 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 Anxiety   . Arthritis   . Bladder irritation   . Diabetes mellitus   . GERD (gastroesophageal  reflux disease)   . Hypercholesterolemia   . Hypertension   . Hypothyroidism   . Iron deficiency anemia, unspecified   . Psoriasis   . Transfusion history 03/05/2016   for postoperative (ORIF) anemia superimposed on chronic anemia    Family History  Problem Relation Age of Onset  . Heart disease Mother        heart attack  . Stroke Father        brain hemorrhage  . Diabetes Brother   . Breast cancer Sister   . Cancer Sister        breast cancer   Past Surgical History:  Procedure Laterality Date  . ABDOMINAL HYSTERECTOMY     TAH  . BREAST BIOPSY     Left breast biopsy benign lesion  . BREAST BIOPSY  03/11/2012   Procedure: BREAST BIOPSY;  Surgeon: Odis Hollingshead, MD;  Location: [facility];  Service: General;  Laterality: Left;  remove left breast mass  . BREAST EXCISIONAL BIOPSY Left   . CARDIAC CATHETERIZATION N/A 01/02/2015   Procedure: Left Heart Cath and Coronary Angiography;  Surgeon: Charolette Forward, MD;  Location: [facility];  Service: Cardiovascular;  Laterality: N/A;  . CATARACT EXTRACTION    . CERVICAL DISC SURGERY    . COLONOSCOPY  2013   neg. next 2023.   Marland Kitchen EYE SURGERY    . IR KYPHO EA ADDL LEVEL THORACIC OR LUMBAR  09/09/2019  . IR KYPHO LUMBAR INC FX REDUCE BONE BX UNI/BIL CANNULATION INC/IMAGING  09/09/2019  . IR RADIOLOGIST EVAL & MGMT  09/08/2019  . LEFT HIP SURGERY    . TOTAL HIP ARTHROPLASTY Left 03/02/2016   Procedure: TOTAL HIP ARTHROPLASTY ANTERIOR APPROACH;  Surgeon: Rod Can, MD;  Location: [facility];  Service: Orthopedics;  Laterality: Left;   Social History   Social History Narrative   Patient is married with one child.   Patient is right handed.   Patient has college education.   Patient drinks tea, two times a day   Immunization History  Administered Date(s) Administered  . Fluad Quad(high Dose 65+) 12/22/2018  . Influenza Split 11/13/2010  . Influenza, High Dose Seasonal PF 01/25/2016, 12/24/2016  . Influenza,inj,Quad PF,6+ Mos  12/29/2012, 12/20/2013, 01/16/2015  . Pneumococcal Conjugate-13 01/16/2015  . Pneumococcal Polysaccharide-23 03/24/2013     Objective: Vital Signs: BP (!) 114/52 (BP Location: Left Arm, Patient Position: Sitting, Cuff Size: Normal)   Pulse 61   Resp 14   Ht 5\' 1"  (1.549 m)   Wt 121 lb 9.6 oz (55.2 kg)   BMI 22.98 kg/m    Physical Exam Vitals and nursing note reviewed.  Constitutional:      Appearance: She is well-developed.  HENT:     Head: Normocephalic and atraumatic.  Eyes:     Conjunctiva/sclera: Conjunctivae normal.  Cardiovascular:     Rate and Rhythm: Normal rate and regular rhythm.     Heart sounds: Normal heart sounds.  Pulmonary:     Effort: Pulmonary effort is normal.     Breath sounds: Normal breath sounds.  Abdominal:     General: Bowel sounds are normal.     Palpations: Abdomen is soft.  Musculoskeletal:     Cervical back: Normal range of motion.  Lymphadenopathy:  Cervical: No cervical adenopathy.  Skin:    General: Skin is warm and dry.     Capillary Refill: Capillary refill takes less than 2 seconds.  Neurological:     Mental Status: She is alert and oriented to person, place, and time.  Psychiatric:        Behavior: Behavior normal.      Musculoskeletal Exam: She has limited range of motion of her cervical spine.  She has thoracic and lumbar kyphosis with limited range of motion of her lumbar spine and thoracic spine.  Shoulder joint abduction was about 100 degrees bilaterally.  Elbow joints and wrist joints were in good range of motion.  She has mild PIP and DIP thickening with no synovitis.  Knee joints and ankle joints with good range of motion.  There was no tenderness over MTPs.  CDAI Exam: CDAI Score: -- Patient Global: --; Provider Global: -- Swollen: --; Tender: -- Joint Exam 10/26/2019   No joint exam has been documented for this visit   There is currently no information documented on the homunculus. Go to the Rheumatology activity  and complete the homunculus joint exam.  Investigation: No additional findings.  Imaging: No results found.  Recent Labs: Lab Results  Component Value Date   WBC 4.8 09/09/2019   HGB 10.6 (L) 09/09/2019   PLT 242 09/09/2019   NA 138 09/20/2019   K 4.4 09/20/2019   CL 100 09/20/2019   CO2 29 09/20/2019   GLUCOSE 98 09/20/2019   BUN 32 (H) 09/20/2019   CREATININE 2.18 (H) 09/20/2019   BILITOT 0.5 09/20/2019   ALKPHOS 68 04/29/2019   AST 16 09/20/2019   ALT 6 09/20/2019   PROT 6.0 (L) 09/20/2019   PROT 6.0 (L) 09/20/2019   ALBUMIN 4.0 04/29/2019   CALCIUM 9.9 09/20/2019   GFRAA 25 (L) 09/20/2019   August 26, 2019 iron percent saturation 13, RF negative, anti-CCP negative, uric acid 7.9, magnesium normal  September 20, 2019 SPEP abnormal, IFE no monoclonal protein detected, vitamin D 70, PTH normal,  Speciality Comments: No specialty comments available.  Procedures:  No procedures performed Allergies: Atorvastatin and Ezetimibe   Assessment / Plan:     Visit Diagnoses: Age-related osteoporosis withcurrent pathological fracture, sequela-DXADate of Scan: 09/22/2019 Lowest T-score and site measured: -2.2 Right Femoral Neck.  Significant changes in BMD and site measured (5% and above): Patient had been on Boniva for many years.  MRI from September 22, 2018 showed L3-L4 old compression fracture.   Patient presented at the last visit with severe lower back pain and discomfort.  At the time the MRI was obtained which showed acute to subacute L4 endplate and L5 superior endplate compression fractures.  She was referred for kyphoplasty.  She had gradual recovery after the kyphoplasty.  She was a lot of discomfort.  Now she is almost pain-free.  She has significant thoracic and lumbar kyphosis due to vertebral fractures.  She is unable to take calcium and vitamin D due to renal disease.  Dietary intake of calcium was discussed.  Have also advised her to discuss this further with Dr. Posey Pronto.  She was  started on Forteo on September 29, 2019  Vertebral fracture, osteoporotic, sequela-status post kyphoplasty.  Other secondary kyphosis, thoracolumbar region-she would benefit from physical therapy.  But she does not have transportation.  Have discussed the stretching exercises.  DDD (degenerative disc disease), lumbar-she has chronic discomfort which is manageable currently.  Facet arthropathy, lumbar-doing better.  Primary osteoarthritis of both  hands-joint protection was discussed.  Primary osteoarthritis of both knees-she has bilateral moderate osteoarthritis and mild chondromalacia patella.  She also has chondrocalcinosis.  She has not had any recent flares.  She has chronic discomfort in her knee joints.  Chondrocalcinosis-she has not experienced any recent flare.  Hyperuricemia-there is no history of gout.  Forteo can cause hyperuricemia in patients.  We will have to monitor closely for gouty arthropathy.  Displaced fracture of left femoral neck (HCC)  Psoriasis-patient goes to dermatologist and uses topical agents as needed.  She denies any active lesions currently.  Essential hypertension-blood pressure is low but stable.  History of diabetes mellitus  Other diabetic neurological complication associated with type 2 diabetes mellitus ([place])  Anemia due to stage 3 (moderate) chronic renal failure treated with erythropoietin-followed by Dr. Graylon Gunning.  She will be getting labs through Dr. Serita Grit office.  Have advised to report forward those labs to [place] for review.  Hypothyroidism, unspecified type-followed by Dr. Dwyane Dee.  Orders: No orders of the defined types were placed in this encounter.  No orders of the defined types were placed in this encounter.     Follow-Up Instructions: Return in about 6 months (around 04/27/2020) for Osteoarthritis, Osteoporosis.   Bo Merino, MD  Note - This record has been created using Editor, commissioning.  Chart creation errors have been  sought, but may not always  have been located. Such creation errors do not reflect on  the standard of medical care.

## 2019-10-26 ENCOUNTER — Ambulatory Visit: Payer: Medicare Other | Admitting: Hematology and Oncology

## 2019-10-26 ENCOUNTER — Other Ambulatory Visit: Payer: Medicare Other

## 2019-10-26 ENCOUNTER — Other Ambulatory Visit: Payer: Self-pay

## 2019-10-26 ENCOUNTER — Ambulatory Visit: Payer: Medicare Other | Admitting: Rheumatology

## 2019-10-26 ENCOUNTER — Ambulatory Visit: Payer: Medicare Other

## 2019-10-26 ENCOUNTER — Encounter: Payer: Self-pay | Admitting: Rheumatology

## 2019-10-26 VITALS — BP 114/52 | HR 61 | Resp 14 | Ht 61.0 in | Wt 121.6 lb

## 2019-10-26 DIAGNOSIS — M19042 Primary osteoarthritis, left hand: Secondary | ICD-10-CM

## 2019-10-26 DIAGNOSIS — M8000XS Age-related osteoporosis with current pathological fracture, unspecified site, sequela: Secondary | ICD-10-CM

## 2019-10-26 DIAGNOSIS — L409 Psoriasis, unspecified: Secondary | ICD-10-CM

## 2019-10-26 DIAGNOSIS — D631 Anemia in chronic kidney disease: Secondary | ICD-10-CM

## 2019-10-26 DIAGNOSIS — E790 Hyperuricemia without signs of inflammatory arthritis and tophaceous disease: Secondary | ICD-10-CM

## 2019-10-26 DIAGNOSIS — M51369 Other intervertebral disc degeneration, lumbar region without mention of lumbar back pain or lower extremity pain: Secondary | ICD-10-CM

## 2019-10-26 DIAGNOSIS — M47816 Spondylosis without myelopathy or radiculopathy, lumbar region: Secondary | ICD-10-CM

## 2019-10-26 DIAGNOSIS — M5136 Other intervertebral disc degeneration, lumbar region: Secondary | ICD-10-CM | POA: Diagnosis not present

## 2019-10-26 DIAGNOSIS — M4015 Other secondary kyphosis, thoracolumbar region: Secondary | ICD-10-CM

## 2019-10-26 DIAGNOSIS — M8008XS Age-related osteoporosis with current pathological fracture, vertebra(e), sequela: Secondary | ICD-10-CM | POA: Diagnosis not present

## 2019-10-26 DIAGNOSIS — S72002A Fracture of unspecified part of neck of left femur, initial encounter for closed fracture: Secondary | ICD-10-CM

## 2019-10-26 DIAGNOSIS — E039 Hypothyroidism, unspecified: Secondary | ICD-10-CM

## 2019-10-26 DIAGNOSIS — M19041 Primary osteoarthritis, right hand: Secondary | ICD-10-CM

## 2019-10-26 DIAGNOSIS — M1120 Other chondrocalcinosis, unspecified site: Secondary | ICD-10-CM

## 2019-10-26 DIAGNOSIS — I10 Essential (primary) hypertension: Secondary | ICD-10-CM

## 2019-10-26 DIAGNOSIS — E1149 Type 2 diabetes mellitus with other diabetic neurological complication: Secondary | ICD-10-CM

## 2019-10-26 DIAGNOSIS — M170 Bilateral primary osteoarthritis of knee: Secondary | ICD-10-CM

## 2019-10-26 DIAGNOSIS — N183 Chronic kidney disease, stage 3 unspecified: Secondary | ICD-10-CM

## 2019-10-26 DIAGNOSIS — Z8639 Personal history of other endocrine, nutritional and metabolic disease: Secondary | ICD-10-CM

## 2019-10-28 DIAGNOSIS — N3010 Interstitial cystitis (chronic) without hematuria: Secondary | ICD-10-CM | POA: Diagnosis not present

## 2019-10-30 ENCOUNTER — Other Ambulatory Visit: Payer: Self-pay | Admitting: Rheumatology

## 2019-11-01 MED FILL — FORTEO 600 MCG/2.4 ML PEN I: 620 | 28 days supply | Qty: 2 | Fill #1

## 2019-11-01 NOTE — Telephone Encounter (Signed)
Please schedule patient for a follow up visit. Patient due February 2022. Thanks!  

## 2019-11-01 NOTE — Telephone Encounter (Signed)
Patient has a follow up appointment 05/03/2019.

## 2019-11-01 NOTE — Telephone Encounter (Signed)
Last Visit: 10/26/2019 Next Visit: due February 2022. Message sent to the front to schedule patient  Labs: 08/20/2019 RBC 3.78, Hgb 9.7, Hct 31.8, MCH 25.7, 09/20/2019 CMP: BUN 32 Creat. 2.18 GFR 21, total protein 6.0  Current Dose per office note 10/26/2019: not noted  DX: Chondrocalcinosis  Okay to refill Cholchicine?

## 2019-11-02 ENCOUNTER — Other Ambulatory Visit: Payer: Self-pay | Admitting: Hematology and Oncology

## 2019-11-02 ENCOUNTER — Inpatient Hospital Stay: Payer: Medicare Other

## 2019-11-02 ENCOUNTER — Other Ambulatory Visit: Payer: Self-pay

## 2019-11-02 ENCOUNTER — Inpatient Hospital Stay: Payer: Medicare Other | Attending: Hematology and Oncology

## 2019-11-02 ENCOUNTER — Encounter: Payer: Self-pay | Admitting: Hematology and Oncology

## 2019-11-02 ENCOUNTER — Inpatient Hospital Stay (HOSPITAL_BASED_OUTPATIENT_CLINIC_OR_DEPARTMENT_OTHER): Payer: Medicare Other | Admitting: Hematology and Oncology

## 2019-11-02 DIAGNOSIS — E538 Deficiency of other specified B group vitamins: Secondary | ICD-10-CM

## 2019-11-02 DIAGNOSIS — E611 Iron deficiency: Secondary | ICD-10-CM | POA: Diagnosis not present

## 2019-11-02 DIAGNOSIS — E1122 Type 2 diabetes mellitus with diabetic chronic kidney disease: Secondary | ICD-10-CM | POA: Diagnosis not present

## 2019-11-02 DIAGNOSIS — N183 Chronic kidney disease, stage 3 unspecified: Secondary | ICD-10-CM

## 2019-11-02 DIAGNOSIS — D631 Anemia in chronic kidney disease: Secondary | ICD-10-CM

## 2019-11-02 DIAGNOSIS — Z794 Long term (current) use of insulin: Secondary | ICD-10-CM | POA: Insufficient documentation

## 2019-11-02 DIAGNOSIS — K909 Intestinal malabsorption, unspecified: Secondary | ICD-10-CM | POA: Diagnosis not present

## 2019-11-02 DIAGNOSIS — N189 Chronic kidney disease, unspecified: Secondary | ICD-10-CM

## 2019-11-02 DIAGNOSIS — Z803 Family history of malignant neoplasm of breast: Secondary | ICD-10-CM | POA: Diagnosis not present

## 2019-11-02 DIAGNOSIS — Z79899 Other long term (current) drug therapy: Secondary | ICD-10-CM | POA: Insufficient documentation

## 2019-11-02 LAB — CBC WITH DIFFERENTIAL/PLATELET
Abs Immature Granulocytes: 0.01 10*3/uL (ref 0.00–0.07)
Basophils Absolute: 0 10*3/uL (ref 0.0–0.1)
Basophils Relative: 0 %
Eosinophils Absolute: 0.8 10*3/uL — ABNORMAL HIGH (ref 0.0–0.5)
Eosinophils Relative: 14 %
HCT: 29.4 % — ABNORMAL LOW (ref 36.0–46.0)
Hemoglobin: 8.8 g/dL — ABNORMAL LOW (ref 12.0–15.0)
Immature Granulocytes: 0 %
Lymphocytes Relative: 34 %
Lymphs Abs: 2 10*3/uL (ref 0.7–4.0)
MCH: 25.6 pg — ABNORMAL LOW (ref 26.0–34.0)
MCHC: 29.9 g/dL — ABNORMAL LOW (ref 30.0–36.0)
MCV: 85.5 fL (ref 80.0–100.0)
Monocytes Absolute: 0.4 10*3/uL (ref 0.1–1.0)
Monocytes Relative: 6 %
Neutro Abs: 2.8 10*3/uL (ref 1.7–7.7)
Neutrophils Relative %: 46 %
Platelets: 197 10*3/uL (ref 150–400)
RBC: 3.44 MIL/uL — ABNORMAL LOW (ref 3.87–5.11)
RDW: 14.7 % (ref 11.5–15.5)
WBC: 6 10*3/uL (ref 4.0–10.5)
nRBC: 0 % (ref 0.0–0.2)

## 2019-11-02 MED ORDER — CYANOCOBALAMIN 1000 MCG/ML IJ SOLN
1000.0000 ug | Freq: Once | INTRAMUSCULAR | Status: AC
  Administered 2019-11-02: 1000 ug via INTRAMUSCULAR

## 2019-11-02 MED ORDER — DARBEPOETIN ALFA 200 MCG/0.4ML IJ SOSY
200.0000 ug | PREFILLED_SYRINGE | Freq: Once | INTRAMUSCULAR | Status: AC
  Administered 2019-11-02: 200 ug via SUBCUTANEOUS

## 2019-11-02 NOTE — Assessment & Plan Note (Signed)
She is getting monthly B12 injection I will recheck it again in 6 months

## 2019-11-02 NOTE — Assessment & Plan Note (Addendum)
She has multifactorial anemia She has combined recurrent iron deficiency anemia, B12 deficiency as well as anemia chronic kidney disease She is symptomatic We will proceed with darbepoetin injection along with B12 injection today I will start her on IV iron next week, weekly x 2 The most likely cause of her anemia is due to chronic blood loss/malabsorption syndrome. We discussed some of the risks, benefits, and alternatives of intravenous iron infusions. The patient is symptomatic from anemia and the iron level is critically low. She tolerated oral iron supplement poorly and desires to achieved higher levels of iron faster for adequate hematopoesis. Some of the side-effects to be expected including risks of infusion reactions, phlebitis, headaches, nausea and fatigue.  The patient is willing to proceed. Patient education material was dispensed.  Goal is to keep ferritin level greater than 50 and resolution of anemia  I will see her back in 6 months for further follow-up

## 2019-11-02 NOTE — Assessment & Plan Note (Signed)
She has chronic kidney disease secondary to diabetes We will continue to observe closely

## 2019-11-02 NOTE — Progress Notes (Signed)
[facility] OFFICE PROGRESS NOTE  [name], MD  ASSESSMENT & PLAN:  Anemia in chronic kidney disease She has multifactorial anemia She has combined recurrent iron deficiency anemia, B12 deficiency as well as anemia chronic kidney disease She is symptomatic We will proceed with darbepoetin injection along with B12 injection today I will start her on IV iron next week, weekly x 2 The most likely cause of her anemia is due to chronic blood loss/malabsorption syndrome. We discussed some of the risks, benefits, and alternatives of intravenous iron infusions. The patient is symptomatic from anemia and the iron level is critically low. She tolerated oral iron supplement poorly and desires to achieved higher levels of iron faster for adequate hematopoesis. Some of the side-effects to be expected including risks of infusion reactions, phlebitis, headaches, nausea and fatigue.  The patient is willing to proceed. Patient education material was dispensed.  Goal is to keep ferritin level greater than 50 and resolution of anemia  I will see her back in 6 months for further follow-up  Vitamin B 12 deficiency She is getting monthly B12 injection I will recheck it again in 6 months  Chronic kidney disease, stage III (moderate) She has chronic kidney disease secondary to diabetes We will continue to observe closely   No orders of the defined types were placed in this encounter.   The total time spent in the appointment was 20 minutes encounter with patients including review of chart and various tests results, discussions about plan of care and coordination of care plan   All questions were answered. The patient knows to call the clinic with any problems, questions or concerns. No barriers to learning was detected.    [name], MD 8/10/20212:27 PM  INTERVAL HISTORY: Abigail Hoover 74 y.o. female returns for further follow-up She has been getting darbepoetin injection along  with B12 injection on a monthly basis With her improvement of blood counts, her appointment was spaced out a little bit She was referred to see rheumatologist recently and was noted to have iron deficiency based on her iron panel recently dated on 6/3 The patient denies any recent signs or symptoms of bleeding such as spontaneous epistaxis, hematuria or hematochezia.   SUMMARY OF HEMATOLOGIC HISTORY:  This is a patient that has been followed here since 2011 for chronic anemia In 2011, she had a bone marrow aspirate and biopsy that came back normal. The patient has been receiving Aranesp injection for chronic anemia up until 2014. It was subsequently discontinued due stability of the anemia. Starting 05/27/2014, darbepoetin was resumed at 200 g every other month to keep hemoglobin greater than 10 g. Treatment was discontinued in October 2016 and resumed in September 2017 The patient is subsequently found to have severe vitamin B12 deficiency and is started on vitamin B12 injection on December 25, 2016 along with resumption of darbepoetin injection every 5 to 6 weeks.  She is also seen due to family history of breast cancer She also required intravenous iron infusion intermittently for concurrent iron deficiency anemia  I have reviewed the past medical history, past surgical history, social history and family history with the patient and they are unchanged from previous note.  ALLERGIES:  is allergic to atorvastatin and ezetimibe.  MEDICATIONS:  Current Outpatient Medications  Medication Sig Dispense Refill  . aspirin EC 81 MG tablet Take 81 mg by mouth daily. Take 1 tablet by mouth once daily.    Marland Kitchen CARAFATE 1 GM/10ML suspension TAKE 10ML BY  MOUTH 4 (FOUR) TIMES A DAY BETWEEN MEALS AND AT BEDTIME    . colchicine 0.6 MG tablet TAKE ONE-HALF TABLETS (0.3 MG TOTAL) BY MOUTH DAILY. 15 tablet 2  . furosemide (LASIX) 40 MG tablet Take 40 mg by mouth. Take 1 tablet by mouth three times per week.    .  gabapentin (NEURONTIN) 300 MG capsule Take by mouth.    Marland Kitchen glucose blood (ONETOUCH VERIO) test strip Use Onetouch verio test strips to check blood sugar three times daily. (90 day RX) 300 each 8  . HYDROcodone-acetaminophen (NORCO/VICODIN) 5-325 MG tablet Take 1 tablet by mouth every 6 (six) hours as needed for up to 10 doses for moderate pain. 10 tablet 0  . hydrocortisone 2.5 % ointment APPLY TO AFFECED ARES(S) TOPICALLY TWICE DAILY AS NEEDED  3  . insulin aspart (NOVOLOG FLEXPEN) 100 UNIT/ML FlexPen INJECT 12 UNITS UNDER THE SKIN BEFORE BREAKFAST, 4 TO 5 UNITS BEFORE LUNCH, AND 12 UNITS BEFORE DINNER. 15 mL 2  . Insulin Pen Needle (PEN NEEDLES 3/16") 31G X 5 MM MISC Use 1 pen needle to inject Forteo daily. 100 each 2  . Insulin Pen Needle 31G X 5 MM MISC Use with Novolog 100 each 1  . Insulin Syringe-Needle U-100 25G X 5/8" 1 ML MISC USE DAILY TO INJECT INSULIN 100 each 3  . isosorbide mononitrate (IMDUR) 120 MG 24 hr tablet Take 120 mg by mouth daily. Take 1 tablet by mouth once daily.    Marland Kitchen JANUVIA 25 MG tablet TAKE 1 TABLET (25 MG TOTAL) BY MOUTH DAILY. 30 tablet 2  . Lancet Devices (ONE TOUCH DELICA LANCING DEV) MISC 1 each by Does not apply route daily. Use as instructed to check blood sugar once daily. 1 each 0  . Lancets (ONETOUCH DELICA PLUS [id number]) MISC TEST BLOOD SUGAR ONCE DAILY 100 each 2  . metoprolol succinate (TOPROL-XL) 50 MG 24 hr tablet Take 1 tablet (50 mg total) by mouth daily. Take with or immediately following a meal. 30 tablet 3  . mirabegron ER (MYRBETRIQ) 50 MG TB24 tablet Take by mouth.    . nitroGLYCERIN (NITROSTAT) 0.4 MG SL tablet Place 1 tablet (0.4 mg total) under the tongue every 5 (five) minutes x 3 doses as needed for chest pain. 25 tablet 12  . pentosan polysulfate (ELMIRON) 100 MG capsule TAKE 1 CAPSULE (100 MG TOTAL) BY MOUTH 3 TIMES DAILY BEFORE MEALS.    Marland Kitchen pravastatin (PRAVACHOL) 20 MG tablet TAKE ONE TABLET BY MOUTH ONCE DAILY 90 tablet 1  . repaglinide  (PRANDIN) 1 MG tablet Take 1 mg by mouth 2 (two) times daily.    Marland Kitchen SYNTHROID 25 MCG tablet Take 1 tablet (25 mcg total) by mouth daily before breakfast. 30 tablet 1  . Teriparatide, Recombinant, (FORTEO) 620 MCG/2.48ML SOPN Inject 20 mcg into the skin daily. Discard after 28 days. 1 pen 5  . triamcinolone ointment (KENALOG) 0.1 % APPLY THIN LAYER ON THE SKIN TWICE A DAY FOR 3 WEEKS  0   No current facility-administered medications for this visit.   Facility-Administered Medications Ordered in Other Visits  Medication Dose Route Frequency Provider Last Rate Last Admin  . cyanocobalamin ((VITAMIN B-12)) injection 1,000 mcg  1,000 mcg Intramuscular Once Alvy Bimler, Demecia Northway, MD      . Darbepoetin Alfa (ARANESP) injection 200 mcg  200 mcg Subcutaneous Once Alvy Bimler, Darene Nappi, MD         REVIEW OF SYSTEMS:   Constitutional: Denies fevers, chills or night sweats Eyes: Denies  blurriness of vision Ears, nose, mouth, throat, and face: Denies mucositis or sore throat Respiratory: Denies cough, dyspnea or wheezes Cardiovascular: Denies palpitation, chest discomfort or lower extremity swelling Gastrointestinal:  Denies nausea, heartburn or change in bowel habits Skin: Denies abnormal skin rashes Lymphatics: Denies new lymphadenopathy or easy bruising Neurological:Denies numbness, tingling or new weaknesses Behavioral/Psych: Mood is stable, no new changes  All other systems were reviewed with the patient and are negative.  PHYSICAL EXAMINATION: ECOG PERFORMANCE STATUS: 0 - Asymptomatic  Vitals:   11/02/19 1407  BP: (!) 143/52  Pulse: (!) 58  Resp: 18  Temp: 97.8 F (36.6 C)  SpO2: 100%   Filed Weights   11/02/19 1407  Weight: 123 lb 6.4 oz (56 kg)    GENERAL:alert, no distress and comfortable NEURO: alert & oriented x 3 with fluent speech, no focal motor/sensory deficits  LABORATORY DATA:  I have reviewed the data as listed     Component Value Date/Time   NA 138 09/20/2019 1506   NA 138  12/12/2016 1150   K 4.4 09/20/2019 1506   K 4.6 12/12/2016 1150   CL 100 09/20/2019 1506   CL 103 07/27/2012 1259   CO2 29 09/20/2019 1506   CO2 31 (H) 12/12/2016 1150   GLUCOSE 98 09/20/2019 1506   GLUCOSE 149 (H) 12/12/2016 1150   GLUCOSE 118 (H) 07/27/2012 1259   BUN 32 (H) 09/20/2019 1506   BUN 23.4 12/12/2016 1150   CREATININE 2.18 (H) 09/20/2019 1506   CREATININE 1.7 (H) 12/12/2016 1150   CALCIUM 9.9 09/20/2019 1506   CALCIUM 11.4 (H) 12/12/2016 1150   PROT 6.0 (L) 09/20/2019 1506   PROT 6.0 (L) 09/20/2019 1506   PROT 6.5 12/12/2016 1150   ALBUMIN 4.0 04/29/2019 1451   ALBUMIN 3.9 12/12/2016 1150   AST 16 09/20/2019 1506   AST 19 12/12/2016 1150   ALT 6 09/20/2019 1506   ALT 10 12/12/2016 1150   ALKPHOS 68 04/29/2019 1451   ALKPHOS 65 12/12/2016 1150   BILITOT 0.5 09/20/2019 1506   BILITOT 0.36 12/12/2016 1150   GFRNONAA 21 (L) 09/20/2019 1506   GFRAA 25 (L) 09/20/2019 1506    No results found for: SPEP, UPEP  Lab Results  Component Value Date   WBC 6.0 11/02/2019   NEUTROABS 2.8 11/02/2019   HGB 8.8 (L) 11/02/2019   HCT 29.4 (L) 11/02/2019   MCV 85.5 11/02/2019   PLT 197 11/02/2019      Chemistry      Component Value Date/Time   NA 138 09/20/2019 1506   NA 138 12/12/2016 1150   K 4.4 09/20/2019 1506   K 4.6 12/12/2016 1150   CL 100 09/20/2019 1506   CL 103 07/27/2012 1259   CO2 29 09/20/2019 1506   CO2 31 (H) 12/12/2016 1150   BUN 32 (H) 09/20/2019 1506   BUN 23.4 12/12/2016 1150   CREATININE 2.18 (H) 09/20/2019 1506   CREATININE 1.7 (H) 12/12/2016 1150      Component Value Date/Time   CALCIUM 9.9 09/20/2019 1506   CALCIUM 11.4 (H) 12/12/2016 1150   ALKPHOS 68 04/29/2019 1451   ALKPHOS 65 12/12/2016 1150   AST 16 09/20/2019 1506   AST 19 12/12/2016 1150   ALT 6 09/20/2019 1506   ALT 10 12/12/2016 1150   BILITOT 0.5 09/20/2019 1506   BILITOT 0.36 12/12/2016 1150

## 2019-11-03 DIAGNOSIS — N1832 Chronic kidney disease, stage 3b: Secondary | ICD-10-CM | POA: Diagnosis not present

## 2019-11-09 ENCOUNTER — Other Ambulatory Visit: Payer: Self-pay

## 2019-11-09 ENCOUNTER — Inpatient Hospital Stay: Payer: Medicare Other

## 2019-11-09 VITALS — BP 135/45 | HR 55 | Temp 97.7°F | Resp 18

## 2019-11-09 DIAGNOSIS — K909 Intestinal malabsorption, unspecified: Secondary | ICD-10-CM | POA: Diagnosis not present

## 2019-11-09 DIAGNOSIS — N183 Chronic kidney disease, stage 3 unspecified: Secondary | ICD-10-CM | POA: Diagnosis not present

## 2019-11-09 DIAGNOSIS — D631 Anemia in chronic kidney disease: Secondary | ICD-10-CM | POA: Diagnosis not present

## 2019-11-09 DIAGNOSIS — Z803 Family history of malignant neoplasm of breast: Secondary | ICD-10-CM | POA: Diagnosis not present

## 2019-11-09 DIAGNOSIS — Z794 Long term (current) use of insulin: Secondary | ICD-10-CM | POA: Diagnosis not present

## 2019-11-09 DIAGNOSIS — E538 Deficiency of other specified B group vitamins: Secondary | ICD-10-CM

## 2019-11-09 DIAGNOSIS — E611 Iron deficiency: Secondary | ICD-10-CM | POA: Diagnosis not present

## 2019-11-09 DIAGNOSIS — Z79899 Other long term (current) drug therapy: Secondary | ICD-10-CM | POA: Diagnosis not present

## 2019-11-09 DIAGNOSIS — N189 Chronic kidney disease, unspecified: Secondary | ICD-10-CM

## 2019-11-09 DIAGNOSIS — E1122 Type 2 diabetes mellitus with diabetic chronic kidney disease: Secondary | ICD-10-CM | POA: Diagnosis not present

## 2019-11-09 MED ORDER — SODIUM CHLORIDE 0.9 % IV SOLN
510.0000 mg | Freq: Once | INTRAVENOUS | Status: AC
  Administered 2019-11-09: 510 mg via INTRAVENOUS
  Filled 2019-11-09: qty 510

## 2019-11-09 MED ORDER — SODIUM CHLORIDE 0.9 % IV SOLN
Freq: Once | INTRAVENOUS | Status: AC
  Filled 2019-11-09: qty 250

## 2019-11-09 NOTE — Patient Instructions (Signed)

## 2019-11-16 ENCOUNTER — Inpatient Hospital Stay: Payer: Medicare Other

## 2019-11-17 ENCOUNTER — Ambulatory Visit: Payer: Medicare Other

## 2019-11-17 DIAGNOSIS — D631 Anemia in chronic kidney disease: Secondary | ICD-10-CM | POA: Diagnosis not present

## 2019-11-17 DIAGNOSIS — N1832 Chronic kidney disease, stage 3b: Secondary | ICD-10-CM | POA: Diagnosis not present

## 2019-11-17 DIAGNOSIS — N2581 Secondary hyperparathyroidism of renal origin: Secondary | ICD-10-CM | POA: Diagnosis not present

## 2019-11-17 DIAGNOSIS — I129 Hypertensive chronic kidney disease with stage 1 through stage 4 chronic kidney disease, or unspecified chronic kidney disease: Secondary | ICD-10-CM | POA: Diagnosis not present

## 2019-11-18 ENCOUNTER — Inpatient Hospital Stay: Payer: Medicare Other

## 2019-11-18 ENCOUNTER — Other Ambulatory Visit: Payer: Self-pay

## 2019-11-18 VITALS — BP 134/60 | HR 88 | Temp 98.2°F | Resp 18

## 2019-11-18 DIAGNOSIS — N183 Chronic kidney disease, stage 3 unspecified: Secondary | ICD-10-CM | POA: Diagnosis not present

## 2019-11-18 DIAGNOSIS — Z794 Long term (current) use of insulin: Secondary | ICD-10-CM | POA: Diagnosis not present

## 2019-11-18 DIAGNOSIS — Z79899 Other long term (current) drug therapy: Secondary | ICD-10-CM | POA: Diagnosis not present

## 2019-11-18 DIAGNOSIS — E611 Iron deficiency: Secondary | ICD-10-CM | POA: Diagnosis not present

## 2019-11-18 DIAGNOSIS — D631 Anemia in chronic kidney disease: Secondary | ICD-10-CM

## 2019-11-18 DIAGNOSIS — E538 Deficiency of other specified B group vitamins: Secondary | ICD-10-CM | POA: Diagnosis not present

## 2019-11-18 DIAGNOSIS — K909 Intestinal malabsorption, unspecified: Secondary | ICD-10-CM | POA: Diagnosis not present

## 2019-11-18 DIAGNOSIS — Z803 Family history of malignant neoplasm of breast: Secondary | ICD-10-CM | POA: Diagnosis not present

## 2019-11-18 DIAGNOSIS — E1122 Type 2 diabetes mellitus with diabetic chronic kidney disease: Secondary | ICD-10-CM | POA: Diagnosis not present

## 2019-11-18 MED ORDER — SODIUM CHLORIDE 0.9 % IV SOLN
Freq: Once | INTRAVENOUS | Status: AC
  Filled 2019-11-18: qty 250

## 2019-11-18 MED ORDER — SODIUM CHLORIDE 0.9 % IV SOLN
510.0000 mg | Freq: Once | INTRAVENOUS | Status: AC
  Administered 2019-11-18: 510 mg via INTRAVENOUS
  Filled 2019-11-18: qty 510

## 2019-11-18 NOTE — Patient Instructions (Signed)

## 2019-11-19 ENCOUNTER — Ambulatory Visit: Payer: Medicare Other | Admitting: Podiatry

## 2019-11-19 DIAGNOSIS — M79675 Pain in left toe(s): Secondary | ICD-10-CM | POA: Diagnosis not present

## 2019-11-19 DIAGNOSIS — M79674 Pain in right toe(s): Secondary | ICD-10-CM | POA: Diagnosis not present

## 2019-11-19 DIAGNOSIS — B351 Tinea unguium: Secondary | ICD-10-CM | POA: Diagnosis not present

## 2019-11-19 DIAGNOSIS — E1142 Type 2 diabetes mellitus with diabetic polyneuropathy: Secondary | ICD-10-CM

## 2019-11-21 ENCOUNTER — Encounter: Payer: Self-pay | Admitting: Podiatry

## 2019-11-21 NOTE — Progress Notes (Signed)
Subjective: Abigail Hoover presents today at risk foot care. Pt has h/o NIDDM with chronic kidney disease and painful mycotic nails b/l that are difficult to trim. Pain interferes with ambulation. Aggravating factors include wearing enclosed shoe gear. Pain is relieved with periodic professional debridement.  She notes no new pedal concerns on today's visit.  Past Medical History:  Diagnosis Date  . 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 Anxiety   . Arthritis   . Bladder irritation   . Diabetes mellitus   . GERD (gastroesophageal reflux disease)   . Hypercholesterolemia   . Hypertension   . Hypothyroidism   . Iron deficiency anemia, unspecified   . Psoriasis   . Transfusion history 03/05/2016   for postoperative (ORIF) anemia superimposed on chronic anemia     Current Outpatient Medications on File Prior to Visit  Medication Sig Dispense Refill  . phenazopyridine (PYRIDIUM) 100 MG tablet 1 po qd prn burning    . aspirin EC 81 MG tablet Take 81 mg by mouth daily. Take 1 tablet by mouth once daily.    Marland Kitchen CARAFATE 1 GM/10ML suspension TAKE 10ML BY MOUTH 4 (FOUR) TIMES A DAY BETWEEN MEALS AND AT BEDTIME    . colchicine 0.6 MG tablet TAKE ONE-HALF TABLETS (0.3 MG TOTAL) BY MOUTH DAILY. 15 tablet 2  . diazepam (VALIUM) 5 MG tablet diazepam 5 mg tablet  TAKE 1 TABLET (5 MG TOTAL) BY MOUTH ONCE FOR 1 DOSE. PRIOR TO MRI    . furosemide (LASIX) 40 MG tablet Take 40 mg by mouth. Take 1 tablet by mouth three times per week.    . gabapentin (NEURONTIN) 300 MG capsule Take by mouth.    Marland Kitchen glucose blood (ONETOUCH VERIO) test strip Use Onetouch verio test strips to check blood sugar three times daily. (90 day RX) 300 each 8  . HYDROcodone-acetaminophen (NORCO/VICODIN) 5-325 MG tablet Take 1 tablet by mouth every 6 (six) hours as needed for up to 10 doses for moderate pain. 10 tablet 0  . hydrocortisone 2.5 % ointment APPLY TO AFFECED ARES(S) TOPICALLY TWICE DAILY AS NEEDED  3  . insulin aspart  (NOVOLOG FLEXPEN) 100 UNIT/ML FlexPen INJECT 12 UNITS UNDER THE SKIN BEFORE BREAKFAST, 4 TO 5 UNITS BEFORE LUNCH, AND 12 UNITS BEFORE DINNER. 15 mL 2  . Insulin Pen Needle (PEN NEEDLES 3/16") 31G X 5 MM MISC Use 1 pen needle to inject Forteo daily. 100 each 2  . Insulin Pen Needle 31G X 5 MM MISC Use with Novolog 100 each 1  . Insulin Syringe-Needle U-100 25G X 5/8" 1 ML MISC USE DAILY TO INJECT INSULIN 100 each 3  . isosorbide mononitrate (IMDUR) 120 MG 24 hr tablet Take 120 mg by mouth daily. Take 1 tablet by mouth once daily.    Marland Kitchen JANUVIA 25 MG tablet TAKE 1 TABLET (25 MG TOTAL) BY MOUTH DAILY. 30 tablet 2  . Lancet Devices (ONE TOUCH DELICA LANCING DEV) MISC 1 each by Does not apply route daily. Use as instructed to check blood sugar once daily. 1 each 0  . Lancets (ONETOUCH DELICA PLUS [id number]) MISC TEST BLOOD SUGAR ONCE DAILY 100 each 2  . metoprolol succinate (TOPROL-XL) 50 MG 24 hr tablet Take 1 tablet (50 mg total) by mouth daily. Take with or immediately following a meal. 30 tablet 3  . mirabegron ER (MYRBETRIQ) 50 MG TB24 tablet Take by mouth.    . nitroGLYCERIN (NITROSTAT) 0.4 MG SL tablet Place 1 tablet (0.4 mg total) under  the tongue every 5 (five) minutes x 3 doses as needed for chest pain. 25 tablet 12  . pantoprazole (PROTONIX) 40 MG tablet Take 40 mg by mouth daily.    . pentosan polysulfate (ELMIRON) 100 MG capsule TAKE 1 CAPSULE (100 MG TOTAL) BY MOUTH 3 TIMES DAILY BEFORE MEALS.    Marland Kitchen pravastatin (PRAVACHOL) 20 MG tablet TAKE ONE TABLET BY MOUTH ONCE DAILY 90 tablet 1  . repaglinide (PRANDIN) 1 MG tablet Take 1 mg by mouth 2 (two) times daily.    Marland Kitchen SYNTHROID 25 MCG tablet Take 1 tablet (25 mcg total) by mouth daily before breakfast. 30 tablet 1  . Teriparatide, Recombinant, (FORTEO) 620 MCG/2.48ML SOPN Inject 20 mcg into the skin daily. Discard after 28 days. 1 pen 5  . triamcinolone ointment (KENALOG) 0.1 % APPLY THIN LAYER ON THE SKIN TWICE A DAY FOR 3 WEEKS  0   No current  facility-administered medications on file prior to visit.     Allergies  Allergen Reactions  . Atorvastatin Rash  . Ezetimibe Itching    Objective: Abigail Hoover is a pleasant 74 y.o. female in NAD. AAO x 3.  There were no vitals filed for this visit.  Vascular Examination: Neurovascular status unchanged b/l lower extremities. Capillary refill time to digits immediate b/l. Palpable DP pulses b/l. Palpable PT pulses b/l. Pedal hair sparse b/l. Skin temperature gradient within normal limits b/l. No edema noted b/l.  Dermatological Examination: Pedal skin with normal turgor, texture and tone bilaterally. No open wounds bilaterally. No interdigital macerations bilaterally. Toenails 1-5 b/l elongated, discolored, dystrophic, thickened, crumbly with subungual debris and tenderness to dorsal palpation.  Musculoskeletal: Normal muscle strength 5/5 to all lower extremity muscle groups bilaterally. No pain crepitus or joint limitation noted with ROM b/l. No gross bony deformities bilaterally. Patient ambulates independent of any assistive aids.  Neurological Examination: Protective sensation intact 5/5 intact bilaterally with 10g monofilament b/l. Vibratory sensation intact b/l. Proprioception intact bilaterally.  Assessment: 1. Pain due to onychomycosis of toenails of both feet   2. Diabetic peripheral neuropathy associated with type 2 diabetes mellitus ([place])    Plan: -Examined patient. -No new findings. No new orders. -Continue diabetic foot care principles. Literature dispensed on today.  -Toenails 1-5 b/l were debrided in length and girth with sterile nail nippers and dremel without iatrogenic bleeding.  -Patient to continue soft, supportive shoe gear daily. -Patient to report any pedal injuries to medical professional immediately. -Patient/POA to call should there be question/concern in the interim.  Return in about 3 months (around 02/19/2020) for diabetic nail trim.  Marzetta Board, DPM

## 2019-11-25 ENCOUNTER — Other Ambulatory Visit: Payer: Self-pay | Admitting: Endocrinology

## 2019-11-25 MED FILL — FORTEO 600 MCG/2.4 ML PEN I: 620 | 28 days supply | Qty: 2 | Fill #2

## 2019-12-01 ENCOUNTER — Other Ambulatory Visit: Payer: Self-pay

## 2019-12-01 MED ORDER — SITAGLIPTIN PHOSPHATE 25 MG PO TABS: ORAL_TABLET | ORAL | 1 refills | Status: DC

## 2019-12-03 ENCOUNTER — Inpatient Hospital Stay: Payer: Medicare Other | Attending: Hematology and Oncology

## 2019-12-03 ENCOUNTER — Other Ambulatory Visit: Payer: Self-pay

## 2019-12-03 ENCOUNTER — Inpatient Hospital Stay: Payer: Medicare Other

## 2019-12-03 DIAGNOSIS — N183 Chronic kidney disease, stage 3 unspecified: Secondary | ICD-10-CM | POA: Insufficient documentation

## 2019-12-03 DIAGNOSIS — D631 Anemia in chronic kidney disease: Secondary | ICD-10-CM | POA: Diagnosis not present

## 2019-12-03 LAB — CBC WITH DIFFERENTIAL/PLATELET
Abs Immature Granulocytes: 0.01 10*3/uL (ref 0.00–0.07)
Basophils Absolute: 0 10*3/uL (ref 0.0–0.1)
Basophils Relative: 1 %
Eosinophils Absolute: 0.6 10*3/uL — ABNORMAL HIGH (ref 0.0–0.5)
Eosinophils Relative: 10 %
HCT: 37.6 % (ref 36.0–46.0)
Hemoglobin: 11.4 g/dL — ABNORMAL LOW (ref 12.0–15.0)
Immature Granulocytes: 0 %
Lymphocytes Relative: 34 %
Lymphs Abs: 2.1 10*3/uL (ref 0.7–4.0)
MCH: 26.2 pg (ref 26.0–34.0)
MCHC: 30.3 g/dL (ref 30.0–36.0)
MCV: 86.4 fL (ref 80.0–100.0)
Monocytes Absolute: 0.5 10*3/uL (ref 0.1–1.0)
Monocytes Relative: 8 %
Neutro Abs: 3 10*3/uL (ref 1.7–7.7)
Neutrophils Relative %: 47 %
Platelets: 210 10*3/uL (ref 150–400)
RBC: 4.35 MIL/uL (ref 3.87–5.11)
RDW: 16 % — ABNORMAL HIGH (ref 11.5–15.5)
WBC: 6.3 10*3/uL (ref 4.0–10.5)
nRBC: 0 % (ref 0.0–0.2)

## 2019-12-03 NOTE — Progress Notes (Signed)
Patients HGB is 11.4 today no injection neeeded at this time. Copy of labs given to patient

## 2019-12-20 ENCOUNTER — Other Ambulatory Visit: Payer: Self-pay | Admitting: Endocrinology

## 2019-12-20 MED FILL — FORTEO 600 MCG/2.4 ML PEN I: 620 | 28 days supply | Qty: 2 | Fill #3

## 2019-12-21 ENCOUNTER — Telehealth: Payer: Self-pay | Admitting: Pharmacy Technician

## 2019-12-21 NOTE — Telephone Encounter (Signed)
Patient discussed possibly applying for patient assistance for Forteo with pharmacy. Called patient, she would like to have application mailed. Patient expressed that she still does not think she would qualify due to income. Advised patient she can still try to apply.  Mailed [employer] application to patient.

## 2019-12-24 ENCOUNTER — Encounter: Payer: Self-pay | Admitting: Internal Medicine

## 2019-12-27 ENCOUNTER — Other Ambulatory Visit: Payer: Self-pay | Admitting: Endocrinology

## 2019-12-27 NOTE — Telephone Encounter (Signed)
For renewal

## 2019-12-28 ENCOUNTER — Encounter: Payer: Self-pay | Admitting: Internal Medicine

## 2019-12-28 ENCOUNTER — Other Ambulatory Visit: Payer: Self-pay | Admitting: Internal Medicine

## 2019-12-28 ENCOUNTER — Ambulatory Visit: Payer: Medicare Other | Admitting: Internal Medicine

## 2019-12-28 ENCOUNTER — Other Ambulatory Visit: Payer: Self-pay

## 2019-12-28 ENCOUNTER — Telehealth: Payer: Self-pay

## 2019-12-28 VITALS — BP 120/60 | HR 60 | Ht 61.0 in | Wt 122.0 lb

## 2019-12-28 DIAGNOSIS — E1122 Type 2 diabetes mellitus with diabetic chronic kidney disease: Secondary | ICD-10-CM | POA: Diagnosis not present

## 2019-12-28 DIAGNOSIS — E039 Hypothyroidism, unspecified: Secondary | ICD-10-CM

## 2019-12-28 DIAGNOSIS — N184 Chronic kidney disease, stage 4 (severe): Secondary | ICD-10-CM

## 2019-12-28 DIAGNOSIS — Z794 Long term (current) use of insulin: Secondary | ICD-10-CM

## 2019-12-28 LAB — POCT GLYCOSYLATED HEMOGLOBIN (HGB A1C): Hemoglobin A1C: 6.3 % — AB (ref 4.0–5.6)

## 2019-12-28 LAB — TSH: TSH: 0.57 u[IU]/mL (ref 0.35–4.50)

## 2019-12-28 LAB — T4, FREE: Free T4: 1.48 ng/dL (ref 0.60–1.60)

## 2019-12-28 MED ORDER — OZEMPIC (0.25 OR 0.5 MG/DOSE) 2 MG/1.5ML ~~LOC~~ SOPN: 0.5000 mg | PEN_INJECTOR | SUBCUTANEOUS | 3 refills | Status: DC

## 2019-12-28 MED ORDER — NOVOLOG FLEXPEN 100 UNIT/ML ~~LOC~~ SOPN: 4.0000 [IU] | PEN_INJECTOR | Freq: Two times a day (BID) | SUBCUTANEOUS | 3 refills | Status: DC

## 2019-12-28 NOTE — Patient Instructions (Addendum)
Please start: - Ozempic 0.25 mg weekly in a.m. (for example on Sunday morning) x 4 weeks, then increase to 0.5 mg weekly in a.m. if no nausea or hypoglycemia.  Please reduce: - Novolog 3x a day, before meals:  4-8 units before breakfast 4-8 units before dinner  Stop Januvia. _____________________________________________________________________________________ If you cannot get Ozempic, then:  Stop Januvia.  Change Novolog: - 9-11 units before breakfast - 7-9 units before dinner _____________________________________________________________________________________  Please let me know if the sugars are consistently <80 or >200.  Please continue Unithroid 75 mcg daily: take this every day, with water, at least 30 minutes before breakfast, separated by at least 4 hours from: - acid reflux medications - calcium - iron - multivitamins  Please stop at the lab.  Please return in 3 months with your sugar log.   PATIENT INSTRUCTIONS FOR TYPE 2 DIABETES:  DIET AND EXERCISE Diet and exercise is an important part of diabetic treatment.  We recommended aerobic exercise in the form of brisk walking (working between 40-60% of maximal aerobic capacity, similar to brisk walking) for 150 minutes per week (such as 30 minutes five days per week) along with 3 times per week performing 'resistance' training (using various gauge rubber tubes with handles) 5-10 exercises involving the major muscle groups (upper body, lower body and core) performing 10-15 repetitions (or near fatigue) each exercise. Start at half the above goal but build slowly to reach the above goals. If limited by weight, joint pain, or disability, we recommend daily walking in a swimming pool with water up to waist to reduce pressure from joints while allow for adequate exercise.    BLOOD GLUCOSES Monitoring your blood glucoses is important for continued management of your diabetes. Please check your blood glucoses 2-4 times a day:  fasting, before meals and at bedtime (you can rotate these measurements - e.g. one day check before the 3 meals, the next day check before 2 of the meals and before bedtime, etc.).   HYPOGLYCEMIA (low blood sugar) Hypoglycemia is usually a reaction to not eating, exercising, or taking too much insulin/ other diabetes drugs.  Symptoms include tremors, sweating, hunger, confusion, headache, etc. Treat IMMEDIATELY with 15 grams of Carbs: . 4 glucose tablets .  cup regular juice/soda . 2 tablespoons raisins . 4 teaspoons sugar . 1 tablespoon honey Recheck blood glucose in 15 mins and repeat above if still symptomatic/blood glucose <100.  RECOMMENDATIONS TO REDUCE YOUR RISK OF DIABETIC COMPLICATIONS: * Take your prescribed MEDICATION(S) * Follow a DIABETIC diet: Complex carbs, fiber rich foods, (monounsaturated and polyunsaturated) fats * AVOID saturated/trans fats, high fat foods, >2,300 mg salt per day. * EXERCISE at least 5 times a week for 30 minutes or preferably daily.  * DO NOT SMOKE OR DRINK more than 1 drink a day. * Check your FEET every day. Do not wear tightfitting shoes. Contact us if you develop an ulcer * See your EYE doctor once a year or more if needed * Get a FLU shot once a year * Get a PNEUMONIA vaccine once before and once after age 75 years  GOALS:  * Your Hemoglobin A1c of <7%  * fasting sugars need to be <130 * after meals sugars need to be <180 (2h after you start eating) * Your Systolic BP should be 093 or lower  * Your Diastolic BP should be 80 or lower  * Your HDL (Good Cholesterol) should be 40 or higher  * Your LDL (Bad Cholesterol) should be  100 or lower. * Your Triglycerides should be 150 or lower  * Your Urine microalbumin (kidney function) should be <30 * Your Body Mass Index should be 25 or lower    Please consider the following ways to cut down carbs and fat and increase fiber and micronutrients in your diet: - substitute whole grain for white  bread or pasta - substitute brown rice for white rice - substitute 90-calorie flat bread pieces for slices of bread when possible - substitute sweet potatoes or yams for white potatoes - substitute humus for margarine - substitute tofu for cheese when possible - substitute almond or rice milk for regular milk (would not drink soy milk daily due to concern for soy estrogen influence on breast cancer risk) - substitute dark chocolate for other sweets when possible - substitute water - can add lemon or orange slices for taste - for diet sodas (artificial sweeteners will trick your body that you can eat sweets without getting calories and will lead you to overeating and weight gain in the long run) - do not skip breakfast or other meals (this will slow down the metabolism and will result in more weight gain over time)  - can try smoothies made from fruit and almond/rice milk in am instead of regular breakfast - can also try old-fashioned (not instant) oatmeal made with almond/rice milk in am - order the dressing on the side when eating salad at a restaurant (pour less than half of the dressing on the salad) - eat as little meat as possible - can try juicing, but should not forget that juicing will get rid of the fiber, so would alternate with eating raw veg./fruits or drinking smoothies - use as little oil as possible, even when using olive oil - can dress a salad with a mix of balsamic vinegar and lemon juice, for e.g. - use agave nectar, stevia sugar, or regular sugar rather than artificial sweateners - steam or broil/roast veggies  - snack on veggies/fruit/nuts (unsalted, preferably) when possible, rather than processed foods - reduce or eliminate aspartame in diet (it is in diet sodas, chewing gum, etc) Read the labels!  Try to read Dr. Janene Harvey book: "Program for Reversing Diabetes" for other ideas for healthy eating.

## 2019-12-28 NOTE — Telephone Encounter (Signed)
PA for Ozempic submitted via CoverMyMeds  Your information has been submitted to [employer]. [employer] [place] will review the request and notify you of the determination decision directly, typically within 3 business days of your submission and once all necessary information is received.  You will also receive your request decision electronically. To check for an update later, open the request again from your dashboard.  If [employer] [place] has not responded within the specified timeframe or if you have any questions about your PA submission, contact [place] directly at [facility]) [phone] or ([place]) [phone].

## 2019-12-28 NOTE — Progress Notes (Signed)
Patient ID: Abigail Hoover, female   DOB: [birth year], 74 y.o.   MRN: [id number]   This visit occurred during the SARS-CoV-2 public health emergency.  Safety protocols were in place, including screening questions prior to the visit, additional usage of staff PPE, and extensive cleaning of exam room while observing appropriate contact time as indicated for disinfecting solutions.   HPI: Abigail Hoover is a pleasant 74 y.o.-year-old female, referred by her PCP, Dr. Delfina Redwood, for management of DM2, dx in her 19s, insulin-dependent since 05/2019, uncontrolled, with complications (CKD stage IV, PN) and hypothyroidism.  She previously saw Dr. Dwyane Dee in this office, last 07/2019.  DM2:  Reviewed HbA1c: Lab Results  Component Value Date   HGBA1C 7.6 (H) 07/30/2019   HGBA1C 7.7 (H) 04/29/2019   HGBA1C 6.6 (H) 01/27/2019   HGBA1C 6.9 (H) 10/30/2018   HGBA1C 6.3 03/03/2018   HGBA1C 5.7 10/21/2017   HGBA1C 6.1 05/27/2017   HGBA1C 6.6 (H) 12/20/2016   HGBA1C 5.0 10/10/2016   HGBA1C 6.9 (H) 04/18/2016   Pt is on a regimen of: - Januvia 25 mg daily in a.m. - Novolog 3x a day, before meals: 8-10 units before breakfast, 3 units before snack, 8-9 units before dinner Stopped Actos 2/2 edema. She was on Repaglinide (Prandin)   Pt checks her sugars 3x a day and they are: - am: 108-127, 202 - 2h after b'fast: 131 (after 202) - before lunch: 108-193, 212 - 2h after lunch: n/c - before dinner: 123-198, 265 - 2h after dinner: 55-196 - bedtime: n/c - nighttime: n/c Lowest sugar was 49, 55; she has hypoglycemia awareness at 60-70s.  Highest sugar was 265.  Glucometer: One Probation officer Flex  Pt's meals are vegetarian: - Breakfast: indian bread, veggies, tea - Lunch: skips - tea - Dinner: indian bread, rice, soup, veggies - Snacks: no  - + CKD - seeing Dr. Posey Pronto, last BUN/creatinine:  Lab Results  Component Value Date   BUN 32 (H) 09/20/2019   BUN 33 (H) 07/30/2019   CREATININE 2.18 (H) 09/20/2019    CREATININE 1.74 (H) 07/30/2019  Not on an ACEI/ARB.  - + HL: last set of lipids: Lab Results  Component Value Date   CHOL 155 07/30/2019   HDL 57.00 07/30/2019   LDLCALC 76 07/30/2019   TRIG 110.0 07/30/2019   CHOLHDL 3 07/30/2019  On pravastatin 20 mg daily.  She has a history of a rash with Lipitor.  - last eye exam was in 02/2019. No DR reportedly.   - +  numbness and tingling in her feet.  On gabapentin 300 mg for bladder.  On ASA 81.  Pt has FH of DM in brother, sister, mother, MGM.  Hypothyroidism:  Previously on Synthroid d.a.w. currently on Unithroid d.a.w. (she does not want to take generics).  Pt is on Unithroid 75 mcg daily: - in am - fasting - at least 30 min from b'fast - no Ca, Fe, MVI - Stopped PPIs - not on Biotin On Carafate at nighttime.  Lab Results  Component Value Date   TSH 0.20 (L) 07/30/2019   TSH 2.56 04/29/2019   TSH 1.97 01/27/2019   TSH 1.58 12/16/2018   TSH 10.00 (H) 10/30/2018   TSH 3.36 03/03/2018   TSH 1.64 10/21/2017   TSH 0.68 12/20/2016   TSH 1.63 07/26/2016   TSH 2.97 04/18/2016   TSH 0.32 (L) 01/19/2016   TSH 0.89 07/13/2015   TSH 0.97 12/07/2014   TSH 1.090 12/07/2014   TSH 2.220 04/26/2013  Pt denies: - feeling nodules in neck - hoarseness - dysphagia - choking - SOB with lying down  No FH of thyroid cancer. No h/o radiation tx to head or neck.  No herbal supplements. No Biotin use. No recent steroids use.   + OP  - On Forteo. On vitamin D 5000 units daily.  Sees Dr. Collene Mares.  ROS: Constitutional: no weight gain, no weight loss, no fatigue, no subjective hyperthermia, no subjective hypothermia, no nocturia Eyes: no blurry vision, no xerophthalmia ENT: no sore throat, + see HPI no tinnitus, no hypoacusis Cardiovascular: no CP, no SOB, no palpitations, + B leg swelling Respiratory: no cough, no SOB, no wheezing Gastrointestinal: no N, no V, no D, no C, + acid reflux Musculoskeletal: no muscle, no joint  aches Skin: no rash, + hair loss Neurological: no tremors, no numbness or tingling/no dizziness/no HAs Psychiatric: no depression, no anxiety  Past Medical History:  Diagnosis Date  . 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 Anxiety   . Arthritis   . Bladder irritation   . Diabetes mellitus   . GERD (gastroesophageal reflux disease)   . Hypercholesterolemia   . Hypertension   . Hypothyroidism   . Iron deficiency anemia, unspecified   . Psoriasis   . Transfusion history 03/05/2016   for postoperative (ORIF) anemia superimposed on chronic anemia   Past Surgical History:  Procedure Laterality Date  . ABDOMINAL HYSTERECTOMY     TAH  . BREAST BIOPSY     Left breast biopsy benign lesion  . BREAST BIOPSY  03/11/2012   Procedure: BREAST BIOPSY;  Surgeon: Odis Hollingshead, MD;  Location: [place];  Service: General;  Laterality: Left;  remove left breast mass  . BREAST EXCISIONAL BIOPSY Left   . CARDIAC CATHETERIZATION N/A 01/02/2015   Procedure: Left Heart Cath and Coronary Angiography;  Surgeon: Charolette Forward, MD;  Location: [facility];  Service: Cardiovascular;  Laterality: N/A;  . CATARACT EXTRACTION    . CERVICAL DISC SURGERY    . COLONOSCOPY  2013   neg. next 2023.   Marland Kitchen EYE SURGERY    . IR KYPHO EA ADDL LEVEL THORACIC OR LUMBAR  09/09/2019  . IR KYPHO LUMBAR INC FX REDUCE BONE BX UNI/BIL CANNULATION INC/IMAGING  09/09/2019  . IR RADIOLOGIST EVAL & MGMT  09/08/2019  . LEFT HIP SURGERY    . TOTAL HIP ARTHROPLASTY Left 03/02/2016   Procedure: TOTAL HIP ARTHROPLASTY ANTERIOR APPROACH;  Surgeon: Rod Can, MD;  Location: [facility];  Service: Orthopedics;  Laterality: Left;   Social History   Socioeconomic History  . Marital status: Married    Spouse name: Not on file  . Number of children: 1  . Years of education: college  . Highest education level: Not on file  Occupational History  . Occupation: Homemaker  Tobacco Use  . Smoking status: Never Smoker  .  Smokeless tobacco: Never Used  Vaping Use  . Vaping Use: Never used  Substance and Sexual Activity  . Alcohol use: No    Alcohol/week: 0.0 standard drinks  . Drug use: No  . Sexual activity: Not Currently  Other Topics Concern  . Not on file  Social History Narrative   Patient is married with one child.   Patient is right handed.   Patient has college education.   Patient drinks tea, two times a day   Social Determinants of Health   Financial Resource Strain:   . Difficulty of Paying Living Expenses: Not  on file  Food Insecurity:   . Worried About Charity fundraiser in the Last Year: Not on file  . Ran Out of Food in the Last Year: Not on file  Transportation Needs:   . Lack of Transportation (Medical): Not on file  . Lack of Transportation (Non-Medical): Not on file  Physical Activity:   . Days of Exercise per Week: Not on file  . Minutes of Exercise per Session: Not on file  Stress:   . Feeling of Stress : Not on file  Social Connections:   . Frequency of Communication with Friends and Family: Not on file  . Frequency of Social Gatherings with Friends and Family: Not on file  . Attends Religious Services: Not on file  . Active Member of Clubs or Organizations: Not on file  . Attends Archivist Meetings: Not on file  . Marital Status: Not on file  Intimate Partner Violence:   . Fear of Current or Ex-Partner: Not on file  . Emotionally Abused: Not on file  . Physically Abused: Not on file  . Sexually Abused: Not on file   Current Outpatient Medications on File Prior to Visit  Medication Sig Dispense Refill  . aspirin EC 81 MG tablet Take 81 mg by mouth daily. Take 1 tablet by mouth once daily.    Marland Kitchen CARAFATE 1 GM/10ML suspension TAKE 10ML BY MOUTH 4 (FOUR) TIMES A DAY BETWEEN MEALS AND AT BEDTIME    . colchicine 0.6 MG tablet TAKE ONE-HALF TABLETS (0.3 MG TOTAL) BY MOUTH DAILY. 15 tablet 2  . diazepam (VALIUM) 5 MG tablet diazepam 5 mg tablet  TAKE 1 TABLET  (5 MG TOTAL) BY MOUTH ONCE FOR 1 DOSE. PRIOR TO MRI    . furosemide (LASIX) 40 MG tablet Take 40 mg by mouth. Take 1 tablet by mouth three times per week.    . gabapentin (NEURONTIN) 300 MG capsule Take by mouth.    Marland Kitchen glucose blood (ONETOUCH VERIO) test strip Use Onetouch verio test strips to check blood sugar three times daily. (90 day RX) 300 each 8  . HYDROcodone-acetaminophen (NORCO/VICODIN) 5-325 MG tablet Take 1 tablet by mouth every 6 (six) hours as needed for up to 10 doses for moderate pain. 10 tablet 0  . hydrocortisone 2.5 % ointment APPLY TO AFFECED ARES(S) TOPICALLY TWICE DAILY AS NEEDED  3  . insulin aspart (NOVOLOG FLEXPEN) 100 UNIT/ML FlexPen INJECT 12 UNITS UNDER THE SKIN BEFORE BREAKFAST, 4 TO 5 UNITS BEFORE LUNCH, AND 12 UNITS BEFORE DINNER. (Patient taking differently: 8-9 Units. INJECT 12 UNITS UNDER THE SKIN BEFORE BREAKFAST, 4 TO 5 UNITS BEFORE LUNCH, AND 12 UNITS BEFORE DINNER.) 15 mL 2  . Insulin Pen Needle (PEN NEEDLES 3/16") 31G X 5 MM MISC Use 1 pen needle to inject Forteo daily. 100 each 2  . Insulin Pen Needle 31G X 5 MM MISC Use with Novolog 100 each 1  . Insulin Syringe-Needle U-100 25G X 5/8" 1 ML MISC USE DAILY TO INJECT INSULIN 100 each 3  . isosorbide mononitrate (IMDUR) 120 MG 24 hr tablet Take 120 mg by mouth daily. Take 1 tablet by mouth once daily.    Elmore Guise Devices (ONE TOUCH DELICA LANCING DEV) MISC 1 each by Does not apply route daily. Use as instructed to check blood sugar once daily. 1 each 0  . Lancets (ONETOUCH DELICA PLUS [id number]) MISC TEST BLOOD SUGAR ONCE DAILY 100 each 2  . metoprolol succinate (TOPROL-XL) 50 MG 24  hr tablet Take 1 tablet (50 mg total) by mouth daily. Take with or immediately following a meal. 30 tablet 3  . mirabegron ER (MYRBETRIQ) 50 MG TB24 tablet Take by mouth.    . nitroGLYCERIN (NITROSTAT) 0.4 MG SL tablet Place 1 tablet (0.4 mg total) under the tongue every 5 (five) minutes x 3 doses as needed for chest pain. 25 tablet 12   . pantoprazole (PROTONIX) 40 MG tablet Take 40 mg by mouth daily.    . pentosan polysulfate (ELMIRON) 100 MG capsule TAKE 1 CAPSULE (100 MG TOTAL) BY MOUTH 3 TIMES DAILY BEFORE MEALS.    Marland Kitchen phenazopyridine (PYRIDIUM) 100 MG tablet 1 po qd prn burning    . pravastatin (PRAVACHOL) 20 MG tablet TAKE ONE TABLET BY MOUTH ONCE DAILY 90 tablet 1  . sitaGLIPtin (JANUVIA) 25 MG tablet TAKE 1 TABLET (25 MG TOTAL) BY MOUTH DAILY. 30 tablet 1  . SYNTHROID 75 MCG tablet TAKE ONE TABLET BY MOUTH ONCE DAILY 90 tablet 0  . Teriparatide, Recombinant, (FORTEO) 620 MCG/2.48ML SOPN Inject 20 mcg into the skin daily. Discard after 28 days. 1 pen 5  . triamcinolone ointment (KENALOG) 0.1 % APPLY THIN LAYER ON THE SKIN TWICE A DAY FOR 3 WEEKS  0  . repaglinide (PRANDIN) 1 MG tablet Take 1 mg by mouth 2 (two) times daily. (Patient not taking: Reported on 12/28/2019)     No current facility-administered medications on file prior to visit.   Allergies  Allergen Reactions  . Atorvastatin Rash  . Ezetimibe Itching   Family History  Problem Relation Age of Onset  . Heart disease Mother        heart attack  . Stroke Father        brain hemorrhage  . Diabetes Brother   . Breast cancer Sister   . Cancer Sister        breast cancer    PE: BP 120/60   Pulse 60   Ht 5\' 1"  (1.549 m)   Wt 122 lb (55.3 kg)   SpO2 99%   BMI 23.05 kg/m  Wt Readings from Last 3 Encounters:  12/28/19 122 lb (55.3 kg)  11/02/19 123 lb 6.4 oz (56 kg)  10/26/19 121 lb 9.6 oz (55.2 kg)   Constitutional: normal weight, in NAD Eyes: PERRLA, EOMI, no exophthalmos ENT: moist mucous membranes, no thyromegaly, no cervical lymphadenopathy Cardiovascular: RRR, No MRG, + B LE edema - pitting Respiratory: CTA B Gastrointestinal: abdomen soft, NT, ND, BS+ Musculoskeletal: no deformities, strength intact in all 4 Skin: moist, warm, no rashes Neurological: no tremor with outstretched hands, DTR normal in all 4  ASSESSMENT: 1. DM2,  insulin-dependent, uncontrolled, with long-term complications - CKD now stage 4 - PN  No personal history of pancreatitis or family history of medullary thyroid cancer.  2.  Hypothyroidism  PLAN:  1. DM2 Patient with long-standing, uncontrolled diabetes, on oral antidiabetic regimen + mealtime insulin, which became insufficient.  Latest HbA1c from 07/2019 was higher, at 7.6%.  However, HbA1c calculated from fructosamine was better, at 6.6% at that time.  At today's visit, an HbA1c is 6.3%.  However, her sugars at home appeared to be higher than expected from his HbA1c. -At today's visit, we reviewed her regimen which is unusual: She is on a low-dose of DPP four and also twice a day mealtime NovoLog. -Reviewing her meter downloads, they are mostly at goal in the morning but they increase after breakfast.  They may remain high before dinner but  they decreased after dinner.   -Due to her significant kidney disease, we do not have many options for treatment, however, a GLP-1 receptor agonist is an option especially since reducing her cardiovascular risk is also necessary.  We discussed about using a lower dose Ozempic once a week and reducing the dose of NovoLog if we are able to start this.  I also gave her a plan for treatment in case Ozempic is not affordable, in which case, will increase slightly her NovoLog before breakfast and decrease the one before dinner.  We also discussed about using a lower dose NovoLog before a snack for for correction.  I do not feel that she would necessarily benefit from basal insulin since her higher sugars are after meals - I suggested to:  Patient Instructions  Please start: - Ozempic 0.25 mg weekly in a.m. (for example on Sunday morning) x 4 weeks, then increase to 0.5 mg weekly in a.m. if no nausea or hypoglycemia.  Please reduce: - Novolog 3x a day, before meals:  4-8 units before breakfast 4-8 units before dinner  Stop  Januvia. _____________________________________________________________________________________ If you cannot get Ozempic, then:  Stop Januvia.  Change Novolog: - 9-11 units before breakfast - 7-9 units before dinner _____________________________________________________________________________________  Please let me know if the sugars are consistently <80 or >200.  Please continue Unithroid 75 mcg daily: take this every day, with water, at least 30 minutes before breakfast, separated by at least 4 hours from: - acid reflux medications - calcium - iron - multivitamins  Please stop at the lab.  Please return in 3 months with your sugar log.   - Strongly advised her to start checking sugars at different times of the day - check 3x a day, rotating checks - discussed about CBG targets for treatment: 80-130 mg/dL before meals and <180 mg/dL after meals; target HbA1c <7%. - given sugar log and advised how to fill it and to bring it at next appt  - given foot care handout and explained the principles  - given instructions for hypoglycemia management "15-15 rule"  - advised for yearly eye exams  - Return to clinic in 3 mo with sugar log   2. Hypothyroidism - latest thyroid labs reviewed with pt >> TSH suppressed: Lab Results  Component Value Date   TSH 0.20 (L) 07/30/2019   - she is on Unithroid DAW 75 mcg daily - pt feels good on this dose. - we discussed about taking the thyroid hormone every day, with water, >30 minutes before breakfast, separated by >4 hours from acid reflux medications, calcium, iron, multivitamins. Pt. is taking it correctly. - will check thyroid tests today: TSH and fT4 - If labs are abnormal, she will need to return for repeat TFTs in 1.5 months  - Total time spent for the visit: 50 min, in obtaining medical information from the chart, reviewing her  previous labs, imaging evaluations, treatments, reviewing her symptoms, counseling her about her condition  (please see the discussed topics above), and developing a plan to further investigate and treat it; she had a number of questions which I addressed.  Office Visit on 12/28/2019  Component Date Value Ref Range Status  . TSH 12/28/2019 0.57  0.35 - 4.50 uIU/mL Final  . Free T4 12/28/2019 1.48  0.60 - 1.60 ng/dL Final   Comment: Specimens from patients who are undergoing biotin therapy and /or ingesting biotin supplements may contain high levels of biotin.  The higher biotin concentration in these specimens  interferes with this Free T4 assay.  Specimens that contain high levels  of biotin may cause false high results for this Free T4 assay.  Please interpret results in light of the total clinical presentation of the patient.    . Hemoglobin A1C 12/28/2019 6.3* 4.0 - 5.6 % Final   TFTs are normal.  We will continue the same levothyroxine dose.  Philemon Kingdom, MD PhD [facility]

## 2019-12-29 ENCOUNTER — Telehealth: Payer: Self-pay

## 2019-12-29 NOTE — Telephone Encounter (Signed)
Spoke with patient insurance and they only approve for 1 pen a month. So I contacted [facility] and advise for them to run through for 1 pen.

## 2019-12-29 NOTE — Telephone Encounter (Signed)
Patient called stating her back is still painful and requesting a return call from the nurse before scheduling an appointment.

## 2019-12-29 NOTE — Telephone Encounter (Signed)
Spoke with patient, she states she is having back pain. Patient is asking about a possible brace that may help. Patient states she has not tried any medication to see if it helps with the pain. Patient states she has not tried heat or ice to help with the pain. Patient would like to try a brace first before anything else. Please advise.

## 2019-12-29 NOTE — Telephone Encounter (Signed)
[place] with [employer] called stating she needed to clarify quantity that's being requested for patient's Ozempic? Would like a call back at [phone] option 5. (quanity limit for this is 1 pen - 1.5 ML for 30 days)

## 2019-12-29 NOTE — Telephone Encounter (Signed)
Okay to give a prescription for back brace.  She should use the brace only while she is doing activities.  Otherwise it would weaken her back muscles.  She will also benefit from physical therapy.  If she is interested we can give her a prescription.

## 2019-12-30 NOTE — Telephone Encounter (Signed)
Spoke with patient and advised we could give a prescription for back brace.  Patient advised she should use the brace only while she is doing activities.  Otherwise it would weaken her back muscles.  Patient advised she will also benefit from physical therapy.  Patient advised if she is interested we can give her a prescription. Patient declined prescription for physical therapy. Prescription for back brace mailed to patient per her request.

## 2020-01-03 ENCOUNTER — Inpatient Hospital Stay: Payer: Medicare Other

## 2020-01-06 ENCOUNTER — Telehealth: Payer: Self-pay | Admitting: Internal Medicine

## 2020-01-06 ENCOUNTER — Other Ambulatory Visit: Payer: Self-pay

## 2020-01-06 ENCOUNTER — Inpatient Hospital Stay: Payer: Medicare Other | Attending: Hematology and Oncology

## 2020-01-06 ENCOUNTER — Inpatient Hospital Stay: Payer: Medicare Other

## 2020-01-06 VITALS — BP 146/54 | HR 57 | Resp 18

## 2020-01-06 DIAGNOSIS — N183 Chronic kidney disease, stage 3 unspecified: Secondary | ICD-10-CM

## 2020-01-06 DIAGNOSIS — E538 Deficiency of other specified B group vitamins: Secondary | ICD-10-CM | POA: Diagnosis not present

## 2020-01-06 DIAGNOSIS — D631 Anemia in chronic kidney disease: Secondary | ICD-10-CM

## 2020-01-06 DIAGNOSIS — N189 Chronic kidney disease, unspecified: Secondary | ICD-10-CM

## 2020-01-06 LAB — CBC WITH DIFFERENTIAL/PLATELET
Abs Immature Granulocytes: 0.02 10*3/uL (ref 0.00–0.07)
Basophils Absolute: 0 10*3/uL (ref 0.0–0.1)
Basophils Relative: 0 %
Eosinophils Absolute: 0.9 10*3/uL — ABNORMAL HIGH (ref 0.0–0.5)
Eosinophils Relative: 14 %
HCT: 35.8 % — ABNORMAL LOW (ref 36.0–46.0)
Hemoglobin: 10.8 g/dL — ABNORMAL LOW (ref 12.0–15.0)
Immature Granulocytes: 0 %
Lymphocytes Relative: 28 %
Lymphs Abs: 1.8 10*3/uL (ref 0.7–4.0)
MCH: 25.8 pg — ABNORMAL LOW (ref 26.0–34.0)
MCHC: 30.2 g/dL (ref 30.0–36.0)
MCV: 85.6 fL (ref 80.0–100.0)
Monocytes Absolute: 0.4 10*3/uL (ref 0.1–1.0)
Monocytes Relative: 7 %
Neutro Abs: 3.2 10*3/uL (ref 1.7–7.7)
Neutrophils Relative %: 51 %
Platelets: 191 10*3/uL (ref 150–400)
RBC: 4.18 MIL/uL (ref 3.87–5.11)
RDW: 14.7 % (ref 11.5–15.5)
WBC: 6.4 10*3/uL (ref 4.0–10.5)
nRBC: 0 % (ref 0.0–0.2)

## 2020-01-06 MED ORDER — DARBEPOETIN ALFA 200 MCG/0.4ML IJ SOSY
PREFILLED_SYRINGE | INTRAMUSCULAR | Status: AC
  Filled 2020-01-06: qty 0.4

## 2020-01-06 MED ORDER — CYANOCOBALAMIN 1000 MCG/ML IJ SOLN
INTRAMUSCULAR | Status: AC
  Filled 2020-01-06: qty 1

## 2020-01-06 MED ORDER — CYANOCOBALAMIN 1000 MCG/ML IJ SOLN
1000.0000 ug | Freq: Once | INTRAMUSCULAR | Status: AC
  Administered 2020-01-06: 1000 ug via INTRAMUSCULAR

## 2020-01-06 MED ORDER — DARBEPOETIN ALFA 200 MCG/0.4ML IJ SOSY
200.0000 ug | PREFILLED_SYRINGE | Freq: Once | INTRAMUSCULAR | Status: AC
  Administered 2020-01-06: 200 ug via SUBCUTANEOUS

## 2020-01-06 MED FILL — UNIFINE PENTIPS 31GX3/16: 31G X 5 MM | 90 days supply | Qty: 100 | Fill #1

## 2020-01-06 NOTE — Telephone Encounter (Signed)
Patient called to ask where she should be injecting the Ozempic she was prescribed.  Call back number is [phone]

## 2020-01-07 NOTE — Telephone Encounter (Signed)
Patient has been giving directions on the Ozempic

## 2020-01-07 NOTE — Telephone Encounter (Signed)
Patient requests to be called at ph# [phone] re: instruction for taking Ozempic

## 2020-01-10 ENCOUNTER — Telehealth: Payer: Self-pay | Admitting: Rheumatology

## 2020-01-10 ENCOUNTER — Telehealth: Payer: Self-pay

## 2020-01-10 NOTE — Telephone Encounter (Signed)
Spoke with patient and advised it is [facility]. Provided patient with address.

## 2020-01-10 NOTE — Telephone Encounter (Signed)
Patient wants to know where to go to get the back brace doctor wrote an rx for. Please call to advise. Please call after 1:30 pm.

## 2020-01-10 NOTE — Telephone Encounter (Signed)
Patient left a voicemail requesting a return call with the name and address of the shop where she can pick up the belt for her back.

## 2020-01-10 NOTE — Telephone Encounter (Signed)
See previous phone note.  

## 2020-01-11 ENCOUNTER — Telehealth: Payer: Self-pay | Admitting: Neurology

## 2020-01-11 NOTE — Telephone Encounter (Signed)
Pt called, headache worsening patient was scheduled an appt. Pt would like to be seen before scheduled appt.. Pt have been added to the waitlist.

## 2020-01-11 NOTE — Telephone Encounter (Signed)
Per review of prior office visit with Dr. Leonie Man, he felt headache/neck tension likely secondary to musculoskeletal etiology.  He recommended doing neck exercises and stretches for benefit.  I would recommend she be evaluated further by her PCP for this continued concern as it was not felt to be neurologically related.

## 2020-01-11 NOTE — Telephone Encounter (Signed)
I called pt and after some discourse was able to convey that per Dr. Leonie Man and Janett Billow NP that due to her headaches most likely due to muscle tension that she is to see pcp for evaluation.  She did eventually say she understood and will see her family MD.

## 2020-01-12 MED FILL — FORTEO 600 MCG/2.4 ML PEN I: 620 | 28 days supply | Qty: 2 | Fill #4

## 2020-01-26 DIAGNOSIS — I872 Venous insufficiency (chronic) (peripheral): Secondary | ICD-10-CM | POA: Diagnosis not present

## 2020-01-26 DIAGNOSIS — I25111 Atherosclerotic heart disease of native coronary artery with angina pectoris with documented spasm: Secondary | ICD-10-CM | POA: Diagnosis not present

## 2020-01-26 DIAGNOSIS — I10 Essential (primary) hypertension: Secondary | ICD-10-CM | POA: Diagnosis not present

## 2020-01-26 DIAGNOSIS — E119 Type 2 diabetes mellitus without complications: Secondary | ICD-10-CM | POA: Diagnosis not present

## 2020-02-03 ENCOUNTER — Other Ambulatory Visit: Payer: Self-pay | Admitting: Rheumatology

## 2020-02-03 ENCOUNTER — Other Ambulatory Visit: Payer: Self-pay

## 2020-02-03 ENCOUNTER — Inpatient Hospital Stay: Payer: Medicare Other | Attending: Hematology and Oncology

## 2020-02-03 ENCOUNTER — Inpatient Hospital Stay: Payer: Medicare Other

## 2020-02-03 VITALS — BP 161/54 | HR 63 | Resp 18

## 2020-02-03 DIAGNOSIS — E538 Deficiency of other specified B group vitamins: Secondary | ICD-10-CM

## 2020-02-03 DIAGNOSIS — D631 Anemia in chronic kidney disease: Secondary | ICD-10-CM

## 2020-02-03 DIAGNOSIS — N183 Chronic kidney disease, stage 3 unspecified: Secondary | ICD-10-CM | POA: Diagnosis not present

## 2020-02-03 LAB — CBC WITH DIFFERENTIAL/PLATELET
Abs Immature Granulocytes: 0.03 10*3/uL (ref 0.00–0.07)
Basophils Absolute: 0 10*3/uL (ref 0.0–0.1)
Basophils Relative: 0 %
Eosinophils Absolute: 0.8 10*3/uL — ABNORMAL HIGH (ref 0.0–0.5)
Eosinophils Relative: 14 %
HCT: 40.8 % (ref 36.0–46.0)
Hemoglobin: 12.8 g/dL (ref 12.0–15.0)
Immature Granulocytes: 1 %
Lymphocytes Relative: 34 %
Lymphs Abs: 2 10*3/uL (ref 0.7–4.0)
MCH: 26.6 pg (ref 26.0–34.0)
MCHC: 31.4 g/dL (ref 30.0–36.0)
MCV: 84.8 fL (ref 80.0–100.0)
Monocytes Absolute: 0.4 10*3/uL (ref 0.1–1.0)
Monocytes Relative: 6 %
Neutro Abs: 2.6 10*3/uL (ref 1.7–7.7)
Neutrophils Relative %: 45 %
Platelets: 159 10*3/uL (ref 150–400)
RBC: 4.81 MIL/uL (ref 3.87–5.11)
RDW: 14.7 % (ref 11.5–15.5)
WBC: 5.9 10*3/uL (ref 4.0–10.5)
nRBC: 0 % (ref 0.0–0.2)

## 2020-02-03 MED ORDER — CYANOCOBALAMIN 1000 MCG/ML IJ SOLN
1000.0000 ug | Freq: Once | INTRAMUSCULAR | Status: AC
  Administered 2020-02-03: 1000 ug via INTRAMUSCULAR

## 2020-02-03 MED ORDER — CYANOCOBALAMIN 1000 MCG/ML IJ SOLN
INTRAMUSCULAR | Status: AC
  Filled 2020-02-03: qty 1

## 2020-02-03 NOTE — Telephone Encounter (Signed)
Last Visit: 10/26/2019 Next Visit: 05/02/2020 Labs: 01/06/2020 Hgb 10.8 Hct 35.8, MCH 25.8, Eosinophils Absolute 0.9 CMP 09/20/2019 BUN 32, Creat. 2.18, GFR 25, Total Protein  Current Dose per office note 10/26/2019: not noted  DX: Chondrocalcinosis  Okay to refill Colchicine?

## 2020-02-03 NOTE — Patient Instructions (Signed)
Cyanocobalamin, Pyridoxine, and Folate What is this medicine? A multivitamin containing folic acid, vitamin B6, and vitamin B12. This medicine may be used for other purposes; ask your health care provider or pharmacist if you have questions. COMMON BRAND NAME(S): AllanFol RX, AllanTex, Av-Vite FB, B Complex with Folic Acid, ComBgen, FaBB, Folamin, Folastin, Folbalin, Folbee, Folbic, Folcaps, Folgard, Folgard RX, Folgard RX 2.2, Folplex, Folplex 2.2, Foltabs 800, Foltx, Homocysteine Formula, Niva-Fol, NuFol, TL Gard RX, Virt-Gard, Virt-Vite, Virt-Vite Forte, Vita-Respa What should I tell my health care provider before I take this medicine? They need to know if you have any of these conditions:  bleeding or clotting disorder  history of anemia of any type  other chronic health condition  an unusual or allergic reaction to vitamins, other medicines, foods, dyes, or preservatives  pregnant or trying to get pregnant  breast-feeding How should I use this medicine? Take by mouth with a glass of water. May take with food. Follow the directions on the prescription label. It is usually given once a day. Do not take your medicine more often than directed. Contact your pediatrician regarding the use of this medicine in children. Special care may be needed. Overdosage: If you think you have taken too much of this medicine contact a poison control center or emergency room at once. NOTE: This medicine is only for you. Do not share this medicine with others. What if I miss a dose? If you miss a dose, take it as soon as you can. If it is almost time for your next dose, take only that dose. Do not take double or extra doses. What may interact with this medicine?  levodopa This list may not describe all possible interactions. Give your health care provider a list of all the medicines, herbs, non-prescription drugs, or dietary supplements you use. Also tell them if you smoke, drink alcohol, or use illegal  drugs. Some items may interact with your medicine. What should I watch for while using this medicine? See your health care professional for regular checks on your progress. Remember that vitamin supplements do not replace the need for good nutrition from a balanced diet. What side effects may I notice from receiving this medicine? Side effects that you should report to your doctor or health care professional as soon as possible:  allergic reaction such as skin rash or difficulty breathing  vomiting Side effects that usually do not require medical attention (report to your doctor or health care professional if they continue or are bothersome):  nausea  stomach upset This list may not describe all possible side effects. Call your doctor for medical advice about side effects. You may report side effects to FDA at 1-800-FDA-1088. Where should I keep my medicine? Keep out of the reach of children. Most vitamins should be stored at controlled room temperature. Check your specific product directions. Protect from heat and moisture. Throw away any unused medicine after the expiration date. NOTE: This sheet is a summary. It may not cover all possible information. If you have questions about this medicine, talk to your doctor, pharmacist, or health care provider.  2020 Elsevier/Gold Standard (2007-05-02 00:59:55)  

## 2020-02-03 NOTE — Progress Notes (Signed)
No Aranesp today due to parameters.

## 2020-02-07 DIAGNOSIS — I10 Essential (primary) hypertension: Secondary | ICD-10-CM | POA: Diagnosis not present

## 2020-02-07 DIAGNOSIS — N1832 Chronic kidney disease, stage 3b: Secondary | ICD-10-CM | POA: Diagnosis not present

## 2020-02-07 DIAGNOSIS — I872 Venous insufficiency (chronic) (peripheral): Secondary | ICD-10-CM | POA: Diagnosis not present

## 2020-02-07 DIAGNOSIS — E119 Type 2 diabetes mellitus without complications: Secondary | ICD-10-CM | POA: Diagnosis not present

## 2020-02-07 DIAGNOSIS — I25111 Atherosclerotic heart disease of native coronary artery with angina pectoris with documented spasm: Secondary | ICD-10-CM | POA: Diagnosis not present

## 2020-02-10 MED FILL — FORTEO 600 MCG/2.4 ML PEN I: 620 | 28 days supply | Qty: 2 | Fill #5

## 2020-02-16 DIAGNOSIS — N2581 Secondary hyperparathyroidism of renal origin: Secondary | ICD-10-CM | POA: Diagnosis not present

## 2020-02-16 DIAGNOSIS — N1832 Chronic kidney disease, stage 3b: Secondary | ICD-10-CM | POA: Diagnosis not present

## 2020-02-16 DIAGNOSIS — D631 Anemia in chronic kidney disease: Secondary | ICD-10-CM | POA: Diagnosis not present

## 2020-02-16 DIAGNOSIS — I129 Hypertensive chronic kidney disease with stage 1 through stage 4 chronic kidney disease, or unspecified chronic kidney disease: Secondary | ICD-10-CM | POA: Diagnosis not present

## 2020-02-29 NOTE — Progress Notes (Signed)
Office Visit Note  Patient: Abigail Hoover             Date of Birth: 27-Jan-1946           MRN: [id number]             PCP: [name], MD Referring: [name], MD Visit Date: 03/02/2020 Occupation: @GUAROCC @  Subjective:  Lower back pain and hip pain.   History of Present Illness: Abigail Hoover is a 74 y.o. female with history of osteoarthritis, degenerative disc disease and osteoporosis.  She states she has been taking Forteo injections on a daily basis.  She has been having some discomfort over the left trochanteric bursae especially when she is walking.  She also complains of some discomfort in the lower back.  She symptoms have pain in the anterior part of the right thigh.  She states that she has a giveaway sensation in her right lower extremity at times.  Activities of Daily Living:  Patient reports morning stiffness for 5-60 minutes.   Patient Denies nocturnal pain.  Difficulty dressing/grooming: Denies Difficulty climbing stairs: Denies Difficulty getting out of chair: Denies Difficulty using hands for taps, buttons, cutlery, and/or writing: Denies  Review of Systems  Constitutional: Negative for fatigue.  HENT: Negative for mouth sores, mouth dryness and nose dryness.   Eyes: Negative for pain, itching, visual disturbance and dryness.  Respiratory: Negative for cough, hemoptysis, shortness of breath and difficulty breathing.   Cardiovascular: Negative for chest pain, palpitations and swelling in legs/feet.  Gastrointestinal: Negative for abdominal pain, blood in stool and constipation.  Endocrine: Negative for increased urination.  Genitourinary: Negative for painful urination.  Musculoskeletal: Positive for arthralgias, joint pain and morning stiffness. Negative for joint swelling, myalgias, muscle weakness, muscle tenderness and myalgias.  Skin: Negative for color change, rash and redness.  Allergic/Immunologic: Negative for susceptible to infections.   Neurological: Negative for dizziness, numbness, headaches and memory loss.  Hematological: Negative for swollen glands.  Psychiatric/Behavioral: Negative for confusion and sleep disturbance.    PMFS History:  Patient Active Problem List   Diagnosis Date Noted  . DDD (degenerative disc disease), lumbar 08/26/2019  . Age-related osteoporosis without current pathological fracture 08/26/2019  . Primary osteoarthritis of both knees 08/26/2019  . Primary osteoarthritis of both hands 08/26/2019  . Diabetes mellitus with chronic kidney disease, with long-term current use of insulin ([place]) 05/11/2019  . Pain due to onychomycosis of toenails of both feet 09/30/2018  . Chronic pain of right thumb 08/27/2018  . Plantar fasciitis of left foot 05/19/2018  . Lower limb pain, inferior, left 04/28/2018  . Dry skin 03/12/2018  . Other fatigue 09/18/2017  . Pain and swelling of right wrist 08/06/2017  . At high risk for breast cancer 03/06/2017  . Hypercalcemia 12/16/2016  . Vitamin B 12 deficiency 12/16/2016  . Displaced fracture of left femoral neck ([place]) 03/02/2016  . Hyperkalemia 03/01/2016  . Femoral neck fracture ([place]) 02/29/2016  . Anemia due to stage 3 (moderate) chronic renal failure treated with erythropoietin ([place]) 12/18/2015  . Chronic kidney disease, stage III (moderate) ([place]) 08/14/2015  . 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 Acute coronary syndrome ([place]) 01/02/2015  . Deficiency anemia 05/27/2014  . Carpal tunnel syndrome 12/09/2013  . Diabetic neuropathy ([place]) 12/09/2013  . Type 2 diabetes mellitus with stage 4 chronic kidney disease, with long-term current use of insulin ([place]) 04/21/2013  . Disturbance of skin sensation 04/21/2013  . Hypothyroidism 10/30/2012  . Breast mass in female-left  03/10/2012  . Weight loss 11/28/2011  . Family history of breast cancer 08/01/2010  . Essential hypertension 02/01/2009  . EDEMA LEGS 02/01/2009    Past Medical History:   Diagnosis Date  . 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 Anxiety   . Arthritis   . Bladder irritation   . Diabetes mellitus   . GERD (gastroesophageal reflux disease)   . Hypercholesterolemia   . Hypertension   . Hypothyroidism   . Iron deficiency anemia, unspecified   . Psoriasis   . Transfusion history 03/05/2016   for postoperative (ORIF) anemia superimposed on chronic anemia    Family History  Problem Relation Age of Onset  . Heart disease Mother        heart attack  . Stroke Father        brain hemorrhage  . Diabetes Brother   . Breast cancer Sister   . Cancer Sister        breast cancer   Past Surgical History:  Procedure Laterality Date  . ABDOMINAL HYSTERECTOMY     TAH  . BREAST BIOPSY     Left breast biopsy benign lesion  . BREAST BIOPSY  03/11/2012   Procedure: BREAST BIOPSY;  Surgeon: Odis Hollingshead, MD;  Location: [facility];  Service: General;  Laterality: Left;  remove left breast mass  . BREAST EXCISIONAL BIOPSY Left   . CARDIAC CATHETERIZATION N/A 01/02/2015   Procedure: Left Heart Cath and Coronary Angiography;  Surgeon: Charolette Forward, MD;  Location: [facility];  Service: Cardiovascular;  Laterality: N/A;  . CATARACT EXTRACTION    . CERVICAL DISC SURGERY    . COLONOSCOPY  2013   neg. next 2023.   Marland Kitchen EYE SURGERY    . IR KYPHO EA ADDL LEVEL THORACIC OR LUMBAR  09/09/2019  . IR KYPHO LUMBAR INC FX REDUCE BONE BX UNI/BIL CANNULATION INC/IMAGING  09/09/2019  . IR RADIOLOGIST EVAL & MGMT  09/08/2019  . LEFT HIP SURGERY    . TOTAL HIP ARTHROPLASTY Left 03/02/2016   Procedure: TOTAL HIP ARTHROPLASTY ANTERIOR APPROACH;  Surgeon: Rod Can, MD;  Location: [facility];  Service: Orthopedics;  Laterality: Left;   Social History   Social History Narrative   Patient is married with one child.   Patient is right handed.   Patient has college education.   Patient drinks tea, two times a day   Immunization History  Administered Date(s) Administered   . Fluad Quad(high Dose 65+) 12/22/2018  . Influenza Split 11/13/2010  . Influenza, High Dose Seasonal PF 01/25/2016, 12/24/2016  . Influenza,inj,Quad PF,6+ Mos 12/29/2012, 12/20/2013, 01/16/2015  . Pneumococcal Conjugate-13 01/16/2015  . Pneumococcal Polysaccharide-23 03/24/2013     Objective: Vital Signs: BP 104/64 (BP Location: Left Arm, Patient Position: Sitting, Cuff Size: Small)   Pulse 71   Ht 5\' 1"  (1.549 m)   Wt 122 lb 6.4 oz (55.5 kg)   BMI 23.13 kg/m    Physical Exam Vitals and nursing note reviewed.  Constitutional:      Appearance: She is well-developed and well-nourished.  HENT:     Head: Normocephalic and atraumatic.  Eyes:     Extraocular Movements: EOM normal.     Conjunctiva/sclera: Conjunctivae normal.  Cardiovascular:     Rate and Rhythm: Normal rate and regular rhythm.     Pulses: Intact distal pulses.     Heart sounds: Normal heart sounds.  Pulmonary:     Effort: Pulmonary effort is normal.     Breath sounds:  Normal breath sounds.  Abdominal:     General: Bowel sounds are normal.     Palpations: Abdomen is soft.  Musculoskeletal:     Cervical back: Normal range of motion.  Lymphadenopathy:     Cervical: No cervical adenopathy.  Skin:    General: Skin is warm and dry.     Capillary Refill: Capillary refill takes less than 2 seconds.  Neurological:     Mental Status: She is alert and oriented to person, place, and time.  Psychiatric:        Mood and Affect: Mood and affect normal.        Behavior: Behavior normal.      Musculoskeletal Exam: She has limited range of motion of cervical spine.  She has thoracic kyphosis.  She has limited range of motion of her lumbar spine.  She had discomfort in the lower lumbar region.  There was no point tenderness.  Shoulder joints, elbow joints, wrist joints with good range of motion.  She had no synovitis over MCPs or PIPs.  Hip joints and knee joints with good range of motion.  CDAI Exam: CDAI Score:  -- Patient Global: --; Provider Global: -- Swollen: --; Tender: -- Joint Exam 03/02/2020   No joint exam has been documented for this visit   There is currently no information documented on the homunculus. Go to the Rheumatology activity and complete the homunculus joint exam.  Investigation: No additional findings.  Imaging: No results found.  Recent Labs: Lab Results  Component Value Date   WBC 5.9 02/03/2020   HGB 12.8 02/03/2020   PLT 159 02/03/2020   NA 138 09/20/2019   K 4.4 09/20/2019   CL 100 09/20/2019   CO2 29 09/20/2019   GLUCOSE 98 09/20/2019   BUN 32 (H) 09/20/2019   CREATININE 2.18 (H) 09/20/2019   BILITOT 0.5 09/20/2019   ALKPHOS 68 04/29/2019   AST 16 09/20/2019   ALT 6 09/20/2019   PROT 6.0 (L) 09/20/2019   PROT 6.0 (L) 09/20/2019   ALBUMIN 4.0 04/29/2019   CALCIUM 9.9 09/20/2019   GFRAA 25 (L) 09/20/2019    Speciality Comments: No specialty comments available.  Procedures:  No procedures performed Allergies: Atorvastatin and Ezetimibe   Assessment / Plan:     Visit Diagnoses: Age-related osteoporosis with current pathological fracture, sequela - DXADate of Scan: 09/22/2019 Lowest T-score and site measured: -2.2 Right Femoral Neck. started on Forteo on September 29, 2019.  She has been tolerating Forteo injections well.  She had CBC in November 2021 which was normal.  She states she had recent labs at Dr. Serita Grit office.  We will request those labs.  Vertebral fracture, osteoporotic, sequela - status post kyphoplasty.  Localized pain had resolved.  Other secondary kyphosis, thoracolumbar region  DDD (degenerative disc disease), lumbar-she continues to have some lower back pain.  I offered physical therapy which she declined.  She is in the process of moving.  She may benefit from injections if the pain gets worse.  Facet arthropathy, lumbar-chronic pain.  Primary osteoarthritis of both hands-currently not having much discomfort.  Primary  osteoarthritis of both knees - she has bilateral moderate osteoarthritis and mild chondromalacia patella.  Discomfort is manageable.  Chondrocalcinosis-she has not had any recent flare.  Hyperuricemia - colchicine 0.3mg  by mouth daily.   Displaced fracture of left femoral neck (HCC)  Psoriasis  History of diabetes mellitus  Essential hypertension  Hypothyroidism, unspecified type - followed by Dr. Dwyane Dee.  Other diabetic neurological  complication associated with type 2 diabetes mellitus ([place])  Anemia due to stage 3 (moderate) chronic renal failure treated with erythropoietin ([place]) - followed by Dr. Graylon Gunning.  Orders: No orders of the defined types were placed in this encounter.  No orders of the defined types were placed in this encounter.    Follow-Up Instructions: Return for Osteoporosis, Osteoarthritis.   Bo Merino, MD  Note - This record has been created using Editor, commissioning.  Chart creation errors have been sought, but may not always  have been located. Such creation errors do not reflect on  the standard of medical care.

## 2020-03-01 ENCOUNTER — Ambulatory Visit: Payer: Medicare Other | Admitting: Podiatry

## 2020-03-01 ENCOUNTER — Other Ambulatory Visit: Payer: Self-pay | Admitting: Endocrinology

## 2020-03-01 ENCOUNTER — Other Ambulatory Visit: Payer: Self-pay

## 2020-03-01 ENCOUNTER — Encounter: Payer: Self-pay | Admitting: Podiatry

## 2020-03-01 DIAGNOSIS — N183 Chronic kidney disease, stage 3 unspecified: Secondary | ICD-10-CM

## 2020-03-01 DIAGNOSIS — M79675 Pain in left toe(s): Secondary | ICD-10-CM

## 2020-03-01 DIAGNOSIS — E1122 Type 2 diabetes mellitus with diabetic chronic kidney disease: Secondary | ICD-10-CM

## 2020-03-01 DIAGNOSIS — M79674 Pain in right toe(s): Secondary | ICD-10-CM

## 2020-03-01 DIAGNOSIS — B351 Tinea unguium: Secondary | ICD-10-CM

## 2020-03-01 DIAGNOSIS — Z794 Long term (current) use of insulin: Secondary | ICD-10-CM

## 2020-03-02 ENCOUNTER — Encounter: Payer: Self-pay | Admitting: Rheumatology

## 2020-03-02 ENCOUNTER — Ambulatory Visit: Payer: Medicare Other | Admitting: Rheumatology

## 2020-03-02 VITALS — BP 104/64 | HR 71 | Ht 61.0 in | Wt 122.4 lb

## 2020-03-02 DIAGNOSIS — M4015 Other secondary kyphosis, thoracolumbar region: Secondary | ICD-10-CM | POA: Diagnosis not present

## 2020-03-02 DIAGNOSIS — M8008XS Age-related osteoporosis with current pathological fracture, vertebra(e), sequela: Secondary | ICD-10-CM | POA: Diagnosis not present

## 2020-03-02 DIAGNOSIS — I10 Essential (primary) hypertension: Secondary | ICD-10-CM

## 2020-03-02 DIAGNOSIS — M5136 Other intervertebral disc degeneration, lumbar region: Secondary | ICD-10-CM

## 2020-03-02 DIAGNOSIS — M19041 Primary osteoarthritis, right hand: Secondary | ICD-10-CM

## 2020-03-02 DIAGNOSIS — E039 Hypothyroidism, unspecified: Secondary | ICD-10-CM

## 2020-03-02 DIAGNOSIS — M47816 Spondylosis without myelopathy or radiculopathy, lumbar region: Secondary | ICD-10-CM

## 2020-03-02 DIAGNOSIS — M8000XS Age-related osteoporosis with current pathological fracture, unspecified site, sequela: Secondary | ICD-10-CM | POA: Diagnosis not present

## 2020-03-02 DIAGNOSIS — M170 Bilateral primary osteoarthritis of knee: Secondary | ICD-10-CM

## 2020-03-02 DIAGNOSIS — E1149 Type 2 diabetes mellitus with other diabetic neurological complication: Secondary | ICD-10-CM

## 2020-03-02 DIAGNOSIS — Z8639 Personal history of other endocrine, nutritional and metabolic disease: Secondary | ICD-10-CM

## 2020-03-02 DIAGNOSIS — D631 Anemia in chronic kidney disease: Secondary | ICD-10-CM

## 2020-03-02 DIAGNOSIS — S72002A Fracture of unspecified part of neck of left femur, initial encounter for closed fracture: Secondary | ICD-10-CM

## 2020-03-02 DIAGNOSIS — M19042 Primary osteoarthritis, left hand: Secondary | ICD-10-CM

## 2020-03-02 DIAGNOSIS — N183 Chronic kidney disease, stage 3 unspecified: Secondary | ICD-10-CM

## 2020-03-02 DIAGNOSIS — E790 Hyperuricemia without signs of inflammatory arthritis and tophaceous disease: Secondary | ICD-10-CM

## 2020-03-02 DIAGNOSIS — L409 Psoriasis, unspecified: Secondary | ICD-10-CM

## 2020-03-02 DIAGNOSIS — M1120 Other chondrocalcinosis, unspecified site: Secondary | ICD-10-CM

## 2020-03-03 ENCOUNTER — Telehealth: Payer: Self-pay

## 2020-03-03 ENCOUNTER — Inpatient Hospital Stay: Payer: Medicare Other

## 2020-03-03 ENCOUNTER — Inpatient Hospital Stay: Payer: Medicare Other | Attending: Hematology and Oncology

## 2020-03-03 DIAGNOSIS — N183 Chronic kidney disease, stage 3 unspecified: Secondary | ICD-10-CM | POA: Insufficient documentation

## 2020-03-03 DIAGNOSIS — D631 Anemia in chronic kidney disease: Secondary | ICD-10-CM | POA: Insufficient documentation

## 2020-03-03 DIAGNOSIS — E538 Deficiency of other specified B group vitamins: Secondary | ICD-10-CM | POA: Insufficient documentation

## 2020-03-03 NOTE — Telephone Encounter (Signed)
Called and left message with her husband to reschedule today's missed appts.

## 2020-03-03 NOTE — Telephone Encounter (Signed)
Called and reschedule lab/ injection appt to Monday 12/13 at 2 pm. She is aware of appt times.

## 2020-03-06 ENCOUNTER — Other Ambulatory Visit: Payer: Self-pay

## 2020-03-06 ENCOUNTER — Inpatient Hospital Stay: Payer: Medicare Other

## 2020-03-06 ENCOUNTER — Other Ambulatory Visit: Payer: Self-pay | Admitting: Hematology and Oncology

## 2020-03-06 DIAGNOSIS — D631 Anemia in chronic kidney disease: Secondary | ICD-10-CM | POA: Diagnosis not present

## 2020-03-06 DIAGNOSIS — N183 Chronic kidney disease, stage 3 unspecified: Secondary | ICD-10-CM

## 2020-03-06 DIAGNOSIS — N189 Chronic kidney disease, unspecified: Secondary | ICD-10-CM

## 2020-03-06 DIAGNOSIS — E538 Deficiency of other specified B group vitamins: Secondary | ICD-10-CM | POA: Diagnosis not present

## 2020-03-06 LAB — CBC WITH DIFFERENTIAL/PLATELET
Abs Immature Granulocytes: 0.01 10*3/uL (ref 0.00–0.07)
Basophils Absolute: 0 10*3/uL (ref 0.0–0.1)
Basophils Relative: 0 %
Eosinophils Absolute: 0.9 10*3/uL — ABNORMAL HIGH (ref 0.0–0.5)
Eosinophils Relative: 13 %
HCT: 39.5 % (ref 36.0–46.0)
Hemoglobin: 12 g/dL (ref 12.0–15.0)
Immature Granulocytes: 0 %
Lymphocytes Relative: 29 %
Lymphs Abs: 2 10*3/uL (ref 0.7–4.0)
MCH: 26.1 pg (ref 26.0–34.0)
MCHC: 30.4 g/dL (ref 30.0–36.0)
MCV: 86.1 fL (ref 80.0–100.0)
Monocytes Absolute: 0.4 10*3/uL (ref 0.1–1.0)
Monocytes Relative: 6 %
Neutro Abs: 3.6 10*3/uL (ref 1.7–7.7)
Neutrophils Relative %: 52 %
Platelets: 181 10*3/uL (ref 150–400)
RBC: 4.59 MIL/uL (ref 3.87–5.11)
RDW: 14.6 % (ref 11.5–15.5)
WBC: 6.9 10*3/uL (ref 4.0–10.5)
nRBC: 0 % (ref 0.0–0.2)

## 2020-03-06 MED ORDER — CYANOCOBALAMIN 1000 MCG/ML IJ SOLN
INTRAMUSCULAR | Status: AC
  Filled 2020-03-06: qty 1

## 2020-03-06 MED ORDER — CYANOCOBALAMIN 1000 MCG/ML IJ SOLN
1000.0000 ug | Freq: Once | INTRAMUSCULAR | Status: AC
  Administered 2020-03-06: 1000 ug via INTRAMUSCULAR

## 2020-03-06 NOTE — Progress Notes (Addendum)
Subjective: Abigail Hoover presents today at risk foot care. Pt has h/o NIDDM with chronic kidney disease and painful mycotic nails b/l that are difficult to trim. Pain interferes with ambulation. Aggravating factors include wearing enclosed shoe gear. Pain is relieved with periodic professional debridement.  She notes no new pedal concerns on today's visit.  PCP is Dr. [name]. Also followed by Dr. Philemon Kingdom and last visit was 12/28/2019.  Patient states her blood sugar was 185 mg/dl today.  Past Medical History:  Diagnosis Date  . 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 Anxiety   . Arthritis   . Bladder irritation   . Diabetes mellitus   . GERD (gastroesophageal reflux disease)   . Hypercholesterolemia   . Hypertension   . Hypothyroidism   . Iron deficiency anemia, unspecified   . Psoriasis   . Transfusion history 03/05/2016   for postoperative (ORIF) anemia superimposed on chronic anemia     Current Outpatient Medications on File Prior to Visit  Medication Sig Dispense Refill  . aspirin EC 81 MG tablet Take 81 mg by mouth daily. Take 1 tablet by mouth once daily.    Marland Kitchen CARAFATE 1 GM/10ML suspension TAKE 10ML BY MOUTH 4 (FOUR) TIMES A DAY BETWEEN MEALS AND AT BEDTIME    . colchicine 0.6 MG tablet TAKE ONE-HALF TABLETS (0.3 MG TOTAL) BY MOUTH DAILY. 15 tablet 2  . furosemide (LASIX) 40 MG tablet Take 40 mg by mouth. Take 1 tablet by mouth three times per week.    . gabapentin (NEURONTIN) 300 MG capsule Take by mouth.    Marland Kitchen glucose blood (ONETOUCH VERIO) test strip Use Onetouch verio test strips to check blood sugar three times daily. (90 day RX) 300 each 8  . Insulin Pen Needle (PEN NEEDLES 3/16") 31G X 5 MM MISC Use 1 pen needle to inject Forteo daily. 100 each 2  . Insulin Pen Needle 31G X 5 MM MISC Use with Novolog 100 each 1  . Insulin Syringe-Needle U-100 25G X 5/8" 1 ML MISC USE DAILY TO INJECT INSULIN 100 each 3  . Lancet Devices (ONE TOUCH DELICA LANCING  DEV) MISC 1 each by Does not apply route daily. Use as instructed to check blood sugar once daily. 1 each 0  . Lancets (ONETOUCH DELICA PLUS [id number]) MISC TEST BLOOD SUGAR ONCE DAILY 100 each 2  . metoprolol succinate (TOPROL-XL) 50 MG 24 hr tablet Take 1 tablet (50 mg total) by mouth daily. Take with or immediately following a meal. 30 tablet 3  . mirabegron ER (MYRBETRIQ) 50 MG TB24 tablet Take by mouth.    Marland Kitchen NOVOLOG FLEXPEN 100 UNIT/ML FlexPen Inject 4-11 Units into the skin 2 (two) times daily before a meal. 30 mL 3  . pantoprazole (PROTONIX) 40 MG tablet Take 40 mg by mouth daily.    . pentosan polysulfate (ELMIRON) 100 MG capsule TAKE 1 CAPSULE (100 MG TOTAL) BY MOUTH 3 TIMES DAILY BEFORE MEALS.    Marland Kitchen phenazopyridine (PYRIDIUM) 100 MG tablet 1 po qd prn burning (Patient not taking: Reported on 03/02/2020)    . pravastatin (PRAVACHOL) 20 MG tablet TAKE ONE TABLET BY MOUTH ONCE DAILY 90 tablet 1  . Semaglutide,0.25 or 0.5MG /DOS, (OZEMPIC, 0.25 OR 0.5 MG/DOSE,) 2 MG/1.5ML SOPN Inject 0.5 mg into the skin once a week. 4.5 mL 3  . Teriparatide, Recombinant, (FORTEO) 620 MCG/2.48ML SOPN Inject 20 mcg into the skin daily. Discard after 28 days. 1 pen 5   No current facility-administered medications  on file prior to visit.     Allergies  Allergen Reactions  . Atorvastatin Rash  . Ezetimibe Itching    Objective: Abigail Hoover is a pleasant 74 y.o. female in NAD. AAO x 3.  There were no vitals filed for this visit.  Vascular Examination: Neurovascular status unchanged b/l lower extremities. Capillary refill time to digits immediate b/l. Palpable DP pulses b/l. Palpable PT pulses b/l. Pedal hair sparse b/l. Skin temperature gradient within normal limits b/l. No edema noted b/l.  Dermatological Examination: Pedal skin with normal turgor, texture and tone bilaterally. No open wounds bilaterally. No interdigital macerations bilaterally. Toenails 1-5 b/l elongated, discolored, dystrophic,  thickened, crumbly with subungual debris and tenderness to dorsal palpation.  Musculoskeletal: Normal muscle strength 5/5 to all lower extremity muscle groups bilaterally. No pain crepitus or joint limitation noted with ROM b/l. No gross bony deformities bilaterally. Patient ambulates independent of any assistive aids.  Neurological Examination: Protective sensation intact 5/5 intact bilaterally with 10g monofilament b/l. Vibratory sensation intact b/l. Proprioception intact bilaterally.  Assessment: 1. Pain due to onychomycosis of toenails of both feet   2. Type 2 diabetes mellitus with stage 3 chronic kidney disease, with long-term current use of insulin, unspecified whether stage 3a or 3b CKD ([place])    Plan: -Examined patient. -No new findings. No new orders. -Continue diabetic foot care principles. Literature dispensed on today.  -Toenails 1-5 b/l were debrided in length and girth with sterile nail nippers and dremel without iatrogenic bleeding.  -Patient to continue soft, supportive shoe gear daily. -Patient to report any pedal injuries to medical professional immediately. -Patient/POA to call should there be question/concern in the interim.  Return in about 3 months (around 05/30/2020) for diabetic toenails.  Marzetta Board, DPM

## 2020-03-06 NOTE — Progress Notes (Signed)
Per MD, aranesp not needed today. Patient informed and verbalized understanding. Given B-12 injection as scheduled.

## 2020-03-06 NOTE — Patient Instructions (Signed)

## 2020-03-09 ENCOUNTER — Other Ambulatory Visit: Payer: Self-pay | Admitting: Rheumatology

## 2020-03-09 ENCOUNTER — Other Ambulatory Visit: Payer: Self-pay | Admitting: Pharmacist

## 2020-03-09 DIAGNOSIS — M810 Age-related osteoporosis without current pathological fracture: Secondary | ICD-10-CM

## 2020-03-09 MED FILL — FORTEO 600 MCG/2.4 ML PEN I: 620 | 28 days supply | Qty: 2 | Fill #0

## 2020-03-09 NOTE — Telephone Encounter (Signed)
Last Visit: 03/02/2020 Next Visit: 08/30/2020 Labs: 03/06/2020 Eosinophils Absolute 0.9  Current Dose per office note 03/02/2020: not mentioned  DX: Age-related osteoporosis with current pathological fracture, sequela  Okay to refill Forteo?

## 2020-03-09 NOTE — Telephone Encounter (Signed)
Routing refill request

## 2020-03-15 ENCOUNTER — Other Ambulatory Visit: Payer: Self-pay

## 2020-03-15 ENCOUNTER — Encounter (INDEPENDENT_AMBULATORY_CARE_PROVIDER_SITE_OTHER): Payer: Medicare Other | Admitting: Ophthalmology

## 2020-03-15 DIAGNOSIS — I10 Essential (primary) hypertension: Secondary | ICD-10-CM

## 2020-03-15 DIAGNOSIS — H35033 Hypertensive retinopathy, bilateral: Secondary | ICD-10-CM | POA: Diagnosis not present

## 2020-03-15 DIAGNOSIS — E103393 Type 1 diabetes mellitus with moderate nonproliferative diabetic retinopathy without macular edema, bilateral: Secondary | ICD-10-CM

## 2020-03-15 DIAGNOSIS — H43813 Vitreous degeneration, bilateral: Secondary | ICD-10-CM

## 2020-03-16 ENCOUNTER — Encounter (INDEPENDENT_AMBULATORY_CARE_PROVIDER_SITE_OTHER): Payer: Medicare Other | Admitting: Ophthalmology

## 2020-03-16 IMAGING — MG DIGITAL SCREENING BILAT W/ TOMO W/ CAD
6 of 10 series · 6 of 30 positions shown · non-contrast
Comparison: Previous exam(s).

CLINICAL DATA: Screening.

EXAM:
DIGITAL SCREENING BILATERAL MAMMOGRAM WITH TOMO AND CAD

[L MLO synth-2D (1 of 2)]
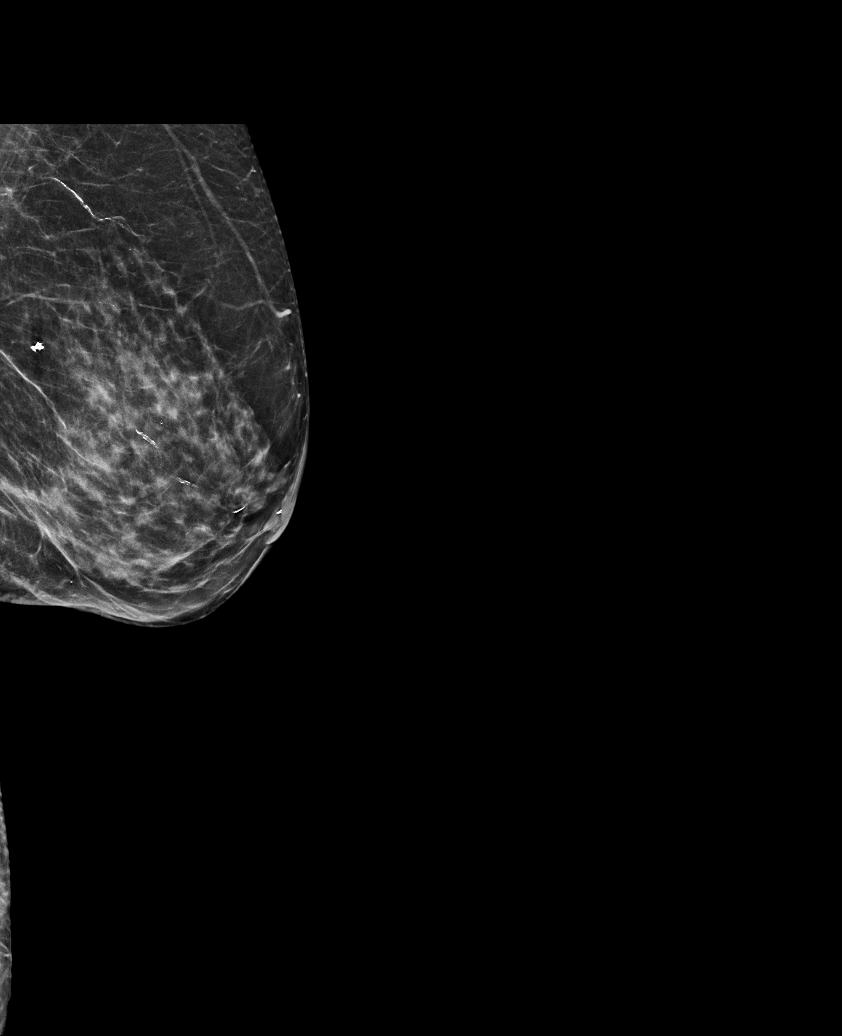

[R MLO synth-2D]
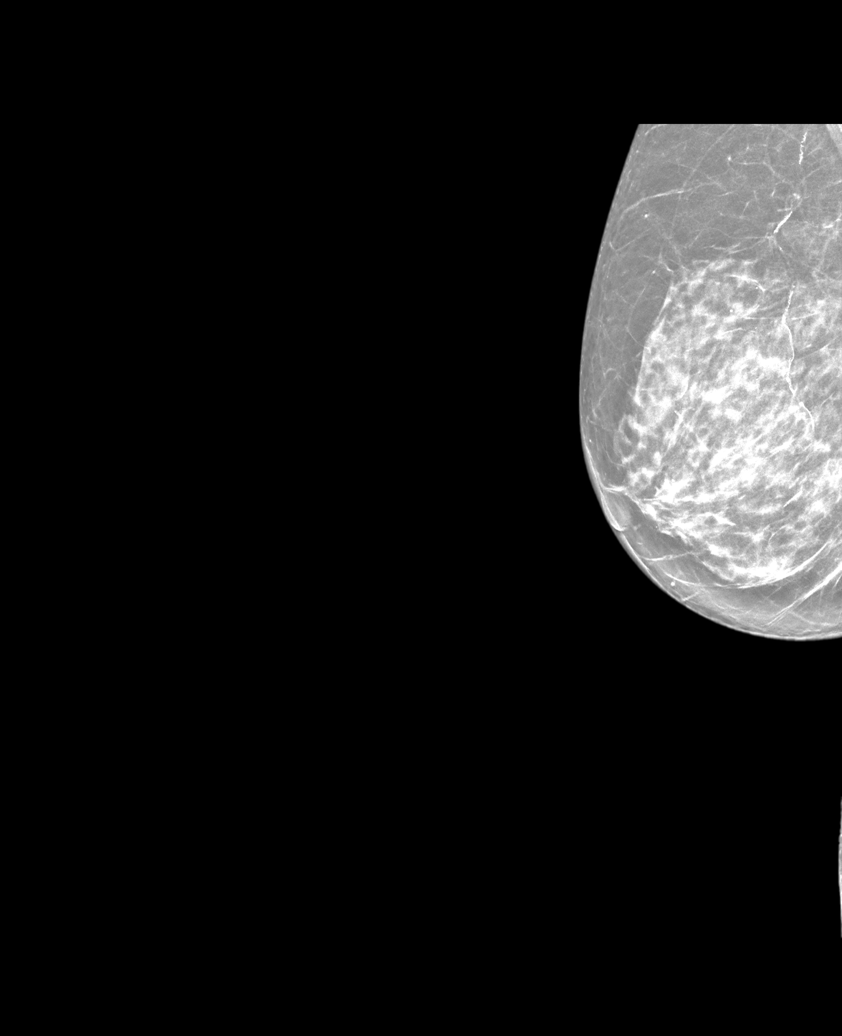

[R CC synth-2D]
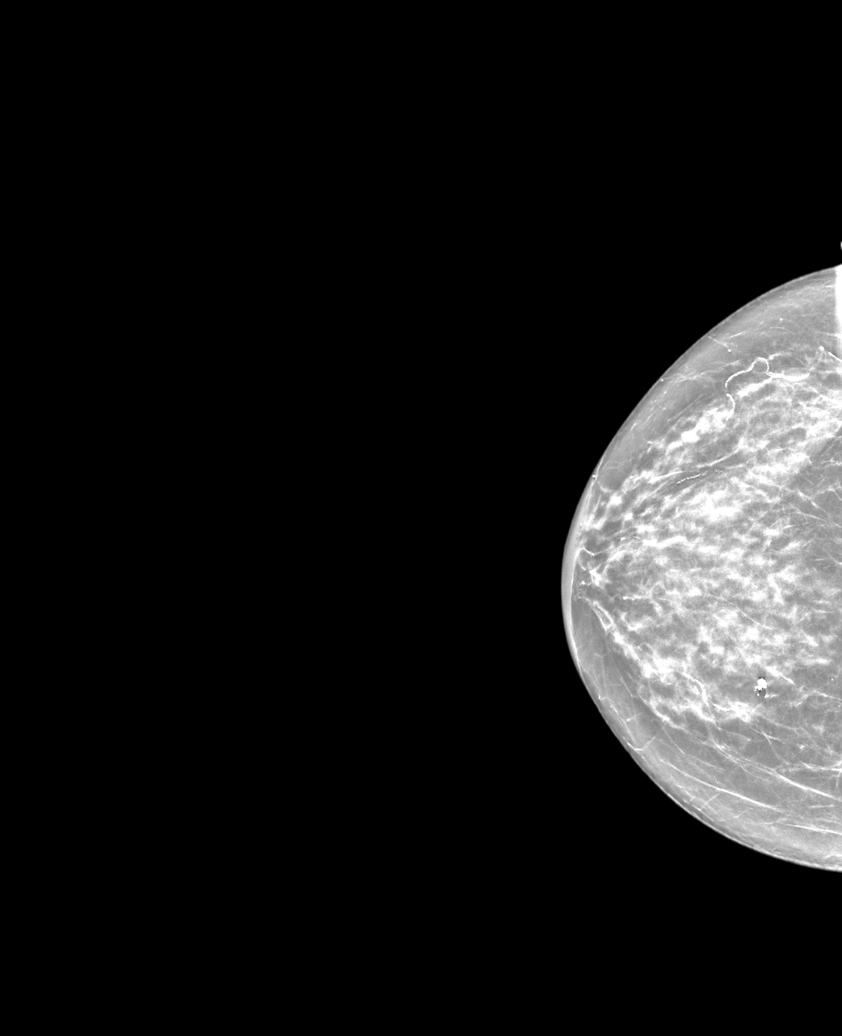

[L MLO synth-2D (2 of 2)]
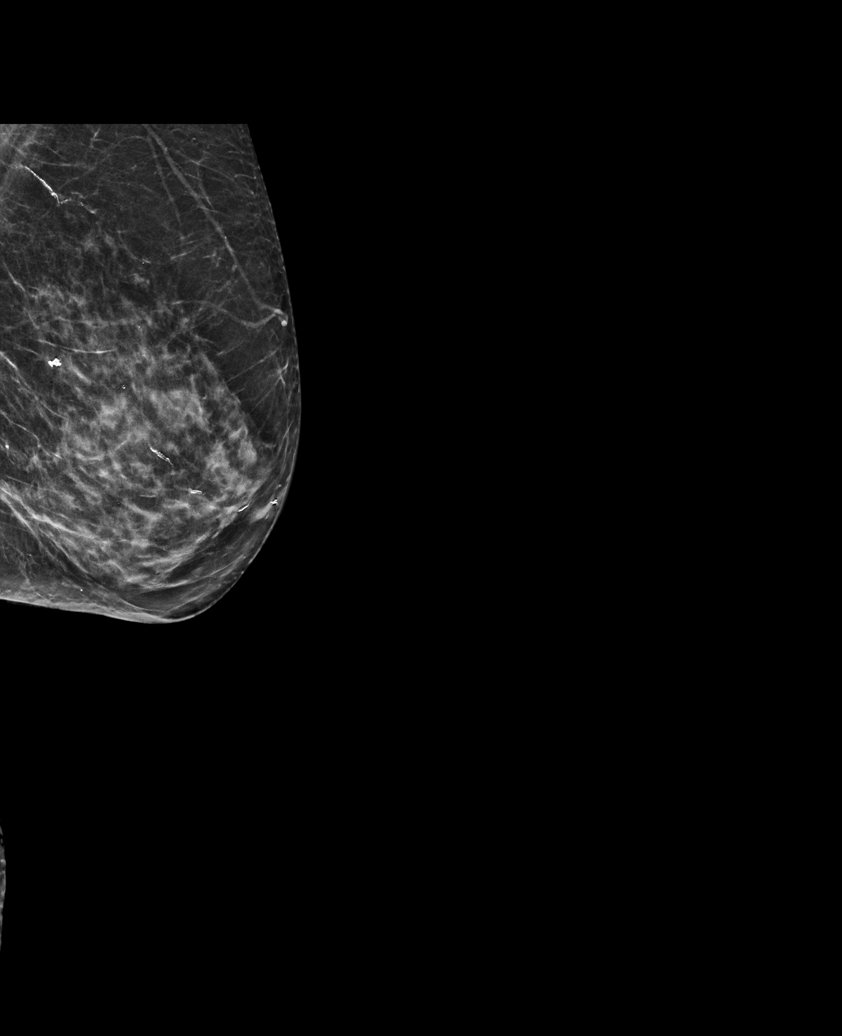

[L CC synth-2D]
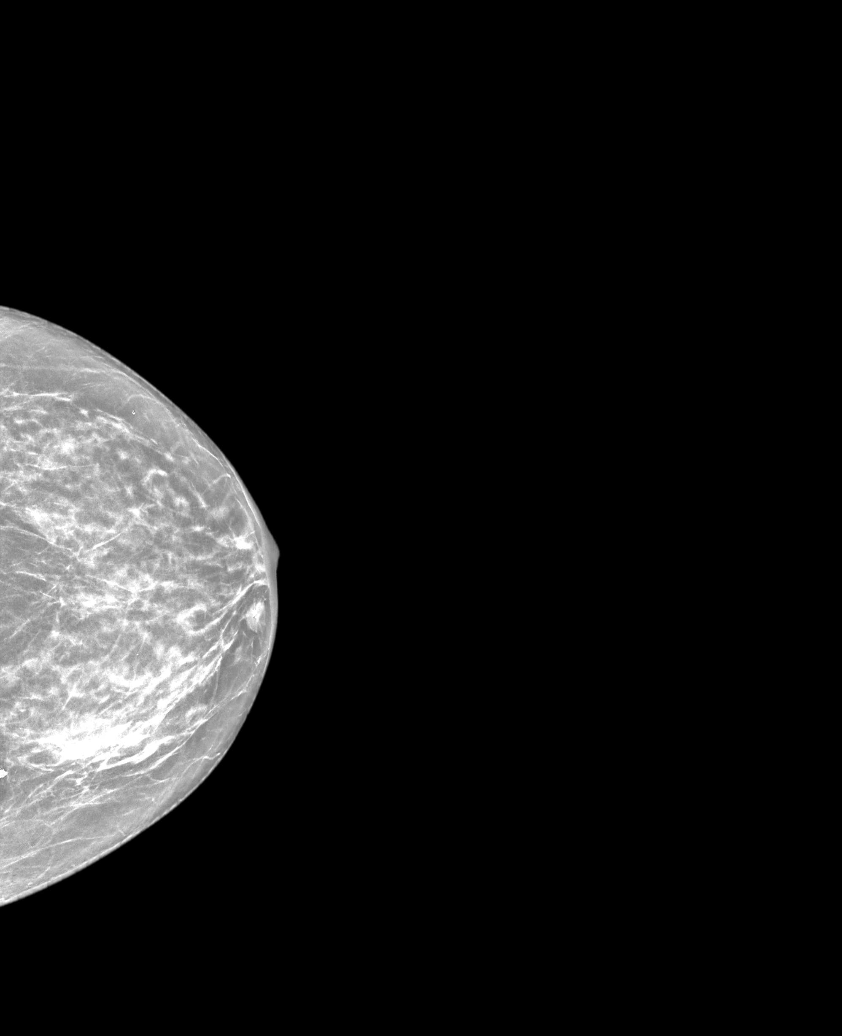

[R MLO tomo · tomo slice 31/62.0]
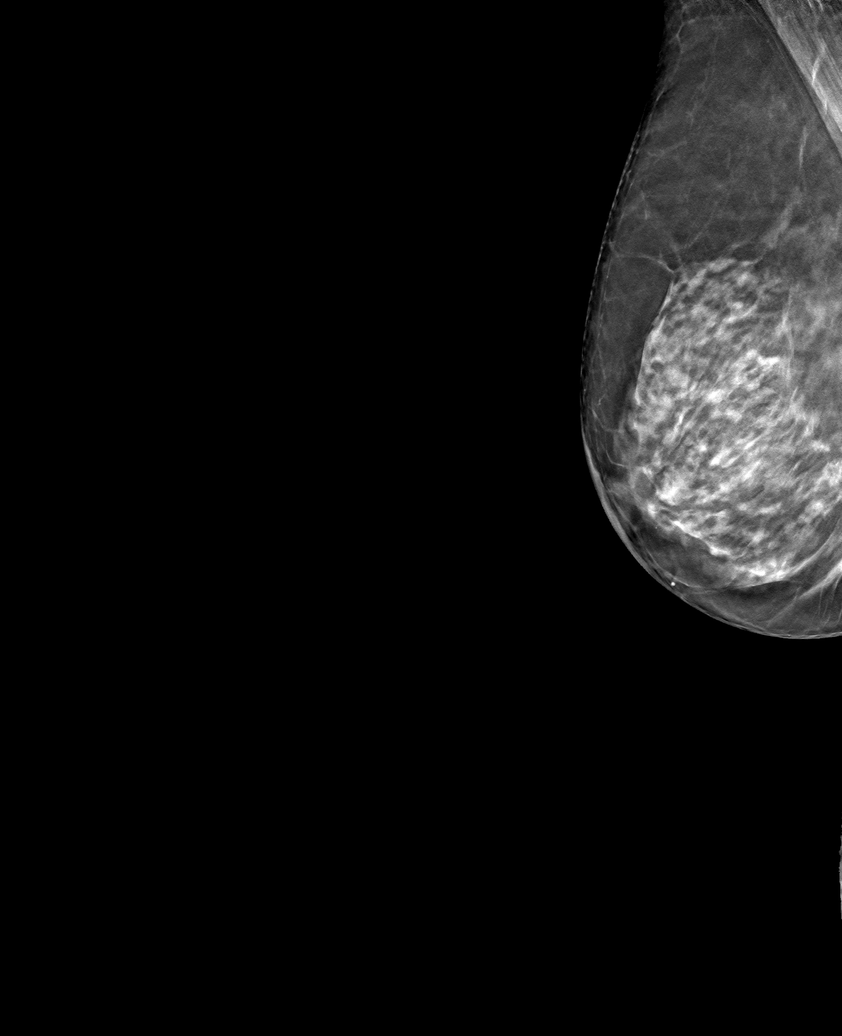

[6 of 30 positions shown; findings below may reference images not displayed]

ACR Breast Density Category c: The breast tissue is heterogeneously
dense, which may obscure small masses.
FINDINGS: There are no findings suspicious for malignancy. Images were
processed with CAD.
IMPRESSION: No mammographic evidence of malignancy. A result letter of this
screening mammogram will be mailed directly to the patient.

RECOMMENDATION:
Screening mammogram in one year. (Code:[id number]-U-LHB)

BI-RADS CATEGORY  1: Negative.

## 2020-03-27 ENCOUNTER — Other Ambulatory Visit: Payer: Self-pay

## 2020-03-27 ENCOUNTER — Encounter: Payer: Self-pay | Admitting: Internal Medicine

## 2020-03-27 ENCOUNTER — Ambulatory Visit: Payer: Medicare Other | Admitting: Internal Medicine

## 2020-03-27 VITALS — BP 116/78 | HR 65 | Ht 61.0 in | Wt 118.5 lb

## 2020-03-27 DIAGNOSIS — E039 Hypothyroidism, unspecified: Secondary | ICD-10-CM | POA: Diagnosis not present

## 2020-03-27 DIAGNOSIS — N184 Chronic kidney disease, stage 4 (severe): Secondary | ICD-10-CM

## 2020-03-27 DIAGNOSIS — E1122 Type 2 diabetes mellitus with diabetic chronic kidney disease: Secondary | ICD-10-CM

## 2020-03-27 DIAGNOSIS — Z794 Long term (current) use of insulin: Secondary | ICD-10-CM | POA: Diagnosis not present

## 2020-03-27 LAB — POCT GLYCOSYLATED HEMOGLOBIN (HGB A1C): Hemoglobin A1C: 6.6 % — AB (ref 4.0–5.6)

## 2020-03-27 MED ORDER — ONETOUCH VERIO VI STRP: ORAL_STRIP | 3 refills | Status: DC

## 2020-03-27 NOTE — Patient Instructions (Signed)
Please continue: - Ozempic 0.5 mg weekly in a.m. - Novolog 3x a day, before meals:  4-8 units before breakfast 4-8 units before dinner  Please continue Unithroid 75 mcg daily: take this every day, with water, at least 30 minutes before breakfast, separated by at least 4 hours from: - acid reflux medications - calcium - iron - multivitamins  Please return in 3-4 months with your sugar log.

## 2020-03-27 NOTE — Progress Notes (Signed)
Patient ID: Abigail Hoover, female   DOB: [birth year], 75 y.o.   MRN: [id number]   This visit occurred during the SARS-CoV-2 public health emergency.  Safety protocols were in place, including screening questions prior to the visit, additional usage of staff PPE, and extensive cleaning of exam room while observing appropriate contact time as indicated for disinfecting solutions.    HPI: Abigail Hoover is a pleasant 75 y.o.-year-old female, initially referred by her PCP, Dr. Delfina Redwood, returning for follow-up for DM2, dx in her 68s, insulin-dependent since 05/2019, uncontrolled, with complications (CKD stage IV, PN) and hypothyroidism.  She previously saw Dr. Dwyane Dee in this office, last 07/2019.  Last visit 77 months ago.  DM2:  Reviewed HbA1c levels: Lab Results  Component Value Date   HGBA1C 6.3 (A) 12/28/2019   HGBA1C 7.6 (H) 07/30/2019   HGBA1C 7.7 (H) 04/29/2019   HGBA1C 6.6 (H) 01/27/2019   HGBA1C 6.9 (H) 10/30/2018   HGBA1C 6.3 03/03/2018   HGBA1C 5.7 10/21/2017   HGBA1C 6.1 05/27/2017   HGBA1C 6.6 (H) 12/20/2016   HGBA1C 5.0 10/10/2016   At last visit, she was on: - Januvia 25 mg daily in a.m.  - Novolog 3x a day, before meals: 8-10 units before breakfast, 3 units before snack, 8-9 units before dinner Stopped Actos 2/2 edema. She was on Repaglinide (Prandin).  We changed to: - Ozempic 0.5 mg weekly in a.m. - Novolog 3x a day, before meals:  4-8 units before meals (usually 5-6 units)  Pt checks her sugars 3 times a day per review of her meter downloads: - am: 108-127, 202 >> 129 - 2h after b'fast: 131 (after 202) >> n/c - before lunch: 108-193, 212 >> 99-142, 155 - 2h after lunch: n/c >> 133-188 - before dinner: 123-198, 265 >> 70-184 (snack) - 2h after dinner: 55-196 >> 53, 97-173 - bedtime: n/c - nighttime: n/c Lowest sugar was 49, 55 >> 53; she has hypoglycemia awareness at 60s-70s.  Highest sugar was 265 >> 188.  Glucometer: One Probation officer Flex  Pt's meals are  vegetarian: - Breakfast: indian bread, veggies, tea - Lunch: skips - tea - Dinner: indian bread, rice, soup, veggies - Snacks: no  -+ CKD- seeing Dr. Posey Pronto, last BUN/creatinine:  02/07/2020:  Glu 122, BUN/Cr 28/1.47. GFR 34-per records from Dr. Posey Pronto Lab Results  Component Value Date   BUN 32 (H) 09/20/2019   BUN 33 (H) 07/30/2019   CREATININE 2.18 (H) 09/20/2019   CREATININE 1.74 (H) 07/30/2019  Not on ACE inhibitor/ARB.  -+ HL: last set of lipids: Lab Results  Component Value Date   CHOL 155 07/30/2019   HDL 57.00 07/30/2019   LDLCALC 76 07/30/2019   TRIG 110.0 07/30/2019   CHOLHDL 3 07/30/2019  On pravastatin 20 mg daily.  She had a rash with Lipitor.  - last eye exam was in 02/2020. No DR reportedly.   - she has numbness and tingling in her feet.  On gabapentin 300 mg for bladder.  On ASA 81.  Pt has FH of DM in brother, sister, mother, MGM.  Hypothyroidism:  Previously on Synthroid d.a.w. but currently on Unithroid d.a.w. (she prefers not to take generics).  Pt is on Unithroid d.a.w. 75 mcg daily, taken: - in am - fasting - at least 30 min from b'fast - no calcium - no iron - no multivitamins - no PPIs, + Carafate at night. Prev. On Dexilant. - not on Biotin  Reviewed TSH levels: Lab Results  Component Value Date  TSH 0.57 12/28/2019   TSH 0.20 (L) 07/30/2019   TSH 2.56 04/29/2019   TSH 1.97 01/27/2019   TSH 1.58 12/16/2018   TSH 10.00 (H) 10/30/2018   TSH 3.36 03/03/2018   TSH 1.64 10/21/2017   TSH 0.68 12/20/2016   TSH 1.63 07/26/2016   TSH 2.97 04/18/2016   TSH 0.32 (L) 01/19/2016   TSH 0.89 07/13/2015   TSH 0.97 12/07/2014   TSH 1.090 12/07/2014   Pt denies: - feeling nodules in neck - hoarseness - dysphagia - choking - SOB with lying down  No FH of thyroid cancer. No h/o radiation tx to head or neck.  No seaweed or kelp. No recent contrast studies. No herbal supplements. No Biotin use. No recent steroids use.   She has  osteoporosis, managed by Dr. Estanislado Pandy.  She is on Forteo.  Also on vitamin D 5000 units daily  Sees Dr. Collene Mares.  ROS: Constitutional: no weight gain/no weight loss, no fatigue, no subjective hyperthermia, no subjective hypothermia, + dysuria Eyes: no blurry vision, no xerophthalmia ENT: no sore throat, + see HPI Cardiovascular: no CP/no SOB/no palpitations/+ B leg swelling Respiratory: no cough/no SOB/no wheezing Gastrointestinal: no N/no V/no D/no C/+ acid reflux Musculoskeletal: no muscle aches/+ joint aches (back) Skin: no rashes, + hair loss Neurological: no tremors/no numbness/no tingling/no dizziness  I reviewed pt's medications, allergies, PMH, social hx, family hx, and changes were documented in the history of present illness. Otherwise, unchanged from my initial visit note.  Past Medical History:  Diagnosis Date  . 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 Anxiety   . Arthritis   . Bladder irritation   . Diabetes mellitus   . GERD (gastroesophageal reflux disease)   . Hypercholesterolemia   . Hypertension   . Hypothyroidism   . Iron deficiency anemia, unspecified   . Psoriasis   . Transfusion history 03/05/2016   for postoperative (ORIF) anemia superimposed on chronic anemia   Past Surgical History:  Procedure Laterality Date  . ABDOMINAL HYSTERECTOMY     TAH  . BREAST BIOPSY     Left breast biopsy benign lesion  . BREAST BIOPSY  03/11/2012   Procedure: BREAST BIOPSY;  Surgeon: Odis Hollingshead, MD;  Location: [facility];  Service: General;  Laterality: Left;  remove left breast mass  . BREAST EXCISIONAL BIOPSY Left   . CARDIAC CATHETERIZATION N/A 01/02/2015   Procedure: Left Heart Cath and Coronary Angiography;  Surgeon: Charolette Forward, MD;  Location: [facility];  Service: Cardiovascular;  Laterality: N/A;  . CATARACT EXTRACTION    . CERVICAL DISC SURGERY    . COLONOSCOPY  2013   neg. next 2023.   Marland Kitchen EYE SURGERY    . IR KYPHO EA ADDL LEVEL THORACIC OR  LUMBAR  09/09/2019  . IR KYPHO LUMBAR INC FX REDUCE BONE BX UNI/BIL CANNULATION INC/IMAGING  09/09/2019  . IR RADIOLOGIST EVAL & MGMT  09/08/2019  . LEFT HIP SURGERY    . TOTAL HIP ARTHROPLASTY Left 03/02/2016   Procedure: TOTAL HIP ARTHROPLASTY ANTERIOR APPROACH;  Surgeon: Rod Can, MD;  Location: [facility];  Service: Orthopedics;  Laterality: Left;   Social History   Socioeconomic History  . Marital status: Married    Spouse name: Not on file  . Number of children: 1  . Years of education: college  . Highest education level: Not on file  Occupational History  . Occupation: Homemaker  Tobacco Use  . Smoking status: Never Smoker  . Smokeless tobacco:  Never Used  Vaping Use  . Vaping Use: Never used  Substance and Sexual Activity  . Alcohol use: No    Alcohol/week: 0.0 standard drinks  . Drug use: No  . Sexual activity: Not Currently  Other Topics Concern  . Not on file  Social History Narrative   Patient is married with one child.   Patient is right handed.   Patient has college education.   Patient drinks tea, two times a day   Social Determinants of Health   Financial Resource Strain: Not on file  Food Insecurity: Not on file  Transportation Needs: Not on file  Physical Activity: Not on file  Stress: Not on file  Social Connections: Not on file  Intimate Partner Violence: Not on file   Current Outpatient Medications on File Prior to Visit  Medication Sig Dispense Refill  . aspirin EC 81 MG tablet Take 81 mg by mouth daily. Take 1 tablet by mouth once daily.    Marland Kitchen CARAFATE 1 GM/10ML suspension TAKE 10ML BY MOUTH 4 (FOUR) TIMES A DAY BETWEEN MEALS AND AT BEDTIME    . colchicine 0.6 MG tablet TAKE ONE-HALF TABLETS (0.3 MG TOTAL) BY MOUTH DAILY. 15 tablet 2  . FORTEO 620 MCG/2.48ML SOPN INJECT 20 MCG INTO THE SKIN DAILY. DISCARD AFTER 28 DAYS. 2.4 mL 5  . furosemide (LASIX) 40 MG tablet Take 40 mg by mouth. Take 1 tablet by mouth three times per week.    . gabapentin  (NEURONTIN) 300 MG capsule Take by mouth.    Marland Kitchen glucose blood (ONETOUCH VERIO) test strip Use Onetouch verio test strips to check blood sugar three times daily. (90 day RX) 300 each 8  . Insulin Pen Needle (PEN NEEDLES 3/16") 31G X 5 MM MISC Use 1 pen needle to inject Forteo daily. 100 each 2  . Insulin Pen Needle 31G X 5 MM MISC Use with Novolog 100 each 1  . Insulin Syringe-Needle U-100 25G X 5/8" 1 ML MISC USE DAILY TO INJECT INSULIN 100 each 3  . Lancet Devices (ONE TOUCH DELICA LANCING DEV) MISC 1 each by Does not apply route daily. Use as instructed to check blood sugar once daily. 1 each 0  . Lancets (ONETOUCH DELICA PLUS [id number]) MISC TEST BLOOD SUGAR ONCE DAILY 100 each 2  . metoprolol succinate (TOPROL-XL) 50 MG 24 hr tablet Take 1 tablet (50 mg total) by mouth daily. Take with or immediately following a meal. 30 tablet 3  . mirabegron ER (MYRBETRIQ) 50 MG TB24 tablet Take by mouth.    Marland Kitchen NOVOLOG FLEXPEN 100 UNIT/ML FlexPen Inject 4-11 Units into the skin 2 (two) times daily before a meal. 30 mL 3  . pantoprazole (PROTONIX) 40 MG tablet Take 40 mg by mouth daily.    . pentosan polysulfate (ELMIRON) 100 MG capsule TAKE 1 CAPSULE (100 MG TOTAL) BY MOUTH 3 TIMES DAILY BEFORE MEALS.    Marland Kitchen phenazopyridine (PYRIDIUM) 100 MG tablet 1 po qd prn burning (Patient not taking: Reported on 03/02/2020)    . pravastatin (PRAVACHOL) 20 MG tablet TAKE ONE TABLET BY MOUTH ONCE DAILY 90 tablet 1  . Semaglutide,0.25 or 0.5MG /DOS, (OZEMPIC, 0.25 OR 0.5 MG/DOSE,) 2 MG/1.5ML SOPN Inject 0.5 mg into the skin once a week. 4.5 mL 3  . SYNTHROID 75 MCG tablet TAKE ONE TABLET BY MOUTH ONCE DAILY 90 tablet 0   No current facility-administered medications on file prior to visit.   Allergies  Allergen Reactions  . Atorvastatin Rash  .  Ezetimibe Itching   Family History  Problem Relation Age of Onset  . Heart disease Mother        heart attack  . Stroke Father        brain hemorrhage  . Diabetes Brother   .  Breast cancer Sister   . Cancer Sister        breast cancer    PE: BP 116/78   Pulse 65   Ht 5\' 1"  (1.549 m)   Wt 118 lb 8 oz (53.8 kg)   SpO2 98%   BMI 22.39 kg/m  Wt Readings from Last 3 Encounters:  03/27/20 118 lb 8 oz (53.8 kg)  03/02/20 122 lb 6.4 oz (55.5 kg)  12/28/19 122 lb (55.3 kg)   Constitutional: normal weight, in NAD Eyes: PERRLA, EOMI, no exophthalmos ENT: moist mucous membranes, no thyromegaly, no cervical lymphadenopathy Cardiovascular: RRR, No MRG, + B LE edema Respiratory: CTA B Gastrointestinal: abdomen soft, NT, ND, BS+ Musculoskeletal: no deformities, strength intact in all 4 Skin: moist, warm, no rashes Neurological: no tremor with outstretched hands, DTR normal in all 4  ASSESSMENT: 1. DM2, insulin-dependent, uncontrolled, with long-term complications - CKD now stage 4 - PN  No personal history of pancreatitis or family history of medullary thyroid cancer.  2.  Hypothyroidism  PLAN:  1. DM2 Patient with longstanding, uncontrolled, type 2 diabetes, on injectable antidiabetic regimen with weekly GLP-1 receptor agonist added at last visit and also mealtime insulin.  At last visit, HbA1c was better, at 6.3%.  At that time, sugars are mostly at goal in the morning but they were increasing after breakfast.  Therefore, I suggested to change from Januvia to Ozempic.  Along with this change, I also advised her to decrease the NovoLog doses.  Of note, we are limited in the medication choices by her kidney disease. -At today's visit, sugars remain mostly at goal, with few exceptions.  She had slightly higher blood sugars at times before dinner, when she ate snacks in the afternoon, but she also has few instances of lower blood sugars after dinner.  We discussed that with the smaller dinner, she may need to use lower doses of NovoLog insulin, all 4 units.  As of now, she is varying between 5 and 6 units.  To address the higher blood sugars, up to the 180s, we  discussed that for meals containing more carbs, she may need to inject a slightly higher dose of insulin, maybe 7 or 8 units.  Otherwise, I do not feel she needs any changes in her regimen. - I suggested to:  Patient Instructions  Please continue: - Ozempic 0.5 mg weekly in a.m. - Novolog 3x a day, before meals:  4-8 units before breakfast 4-8 units before dinner  Please continue Unithroid 75 mcg daily: take this every day, with water, at least 30 minutes before breakfast, separated by at least 4 hours from: - acid reflux medications - calcium - iron - multivitamins  Please return in 3-4 months with your sugar log.   - we checked her HbA1c: 6.6% (slightly hgher) - advised to check sugars at different times of the day - 3x a day, rotating check times - advised for yearly eye exams >> she is UTD - return to clinic in 3-4 months  2. Hypothyroidism - latest thyroid labs reviewed with pt >> normal: Lab Results  Component Value Date   TSH 0.57 12/28/2019   - she continues on Unithroid d.a.w. 75 mcg daily - pt  feels good on this dose. - we discussed about taking the thyroid hormone every day, with water, >30 minutes before breakfast, separated by >4 hours from acid reflux medications, calcium, iron, multivitamins. Pt. is taking it correctly.  We did discuss that if she starts back on PPIs (Carafate is not helping her reflux much), she needs to take the first dose at least 4 hours after Unithroid, which she takes around 6:30 in the morning)   Philemon Kingdom, MD PhD [facility] Endocrinology

## 2020-03-29 DIAGNOSIS — R300 Dysuria: Secondary | ICD-10-CM | POA: Diagnosis not present

## 2020-03-29 DIAGNOSIS — N390 Urinary tract infection, site not specified: Secondary | ICD-10-CM | POA: Diagnosis not present

## 2020-03-31 ENCOUNTER — Other Ambulatory Visit: Payer: Self-pay | Admitting: Hematology and Oncology

## 2020-03-31 DIAGNOSIS — D539 Nutritional anemia, unspecified: Secondary | ICD-10-CM

## 2020-04-03 ENCOUNTER — Other Ambulatory Visit: Payer: Self-pay

## 2020-04-03 ENCOUNTER — Inpatient Hospital Stay: Payer: Medicare Other | Attending: Hematology and Oncology

## 2020-04-03 ENCOUNTER — Inpatient Hospital Stay: Payer: Medicare Other

## 2020-04-03 VITALS — BP 140/64 | HR 70 | Resp 17

## 2020-04-03 DIAGNOSIS — E538 Deficiency of other specified B group vitamins: Secondary | ICD-10-CM

## 2020-04-03 DIAGNOSIS — N184 Chronic kidney disease, stage 4 (severe): Secondary | ICD-10-CM | POA: Diagnosis not present

## 2020-04-03 DIAGNOSIS — N183 Chronic kidney disease, stage 3 unspecified: Secondary | ICD-10-CM

## 2020-04-03 DIAGNOSIS — I129 Hypertensive chronic kidney disease with stage 1 through stage 4 chronic kidney disease, or unspecified chronic kidney disease: Secondary | ICD-10-CM | POA: Diagnosis not present

## 2020-04-03 DIAGNOSIS — Z794 Long term (current) use of insulin: Secondary | ICD-10-CM | POA: Diagnosis not present

## 2020-04-03 DIAGNOSIS — E039 Hypothyroidism, unspecified: Secondary | ICD-10-CM | POA: Diagnosis not present

## 2020-04-03 DIAGNOSIS — D539 Nutritional anemia, unspecified: Secondary | ICD-10-CM

## 2020-04-03 DIAGNOSIS — D631 Anemia in chronic kidney disease: Secondary | ICD-10-CM | POA: Diagnosis not present

## 2020-04-03 DIAGNOSIS — E1122 Type 2 diabetes mellitus with diabetic chronic kidney disease: Secondary | ICD-10-CM | POA: Diagnosis not present

## 2020-04-03 DIAGNOSIS — N189 Chronic kidney disease, unspecified: Secondary | ICD-10-CM

## 2020-04-03 LAB — CBC WITH DIFFERENTIAL/PLATELET
Abs Immature Granulocytes: 0.01 10*3/uL (ref 0.00–0.07)
Basophils Absolute: 0 10*3/uL (ref 0.0–0.1)
Basophils Relative: 0 %
Eosinophils Absolute: 0.4 10*3/uL (ref 0.0–0.5)
Eosinophils Relative: 6 %
HCT: 34 % — ABNORMAL LOW (ref 36.0–46.0)
Hemoglobin: 10.5 g/dL — ABNORMAL LOW (ref 12.0–15.0)
Immature Granulocytes: 0 %
Lymphocytes Relative: 30 %
Lymphs Abs: 2.1 10*3/uL (ref 0.7–4.0)
MCH: 27.1 pg (ref 26.0–34.0)
MCHC: 30.9 g/dL (ref 30.0–36.0)
MCV: 87.6 fL (ref 80.0–100.0)
Monocytes Absolute: 0.5 10*3/uL (ref 0.1–1.0)
Monocytes Relative: 7 %
Neutro Abs: 3.9 10*3/uL (ref 1.7–7.7)
Neutrophils Relative %: 57 %
Platelets: 204 10*3/uL (ref 150–400)
RBC: 3.88 MIL/uL (ref 3.87–5.11)
RDW: 14.9 % (ref 11.5–15.5)
WBC: 6.9 10*3/uL (ref 4.0–10.5)
nRBC: 0 % (ref 0.0–0.2)

## 2020-04-03 LAB — FERRITIN: Ferritin: 490 ng/mL — ABNORMAL HIGH (ref 11–307)

## 2020-04-03 LAB — IRON AND TIBC
Iron: 88 ug/dL (ref 41–142)
Saturation Ratios: 38 % (ref 21–57)
TIBC: 229 ug/dL — ABNORMAL LOW (ref 236–444)
UIBC: 141 ug/dL (ref 120–384)

## 2020-04-03 LAB — VITAMIN B12: Vitamin B-12: 1198 pg/mL — ABNORMAL HIGH (ref 180–914)

## 2020-04-03 MED ORDER — CYANOCOBALAMIN 1000 MCG/ML IJ SOLN
INTRAMUSCULAR | Status: AC
  Filled 2020-04-03: qty 1

## 2020-04-03 MED ORDER — CYANOCOBALAMIN 1000 MCG/ML IJ SOLN
1000.0000 ug | Freq: Once | INTRAMUSCULAR | Status: AC
  Administered 2020-04-03: 1000 ug via INTRAMUSCULAR

## 2020-04-03 MED ORDER — DARBEPOETIN ALFA 200 MCG/0.4ML IJ SOSY
200.0000 ug | PREFILLED_SYRINGE | Freq: Once | INTRAMUSCULAR | Status: AC
  Administered 2020-04-03: 200 ug via SUBCUTANEOUS

## 2020-04-03 MED ORDER — DARBEPOETIN ALFA 200 MCG/0.4ML IJ SOSY
PREFILLED_SYRINGE | INTRAMUSCULAR | Status: AC
  Filled 2020-04-03: qty 0.4

## 2020-04-03 NOTE — Patient Instructions (Signed)
Darbepoetin Alfa injection What is this medicine? DARBEPOETIN ALFA (dar be POE e tin AL fa) helps your body make more red blood cells. It is used to treat anemia caused by chronic kidney failure and chemotherapy. This medicine may be used for other purposes; ask your health care provider or pharmacist if you have questions. COMMON BRAND NAME(S): Aranesp What should I tell my health care provider before I take this medicine? They need to know if you have any of these conditions:  blood clotting disorders or history of blood clots  cancer patient not on chemotherapy  cystic fibrosis  heart disease, such as angina, heart failure, or a history of a heart attack  hemoglobin level of 12 g/dL or greater  high blood pressure  low levels of folate, iron, or vitamin B12  seizures  an unusual or allergic reaction to darbepoetin, erythropoietin, albumin, hamster proteins, latex, other medicines, foods, dyes, or preservatives  pregnant or trying to get pregnant  breast-feeding How should I use this medicine? This medicine is for injection into a vein or under the skin. It is usually given by a health care professional in a hospital or clinic setting. If you get this medicine at home, you will be taught how to prepare and give this medicine. Use exactly as directed. Take your medicine at regular intervals. Do not take your medicine more often than directed. It is important that you put your used needles and syringes in a special sharps container. Do not put them in a trash can. If you do not have a sharps container, call your pharmacist or healthcare provider to get one. A special MedGuide will be given to you by the pharmacist with each prescription and refill. Be sure to read this information carefully each time. Talk to your pediatrician regarding the use of this medicine in children. While this medicine may be used in children as young as 1 month of age for selected conditions, precautions do  apply. Overdosage: If you think you have taken too much of this medicine contact a poison control center or emergency room at once. NOTE: This medicine is only for you. Do not share this medicine with others. What if I miss a dose? If you miss a dose, take it as soon as you can. If it is almost time for your next dose, take only that dose. Do not take double or extra doses. What may interact with this medicine? Do not take this medicine with any of the following medications:  epoetin alfa This list may not describe all possible interactions. Give your health care provider a list of all the medicines, herbs, non-prescription drugs, or dietary supplements you use. Also tell them if you smoke, drink alcohol, or use illegal drugs. Some items may interact with your medicine. What should I watch for while using this medicine? Your condition will be monitored carefully while you are receiving this medicine. You may need blood work done while you are taking this medicine. This medicine may cause a decrease in vitamin B6. You should make sure that you get enough vitamin B6 while you are taking this medicine. Discuss the foods you eat and the vitamins you take with your health care professional. What side effects may I notice from receiving this medicine? Side effects that you should report to your doctor or health care professional as soon as possible:  allergic reactions like skin rash, itching or hives, swelling of the face, lips, or tongue  breathing problems  changes in   vision  chest pain  confusion, trouble speaking or understanding  feeling faint or lightheaded, falls  high blood pressure  muscle aches or pains  pain, swelling, warmth in the leg  rapid weight gain  severe headaches  sudden numbness or weakness of the face, arm or leg  trouble walking, dizziness, loss of balance or coordination  seizures (convulsions)  swelling of the ankles, feet, hands  unusually weak or  tired Side effects that usually do not require medical attention (report to your doctor or health care professional if they continue or are bothersome):  diarrhea  fever, chills (flu-like symptoms)  headaches  nausea, vomiting  redness, stinging, or swelling at site where injected This list may not describe all possible side effects. Call your doctor for medical advice about side effects. You may report side effects to FDA at 1-800-FDA-1088. Where should I keep my medicine? Keep out of the reach of children. Store in a refrigerator between 2 and 8 degrees C (36 and 46 degrees F). Do not freeze. Do not shake. Throw away any unused portion if using a single-dose vial. Throw away any unused medicine after the expiration date. NOTE: This sheet is a summary. It may not cover all possible information. If you have questions about this medicine, talk to your doctor, pharmacist, or health care provider.  2021 Elsevier/Gold Standard (2017-03-26 16:44:20)  Cyanocobalamin, Vitamin B12 injection What is this medicine? CYANOCOBALAMIN (sye an oh koe BAL a min) is a man made form of vitamin B12. Vitamin B12 is used in the growth of healthy blood cells, nerve cells, and proteins in the body. It also helps with the metabolism of fats and carbohydrates. This medicine is used to treat people who can not absorb vitamin B12. This medicine may be used for other purposes; ask your health care provider or pharmacist if you have questions. COMMON BRAND NAME(S): B-12 Compliance Kit, B-12 Injection Kit, Cyomin, LA-12, Nutri-Twelve, Physicians EZ Use B-12, Primabalt What should I tell my health care provider before I take this medicine? They need to know if you have any of these conditions:  kidney disease  Leber's disease  megaloblastic anemia  an unusual or allergic reaction to cyanocobalamin, cobalt, other medicines, foods, dyes, or preservatives  pregnant or trying to get pregnant  breast-feeding How  should I use this medicine? This medicine is injected into a muscle or deeply under the skin. It is usually given by a health care professional in a clinic or doctor's office. However, your doctor may teach you how to inject yourself. Follow all instructions. Talk to your pediatrician regarding the use of this medicine in children. Special care may be needed. Overdosage: If you think you have taken too much of this medicine contact a poison control center or emergency room at once. NOTE: This medicine is only for you. Do not share this medicine with others. What if I miss a dose? If you are given your dose at a clinic or doctor's office, call to reschedule your appointment. If you give your own injections and you miss a dose, take it as soon as you can. If it is almost time for your next dose, take only that dose. Do not take double or extra doses. What may interact with this medicine?  colchicine  heavy alcohol intake This list may not describe all possible interactions. Give your health care provider a list of all the medicines, herbs, non-prescription drugs, or dietary supplements you use. Also tell them if you smoke, drink   alcohol, or use illegal drugs. Some items may interact with your medicine. What should I watch for while using this medicine? Visit your doctor or health care professional regularly. You may need blood work done while you are taking this medicine. You may need to follow a special diet. Talk to your doctor. Limit your alcohol intake and avoid smoking to get the best benefit. What side effects may I notice from receiving this medicine? Side effects that you should report to your doctor or health care professional as soon as possible:  allergic reactions like skin rash, itching or hives, swelling of the face, lips, or tongue  blue tint to skin  chest tightness, pain  difficulty breathing, wheezing  dizziness  red, swollen painful area on the leg Side effects that  usually do not require medical attention (report to your doctor or health care professional if they continue or are bothersome):  diarrhea  headache This list may not describe all possible side effects. Call your doctor for medical advice about side effects. You may report side effects to FDA at 1-800-FDA-1088. Where should I keep my medicine? Keep out of the reach of children. Store at room temperature between 15 and 30 degrees C (59 and 85 degrees F). Protect from light. Throw away any unused medicine after the expiration date. NOTE: This sheet is a summary. It may not cover all possible information. If you have questions about this medicine, talk to your doctor, pharmacist, or health care provider.  2021 Elsevier/Gold Standard (2007-06-22 22:10:20)   

## 2020-04-05 ENCOUNTER — Telehealth: Payer: Self-pay | Admitting: Pharmacist

## 2020-04-11 NOTE — Telephone Encounter (Signed)
Submitted Patient Assistance Application to [employer] for [place] along with provider portion and income documents. Submitted signed patient forms as well.  Fax# [phone] Phone# [phone]  Knox Saliva, PharmD, MPH Clinical Pharmacist (Rheumatology and Pulmonology)

## 2020-04-12 ENCOUNTER — Telehealth: Payer: Self-pay

## 2020-04-12 ENCOUNTER — Telehealth: Payer: Self-pay | Admitting: Internal Medicine

## 2020-04-12 DIAGNOSIS — N184 Chronic kidney disease, stage 4 (severe): Secondary | ICD-10-CM

## 2020-04-12 DIAGNOSIS — E1122 Type 2 diabetes mellitus with diabetic chronic kidney disease: Secondary | ICD-10-CM

## 2020-04-12 NOTE — Telephone Encounter (Signed)
Patient requests to be called at ph# [phone] re: she does not think Ozempic is helping her.

## 2020-04-12 NOTE — Telephone Encounter (Signed)
Returned call and discussed that her Forteo application is pending. It was faxed in yesterday. Will let her know when we receive a response.  Patient's copay through insurance is $1,175 for 1 month supply due to her deductible. Patient will wait for PAP determination.  Updates will be in previous encounter.

## 2020-04-12 NOTE — Telephone Encounter (Signed)
Patient left a voicemail requesting a return call regarding her Forteo medication.

## 2020-04-13 NOTE — Telephone Encounter (Signed)
If she is absolutely sure about this, we can definitely take Ozempic of her medication list and start a 90-day supply of Januvia instead.  She has been on this in the past.

## 2020-04-13 NOTE — Telephone Encounter (Signed)
Pt advised Ozempic has been raising her blood sugars really high and would like to stop Ozempic. Instead pt would like to try Januvia if you think it is a good alternative.

## 2020-04-14 NOTE — Addendum Note (Signed)
Addended by: Lauralyn Primes on: 04/14/2020 04:55 PM   Modules accepted: Orders

## 2020-04-14 NOTE — Telephone Encounter (Signed)
Abigail Hoover is calling back about the Januvia and Ozempic situation - looks liked Dr Darnell Level has answered   Call back # [phone]

## 2020-04-14 NOTE — Telephone Encounter (Signed)
Confirmed with pt she is ok with switching prescriptions.

## 2020-04-15 ENCOUNTER — Other Ambulatory Visit: Payer: Self-pay | Admitting: Internal Medicine

## 2020-04-15 MED ORDER — SITAGLIPTIN PHOSPHATE 25 MG PO TABS: 25.0000 mg | ORAL_TABLET | Freq: Every day | ORAL | 3 refills | Status: DC

## 2020-04-15 NOTE — Telephone Encounter (Signed)
I sent Abigail Hoover.

## 2020-04-17 NOTE — Telephone Encounter (Signed)
Patient called stating her Abigail Hoover is over $1000.00. Please advise.

## 2020-04-18 NOTE — Telephone Encounter (Signed)
Per [employer], they did not receive application that was faxed on 04/11/20 (we received confirmation fax that it was received at correct program fax number).  Re-faxed application - expected turnaround time is 7-10 days.  Program states that they cannot expedite her application review despite her being established on therapy and faxing issues. Will f/u

## 2020-04-19 DIAGNOSIS — L2089 Other atopic dermatitis: Secondary | ICD-10-CM | POA: Diagnosis not present

## 2020-04-25 NOTE — Telephone Encounter (Signed)
Called [employer] for Forteo PAP enrollment application update. They state they faxed denial letter on 04/19/20 - we havne't received yet. Requested a re-fax of denial letter.  Rep states that patient exceeds max of $69,090. Could submit 2-3 months of 2021 bank statements - if they show that she doesn't exceed this maximum, then she move forward with financial hardship letter  Can also submit 2021 tax return - but unlikely to have completed yet.  Can also submit financial hardship letter - per rep, either patient or provider can submit this.  Knox Saliva, PharmD, MPH Clinical Pharmacist (Rheumatology and Pulmonology)

## 2020-04-26 ENCOUNTER — Telehealth: Payer: Medicare Other

## 2020-04-26 NOTE — Telephone Encounter (Signed)
Submitted a Prior Authorization request to [employer] for [place] via Cover My Meds. Will update once we receive a response.   Key: [id number]

## 2020-04-26 NOTE — Telephone Encounter (Signed)
Called patient to notify about [employer] denial. I spoke with Ms. Crossland and her husband who were both upset about the denial - they had not yet received denial letter in mail.  I discussed options provided by [employer] rep: submit 2021 bank statements or submit financial hardship letter. I advised patient that I can write this letter but patient was unable to provide costs for medical care.. She states that neither her nor her husband would know.  They requested that I reach out to their agent, Marinda Elk, at [phone].  Medicaid eligibility search shows that patient does not have active Medicaid. She has active [employer].  Patient has not taken Forteo for 3 weeks. She started Forteo on 09/29/19.  Patient's CrCl is <30 ml/min - therefore bisphosphonates and Reclast would not be recommended.  Tymlos does not have any renal dose adjustment but copay may be just as high.  After discussion with Dr. Estanislado Pandy, we will try for Prolia. Patient will require close monitoring after injection d/t risk of hypocalcemia.  Knox Saliva, PharmD, MPH Clinical Pharmacist (Rheumatology and Pulmonology)

## 2020-04-26 NOTE — Telephone Encounter (Signed)
Addressed in previous encounter.

## 2020-04-26 NOTE — Telephone Encounter (Signed)
Patient called requesting a return call to discuss her Forteo application.

## 2020-04-27 ENCOUNTER — Telehealth: Payer: Self-pay

## 2020-04-27 NOTE — Telephone Encounter (Signed)
Returned patient's call. I discussed Prolia with her briefly. Discussed that we will try financial hardship letter on her behalf but if company denies her again, we will have to move forward with Prolia. Discussed that because of her renal function, we are limited in osteoporosis therapies.  Knox Saliva, PharmD, MPH Clinical Pharmacist (Rheumatology and Pulmonology)

## 2020-04-27 NOTE — Telephone Encounter (Signed)
Patient called stating "she needed to speak with the pharmacist" and requested a return call.

## 2020-04-27 NOTE — Telephone Encounter (Signed)
PA required. No co-pay with [employer].  20% co-insurance.

## 2020-04-27 NOTE — Telephone Encounter (Signed)
Received notification from [facility] regarding a prior authorization for [place]. Authorization has been APPROVED from 04/26/20 to 04/26/21.   Authorization # [id number]  Patient's copay through Pharmacy benefit is $270.00.  Will check patient's [facility] Medicare Medical benefit coverage - [phone]

## 2020-04-28 ENCOUNTER — Telehealth: Payer: Self-pay

## 2020-04-28 NOTE — Telephone Encounter (Signed)
Abigail Hoover from [place] left a voicemail yesterday, 04/27/20 at 6:33 pm stating patient's Prolia is covered for 1 year starting 04/26/20.  Patient has been notified and will receive a letter in the mail for records.  If there is any questions, contact the provider line [phone].

## 2020-04-28 NOTE — Telephone Encounter (Addendum)
Called [employer] member services with patient using Hindi interpreter (Rashmi and Harish)   Out of pocket medical cost for 2021 was $2,254.50 and for prescriptions was $8744.99. Patient had back surgery in 2021 and have advised her to collect any medical-related bills this weekend to submit to company with letter. She will look this weekend and call clinic on Monday. Advised that it's in her benefit to drop off these bills to provide to the company but since transportation is an issue, she may be unable to do so.  Knox Saliva, PharmD, MPH Clinical Pharmacist (Rheumatology and Pulmonology)

## 2020-05-01 ENCOUNTER — Other Ambulatory Visit: Payer: Self-pay | Admitting: General Surgery

## 2020-05-01 DIAGNOSIS — Z1231 Encounter for screening mammogram for malignant neoplasm of breast: Secondary | ICD-10-CM

## 2020-05-01 NOTE — Telephone Encounter (Signed)
Patient plans to fax income documents including surgery bills, prescriptions costs to clinic so we can submit with Forteo PAP denial appeal.  Patient provided with clinic fax number.

## 2020-05-02 ENCOUNTER — Telehealth: Payer: Self-pay

## 2020-05-02 ENCOUNTER — Ambulatory Visit: Payer: Medicare Other | Admitting: Rheumatology

## 2020-05-02 NOTE — Telephone Encounter (Signed)
Patient left a voicemail requesting to speak with Devki.

## 2020-05-02 NOTE — Telephone Encounter (Signed)
Returned patient's call. She states she received call from [facility] about [place] and stated it's been reduced to 20%. Advised that the call was about Prolia which we ran BIV for last week. She plans to fax income documents tomorrow.  Knox Saliva, PharmD, MPH Clinical Pharmacist (Rheumatology and Pulmonology)

## 2020-05-03 NOTE — Telephone Encounter (Signed)
Patient's nephew dropped off medical documentation from 2021 including costs of care. Sent re-consideration letter and documents to [employer]. Will f/u on Friday to check status.   Fax# [phone] Phone# [phone]

## 2020-05-04 ENCOUNTER — Inpatient Hospital Stay: Payer: Medicare Other | Admitting: Hematology and Oncology

## 2020-05-04 ENCOUNTER — Inpatient Hospital Stay: Payer: Medicare Other

## 2020-05-05 ENCOUNTER — Other Ambulatory Visit: Payer: Self-pay | Admitting: Rheumatology

## 2020-05-05 NOTE — Telephone Encounter (Signed)
Last Visit: 03/02/2020 Next Visit: 08/30/2020 Labs: 04/03/2020 Hgb 10.5, Hct 34.0  Current Dose per office note 03/02/2020: colchicine 0.3mg  by mouth daily.  DX: Hyperuricemia  Last Fill: 02/03/2020  Okay to refill Colchicine?

## 2020-05-08 DIAGNOSIS — N1832 Chronic kidney disease, stage 3b: Secondary | ICD-10-CM | POA: Diagnosis not present

## 2020-05-09 ENCOUNTER — Other Ambulatory Visit: Payer: Medicare Other

## 2020-05-09 ENCOUNTER — Ambulatory Visit: Payer: Medicare Other | Admitting: Hematology and Oncology

## 2020-05-09 ENCOUNTER — Ambulatory Visit: Payer: Medicare Other

## 2020-05-09 ENCOUNTER — Telehealth: Payer: Self-pay | Admitting: Rheumatology

## 2020-05-09 NOTE — Telephone Encounter (Signed)
Re-faxed documents to [employer]. Marked as urgent and also noted/attached confirmation page from original fax date.

## 2020-05-09 NOTE — Telephone Encounter (Signed)
Patient requesting a call back to discuss Forteo. Per patient, she was told it will cost her $1000.00 for the rx. Patient cannot afford this. Please call to advise.

## 2020-05-09 NOTE — Telephone Encounter (Signed)
Called [employer] to check status of patient's re-consideration. Rep advised the last fax they received was on 04/26/20, they did not receive fax from 05/03/20. Requested documents be re-faxed to program.  PRFFM#[phone]

## 2020-05-09 NOTE — Telephone Encounter (Signed)
Returned patient's call and advised that [employer] is still processing application. Have marked as urgent. Advised her that we will follow up as soon as we hear back.

## 2020-05-10 ENCOUNTER — Encounter: Payer: Self-pay | Admitting: Adult Health

## 2020-05-10 ENCOUNTER — Other Ambulatory Visit: Payer: Self-pay

## 2020-05-10 ENCOUNTER — Ambulatory Visit: Payer: Medicare Other | Admitting: Adult Health

## 2020-05-10 ENCOUNTER — Telehealth: Payer: Self-pay | Admitting: *Deleted

## 2020-05-10 VITALS — BP 95/52 | HR 56 | Ht 61.0 in | Wt 125.0 lb

## 2020-05-10 DIAGNOSIS — G8928 Other chronic postprocedural pain: Secondary | ICD-10-CM | POA: Diagnosis not present

## 2020-05-10 DIAGNOSIS — Z9889 Other specified postprocedural states: Secondary | ICD-10-CM

## 2020-05-10 DIAGNOSIS — G3184 Mild cognitive impairment, so stated: Secondary | ICD-10-CM | POA: Diagnosis not present

## 2020-05-10 DIAGNOSIS — M542 Cervicalgia: Secondary | ICD-10-CM | POA: Diagnosis not present

## 2020-05-10 MED FILL — FORTEO 600 MCG/2.4 ML PEN I: 620 | 28 days supply | Qty: 2 | Fill #1

## 2020-05-10 MED FILL — UNIFINE PENTIPS 31GX3/16: 31G X 5 MM | 90 days supply | Qty: 100 | Fill #2

## 2020-05-10 NOTE — Telephone Encounter (Signed)
Spoke with patient's nephew and patient. They will pay the $1000 copay for Forteo - advised that it could be picked up from pharmacy on Friday, 05/12/20 after 12pm since it has to be ordered from company.  Provided pharmacy name and address to nephew to pick up.  Patient advised that generic [place] is $600 which would be cheaper, however she states that her daughter who is a pharmacist advised not to take generic. We briefly discussed however she'd like to get brand-name only.  Knox Saliva, PharmD, MPH Clinical Pharmacist (Rheumatology and Pulmonology)

## 2020-05-10 NOTE — Progress Notes (Signed)
I agree with the above plan 

## 2020-05-10 NOTE — Telephone Encounter (Signed)
Patient's husband contacted the office stating they need a refill on patient's Forteo. Advised patient's husband that he could contact the pharmacy as she would have additional refills. He states that there are no refills at the pharmacy. Patient's husband demanded to have the prescription today. I advised him I would call the pharmacy and then call him back. I spoke with Chastity and she states there are refills and they would get the prescription ready for the patient. She states the co-pay would be $1,000. Patient advised.
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
## 2020-05-10 NOTE — Patient Instructions (Signed)
Your Plan:  Highly recommend doing neck exercises and moist heat as your symptoms are likely more muscle related  If symptoms persist or worsen, would recommend further evaluation with neurosurgery       Thank you for coming to see [place] at [facility]. I hope we have been able to provide you high quality care today.  You may receive a patient satisfaction survey over the next few weeks. We would appreciate your feedback and comments so that we may continue to improve ourselves and the health of our patients.      Neck Exercises Ask your health care provider which exercises are safe for you. Do exercises exactly as told by your health care provider and adjust them as directed. It is normal to feel mild stretching, pulling, tightness, or discomfort as you do these exercises. Stop right away if you feel sudden pain or your pain gets worse. Do not begin these exercises until told by your health care provider. Neck exercises can be important for many reasons. They can improve strength and maintain flexibility in your neck, which will help your upper back and prevent neck pain. Stretching exercises Rotation neck stretching 1. Sit in a chair or stand up. 2. Place your feet flat on the floor, shoulder width apart. 3. Slowly turn your head (rotate) to the right until a slight stretch is felt. Turn it all the way to the right so you can look over your right shoulder. Do not tilt or tip your head. 4. Hold this position for 10-30 seconds. 5. Slowly turn your head (rotate) to the left until a slight stretch is felt. Turn it all the way to the left so you can look over your left shoulder. Do not tilt or tip your head. 6. Hold this position for 10-30 seconds. Repeat __________ times. Complete this exercise __________ times a day.   Neck retraction 1. Sit in a sturdy chair or stand up. 2. Look straight ahead. Do not bend your neck. 3. Use your fingers to push your chin backward  (retraction). Do not bend your neck for this movement. Continue to face straight ahead. If you are doing the exercise properly, you will feel a slight sensation in your throat and a stretch at the back of your neck. 4. Hold the stretch for 1-2 seconds. Repeat __________ times. Complete this exercise __________ times a day. Strengthening exercises Neck press 1. Lie on your back on a firm bed or on the floor with a pillow under your head. 2. Use your neck muscles to push your head down on the pillow and straighten your spine. 3. Hold the position as well as you can. Keep your head facing up (in a neutral position) and your chin tucked. 4. Slowly count to 5 while holding this position. Repeat __________ times. Complete this exercise __________ times a day. Isometrics These are exercises in which you strengthen the muscles in your neck while keeping your neck still (isometrics). 1. Sit in a supportive chair and place your hand on your forehead. 2. Keep your head and face facing straight ahead. Do not flex or extend your neck while doing isometrics. 3. Push forward with your head and neck while pushing back with your hand. Hold for 10 seconds. 4. Do the sequence again, this time putting your hand against the back of your head. Use your head and neck to push backward against the hand pressure. 5. Finally, do the same exercise on either side of your head, pushing  sideways against the pressure of your hand. Repeat __________ times. Complete this exercise __________ times a day. Prone head lifts 1. Lie face-down (prone position), resting on your elbows so that your chest and upper back are raised. 2. Start with your head facing downward, near your chest. Position your chin either on or near your chest. 3. Slowly lift your head upward. Lift until you are looking straight ahead. Then continue lifting your head as far back as you can comfortably stretch. 4. Hold your head up for 5 seconds. Then slowly  lower it to your starting position. Repeat __________ times. Complete this exercise __________ times a day. Supine head lifts 1. Lie on your back (supine position), bending your knees to point to the ceiling and keeping your feet flat on the floor. 2. Lift your head slowly off the floor, raising your chin toward your chest. 3. Hold for 5 seconds. Repeat __________ times. Complete this exercise __________ times a day. Scapular retraction 1. Stand with your arms at your sides. Look straight ahead. 2. Slowly pull both shoulders (scapulae) backward and downward (retraction) until you feel a stretch between your shoulder blades in your upper back. 3. Hold for 10-30 seconds. 4. Relax and repeat. Repeat __________ times. Complete this exercise __________ times a day. Contact a health care provider if:  Your neck pain or discomfort gets much worse when you do an exercise.  Your neck pain or discomfort does not improve within 2 hours after you exercise. If you have any of these problems, stop exercising right away. Do not do the exercises again unless your health care provider says that you can. Get help right away if:  You develop sudden, severe neck pain. If this happens, stop exercising right away. Do not do the exercises again unless your health care provider says that you can. This information is not intended to replace advice given to you by your health care provider. Make sure you discuss any questions you have with your health care provider. Document Revised: 01/07/2018 Document Reviewed: 01/07/2018 Elsevier Patient Education  2021 [employer].

## 2020-05-12 ENCOUNTER — Other Ambulatory Visit: Payer: Self-pay | Admitting: Hematology and Oncology

## 2020-05-12 ENCOUNTER — Inpatient Hospital Stay: Payer: Medicare Other | Attending: Hematology and Oncology

## 2020-05-12 ENCOUNTER — Inpatient Hospital Stay: Payer: Medicare Other

## 2020-05-12 ENCOUNTER — Inpatient Hospital Stay: Payer: Medicare Other | Admitting: Hematology and Oncology

## 2020-05-12 ENCOUNTER — Encounter: Payer: Self-pay | Admitting: Hematology and Oncology

## 2020-05-12 ENCOUNTER — Telehealth: Payer: Self-pay | Admitting: Hematology and Oncology

## 2020-05-12 ENCOUNTER — Other Ambulatory Visit: Payer: Self-pay

## 2020-05-12 DIAGNOSIS — Z79899 Other long term (current) drug therapy: Secondary | ICD-10-CM | POA: Diagnosis not present

## 2020-05-12 DIAGNOSIS — N189 Chronic kidney disease, unspecified: Secondary | ICD-10-CM | POA: Insufficient documentation

## 2020-05-12 DIAGNOSIS — Z803 Family history of malignant neoplasm of breast: Secondary | ICD-10-CM | POA: Insufficient documentation

## 2020-05-12 DIAGNOSIS — D631 Anemia in chronic kidney disease: Secondary | ICD-10-CM | POA: Diagnosis not present

## 2020-05-12 DIAGNOSIS — E538 Deficiency of other specified B group vitamins: Secondary | ICD-10-CM

## 2020-05-12 DIAGNOSIS — N183 Chronic kidney disease, stage 3 unspecified: Secondary | ICD-10-CM | POA: Diagnosis not present

## 2020-05-12 DIAGNOSIS — E611 Iron deficiency: Secondary | ICD-10-CM | POA: Insufficient documentation

## 2020-05-12 DIAGNOSIS — D539 Nutritional anemia, unspecified: Secondary | ICD-10-CM

## 2020-05-12 LAB — CBC WITH DIFFERENTIAL/PLATELET
Abs Immature Granulocytes: 0 10*3/uL (ref 0.00–0.07)
Basophils Absolute: 0 10*3/uL (ref 0.0–0.1)
Basophils Relative: 0 %
Eosinophils Absolute: 0.5 10*3/uL (ref 0.0–0.5)
Eosinophils Relative: 9 %
HCT: 33.9 % — ABNORMAL LOW (ref 36.0–46.0)
Hemoglobin: 10.3 g/dL — ABNORMAL LOW (ref 12.0–15.0)
Immature Granulocytes: 0 %
Lymphocytes Relative: 38 %
Lymphs Abs: 2.3 10*3/uL (ref 0.7–4.0)
MCH: 26.9 pg (ref 26.0–34.0)
MCHC: 30.4 g/dL (ref 30.0–36.0)
MCV: 88.5 fL (ref 80.0–100.0)
Monocytes Absolute: 0.5 10*3/uL (ref 0.1–1.0)
Monocytes Relative: 8 %
Neutro Abs: 2.7 10*3/uL (ref 1.7–7.7)
Neutrophils Relative %: 45 %
Platelets: 168 10*3/uL (ref 150–400)
RBC: 3.83 MIL/uL — ABNORMAL LOW (ref 3.87–5.11)
RDW: 15.3 % (ref 11.5–15.5)
WBC: 6 10*3/uL (ref 4.0–10.5)
nRBC: 0 % (ref 0.0–0.2)

## 2020-05-12 MED ORDER — DARBEPOETIN ALFA 200 MCG/0.4ML IJ SOSY
PREFILLED_SYRINGE | INTRAMUSCULAR | Status: AC
  Filled 2020-05-12: qty 0.4

## 2020-05-12 MED ORDER — DARBEPOETIN ALFA 200 MCG/0.4ML IJ SOSY
200.0000 ug | PREFILLED_SYRINGE | Freq: Once | INTRAMUSCULAR | Status: AC
  Administered 2020-05-12: 200 ug via SUBCUTANEOUS

## 2020-05-12 NOTE — Assessment & Plan Note (Signed)
She has history of vitamin B-12 deficiency Her recent B12 level is good We can probably space out her B12 injection every 3 months

## 2020-05-12 NOTE — Telephone Encounter (Signed)
Scheduled appts per 2/18 sch msg. Gave pt a printout of appt calendar

## 2020-05-12 NOTE — Assessment & Plan Note (Signed)
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

## 2020-05-12 NOTE — Assessment & Plan Note (Addendum)
She has significant concern about risk of breast cancer Her breast examination is benign She is reassured She will continue screening mammogram

## 2020-05-12 NOTE — Progress Notes (Signed)
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   The total time spent in the appointment was 20 minutes encounter with patients including review of chart and various tests results, discussions about plan of care and coordination of care plan   All questions were answered. The patient knows to call the clinic with any problems, questions or concerns. No barriers to learning was detected.    Abigail Lark, MD 2/18/20222:47 PM  INTERVAL HISTORY: Abigail Hoover 75 y.o. female returns for further follow-up on anemia of chronic kidney disease She is doing well She denies symptoms of anemia such as chest pain or shortness of breath She is concerned about breast cancer and would like me to examine her She denies any recent abnormal breast examination, palpable mass, abnormal breast appearance or nipple changes  SUMMARY OF HEMATOLOGIC HISTORY:  This is a patient that has been followed here since 2011 for chronic anemia In 2011, she had a bone marrow aspirate and biopsy that came back normal. The patient has been receiving Aranesp injection for chronic anemia up until 2014. It was subsequently discontinued due stability of the anemia. Starting 05/27/2014,  darbepoetin was resumed at 200 g every other month to keep hemoglobin greater than 10 g. Treatment was discontinued in October 2016 and resumed in September 2017 The patient is subsequently found to have severe vitamin B12 deficiency and is started on vitamin B12 injection on December 25, 2016 along with resumption of darbepoetin injection every 5 to 6 weeks.  She is also seen due to family history of breast cancer She also required intravenous iron infusion intermittently for concurrent iron deficiency anemia  I have reviewed the past medical history, past surgical history, social history and family history with the patient and they are unchanged from previous note.  ALLERGIES:  is allergic to atorvastatin and ezetimibe.  MEDICATIONS:  Current Outpatient Medications  Medication Sig Dispense Refill  . aspirin EC 81 MG tablet Take 81 mg by mouth daily. Take 1 tablet by mouth once daily.    Marland Kitchen CARAFATE 1 GM/10ML suspension TAKE 10ML BY MOUTH 4 (FOUR) TIMES A DAY BETWEEN MEALS AND AT BEDTIME    . colchicine 0.6 MG tablet TAKE ONE-HALF TABLETS (0.3 MG TOTAL) BY MOUTH DAILY. 15 tablet 2  . FORTEO 620 MCG/2.48ML SOPN INJECT 20 MCG INTO THE SKIN DAILY. DISCARD AFTER 28 DAYS. 2.4 mL 5  . furosemide (LASIX) 40 MG tablet Take 40 mg by mouth. Take 1 tablet by mouth three times per week.    . gabapentin (NEURONTIN) 300 MG capsule Take by mouth.    Marland Kitchen glucose blood (ONETOUCH VERIO) test strip Use Onetouch verio test strips to check blood sugar three times daily. (90 day RX) 300 each 3  . Insulin Pen Needle (PEN NEEDLES 3/16")  31G X 5 MM MISC Use 1 pen needle to inject Forteo daily. 100 each 2  . Insulin Pen Needle 31G X 5 MM MISC Use with Novolog 100 each 1  . Insulin Syringe-Needle U-100 25G X 5/8" 1 ML MISC USE DAILY TO INJECT INSULIN 100 each 3  . Lancet Devices (ONE TOUCH DELICA LANCING DEV) MISC 1 each by Does not apply route daily. Use as instructed to check blood sugar once daily. 1 each 0  . Lancets  (ONETOUCH DELICA PLUS [id number]) MISC TEST BLOOD SUGAR ONCE DAILY 100 each 2  . metoprolol succinate (TOPROL-XL) 50 MG 24 hr tablet Take 1 tablet (50 mg total) by mouth daily. Take with or immediately following a meal. 30 tablet 3  . mirabegron ER (MYRBETRIQ) 50 MG TB24 tablet Take by mouth.    Marland Kitchen NOVOLOG FLEXPEN 100 UNIT/ML FlexPen Inject 4-11 Units into the skin 2 (two) times daily before a meal. 30 mL 3  . pantoprazole (PROTONIX) 40 MG tablet Take 40 mg by mouth daily.    . pentosan polysulfate (ELMIRON) 100 MG capsule TAKE 1 CAPSULE (100 MG TOTAL) BY MOUTH 3 TIMES DAILY BEFORE MEALS.    Marland Kitchen phenazopyridine (PYRIDIUM) 100 MG tablet     . pravastatin (PRAVACHOL) 20 MG tablet TAKE ONE TABLET BY MOUTH ONCE DAILY 90 tablet 1  . sitaGLIPtin (JANUVIA) 25 MG tablet Take 1 tablet (25 mg total) by mouth daily. 90 tablet 3  . SYNTHROID 75 MCG tablet TAKE ONE TABLET BY MOUTH ONCE DAILY 90 tablet 0   No current facility-administered medications for this visit.     REVIEW OF SYSTEMS:   Constitutional: Denies fevers, chills or night sweats Eyes: Denies blurriness of vision Ears, nose, mouth, throat, and face: Denies mucositis or sore throat Respiratory: Denies cough, dyspnea or wheezes Cardiovascular: Denies palpitation, chest discomfort or lower extremity swelling Gastrointestinal:  Denies nausea, heartburn or change in bowel habits Skin: Denies abnormal skin rashes Lymphatics: Denies new lymphadenopathy or easy bruising Neurological:Denies numbness, tingling or new weaknesses Behavioral/Psych: Mood is stable, no new changes  All other systems were reviewed with the patient and are negative.  PHYSICAL EXAMINATION: ECOG PERFORMANCE STATUS: 0 - Asymptomatic  Vitals:   05/12/20 1351  BP: (!) 115/48  Pulse: (!) 58  Resp: 18  Temp: (!) 97.5 F (36.4 C)  SpO2: 99%   Filed Weights   05/12/20 1351  Weight: 126 lb 9.6 oz (57.4 kg)    GENERAL:alert, no distress and comfortable SKIN: skin color,  texture, turgor are normal, no rashes or significant lesions EYES: normal, Conjunctiva are pink and non-injected, sclera clear OROPHARYNX:no exudate, no erythema and lips, buccal mucosa, and tongue normal  NECK: supple, thyroid normal size, non-tender, without nodularity LYMPH:  no palpable lymphadenopathy in the cervical, axillary or inguinal LUNGS: clear to auscultation and percussion with normal breathing effort HEART: regular rate & rhythm and no murmurs and no lower extremity edema ABDOMEN:abdomen soft, non-tender and normal bowel sounds Musculoskeletal:no cyanosis of digits and no clubbing  NEURO: alert & oriented x 3 with fluent speech, no focal motor/sensory deficits Normal bilateral breast exam  LABORATORY DATA:  I have reviewed the data as listed     Component Value Date/Time   NA 138 09/20/2019 1506   NA 138 12/12/2016 1150   K 4.4 09/20/2019 1506   K 4.6 12/12/2016 1150   CL 100 09/20/2019 1506   CL 103 07/27/2012 1259   CO2 29 09/20/2019 1506   CO2 31 (H)  12/12/2016 1150   GLUCOSE 98 09/20/2019 1506   GLUCOSE 149 (H) 12/12/2016 1150   GLUCOSE 118 (H) 07/27/2012 1259   BUN 32 (H) 09/20/2019 1506   BUN 23.4 12/12/2016 1150   CREATININE 2.18 (H) 09/20/2019 1506   CREATININE 1.7 (H) 12/12/2016 1150   CALCIUM 9.9 09/20/2019 1506   CALCIUM 11.4 (H) 12/12/2016 1150   PROT 6.0 (L) 09/20/2019 1506   PROT 6.0 (L) 09/20/2019 1506   PROT 6.5 12/12/2016 1150   ALBUMIN 4.0 04/29/2019 1451   ALBUMIN 3.9 12/12/2016 1150   AST 16 09/20/2019 1506   AST 19 12/12/2016 1150   ALT 6 09/20/2019 1506   ALT 10 12/12/2016 1150   ALKPHOS 68 04/29/2019 1451   ALKPHOS 65 12/12/2016 1150   BILITOT 0.5 09/20/2019 1506   BILITOT 0.36 12/12/2016 1150   GFRNONAA 21 (L) 09/20/2019 1506   GFRAA 25 (L) 09/20/2019 1506    No results found for: SPEP, UPEP  Lab Results  Component Value Date   WBC 6.0 05/12/2020   NEUTROABS 2.7 05/12/2020   HGB 10.3 (L) 05/12/2020   HCT 33.9 (L)  05/12/2020   MCV 88.5 05/12/2020   PLT 168 05/12/2020      Chemistry      Component Value Date/Time   NA 138 09/20/2019 1506   NA 138 12/12/2016 1150   K 4.4 09/20/2019 1506   K 4.6 12/12/2016 1150   CL 100 09/20/2019 1506   CL 103 07/27/2012 1259   CO2 29 09/20/2019 1506   CO2 31 (H) 12/12/2016 1150   BUN 32 (H) 09/20/2019 1506   BUN 23.4 12/12/2016 1150   CREATININE 2.18 (H) 09/20/2019 1506   CREATININE 1.7 (H) 12/12/2016 1150      Component Value Date/Time   CALCIUM 9.9 09/20/2019 1506   CALCIUM 11.4 (H) 12/12/2016 1150   ALKPHOS 68 04/29/2019 1451   ALKPHOS 65 12/12/2016 1150   AST 16 09/20/2019 1506   AST 19 12/12/2016 1150   ALT 6 09/20/2019 1506   ALT 10 12/12/2016 1150   BILITOT 0.5 09/20/2019 1506   BILITOT 0.36 12/12/2016 1150

## 2020-05-15 NOTE — Telephone Encounter (Signed)
Received a fax from  [employer] regarding an approval for [place] patient assistance from 05/12/20 to 03/24/21.  Will send approval letter to scan center.  Called patient to notify and provided phone number to schedule Forteo shipment.  Phone number: [phone]

## 2020-05-17 ENCOUNTER — Telehealth: Payer: Self-pay | Admitting: Rheumatology

## 2020-05-17 DIAGNOSIS — N2581 Secondary hyperparathyroidism of renal origin: Secondary | ICD-10-CM | POA: Diagnosis not present

## 2020-05-17 DIAGNOSIS — I129 Hypertensive chronic kidney disease with stage 1 through stage 4 chronic kidney disease, or unspecified chronic kidney disease: Secondary | ICD-10-CM | POA: Diagnosis not present

## 2020-05-17 DIAGNOSIS — D631 Anemia in chronic kidney disease: Secondary | ICD-10-CM | POA: Diagnosis not present

## 2020-05-17 DIAGNOSIS — N1832 Chronic kidney disease, stage 3b: Secondary | ICD-10-CM | POA: Diagnosis not present

## 2020-05-17 NOTE — Telephone Encounter (Signed)
Returned patient's call and discussed that pharmacy will be unable to refund medication cost. Discussed that she will no longer have to pay for the medication the remainder of the year and is in turn saving money. She verbalized understanding. She plans to order shipment of Forteo 06/09/20   Knox Saliva, PharmD, MPH Clinical Pharmacist (Rheumatology and Pulmonology)/

## 2020-05-17 NOTE — Telephone Encounter (Signed)
Patient request you call her. I am not sure what in reference to. Patient requests a call by 1:30 pm if possible. She has an appointment at 2 pm.

## 2020-05-19 ENCOUNTER — Other Ambulatory Visit: Payer: Self-pay | Admitting: Endocrinology

## 2020-05-22 DIAGNOSIS — M79641 Pain in right hand: Secondary | ICD-10-CM | POA: Diagnosis not present

## 2020-05-22 DIAGNOSIS — M79642 Pain in left hand: Secondary | ICD-10-CM | POA: Diagnosis not present

## 2020-05-29 ENCOUNTER — Other Ambulatory Visit: Payer: Self-pay | Admitting: Internal Medicine

## 2020-06-02 ENCOUNTER — Telehealth: Payer: Self-pay | Admitting: Internal Medicine

## 2020-06-02 DIAGNOSIS — N184 Chronic kidney disease, stage 4 (severe): Secondary | ICD-10-CM

## 2020-06-02 DIAGNOSIS — E039 Hypothyroidism, unspecified: Secondary | ICD-10-CM

## 2020-06-02 MED ORDER — SYNTHROID 75 MCG PO TABS: 75.0000 ug | ORAL_TABLET | Freq: Every day | ORAL | 0 refills | Status: DC

## 2020-06-02 NOTE — Telephone Encounter (Signed)
Patient called and requested that her Synthroid be re-sent to [facility] on [address]

## 2020-06-02 NOTE — Telephone Encounter (Signed)
Rx changed to correct [facility]

## 2020-06-05 ENCOUNTER — Encounter: Payer: Self-pay | Admitting: Podiatry

## 2020-06-05 ENCOUNTER — Other Ambulatory Visit: Payer: Self-pay

## 2020-06-05 ENCOUNTER — Ambulatory Visit: Payer: Medicare Other | Admitting: Podiatry

## 2020-06-05 ENCOUNTER — Other Ambulatory Visit: Payer: Self-pay | Admitting: Endocrinology

## 2020-06-05 DIAGNOSIS — M79675 Pain in left toe(s): Secondary | ICD-10-CM | POA: Diagnosis not present

## 2020-06-05 DIAGNOSIS — M79674 Pain in right toe(s): Secondary | ICD-10-CM

## 2020-06-05 DIAGNOSIS — B351 Tinea unguium: Secondary | ICD-10-CM

## 2020-06-05 DIAGNOSIS — E1142 Type 2 diabetes mellitus with diabetic polyneuropathy: Secondary | ICD-10-CM

## 2020-06-06 NOTE — Telephone Encounter (Signed)
Please refill for Dr. Darnell Level if indicated

## 2020-06-09 ENCOUNTER — Inpatient Hospital Stay: Payer: Medicare Other

## 2020-06-09 ENCOUNTER — Ambulatory Visit: Payer: Medicare Other

## 2020-06-09 ENCOUNTER — Inpatient Hospital Stay: Payer: Medicare Other | Attending: Hematology and Oncology

## 2020-06-09 ENCOUNTER — Other Ambulatory Visit: Payer: Self-pay

## 2020-06-09 ENCOUNTER — Other Ambulatory Visit: Payer: Medicare Other

## 2020-06-09 VITALS — BP 136/51 | HR 51 | Resp 16

## 2020-06-09 DIAGNOSIS — Z79899 Other long term (current) drug therapy: Secondary | ICD-10-CM | POA: Diagnosis not present

## 2020-06-09 DIAGNOSIS — D539 Nutritional anemia, unspecified: Secondary | ICD-10-CM

## 2020-06-09 DIAGNOSIS — N183 Chronic kidney disease, stage 3 unspecified: Secondary | ICD-10-CM | POA: Insufficient documentation

## 2020-06-09 DIAGNOSIS — E538 Deficiency of other specified B group vitamins: Secondary | ICD-10-CM | POA: Insufficient documentation

## 2020-06-09 DIAGNOSIS — D631 Anemia in chronic kidney disease: Secondary | ICD-10-CM | POA: Insufficient documentation

## 2020-06-09 LAB — CBC WITH DIFFERENTIAL/PLATELET
Abs Immature Granulocytes: 0.02 10*3/uL (ref 0.00–0.07)
Basophils Absolute: 0 10*3/uL (ref 0.0–0.1)
Basophils Relative: 0 %
Eosinophils Absolute: 0.4 10*3/uL (ref 0.0–0.5)
Eosinophils Relative: 5 %
HCT: 35.5 % — ABNORMAL LOW (ref 36.0–46.0)
Hemoglobin: 11 g/dL — ABNORMAL LOW (ref 12.0–15.0)
Immature Granulocytes: 0 %
Lymphocytes Relative: 32 %
Lymphs Abs: 2.2 10*3/uL (ref 0.7–4.0)
MCH: 27.1 pg (ref 26.0–34.0)
MCHC: 31 g/dL (ref 30.0–36.0)
MCV: 87.4 fL (ref 80.0–100.0)
Monocytes Absolute: 0.5 10*3/uL (ref 0.1–1.0)
Monocytes Relative: 7 %
Neutro Abs: 3.9 10*3/uL (ref 1.7–7.7)
Neutrophils Relative %: 56 %
Platelets: 187 10*3/uL (ref 150–400)
RBC: 4.06 MIL/uL (ref 3.87–5.11)
RDW: 14.6 % (ref 11.5–15.5)
WBC: 7 10*3/uL (ref 4.0–10.5)
nRBC: 0 % (ref 0.0–0.2)

## 2020-06-09 MED ORDER — CYANOCOBALAMIN 1000 MCG/ML IJ SOLN
INTRAMUSCULAR | Status: AC
  Filled 2020-06-09: qty 1

## 2020-06-09 MED ORDER — CYANOCOBALAMIN 1000 MCG/ML IJ SOLN
1000.0000 ug | Freq: Once | INTRAMUSCULAR | Status: AC
  Administered 2020-06-09: 1000 ug via INTRAMUSCULAR

## 2020-06-09 NOTE — Patient Instructions (Signed)

## 2020-06-11 LAB — ERYTHROPOIETIN: Erythropoietin: 8.7 m[IU]/mL (ref 2.6–18.5)

## 2020-06-11 NOTE — Progress Notes (Signed)
Subjective: Abigail Hoover presents today at risk foot care. Pt has h/o NIDDM with chronic kidney disease and painful mycotic nails b/l that are difficult to trim. Pain interferes with ambulation. Aggravating factors include wearing enclosed shoe gear. Pain is relieved with periodic professional debridement.  She notes no new pedal concerns on today's visit.  PCP is Dr. [name]. Also followed by Dr. Philemon Kingdom and last visit was 03/27/2020.  Patient states her blood sugar was 109 mg/dl today.   Allergies  Allergen Reactions  . Atorvastatin Rash  . Ezetimibe Itching    Objective: Abigail Hoover is a pleasant 75 y.o. female in NAD. AAO x 3.  There were no vitals filed for this visit.  Vascular Examination: Neurovascular status unchanged b/l lower extremities. Capillary refill time to digits immediate b/l. Palpable DP pulses b/l. Palpable PT pulses b/l. Pedal hair sparse b/l. Skin temperature gradient within normal limits b/l. No edema noted b/l.  Dermatological Examination: Pedal skin with normal turgor, texture and tone bilaterally. No open wounds bilaterally. No interdigital macerations bilaterally. Toenails 1-5 b/l elongated, discolored, dystrophic, thickened, crumbly with subungual debris and tenderness to dorsal palpation.  Musculoskeletal: Normal muscle strength 5/5 to all lower extremity muscle groups bilaterally. No pain crepitus or joint limitation noted with ROM b/l. No gross bony deformities bilaterally. Patient ambulates independent of any assistive aids.  Neurological Examination: Pt has subjective symptoms of neuropathy. Protective sensation intact 5/5 intact bilaterally with 10g monofilament b/l. Vibratory sensation intact b/l. Proprioception intact bilaterally.  Assessment: 1. Pain due to onychomycosis of toenails of both feet   2. Diabetic peripheral neuropathy associated with type 2 diabetes mellitus ([place])    Plan: -Examined patient. -No new findings.  No new orders. -Continue diabetic foot care principles. Literature dispensed on today.  -Toenails 1-5 b/l were debrided in length and girth with sterile nail nippers and dremel without iatrogenic bleeding.  -Patient to continue soft, supportive shoe gear daily. -Patient to report any pedal injuries to medical professional immediately. -Patient/POA to call should there be question/concern in the interim.  Return in about 4 months (around 10/05/2020).  Marzetta Board, DPM

## 2020-06-16 ENCOUNTER — Other Ambulatory Visit: Payer: Self-pay

## 2020-06-16 ENCOUNTER — Ambulatory Visit
Admission: RE | Admit: 2020-06-16 | Discharge: 2020-06-16 | Disposition: A | Discharge status: Home / Self Care | Payer: Medicare Other | Source: Ambulatory Visit | Attending: General Surgery | Admitting: General Surgery

## 2020-06-16 DIAGNOSIS — Z1231 Encounter for screening mammogram for malignant neoplasm of breast: Secondary | ICD-10-CM

## 2020-06-23 ENCOUNTER — Other Ambulatory Visit (HOSPITAL_COMMUNITY): Payer: Self-pay

## 2020-07-06 ENCOUNTER — Other Ambulatory Visit: Payer: Self-pay | Admitting: Endocrinology

## 2020-07-06 NOTE — Telephone Encounter (Signed)
Please refill if okay with Dr. Darnell Level

## 2020-07-10 ENCOUNTER — Inpatient Hospital Stay: Payer: Medicare Other | Attending: Hematology and Oncology

## 2020-07-10 ENCOUNTER — Other Ambulatory Visit: Payer: Self-pay

## 2020-07-10 ENCOUNTER — Inpatient Hospital Stay: Payer: Medicare Other

## 2020-07-10 ENCOUNTER — Other Ambulatory Visit: Payer: Medicare Other

## 2020-07-10 VITALS — BP 139/68 | HR 54 | Temp 98.0°F | Resp 17

## 2020-07-10 DIAGNOSIS — E1122 Type 2 diabetes mellitus with diabetic chronic kidney disease: Secondary | ICD-10-CM | POA: Diagnosis not present

## 2020-07-10 DIAGNOSIS — N184 Chronic kidney disease, stage 4 (severe): Secondary | ICD-10-CM | POA: Diagnosis not present

## 2020-07-10 DIAGNOSIS — N183 Chronic kidney disease, stage 3 unspecified: Secondary | ICD-10-CM

## 2020-07-10 DIAGNOSIS — Z79899 Other long term (current) drug therapy: Secondary | ICD-10-CM | POA: Diagnosis not present

## 2020-07-10 DIAGNOSIS — I129 Hypertensive chronic kidney disease with stage 1 through stage 4 chronic kidney disease, or unspecified chronic kidney disease: Secondary | ICD-10-CM | POA: Diagnosis not present

## 2020-07-10 DIAGNOSIS — E538 Deficiency of other specified B group vitamins: Secondary | ICD-10-CM | POA: Diagnosis not present

## 2020-07-10 DIAGNOSIS — N189 Chronic kidney disease, unspecified: Secondary | ICD-10-CM

## 2020-07-10 DIAGNOSIS — D631 Anemia in chronic kidney disease: Secondary | ICD-10-CM | POA: Insufficient documentation

## 2020-07-10 LAB — CBC WITH DIFFERENTIAL/PLATELET
Abs Immature Granulocytes: 0.03 10*3/uL (ref 0.00–0.07)
Basophils Absolute: 0 10*3/uL (ref 0.0–0.1)
Basophils Relative: 0 %
Eosinophils Absolute: 0.5 10*3/uL (ref 0.0–0.5)
Eosinophils Relative: 9 %
HCT: 31.8 % — ABNORMAL LOW (ref 36.0–46.0)
Hemoglobin: 10.1 g/dL — ABNORMAL LOW (ref 12.0–15.0)
Immature Granulocytes: 1 %
Lymphocytes Relative: 38 %
Lymphs Abs: 2.2 10*3/uL (ref 0.7–4.0)
MCH: 26.6 pg (ref 26.0–34.0)
MCHC: 31.8 g/dL (ref 30.0–36.0)
MCV: 83.9 fL (ref 80.0–100.0)
Monocytes Absolute: 0.4 10*3/uL (ref 0.1–1.0)
Monocytes Relative: 7 %
Neutro Abs: 2.6 10*3/uL (ref 1.7–7.7)
Neutrophils Relative %: 45 %
Platelets: 151 10*3/uL (ref 150–400)
RBC: 3.79 MIL/uL — ABNORMAL LOW (ref 3.87–5.11)
RDW: 14.6 % (ref 11.5–15.5)
WBC: 5.9 10*3/uL (ref 4.0–10.5)
nRBC: 0 % (ref 0.0–0.2)

## 2020-07-10 MED ORDER — CYANOCOBALAMIN 1000 MCG/ML IJ SOLN
1000.0000 ug | Freq: Once | INTRAMUSCULAR | Status: AC
  Administered 2020-07-10: 1000 ug via INTRAMUSCULAR

## 2020-07-10 MED ORDER — DARBEPOETIN ALFA 200 MCG/0.4ML IJ SOSY
200.0000 ug | PREFILLED_SYRINGE | Freq: Once | INTRAMUSCULAR | Status: AC
  Administered 2020-07-10: 200 ug via SUBCUTANEOUS

## 2020-07-10 MED ORDER — DARBEPOETIN ALFA 200 MCG/0.4ML IJ SOSY
PREFILLED_SYRINGE | INTRAMUSCULAR | Status: AC
  Filled 2020-07-10: qty 0.4

## 2020-07-17 ENCOUNTER — Other Ambulatory Visit (HOSPITAL_COMMUNITY): Payer: Self-pay

## 2020-07-19 ENCOUNTER — Other Ambulatory Visit (HOSPITAL_COMMUNITY): Payer: Self-pay

## 2020-07-20 ENCOUNTER — Other Ambulatory Visit (HOSPITAL_COMMUNITY): Payer: Self-pay

## 2020-07-20 ENCOUNTER — Telehealth: Payer: Self-pay

## 2020-07-20 DIAGNOSIS — M810 Age-related osteoporosis without current pathological fracture: Secondary | ICD-10-CM

## 2020-07-20 MED ORDER — "PEN NEEDLES 3/16"" 31G X 5 MM MISC": 2 refills | Status: AC

## 2020-07-20 NOTE — Telephone Encounter (Signed)
Spoke with patient. She requested pen needles be sent to [facility].  Knox Saliva, PharmD, MPH Clinical Pharmacist (Rheumatology and Pulmonology)

## 2020-07-20 NOTE — Telephone Encounter (Signed)
Patient called regarding her Forteo medication.  Patient states she needs a new prescription and would like the prescription to be mailed to her home since she is unable to drive to pick up the prescription.

## 2020-07-22 ENCOUNTER — Other Ambulatory Visit (HOSPITAL_COMMUNITY): Payer: Self-pay

## 2020-07-25 ENCOUNTER — Other Ambulatory Visit: Payer: Self-pay

## 2020-07-25 ENCOUNTER — Other Ambulatory Visit (HOSPITAL_COMMUNITY): Payer: Self-pay

## 2020-07-25 ENCOUNTER — Ambulatory Visit (INDEPENDENT_AMBULATORY_CARE_PROVIDER_SITE_OTHER): Payer: Medicare Other | Admitting: Internal Medicine

## 2020-07-25 ENCOUNTER — Encounter: Payer: Self-pay | Admitting: Internal Medicine

## 2020-07-25 VITALS — BP 110/68 | HR 61 | Ht 61.0 in | Wt 126.0 lb

## 2020-07-25 DIAGNOSIS — E039 Hypothyroidism, unspecified: Secondary | ICD-10-CM

## 2020-07-25 DIAGNOSIS — N184 Chronic kidney disease, stage 4 (severe): Secondary | ICD-10-CM | POA: Diagnosis not present

## 2020-07-25 DIAGNOSIS — Z794 Long term (current) use of insulin: Secondary | ICD-10-CM

## 2020-07-25 DIAGNOSIS — E1122 Type 2 diabetes mellitus with diabetic chronic kidney disease: Secondary | ICD-10-CM | POA: Diagnosis not present

## 2020-07-25 LAB — POCT GLYCOSYLATED HEMOGLOBIN (HGB A1C): Hemoglobin A1C: 6.2 % — AB (ref 4.0–5.6)

## 2020-07-25 NOTE — Patient Instructions (Addendum)
Please continue: - Januvia 25 mg in a.m.before breakfast  Keep: - Novolog 3x a day, before meals:  9-11 units before breakfast 9-11 units before dinner  Please continue Unithroid 75 mcg daily: take this every day, with water, at least 30 minutes before breakfast, separated by at least 4 hours from: - acid reflux medications - calcium - iron - multivitamins  Please return in 3-4 months with your sugar log.

## 2020-07-25 NOTE — Progress Notes (Signed)
Patient ID: Abigail Hoover, female   DOB: 1945/11/11, 75 y.o.   MRN: [id number]   This visit occurred during the SARS-CoV-2 public health emergency.  Safety protocols were in place, including screening questions prior to the visit, additional usage of staff PPE, and extensive cleaning of exam room while observing appropriate contact time as indicated for disinfecting solutions.    HPI: [name] is a pleasant 75 y.o.-year-old female, initially referred by her PCP, Dr. Delfina Redwood, returning for follow-up for DM2, dx in her 53s, insulin-dependent since 05/2019, uncontrolled, with complications (CKD stage IV, PN) and hypothyroidism.  She previously saw Dr. Dwyane Dee in this office, last 07/2019.  Last visit 4 months ago.  Interim history: Since last OV, she changed from [place] to [place] in 03/2020.  No increased urination, blurry vision, nausea.  She does have leg swelling, which persist, despite taking Lasix.  DM2:  Reviewed HbA1c levels: Lab Results  Component Value Date   HGBA1C 6.6 (A) 03/27/2020   HGBA1C 6.3 (A) 12/28/2019   HGBA1C 7.6 (H) 07/30/2019   HGBA1C 7.7 (H) 04/29/2019   HGBA1C 6.6 (H) 01/27/2019   HGBA1C 6.9 (H) 10/30/2018   HGBA1C 6.3 03/03/2018   HGBA1C 5.7 10/21/2017   HGBA1C 6.1 05/27/2017   HGBA1C 6.6 (H) 12/20/2016   Previously on: - Januvia 25 mg daily in a.m.  - Novolog 3x a day, before meals: 8-10 units before breakfast, 3 units before snack, 8-9 units before dinner Stopped Actos 2/2 edema. She was on Repaglinide (Prandin).  We changed to: - Ozempic 0.5 mg weekly in a.m. >> Januvia 25 mg before dinner - Novolog 3x a day, before meals:  10-13 units before meals (usually  units)  Pt checks her sugars 3 times a day per review of her meter downloads: - am: 108-127, 202 >> 129 >> 119, 120 - 2h after b'fast: 131 (after 202) >>  316 (forgot insulin) - before lunch: 108-193, 212 >> 99-142, 155 >> 62-115, 205 - 2h after lunch: n/c >> 133-188 >> 63-192 - before  dinner: 123-198, 265 >> 70-184 (snack) >> 69-212 - 2h after dinner: 55-196 >> 53, 97-173 >> 55, 66-183, 219 - bedtime: n/c - nighttime: n/c Lowest sugar was 49, 55 >> 53; she has hypoglycemia awareness at 60s-70s.  Highest sugar was 265 >> 188 >> 316.  Glucometer: One Probation officer Flex  Pt's meals are vegetarian: - Breakfast: indian bread, veggies, tea - Lunch: skips - tea - Dinner: indian bread, rice, soup, veggies - Snacks: no  -+ CKD- seeing Dr. Posey Pronto, last BUN/creatinine:  02/07/2020:  Glu 122, BUN/Cr 28/1.47. GFR 34-per records from Dr. Posey Pronto Lab Results  Component Value Date   BUN 32 (H) 09/20/2019   BUN 33 (H) 07/30/2019   CREATININE 2.18 (H) 09/20/2019   CREATININE 1.74 (H) 07/30/2019  Not on ACE inhibitor/ARB.  -+ HL: last set of lipids: Lab Results  Component Value Date   CHOL 155 07/30/2019   HDL 57.00 07/30/2019   LDLCALC 76 07/30/2019   TRIG 110.0 07/30/2019   CHOLHDL 3 07/30/2019  On pravastatin 20 mg daily.  She had a rash with Lipitor.  - last eye exam was in 02/2020. No DR reportedly.   - she has numbness and tingling in her feet.  On gabapentin 300 mg for bladder.  On ASA 81.  Pt has FH of DM in brother, sister, mother, MGM.  Hypothyroidism:  Previously on Synthroid d.a.w. but currently on Unithroid d.a.w. (she prefers not to take generics).  Pt is on Unithroid d.a.w. 75 mcg daily, taken: - in am - fasting - at least 30 min from b'fast - no calcium - no iron - no multivitamins - no PPIs, + Carafate at night. Prev. On Dexilant. - not on Biotin  Reviewed TSH levels: Lab Results  Component Value Date   TSH 0.57 12/28/2019   TSH 0.20 (L) 07/30/2019   TSH 2.56 04/29/2019   TSH 1.97 01/27/2019   TSH 1.58 12/16/2018   TSH 10.00 (H) 10/30/2018   TSH 3.36 03/03/2018   TSH 1.64 10/21/2017   TSH 0.68 12/20/2016   TSH 1.63 07/26/2016   TSH 2.97 04/18/2016   TSH 0.32 (L) 01/19/2016   TSH 0.89 07/13/2015   TSH 0.97 12/07/2014   TSH 1.090  12/07/2014   Pt denies: - feeling nodules in neck - hoarseness - dysphagia - choking - SOB with lying down  No FH of thyroid cancer. No h/o radiation tx to head or neck.  No seaweed or kelp. No recent contrast studies. No herbal supplements. No Biotin use. No recent steroids use.   She has osteoporosis, managed by Dr. Estanislado Pandy.  She is on Forteo.  Also on vitamin D.  Sees Dr. Collene Mares.  ROS: Constitutional: + weight gain/no weight loss, no fatigue, no subjective hyperthermia, no subjective hypothermia Eyes: no blurry vision, no xerophthalmia ENT: no sore throat, + see HPI Cardiovascular: no CP/no SOB/no palpitations/+ B leg swelling Respiratory: no cough/no SOB/no wheezing Gastrointestinal: no N/no V/no D/no C/+ acid reflux Musculoskeletal: no muscle aches/+ joint aches (back) Skin: no rashes, + hair loss Neurological: no tremors/no numbness/no tingling/no dizziness  I reviewed pt's medications, allergies, PMH, social hx, family hx, and changes were documented in the history of present illness. Otherwise, unchanged from my initial visit note.  Past Medical History:  Diagnosis Date  . 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 Anxiety   . Arthritis   . Bladder irritation   . Diabetes mellitus   . GERD (gastroesophageal reflux disease)   . Hypercholesterolemia   . Hypertension   . Hypothyroidism   . Iron deficiency anemia, unspecified   . Psoriasis   . Transfusion history 03/05/2016   for postoperative (ORIF) anemia superimposed on chronic anemia   Past Surgical History:  Procedure Laterality Date  . ABDOMINAL HYSTERECTOMY     TAH  . BREAST BIOPSY     Left breast biopsy benign lesion  . BREAST BIOPSY  03/11/2012   Procedure: BREAST BIOPSY;  Surgeon: Odis Hollingshead, MD;  Location: [place];  Service: General;  Laterality: Left;  remove left breast mass  . BREAST EXCISIONAL BIOPSY Left   . CARDIAC CATHETERIZATION N/A 01/02/2015   Procedure: Left Heart Cath and  Coronary Angiography;  Surgeon: Charolette Forward, MD;  Location: [facility];  Service: Cardiovascular;  Laterality: N/A;  . CATARACT EXTRACTION    . CERVICAL DISC SURGERY    . COLONOSCOPY  2013   neg. next 2023.   Marland Kitchen EYE SURGERY    . IR KYPHO EA ADDL LEVEL THORACIC OR LUMBAR  09/09/2019  . IR KYPHO LUMBAR INC FX REDUCE BONE BX UNI/BIL CANNULATION INC/IMAGING  09/09/2019  . IR RADIOLOGIST EVAL & MGMT  09/08/2019  . LEFT HIP SURGERY    . TOTAL HIP ARTHROPLASTY Left 03/02/2016   Procedure: TOTAL HIP ARTHROPLASTY ANTERIOR APPROACH;  Surgeon: Rod Can, MD;  Location: [place];  Service: Orthopedics;  Laterality: Left;   Social History   Socioeconomic History  .  Marital status: Married    Spouse name: Not on file  . Number of children: 1  . Years of education: college  . Highest education level: Not on file  Occupational History  . Occupation: Homemaker  Tobacco Use  . Smoking status: Never Smoker  . Smokeless tobacco: Never Used  Vaping Use  . Vaping Use: Never used  Substance and Sexual Activity  . Alcohol use: No    Alcohol/week: 0.0 standard drinks  . Drug use: No  . Sexual activity: Not Currently  Other Topics Concern  . Not on file  Social History Narrative   Patient is married with one child.   Patient is right handed.   Patient has college education.   Patient drinks tea, two times a day   Social Determinants of Health   Financial Resource Strain: Not on file  Food Insecurity: Not on file  Transportation Needs: Not on file  Physical Activity: Not on file  Stress: Not on file  Social Connections: Not on file  Intimate Partner Violence: Not on file   Current Outpatient Medications on File Prior to Visit  Medication Sig Dispense Refill  . aspirin EC 81 MG tablet Take 81 mg by mouth daily. Take 1 tablet by mouth once daily.    Marland Kitchen CARAFATE 1 GM/10ML suspension TAKE 10ML BY MOUTH 4 (FOUR) TIMES A DAY BETWEEN MEALS AND AT BEDTIME    . colchicine 0.6 MG tablet TAKE  ONE-HALF TABLETS (0.3 MG TOTAL) BY MOUTH DAILY. 15 tablet 2  . FORTEO 620 MCG/2.48ML SOPN INJECT 20 MCG INTO THE SKIN DAILY. DISCARD AFTER 28 DAYS. 2.4 mL 5  . furosemide (LASIX) 40 MG tablet Take 40 mg by mouth. Take 1 tablet by mouth three times per week.    . gabapentin (NEURONTIN) 300 MG capsule Take by mouth.    Marland Kitchen glucose blood (ONETOUCH VERIO) test strip Use Onetouch verio test strips to check blood sugar three times daily. (90 day RX) 300 each 3  . Insulin Pen Needle (PEN NEEDLES 3/16") 31G X 5 MM MISC Use 1 pen needle to inject Forteo daily. 100 each 2  . Insulin Pen Needle 31G X 5 MM MISC Use with Novolog 100 each 1  . Insulin Syringe-Needle U-100 25G X 5/8" 1 ML MISC USE DAILY TO INJECT INSULIN 100 each 3  . Lancet Devices (ONE TOUCH DELICA LANCING DEV) MISC 1 each by Does not apply route daily. Use as instructed to check blood sugar once daily. 1 each 0  . Lancets (ONETOUCH DELICA PLUS [id number]) MISC TEST 3 TIMES DAILY 100 each 2  . metoprolol succinate (TOPROL-XL) 50 MG 24 hr tablet Take 1 tablet (50 mg total) by mouth daily. Take with or immediately following a meal. 30 tablet 3  . mirabegron ER (MYRBETRIQ) 50 MG TB24 tablet Take by mouth.    Marland Kitchen NOVOLOG FLEXPEN 100 UNIT/ML FlexPen INJECT 12 UNITS UNDER THE SKIN BEFORE BREAKFAST, 4- 5 UNITS BEFORE LUNCH AND 12 UNITS BEFORE DINNER 15 mL 1  . pantoprazole (PROTONIX) 40 MG tablet Take 40 mg by mouth daily.    . pentosan polysulfate (ELMIRON) 100 MG capsule TAKE 1 CAPSULE (100 MG TOTAL) BY MOUTH 3 TIMES DAILY BEFORE MEALS.    Marland Kitchen phenazopyridine (PYRIDIUM) 100 MG tablet     . pravastatin (PRAVACHOL) 20 MG tablet TAKE ONE TABLET BY MOUTH ONCE DAILY 90 tablet 1  . sitaGLIPtin (JANUVIA) 25 MG tablet Take 1 tablet (25 mg total) by mouth daily. 90 tablet 3  .  SYNTHROID 75 MCG tablet Take 1 tablet (75 mcg total) by mouth daily. 90 tablet 0   No current facility-administered medications on file prior to visit.   Allergies  Allergen Reactions   . Atorvastatin Rash  . Ezetimibe Itching   Family History  Problem Relation Age of Onset  . Heart disease Mother        heart attack  . Stroke Father        brain hemorrhage  . Diabetes Brother   . Breast cancer Sister   . Cancer Sister        breast cancer    PE: BP 110/68 (BP Location: Right Arm, Patient Position: Sitting, Cuff Size: Normal)   Pulse 61   Ht 5\' 1"  (1.549 m)   Wt 126 lb (57.2 kg)   SpO2 98%   BMI 23.81 kg/m  Wt Readings from Last 3 Encounters:  07/25/20 126 lb (57.2 kg)  05/12/20 126 lb 9.6 oz (57.4 kg)  05/10/20 125 lb (56.7 kg)   Constitutional: normal weight, in NAD Eyes: PERRLA, EOMI, no exophthalmos ENT: moist mucous membranes, no thyromegaly, no cervical lymphadenopathy Cardiovascular: RRR, No MRG, + B LE edema Respiratory: CTA B Gastrointestinal: abdomen soft, NT, ND, BS+ Musculoskeletal: no deformities, strength intact in all 4 Skin: moist, warm, no rashes Neurological: no tremor with outstretched hands, DTR normal in all 4  ASSESSMENT: 1. DM2, insulin-dependent, uncontrolled, with long-term complications - CKD now stage 4 - PN  No personal history of pancreatitis or family history of medullary thyroid cancer.  2.  Hypothyroidism  PLAN:  1. DM2 Patient with longstanding, uncontrolled, type 2 diabetes, on injectable antidiabetic regimen with rapid acting insulin before meals and previously weekly GLP-1 receptor agonist, change since last visit per her request to [place].  She felt that Ozempic was actually elevating her blood sugars... Latest HbA1c was slightly higher 4 months ago, at 6.6%. -At this visit, per review of her meter downloads, her sugars are fairly well controlled, with occasional hyperglycemic excursions (she had 1 blood sugar in the 300s in the last 2 weeks when she forgot to take her insulin before a meal) but also some low blood sugars, in the 50s and 60s.  Since these low blood sugars may occur after meals, I advised her  to decrease the dose of NovoLog slightly.  Advised her to let me know if she has any more lows, but for now, we can continue the rest of the regimen. -The timing on her meter is off by 15 hours, and we changed this at today's visit - I suggested to:  Patient Instructions  Please continue: - Januvia 25 mg in a.m.before breakfast  Keep: - Novolog 3x a day, before meals:  9-11 units before breakfast 9-11 units before dinner  Please continue Unithroid 75 mcg daily: take this every day, with water, at least 30 minutes before breakfast, separated by at least 4 hours from: - acid reflux medications - calcium - iron - multivitamins  Please return in 3-4 months with your sugar log.   - we checked her HbA1c: 6.2% (great!) - advised to check sugars at different times of the day - 3-4x a day, rotating check times - advised for yearly eye exams >> she is UTD - She is due for lipid panel.  She would prefer to have this with her nephrologist next month. - return to clinic in 3-4 months  2. Hypothyroidism - latest thyroid labs reviewed with pt >> normal: Lab Results  Component Value Date   TSH 0.57 12/28/2019   - she continues on LT4 75 mcg daily - pt feels good on this dose. - we discussed about taking the thyroid hormone every day, with water, >30 minutes before breakfast, separated by >4 hours from acid reflux medications, calcium, iron, multivitamins. Pt. is taking it correctly. - will check thyroid tests at next visit  Philemon Kingdom, MD PhD [facility]

## 2020-07-26 ENCOUNTER — Telehealth: Payer: Self-pay | Admitting: Hematology and Oncology

## 2020-07-26 NOTE — Telephone Encounter (Signed)
R/s per provider, Patient aware

## 2020-08-01 ENCOUNTER — Other Ambulatory Visit (HOSPITAL_COMMUNITY): Payer: Self-pay

## 2020-08-09 ENCOUNTER — Telehealth: Payer: Self-pay | Admitting: Hematology and Oncology

## 2020-08-09 ENCOUNTER — Ambulatory Visit: Payer: Medicare Other | Admitting: Hematology and Oncology

## 2020-08-09 ENCOUNTER — Other Ambulatory Visit: Payer: Medicare Other

## 2020-08-09 ENCOUNTER — Ambulatory Visit: Payer: Medicare Other

## 2020-08-09 ENCOUNTER — Other Ambulatory Visit (HOSPITAL_COMMUNITY): Payer: Self-pay

## 2020-08-09 NOTE — Telephone Encounter (Signed)
R/s appts per 5/18 sch msg. Pt aware.

## 2020-08-10 ENCOUNTER — Inpatient Hospital Stay: Payer: Medicare Other

## 2020-08-10 ENCOUNTER — Inpatient Hospital Stay: Payer: Medicare Other | Admitting: Hematology and Oncology

## 2020-08-10 ENCOUNTER — Other Ambulatory Visit (HOSPITAL_COMMUNITY): Payer: Self-pay

## 2020-08-11 ENCOUNTER — Ambulatory Visit: Payer: Medicare Other | Admitting: Hematology and Oncology

## 2020-08-11 ENCOUNTER — Ambulatory Visit: Payer: Medicare Other

## 2020-08-11 ENCOUNTER — Other Ambulatory Visit: Payer: Medicare Other

## 2020-08-14 ENCOUNTER — Other Ambulatory Visit (HOSPITAL_COMMUNITY): Payer: Self-pay

## 2020-08-15 ENCOUNTER — Other Ambulatory Visit (HOSPITAL_COMMUNITY): Payer: Self-pay

## 2020-08-16 NOTE — Progress Notes (Signed)
Office Visit Note  Patient: Abigail Hoover             Date of Birth: 28-Oct-1945           MRN: [id number]             PCP: [name], MD Referring: [name], MD Visit Date: 08/30/2020 Occupation: @GUAROCC @  Subjective:  Medication monitor.   History of Present Illness: Abigail Hoover is a 75 y.o. female with a history of osteoarthritis and osteoporosis.  She has been on Forteo injections since September 29, 2019.  She states she has been taking daily injections.  She has been also taking vitamin D.  She continues to have some stiffness in her hands and her knee joints.  She denies any joint swelling.  She has constant discomfort in her lower back but is tolerable.  Activities of Daily Living:  Patient reports morning stiffness for 0 minutes.   Patient Reports nocturnal pain.  Difficulty dressing/grooming: Denies Difficulty climbing stairs: Denies Difficulty getting out of chair: Denies Difficulty using hands for taps, buttons, cutlery, and/or writing: Denies  Review of Systems  Constitutional: Negative for fatigue.  HENT: Negative for mouth sores, mouth dryness and nose dryness.   Eyes: Negative for pain, itching and dryness.  Respiratory: Negative for shortness of breath and difficulty breathing.   Cardiovascular: Positive for swelling in legs/feet. Negative for chest pain and palpitations.  Gastrointestinal: Negative for blood in stool, constipation and diarrhea.  Endocrine: Negative for increased urination.  Genitourinary: Negative for difficulty urinating.  Musculoskeletal: Negative for arthralgias, joint pain, joint swelling, myalgias, morning stiffness, muscle tenderness and myalgias.  Skin: Negative for color change, rash and redness.  Allergic/Immunologic: Negative for susceptible to infections.  Neurological: Negative for dizziness, numbness, headaches, memory loss and weakness.  Hematological: Positive for bruising/bleeding tendency.  Psychiatric/Behavioral:  Negative for confusion.    PMFS History:  Patient Active Problem List   Diagnosis Date Noted  . DDD (degenerative disc disease), lumbar 08/26/2019  . Age-related osteoporosis without current pathological fracture 08/26/2019  . Primary osteoarthritis of both knees 08/26/2019  . Primary osteoarthritis of both hands 08/26/2019  . Diabetes mellitus with chronic kidney disease, with long-term current use of insulin ([place]) 05/11/2019  . Pain due to onychomycosis of toenails of both feet 09/30/2018  . Chronic pain of right thumb 08/27/2018  . Plantar fasciitis of left foot 05/19/2018  . Lower limb pain, inferior, left 04/28/2018  . Dry skin 03/12/2018  . Other fatigue 09/18/2017  . Pain and swelling of right wrist 08/06/2017  . At high risk for breast cancer 03/06/2017  . Hypercalcemia 12/16/2016  . Vitamin B 12 deficiency 12/16/2016  . Displaced fracture of left femoral neck ([place]) 03/02/2016  . Hyperkalemia 03/01/2016  . Femoral neck fracture ([place]) 02/29/2016  . Anemia due to stage 3 (moderate) chronic renal failure treated with erythropoietin ([place]) 12/18/2015  . Chronic kidney disease, stage III (moderate) ([place]) 08/14/2015  . 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 Acute coronary syndrome ([place]) 01/02/2015  . Deficiency anemia 05/27/2014  . Carpal tunnel syndrome 12/09/2013  . Diabetic neuropathy ([place]) 12/09/2013  . Type 2 diabetes mellitus with stage 4 chronic kidney disease, with long-term current use of insulin ([place]) 04/21/2013  . Disturbance of skin sensation 04/21/2013  . Hypothyroidism 10/30/2012  . Breast mass in female-left 03/10/2012  . Weight loss 11/28/2011  . Family history of breast cancer 08/01/2010  . Essential hypertension 02/01/2009  . EDEMA LEGS 02/01/2009  Past Medical History:  Diagnosis Date  . 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 Anxiety   . Arthritis   . Bladder irritation   . Diabetes mellitus   . GERD (gastroesophageal reflux disease)    . Hypercholesterolemia   . Hypertension   . Hypothyroidism   . Iron deficiency anemia, unspecified   . Psoriasis   . Transfusion history 03/05/2016   for postoperative (ORIF) anemia superimposed on chronic anemia    Family History  Problem Relation Age of Onset  . Heart disease Mother        heart attack  . Stroke Father        brain hemorrhage  . Diabetes Brother   . Breast cancer Sister   . Cancer Sister        breast cancer   Past Surgical History:  Procedure Laterality Date  . ABDOMINAL HYSTERECTOMY     TAH  . BREAST BIOPSY     Left breast biopsy benign lesion  . BREAST BIOPSY  03/11/2012   Procedure: BREAST BIOPSY;  Surgeon: Odis Hollingshead, MD;  Location: [place];  Service: General;  Laterality: Left;  remove left breast mass  . BREAST EXCISIONAL BIOPSY Left   . CARDIAC CATHETERIZATION N/A 01/02/2015   Procedure: Left Heart Cath and Coronary Angiography;  Surgeon: Charolette Forward, MD;  Location: [place] [facility];  Service: Cardiovascular;  Laterality: N/A;  . CATARACT EXTRACTION    . CERVICAL DISC SURGERY    . COLONOSCOPY  2013   neg. next 2023.   Marland Kitchen EYE SURGERY    . IR KYPHO EA ADDL LEVEL THORACIC OR LUMBAR  09/09/2019  . IR KYPHO LUMBAR INC FX REDUCE BONE BX UNI/BIL CANNULATION INC/IMAGING  09/09/2019  . IR RADIOLOGIST EVAL & MGMT  09/08/2019  . LEFT HIP SURGERY    . TOTAL HIP ARTHROPLASTY Left 03/02/2016   Procedure: TOTAL HIP ARTHROPLASTY ANTERIOR APPROACH;  Surgeon: Rod Can, MD;  Location: [place];  Service: Orthopedics;  Laterality: Left;   Social History   Social History Narrative   Patient is married with one child.   Patient is right handed.   Patient has college education.   Patient drinks tea, two times a day   Immunization History  Administered Date(s) Administered  . Fluad Quad(high Dose 65+) 12/22/2018  . Influenza Split 11/13/2010  . Influenza, High Dose Seasonal PF 01/25/2016, 12/24/2016  . Influenza,inj,Quad PF,6+ Mos 12/29/2012,  12/20/2013, 01/16/2015  . Pneumococcal Conjugate-13 01/16/2015  . Pneumococcal Polysaccharide-23 03/24/2013     Objective: Vital Signs: BP (!) 100/59 (BP Location: Left Arm, Patient Position: Sitting, Cuff Size: Normal)   Pulse (!) 55   Resp 14   Ht 4\' 8"  (1.422 m)   Wt 125 lb (56.7 kg)   BMI 28.02 kg/m    Physical Exam Vitals and nursing note reviewed.  Constitutional:      Appearance: She is well-developed.  HENT:     Head: Normocephalic and atraumatic.  Eyes:     Conjunctiva/sclera: Conjunctivae normal.  Cardiovascular:     Rate and Rhythm: Normal rate and regular rhythm.     Heart sounds: Normal heart sounds.  Pulmonary:     Effort: Pulmonary effort is normal.     Breath sounds: Normal breath sounds.  Abdominal:     General: Bowel sounds are normal.     Palpations: Abdomen is soft.  Musculoskeletal:     Cervical back: Normal range of motion.  Lymphadenopathy:     Cervical:  No cervical adenopathy.  Skin:    General: Skin is warm and dry.     Capillary Refill: Capillary refill takes less than 2 seconds.  Neurological:     Mental Status: She is alert and oriented to person, place, and time.  Psychiatric:        Behavior: Behavior normal.      Musculoskeletal Exam: She had limited range of motion of her cervical spine.  She had thoracic kyphosis.  She had definitive painful range of motion of her lumbar spine.  There was no point tenderness.  Abduction of shoulder joints with limited to 100 degree bilaterally.  Elbow joints and wrist joints were in good range of motion.  She had some PIP and DIP thickening with no synovitis.  Hip joints and knee joints in good range of motion.  There was no tenderness over ankles or MTPs. CDAI Exam: CDAI Score: -- Patient Global: --; Provider Global: -- Swollen: --; Tender: -- Joint Exam 08/30/2020   No joint exam has been documented for this visit   There is currently no information documented on the homunculus. Go to the  Rheumatology activity and complete the homunculus joint exam.  Investigation: No additional findings.  Imaging: No results found.  Recent Labs: Lab Results  Component Value Date   WBC 5.9 07/10/2020   HGB 10.1 (L) 07/10/2020   PLT 151 07/10/2020   NA 138 09/20/2019   K 4.4 09/20/2019   CL 100 09/20/2019   CO2 29 09/20/2019   GLUCOSE 98 09/20/2019   BUN 32 (H) 09/20/2019   CREATININE 2.18 (H) 09/20/2019   BILITOT 0.5 09/20/2019   ALKPHOS 68 04/29/2019   AST 16 09/20/2019   ALT 6 09/20/2019   PROT 6.0 (L) 09/20/2019   PROT 6.0 (L) 09/20/2019   ALBUMIN 4.0 04/29/2019   CALCIUM 9.9 09/20/2019   GFRAA 25 (L) 09/20/2019    Speciality Comments: No specialty comments available.  Procedures:  No procedures performed Allergies: Atorvastatin and Ezetimibe   Assessment / Plan:     Visit Diagnoses: Age-related osteoporosis with current pathological fracture, sequela - DXADate of Scan: 09/22/2019 Lowest T-score and site measured: -2.2 Right Femoral Neck. started on Forteo on September 29, 2019.  She is on vitamin D.  Calcium rich diet was discussed.  Need for regular exercise was emphasized.  The plan is to start her on Prolia injections after Forteo.  Vertebral fracture, osteoporotic, sequela - status post kyphoplasty.   Other secondary kyphosis, thoracolumbar region-  DDD (degenerative disc disease), lumbar-she continues to have some lower back discomfort.  She has underlying disc disease and facet joint arthropathy.  Maintaining good posture was discussed.  Have given her a handout on back exercises.  Facet arthropathy, lumbar  Primary osteoarthritis of both hands-joint protection was discussed.  Primary osteoarthritis of both knees - bilateral moderate osteoarthritis and mild chondromalacia patella.  She continues to have some discomfort in her knee joints.  Chondrocalcinosis-she denies any recent pseudogout flares.  Hyperuricemia-she has not had gout flare.  Displaced  fracture of left femoral neck (HCC)  Psoriasis-no active lesions were noted.  Essential hypertension-blood pressure was low today.  History of diabetes mellitus  Hypothyroidism, unspecified type - followed by Dr. Dwyane Dee.  Anemia due to stage 3 (moderate) chronic renal failure treated with erythropoietin ([place]) - followed by Dr. Graylon Gunning.  Patient will get repeat labs with Dr. Posey Pronto.  I advised her to get CBC with differential, comprehensive with GFR, uric acid and vitamin D  levels.  Other diabetic neurological complication associated with type 2 diabetes mellitus ([place])  Orders: No orders of the defined types were placed in this encounter.  No orders of the defined types were placed in this encounter.    Follow-Up Instructions: Return in about 6 months (around 03/01/2021) for Osteoarthritis.   Bo Merino, MD  Note - This record has been created using Editor, commissioning.  Chart creation errors have been sought, but may not always  have been located. Such creation errors do not reflect on  the standard of medical care.

## 2020-08-17 ENCOUNTER — Other Ambulatory Visit (HOSPITAL_COMMUNITY): Payer: Self-pay

## 2020-08-17 ENCOUNTER — Other Ambulatory Visit: Payer: Self-pay | Admitting: Physician Assistant

## 2020-08-17 NOTE — Telephone Encounter (Signed)
Next Visit:08/30/2020  Last Visit: 03/02/2020  Last Fill: 05/05/2020  DX: Hyperuricemia   Current Dose per office note: 03/02/2020,  colchicine 0.3mg  by mouth daily.   Labs: 07/10/2020, RBC 3.79, hemoglobin 10.1, HCT 31.8, CMP 09/20/2019, GFR is low. If patient does not have a nephrologist she should get nephrology evaluation. Patient be able to start her on Forteo once PTH results are available and it is approved by the insurance.  08/26/2019, Iron saturation was low, uric acid elevated. 7.9  Okay to refill colchicine?

## 2020-08-18 ENCOUNTER — Other Ambulatory Visit (HOSPITAL_COMMUNITY): Payer: Self-pay

## 2020-08-23 ENCOUNTER — Telehealth: Payer: Self-pay

## 2020-08-23 NOTE — Telephone Encounter (Signed)
PA for Novolog Flexpen has been approved Effective from 08/23/2020 through 08/23/2021.

## 2020-08-24 ENCOUNTER — Other Ambulatory Visit (HOSPITAL_COMMUNITY): Payer: Self-pay

## 2020-08-28 ENCOUNTER — Other Ambulatory Visit: Payer: Self-pay | Admitting: Internal Medicine

## 2020-08-28 DIAGNOSIS — E039 Hypothyroidism, unspecified: Secondary | ICD-10-CM

## 2020-08-30 ENCOUNTER — Other Ambulatory Visit: Payer: Self-pay

## 2020-08-30 ENCOUNTER — Ambulatory Visit: Payer: Medicare Other | Admitting: Rheumatology

## 2020-08-30 ENCOUNTER — Encounter: Payer: Self-pay | Admitting: Rheumatology

## 2020-08-30 VITALS — BP 100/59 | HR 55 | Resp 14 | Ht <= 58 in | Wt 125.0 lb

## 2020-08-30 DIAGNOSIS — M19041 Primary osteoarthritis, right hand: Secondary | ICD-10-CM

## 2020-08-30 DIAGNOSIS — M47816 Spondylosis without myelopathy or radiculopathy, lumbar region: Secondary | ICD-10-CM | POA: Diagnosis not present

## 2020-08-30 DIAGNOSIS — M4015 Other secondary kyphosis, thoracolumbar region: Secondary | ICD-10-CM

## 2020-08-30 DIAGNOSIS — M170 Bilateral primary osteoarthritis of knee: Secondary | ICD-10-CM

## 2020-08-30 DIAGNOSIS — M19042 Primary osteoarthritis, left hand: Secondary | ICD-10-CM

## 2020-08-30 DIAGNOSIS — I10 Essential (primary) hypertension: Secondary | ICD-10-CM

## 2020-08-30 DIAGNOSIS — M5136 Other intervertebral disc degeneration, lumbar region: Secondary | ICD-10-CM

## 2020-08-30 DIAGNOSIS — M1120 Other chondrocalcinosis, unspecified site: Secondary | ICD-10-CM

## 2020-08-30 DIAGNOSIS — E039 Hypothyroidism, unspecified: Secondary | ICD-10-CM

## 2020-08-30 DIAGNOSIS — D631 Anemia in chronic kidney disease: Secondary | ICD-10-CM

## 2020-08-30 DIAGNOSIS — M8000XS Age-related osteoporosis with current pathological fracture, unspecified site, sequela: Secondary | ICD-10-CM

## 2020-08-30 DIAGNOSIS — Z8639 Personal history of other endocrine, nutritional and metabolic disease: Secondary | ICD-10-CM

## 2020-08-30 DIAGNOSIS — N183 Chronic kidney disease, stage 3 unspecified: Secondary | ICD-10-CM

## 2020-08-30 DIAGNOSIS — E1149 Type 2 diabetes mellitus with other diabetic neurological complication: Secondary | ICD-10-CM

## 2020-08-30 DIAGNOSIS — L409 Psoriasis, unspecified: Secondary | ICD-10-CM

## 2020-08-30 DIAGNOSIS — S72002A Fracture of unspecified part of neck of left femur, initial encounter for closed fracture: Secondary | ICD-10-CM

## 2020-08-30 DIAGNOSIS — M8008XS Age-related osteoporosis with current pathological fracture, vertebra(e), sequela: Secondary | ICD-10-CM

## 2020-08-30 DIAGNOSIS — E790 Hyperuricemia without signs of inflammatory arthritis and tophaceous disease: Secondary | ICD-10-CM

## 2020-08-30 NOTE — Patient Instructions (Addendum)
Please get CBC with differential , CMP with GFR, uric acid and vitamin D at your follow-up appointment with Dr. Posey Pronto.  Please forward results to [facility] health rheumatology attention Dr. Estanislado Pandy at [phone].     Back Exercises The following exercises strengthen the muscles that help to support the trunk and back. They also help to keep the lower back flexible. Doing these exercises can help to prevent back pain or lessen existing pain.  If you have back pain or discomfort, try doing these exercises 2-3 times each day or as told by your health care provider.  As your pain improves, do them once each day, but increase the number of times that you repeat the steps for each exercise (do more repetitions).  To prevent the recurrence of back pain, continue to do these exercises once each day or as told by your health care provider. Do exercises exactly as told by your health care provider and adjust them as directed. It is normal to feel mild stretching, pulling, tightness, or discomfort as you do these exercises, but you should stop right away if you feel sudden pain or your pain gets worse. Exercises Single knee to chest Repeat these steps 3-5 times for each leg: 1. Lie on your back on a firm bed or the floor with your legs extended. 2. Bring one knee to your chest. Your other leg should stay extended and in contact with the floor. 3. Hold your knee in place by grabbing your knee or thigh with both hands and hold. 4. Pull on your knee until you feel a gentle stretch in your lower back or buttocks. 5. Hold the stretch for 10-30 seconds. 6. Slowly release and straighten your leg. Pelvic tilt Repeat these steps 5-10 times: 1. Lie on your back on a firm bed or the floor with your legs extended. 2. Bend your knees so they are pointing toward the ceiling and your feet are flat on the floor. 3. Tighten your lower abdominal muscles to press your lower back against the floor. This motion will tilt  your pelvis so your tailbone points up toward the ceiling instead of pointing to your feet or the floor. 4. With gentle tension and even breathing, hold this position for 5-10 seconds. Cat-cow Repeat these steps until your lower back becomes more flexible: 1. Get into a hands-and-knees position on a firm surface. Keep your hands under your shoulders, and keep your knees under your hips. You may place padding under your knees for comfort. 2. Let your head hang down toward your chest. Contract your abdominal muscles and point your tailbone toward the floor so your lower back becomes rounded like the back of a cat. 3. Hold this position for 5 seconds. 4. Slowly lift your head, let your abdominal muscles relax and point your tailbone up toward the ceiling so your back forms a sagging arch like the back of a cow. 5. Hold this position for 5 seconds.   Press-ups Repeat these steps 5-10 times: 1. Lie on your abdomen (face-down) on the floor. 2. Place your palms near your head, about shoulder-width apart. 3. Keeping your back as relaxed as possible and keeping your hips on the floor, slowly straighten your arms to raise the top half of your body and lift your shoulders. Do not use your back muscles to raise your upper torso. You may adjust the placement of your hands to make yourself more comfortable. 4. Hold this position for 5 seconds while you keep your  back relaxed. 5. Slowly return to lying flat on the floor.   Bridges Repeat these steps 10 times: 1. Lie on your back on a firm surface. 2. Bend your knees so they are pointing toward the ceiling and your feet are flat on the floor. Your arms should be flat at your sides, next to your body. 3. Tighten your buttocks muscles and lift your buttocks off the floor until your waist is at almost the same height as your knees. You should feel the muscles working in your buttocks and the back of your thighs. If you do not feel these muscles, slide your feet 1-2  inches farther away from your buttocks. 4. Hold this position for 3-5 seconds. 5. Slowly lower your hips to the starting position, and allow your buttocks muscles to relax completely. If this exercise is too easy, try doing it with your arms crossed over your chest.   Abdominal crunches Repeat these steps 5-10 times: 1. Lie on your back on a firm bed or the floor with your legs extended. 2. Bend your knees so they are pointing toward the ceiling and your feet are flat on the floor. 3. Cross your arms over your chest. 4. Tip your chin slightly toward your chest without bending your neck. 5. Tighten your abdominal muscles and slowly raise your trunk (torso) high enough to lift your shoulder blades a tiny bit off the floor. Avoid raising your torso higher than that because it can put too much stress on your low back and does not help to strengthen your abdominal muscles. 6. Slowly return to your starting position. Back lifts Repeat these steps 5-10 times: 1. Lie on your abdomen (face-down) with your arms at your sides, and rest your forehead on the floor. 2. Tighten the muscles in your legs and your buttocks. 3. Slowly lift your chest off the floor while you keep your hips pressed to the floor. Keep the back of your head in line with the curve in your back. Your eyes should be looking at the floor. 4. Hold this position for 3-5 seconds. 5. Slowly return to your starting position. Contact a health care provider if:  Your back pain or discomfort gets much worse when you do an exercise.  Your worsening back pain or discomfort does not lessen within 2 hours after you exercise. If you have any of these problems, stop doing these exercises right away. Do not do them again unless your health care provider says that you can. Get help right away if:  You develop sudden, severe back pain. If this happens, stop doing the exercises right away. Do not do them again unless your health care provider says  that you can. This information is not intended to replace advice given to you by your health care provider. Make sure you discuss any questions you have with your health care provider. Document Revised: 07/16/2018 Document Reviewed: 12/11/2017 Elsevier Patient Education  [place].

## 2020-09-01 ENCOUNTER — Inpatient Hospital Stay: Payer: Medicare Other

## 2020-09-01 ENCOUNTER — Inpatient Hospital Stay: Payer: Medicare Other | Attending: Hematology and Oncology

## 2020-09-01 ENCOUNTER — Inpatient Hospital Stay: Payer: Medicare Other | Admitting: Hematology and Oncology

## 2020-09-01 ENCOUNTER — Encounter: Payer: Self-pay | Admitting: Hematology and Oncology

## 2020-09-01 ENCOUNTER — Other Ambulatory Visit: Payer: Self-pay

## 2020-09-01 DIAGNOSIS — E538 Deficiency of other specified B group vitamins: Secondary | ICD-10-CM | POA: Insufficient documentation

## 2020-09-01 DIAGNOSIS — D631 Anemia in chronic kidney disease: Secondary | ICD-10-CM

## 2020-09-01 DIAGNOSIS — Z803 Family history of malignant neoplasm of breast: Secondary | ICD-10-CM

## 2020-09-01 DIAGNOSIS — E611 Iron deficiency: Secondary | ICD-10-CM | POA: Diagnosis not present

## 2020-09-01 DIAGNOSIS — Z79899 Other long term (current) drug therapy: Secondary | ICD-10-CM | POA: Diagnosis not present

## 2020-09-01 DIAGNOSIS — N183 Chronic kidney disease, stage 3 unspecified: Secondary | ICD-10-CM | POA: Diagnosis not present

## 2020-09-01 LAB — CBC WITH DIFFERENTIAL/PLATELET
Abs Immature Granulocytes: 0.02 10*3/uL (ref 0.00–0.07)
Basophils Absolute: 0 10*3/uL (ref 0.0–0.1)
Basophils Relative: 0 %
Eosinophils Absolute: 0.4 10*3/uL (ref 0.0–0.5)
Eosinophils Relative: 9 %
HCT: 34.7 % — ABNORMAL LOW (ref 36.0–46.0)
Hemoglobin: 11.1 g/dL — ABNORMAL LOW (ref 12.0–15.0)
Immature Granulocytes: 0 %
Lymphocytes Relative: 40 %
Lymphs Abs: 1.9 10*3/uL (ref 0.7–4.0)
MCH: 26.9 pg (ref 26.0–34.0)
MCHC: 32 g/dL (ref 30.0–36.0)
MCV: 84 fL (ref 80.0–100.0)
Monocytes Absolute: 0.3 10*3/uL (ref 0.1–1.0)
Monocytes Relative: 7 %
Neutro Abs: 2.1 10*3/uL (ref 1.7–7.7)
Neutrophils Relative %: 44 %
Platelets: 146 10*3/uL — ABNORMAL LOW (ref 150–400)
RBC: 4.13 MIL/uL (ref 3.87–5.11)
RDW: 15.2 % (ref 11.5–15.5)
WBC: 4.8 10*3/uL (ref 4.0–10.5)
nRBC: 0 % (ref 0.0–0.2)

## 2020-09-01 NOTE — Assessment & Plan Note (Signed)
She has history of vitamin B-12 deficiency Her recent B12 level is good We can probably space out her B12 injection every 3 months

## 2020-09-01 NOTE — Assessment & Plan Note (Signed)
She has multifactorial anemia She has combined recurrent iron deficiency anemia, B12 deficiency as well as anemia chronic kidney disease She is asymptomatic She does not need injection today I will reschedule to next month

## 2020-09-01 NOTE — Assessment & Plan Note (Signed)
I noted well-healed lumpectomy scar on and breast exam Exam is benign She is reassured

## 2020-09-01 NOTE — Progress Notes (Signed)
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   The total time spent in the appointment was 20 minutes encounter with patients including review of chart and various tests results, discussions about plan of care and coordination of care plan   All questions were answered. The patient knows to call the clinic with any problems, questions or concerns. No barriers to learning was detected.    [name], MD 6/10/202212:38 PM  INTERVAL HISTORY: Abigail Hoover 75 y.o. female returns for follow-up for anemia secondary to chronic kidney disease, B12 deficiency and benign breast lesion She is doing well She have no symptoms of anemia She denies any recent abnormal breast examination, palpable mass, abnormal breast appearance or nipple changes  SUMMARY OF HEMATOLOGIC HISTORY:  This is a patient that has been followed here since 2011 for chronic anemia In 2011, she had a bone marrow aspirate and biopsy that came back normal. The patient has been receiving Aranesp injection for chronic anemia up until 2014. It was subsequently discontinued due stability of the anemia. Starting 05/27/2014, darbepoetin was resumed at 200 g every other month to keep hemoglobin greater than 10 g. Treatment was discontinued in October 2016 and resumed in September 2017 The  patient is subsequently found to have severe vitamin B12 deficiency and is started on vitamin B12 injection on December 25, 2016 along with resumption of darbepoetin injection every 5 to 6 weeks.  She is also seen due to family history of breast cancer She also required intravenous iron infusion intermittently for concurrent iron deficiency anemia   I have reviewed the past medical history, past surgical history, social history and family history with the patient and they are unchanged from previous note.  ALLERGIES:  is allergic to atorvastatin and ezetimibe.  MEDICATIONS:  Current Outpatient Medications  Medication Sig Dispense Refill   aspirin EC 81 MG tablet Take 81 mg by mouth daily. Take 1 tablet by mouth once daily.     CARAFATE 1 GM/10ML suspension TAKE 10ML BY MOUTH 4 (FOUR) TIMES A DAY BETWEEN MEALS AND AT BEDTIME     colchicine 0.6 MG tablet TAKE ONE-HALF TABLETS (0.3 MG TOTAL) BY MOUTH DAILY. 15 tablet 2   FORTEO 620 MCG/2.48ML SOPN INJECT 20 MCG INTO THE SKIN DAILY. DISCARD AFTER 28 DAYS. 2.4 mL 5   furosemide (LASIX) 40 MG tablet Take 40 mg by mouth. Take 1 tablet by mouth three times per week.     gabapentin (NEURONTIN) 300 MG capsule Take 300 mg by mouth 3 (three) times daily.     glucose blood (ONETOUCH VERIO) test strip Use Onetouch verio test strips to check blood sugar three times daily. (90 day RX) 300 each 3   Insulin Pen Needle (PEN NEEDLES 3/16") 31G X 5 MM MISC Use 1 pen needle to inject Forteo daily. 100 each 2   Insulin  Pen Needle 31G X 5 MM MISC Use with Novolog 100 each 1   Insulin Syringe-Needle U-100 25G X 5/8" 1 ML MISC USE DAILY TO INJECT INSULIN 100 each 3   Lancet Devices (ONE TOUCH DELICA LANCING DEV) MISC 1 each by Does not apply route daily. Use as instructed to check blood sugar once daily. 1 each 0   Lancets (ONETOUCH DELICA PLUS [id number]) MISC TEST 3 TIMES DAILY 100 each 2   metoprolol succinate (TOPROL-XL) 50 MG 24 hr tablet Take 1 tablet (50 mg total)  by mouth daily. Take with or immediately following a meal. 30 tablet 3   mirabegron ER (MYRBETRIQ) 50 MG TB24 tablet Take by mouth.     NOVOLOG FLEXPEN 100 UNIT/ML FlexPen INJECT 12 UNITS UNDER THE SKIN BEFORE BREAKFAST, 4- 5 UNITS BEFORE LUNCH AND 12 UNITS BEFORE DINNER 15 mL 1   pantoprazole (PROTONIX) 40 MG tablet Take 40 mg by mouth daily.     pentosan polysulfate (ELMIRON) 100 MG capsule TAKE 1 CAPSULE (100 MG TOTAL) BY MOUTH 3 TIMES DAILY BEFORE MEALS.     pravastatin (PRAVACHOL) 20 MG tablet TAKE ONE TABLET BY MOUTH ONCE DAILY 90 tablet 1   sitaGLIPtin (JANUVIA) 25 MG tablet Take 1 tablet (25 mg total) by mouth daily. 90 tablet 3   SYNTHROID 75 MCG tablet TAKE 1 TABLET(75 MCG) BY MOUTH DAILY 90 tablet 2   No current facility-administered medications for this visit.     REVIEW OF SYSTEMS:   Constitutional: Denies fevers, chills or night sweats Eyes: Denies blurriness of vision Ears, nose, mouth, throat, and face: Denies mucositis or sore throat Respiratory: Denies cough, dyspnea or wheezes Cardiovascular: Denies palpitation, chest discomfort or lower extremity swelling Gastrointestinal:  Denies nausea, heartburn or change in bowel habits Skin: Denies abnormal skin rashes Lymphatics: Denies new lymphadenopathy or easy bruising Neurological:Denies numbness, tingling or new weaknesses Behavioral/Psych: Mood is stable, no new changes  All other systems were reviewed with the patient and are negative.  PHYSICAL EXAMINATION: ECOG PERFORMANCE STATUS: 1 - Symptomatic but completely ambulatory  Vitals:   09/01/20 1205  BP: 128/61  Pulse: (!) 57  Resp: 18  Temp: 97.8 F (36.6 C)  SpO2: 100%   Filed Weights   09/01/20 1205  Weight: 123 lb 12.8 oz (56.2 kg)    GENERAL:alert, no distress and comfortable SKIN: skin color, texture, turgor are normal, no rashes or significant lesions EYES: normal, Conjunctiva are pink and non-injected, sclera clear OROPHARYNX:no exudate, no  erythema and lips, buccal mucosa, and tongue normal  NECK: supple, thyroid normal size, non-tender, without nodularity LYMPH:  no palpable lymphadenopathy in the cervical, axillary or inguinal LUNGS: clear to auscultation and percussion with normal breathing effort HEART: regular rate & rhythm and no murmurs and no lower extremity edema ABDOMEN:abdomen soft, non-tender and normal bowel sounds Musculoskeletal:no cyanosis of digits and no clubbing  NEURO: alert & oriented x 3 with fluent speech, no focal motor/sensory deficits Noted well-healed lumpectomy scar on the left breast but no other abnormalities  LABORATORY DATA:  I have reviewed the data as listed     Component Value Date/Time   NA 138 09/20/2019 1506   NA 138 12/12/2016 1150   K 4.4 09/20/2019 1506   K 4.6 12/12/2016 1150   CL 100 09/20/2019 1506   CL 103 07/27/2012 1259   CO2 29 09/20/2019 1506   CO2 31 (H) 12/12/2016 1150   GLUCOSE 98 09/20/2019 1506   GLUCOSE 149 (H) 12/12/2016 1150  GLUCOSE 118 (H) 07/27/2012 1259   BUN 32 (H) 09/20/2019 1506   BUN 23.4 12/12/2016 1150   CREATININE 2.18 (H) 09/20/2019 1506   CREATININE 1.7 (H) 12/12/2016 1150   CALCIUM 9.9 09/20/2019 1506   CALCIUM 11.4 (H) 12/12/2016 1150   PROT 6.0 (L) 09/20/2019 1506   PROT 6.0 (L) 09/20/2019 1506   PROT 6.5 12/12/2016 1150   ALBUMIN 4.0 04/29/2019 1451   ALBUMIN 3.9 12/12/2016 1150   AST 16 09/20/2019 1506   AST 19 12/12/2016 1150   ALT 6 09/20/2019 1506   ALT 10 12/12/2016 1150   ALKPHOS 68 04/29/2019 1451   ALKPHOS 65 12/12/2016 1150   BILITOT 0.5 09/20/2019 1506   BILITOT 0.36 12/12/2016 1150   GFRNONAA 21 (L) 09/20/2019 1506   GFRAA 25 (L) 09/20/2019 1506    No results found for: SPEP, UPEP  Lab Results  Component Value Date   WBC 4.8 09/01/2020   NEUTROABS 2.1 09/01/2020   HGB 11.1 (L) 09/01/2020   HCT 34.7 (L) 09/01/2020   MCV 84.0 09/01/2020   PLT 146 (L) 09/01/2020      Chemistry      Component Value Date/Time    NA 138 09/20/2019 1506   NA 138 12/12/2016 1150   K 4.4 09/20/2019 1506   K 4.6 12/12/2016 1150   CL 100 09/20/2019 1506   CL 103 07/27/2012 1259   CO2 29 09/20/2019 1506   CO2 31 (H) 12/12/2016 1150   BUN 32 (H) 09/20/2019 1506   BUN 23.4 12/12/2016 1150   CREATININE 2.18 (H) 09/20/2019 1506   CREATININE 1.7 (H) 12/12/2016 1150      Component Value Date/Time   CALCIUM 9.9 09/20/2019 1506   CALCIUM 11.4 (H) 12/12/2016 1150   ALKPHOS 68 04/29/2019 1451   ALKPHOS 65 12/12/2016 1150   AST 16 09/20/2019 1506   AST 19 12/12/2016 1150   ALT 6 09/20/2019 1506   ALT 10 12/12/2016 1150   BILITOT 0.5 09/20/2019 1506   BILITOT 0.36 12/12/2016 1150       RADIOGRAPHIC STUDIES: I have personally reviewed the radiological images as listed and agreed with the findings in the report. No results found.

## 2020-09-05 ENCOUNTER — Other Ambulatory Visit: Payer: Self-pay | Admitting: *Deleted

## 2020-09-05 DIAGNOSIS — M1120 Other chondrocalcinosis, unspecified site: Secondary | ICD-10-CM

## 2020-09-05 DIAGNOSIS — M8008XS Age-related osteoporosis with current pathological fracture, vertebra(e), sequela: Secondary | ICD-10-CM

## 2020-09-05 DIAGNOSIS — L409 Psoriasis, unspecified: Secondary | ICD-10-CM

## 2020-09-05 DIAGNOSIS — M8000XS Age-related osteoporosis with current pathological fracture, unspecified site, sequela: Secondary | ICD-10-CM

## 2020-09-05 DIAGNOSIS — N1832 Chronic kidney disease, stage 3b: Secondary | ICD-10-CM | POA: Diagnosis not present

## 2020-09-11 ENCOUNTER — Telehealth: Payer: Self-pay | Admitting: *Deleted

## 2020-09-11 NOTE — Telephone Encounter (Signed)
Labs received from:Dr. Posey Pronto  Drawn on:09/05/2020  Reviewed by: Hazel Sams, PA-C  Labs drawn:PTH, Vitamin D, Magnesium, CBC, CMP, Phosphorus, UA   Results:Hgb 10.6   Hct 33.7    MCH 26.4   Glucose 108   BUN 32   Creat. 1.64   GFR 32   A/G Ratio 2.3  Patient on Forteo 20 mcg SQ daily and Colchicine 0.3 mg daily.

## 2020-09-13 DIAGNOSIS — N2581 Secondary hyperparathyroidism of renal origin: Secondary | ICD-10-CM | POA: Diagnosis not present

## 2020-09-13 DIAGNOSIS — I129 Hypertensive chronic kidney disease with stage 1 through stage 4 chronic kidney disease, or unspecified chronic kidney disease: Secondary | ICD-10-CM | POA: Diagnosis not present

## 2020-09-13 DIAGNOSIS — D631 Anemia in chronic kidney disease: Secondary | ICD-10-CM | POA: Diagnosis not present

## 2020-09-13 DIAGNOSIS — N1832 Chronic kidney disease, stage 3b: Secondary | ICD-10-CM | POA: Diagnosis not present

## 2020-09-18 ENCOUNTER — Telehealth: Payer: Self-pay | Admitting: Rheumatology

## 2020-09-18 NOTE — Telephone Encounter (Signed)
I received a phone call from the patient.  She stated that she tripped on a wire at her home yesterday and landed on her knee.  She is having some discomfort in her knee.  She could not tell if her knee was swollen.  She is able to walk with some discomfort.  I advised her to ice her knee.  If her symptoms get worse she should notify us for evaluation. Bo Merino, MD

## 2020-10-09 ENCOUNTER — Ambulatory Visit: Payer: Medicare Other | Admitting: Podiatry

## 2020-10-11 ENCOUNTER — Other Ambulatory Visit: Payer: Self-pay

## 2020-10-11 ENCOUNTER — Encounter: Payer: Self-pay | Admitting: Podiatry

## 2020-10-11 ENCOUNTER — Ambulatory Visit: Payer: Medicare Other | Admitting: Podiatry

## 2020-10-11 DIAGNOSIS — M79675 Pain in left toe(s): Secondary | ICD-10-CM

## 2020-10-11 DIAGNOSIS — M79674 Pain in right toe(s): Secondary | ICD-10-CM

## 2020-10-11 DIAGNOSIS — E1142 Type 2 diabetes mellitus with diabetic polyneuropathy: Secondary | ICD-10-CM

## 2020-10-11 DIAGNOSIS — B351 Tinea unguium: Secondary | ICD-10-CM

## 2020-10-11 DIAGNOSIS — N183 Chronic kidney disease, stage 3 unspecified: Secondary | ICD-10-CM

## 2020-10-11 NOTE — Progress Notes (Signed)
This patient returns to my office for at risk foot care.  This patient requires this care by a professional since this patient will be at risk due to having diabetes and CKD. This patient is unable to cut nails herself since the patient cannot reach her nails.These nails are painful walking and wearing shoes.  This patient presents for at risk foot care today.  General Appearance  Alert, conversant and in no acute stress.  Vascular  Dorsalis pedis and posterior tibial  pulses are palpable  bilaterally.  Capillary return is within normal limits  bilaterally. Temperature is within normal limits  bilaterally.  Neurologic  Senn-Weinstein monofilament wire test within normal limits  bilaterally. Muscle power within normal limits bilaterally.  Nails Thick disfigured discolored nails with subungual debris  from hallux to fifth toes bilaterally. No evidence of bacterial infection or drainage bilaterally.  Orthopedic  No limitations of motion  feet .  No crepitus or effusions noted.  No bony pathology or digital deformities noted.  Skin  normotropic skin with no porokeratosis noted bilaterally.  No signs of infections or ulcers noted.     Onychomycosis  Pain in right toes  Pain in left toes  Consent was obtained for treatment procedures.   Mechanical debridement of nails 1-5  bilaterally performed with a nail nipper.  Filed with dremel without incident.    Return office visit    4 months                  Told patient to return for periodic foot care and evaluation due to potential at risk complications.   Wandell Scullion DPM   

## 2020-10-16 ENCOUNTER — Ambulatory Visit: Payer: Medicare Other

## 2020-10-16 ENCOUNTER — Other Ambulatory Visit: Payer: Medicare Other

## 2020-10-18 ENCOUNTER — Inpatient Hospital Stay: Payer: Medicare Other

## 2020-10-25 DIAGNOSIS — M13831 Other specified arthritis, right wrist: Secondary | ICD-10-CM | POA: Diagnosis not present

## 2020-10-25 DIAGNOSIS — M13841 Other specified arthritis, right hand: Secondary | ICD-10-CM | POA: Diagnosis not present

## 2020-10-25 DIAGNOSIS — M79642 Pain in left hand: Secondary | ICD-10-CM | POA: Diagnosis not present

## 2020-10-25 DIAGNOSIS — M79641 Pain in right hand: Secondary | ICD-10-CM | POA: Diagnosis not present

## 2020-10-26 DIAGNOSIS — M19042 Primary osteoarthritis, left hand: Secondary | ICD-10-CM | POA: Insufficient documentation

## 2020-10-26 DIAGNOSIS — Z86010 Personal history of colonic polyps: Secondary | ICD-10-CM | POA: Diagnosis not present

## 2020-10-26 DIAGNOSIS — Z1211 Encounter for screening for malignant neoplasm of colon: Secondary | ICD-10-CM | POA: Diagnosis not present

## 2020-10-26 DIAGNOSIS — R110 Nausea: Secondary | ICD-10-CM | POA: Diagnosis not present

## 2020-10-26 DIAGNOSIS — R6881 Early satiety: Secondary | ICD-10-CM | POA: Diagnosis not present

## 2020-10-26 DIAGNOSIS — K219 Gastro-esophageal reflux disease without esophagitis: Secondary | ICD-10-CM | POA: Diagnosis not present

## 2020-10-30 DIAGNOSIS — N1832 Chronic kidney disease, stage 3b: Secondary | ICD-10-CM | POA: Diagnosis not present

## 2020-11-06 ENCOUNTER — Telehealth: Payer: Self-pay | Admitting: Internal Medicine

## 2020-11-06 NOTE — Telephone Encounter (Signed)
A year supply was sent to pharmacy. glucose blood (ONETOUCH VERIO) test strip 300 each 3 03/27/2020    Sig: Use Onetouch verio test strips to check blood sugar three times daily. (90 day RX)   Sent to pharmacy as: glucose blood (ONETOUCH VERIO) test strip   E-Prescribing Status: Receipt confirmed by pharmacy (03/27/2020  2:33 PM EST)

## 2020-11-06 NOTE — Telephone Encounter (Signed)
Patient picking up a new meter at the office - ok per [place] Reader - RX for Test Strips for the New One Touch Verio Reflect Meter need to be sent to [facility] at [address]

## 2020-11-07 ENCOUNTER — Telehealth: Payer: Self-pay

## 2020-11-07 NOTE — Telephone Encounter (Signed)
Patient states she was given information about a belt as she has trouble on her back side. Patient has lost the list and is calling to ask about this list again. Please advise.

## 2020-11-07 NOTE — Telephone Encounter (Signed)
Okay to give prescription for back brace.  She may use back brace only for couple of hours a day while she is working.

## 2020-11-07 NOTE — Telephone Encounter (Signed)
Patient called requesting a return call to let her know where she can purchase the supportive "bed" that Dr. Estanislado Pandy recommended.

## 2020-11-08 NOTE — Telephone Encounter (Signed)
Attempted to contact the patient and left message for patient to call the office.  

## 2020-11-09 ENCOUNTER — Ambulatory Visit: Payer: Medicare Other

## 2020-11-09 ENCOUNTER — Other Ambulatory Visit: Payer: Medicare Other

## 2020-11-09 NOTE — Telephone Encounter (Signed)
Patient advised we can give prescription for back brace.  She may use back brace only for couple of hours a day while she is working. Patient expressed understanding and requested we mail th prescription to her. Prescription mailed to patient as requested.

## 2020-11-13 ENCOUNTER — Inpatient Hospital Stay: Payer: Medicare Other

## 2020-11-24 ENCOUNTER — Ambulatory Visit: Payer: Medicare Other | Admitting: Internal Medicine

## 2020-11-24 NOTE — Progress Notes (Deleted)
Patient ID: Abigail Hoover, female   DOB: Aug 11, 1945, 75 y.o.   MRN: [id number]   This visit occurred during the SARS-CoV-2 public health emergency.  Safety protocols were in place, including screening questions prior to the visit, additional usage of staff PPE, and extensive cleaning of exam room while observing appropriate contact time as indicated for disinfecting solutions.    HPI: Abigail Hoover is a pleasant 75 y.o.-year-old female, initially referred by her PCP, Dr. Delfina Redwood, returning for follow-up for DM2, dx in her 69s, insulin-dependent since 05/2019, uncontrolled, with complications (CKD stage IV, PN) and hypothyroidism.  She previously saw Dr. Dwyane Dee in this office, last 07/2019.  Last visit 4 months ago.  Interim history: No increased urination, blurry vision, nausea, chest pain.  DM2:  Reviewed HbA1c levels: Lab Results  Component Value Date   HGBA1C 6.2 (A) 07/25/2020   HGBA1C 6.6 (A) 03/27/2020   HGBA1C 6.3 (A) 12/28/2019   HGBA1C 7.6 (H) 07/30/2019   HGBA1C 7.7 (H) 04/29/2019   HGBA1C 6.6 (H) 01/27/2019   HGBA1C 6.9 (H) 10/30/2018   HGBA1C 6.3 03/03/2018   HGBA1C 5.7 10/21/2017   HGBA1C 6.1 05/27/2017   Previously on: - Januvia 25 mg daily in a.m.  - Novolog 3x a day, before meals: 8-10 units before breakfast, 3 units before snack, 8-9 units before dinner Stopped Actos 2/2 edema. She was on Repaglinide (Prandin).  Now on: - Ozempic 0.5 mg weekly in a.m. >> Januvia 25 mg before dinner (changed 03/2020. - Novolog 3x a day, before meals:  10-13 units before meals >> 9-11 units before meals  Pt checks her sugars 3 times a day per review of her meter downloads: - am: 108-127, 202 >> 129 >> 119, 120 - 2h after b'fast: 131 (after 202) >>  316 (forgot insulin) - before lunch: 108-193, 212 >> 99-142, 155 >> 62-115, 205 - 2h after lunch: n/c >> 133-188 >> 63-192 - before dinner: 123-198, 265 >> 70-184 (snack) >> 69-212 - 2h after dinner: 55-196 >> 53, 97-173 >> 55,  66-183, 219 - bedtime: n/c - nighttime: n/c Lowest sugar was 49, 55 >> 53; she has hypoglycemia awareness at 60s-70s.  Highest sugar was 265 >> 188 >> 316.  Glucometer: One Probation officer Flex  Pt's meals are vegetarian: - Breakfast: indian bread, veggies, tea - Lunch: skips - tea - Dinner: indian bread, rice, soup, veggies - Snacks: no  -+ CKD- seeing Dr. Posey Pronto, last BUN/creatinine:  10/27/2020: Glu 181, BUN/Cr 40/2.77, GFR 17-per review of records from Dr. Collene Mares 09/05/2020: Glucose 108, BUN/Cr 32/1.64, GFR 32 -Per records from Dr. Posey Pronto 02/07/2020:  Glu 122, BUN/Cr 28/1.47. GFR 34-per records from Dr. Posey Pronto Lab Results  Component Value Date   BUN 32 (H) 09/20/2019   BUN 33 (H) 07/30/2019   CREATININE 2.18 (H) 09/20/2019   CREATININE 1.74 (H) 07/30/2019  Not on ACE inhibitor/ARB.  -+ HL: last set of lipids: Lab Results  Component Value Date   CHOL 155 07/30/2019   HDL 57.00 07/30/2019   LDLCALC 76 07/30/2019   TRIG 110.0 07/30/2019   CHOLHDL 3 07/30/2019  On pravastatin 20 mg daily.  She had a rash with Lipitor.  - last eye exam was in 02/2020. No DR reportedly.   - she has numbness and tingling in her feet.  On gabapentin 300 mg for bladder.  On ASA 81.  Pt has FH of DM in brother, sister, mother, MGM.  Hypothyroidism:  Previously on Synthroid d.a.w. but currently on Unithroid  d.a.w. (she prefers not to take generics).  Pt is on Unithroid d.a.w. 75 mcg daily, taken: - in am - fasting - at least 30 min from b'fast - no calcium - no iron - no multivitamins - no PPIs, + Carafate at night. Prev. On Dexilant. - not on Biotin  Reviewed TSH levels: Lab Results  Component Value Date   TSH 0.57 12/28/2019   TSH 0.20 (L) 07/30/2019   TSH 2.56 04/29/2019   TSH 1.97 01/27/2019   TSH 1.58 12/16/2018   TSH 10.00 (H) 10/30/2018   TSH 3.36 03/03/2018   TSH 1.64 10/21/2017   TSH 0.68 12/20/2016   TSH 1.63 07/26/2016   TSH 2.97 04/18/2016   TSH 0.32 (L) 01/19/2016    TSH 0.89 07/13/2015   TSH 0.97 12/07/2014   TSH 1.090 12/07/2014   Pt denies: - feeling nodules in neck - hoarseness - dysphagia - choking - SOB with lying down  No FH of thyroid cancer. No h/o radiation tx to head or neck.  No herbal supplements. No Biotin use. No recent steroids use.   She has osteoporosis, managed by Dr. Estanislado Pandy.  She is on Forteo.  Also on vitamin D.  Sees Dr. Collene Mares.  ROS: Constitutional: + weight gain/no weight loss, no fatigue, no subjective hyperthermia, no subjective hypothermia Eyes: no blurry vision, no xerophthalmia ENT: no sore throat, + see HPI Cardiovascular: no CP/no SOB/no palpitations/+ B leg swelling Respiratory: no cough/no SOB/no wheezing Gastrointestinal: no N/no V/no D/no C/+ acid reflux Musculoskeletal: no muscle aches/+ joint aches (back) Skin: no rashes, + hair loss Neurological: no tremors/no numbness/no tingling/no dizziness  I reviewed pt's medications, allergies, PMH, social hx, family hx, and changes were documented in the history of present illness. Otherwise, unchanged from my initial visit note.  Past Medical History:  Diagnosis Date   Anemia in chronic kidney disease 05/01/2015   Anxiety    Arthritis    Bladder irritation    Diabetes mellitus    GERD (gastroesophageal reflux disease)    Hypercholesterolemia    Hypertension    Hypothyroidism    Iron deficiency anemia, unspecified    Psoriasis    Transfusion history 03/05/2016   for postoperative (ORIF) anemia superimposed on chronic anemia   Past Surgical History:  Procedure Laterality Date   ABDOMINAL HYSTERECTOMY     TAH   BREAST BIOPSY     Left breast biopsy benign lesion   BREAST BIOPSY  03/11/2012   Procedure: BREAST BIOPSY;  Surgeon: Odis Hollingshead, MD;  Location: [place];  Service: General;  Laterality: Left;  remove left breast mass   BREAST EXCISIONAL BIOPSY Left    CARDIAC CATHETERIZATION N/A 01/02/2015   Procedure: Left Heart Cath and Coronary  Angiography;  Surgeon: Charolette Forward, MD;  Location: [place] [facility];  Service: Cardiovascular;  Laterality: N/A;   CATARACT EXTRACTION     CERVICAL DISC SURGERY     COLONOSCOPY  2013   neg. next 2023.    EYE SURGERY     IR KYPHO EA ADDL LEVEL THORACIC OR LUMBAR  09/09/2019   IR KYPHO LUMBAR INC FX REDUCE BONE BX UNI/BIL CANNULATION INC/IMAGING  09/09/2019   IR RADIOLOGIST EVAL & MGMT  09/08/2019   LEFT HIP SURGERY     TOTAL HIP ARTHROPLASTY Left 03/02/2016   Procedure: TOTAL HIP ARTHROPLASTY ANTERIOR APPROACH;  Surgeon: Rod Can, MD;  Location: [place] [facility];  Service: Orthopedics;  Laterality: Left;   Social History   Socioeconomic History  Marital status: Married    Spouse name: Not on file   Number of children: 1   Years of education: college   Highest education level: Not on file  Occupational History   Occupation: Homemaker  Tobacco Use   Smoking status: Never   Smokeless tobacco: Never  Vaping Use   Vaping Use: Never used  Substance and Sexual Activity   Alcohol use: No    Alcohol/week: 0.0 standard drinks   Drug use: No   Sexual activity: Not Currently  Other Topics Concern   Not on file  Social History Narrative   Patient is married with one child.   Patient is right handed.   Patient has college education.   Patient drinks tea, two times a day   Social Determinants of Health   Financial Resource Strain: Not on file  Food Insecurity: Not on file  Transportation Needs: Not on file  Physical Activity: Not on file  Stress: Not on file  Social Connections: Not on file  Intimate Partner Violence: Not on file   Current Outpatient Medications on File Prior to Visit  Medication Sig Dispense Refill   aspirin EC 81 MG tablet Take 81 mg by mouth daily. Take 1 tablet by mouth once daily.     CARAFATE 1 GM/10ML suspension TAKE 10ML BY MOUTH 4 (FOUR) TIMES A DAY BETWEEN MEALS AND AT BEDTIME     colchicine 0.6 MG tablet TAKE ONE-HALF TABLETS (0.3 MG TOTAL) BY MOUTH  DAILY. 15 tablet 2   FORTEO 620 MCG/2.48ML SOPN INJECT 20 MCG INTO THE SKIN DAILY. DISCARD AFTER 28 DAYS. 2.4 mL 5   furosemide (LASIX) 40 MG tablet Take 40 mg by mouth. Take 1 tablet by mouth three times per week.     gabapentin (NEURONTIN) 300 MG capsule Take 300 mg by mouth 3 (three) times daily.     glucose blood (ONETOUCH VERIO) test strip Use Onetouch verio test strips to check blood sugar three times daily. (90 day RX) 300 each 3   Insulin Pen Needle (PEN NEEDLES 3/16") 31G X 5 MM MISC Use 1 pen needle to inject Forteo daily. 100 each 2   Insulin Pen Needle 31G X 5 MM MISC Use with Novolog 100 each 1   Insulin Syringe-Needle U-100 25G X 5/8" 1 ML MISC USE DAILY TO INJECT INSULIN 100 each 3   Lancet Devices (ONE TOUCH DELICA LANCING DEV) MISC 1 each by Does not apply route daily. Use as instructed to check blood sugar once daily. 1 each 0   Lancets (ONETOUCH DELICA PLUS [id number]) MISC TEST 3 TIMES DAILY 100 each 2   metoprolol succinate (TOPROL-XL) 50 MG 24 hr tablet Take 1 tablet (50 mg total) by mouth daily. Take with or immediately following a meal. 30 tablet 3   mirabegron ER (MYRBETRIQ) 50 MG TB24 tablet Take by mouth.     NOVOLOG FLEXPEN 100 UNIT/ML FlexPen INJECT 12 UNITS UNDER THE SKIN BEFORE BREAKFAST, 4- 5 UNITS BEFORE LUNCH AND 12 UNITS BEFORE DINNER 15 mL 1   pantoprazole (PROTONIX) 40 MG tablet Take 40 mg by mouth daily.     pentosan polysulfate (ELMIRON) 100 MG capsule TAKE 1 CAPSULE (100 MG TOTAL) BY MOUTH 3 TIMES DAILY BEFORE MEALS.     pravastatin (PRAVACHOL) 20 MG tablet TAKE ONE TABLET BY MOUTH ONCE DAILY 90 tablet 1   sitaGLIPtin (JANUVIA) 25 MG tablet Take 1 tablet (25 mg total) by mouth daily. 90 tablet 3   SYNTHROID 75 MCG tablet  TAKE 1 TABLET(75 MCG) BY MOUTH DAILY 90 tablet 2   No current facility-administered medications on file prior to visit.   Allergies  Allergen Reactions   Atorvastatin Rash   Ezetimibe Itching   Family History  Problem Relation Age of  Onset   Heart disease Mother        heart attack   Stroke Father        brain hemorrhage   Diabetes Brother    Breast cancer Sister    Cancer Sister        breast cancer    PE: There were no vitals taken for this visit. Wt Readings from Last 3 Encounters:  09/01/20 123 lb 12.8 oz (56.2 kg)  08/30/20 125 lb (56.7 kg)  07/25/20 126 lb (57.2 kg)   Constitutional: normal weight, in NAD Eyes: PERRLA, EOMI, no exophthalmos ENT: moist mucous membranes, no thyromegaly, no cervical lymphadenopathy Cardiovascular: RRR, No MRG, + B LE edema Respiratory: CTA B Gastrointestinal: abdomen soft, NT, ND, BS+ Musculoskeletal: no deformities, strength intact in all 4 Skin: moist, warm, no rashes Neurological: no tremor with outstretched hands, DTR normal in all 4  ASSESSMENT: 1. DM2, insulin-dependent, uncontrolled, with long-term complications - CKD now stage 4 - PN  No personal history of pancreatitis or family history of medullary thyroid cancer.  2.  Hypothyroidism  PLAN:  1. DM2 Patient with longstanding, uncontrolled, type 2 diabetes, on oral antidiabetic regimen with Januvia as per her preference, switching from [place] as she felt that the injectable GLP-1 receptor agonist was elevating her sugars.  She went back to Januvia before last visit.  At that time, reviewing her meter download, sugars were fairly well controlled with an occasional hyperglycemic spike when she forgot her insulin, but also with few lower blood sugars in the 50s and 60s.  They were occurring after meals so we discussed about decreasing her NovoLog dose slightly.  I advised her to let me know if she has any more low blood sugars afterwards.  Her HbA1c was excellent, at 6.2%. -Since last visit, she was given a One Touch Verio reflect  - I suggested to:  Patient Instructions  Please continue: - Januvia 25 mg in a.m.before breakfast - Novolog 3x a day, before meals:  9-11 units before breakfast 9-11 units  before dinner  Please continue Unithroid 75 mcg daily: take this every day, with water, at least 30 minutes before breakfast, separated by at least 4 hours from: - acid reflux medications - calcium - iron - multivitamins  Please return in 3-4 months with your sugar log.   - we checked her HbA1c: 7%  - advised to check sugars at different times of the day - 3x a day, rotating check times - advised for yearly eye exams >> she is UTD - return to clinic in 3-4 months  2. Hypothyroidism - latest thyroid labs reviewed with pt. >> normal: Lab Results  Component Value Date   TSH 0.57 12/28/2019  - she continues on LT4 75 mcg daily - pt feels good on this dose. - we discussed about taking the thyroid hormone every day, with water, >30 minutes before breakfast, separated by >4 hours from acid reflux medications, calcium, iron, multivitamins. Pt. is taking it correctly. - will check thyroid tests today: TSH and fT4 - If labs are abnormal, she will need to return for repeat TFTs in 1.5 months  Philemon Kingdom, MD PhD [facility] Endocrinology

## 2020-11-30 ENCOUNTER — Other Ambulatory Visit: Payer: Self-pay | Admitting: Physician Assistant

## 2020-11-30 NOTE — Telephone Encounter (Signed)
Next Visit: 02/28/2021  Last Visit: 08/30/2020  Last Fill: 08/18/2020  DX: Chondrocalcinosis  Current Dose per office note 08/30/2020: not discussed  Labs: 09/11/2020 Hgb 10.6   Hct 33.7    MCH 26.4   Glucose 108   BUN 32   Creat. 1.64   GFR 32   A/G Ratio 2.3  Okay to refill Colchicine?

## 2020-12-04 DIAGNOSIS — N1832 Chronic kidney disease, stage 3b: Secondary | ICD-10-CM | POA: Diagnosis not present

## 2020-12-11 DIAGNOSIS — R3915 Urgency of urination: Secondary | ICD-10-CM | POA: Diagnosis not present

## 2020-12-11 DIAGNOSIS — R350 Frequency of micturition: Secondary | ICD-10-CM | POA: Diagnosis not present

## 2020-12-11 DIAGNOSIS — N3010 Interstitial cystitis (chronic) without hematuria: Secondary | ICD-10-CM | POA: Diagnosis not present

## 2020-12-11 DIAGNOSIS — R300 Dysuria: Secondary | ICD-10-CM | POA: Diagnosis not present

## 2020-12-12 ENCOUNTER — Other Ambulatory Visit: Payer: Self-pay | Admitting: Internal Medicine

## 2020-12-12 DIAGNOSIS — N2581 Secondary hyperparathyroidism of renal origin: Secondary | ICD-10-CM | POA: Diagnosis not present

## 2020-12-12 DIAGNOSIS — I129 Hypertensive chronic kidney disease with stage 1 through stage 4 chronic kidney disease, or unspecified chronic kidney disease: Secondary | ICD-10-CM | POA: Diagnosis not present

## 2020-12-12 DIAGNOSIS — D631 Anemia in chronic kidney disease: Secondary | ICD-10-CM | POA: Diagnosis not present

## 2020-12-12 DIAGNOSIS — N1832 Chronic kidney disease, stage 3b: Secondary | ICD-10-CM | POA: Diagnosis not present

## 2020-12-12 NOTE — Telephone Encounter (Signed)
Okay to send another supply per cma Tileshia

## 2020-12-12 NOTE — Telephone Encounter (Signed)
Vm left for patient to callback and schedule appt . Patient was a no show to appt scheduled 11/2020

## 2020-12-13 ENCOUNTER — Other Ambulatory Visit: Payer: Self-pay | Admitting: Internal Medicine

## 2020-12-22 DIAGNOSIS — M8008XD Age-related osteoporosis with current pathological fracture, vertebra(e), subsequent encounter for fracture with routine healing: Secondary | ICD-10-CM | POA: Diagnosis not present

## 2021-01-15 ENCOUNTER — Inpatient Hospital Stay: Payer: Medicare Other | Admitting: Hematology and Oncology

## 2021-01-15 ENCOUNTER — Inpatient Hospital Stay: Payer: Medicare Other

## 2021-01-18 ENCOUNTER — Ambulatory Visit: Payer: Medicare Other | Admitting: Hematology and Oncology

## 2021-01-18 ENCOUNTER — Ambulatory Visit: Payer: Medicare Other

## 2021-01-18 ENCOUNTER — Other Ambulatory Visit: Payer: Medicare Other

## 2021-01-22 ENCOUNTER — Ambulatory Visit: Payer: Medicare Other

## 2021-01-22 ENCOUNTER — Inpatient Hospital Stay: Payer: Medicare Other | Admitting: Hematology and Oncology

## 2021-01-22 ENCOUNTER — Other Ambulatory Visit: Payer: Medicare Other

## 2021-01-22 ENCOUNTER — Ambulatory Visit: Payer: Medicare Other | Admitting: Hematology and Oncology

## 2021-01-22 ENCOUNTER — Inpatient Hospital Stay: Payer: Medicare Other

## 2021-01-22 ENCOUNTER — Inpatient Hospital Stay: Payer: Medicare Other | Attending: Hematology and Oncology

## 2021-01-29 ENCOUNTER — Telehealth: Payer: Self-pay | Admitting: Hematology and Oncology

## 2021-01-29 NOTE — Telephone Encounter (Signed)
Rescheduled appointment. Patient aware.  

## 2021-01-30 ENCOUNTER — Inpatient Hospital Stay: Payer: Medicare Other | Admitting: Hematology and Oncology

## 2021-01-30 ENCOUNTER — Inpatient Hospital Stay: Payer: Medicare Other

## 2021-02-05 ENCOUNTER — Telehealth: Payer: Self-pay | Admitting: Pharmacist

## 2021-02-05 NOTE — Telephone Encounter (Signed)
Received Forteo PAP re-enrollment application from [facility]   Will place provider portion in Dr. Arlean Hopping folder to be signed (along with med list and insurance card copy)    Mailed patient her portion today to be completed and returned to pharmacy team with letter noting that income docs are required for renewal application  Forteo started 09/29/19     Knox Saliva, PharmD, MPH, BCPS Clinical Pharmacist (Rheumatology and Pulmonology)

## 2021-02-07 NOTE — Telephone Encounter (Signed)
Signed provider portion of [employer] PAP re-enrollment application received for Forteo.  Placed in "PAP pending info" folder in pharmacy office along with insurance card copy, med list, and PA approval letter  Awaiting patient portion to be returned.   Knox Saliva, PharmD, MPH, BCPS Clinical Pharmacist (Rheumatology and Pulmonology)

## 2021-02-09 ENCOUNTER — Other Ambulatory Visit: Payer: Medicare Other

## 2021-02-09 ENCOUNTER — Ambulatory Visit: Payer: Medicare Other

## 2021-02-12 ENCOUNTER — Ambulatory Visit: Payer: Medicare Other | Admitting: Internal Medicine

## 2021-02-13 ENCOUNTER — Other Ambulatory Visit: Payer: Medicare Other

## 2021-02-13 ENCOUNTER — Ambulatory Visit: Payer: Medicare Other | Admitting: Hematology and Oncology

## 2021-02-16 NOTE — Progress Notes (Deleted)
Office Visit Note  Patient: Abigail Hoover             Date of Birth: 03-Mar-1946           MRN: [id number]             PCP: [name], MD Referring: [name], MD Visit Date: 02/28/2021 Occupation: @GUAROCC @  Subjective:  No chief complaint on file.   History of Present Illness: Abigail Hoover is a 75 y.o. female ***   Activities of Daily Living:  Patient reports morning stiffness for *** {minute/hour:19697}.   Patient {ACTIONS;DENIES/REPORTS:21021675::"Denies"} nocturnal pain.  Difficulty dressing/grooming: {ACTIONS;DENIES/REPORTS:21021675::"Denies"} Difficulty climbing stairs: {ACTIONS;DENIES/REPORTS:21021675::"Denies"} Difficulty getting out of chair: {ACTIONS;DENIES/REPORTS:21021675::"Denies"} Difficulty using hands for taps, buttons, cutlery, and/or writing: {ACTIONS;DENIES/REPORTS:21021675::"Denies"}  No Rheumatology ROS completed.   PMFS History:  Patient Active Problem List   Diagnosis Date Noted   DDD (degenerative disc disease), lumbar 08/26/2019   Age-related osteoporosis without current pathological fracture 08/26/2019   Primary osteoarthritis of both knees 08/26/2019   Primary osteoarthritis of both hands 08/26/2019   Diabetes mellitus with chronic kidney disease, with long-term current use of insulin ([place]) 05/11/2019   Pain due to onychomycosis of toenails of both feet 09/30/2018   Chronic pain of right thumb 08/27/2018   Plantar fasciitis of left foot 05/19/2018   Lower limb pain, inferior, left 04/28/2018   Dry skin 03/12/2018   Other fatigue 09/18/2017   Pain and swelling of right wrist 08/06/2017   At high risk for breast cancer 03/06/2017   Hypercalcemia 12/16/2016   Vitamin B 12 deficiency 12/16/2016   Displaced fracture of left femoral neck ([place]) 03/02/2016   Hyperkalemia 03/01/2016   Femoral neck fracture ([place]) 02/29/2016   Anemia due to stage 3 (moderate) chronic renal failure treated with erythropoietin ([place]) 12/18/2015   Chronic  kidney disease, stage III (moderate) ([place]) 08/14/2015   Anemia in chronic kidney disease 05/01/2015   Acute coronary syndrome ([place]) 01/02/2015   Deficiency anemia 05/27/2014   Carpal tunnel syndrome 12/09/2013   Diabetic neuropathy ([place]) 12/09/2013   Type 2 diabetes mellitus with stage 4 chronic kidney disease, with long-term current use of insulin ([place]) 04/21/2013   Disturbance of skin sensation 04/21/2013   Hypothyroidism 10/30/2012   Breast mass in female-left 03/10/2012   Weight loss 11/28/2011   Family history of breast cancer 08/01/2010   Essential hypertension 02/01/2009   EDEMA LEGS 02/01/2009    Past Medical History:  Diagnosis Date   Anemia in chronic kidney disease 05/01/2015   Anxiety    Arthritis    Bladder irritation    Diabetes mellitus    GERD (gastroesophageal reflux disease)    Hypercholesterolemia    Hypertension    Hypothyroidism    Iron deficiency anemia, unspecified    Psoriasis    Transfusion history 03/05/2016   for postoperative (ORIF) anemia superimposed on chronic anemia    Family History  Problem Relation Age of Onset   Heart disease Mother        heart attack   Stroke Father        brain hemorrhage   Diabetes Brother    Breast cancer Sister    Cancer Sister        breast cancer   Past Surgical History:  Procedure Laterality Date   ABDOMINAL HYSTERECTOMY     TAH   BREAST BIOPSY     Left breast biopsy benign lesion   BREAST BIOPSY  03/11/2012   Procedure: BREAST BIOPSY;  Surgeon: Sherren Mocha  Adalberto Cole, MD;  Location: [place];  Service: General;  Laterality: Left;  remove left breast mass   BREAST EXCISIONAL BIOPSY Left    CARDIAC CATHETERIZATION N/A 01/02/2015   Procedure: Left Heart Cath and Coronary Angiography;  Surgeon: Charolette Forward, MD;  Location: [place] [facility];  Service: Cardiovascular;  Laterality: N/A;   CATARACT EXTRACTION     CERVICAL DISC SURGERY     COLONOSCOPY  2013   neg. next 2023.    EYE SURGERY     IR KYPHO EA ADDL  LEVEL THORACIC OR LUMBAR  09/09/2019   IR KYPHO LUMBAR INC FX REDUCE BONE BX UNI/BIL CANNULATION INC/IMAGING  09/09/2019   IR RADIOLOGIST EVAL & MGMT  09/08/2019   LEFT HIP SURGERY     TOTAL HIP ARTHROPLASTY Left 03/02/2016   Procedure: TOTAL HIP ARTHROPLASTY ANTERIOR APPROACH;  Surgeon: Rod Can, MD;  Location: [place];  Service: Orthopedics;  Laterality: Left;   Social History   Social History Narrative   Patient is married with one child.   Patient is right handed.   Patient has college education.   Patient drinks tea, two times a day   Immunization History  Administered Date(s) Administered   Fluad Quad(high Dose 65+) 12/22/2018   Influenza Split 11/13/2010   Influenza, High Dose Seasonal PF 01/25/2016, 12/24/2016   Influenza,inj,Quad PF,6+ Mos 12/29/2012, 12/20/2013, 01/16/2015   Pneumococcal Conjugate-13 01/16/2015   Pneumococcal Polysaccharide-23 03/24/2013     Objective: Vital Signs: There were no vitals taken for this visit.   Physical Exam   Musculoskeletal Exam: ***  CDAI Exam: CDAI Score: -- Patient Global: --; Provider Global: -- Swollen: --; Tender: -- Joint Exam 02/28/2021   No joint exam has been documented for this visit   There is currently no information documented on the homunculus. Go to the Rheumatology activity and complete the homunculus joint exam.  Investigation: No additional findings.  Imaging: No results found.  Recent Labs: Lab Results  Component Value Date   WBC 4.8 09/01/2020   HGB 11.1 (L) 09/01/2020   PLT 146 (L) 09/01/2020   NA 138 09/20/2019   K 4.4 09/20/2019   CL 100 09/20/2019   CO2 29 09/20/2019   GLUCOSE 98 09/20/2019   BUN 32 (H) 09/20/2019   CREATININE 2.18 (H) 09/20/2019   BILITOT 0.5 09/20/2019   ALKPHOS 68 04/29/2019   AST 16 09/20/2019   ALT 6 09/20/2019   PROT 6.0 (L) 09/20/2019   PROT 6.0 (L) 09/20/2019   ALBUMIN 4.0 04/29/2019   CALCIUM 9.9 09/20/2019   GFRAA 25 (L) 09/20/2019    Speciality  Comments: Forteo started 09/29/2019  Procedures:  No procedures performed Allergies: Atorvastatin and Ezetimibe   Assessment / Plan:     Visit Diagnoses: Age-related osteoporosis with current pathological fracture, sequela  Vertebral fracture, osteoporotic, sequela  Psoriasis  Chondrocalcinosis  Other secondary kyphosis, thoracolumbar region  DDD (degenerative disc disease), lumbar  Facet arthropathy, lumbar  Primary osteoarthritis of both hands  Primary osteoarthritis of both knees  Hyperuricemia  Displaced fracture of left femoral neck (HCC)  Essential hypertension  History of diabetes mellitus  History of hypothyroidism  Other diabetic neurological complication associated with type 2 diabetes mellitus ([place])  Orders: No orders of the defined types were placed in this encounter.  No orders of the defined types were placed in this encounter.   Face-to-face time spent with patient was *** minutes. Greater than 50% of time was spent in counseling and coordination of care.  Follow-Up Instructions: No follow-ups on file.   Ofilia Neas, PA-C  Note - This record has been created using Dragon software.  Chart creation errors have been sought, but may not always  have been located. Such creation errors do not reflect on  the standard of medical care.

## 2021-02-19 ENCOUNTER — Encounter: Payer: Self-pay | Admitting: Internal Medicine

## 2021-02-19 ENCOUNTER — Ambulatory Visit (INDEPENDENT_AMBULATORY_CARE_PROVIDER_SITE_OTHER): Payer: Medicare Other | Admitting: Internal Medicine

## 2021-02-19 ENCOUNTER — Other Ambulatory Visit: Payer: Self-pay

## 2021-02-19 VITALS — BP 118/78 | HR 64 | Ht <= 58 in | Wt 130.0 lb

## 2021-02-19 DIAGNOSIS — E1122 Type 2 diabetes mellitus with diabetic chronic kidney disease: Secondary | ICD-10-CM

## 2021-02-19 DIAGNOSIS — E039 Hypothyroidism, unspecified: Secondary | ICD-10-CM

## 2021-02-19 DIAGNOSIS — Z794 Long term (current) use of insulin: Secondary | ICD-10-CM | POA: Diagnosis not present

## 2021-02-19 DIAGNOSIS — N184 Chronic kidney disease, stage 4 (severe): Secondary | ICD-10-CM | POA: Diagnosis not present

## 2021-02-19 MED ORDER — NOVOLOG FLEXPEN 100 UNIT/ML ~~LOC~~ SOPN: PEN_INJECTOR | SUBCUTANEOUS | 3 refills | Status: AC

## 2021-02-19 NOTE — Patient Instructions (Signed)
Please continue: - Januvia 25 mg before b'fast  Decrease: - Novolog to 7-9 units before meals  Please return in 4 months with your sugar log.

## 2021-02-19 NOTE — Progress Notes (Signed)
Patient ID: Abigail Hoover, female   DOB: 10/05/1945, 75 y.o.   MRN: [id number]   This visit occurred during the SARS-CoV-2 public health emergency.  Safety protocols were in place, including screening questions prior to the visit, additional usage of staff PPE, and extensive cleaning of exam room while observing appropriate contact time as indicated for disinfecting solutions.    HPI: Abigail Hoover is a pleasant 75 y.o.-year-old female, initially referred by her PCP, Dr. Delfina Redwood, returning for follow-up for DM2, dx in her 32s, insulin-dependent since 05/2019, uncontrolled, with complications (CKD stage IV, PN) and hypothyroidism.  She previously saw Dr. Dwyane Dee in this office, last 07/2019.  Last visit 6.5 months ago.  Interim history: Since last OV, she changed from [place] to [place] in 03/2020.  No increased urination, blurry vision, nausea.  She continues to have leg swelling, despite taking Torsemide. No increased urination, blurry vision, nausea.  DM2:  Reviewed HbA1c levels: Lab Results  Component Value Date   HGBA1C 6.2 (A) 07/25/2020   HGBA1C 6.6 (A) 03/27/2020   HGBA1C 6.3 (A) 12/28/2019   HGBA1C 7.6 (H) 07/30/2019   HGBA1C 7.7 (H) 04/29/2019   HGBA1C 6.6 (H) 01/27/2019   HGBA1C 6.9 (H) 10/30/2018   HGBA1C 6.3 03/03/2018   HGBA1C 5.7 10/21/2017   HGBA1C 6.1 05/27/2017   Previously on: - Januvia 25 mg daily in a.m.  - Novolog 3x a day, before meals: 8-10 units before breakfast, 3 units before snack, 8-9 units before dinner Stopped Actos 2/2 edema. She was on Repaglinide (Prandin).  We changed to: - Ozempic 0.5 mg weekly in a.m. >> Januvia 25 mg before dinner - Novolog 3x a day, before meals:  10-13 >> 9-11 units before meals  Pt checks her sugars 3 times a day per review of her meter downloads (time is off on her meter): - am: 108-127, 202 >> 129 >> 119, 120 >> 56-166, 188 - 2h after b'fast: 131 (after 202) >>  316 (forgot insulin) >> 62-156, 223 - before lunch:  108-193, 212 >> 99-142, 155 >> 62-115, 205 >> 52-166 - 2h after lunch: n/c >> 133-188 >> 63-192 >> 63-146, 212 - before dinner: 123-198, 265 >> 70-184 (snack) >> 69-212 >> 53-116, 187 - 2h after dinner: 55-196 >> 53, 97-173 >> 55, 66-183, 219 >> n/c - bedtime: n/c - nighttime: n/c Lowest sugar was 49, 55 >> 53 >> 52; she has hypoglycemia awareness at 60s-70s.  Highest sugar was 265 >> 188 >> 316 >> 300s (forgot novolog).  Glucometer: One Probation officer Flex  Pt's meals are vegetarian: - Breakfast: indian bread, veggies, tea - Lunch: skips - tea - Dinner: indian bread, rice, soup, veggies - Snacks: no  -+ CKD- seeing Dr. Posey Pronto, last BUN/creatinine:  12/12/2020: 36/2.18, GFR 23, glucose 214 09/05/2020: 32/1.64, GFR 32, glucose 108 02/07/2020:  Glu 122, BUN/Cr 28/1.47. GFR 34-per records from Dr. Posey Pronto Lab Results  Component Value Date   BUN 32 (H) 09/20/2019   BUN 33 (H) 07/30/2019   CREATININE 2.18 (H) 09/20/2019   CREATININE 1.74 (H) 07/30/2019  Not on ACE inhibitor/ARB.  -+ HL: last set of lipids: Lab Results  Component Value Date   CHOL 155 07/30/2019   HDL 57.00 07/30/2019   LDLCALC 76 07/30/2019   TRIG 110.0 07/30/2019   CHOLHDL 3 07/30/2019  On pravastatin 20 mg daily.  She had a rash with Lipitor.  - last eye exam was in 02/2020. No DR reportedly.   - she has numbness and  tingling in her feet.  On gabapentin 300 mg for bladder.  On ASA 81.  Pt has FH of DM in brother, sister, mother, MGM.  Hypothyroidism:  Previously on Synthroid d.a.w. but currently on Unithroid d.a.w. (she prefers not to take generics).  Pt is on Synthroid  d.a.w. 75 mcg daily, taken: - in am - fasting - at least 30 min from b'fast - no calcium - no iron - no multivitamins - + Carafate at night. Prev. On Dexilant. Not on  Protonix. - not on Biotin  Reviewed TSH levels: Lab Results  Component Value Date   TSH 0.57 12/28/2019   TSH 0.20 (L) 07/30/2019   TSH 2.56 04/29/2019   TSH  1.97 01/27/2019   TSH 1.58 12/16/2018   TSH 10.00 (H) 10/30/2018   TSH 3.36 03/03/2018   TSH 1.64 10/21/2017   TSH 0.68 12/20/2016   TSH 1.63 07/26/2016   TSH 2.97 04/18/2016   TSH 0.32 (L) 01/19/2016   TSH 0.89 07/13/2015   TSH 0.97 12/07/2014   TSH 1.090 12/07/2014   Pt denies: - feeling nodules in neck - hoarseness - dysphagia - choking - SOB with lying down  No FH of thyroid cancer. No h/o radiation tx to head or neck.  No herbal supplements. No Biotin use. No recent steroids use.   She has osteoporosis, managed by Dr. Estanislado Pandy.  She is on [facility] 09/29/2019.  Also on vitamin D. Latest vitamin D level: 52.8 on 09/05/2020. She sees Dr. Simeon Craft such for anemia.  Sees Dr. Collene Mares.  ROS: + leg swelling + acid reflux + joint aches (back) + hair loss  I reviewed pt's medications, allergies, PMH, social hx, family hx, and changes were documented in the history of present illness. Otherwise, unchanged from my initial visit note.  Past Medical History:  Diagnosis Date   Anemia in chronic kidney disease 05/01/2015   Anxiety    Arthritis    Bladder irritation    Diabetes mellitus    GERD (gastroesophageal reflux disease)    Hypercholesterolemia    Hypertension    Hypothyroidism    Iron deficiency anemia, unspecified    Psoriasis    Transfusion history 03/05/2016   for postoperative (ORIF) anemia superimposed on chronic anemia   Past Surgical History:  Procedure Laterality Date   ABDOMINAL HYSTERECTOMY     TAH   BREAST BIOPSY     Left breast biopsy benign lesion   BREAST BIOPSY  03/11/2012   Procedure: BREAST BIOPSY;  Surgeon: Odis Hollingshead, MD;  Location: [facility];  Service: General;  Laterality: Left;  remove left breast mass   BREAST EXCISIONAL BIOPSY Left    CARDIAC CATHETERIZATION N/A 01/02/2015   Procedure: Left Heart Cath and Coronary Angiography;  Surgeon: Charolette Forward, MD;  Location: [facility];  Service: Cardiovascular;  Laterality: N/A;    CATARACT EXTRACTION     CERVICAL DISC SURGERY     COLONOSCOPY  2013   neg. next 2023.    EYE SURGERY     IR KYPHO EA ADDL LEVEL THORACIC OR LUMBAR  09/09/2019   IR KYPHO LUMBAR INC FX REDUCE BONE BX UNI/BIL CANNULATION INC/IMAGING  09/09/2019   IR RADIOLOGIST EVAL & MGMT  09/08/2019   LEFT HIP SURGERY     TOTAL HIP ARTHROPLASTY Left 03/02/2016   Procedure: TOTAL HIP ARTHROPLASTY ANTERIOR APPROACH;  Surgeon: Rod Can, MD;  Location: [facility];  Service: Orthopedics;  Laterality: Left;   Social History   Socioeconomic History  Marital status: Married    Spouse name: Not on file   Number of children: 1   Years of education: college   Highest education level: Not on file  Occupational History   Occupation: Homemaker  Tobacco Use   Smoking status: Never   Smokeless tobacco: Never  Vaping Use   Vaping Use: Never used  Substance and Sexual Activity   Alcohol use: No    Alcohol/week: 0.0 standard drinks   Drug use: No   Sexual activity: Not Currently  Other Topics Concern   Not on file  Social History Narrative   Patient is married with one child.   Patient is right handed.   Patient has college education.   Patient drinks tea, two times a day   Social Determinants of Health   Financial Resource Strain: Not on file  Food Insecurity: Not on file  Transportation Needs: Not on file  Physical Activity: Not on file  Stress: Not on file  Social Connections: Not on file  Intimate Partner Violence: Not on file   Current Outpatient Medications on File Prior to Visit  Medication Sig Dispense Refill   aspirin EC 81 MG tablet Take 81 mg by mouth daily. Take 1 tablet by mouth once daily.     CARAFATE 1 GM/10ML suspension TAKE 10ML BY MOUTH 4 (FOUR) TIMES A DAY BETWEEN MEALS AND AT BEDTIME     colchicine 0.6 MG tablet TAKE ONE-HALF TABLET (0.3 MG TOTAL) BY MOUTH DAILY. 15 tablet 2   FORTEO 620 MCG/2.48ML SOPN INJECT 20 MCG INTO THE SKIN DAILY. DISCARD AFTER 28 DAYS. 2.4 mL 5    furosemide (LASIX) 40 MG tablet Take 40 mg by mouth. Take 1 tablet by mouth three times per week.     gabapentin (NEURONTIN) 300 MG capsule Take 300 mg by mouth 3 (three) times daily.     glucose blood (ONETOUCH VERIO) test strip Use Onetouch verio test strips to check blood sugar three times daily. (90 day RX) 300 each 3   Insulin Pen Needle (PEN NEEDLES 3/16") 31G X 5 MM MISC Use 1 pen needle to inject Forteo daily. 100 each 2   Insulin Pen Needle 31G X 5 MM MISC Use with Novolog 100 each 1   Insulin Syringe-Needle U-100 25G X 5/8" 1 ML MISC USE DAILY TO INJECT INSULIN 100 each 3   Lancet Devices (ONE TOUCH DELICA LANCING DEV) MISC 1 each by Does not apply route daily. Use as instructed to check blood sugar once daily. 1 each 0   Lancets (ONETOUCH DELICA PLUS [id number]) MISC TEST 3 TIMES DAILY 100 each 2   metoprolol succinate (TOPROL-XL) 50 MG 24 hr tablet Take 1 tablet (50 mg total) by mouth daily. Take with or immediately following a meal. 30 tablet 3   mirabegron ER (MYRBETRIQ) 50 MG TB24 tablet Take by mouth.     NOVOLOG FLEXPEN 100 UNIT/ML FlexPen INJECT 12 UNITS UNDER THE SKIN BEFORE BREAKFAST, 4- 5 UNITS BEFORE LUNCH AND 12 UNITS BEFORE DINNER 15 mL 0   pantoprazole (PROTONIX) 40 MG tablet Take 40 mg by mouth daily.     pentosan polysulfate (ELMIRON) 100 MG capsule TAKE 1 CAPSULE (100 MG TOTAL) BY MOUTH 3 TIMES DAILY BEFORE MEALS.     pravastatin (PRAVACHOL) 20 MG tablet TAKE ONE TABLET BY MOUTH ONCE DAILY 30 tablet 0   sitaGLIPtin (JANUVIA) 25 MG tablet Take 1 tablet (25 mg total) by mouth daily. 90 tablet 3   SYNTHROID 75 MCG tablet  TAKE 1 TABLET(75 MCG) BY MOUTH DAILY 90 tablet 2   No current facility-administered medications on file prior to visit.   Allergies  Allergen Reactions   Atorvastatin Rash   Ezetimibe Itching   Family History  Problem Relation Age of Onset   Heart disease Mother        heart attack   Stroke Father        brain hemorrhage   Diabetes Brother     Breast cancer Sister    Cancer Sister        breast cancer    PE: There were no vitals taken for this visit. Wt Readings from Last 3 Encounters:  09/01/20 123 lb 12.8 oz (56.2 kg)  08/30/20 125 lb (56.7 kg)  07/25/20 126 lb (57.2 kg)   Constitutional: normal weight, in NAD Eyes: PERRLA, EOMI, no exophthalmos ENT: moist mucous membranes, no thyromegaly, no cervical lymphadenopathy Cardiovascular: RRR, No MRG, + B LE edema Respiratory: CTA B Musculoskeletal: no deformities, strength intact in all 4 Skin: moist, warm, no rashes Neurological: no tremor with outstretched hands, DTR normal in all 4  ASSESSMENT: 1. DM2, insulin-dependent, uncontrolled, with long-term complications - CKD now stage 4 - PN  No personal history of pancreatitis or family history of medullary thyroid cancer.  2.  Hypothyroidism  PLAN:  1. DM2 Patient with longstanding, uncontrolled, type 2 diabetes, on injectable antidiabetic regimen with mealtime insulin only and previously on GLP-1 agonist, but changed back to Januvia per her preference before last visit.  We have her on a low-dose of Januvia due to declining kidney function. -At today's visit reviewing her meter download, sugars appear to be on the low end, with quite frequent values in the 50s and 60s.  She has few values in the low 200s.  She mentions that her sugars may increase to 300 if she forgets her insulin. -We discussed about reducing NovoLog further, from 9 to 11 units to 7to 9 units.  I advised her to only keep the 9 unit dose for larger meals.  As of now, she is using 11 units before large meal.  I advised her to let me know if she has any more low blood sugars after these changes. - I suggested to:  Patient Instructions  Please continue: - Januvia 25 mg before b'fast  Decrease: - Novolog to 7-9 units before meals  Please return in 4 months with your sugar log.   - we checked her HbA1c: 6.6% (slightly higher) - advised to check sugars  at different times of the day - 3-4x a day, rotating check times - advised for yearly eye exams >> she is UTD - at last visit, she declined having a lipid panel checked as she wanted to get them checked by nephrology.  However, reviewing records, she did not have this checked yet. -I reviewed her most recent kidney function from 11/2020 and this was worse, 23 -We will check a lipid panel today - return to clinic in 3-4 months  2. Hypothyroidism - latest thyroid labs reviewed with pt. >> normal: Lab Results  Component Value Date   TSH 0.57 12/28/2019  - she continues on LT4 75 mcg daily (Unithroid d.a.w.) - pt feels good on this dose. - we discussed about taking the thyroid hormone every day, with water, >30 minutes before breakfast, separated by >4 hours from acid reflux medications, calcium, iron, multivitamins. Pt. is taking it correctly. - will check thyroid tests today: TSH and fT4 - If labs  are abnormal, she will need to return for repeat TFTs in 1.5 months  Philemon Kingdom, MD PhD [facility] Endocrinology

## 2021-02-27 ENCOUNTER — Inpatient Hospital Stay: Payer: Medicare Other | Attending: Hematology and Oncology

## 2021-02-27 ENCOUNTER — Other Ambulatory Visit: Payer: Self-pay

## 2021-02-27 ENCOUNTER — Inpatient Hospital Stay (HOSPITAL_BASED_OUTPATIENT_CLINIC_OR_DEPARTMENT_OTHER): Payer: Medicare Other | Admitting: Hematology and Oncology

## 2021-02-27 ENCOUNTER — Encounter: Payer: Self-pay | Admitting: Hematology and Oncology

## 2021-02-27 ENCOUNTER — Inpatient Hospital Stay: Payer: Medicare Other

## 2021-02-27 VITALS — BP 127/51 | HR 65 | Temp 97.8°F | Resp 18 | Ht <= 58 in | Wt 123.4 lb

## 2021-02-27 DIAGNOSIS — Z803 Family history of malignant neoplasm of breast: Secondary | ICD-10-CM | POA: Insufficient documentation

## 2021-02-27 DIAGNOSIS — Z79899 Other long term (current) drug therapy: Secondary | ICD-10-CM | POA: Diagnosis not present

## 2021-02-27 DIAGNOSIS — E538 Deficiency of other specified B group vitamins: Secondary | ICD-10-CM | POA: Insufficient documentation

## 2021-02-27 DIAGNOSIS — N183 Chronic kidney disease, stage 3 unspecified: Secondary | ICD-10-CM | POA: Diagnosis not present

## 2021-02-27 DIAGNOSIS — D631 Anemia in chronic kidney disease: Secondary | ICD-10-CM | POA: Diagnosis not present

## 2021-02-27 DIAGNOSIS — Z9189 Other specified personal risk factors, not elsewhere classified: Secondary | ICD-10-CM | POA: Diagnosis not present

## 2021-02-27 DIAGNOSIS — N189 Chronic kidney disease, unspecified: Secondary | ICD-10-CM

## 2021-02-27 DIAGNOSIS — E039 Hypothyroidism, unspecified: Secondary | ICD-10-CM | POA: Diagnosis not present

## 2021-02-27 DIAGNOSIS — N1832 Chronic kidney disease, stage 3b: Secondary | ICD-10-CM | POA: Diagnosis not present

## 2021-02-27 DIAGNOSIS — D539 Nutritional anemia, unspecified: Secondary | ICD-10-CM

## 2021-02-27 LAB — CBC WITH DIFFERENTIAL/PLATELET
Abs Immature Granulocytes: 0.01 10*3/uL (ref 0.00–0.07)
Basophils Absolute: 0 10*3/uL (ref 0.0–0.1)
Basophils Relative: 0 %
Eosinophils Absolute: 0.5 10*3/uL (ref 0.0–0.5)
Eosinophils Relative: 8 %
HCT: 32.5 % — ABNORMAL LOW (ref 36.0–46.0)
Hemoglobin: 10.3 g/dL — ABNORMAL LOW (ref 12.0–15.0)
Immature Granulocytes: 0 %
Lymphocytes Relative: 28 %
Lymphs Abs: 1.8 10*3/uL (ref 0.7–4.0)
MCH: 26.5 pg (ref 26.0–34.0)
MCHC: 31.7 g/dL (ref 30.0–36.0)
MCV: 83.8 fL (ref 80.0–100.0)
Monocytes Absolute: 0.5 10*3/uL (ref 0.1–1.0)
Monocytes Relative: 7 %
Neutro Abs: 3.7 10*3/uL (ref 1.7–7.7)
Neutrophils Relative %: 57 %
Platelets: 191 10*3/uL (ref 150–400)
RBC: 3.88 MIL/uL (ref 3.87–5.11)
RDW: 14.1 % (ref 11.5–15.5)
WBC: 6.6 10*3/uL (ref 4.0–10.5)
nRBC: 0 % (ref 0.0–0.2)

## 2021-02-27 MED ORDER — CYANOCOBALAMIN 1000 MCG/ML IJ SOLN
1000.0000 ug | Freq: Once | INTRAMUSCULAR | Status: AC
  Administered 2021-02-27: 1000 ug via INTRAMUSCULAR
  Filled 2021-02-27: qty 1

## 2021-02-27 NOTE — Progress Notes (Signed)
[name]  [name], [name], MD  ASSESSMENT & PLAN:  Anemia in chronic kidney disease She has multifactorial anemia She has combined recurrent iron deficiency anemia, B12 deficiency as well as anemia chronic kidney disease She is asymptomatic Based on the trend, she will only need erythropoietin stimulating agent every 6 months I will see her again in 6 months for further follow-up  Vitamin B 12 deficiency She has history of B12 deficiency Her B12 level earlier this year was adequate I have space out the frequency of B12 injections We will recheck a B12 level next year  At high risk for breast cancer She has family history of breast cancer and is concerned Her breast exam is normal and she is reassured  Orders Placed This Encounter  Procedures   Basic Metabolic Panel - [place] Only    Standing Status:   Future    Standing Expiration Date:   02/27/2022   CBC with Differential/Platelet    Standing Status:   Standing    Number of Occurrences:   22    Standing Expiration Date:   02/27/2022   Ferritin    Standing Status:   Future    Standing Expiration Date:   02/27/2022   Iron and TIBC    Standing Status:   Future    Standing Expiration Date:   02/27/2022   Vitamin B12    Standing Status:   Future    Standing Expiration Date:   02/27/2022   Sedimentation rate    Standing Status:   Future    Standing Expiration Date:   02/27/2022   Reticulocytes    Standing Status:   Future    Standing Expiration Date:   02/27/2022   Erythropoietin    Standing Status:   Future    Standing Expiration Date:   02/27/2022    The total time spent in the appointment was 20 minutes encounter with patients including review of chart and various tests results, discussions about plan of care and coordination of care plan   All questions were answered. The patient knows to call the clinic with any problems, questions or concerns. No barriers to learning was  detected.    [name], MD 12/6/202210:24 AM  INTERVAL HISTORY: Abigail Hoover 75 y.o. female returns for follow-up on anemia of chronic kidney disease, B12 deficiency and family history of breast cancer She is doing well Her appointment was rescheduled twice due to scheduling issue She denies any recent abnormal breast examination, palpable mass, abnormal breast appearance or nipple changes   SUMMARY OF HEMATOLOGIC HISTORY:  This is a patient that has been followed here since 2011 for chronic anemia In 2011, she had a bone marrow aspirate and biopsy that came back normal. The patient has been receiving Aranesp injection for chronic anemia up until 2014. It was subsequently discontinued due stability of the anemia. Starting 05/27/2014, darbepoetin was resumed at 200 g every other month to keep hemoglobin greater than 10 g. Treatment was discontinued in October 2016 and resumed in September 2017 The patient is subsequently found to have severe vitamin B12 deficiency and is started on vitamin B12 injection on December 25, 2016 along with resumption of darbepoetin injection every 5 to 6 months.  She is also seen due to family history of breast cancer She also required intravenous iron infusion intermittently for concurrent iron deficiency anemia  I have reviewed the past medical history, past surgical history, social history and family  history with the patient and they are unchanged from previous note.  ALLERGIES:  is allergic to atorvastatin and ezetimibe.  MEDICATIONS:  Current Outpatient Medications  Medication Sig Dispense Refill   aspirin EC 81 MG tablet Take 81 mg by mouth daily. Take 1 tablet by mouth once daily.     CARAFATE 1 GM/10ML suspension TAKE 10ML BY MOUTH 4 (FOUR) TIMES A DAY BETWEEN MEALS AND AT BEDTIME     colchicine 0.6 MG tablet TAKE ONE-HALF TABLET (0.3 MG TOTAL) BY MOUTH DAILY. 15 tablet 2   FORTEO 620 MCG/2.48ML SOPN INJECT 20 MCG INTO THE SKIN DAILY. DISCARD  AFTER 28 DAYS. 2.4 mL 5   furosemide (LASIX) 40 MG tablet Take 40 mg by mouth. Take 1 tablet by mouth three times per week.     gabapentin (NEURONTIN) 300 MG capsule Take 300 mg by mouth 3 (three) times daily.     glucose blood (ONETOUCH VERIO) test strip Use Onetouch verio test strips to check blood sugar three times daily. (90 day RX) 300 each 3   Insulin Pen Needle (PEN NEEDLES 3/16") 31G X 5 MM MISC Use 1 pen needle to inject Forteo daily. 100 each 2   Insulin Pen Needle 31G X 5 MM MISC Use with Novolog 100 each 1   Insulin Syringe-Needle U-100 25G X 5/8" 1 ML MISC USE DAILY TO INJECT INSULIN 100 each 3   Lancet Devices (ONE TOUCH DELICA LANCING DEV) MISC 1 each by Does not apply route daily. Use as instructed to check blood sugar once daily. 1 each 0   Lancets (ONETOUCH DELICA PLUS [id number]) MISC TEST 3 TIMES DAILY 100 each 2   metoprolol succinate (TOPROL-XL) 50 MG 24 hr tablet Take 1 tablet (50 mg total) by mouth daily. Take with or immediately following a meal. 30 tablet 3   mirabegron ER (MYRBETRIQ) 50 MG TB24 tablet Take by mouth.     NOVOLOG FLEXPEN 100 UNIT/ML FlexPen INJECT 9-11 UNITS UNDER THE SKIN BEFORE the 3 meals 30 mL 3   pantoprazole (PROTONIX) 40 MG tablet Take 40 mg by mouth daily.     pentosan polysulfate (ELMIRON) 100 MG capsule TAKE 1 CAPSULE (100 MG TOTAL) BY MOUTH 3 TIMES DAILY BEFORE MEALS.     pravastatin (PRAVACHOL) 20 MG tablet TAKE ONE TABLET BY MOUTH ONCE DAILY 30 tablet 0   sitaGLIPtin (JANUVIA) 25 MG tablet Take 1 tablet (25 mg total) by mouth daily. 90 tablet 3   SYNTHROID 75 MCG tablet TAKE 1 TABLET(75 MCG) BY MOUTH DAILY 90 tablet 2   No current facility-administered medications for this visit.     REVIEW OF SYSTEMS:   Constitutional: Denies fevers, chills or night sweats Eyes: Denies blurriness of vision Ears, nose, mouth, throat, and face: Denies mucositis or sore throat Respiratory: Denies cough, dyspnea or wheezes Cardiovascular: Denies  palpitation, chest discomfort or lower extremity swelling Gastrointestinal:  Denies nausea, heartburn or change in bowel habits Skin: Denies abnormal skin rashes Lymphatics: Denies new lymphadenopathy or easy bruising Neurological:Denies numbness, tingling or new weaknesses Behavioral/Psych: Mood is stable, no new changes  All other systems were reviewed with the patient and are negative.  PHYSICAL EXAMINATION: ECOG PERFORMANCE STATUS: 1 - Symptomatic but completely ambulatory  Vitals:   02/27/21 0830  BP: (!) 127/51  Pulse: 65  Resp: 18  Temp: 97.8 F (36.6 C)  SpO2: 98%   Filed Weights   02/27/21 0830  Weight: 123 lb 6.4 oz (56 kg)    GENERAL:alert, no  distress and comfortable SKIN: skin color, texture, turgor are normal, no rashes or significant lesions EYES: normal, Conjunctiva are pink and non-injected, sclera clear OROPHARYNX:no exudate, no erythema and lips, buccal mucosa, and tongue normal  NECK: supple, thyroid normal size, non-tender, without nodularity LYMPH:  no palpable lymphadenopathy in the cervical, axillary or inguinal LUNGS: clear to auscultation and percussion with normal breathing effort HEART: regular rate & rhythm and no murmurs and no lower extremity edema ABDOMEN:abdomen soft, non-tender and normal bowel sounds Musculoskeletal:no cyanosis of digits and no clubbing  NEURO: alert & oriented x 3 with fluent speech, no focal motor/sensory deficits She has normal breast exam  LABORATORY DATA:  I have reviewed the data as listed     Component Value Date/Time   NA 138 09/20/2019 1506   NA 138 12/12/2016 1150   K 4.4 09/20/2019 1506   K 4.6 12/12/2016 1150   CL 100 09/20/2019 1506   CL 103 07/27/2012 1259   CO2 29 09/20/2019 1506   CO2 31 (H) 12/12/2016 1150   GLUCOSE 98 09/20/2019 1506   GLUCOSE 149 (H) 12/12/2016 1150   GLUCOSE 118 (H) 07/27/2012 1259   BUN 32 (H) 09/20/2019 1506   BUN 23.4 12/12/2016 1150   CREATININE 2.18 (H) 09/20/2019 1506    CREATININE 1.7 (H) 12/12/2016 1150   CALCIUM 9.9 09/20/2019 1506   CALCIUM 11.4 (H) 12/12/2016 1150   PROT 6.0 (L) 09/20/2019 1506   PROT 6.0 (L) 09/20/2019 1506   PROT 6.5 12/12/2016 1150   ALBUMIN 4.0 04/29/2019 1451   ALBUMIN 3.9 12/12/2016 1150   AST 16 09/20/2019 1506   AST 19 12/12/2016 1150   ALT 6 09/20/2019 1506   ALT 10 12/12/2016 1150   ALKPHOS 68 04/29/2019 1451   ALKPHOS 65 12/12/2016 1150   BILITOT 0.5 09/20/2019 1506   BILITOT 0.36 12/12/2016 1150   GFRNONAA 21 (L) 09/20/2019 1506   GFRAA 25 (L) 09/20/2019 1506    No results found for: SPEP, UPEP  Lab Results  Component Value Date   WBC 6.6 02/27/2021   NEUTROABS 3.7 02/27/2021   HGB 10.3 (L) 02/27/2021   HCT 32.5 (L) 02/27/2021   MCV 83.8 02/27/2021   PLT 191 02/27/2021      Chemistry      Component Value Date/Time   NA 138 09/20/2019 1506   NA 138 12/12/2016 1150   K 4.4 09/20/2019 1506   K 4.6 12/12/2016 1150   CL 100 09/20/2019 1506   CL 103 07/27/2012 1259   CO2 29 09/20/2019 1506   CO2 31 (H) 12/12/2016 1150   BUN 32 (H) 09/20/2019 1506   BUN 23.4 12/12/2016 1150   CREATININE 2.18 (H) 09/20/2019 1506   CREATININE 1.7 (H) 12/12/2016 1150      Component Value Date/Time   CALCIUM 9.9 09/20/2019 1506   CALCIUM 11.4 (H) 12/12/2016 1150   ALKPHOS 68 04/29/2019 1451   ALKPHOS 65 12/12/2016 1150   AST 16 09/20/2019 1506   AST 19 12/12/2016 1150   ALT 6 09/20/2019 1506   ALT 10 12/12/2016 1150   BILITOT 0.5 09/20/2019 1506   BILITOT 0.36 12/12/2016 1150

## 2021-02-27 NOTE — Assessment & Plan Note (Signed)
She has history of B12 deficiency Her B12 level earlier this year was adequate I have space out the frequency of B12 injections We will recheck a B12 level next year

## 2021-02-27 NOTE — Assessment & Plan Note (Signed)
She has family history of breast cancer and is concerned Her breast exam is normal and she is reassured

## 2021-02-27 NOTE — Assessment & Plan Note (Signed)
She has multifactorial anemia She has combined recurrent iron deficiency anemia, B12 deficiency as well as anemia chronic kidney disease She is asymptomatic Based on the trend, she will only need erythropoietin stimulating agent every 6 months I will see her again in 6 months for further follow-up

## 2021-02-28 ENCOUNTER — Ambulatory Visit: Payer: Medicare Other | Admitting: Rheumatology

## 2021-02-28 DIAGNOSIS — M1120 Other chondrocalcinosis, unspecified site: Secondary | ICD-10-CM

## 2021-02-28 DIAGNOSIS — E790 Hyperuricemia without signs of inflammatory arthritis and tophaceous disease: Secondary | ICD-10-CM

## 2021-02-28 DIAGNOSIS — Z8639 Personal history of other endocrine, nutritional and metabolic disease: Secondary | ICD-10-CM

## 2021-02-28 DIAGNOSIS — M4015 Other secondary kyphosis, thoracolumbar region: Secondary | ICD-10-CM

## 2021-02-28 DIAGNOSIS — M47816 Spondylosis without myelopathy or radiculopathy, lumbar region: Secondary | ICD-10-CM

## 2021-02-28 DIAGNOSIS — M8008XS Age-related osteoporosis with current pathological fracture, vertebra(e), sequela: Secondary | ICD-10-CM

## 2021-02-28 DIAGNOSIS — I10 Essential (primary) hypertension: Secondary | ICD-10-CM

## 2021-02-28 DIAGNOSIS — S72002A Fracture of unspecified part of neck of left femur, initial encounter for closed fracture: Secondary | ICD-10-CM

## 2021-02-28 DIAGNOSIS — M170 Bilateral primary osteoarthritis of knee: Secondary | ICD-10-CM

## 2021-02-28 DIAGNOSIS — M19041 Primary osteoarthritis, right hand: Secondary | ICD-10-CM

## 2021-02-28 DIAGNOSIS — E1149 Type 2 diabetes mellitus with other diabetic neurological complication: Secondary | ICD-10-CM

## 2021-02-28 DIAGNOSIS — N183 Chronic kidney disease, stage 3 unspecified: Secondary | ICD-10-CM

## 2021-02-28 DIAGNOSIS — M8000XS Age-related osteoporosis with current pathological fracture, unspecified site, sequela: Secondary | ICD-10-CM

## 2021-02-28 DIAGNOSIS — M5136 Other intervertebral disc degeneration, lumbar region: Secondary | ICD-10-CM

## 2021-02-28 DIAGNOSIS — L409 Psoriasis, unspecified: Secondary | ICD-10-CM

## 2021-02-28 NOTE — Progress Notes (Addendum)
Office Visit Note  Patient: Abigail Hoover             Date of Birth: 1945/09/01           MRN: [id number]             PCP: [name], MD Referring: [name], MD Visit Date: 03/14/2021 Occupation: @GUAROCC @  Subjective:  Medication monitoring   History of Present Illness: Abigail Hoover is a 75 y.o. female with a history of osteoporosis, osteoarthritis and degenerative disc disease.  She states she has been taking Forteo injections on a regular basis since July 2021.  She has been also taking calcium rich diet.  She continues to have discomfort in her neck and lower back.  She states she stands for long hours every day and does house chores which is very difficult as it bothers her back and her knee joints.  She has not noticed any joint swelling.  She denies any active psoriasis lesions.  Activities of Daily Living:  Patient reports morning stiffness for 0 minutes.   Patient Reports nocturnal pain.  Difficulty dressing/grooming: Denies Difficulty climbing stairs: Denies Difficulty getting out of chair: Denies Difficulty using hands for taps, buttons, cutlery, and/or writing: Reports  Review of Systems  Constitutional:  Negative for fatigue.  HENT:  Negative for mouth sores, mouth dryness and nose dryness.   Eyes:  Negative for pain, itching and dryness.  Respiratory:  Negative for shortness of breath and difficulty breathing.   Cardiovascular:  Negative for chest pain and palpitations.  Gastrointestinal:  Negative for blood in stool, constipation and diarrhea.  Endocrine: Negative for increased urination.  Genitourinary:  Negative for difficulty urinating.  Musculoskeletal:  Positive for joint pain and joint pain. Negative for joint swelling, myalgias, morning stiffness, muscle tenderness and myalgias.  Skin:  Negative for color change, rash and redness.  Allergic/Immunologic: Negative for susceptible to infections.  Neurological:  Positive for memory loss.  Negative for dizziness, numbness, headaches and weakness.  Hematological:  Negative for bruising/bleeding tendency.  Psychiatric/Behavioral:  Negative for confusion.    PMFS History:  Patient Active Problem List   Diagnosis Date Noted   DDD (degenerative disc disease), lumbar 08/26/2019   Age-related osteoporosis without current pathological fracture 08/26/2019   Primary osteoarthritis of both knees 08/26/2019   Primary osteoarthritis of both hands 08/26/2019   Diabetes mellitus with chronic kidney disease, with long-term current use of insulin ([place]) 05/11/2019   Pain due to onychomycosis of toenails of both feet 09/30/2018   Chronic pain of right thumb 08/27/2018   Plantar fasciitis of left foot 05/19/2018   Lower limb pain, inferior, left 04/28/2018   Dry skin 03/12/2018   Other fatigue 09/18/2017   Pain and swelling of right wrist 08/06/2017   At high risk for breast cancer 03/06/2017   Hypercalcemia 12/16/2016   Vitamin B 12 deficiency 12/16/2016   Displaced fracture of left femoral neck ([place]) 03/02/2016   Hyperkalemia 03/01/2016   Femoral neck fracture ([place]) 02/29/2016   Anemia due to stage 3 (moderate) chronic renal failure treated with erythropoietin ([place]) 12/18/2015   Chronic kidney disease, stage III (moderate) ([place]) 08/14/2015   Anemia in chronic kidney disease 05/01/2015   Acute coronary syndrome ([place]) 01/02/2015   Deficiency anemia 05/27/2014   Carpal tunnel syndrome 12/09/2013   Diabetic neuropathy ([place]) 12/09/2013   Type 2 diabetes mellitus with stage 4 chronic kidney disease, with long-term current use of insulin ([place]) 04/21/2013   Disturbance of skin sensation  04/21/2013   Hypothyroidism 10/30/2012   Breast mass in female-left 03/10/2012   Weight loss 11/28/2011   Family history of breast cancer 08/01/2010   Essential hypertension 02/01/2009   EDEMA LEGS 02/01/2009    Past Medical History:  Diagnosis Date   Anemia in chronic kidney disease 05/01/2015    Anxiety    Arthritis    Bladder irritation    Diabetes mellitus    GERD (gastroesophageal reflux disease)    Hypercholesterolemia    Hypertension    Hypothyroidism    Iron deficiency anemia, unspecified    Psoriasis    Transfusion history 03/05/2016   for postoperative (ORIF) anemia superimposed on chronic anemia    Family History  Problem Relation Age of Onset   Heart disease Mother        heart attack   Stroke Father        brain hemorrhage   Diabetes Brother    Breast cancer Sister    Cancer Sister        breast cancer   Past Surgical History:  Procedure Laterality Date   ABDOMINAL HYSTERECTOMY     TAH   BREAST BIOPSY     Left breast biopsy benign lesion   BREAST BIOPSY  03/11/2012   Procedure: BREAST BIOPSY;  Surgeon: Odis Hollingshead, MD;  Location: [facility];  Service: General;  Laterality: Left;  remove left breast mass   BREAST EXCISIONAL BIOPSY Left    CARDIAC CATHETERIZATION N/A 01/02/2015   Procedure: Left Heart Cath and Coronary Angiography;  Surgeon: Charolette Forward, MD;  Location: [facility];  Service: Cardiovascular;  Laterality: N/A;   CATARACT EXTRACTION     CERVICAL DISC SURGERY     COLONOSCOPY  2013   neg. next 2023.    EYE SURGERY     IR KYPHO EA ADDL LEVEL THORACIC OR LUMBAR  09/09/2019   IR KYPHO LUMBAR INC FX REDUCE BONE BX UNI/BIL CANNULATION INC/IMAGING  09/09/2019   IR RADIOLOGIST EVAL & MGMT  09/08/2019   LEFT HIP SURGERY     TOTAL HIP ARTHROPLASTY Left 03/02/2016   Procedure: TOTAL HIP ARTHROPLASTY ANTERIOR APPROACH;  Surgeon: Rod Can, MD;  Location: [facility];  Service: Orthopedics;  Laterality: Left;   Social History   Social History Narrative   Patient is married with one child.   Patient is right handed.   Patient has college education.   Patient drinks tea, two times a day   Immunization History  Administered Date(s) Administered   Fluad Quad(high Dose 65+) 12/22/2018   Influenza Split 11/13/2010   Influenza, High Dose  Seasonal PF 01/25/2016, 12/24/2016   Influenza,inj,Quad PF,6+ Mos 12/29/2012, 12/20/2013, 01/16/2015   Pneumococcal Conjugate-13 01/16/2015   Pneumococcal Polysaccharide-23 03/24/2013     Objective: Vital Signs: BP (!) 98/59 (BP Location: Left Arm, Patient Position: Sitting, Cuff Size: Normal)   Pulse 69   Ht 4\' 8"  (1.422 m)   Wt 126 lb 6.4 oz (57.3 kg)   BMI 28.34 kg/m    Physical Exam Vitals and nursing note reviewed.  Constitutional:      Appearance: She is well-developed.  HENT:     Head: Normocephalic and atraumatic.  Eyes:     Conjunctiva/sclera: Conjunctivae normal.  Cardiovascular:     Rate and Rhythm: Normal rate and regular rhythm.     Heart sounds: Normal heart sounds.  Pulmonary:     Effort: Pulmonary effort is normal.     Breath sounds: Normal breath sounds.  Abdominal:  General: Bowel sounds are normal.     Palpations: Abdomen is soft.  Musculoskeletal:     Cervical back: Normal range of motion.  Lymphadenopathy:     Cervical: No cervical adenopathy.  Skin:    General: Skin is warm and dry.     Capillary Refill: Capillary refill takes less than 2 seconds.  Neurological:     Mental Status: She is alert and oriented to person, place, and time.  Psychiatric:        Behavior: Behavior normal.     Musculoskeletal Exam: C-spine was in good range of motion.  She had thoracic kyphosis.  She had limited painful range of motion of her lumbar spine.  Bilateral shoulder abduction was limited 220 degrees..  Elbow joints, wrist joints, MCPs PIPs and DIPs with good range of motion with no synovitis.  She had had a limited range of motion of her hip joints.  Knee joints were in good range of motion.  There was no tenderness over ankles or MTPs.  CDAI Exam: CDAI Score: -- Patient Global: --; Provider Global: -- Swollen: --; Tender: -- Joint Exam 03/14/2021   No joint exam has been documented for this visit   There is currently no information documented on the  homunculus. Go to the Rheumatology activity and complete the homunculus joint exam.  Investigation: No additional findings.  Imaging: No results found.  Recent Labs: Lab Results  Component Value Date   WBC 6.6 02/27/2021   HGB 10.3 (L) 02/27/2021   PLT 191 02/27/2021   NA 138 09/20/2019   K 4.4 09/20/2019   CL 100 09/20/2019   CO2 29 09/20/2019   GLUCOSE 98 09/20/2019   BUN 32 (H) 09/20/2019   CREATININE 2.18 (H) 09/20/2019   BILITOT 0.5 09/20/2019   ALKPHOS 68 04/29/2019   AST 16 09/20/2019   ALT 6 09/20/2019   PROT 6.0 (L) 09/20/2019   PROT 6.0 (L) 09/20/2019   ALBUMIN 4.0 04/29/2019   CALCIUM 9.9 09/20/2019   GFRAA 25 (L) 09/20/2019    Speciality Comments: Forteo started 09/29/2019  Addendum: We received labs from Dr. Serita Grit office February 27, 2021 TSH normal, LDL 91, PTH 46, vitamin D33.7, UA negative, CMP creatinine 1.93, GFR 26, calcium 10.3, magnesium 1.7, CBC WBC 6.6, hemoglobin 10.4, platelets 197  Procedures:  No procedures performed Allergies: Atorvastatin and Ezetimibe   Assessment / Plan:     Visit Diagnoses: Age-related osteoporosis with current pathological fracture, sequela - DXADate of Scan: 09/22/2019 Lowest T-score and site measured: -2.2 Right Femoral Neck. started on Forteo on September 29, 2019.  She will continue Forteo until July 2022.  She has been tolerating medication without any side effects.  Calcium rich diet was discussed.  We also discussed possibly starting on Prolia injections after Forteo was finished.  I had brief discussion regarding Prolia.  Other secondary kyphosis, thoracolumbar region-stretching exercises were discussed.  Vertebral fracture, osteoporotic, sequela - status post kyphoplasty.   Displaced fracture of left femoral neck (HCC)  DDD (degenerative disc disease), lumbar-she continues to have some lower back pain.  She is not interested in physical therapy.  Facet arthropathy, lumbar-chronic discomfort.  Primary  osteoarthritis of both hands-she has PIP and DIP thickening but no synovitis was noted.  Primary osteoarthritis of both knees - bilateral moderate osteoarthritis and mild chondromalacia patella.  She has chronic discomfort.  She states she stands for many hours every day due to house chores.  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  Hyperuricemia-she has not had any gout flares.  Psoriasis-she had no active psoriasis lesions.  Essential hypertension  History of diabetes mellitus  Hypothyroidism, unspecified type - followed by Dr. Dwyane Dee.  Other diabetic neurological complication associated with type 2 diabetes mellitus (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 Graylon Gunning.  Patient states she gets labs with Dr. Posey Pronto every 3 months.  We will request labs.  Orders: No orders of the defined types were placed in this encounter.  No orders of the defined types were placed in this encounter.    Follow-Up Instructions: Return in about 6 months (around 09/12/2021) for Osteoporosis.   Bo Merino, MD  Note - This record has been created using Editor, commissioning.  Chart creation errors have been sought, but may not always  have been located. Such creation errors do not reflect on  the standard of medical care.

## 2021-03-01 NOTE — Telephone Encounter (Signed)
ATC patient to review Forteo renewal application. Left detailed VM requesting she bring her Forteo PAP renewal application to office visit on 03/14/21.  She has been advised that she will be unable to receive Forteo next year if this application is not submitted on time  Knox Saliva, PharmD, MPH, BCPS Clinical Pharmacist (Rheumatology and Pulmonology)

## 2021-03-02 ENCOUNTER — Ambulatory Visit: Payer: Medicare Other | Admitting: Rheumatology

## 2021-03-14 ENCOUNTER — Ambulatory Visit: Payer: Medicare Other | Admitting: Rheumatology

## 2021-03-14 ENCOUNTER — Other Ambulatory Visit: Payer: Self-pay

## 2021-03-14 ENCOUNTER — Encounter: Payer: Self-pay | Admitting: Rheumatology

## 2021-03-14 VITALS — BP 98/59 | HR 69 | Ht <= 58 in | Wt 126.4 lb

## 2021-03-14 DIAGNOSIS — S72002A Fracture of unspecified part of neck of left femur, initial encounter for closed fracture: Secondary | ICD-10-CM

## 2021-03-14 DIAGNOSIS — M19041 Primary osteoarthritis, right hand: Secondary | ICD-10-CM

## 2021-03-14 DIAGNOSIS — M4015 Other secondary kyphosis, thoracolumbar region: Secondary | ICD-10-CM

## 2021-03-14 DIAGNOSIS — M8008XS Age-related osteoporosis with current pathological fracture, vertebra(e), sequela: Secondary | ICD-10-CM | POA: Diagnosis not present

## 2021-03-14 DIAGNOSIS — Z8639 Personal history of other endocrine, nutritional and metabolic disease: Secondary | ICD-10-CM

## 2021-03-14 DIAGNOSIS — M8000XS Age-related osteoporosis with current pathological fracture, unspecified site, sequela: Secondary | ICD-10-CM

## 2021-03-14 DIAGNOSIS — N183 Anemia in chronic kidney disease: Secondary | ICD-10-CM

## 2021-03-14 DIAGNOSIS — E1149 Type 2 diabetes mellitus with other diabetic neurological complication: Secondary | ICD-10-CM

## 2021-03-14 DIAGNOSIS — M5136 Other intervertebral disc degeneration, lumbar region: Secondary | ICD-10-CM

## 2021-03-14 DIAGNOSIS — M51369 Other intervertebral disc degeneration, lumbar region without mention of lumbar back pain or lower extremity pain: Secondary | ICD-10-CM

## 2021-03-14 DIAGNOSIS — M1120 Other chondrocalcinosis, unspecified site: Secondary | ICD-10-CM

## 2021-03-14 DIAGNOSIS — E790 Hyperuricemia without signs of inflammatory arthritis and tophaceous disease: Secondary | ICD-10-CM

## 2021-03-14 DIAGNOSIS — M47816 Spondylosis without myelopathy or radiculopathy, lumbar region: Secondary | ICD-10-CM | POA: Diagnosis not present

## 2021-03-14 DIAGNOSIS — I10 Essential (primary) hypertension: Secondary | ICD-10-CM

## 2021-03-14 DIAGNOSIS — L409 Psoriasis, unspecified: Secondary | ICD-10-CM

## 2021-03-14 DIAGNOSIS — E039 Hypothyroidism, unspecified: Secondary | ICD-10-CM

## 2021-03-14 DIAGNOSIS — M170 Bilateral primary osteoarthritis of knee: Secondary | ICD-10-CM

## 2021-03-14 DIAGNOSIS — D631 Anemia in chronic kidney disease: Secondary | ICD-10-CM

## 2021-03-14 DIAGNOSIS — M19042 Primary osteoarthritis, left hand: Secondary | ICD-10-CM

## 2021-03-14 NOTE — Telephone Encounter (Signed)
Patient dropped of Forteo PAP renewal application at [place] today. She did not bring income documents. She has been advised to collect tax return and hospital/medical bills and return to clinic. She will drop everything off tomorrow, 03/15/21 since she has an ophthalmology appointment scheduled tomorrow nearby  [facility] PharmD, MPH, BCPS Clinical Pharmacist (Rheumatology and Pulmonology)

## 2021-03-15 ENCOUNTER — Telehealth: Payer: Self-pay | Admitting: Internal Medicine

## 2021-03-15 ENCOUNTER — Encounter (INDEPENDENT_AMBULATORY_CARE_PROVIDER_SITE_OTHER): Payer: Medicare Other | Admitting: Ophthalmology

## 2021-03-15 ENCOUNTER — Telehealth: Payer: Self-pay | Admitting: *Deleted

## 2021-03-15 DIAGNOSIS — Z794 Long term (current) use of insulin: Secondary | ICD-10-CM

## 2021-03-15 NOTE — Telephone Encounter (Signed)
T, did not find out whether she is sick a more got any steroids?  Also, if her insulin may be old?  If not, if she came off her Jardiance? We may need to use basal insulin for her at least provisionally.

## 2021-03-15 NOTE — Telephone Encounter (Signed)
Patient requests to be called at ph# [phone] re: Patient states she has been having high blood sugars both am and pm over 200. Patient states she 9 units of insulin 2 x per day, however, Patient's blood sugars are still in the 200's.

## 2021-03-15 NOTE — Telephone Encounter (Signed)
Patient states she is having pain in her knees. More pain in the left then right. Patient states she has some swelling and would like to know what she can do for the knee pain. Please advise.

## 2021-03-15 NOTE — Telephone Encounter (Signed)
She may use ice or heat.  Her kidney functions are low.  She should avoid Advil and Aleve.  She may use topical icy hot.

## 2021-03-15 NOTE — Telephone Encounter (Signed)
Patient advised she may use ice or heat.  Her kidney functions are low.  Patient advised she should avoid Advil and Aleve.  She may use topical icy hot. Patient expressed understanding.

## 2021-03-15 NOTE — Telephone Encounter (Signed)
T, Can you please call in Lantus for her: 1 box of 15 ml with 3 refills. She can start with 5 units daily in am and increase by 2 units every 2 days if sugars remain high. Ty! C

## 2021-03-16 MED ORDER — INSULIN GLARGINE 100 UNITS/ML SOLOSTAR PEN: 5.0000 [IU] | PEN_INJECTOR | Freq: Every day | SUBCUTANEOUS | 0 refills | Status: DC

## 2021-03-16 NOTE — Telephone Encounter (Signed)
Rx faxed to preferred pharmacy. Pt advised of instructions and advised to contact office if sugars remain high.

## 2021-03-26 ENCOUNTER — Other Ambulatory Visit: Payer: Self-pay | Admitting: Internal Medicine

## 2021-03-28 ENCOUNTER — Telehealth: Payer: Self-pay | Admitting: Rheumatology

## 2021-03-28 NOTE — Telephone Encounter (Signed)
Submitted Patient Assistance RENEWAL Application to [facility] for FORTEO along with provider portion, patient portion, PA and income documents. Will update patient when we receive a response.  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

## 2021-03-28 NOTE — Telephone Encounter (Signed)
Patient left a voicemail stating she has submitted all her necessary paperwork for an injection she is supposed to be getting and would like a call back from [facility] on wether or not she can get it. [phone]

## 2021-03-29 NOTE — Telephone Encounter (Signed)
Returned call to patient and advised we have not yet heard from [employer] P[employer] for her Forteo renewal.  She was provided with [employer] phone number to connect for updates.  Knox Saliva, PharmD, MPH, BCPS Clinical Pharmacist (Rheumatology and Pulmonology)

## 2021-04-04 DIAGNOSIS — N390 Urinary tract infection, site not specified: Secondary | ICD-10-CM | POA: Diagnosis not present

## 2021-04-04 NOTE — Telephone Encounter (Signed)
Received call from patient - she states that she called [employer] and they had not received her application for Forteo. She states that she has completed her supply. I advised that we received confirmation of receipt of fax - hold time for [employer] PAP is > 30 min. To be safe, will refax entire PAP application for [employer]  Knox Saliva, PharmD, MPH, BCPS Clinical Pharmacist (Rheumatology and Pulmonology)

## 2021-04-09 ENCOUNTER — Other Ambulatory Visit: Payer: Self-pay

## 2021-04-09 ENCOUNTER — Encounter (INDEPENDENT_AMBULATORY_CARE_PROVIDER_SITE_OTHER): Payer: Medicare Other | Admitting: Ophthalmology

## 2021-04-09 DIAGNOSIS — E113393 Type 2 diabetes mellitus with moderate nonproliferative diabetic retinopathy without macular edema, bilateral: Secondary | ICD-10-CM

## 2021-04-09 DIAGNOSIS — H2703 Aphakia, bilateral: Secondary | ICD-10-CM

## 2021-04-09 DIAGNOSIS — I10 Essential (primary) hypertension: Secondary | ICD-10-CM

## 2021-04-09 DIAGNOSIS — H43813 Vitreous degeneration, bilateral: Secondary | ICD-10-CM | POA: Diagnosis not present

## 2021-04-09 DIAGNOSIS — H35033 Hypertensive retinopathy, bilateral: Secondary | ICD-10-CM | POA: Diagnosis not present

## 2021-04-13 ENCOUNTER — Other Ambulatory Visit: Payer: Self-pay | Admitting: Rheumatology

## 2021-04-13 NOTE — Telephone Encounter (Signed)
Next Visit: 09/12/2021  Last Visit: 03/14/2021  DX: Age-related osteoporosis with current pathological fracture, sequela  Current Dose per office note 03/14/2021: started on Forteo on September 29, 2019.  Labs: 02/27/2021 Hgb 10.3, Hct 32.5  Okay to refill Forteo?

## 2021-04-17 ENCOUNTER — Telehealth: Payer: Self-pay | Admitting: Rheumatology

## 2021-04-17 NOTE — Telephone Encounter (Signed)
Patient left a voicemail requesting a return call from [facility].

## 2021-04-17 NOTE — Telephone Encounter (Signed)
Returned call to the patient. She states she has not received her Forteo. Advised patient we sent it in on 04/13/2021. Patient advised I would call tomorrow morning and follow up on prescription.

## 2021-04-19 NOTE — Telephone Encounter (Signed)
Called [employer] for update on Forteo PAP renewal application status. Per rep, application was received with appeal and is being processed. Per rep, there is no turnaround time.  ATC patient to review - left detailed VM advising of the above.  Phone: [phone]  Knox Saliva, PharmD, MPH, BCPS Clinical Pharmacist (Rheumatology and Pulmonology)

## 2021-04-19 NOTE — Telephone Encounter (Signed)
Spoke with the pharmacy and they state they do not have the active 2023 patient assistance approval. They will be unable to send out the prescription until they have that. Prescription will need to be resent once they have this information.

## 2021-04-19 NOTE — Telephone Encounter (Signed)
Called [employer] for update on Forteo PAP renewal application status. Per rep, application was received with appeal and is being processed. Per rep, there is no turnaround time.  ATC patient to review - left detailed VM advising of the above.  Phone: [phone]  Knox Saliva, PharmD, MPH, BCPS Clinical Pharmacist (Rheumatology and Pulmonology)

## 2021-04-23 ENCOUNTER — Other Ambulatory Visit: Payer: Self-pay | Admitting: Internal Medicine

## 2021-04-24 DIAGNOSIS — I25118 Atherosclerotic heart disease of native coronary artery with other forms of angina pectoris: Secondary | ICD-10-CM | POA: Diagnosis not present

## 2021-04-24 DIAGNOSIS — E039 Hypothyroidism, unspecified: Secondary | ICD-10-CM | POA: Diagnosis not present

## 2021-04-24 DIAGNOSIS — E1169 Type 2 diabetes mellitus with other specified complication: Secondary | ICD-10-CM | POA: Diagnosis not present

## 2021-04-24 DIAGNOSIS — I10 Essential (primary) hypertension: Secondary | ICD-10-CM | POA: Diagnosis not present

## 2021-04-25 ENCOUNTER — Other Ambulatory Visit: Payer: Self-pay | Admitting: Physician Assistant

## 2021-04-25 DIAGNOSIS — M65322 Trigger finger, left index finger: Secondary | ICD-10-CM | POA: Diagnosis not present

## 2021-04-25 DIAGNOSIS — M65341 Trigger finger, right ring finger: Secondary | ICD-10-CM | POA: Diagnosis not present

## 2021-04-25 DIAGNOSIS — M79642 Pain in left hand: Secondary | ICD-10-CM | POA: Diagnosis not present

## 2021-04-25 DIAGNOSIS — M65332 Trigger finger, left middle finger: Secondary | ICD-10-CM | POA: Diagnosis not present

## 2021-04-26 NOTE — Telephone Encounter (Signed)
Next Visit: 09/12/2021   Last Visit: 03/14/2021   DX: Chondrocalcinosis    Current Dose per office note 03/14/2021: colchicine 0.3mg  by mouth daily   Labs: 02/27/2021 Hgb 10.3, Hct 32.5  Okay to refill Colchicine?

## 2021-04-27 NOTE — Telephone Encounter (Signed)
Called [employer] for update on patient's Forteo renewal application. Rep actually talked to patient yesterday and advised of the following. Rep sent email to management regarding application.  Patient's Forteo PAP appeal has moved forward one step to the exception request team. Team has not yet made decision on application.  Rep again sent email to management regarding patient's application  Phone: [phone]  Knox Saliva, PharmD, MPH, BCPS Clinical Pharmacist (Rheumatology and Pulmonology)

## 2021-04-30 ENCOUNTER — Ambulatory Visit: Payer: Medicare Other | Admitting: Podiatry

## 2021-05-04 ENCOUNTER — Other Ambulatory Visit: Payer: Self-pay

## 2021-05-04 DIAGNOSIS — E1122 Type 2 diabetes mellitus with diabetic chronic kidney disease: Secondary | ICD-10-CM

## 2021-05-04 DIAGNOSIS — N184 Chronic kidney disease, stage 4 (severe): Secondary | ICD-10-CM

## 2021-05-04 MED ORDER — ONETOUCH VERIO VI STRP: ORAL_STRIP | 2 refills | Status: AC

## 2021-05-04 NOTE — Telephone Encounter (Signed)
Pt left vm requesting a refill for test strips. New rx sent to pharmacy.

## 2021-05-07 ENCOUNTER — Other Ambulatory Visit (HOSPITAL_COMMUNITY): Payer: Self-pay

## 2021-05-07 NOTE — Telephone Encounter (Signed)
Received fax from [facility] patient assistance, patient's application has been DENIED due to exceeding financial criteria. We had submitted appeal request with application and appeal request has also been denied. Original denial is upheld . Per [employer] rep, patient submitted 2020 tax return and can submit 2021 tax return for reconsideration   Phone# [phone]  Per test claim, copay for first 28 days is $1089.74.  No osteoporosis grants currently open to enroll patient.  Knox Saliva, PharmD, MPH, BCPS Clinical Pharmacist (Rheumatology and Pulmonology)

## 2021-05-08 ENCOUNTER — Encounter: Payer: Self-pay | Admitting: Hematology and Oncology

## 2021-05-08 ENCOUNTER — Other Ambulatory Visit (HOSPITAL_COMMUNITY): Payer: Self-pay

## 2021-05-08 NOTE — Telephone Encounter (Signed)
Called patient to notify of Forteo denial. She will have to switch to Prolia.  I briefly reviewed that ideally we keep her on Forteo for 2 years but we are unable to do so if it is unaffordable. I advised that we don't have clinical data indicating benefits of 1.5 years of therapy.  Patient will call back once she knows her availability for pharmacy clinic visit appointment to avoid provider charge.  Knox Saliva, PharmD, MPH, BCPS Clinical Pharmacist (Rheumatology and Pulmonology)

## 2021-05-14 ENCOUNTER — Telehealth: Payer: Self-pay

## 2021-05-14 DIAGNOSIS — M8000XS Age-related osteoporosis with current pathological fracture, unspecified site, sequela: Secondary | ICD-10-CM

## 2021-05-14 NOTE — Telephone Encounter (Signed)
Patient left a voicemail requesting the pharmacist return her call.

## 2021-05-14 NOTE — Telephone Encounter (Signed)
Returned call to patient to schedule visit to discuss Prolia - unable to reach. Left VM requesting return call in the morning  Knox Saliva, PharmD, MPH, BCPS Clinical Pharmacist (Rheumatology and Pulmonology)

## 2021-05-15 NOTE — Telephone Encounter (Signed)
Patient scheduled for visit with pharmacy team tomorrow, 05/16/21 to discuss transitioning from Forteo to [place], PharmD, MPH, BCPS Clinical Pharmacist (Rheumatology and Pulmonology)

## 2021-05-16 ENCOUNTER — Other Ambulatory Visit (HOSPITAL_COMMUNITY): Payer: Self-pay

## 2021-05-16 ENCOUNTER — Other Ambulatory Visit: Payer: Self-pay

## 2021-05-16 ENCOUNTER — Ambulatory Visit: Payer: Medicare Other | Admitting: Pharmacist

## 2021-05-16 DIAGNOSIS — Z8781 Personal history of (healed) traumatic fracture: Secondary | ICD-10-CM

## 2021-05-16 DIAGNOSIS — S72002A Fracture of unspecified part of neck of left femur, initial encounter for closed fracture: Secondary | ICD-10-CM | POA: Diagnosis not present

## 2021-05-16 DIAGNOSIS — M810 Age-related osteoporosis without current pathological fracture: Secondary | ICD-10-CM

## 2021-05-16 DIAGNOSIS — Z79899 Other long term (current) drug therapy: Secondary | ICD-10-CM

## 2021-05-16 DIAGNOSIS — N183 Chronic kidney disease, stage 3 unspecified: Secondary | ICD-10-CM | POA: Diagnosis not present

## 2021-05-16 DIAGNOSIS — M5136 Other intervertebral disc degeneration, lumbar region: Secondary | ICD-10-CM | POA: Diagnosis not present

## 2021-05-16 DIAGNOSIS — M8000XS Age-related osteoporosis with current pathological fracture, unspecified site, sequela: Secondary | ICD-10-CM

## 2021-05-16 LAB — CBC WITH DIFFERENTIAL/PLATELET
Absolute Monocytes: 370 cells/uL (ref 200–950)
Basophils Absolute: 28 cells/uL (ref 0–200)
Basophils Relative: 0.5 %
Eosinophils Absolute: 308 cells/uL (ref 15–500)
Eosinophils Relative: 5.5 %
HCT: 31.7 % — ABNORMAL LOW (ref 35.0–45.0)
Hemoglobin: 9.7 g/dL — ABNORMAL LOW (ref 11.7–15.5)
Lymphs Abs: 1618 cells/uL (ref 850–3900)
MCH: 26.9 pg — ABNORMAL LOW (ref 27.0–33.0)
MCHC: 30.6 g/dL — ABNORMAL LOW (ref 32.0–36.0)
MCV: 88.1 fL (ref 80.0–100.0)
MPV: 12.3 fL (ref 7.5–12.5)
Monocytes Relative: 6.6 %
Neutro Abs: 3276 cells/uL (ref 1500–7800)
Neutrophils Relative %: 58.5 %
Platelets: 171 10*3/uL (ref 140–400)
RBC: 3.6 10*6/uL — ABNORMAL LOW (ref 3.80–5.10)
RDW: 13.1 % (ref 11.0–15.0)
Total Lymphocyte: 28.9 %
WBC: 5.6 10*3/uL (ref 3.8–10.8)

## 2021-05-16 LAB — COMPLETE METABOLIC PANEL WITH GFR
AG Ratio: 2 (calc) (ref 1.0–2.5)
ALT: 14 U/L (ref 6–29)
AST: 18 U/L (ref 10–35)
Albumin: 3.8 g/dL (ref 3.6–5.1)
Alkaline phosphatase (APISO): 66 U/L (ref 37–153)
BUN/Creatinine Ratio: 21 (calc) (ref 6–22)
BUN: 44 mg/dL — ABNORMAL HIGH (ref 7–25)
CO2: 30 mmol/L (ref 20–32)
Calcium: 9.8 mg/dL (ref 8.6–10.4)
Chloride: 103 mmol/L (ref 98–110)
Creat: 2.06 mg/dL — ABNORMAL HIGH (ref 0.60–1.00)
Globulin: 1.9 g/dL (calc) (ref 1.9–3.7)
Glucose, Bld: 171 mg/dL — ABNORMAL HIGH (ref 65–99)
Potassium: 4.1 mmol/L (ref 3.5–5.3)
Sodium: 141 mmol/L (ref 135–146)
Total Bilirubin: 0.5 mg/dL (ref 0.2–1.2)
Total Protein: 5.7 g/dL — ABNORMAL LOW (ref 6.1–8.1)
eGFR: 24 mL/min/{1.73_m2} — ABNORMAL LOW (ref >=60)

## 2021-05-16 LAB — VITAMIN D 25 HYDROXY (VIT D DEFICIENCY, FRACTURES): Vit D, 25-Hydroxy: 34 ng/mL (ref 30–100)

## 2021-05-16 NOTE — Telephone Encounter (Signed)
Patient had labs drawn today.  Submitted a Prior Authorization request to [facility] for PROLIA via CoverMyMeds. Will update once we receive a response.  Key: [place] provider services for [facility] for benefits and eligibility for Prolia through medical benefit (V0131). Plan is effective 03/25/21. Member must use in-network providers. [facility] is in-network  No deductible. There is $3960 out of pocket maximum. As of 05/16/21, patient has accumulated $70. Prolia does not require prior authorization. Medicare will cover 80% of cost of medication. Patient is responsible for 20% coinsurance.  Call reference #: [id number] Provider services Phone: [phone]  Knox Saliva, PharmD, MPH, BCPS Clinical Pharmacist (Rheumatology and Pulmonology)

## 2021-05-16 NOTE — Telephone Encounter (Signed)
Received notification from [facility] regarding a prior authorization for Meno. Authorization has been APPROVED from 05/16/21 to 05/16/22.   Per test claim, copay for 180 days supply is $270  Patient can fill through [facility]: [phone]   Authorization # [id number]  Knox Saliva, PharmD, MPH, BCPS Clinical Pharmacist (Rheumatology and Pulmonology)

## 2021-05-16 NOTE — Progress Notes (Addendum)
Pharmacy Note  Subjective:  Patient presents today to [facility] for pharmacy visit to discuss next steps for osteoporosis treatment.   Patient started Forteo on 09/29/19 with expected completion on 09/28/21. Patient has been receiving Forteo through grant then transitioned to patient assistance. Patient had significant increase in her household income which led to ineligibility for cost assistance this year. Patient has therefore completed 1.5 years of Forteo therapy.  Objective: CMP     Component Value Date/Time   NA 138 09/20/2019 1506   NA 138 12/12/2016 1150   K 4.4 09/20/2019 1506   K 4.6 12/12/2016 1150   CL 100 09/20/2019 1506   CL 103 07/27/2012 1259   CO2 29 09/20/2019 1506   CO2 31 (H) 12/12/2016 1150   GLUCOSE 98 09/20/2019 1506   GLUCOSE 149 (H) 12/12/2016 1150   GLUCOSE 118 (H) 07/27/2012 1259   BUN 32 (H) 09/20/2019 1506   BUN 23.4 12/12/2016 1150   CREATININE 2.18 (H) 09/20/2019 1506   CREATININE 1.7 (H) 12/12/2016 1150   CALCIUM 9.9 09/20/2019 1506   CALCIUM 11.4 (H) 12/12/2016 1150   PROT 6.0 (L) 09/20/2019 1506   PROT 6.0 (L) 09/20/2019 1506   PROT 6.5 12/12/2016 1150   ALBUMIN 4.0 04/29/2019 1451   ALBUMIN 3.9 12/12/2016 1150   AST 16 09/20/2019 1506   AST 19 12/12/2016 1150   ALT 6 09/20/2019 1506   ALT 10 12/12/2016 1150   ALKPHOS 68 04/29/2019 1451   ALKPHOS 65 12/12/2016 1150   BILITOT 0.5 09/20/2019 1506   BILITOT 0.36 12/12/2016 1150   GFRNONAA 21 (L) 09/20/2019 1506   GFRAA 25 (L) 09/20/2019 1506   Vitamin D Lab Results  Component Value Date   VD25OH 70 09/20/2019   DEXA -  09/22/2019 Lowest T-score and site measured: -2.2 Right Femoral Neck  Assessment/Plan:   Patient is unable to complete two years of Forteo therapy due to access issues. She has history of vertebral fracture. She has history of displaced fracture of left femoral neck.  Due to her CKD, she is ineligible for bisphosphonate therapy.  Counseled patient on  purpose, proper use, and adverse effects of Prolia.  Counseled patient that Prolia is a medication that must be injected every 6 months by a healthcare professional.  Advised patient to take calcium 1200 mg daily and vitamin D 800 units daily.  Due to CKD, she does not take calcium supplements. Reviewed the most common adverse effects of Prolia including risk of infection, osteonecrosis of the jaw, rash, and muscle/bone pain.  Patient confirms she does not have any major dental work planned at this time.  Reviewed with patient the signs/symptoms of low calcium and advised patient to alert [place] if she experiences these symptoms.  Provided patient with medication education material and answered all questions. Will apply for Prolia through patient's insurance.  Orders Placed This Encounter  Procedures   CBC with Differential/Platelet   COMPLETE METABOLIC PANEL WITH GFR   VITAMIN D 25 Hydroxy (Vit-D Deficiency, Fractures)   Knox Saliva, PharmD, MPH, BCPS Clinical Pharmacist (Rheumatology and Pulmonology)

## 2021-05-17 ENCOUNTER — Telehealth: Payer: Self-pay

## 2021-05-17 NOTE — Telephone Encounter (Signed)
She has not needed injection for a very long time, 6 months should be adequate, unless her CBC with PCP is low we can see her sooner

## 2021-05-17 NOTE — Telephone Encounter (Signed)
Called back and scheduled appts on 3/10 for lab, MD and injection. She is aware of appt date/time.

## 2021-05-17 NOTE — Progress Notes (Signed)
Patient is anemic.  Glucose is elevated at 171.  Creatinine is elevated at 2.06.  Anemia most likely due to kidney disease.  Please forward results to her PCP and nephrologist.  Vitamin D is normal.

## 2021-05-17 NOTE — Telephone Encounter (Signed)
She called and left a message. Her next appt is 6/6. She feels that is too long. She is requesting a earlier appt for blood work and injection.

## 2021-05-18 ENCOUNTER — Other Ambulatory Visit (HOSPITAL_COMMUNITY): Payer: Self-pay

## 2021-05-18 MED ORDER — DENOSUMAB 60 MG/ML ~~LOC~~ SOSY
60.0000 mg | PREFILLED_SYRINGE | Freq: Once | SUBCUTANEOUS | 0 refills | Status: DC
  Filled 2021-05-18: qty 1, 1d supply, fill #0
  Filled 2021-05-21: qty 1, 180d supply, fill #0

## 2021-05-18 NOTE — Telephone Encounter (Signed)
Called patient to review Prolia findings. She is comfortable with paying $270 for injection. She has been advised that [facility] will call to collect copay before they can ship the medication. She verbalized understanding.  Rx sent to [facility] with note to courier by 05/24/21.  Patient to coordinate with her nephew for transportation to clinic on Thursday, 05/24/21 or after  Knox Saliva, PharmD, MPH, BCPS Clinical Pharmacist (Rheumatology and Pulmonology)

## 2021-05-18 NOTE — Addendum Note (Signed)
Addended by: Cassandria Anger on: 05/18/2021 09:43 AM   Modules accepted: Orders

## 2021-05-21 ENCOUNTER — Other Ambulatory Visit (HOSPITAL_COMMUNITY): Payer: Self-pay

## 2021-05-21 ENCOUNTER — Telehealth: Payer: Self-pay | Admitting: Rheumatology

## 2021-05-21 DIAGNOSIS — N1832 Chronic kidney disease, stage 3b: Secondary | ICD-10-CM | POA: Diagnosis not present

## 2021-05-21 NOTE — Telephone Encounter (Signed)
ATC. LVM. Direct number provided

## 2021-05-21 NOTE — Telephone Encounter (Signed)
Routing for f/u.

## 2021-05-21 NOTE — Telephone Encounter (Signed)
Patient called the office confirming her next appointment. Patient states she is supposed to come in this week for an injection with [facility] and requests a call back.

## 2021-05-22 ENCOUNTER — Other Ambulatory Visit: Payer: Self-pay | Admitting: Gastroenterology

## 2021-05-22 ENCOUNTER — Other Ambulatory Visit (HOSPITAL_COMMUNITY): Payer: Self-pay

## 2021-05-22 DIAGNOSIS — I10 Essential (primary) hypertension: Secondary | ICD-10-CM | POA: Diagnosis not present

## 2021-05-22 DIAGNOSIS — R1310 Dysphagia, unspecified: Secondary | ICD-10-CM

## 2021-05-22 DIAGNOSIS — Z86010 Personal history of colonic polyps: Secondary | ICD-10-CM | POA: Diagnosis not present

## 2021-05-22 DIAGNOSIS — D638 Anemia in other chronic diseases classified elsewhere: Secondary | ICD-10-CM | POA: Diagnosis not present

## 2021-05-22 DIAGNOSIS — K219 Gastro-esophageal reflux disease without esophagitis: Secondary | ICD-10-CM | POA: Diagnosis not present

## 2021-05-22 NOTE — Telephone Encounter (Signed)
Patient returned call and stated she feels comfortable providing CC information to me. Information collected and provided to [place] at [facility].  Knox Saliva, PharmD, MPH, BCPS Clinical Pharmacist (Rheumatology and Pulmonology)

## 2021-05-22 NOTE — Telephone Encounter (Signed)
Returned call to patient regarding Prolia. She states she will discuss with her nephew about when she is available next week to come into office for Prolia. Her husband did not feel comfortable with patient providing payment information. I advised that she will have to go to [facility] to pay copay before they can ship to the clinic. Urged her to go this week to complete payment and they will not provide medication to her. They will have to ship to the clinic.  Knox Saliva, PharmD, MPH, BCPS Clinical Pharmacist (Rheumatology and Pulmonology)

## 2021-05-22 NOTE — Telephone Encounter (Addendum)
Prolia payment information provided to [place], CPhT, at [facility]. Patient to call back to schedule appt. Will route to [facility] to help set up shipment to clinic before next week  Knox Saliva, PharmD, MPH, BCPS Clinical Pharmacist (Rheumatology and Pulmonology)

## 2021-05-23 ENCOUNTER — Other Ambulatory Visit (HOSPITAL_COMMUNITY): Payer: Self-pay

## 2021-05-24 DIAGNOSIS — I129 Hypertensive chronic kidney disease with stage 1 through stage 4 chronic kidney disease, or unspecified chronic kidney disease: Secondary | ICD-10-CM | POA: Diagnosis not present

## 2021-05-24 DIAGNOSIS — N2581 Secondary hyperparathyroidism of renal origin: Secondary | ICD-10-CM | POA: Diagnosis not present

## 2021-05-24 DIAGNOSIS — N184 Chronic kidney disease, stage 4 (severe): Secondary | ICD-10-CM | POA: Diagnosis not present

## 2021-05-24 DIAGNOSIS — D631 Anemia in chronic kidney disease: Secondary | ICD-10-CM | POA: Diagnosis not present

## 2021-05-24 NOTE — Telephone Encounter (Signed)
Prolia received. Placed in refrigerator ?

## 2021-05-25 ENCOUNTER — Ambulatory Visit: Payer: Medicare Other | Admitting: Podiatry

## 2021-05-29 ENCOUNTER — Other Ambulatory Visit: Payer: Self-pay | Admitting: Internal Medicine

## 2021-05-29 DIAGNOSIS — E039 Hypothyroidism, unspecified: Secondary | ICD-10-CM

## 2021-05-30 ENCOUNTER — Telehealth: Payer: Self-pay | Admitting: Hematology and Oncology

## 2021-05-30 NOTE — Telephone Encounter (Signed)
Canceled appointment per patient request. Left message with confirmation of appointment cancellation and let patient know to call back to reschedule.  ?

## 2021-05-30 NOTE — Telephone Encounter (Signed)
Called patient to schedule Prolia appt. Unable to reach - phone went straight to VM. Left VM requesting return call to schedule Prolia appt ? ?Knox Saliva, PharmD, MPH, BCPS ?Clinical Pharmacist (Rheumatology and Pulmonology) ?

## 2021-05-31 NOTE — Telephone Encounter (Signed)
Patient called stating she has a virus and will call to schedule Prolia appt once she is feeling better ? ?Knox Saliva, PharmD, MPH, BCPS ?Clinical Pharmacist (Rheumatology and Pulmonology) ?

## 2021-06-01 ENCOUNTER — Other Ambulatory Visit: Payer: Medicare Other

## 2021-06-01 ENCOUNTER — Ambulatory Visit: Payer: Medicare Other

## 2021-06-01 ENCOUNTER — Ambulatory Visit: Payer: Medicare Other | Admitting: Hematology and Oncology

## 2021-06-26 ENCOUNTER — Other Ambulatory Visit: Payer: Self-pay | Admitting: Internal Medicine

## 2021-06-26 DIAGNOSIS — E039 Hypothyroidism, unspecified: Secondary | ICD-10-CM

## 2021-06-26 NOTE — Telephone Encounter (Signed)
Needs appt within the next 6 mo for further refills ?

## 2021-07-02 NOTE — Telephone Encounter (Signed)
Patient left VM stating she is ready to start on Prolia. Patient has last CMP on 05/16/21. Patient will start Prolia on 07/05/21. Patient will need CMET rechecked for hypocalcemia monitoring within 2 weeks due to history of CKD. ? ?Knox Saliva, PharmD, MPH, BCPS ?Clinical Pharmacist (Rheumatology and Pulmonology) ?

## 2021-07-04 ENCOUNTER — Ambulatory Visit: Payer: Medicare Other | Admitting: Podiatry

## 2021-07-04 NOTE — Progress Notes (Signed)
Pharmacy Note ? ?Subjective:   ?Patient presents to clinic today to start Prolia. She completed < 2 years of Forteo therapy (completed about 18 months of therapy) - she was unable to afford medication and did not qualify for patient assistance despite appeal for pt assistance program. She was supposed to transition to [place] in February however patient and her husband were diagnosed with [place]. Her husband sadly passed away due to [id number] complications a few weeks ago - she is teary eyed speaking of him. ? ?She has history of vertebral fracture, left femoral neck fracture. ? ?Patient running a fever or have signs/symptoms of infection? No ? ?Patient currently on antibiotics for the treatment of infection? No ? ?Patient had fall in the last 6 months?  No   ?Patient taking calcium 1200 mg daily through diet or supplement and at least 800 units vitamin D? Yes ? ?Patient unable to take calcium supplement due to CKD ? ?Objective: ?CMP  ?   ?Component Value Date/Time  ? NA 141 05/16/2021 1027  ? NA 138 12/12/2016 1150  ? K 4.1 05/16/2021 1027  ? K 4.6 12/12/2016 1150  ? CL 103 05/16/2021 1027  ? CL 103 07/27/2012 1259  ? CO2 30 05/16/2021 1027  ? CO2 31 (H) 12/12/2016 1150  ? GLUCOSE 171 (H) 05/16/2021 1027  ? GLUCOSE 149 (H) 12/12/2016 1150  ? GLUCOSE 118 (H) 07/27/2012 1259  ? BUN 44 (H) 05/16/2021 1027  ? BUN 23.4 12/12/2016 1150  ? CREATININE 2.06 (H) 05/16/2021 1027  ? CREATININE 1.7 (H) 12/12/2016 1150  ? CALCIUM 9.8 05/16/2021 1027  ? CALCIUM 11.4 (H) 12/12/2016 1150  ? PROT 5.7 (L) 05/16/2021 1027  ? PROT 6.5 12/12/2016 1150  ? ALBUMIN 4.0 04/29/2019 1451  ? ALBUMIN 3.9 12/12/2016 1150  ? AST 18 05/16/2021 1027  ? AST 19 12/12/2016 1150  ? ALT 14 05/16/2021 1027  ? ALT 10 12/12/2016 1150  ? ALKPHOS 68 04/29/2019 1451  ? ALKPHOS 65 12/12/2016 1150  ? BILITOT 0.5 05/16/2021 1027  ? BILITOT 0.36 12/12/2016 1150  ? GFRNONAA 21 (L) 09/20/2019 1506  ? GFRAA 25 (L) 09/20/2019 1506  ? ? ?CBC ?   ?Component Value  Date/Time  ? WBC 5.6 05/16/2021 1027  ? RBC 3.60 (L) 05/16/2021 1027  ? HGB 9.7 (L) 05/16/2021 1027  ? HGB 10.1 (L) 02/05/2018 1123  ? HGB 10.9 (L) 03/21/2017 1221  ? HCT 31.7 (L) 05/16/2021 1027  ? HCT 31.9 (L) 01/05/2019 1340  ? HCT 35.9 03/21/2017 1221  ? PLT 171 05/16/2021 1027  ? PLT 200 02/05/2018 1123  ? PLT 288 03/21/2017 1221  ? MCV 88.1 05/16/2021 1027  ? MCV 82.5 03/21/2017 1221  ? MCH 26.9 (L) 05/16/2021 1027  ? MCHC 30.6 (L) 05/16/2021 1027  ? RDW 13.1 05/16/2021 1027  ? RDW 15.7 (H) 03/21/2017 1221  ? LYMPHSABS 1,618 05/16/2021 1027  ? LYMPHSABS 1.1 03/21/2017 1221  ? MONOABS 0.5 02/27/2021 0804  ? MONOABS 0.3 03/21/2017 1221  ? EOSABS 308 05/16/2021 1027  ? EOSABS 0.3 03/21/2017 1221  ? BASOSABS 28 05/16/2021 1027  ? BASOSABS 0.0 03/21/2017 1221  ? ? ?Lab Results  ?Component Value Date  ? VD25OH 34 05/16/2021  ? ? ?T-score:  ?Date of Scan: 09/22/2019 Lowest T-score and site measured: -2.2 Right Femoral Neck. started on Forteo on September 29, 2019 ? ?Assessment/Plan:  ? ?Patient tolerated injection without issue.  ? ?Administrations This Visit   ? ? denosumab (PROLIA) injection 60 mg   ? ?  Admin Date ?07/05/2021 Action ?Given Dose ?60 mg Route ?Subcutaneous Administered By ?Cassandria Anger, RPH  ? ?  ?  ? ?  ? ? ?Patient is to return in 10-14 days for labs to monitor for hypocalcemia due to history of CKD.  Future orders placed. ?  ?All questions encouraged and answered.  Instructed patient to call with any further questions or concerns. ? ?Knox Saliva, PharmD, MPH, BCPS ?Clinical Pharmacist (Rheumatology and Pulmonology) ?

## 2021-07-05 ENCOUNTER — Ambulatory Visit (INDEPENDENT_AMBULATORY_CARE_PROVIDER_SITE_OTHER): Payer: Medicare Other | Admitting: Pharmacist

## 2021-07-05 VITALS — BP 120/74 | HR 67

## 2021-07-05 DIAGNOSIS — Z79899 Other long term (current) drug therapy: Secondary | ICD-10-CM | POA: Diagnosis not present

## 2021-07-05 DIAGNOSIS — Z7689 Persons encountering health services in other specified circumstances: Secondary | ICD-10-CM

## 2021-07-05 DIAGNOSIS — Z8781 Personal history of (healed) traumatic fracture: Secondary | ICD-10-CM

## 2021-07-05 DIAGNOSIS — M8000XS Age-related osteoporosis with current pathological fracture, unspecified site, sequela: Secondary | ICD-10-CM

## 2021-07-05 MED ORDER — DENOSUMAB 60 MG/ML ~~LOC~~ SOSY
60.0000 mg | PREFILLED_SYRINGE | Freq: Once | SUBCUTANEOUS | Status: AC
  Administered 2021-07-05: 60 mg via SUBCUTANEOUS
  Filled 2021-07-05: qty 1

## 2021-07-05 NOTE — Patient Instructions (Addendum)
Come between 4/24 and 4/27 to re-check calcium level ? ?Lab hours are from Monday to Thursday 9am - 4:30pm and Friday 9am -4pm. You do not need an appointment if you come for labs during these times. ?

## 2021-07-08 ENCOUNTER — Telehealth: Payer: Self-pay | Admitting: Rheumatology

## 2021-07-08 DIAGNOSIS — G8929 Other chronic pain: Secondary | ICD-10-CM

## 2021-07-08 NOTE — Telephone Encounter (Signed)
I received a phone call from the patient.  She has been experiencing increased pain and discomfort in her lower back.  She lost her husband recently and has no transportation.  She has not seen her PCP in a long time.  She requests home health physical therapy for lower back pain and lower extremity muscle strengthening.  Please arrange for home health PT. ?Thank you ?Bo Merino, MD  ?

## 2021-07-09 NOTE — Telephone Encounter (Signed)
Physical Therapy referral placed.

## 2021-07-11 ENCOUNTER — Ambulatory Visit: Payer: Medicare Other | Admitting: Podiatry

## 2021-07-11 ENCOUNTER — Encounter: Payer: Self-pay | Admitting: Podiatry

## 2021-07-11 DIAGNOSIS — B351 Tinea unguium: Secondary | ICD-10-CM | POA: Diagnosis not present

## 2021-07-11 DIAGNOSIS — M79674 Pain in right toe(s): Secondary | ICD-10-CM

## 2021-07-11 DIAGNOSIS — E1142 Type 2 diabetes mellitus with diabetic polyneuropathy: Secondary | ICD-10-CM | POA: Diagnosis not present

## 2021-07-11 DIAGNOSIS — M79675 Pain in left toe(s): Secondary | ICD-10-CM

## 2021-07-17 ENCOUNTER — Telehealth: Payer: Self-pay | Admitting: Pharmacist

## 2021-07-17 NOTE — Progress Notes (Signed)
Patient will stop by on 07/18/21 morning to have CMP redrawn to monitor for hypocalcemia after starting Prolia on 07/05/21 ? ?Knox Saliva, PharmD, MPH, BCPS ?Clinical Pharmacist (Rheumatology and Pulmonology) ?

## 2021-07-17 NOTE — Telephone Encounter (Signed)
Called patient to remind her to stop by clinic to have CMET redrawn after starting Prolia. She received her first dose of Prolia on 07/05/21. She has CKD - risk of hypocalcemia. ? ?She states she will stop by clinic tomorrow morning to have CMET drawn ? ?Knox Saliva, PharmD, MPH, BCPS ?Clinical Pharmacist (Rheumatology and Pulmonology) ?

## 2021-07-18 ENCOUNTER — Other Ambulatory Visit: Payer: Self-pay | Admitting: *Deleted

## 2021-07-18 DIAGNOSIS — N183 Chronic kidney disease, stage 3 unspecified: Secondary | ICD-10-CM | POA: Diagnosis not present

## 2021-07-18 DIAGNOSIS — E039 Hypothyroidism, unspecified: Secondary | ICD-10-CM | POA: Diagnosis not present

## 2021-07-18 DIAGNOSIS — Z79899 Other long term (current) drug therapy: Secondary | ICD-10-CM

## 2021-07-18 DIAGNOSIS — M8000XS Age-related osteoporosis with current pathological fracture, unspecified site, sequela: Secondary | ICD-10-CM

## 2021-07-18 DIAGNOSIS — E1149 Type 2 diabetes mellitus with other diabetic neurological complication: Secondary | ICD-10-CM | POA: Diagnosis not present

## 2021-07-18 DIAGNOSIS — M1120 Other chondrocalcinosis, unspecified site: Secondary | ICD-10-CM | POA: Diagnosis not present

## 2021-07-19 LAB — COMPREHENSIVE METABOLIC PANEL
AG Ratio: 1.9 (calc) (ref 1.0–2.5)
ALT: 14 U/L (ref 6–29)
AST: 20 U/L (ref 10–35)
Albumin: 3.9 g/dL (ref 3.6–5.1)
Alkaline phosphatase (APISO): 69 U/L (ref 37–153)
BUN/Creatinine Ratio: 21 (calc) (ref 6–22)
BUN: 42 mg/dL — ABNORMAL HIGH (ref 7–25)
CO2: 33 mmol/L — ABNORMAL HIGH (ref 20–32)
Calcium: 9.2 mg/dL (ref 8.6–10.4)
Chloride: 97 mmol/L — ABNORMAL LOW (ref 98–110)
Creat: 1.99 mg/dL — ABNORMAL HIGH (ref 0.60–1.00)
Globulin: 2.1 g/dL (calc) (ref 1.9–3.7)
Glucose, Bld: 251 mg/dL — ABNORMAL HIGH (ref 65–99)
Potassium: 4.4 mmol/L (ref 3.5–5.3)
Sodium: 138 mmol/L (ref 135–146)
Total Bilirubin: 0.5 mg/dL (ref 0.2–1.2)
Total Protein: 6 g/dL — ABNORMAL LOW (ref 6.1–8.1)

## 2021-07-19 NOTE — Progress Notes (Signed)
Glucose is elevated.  Creatinine is elevated and stable.  Calcium is normal.  Please forward results to her PCP.

## 2021-07-20 DIAGNOSIS — Z7984 Long term (current) use of oral hypoglycemic drugs: Secondary | ICD-10-CM | POA: Diagnosis not present

## 2021-07-20 DIAGNOSIS — Z7982 Long term (current) use of aspirin: Secondary | ICD-10-CM | POA: Diagnosis not present

## 2021-07-20 DIAGNOSIS — N189 Chronic kidney disease, unspecified: Secondary | ICD-10-CM | POA: Diagnosis not present

## 2021-07-20 DIAGNOSIS — M810 Age-related osteoporosis without current pathological fracture: Secondary | ICD-10-CM | POA: Diagnosis not present

## 2021-07-20 DIAGNOSIS — M545 Low back pain, unspecified: Secondary | ICD-10-CM | POA: Diagnosis not present

## 2021-07-20 DIAGNOSIS — Z8616 Personal history of COVID-19: Secondary | ICD-10-CM | POA: Diagnosis not present

## 2021-07-20 DIAGNOSIS — G8929 Other chronic pain: Secondary | ICD-10-CM | POA: Diagnosis not present

## 2021-07-20 DIAGNOSIS — Z794 Long term (current) use of insulin: Secondary | ICD-10-CM | POA: Diagnosis not present

## 2021-07-20 DIAGNOSIS — E1122 Type 2 diabetes mellitus with diabetic chronic kidney disease: Secondary | ICD-10-CM | POA: Diagnosis not present

## 2021-07-21 NOTE — Progress Notes (Signed)
?  Subjective:  ?Patient ID: Abigail Hoover, female    DOB: 1945-04-12,  MRN: [id number] ? ?Abigail Hoover presents to clinic today for at risk foot care with history of diabetic neuropathy and painful elongated mycotic toenails 1-5 bilaterally which are tender when wearing enclosed shoe gear. Pain is relieved with periodic professional debridement. ? ?Patient states blood glucose was 114 mg/dl today.   ? ?New problem(s): None.  ? ?PCP is Abigail Carol, MD. ? ?Allergies  ?Allergen Reactions  ? Atorvastatin Rash  ? Ezetimibe Itching  ? ? ?Review of Systems: Negative except as noted in the HPI. ? ?Objective: No changes noted in today's physical examination. ?Abigail Hoover is a pleasant 76 y.o. female in NAD. AAO x 3. ? ?There were no vitals filed for this visit. ? ?Vascular Examination: ?Neurovascular status unchanged b/l lower extremities. Capillary refill time to digits immediate b/l. Palpable DP pulses b/l. Palpable PT pulses b/l. Pedal hair sparse b/l. Skin temperature gradient within normal limits b/l. No edema noted b/l. ? ?Dermatological Examination: ?Pedal skin with normal turgor, texture and tone bilaterally. No open wounds bilaterally. No interdigital macerations bilaterally. Toenails 1-5 b/l elongated, discolored, dystrophic, thickened, crumbly with subungual debris and tenderness to dorsal palpation. ? ?Musculoskeletal: ?Normal muscle strength 5/5 to all lower extremity muscle groups bilaterally. No pain crepitus or joint limitation noted with ROM b/l. No gross bony deformities bilaterally. Patient ambulates independent of any assistive aids. ? ?Neurological Examination: ?Pt has subjective symptoms of neuropathy. Protective sensation intact 5/5 intact bilaterally with 10g monofilament b/l. Vibratory sensation intact b/l. Proprioception intact bilaterally. ? ? ?  Latest Ref Rng & Units 07/25/2020  ?  2:53 PM  ?Hemoglobin A1C  ?Hemoglobin-A1c 4.0 - 5.6 % 6.2    ? ?Assessment/Plan: ?1. Pain due to  onychomycosis of toenails of both feet   ?2. Diabetic peripheral neuropathy associated with type 2 diabetes mellitus ([place])   ?  ?-Patient was evaluated and treated. All patient's and/or POA's questions/concerns answered on today's visit. ?-Continue foot and shoe inspections daily. Monitor blood glucose per PCP/Endocrinologist's recommendations. ?-Patient to continue soft, supportive shoe gear daily. ?-Mycotic toenails 1-5 bilaterally were debrided in length and girth with sterile nail nippers and dremel without incident. ?-Patient/POA to call should there be question/concern in the interim.  ? ?Return in about 4 months (around 11/10/2021). ? ?Marzetta Board, DPM  ?

## 2021-07-23 ENCOUNTER — Telehealth: Payer: Self-pay

## 2021-07-23 DIAGNOSIS — S72032D Displaced midcervical fracture of left femur, subsequent encounter for closed fracture with routine healing: Secondary | ICD-10-CM | POA: Diagnosis not present

## 2021-07-23 DIAGNOSIS — M7062 Trochanteric bursitis, left hip: Secondary | ICD-10-CM | POA: Diagnosis not present

## 2021-07-23 NOTE — Telephone Encounter (Signed)
Abigail Hoover, PT with [facility] left a voicemail requesting verbal orders from Dr. Estanislado Pandy for patient's home health PT with frequency 1 time/week for 1 week, 2 times/week for 5 weeks, and 1 time/week for 1 week.  Please call back at [phone] ?

## 2021-07-23 NOTE — Telephone Encounter (Signed)
Please advise for verbal PT orders. Thanks!  ?

## 2021-07-24 NOTE — Telephone Encounter (Signed)
Returned call to [facility] and gave verbal orders for PT.  ?

## 2021-07-24 NOTE — Telephone Encounter (Signed)
Okay to send verbal PT orders.

## 2021-07-25 ENCOUNTER — Telehealth: Payer: Self-pay | Admitting: Hematology and Oncology

## 2021-07-25 NOTE — Telephone Encounter (Signed)
.  Called patient to schedule appointment per 5/2 inbasket, patient is aware of date and time.   ?

## 2021-07-27 ENCOUNTER — Other Ambulatory Visit: Payer: Self-pay | Admitting: Internal Medicine

## 2021-07-31 ENCOUNTER — Other Ambulatory Visit: Payer: Self-pay | Admitting: General Surgery

## 2021-07-31 DIAGNOSIS — Z1231 Encounter for screening mammogram for malignant neoplasm of breast: Secondary | ICD-10-CM

## 2021-08-11 ENCOUNTER — Other Ambulatory Visit: Payer: Self-pay | Admitting: Physician Assistant

## 2021-08-13 NOTE — Telephone Encounter (Signed)
Next Visit: 09/12/2021   Last Visit: 04/26/2021   DX: Chondrocalcinosis    Current Dose per office note 03/14/2021: colchicine 0.3 mg by mouth daily   Labs: 05/16/2021 Patient is anemic.  Glucose is elevated at 171.  Creatinine is elevated at 2.06.  Anemia most likely due to kidney disease.    Okay to refill Colchicine?

## 2021-08-14 ENCOUNTER — Encounter: Payer: Self-pay | Admitting: Hematology and Oncology

## 2021-08-14 ENCOUNTER — Ambulatory Visit: Payer: Medicare Other

## 2021-08-14 ENCOUNTER — Inpatient Hospital Stay: Payer: Medicare Other | Attending: Hematology and Oncology

## 2021-08-14 ENCOUNTER — Inpatient Hospital Stay (HOSPITAL_BASED_OUTPATIENT_CLINIC_OR_DEPARTMENT_OTHER): Payer: Medicare Other | Admitting: Hematology and Oncology

## 2021-08-14 ENCOUNTER — Inpatient Hospital Stay: Payer: Medicare Other

## 2021-08-14 DIAGNOSIS — E538 Deficiency of other specified B group vitamins: Secondary | ICD-10-CM | POA: Insufficient documentation

## 2021-08-14 DIAGNOSIS — E1122 Type 2 diabetes mellitus with diabetic chronic kidney disease: Secondary | ICD-10-CM | POA: Diagnosis not present

## 2021-08-14 DIAGNOSIS — D539 Nutritional anemia, unspecified: Secondary | ICD-10-CM

## 2021-08-14 DIAGNOSIS — Z7984 Long term (current) use of oral hypoglycemic drugs: Secondary | ICD-10-CM | POA: Diagnosis not present

## 2021-08-14 DIAGNOSIS — D631 Anemia in chronic kidney disease: Secondary | ICD-10-CM | POA: Insufficient documentation

## 2021-08-14 DIAGNOSIS — Z803 Family history of malignant neoplasm of breast: Secondary | ICD-10-CM | POA: Insufficient documentation

## 2021-08-14 DIAGNOSIS — Z79899 Other long term (current) drug therapy: Secondary | ICD-10-CM | POA: Diagnosis not present

## 2021-08-14 DIAGNOSIS — N184 Chronic kidney disease, stage 4 (severe): Secondary | ICD-10-CM | POA: Diagnosis not present

## 2021-08-14 DIAGNOSIS — N183 Chronic kidney disease, stage 3 unspecified: Secondary | ICD-10-CM | POA: Diagnosis not present

## 2021-08-14 LAB — CBC WITH DIFFERENTIAL/PLATELET
Abs Immature Granulocytes: 0.01 10*3/uL (ref 0.00–0.07)
Basophils Absolute: 0 10*3/uL (ref 0.0–0.1)
Basophils Relative: 0 %
Eosinophils Absolute: 0.2 10*3/uL (ref 0.0–0.5)
Eosinophils Relative: 5 %
HCT: 34.6 % — ABNORMAL LOW (ref 36.0–46.0)
Hemoglobin: 10.8 g/dL — ABNORMAL LOW (ref 12.0–15.0)
Immature Granulocytes: 0 %
Lymphocytes Relative: 28 %
Lymphs Abs: 1.4 10*3/uL (ref 0.7–4.0)
MCH: 27.3 pg (ref 26.0–34.0)
MCHC: 31.2 g/dL (ref 30.0–36.0)
MCV: 87.6 fL (ref 80.0–100.0)
Monocytes Absolute: 0.3 10*3/uL (ref 0.1–1.0)
Monocytes Relative: 6 %
Neutro Abs: 3.1 10*3/uL (ref 1.7–7.7)
Neutrophils Relative %: 61 %
Platelets: 169 10*3/uL (ref 150–400)
RBC: 3.95 MIL/uL (ref 3.87–5.11)
RDW: 14.6 % (ref 11.5–15.5)
WBC: 5.1 10*3/uL (ref 4.0–10.5)
nRBC: 0 % (ref 0.0–0.2)

## 2021-08-14 LAB — BASIC METABOLIC PANEL - CANCER CENTER ONLY
Anion gap: 6 (ref 5–15)
BUN: 34 mg/dL — ABNORMAL HIGH (ref 8–23)
CO2: 34 mmol/L — ABNORMAL HIGH (ref 22–32)
Calcium: 9.7 mg/dL (ref 8.9–10.3)
Chloride: 98 mmol/L (ref 98–111)
Creatinine: 2.07 mg/dL — ABNORMAL HIGH (ref 0.44–1.00)
GFR, Estimated: 24 mL/min — ABNORMAL LOW (ref >60)
Glucose, Bld: 337 mg/dL — ABNORMAL HIGH (ref 70–99)
Potassium: 4.3 mmol/L (ref 3.5–5.1)
Sodium: 138 mmol/L (ref 135–145)

## 2021-08-14 LAB — RETICULOCYTES
Immature Retic Fract: 8.9 % (ref 2.3–15.9)
RBC.: 3.93 MIL/uL (ref 3.87–5.11)
Retic Count, Absolute: 49.9 10*3/uL (ref 19.0–186.0)
Retic Ct Pct: 1.3 % (ref 0.4–3.1)

## 2021-08-14 LAB — VITAMIN B12: Vitamin B-12: 173 pg/mL — ABNORMAL LOW (ref 180–914)

## 2021-08-14 LAB — FERRITIN: Ferritin: 408 ng/mL — ABNORMAL HIGH (ref 11–307)

## 2021-08-14 LAB — SEDIMENTATION RATE: Sed Rate: 14 mm/hr (ref 0–22)

## 2021-08-14 MED ORDER — DARBEPOETIN ALFA 200 MCG/0.4ML IJ SOSY
200.0000 ug | PREFILLED_SYRINGE | Freq: Once | INTRAMUSCULAR | Status: AC
  Administered 2021-08-14: 200 ug via SUBCUTANEOUS
  Filled 2021-08-14: qty 0.4

## 2021-08-14 MED ORDER — CYANOCOBALAMIN 1000 MCG/ML IJ SOLN
1000.0000 ug | Freq: Once | INTRAMUSCULAR | Status: AC
  Administered 2021-08-14: 1000 ug via INTRAMUSCULAR
  Filled 2021-08-14: qty 1

## 2021-08-14 NOTE — Assessment & Plan Note (Signed)
She has multifactorial anemia She has combined recurrent iron deficiency anemia, B12 deficiency as well as anemia chronic kidney disease She is asymptomatic Based on the trend, she will only need erythropoietin stimulating agent every 6-9 months She will proceed with injection today I will see her again in 5 months for further follow-up

## 2021-08-14 NOTE — Assessment & Plan Note (Signed)
She has intermittent B12 deficiency She will receive B12 injection today and another dose when I see her next visit

## 2021-08-14 NOTE — Progress Notes (Signed)
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 has combined recurrent iron deficiency anemia, B12 deficiency as well as anemia chronic kidney disease She is asymptomatic Based on the trend, she will only need erythropoietin stimulating agent every 6-9 months She will proceed with injection today I will see her again in 5 months for further follow-up  Vitamin B 12 deficiency She has intermittent B12 deficiency She will receive B12 injection today and another dose when I see her next visit  Orders Placed This Encounter  Procedures   Vitamin B12    Standing Status:   Future    Standing Expiration Date:   08/15/2022   CBC with Differential ([place] Only)    Standing Status:   Future    Standing Expiration Date:   08/15/2022    The total time spent in the appointment was 20 minutes encounter with patients including review of chart and various tests results, discussions about plan of care and coordination of care plan   All questions were answered. The patient knows to call the clinic with any problems, questions or concerns. No barriers to learning was detected.    [name], MD 5/23/20231:15 PM  INTERVAL HISTORY: [name] 76 y.o. female returns for follow-up on history of B12 deficiency, anemia chronic kidney disease and high risk breast cancer history The patient had recent hip injury She is doing better She is here accompanied by her niece The patient denies any recent signs or symptoms of bleeding such as spontaneous epistaxis, hematuria or hematochezia.   SUMMARY OF HEMATOLOGIC HISTORY:  This is a patient that has been followed here since 2011 for chronic anemia In 2011, she had a bone marrow aspirate and biopsy that came back normal. The patient has been receiving Aranesp injection for chronic anemia up until 2014. It was subsequently  discontinued due stability of the anemia. Starting 05/27/2014, darbepoetin was resumed at 200 g every other month to keep hemoglobin greater than 10 g. Treatment was discontinued in October 2016 and resumed in September 2017 The patient is subsequently found to have severe vitamin B12 deficiency and is started on vitamin B12 injection on December 25, 2016 along with resumption of darbepoetin injection every 5 to 6 months.  She is also seen due to family history of breast cancer She also required intravenous iron infusion intermittently for concurrent iron deficiency anemia  I have reviewed the past medical history, past surgical history, social history and family history with the patient and they are unchanged from previous note.  ALLERGIES:  is allergic to atorvastatin and ezetimibe.  MEDICATIONS:  Current Outpatient Medications  Medication Sig Dispense Refill   amLODipine (NORVASC) 10 MG tablet Take by mouth.     aspirin EC 81 MG tablet Take 81 mg by mouth daily. Take 1 tablet by mouth once daily.     betamethasone dipropionate 0.05 % cream Apply 1 application topically 2 (two) times daily.     CARAFATE 1 GM/10ML suspension TAKE 10ML BY MOUTH 4 (FOUR) TIMES A DAY BETWEEN MEALS AND AT BEDTIME     cephALEXin (KEFLEX) 500 MG capsule Take 500 mg by mouth 2 (two) times daily.     colchicine 0.6 MG tablet TAKE ONE-HALF TABLET (0.3 MG TOTAL) BY MOUTH DAILY. 15 tablet 2   denosumab (PROLIA) 60 MG/ML SOSY injection Inject 60 mg into the skin once for 1 dose. Courier  to rheum: [address], [place] [address] by 05/24/21 1 mL 0   DEXILANT 60 MG capsule Take by mouth.     famotidine (PEPCID) 40 MG tablet Take 40 mg by mouth 2 (two) times daily.     fluocinonide cream (LIDEX) 5.36 % Apply 1 application topically 2 (two) times daily.     furosemide (LASIX) 40 MG tablet Take 40 mg by mouth daily.     gabapentin (NEURONTIN) 300 MG capsule Take 300 mg by mouth 3 (three) times daily.      glucose blood (ONETOUCH VERIO) test strip Use Onetouch verio test strips to check blood sugar three times daily. (90 day RX) 300 each 2   hydrocortisone 2.5 % cream Apply 1 application topically 4 (four) times daily.     ibuprofen (ADVIL) 600 MG tablet Take by mouth.     insulin glargine (LANTUS) 100 unit/mL SOPN Inject 5-15 Units into the skin daily. Increase 2 units every 2 days as needed. 15 mL 0   Insulin Pen Needle (PEN NEEDLES 3/16") 31G X 5 MM MISC Use 1 pen needle to inject Forteo daily. 100 each 2   Insulin Pen Needle 31G X 5 MM MISC Use with Novolog 100 each 1   Insulin Syringe-Needle U-100 25G X 5/8" 1 ML MISC USE DAILY TO INJECT INSULIN 100 each 3   isosorbide mononitrate (IMDUR) 60 MG 24 hr tablet SMARTSIG:1 By Mouth     JANUMET XR 50-500 MG TB24 SMARTSIG:1 By Mouth     JANUVIA 25 MG tablet TAKE ONE TABLET BY MOUTH ONCE DAILY 90 tablet 3   Lancet Devices (ONE TOUCH DELICA LANCING DEV) MISC 1 each by Does not apply route daily. Use as instructed to check blood sugar once daily. 1 each 0   Lancets (ONETOUCH DELICA PLUS [id number]) MISC TEST 3 TIMES DAILY 100 each 2   losartan-hydrochlorothiazide (HYZAAR) 100-25 MG tablet Take by mouth.     metoprolol succinate (TOPROL-XL) 50 MG 24 hr tablet Take 1 tablet (50 mg total) by mouth daily. Take with or immediately following a meal. 30 tablet 3   mirabegron ER (MYRBETRIQ) 25 MG TB24 tablet Take 25 mg by mouth daily.     mirabegron ER (MYRBETRIQ) 50 MG TB24 tablet Take by mouth. (Patient not taking: Reported on 03/14/2021)     mupirocin ointment (BACTROBAN) 2 % Apply topically 2 (two) times daily.     NOVOLOG FLEXPEN 100 UNIT/ML FlexPen INJECT 9-11 UNITS UNDER THE SKIN BEFORE the 3 meals 30 mL 3   pantoprazole (PROTONIX) 40 MG tablet Take 40 mg by mouth daily.     pentosan polysulfate (ELMIRON) 100 MG capsule TAKE 1 CAPSULE (100 MG TOTAL) BY MOUTH 3 TIMES DAILY BEFORE MEALS.     pravastatin (PRAVACHOL) 20 MG tablet TAKE ONE TABLET BY MOUTH ONCE  DAILY 15 tablet 0   Semaglutide (OZEMPIC, 1 MG/DOSE, [place]) Inject into the skin.     SYNTHROID 75 MCG tablet TAKE ONE TABLET BY MOUTH ONCE DAILY 90 tablet 1   torsemide (DEMADEX) 20 MG tablet Take 40 mg by mouth daily.     triamcinolone (KENALOG) 0.025 % ointment Apply 1 application topically 2 (two) times daily.     No current facility-administered medications for this visit.     REVIEW OF SYSTEMS:   Constitutional: Denies fevers, chills or night sweats Eyes: Denies blurriness of vision Ears, nose, mouth, throat, and face: Denies mucositis or sore throat Respiratory: Denies cough, dyspnea or wheezes Cardiovascular:  Denies palpitation, chest discomfort or lower extremity swelling Gastrointestinal:  Denies nausea, heartburn or change in bowel habits Skin: Denies abnormal skin rashes Lymphatics: Denies new lymphadenopathy or easy bruising Neurological:Denies numbness, tingling or new weaknesses Behavioral/Psych: Mood is stable, no new changes  All other systems were reviewed with the patient and are negative.  PHYSICAL EXAMINATION: ECOG PERFORMANCE STATUS: 0 - Asymptomatic  Vitals:   08/14/21 1055  BP: (!) 145/45  Pulse: 60  Resp: 18  Temp: 97.7 F (36.5 C)  SpO2: 100%   Filed Weights   08/14/21 1055  Weight: 127 lb 3.2 oz (57.7 kg)    GENERAL:alert, no distress and comfortable NEURO: alert & oriented x 3 with fluent speech, no focal motor/sensory deficits  LABORATORY DATA:  I have reviewed the data as listed     Component Value Date/Time   NA 138 08/14/2021 1043   NA 138 12/12/2016 1150   K 4.3 08/14/2021 1043   K 4.6 12/12/2016 1150   CL 98 08/14/2021 1043   CL 103 07/27/2012 1259   CO2 34 (H) 08/14/2021 1043   CO2 31 (H) 12/12/2016 1150   GLUCOSE 337 (H) 08/14/2021 1043   GLUCOSE 149 (H) 12/12/2016 1150   GLUCOSE 118 (H) 07/27/2012 1259   BUN 34 (H) 08/14/2021 1043   BUN 23.4 12/12/2016 1150   CREATININE 2.07 (H) 08/14/2021 1043   CREATININE 1.99 (H)  07/18/2021 1032   CREATININE 1.7 (H) 12/12/2016 1150   CALCIUM 9.7 08/14/2021 1043   CALCIUM 11.4 (H) 12/12/2016 1150   PROT 6.0 (L) 07/18/2021 1032   PROT 6.5 12/12/2016 1150   ALBUMIN 4.0 04/29/2019 1451   ALBUMIN 3.9 12/12/2016 1150   AST 20 07/18/2021 1032   AST 19 12/12/2016 1150   ALT 14 07/18/2021 1032   ALT 10 12/12/2016 1150   ALKPHOS 68 04/29/2019 1451   ALKPHOS 65 12/12/2016 1150   BILITOT 0.5 07/18/2021 1032   BILITOT 0.36 12/12/2016 1150   GFRNONAA 24 (L) 08/14/2021 1043   GFRNONAA 21 (L) 09/20/2019 1506   GFRAA 25 (L) 09/20/2019 1506    No results found for: SPEP, UPEP  Lab Results  Component Value Date   WBC 5.1 08/14/2021   NEUTROABS 3.1 08/14/2021   HGB 10.8 (L) 08/14/2021   HCT 34.6 (L) 08/14/2021   MCV 87.6 08/14/2021   PLT 169 08/14/2021      Chemistry      Component Value Date/Time   NA 138 08/14/2021 1043   NA 138 12/12/2016 1150   K 4.3 08/14/2021 1043   K 4.6 12/12/2016 1150   CL 98 08/14/2021 1043   CL 103 07/27/2012 1259   CO2 34 (H) 08/14/2021 1043   CO2 31 (H) 12/12/2016 1150   BUN 34 (H) 08/14/2021 1043   BUN 23.4 12/12/2016 1150   CREATININE 2.07 (H) 08/14/2021 1043   CREATININE 1.99 (H) 07/18/2021 1032   CREATININE 1.7 (H) 12/12/2016 1150      Component Value Date/Time   CALCIUM 9.7 08/14/2021 1043   CALCIUM 11.4 (H) 12/12/2016 1150   ALKPHOS 68 04/29/2019 1451   ALKPHOS 65 12/12/2016 1150   AST 20 07/18/2021 1032   AST 19 12/12/2016 1150   ALT 14 07/18/2021 1032   ALT 10 12/12/2016 1150   BILITOT 0.5 07/18/2021 1032   BILITOT 0.36 12/12/2016 1150

## 2021-08-16 ENCOUNTER — Ambulatory Visit: Payer: Medicare Other

## 2021-08-16 LAB — ERYTHROPOIETIN: Erythropoietin: 7.8 m[IU]/mL (ref 2.6–18.5)

## 2021-08-19 DIAGNOSIS — N189 Chronic kidney disease, unspecified: Secondary | ICD-10-CM | POA: Diagnosis not present

## 2021-08-19 DIAGNOSIS — Z794 Long term (current) use of insulin: Secondary | ICD-10-CM | POA: Diagnosis not present

## 2021-08-19 DIAGNOSIS — Z7982 Long term (current) use of aspirin: Secondary | ICD-10-CM | POA: Diagnosis not present

## 2021-08-19 DIAGNOSIS — Z7984 Long term (current) use of oral hypoglycemic drugs: Secondary | ICD-10-CM | POA: Diagnosis not present

## 2021-08-19 DIAGNOSIS — M545 Low back pain, unspecified: Secondary | ICD-10-CM | POA: Diagnosis not present

## 2021-08-19 DIAGNOSIS — E1122 Type 2 diabetes mellitus with diabetic chronic kidney disease: Secondary | ICD-10-CM | POA: Diagnosis not present

## 2021-08-19 DIAGNOSIS — G8929 Other chronic pain: Secondary | ICD-10-CM | POA: Diagnosis not present

## 2021-08-19 DIAGNOSIS — M810 Age-related osteoporosis without current pathological fracture: Secondary | ICD-10-CM | POA: Diagnosis not present

## 2021-08-19 DIAGNOSIS — Z8616 Personal history of COVID-19: Secondary | ICD-10-CM | POA: Diagnosis not present

## 2021-08-23 ENCOUNTER — Ambulatory Visit
Admission: RE | Admit: 2021-08-23 | Discharge: 2021-08-23 | Disposition: A | Discharge status: Home / Self Care | Payer: Medicare Other | Source: Ambulatory Visit | Attending: General Surgery | Admitting: General Surgery

## 2021-08-23 DIAGNOSIS — Z1231 Encounter for screening mammogram for malignant neoplasm of breast: Secondary | ICD-10-CM

## 2021-08-28 ENCOUNTER — Ambulatory Visit: Payer: Medicare Other | Admitting: Hematology and Oncology

## 2021-08-28 ENCOUNTER — Ambulatory Visit: Payer: Medicare Other

## 2021-08-28 ENCOUNTER — Other Ambulatory Visit: Payer: Medicare Other

## 2021-08-29 ENCOUNTER — Telehealth: Payer: Self-pay | Admitting: Pharmacist

## 2021-08-29 ENCOUNTER — Telehealth: Payer: Self-pay | Admitting: Rheumatology

## 2021-08-29 NOTE — Telephone Encounter (Signed)
Patient called the office requesting a call back from [facility]. [phone]

## 2021-08-29 NOTE — Telephone Encounter (Signed)
OK to continue PT

## 2021-08-29 NOTE — Telephone Encounter (Signed)
Gracee from [facility] called requesting approval to continue PT 2 times/week for 2 weeks.  Phone  [phone]

## 2021-08-29 NOTE — Telephone Encounter (Signed)
Patient called stating she received two Forteo pens from [place] at her old address. However they have been sitting at room temperature for several days. Since she has already transitioned to Prolia, she is unable to take Forteo. Additionally, Forteo is not stable now that both pens have been at room temperature for several days. Patient has been advised to discard Forteo pens.  Knox Saliva, PharmD, MPH, BCPS, CPP Clinical Pharmacist (Rheumatology and Pulmonology)

## 2021-08-29 NOTE — Telephone Encounter (Signed)
Error

## 2021-08-29 NOTE — Progress Notes (Deleted)
Office Visit Note  Patient: Abigail Hoover             Date of Birth: 1945-08-09           MRN: [id number]             PCP: [name], MD Referring: [name], MD Visit Date: 09/12/2021 Occupation: '@GUAROCC'$ @  Subjective:    History of Present Illness: Abigail Hoover is a 76 y.o. female with history of osteoporosis, DDD, and osteoarthritis. She is on prolia 60 mg sq injections every 6 months.   Prolia on 07/05/21   Activities of Daily Living:  Patient reports morning stiffness for *** {minute/hour:19697}.   Patient {ACTIONS;DENIES/REPORTS:21021675::"Denies"} nocturnal pain.  Difficulty dressing/grooming: {ACTIONS;DENIES/REPORTS:21021675::"Denies"} Difficulty climbing stairs: {ACTIONS;DENIES/REPORTS:21021675::"Denies"} Difficulty getting out of chair: {ACTIONS;DENIES/REPORTS:21021675::"Denies"} Difficulty using hands for taps, buttons, cutlery, and/or writing: {ACTIONS;DENIES/REPORTS:21021675::"Denies"}  No Rheumatology ROS completed.   PMFS History:  Patient Active Problem List   Diagnosis Date Noted   DDD (degenerative disc disease), lumbar 08/26/2019   Age-related osteoporosis without current pathological fracture 08/26/2019   Primary osteoarthritis of both knees 08/26/2019   Primary osteoarthritis of both hands 08/26/2019   Diabetes mellitus with chronic kidney disease, with long-term current use of insulin ([place]) 05/11/2019   Pain due to onychomycosis of toenails of both feet 09/30/2018   Trigger thumb of right hand 09/02/2018   Chronic pain of right thumb 08/27/2018   Plantar fasciitis of left foot 05/19/2018   Lower limb pain, inferior, left 04/28/2018   Dry skin 03/12/2018   Neck pain 01/27/2018   Other fatigue 09/18/2017   Pain and swelling of right wrist 08/06/2017   At high risk for breast cancer 03/06/2017   Hypercalcemia 12/16/2016   Vitamin B 12 deficiency 12/16/2016   Displaced fracture of left femoral neck ([place]) 03/02/2016   Hyperkalemia  03/01/2016   Femoral neck fracture ([place]) 02/29/2016   Anemia due to stage 3 (moderate) chronic renal failure treated with erythropoietin ([place]) 12/18/2015   Chronic kidney disease, stage III (moderate) ([place]) 08/14/2015   Anemia in chronic kidney disease 05/01/2015   Acute coronary syndrome ([place]) 01/02/2015   Lumbar spondylosis with myelopathy 06/27/2014   Deficiency anemia 05/27/2014   Carpal tunnel syndrome 12/09/2013   Diabetic neuropathy ([place]) 12/09/2013   Type 2 diabetes mellitus with stage 4 chronic kidney disease, with long-term current use of insulin ([place]) 04/21/2013   Disturbance of skin sensation 04/21/2013   Hypothyroidism 10/30/2012   Breast mass in female-left 03/10/2012   Weight loss 11/28/2011   Increased frequency of urination 10/16/2011   Urinary urgency 10/16/2011   Family history of breast cancer 08/01/2010   Essential hypertension 02/01/2009   EDEMA LEGS 02/01/2009    Past Medical History:  Diagnosis Date   Anemia in chronic kidney disease 05/01/2015   Anxiety    Arthritis    Bladder irritation    Diabetes mellitus    GERD (gastroesophageal reflux disease)    Hypercholesterolemia    Hypertension    Hypothyroidism    Iron deficiency anemia, unspecified    Psoriasis    Transfusion history 03/05/2016   for postoperative (ORIF) anemia superimposed on chronic anemia    Family History  Problem Relation Age of Onset   Heart disease Mother        heart attack   Stroke Father        brain hemorrhage   Diabetes Brother    Breast cancer Sister    Cancer Sister  breast cancer   Past Surgical History:  Procedure Laterality Date   ABDOMINAL HYSTERECTOMY     TAH   BREAST BIOPSY     Left breast biopsy benign lesion   BREAST BIOPSY  03/11/2012   Procedure: BREAST BIOPSY;  Surgeon: Odis Hollingshead, MD;  Location: [facility];  Service: General;  Laterality: Left;  remove left breast mass   BREAST EXCISIONAL BIOPSY Left    CARDIAC CATHETERIZATION N/A 01/02/2015    Procedure: Left Heart Cath and Coronary Angiography;  Surgeon: Charolette Forward, MD;  Location: [facility];  Service: Cardiovascular;  Laterality: N/A;   CATARACT EXTRACTION     CERVICAL DISC SURGERY     COLONOSCOPY  2013   neg. next 2023.    EYE SURGERY     IR KYPHO EA ADDL LEVEL THORACIC OR LUMBAR  09/09/2019   IR KYPHO LUMBAR INC FX REDUCE BONE BX UNI/BIL CANNULATION INC/IMAGING  09/09/2019   IR RADIOLOGIST EVAL & MGMT  09/08/2019   LEFT HIP SURGERY     TOTAL HIP ARTHROPLASTY Left 03/02/2016   Procedure: TOTAL HIP ARTHROPLASTY ANTERIOR APPROACH;  Surgeon: Rod Can, MD;  Location: [facility];  Service: Orthopedics;  Laterality: Left;   Social History   Social History Narrative   Patient is married with one child.   Patient is right handed.   Patient has college education.   Patient drinks tea, two times a day   Immunization History  Administered Date(s) Administered   Fluad Quad(high Dose 65+) 12/22/2018   Influenza Split 11/13/2010   Influenza, High Dose Seasonal PF 01/25/2016, 12/24/2016, 01/03/2018   Influenza,inj,Quad PF,6+ Mos 12/29/2012, 12/20/2013, 01/16/2015   Influenza,inj,quad, With Preservative 01/03/2018, 12/24/2018   Pneumococcal Conjugate-13 01/16/2015   Pneumococcal Polysaccharide-23 03/24/2013     Objective: Vital Signs: There were no vitals taken for this visit.   Physical Exam Vitals and nursing note reviewed.  Constitutional:      Appearance: She is well-developed.  HENT:     Head: Normocephalic and atraumatic.  Eyes:     Conjunctiva/sclera: Conjunctivae normal.  Cardiovascular:     Rate and Rhythm: Normal rate and regular rhythm.     Heart sounds: Normal heart sounds.  Pulmonary:     Effort: Pulmonary effort is normal.     Breath sounds: Normal breath sounds.  Abdominal:     General: Bowel sounds are normal.     Palpations: Abdomen is soft.  Musculoskeletal:     Cervical back: Normal range of motion.  Skin:    General: Skin is warm and  dry.     Capillary Refill: Capillary refill takes less than 2 seconds.  Neurological:     Mental Status: She is alert and oriented to person, place, and time.  Psychiatric:        Behavior: Behavior normal.      Musculoskeletal Exam: ***  CDAI Exam: CDAI Score: -- Patient Global: --; Provider Global: -- Swollen: --; Tender: -- Joint Exam 09/12/2021   No joint exam has been documented for this visit   There is currently no information documented on the homunculus. Go to the Rheumatology activity and complete the homunculus joint exam.  Investigation: No additional findings.  Imaging: MM 3D SCREEN BREAST BILATERAL  Result Date: 08/24/2021 CLINICAL DATA:  Screening. EXAM: DIGITAL SCREENING BILATERAL MAMMOGRAM WITH TOMOSYNTHESIS AND CAD TECHNIQUE: Bilateral screening digital craniocaudal and mediolateral oblique mammograms were obtained. Bilateral screening digital breast tomosynthesis was performed. The images were evaluated with computer-aided detection. COMPARISON:  Previous exam(s). ACR Breast Density Category c: The breast tissue is heterogeneously dense, which may obscure small masses. FINDINGS: There are no findings suspicious for malignancy. IMPRESSION: No mammographic evidence of malignancy. A result letter of this screening mammogram will be mailed directly to the patient. RECOMMENDATION: Screening mammogram in one year. (Code:SM-B-01Y) BI-RADS CATEGORY  1: Negative. Electronically Signed   By: Kristopher Oppenheim M.D.   On: 08/24/2021 16:19    Recent Labs: Lab Results  Component Value Date   WBC 5.1 08/14/2021   HGB 10.8 (L) 08/14/2021   PLT 169 08/14/2021   NA 138 08/14/2021   K 4.3 08/14/2021   CL 98 08/14/2021   CO2 34 (H) 08/14/2021   GLUCOSE 337 (H) 08/14/2021   BUN 34 (H) 08/14/2021   CREATININE 2.07 (H) 08/14/2021   BILITOT 0.5 07/18/2021   ALKPHOS 68 04/29/2019   AST 20 07/18/2021   ALT 14 07/18/2021   PROT 6.0 (L) 07/18/2021   ALBUMIN 4.0 04/29/2019   CALCIUM  9.7 08/14/2021   GFRAA 25 (L) 09/20/2019    Speciality Comments: Forteo started 09/29/2019  Procedures:  No procedures performed Allergies: Atorvastatin and Ezetimibe   Assessment / Plan:     Visit Diagnoses: Age-related osteoporosis with current pathological fracture, sequela  History of vertebral fracture  Other secondary kyphosis, thoracolumbar region  Vertebral fracture, osteoporotic, sequela  Displaced fracture of left femoral neck (HCC)  DDD (degenerative disc disease), lumbar  Facet arthropathy, lumbar  Primary osteoarthritis of both hands  Primary osteoarthritis of both knees  Chondrocalcinosis  Hyperuricemia  Psoriasis  Essential hypertension  History of diabetes mellitus  History of hypothyroidism  Orders: No orders of the defined types were placed in this encounter.  No orders of the defined types were placed in this encounter.   Face-to-face time spent with patient was *** minutes. Greater than 50% of time was spent in counseling and coordination of care.  Follow-Up Instructions: No follow-ups on file.   Ofilia Neas, PA-C  Note - This record has been created using Dragon software.  Chart creation errors have been sought, but may not always  have been located. Such creation errors do not reflect on  the standard of medical care.

## 2021-08-29 NOTE — Telephone Encounter (Signed)
Returned call and left message for Gracee to advise okay to continue PT  2 times per week for 2 weeks. Advised to call if she has any questions or concerns.

## 2021-09-06 DIAGNOSIS — N184 Chronic kidney disease, stage 4 (severe): Secondary | ICD-10-CM | POA: Diagnosis not present

## 2021-09-06 DIAGNOSIS — E039 Hypothyroidism, unspecified: Secondary | ICD-10-CM | POA: Diagnosis not present

## 2021-09-06 DIAGNOSIS — E1142 Type 2 diabetes mellitus with diabetic polyneuropathy: Secondary | ICD-10-CM | POA: Diagnosis not present

## 2021-09-06 DIAGNOSIS — E1122 Type 2 diabetes mellitus with diabetic chronic kidney disease: Secondary | ICD-10-CM | POA: Diagnosis not present

## 2021-09-12 ENCOUNTER — Ambulatory Visit: Payer: Medicare Other | Admitting: Physician Assistant

## 2021-09-12 DIAGNOSIS — M47816 Spondylosis without myelopathy or radiculopathy, lumbar region: Secondary | ICD-10-CM

## 2021-09-12 DIAGNOSIS — Z8639 Personal history of other endocrine, nutritional and metabolic disease: Secondary | ICD-10-CM

## 2021-09-12 DIAGNOSIS — L409 Psoriasis, unspecified: Secondary | ICD-10-CM

## 2021-09-12 DIAGNOSIS — M19041 Primary osteoarthritis, right hand: Secondary | ICD-10-CM

## 2021-09-12 DIAGNOSIS — M5136 Other intervertebral disc degeneration, lumbar region: Secondary | ICD-10-CM

## 2021-09-12 DIAGNOSIS — E790 Hyperuricemia without signs of inflammatory arthritis and tophaceous disease: Secondary | ICD-10-CM

## 2021-09-12 DIAGNOSIS — M8008XS Age-related osteoporosis with current pathological fracture, vertebra(e), sequela: Secondary | ICD-10-CM

## 2021-09-12 DIAGNOSIS — S72002A Fracture of unspecified part of neck of left femur, initial encounter for closed fracture: Secondary | ICD-10-CM

## 2021-09-12 DIAGNOSIS — M4015 Other secondary kyphosis, thoracolumbar region: Secondary | ICD-10-CM

## 2021-09-12 DIAGNOSIS — I10 Essential (primary) hypertension: Secondary | ICD-10-CM

## 2021-09-12 DIAGNOSIS — M1120 Other chondrocalcinosis, unspecified site: Secondary | ICD-10-CM

## 2021-09-12 DIAGNOSIS — M170 Bilateral primary osteoarthritis of knee: Secondary | ICD-10-CM

## 2021-09-12 DIAGNOSIS — M8000XS Age-related osteoporosis with current pathological fracture, unspecified site, sequela: Secondary | ICD-10-CM

## 2021-09-12 DIAGNOSIS — Z8781 Personal history of (healed) traumatic fracture: Secondary | ICD-10-CM

## 2021-09-14 ENCOUNTER — Ambulatory Visit: Payer: Medicare Other | Admitting: Hematology and Oncology

## 2021-09-14 ENCOUNTER — Ambulatory Visit: Payer: Medicare Other

## 2021-09-14 ENCOUNTER — Other Ambulatory Visit: Payer: Medicare Other

## 2021-09-28 ENCOUNTER — Telehealth: Payer: Self-pay | Admitting: *Deleted

## 2021-09-28 DIAGNOSIS — M4015 Other secondary kyphosis, thoracolumbar region: Secondary | ICD-10-CM

## 2021-09-28 DIAGNOSIS — G8929 Other chronic pain: Secondary | ICD-10-CM

## 2021-09-28 DIAGNOSIS — Z8781 Personal history of (healed) traumatic fracture: Secondary | ICD-10-CM

## 2021-09-28 NOTE — Telephone Encounter (Signed)
Attempted to contact the patient and left message for patient to call the office.  

## 2021-09-28 NOTE — Telephone Encounter (Signed)
Patient has finished the course for Forteo.  Forteo cannot be restarted.  Forteo and Prolia do not provide relief to lower back pain.  For the lower back pain she can be referred to pain management.

## 2021-09-28 NOTE — Telephone Encounter (Signed)
Patient contacted the office stating she is having increased back pain. Patient states she has started on the Prolia and has had no relief. Patient is requesting to be switched back to [facility].

## 2021-10-01 ENCOUNTER — Telehealth: Payer: Self-pay | Admitting: Rheumatology

## 2021-10-01 NOTE — Telephone Encounter (Signed)
Patient called the office stating she missed a call from Seth Bake and requests a call back.

## 2021-10-01 NOTE — Addendum Note (Signed)
Addended by: Carole Binning on: 10/01/2021 10:43 AM   Modules accepted: Orders

## 2021-10-01 NOTE — Telephone Encounter (Signed)
Patient advised she has finished the course for Forteo.  Forteo cannot be restarted.  Forteo and Prolia do not provide relief to lower back pain.  For the lower back pain she can be referred to pain management. Patient is in agreement for referral to pain management. Referral placed.

## 2021-10-01 NOTE — Telephone Encounter (Signed)
See previous phone note.  

## 2021-10-03 ENCOUNTER — Encounter: Payer: Self-pay | Admitting: Physical Medicine and Rehabilitation

## 2021-10-15 DIAGNOSIS — N184 Chronic kidney disease, stage 4 (severe): Secondary | ICD-10-CM | POA: Diagnosis not present

## 2021-10-22 DIAGNOSIS — N1832 Chronic kidney disease, stage 3b: Secondary | ICD-10-CM | POA: Diagnosis not present

## 2021-10-22 DIAGNOSIS — D631 Anemia in chronic kidney disease: Secondary | ICD-10-CM | POA: Diagnosis not present

## 2021-10-22 DIAGNOSIS — I129 Hypertensive chronic kidney disease with stage 1 through stage 4 chronic kidney disease, or unspecified chronic kidney disease: Secondary | ICD-10-CM | POA: Diagnosis not present

## 2021-10-22 DIAGNOSIS — N2581 Secondary hyperparathyroidism of renal origin: Secondary | ICD-10-CM | POA: Diagnosis not present

## 2021-10-24 ENCOUNTER — Other Ambulatory Visit (HOSPITAL_COMMUNITY): Payer: Self-pay

## 2021-10-24 ENCOUNTER — Other Ambulatory Visit: Payer: Self-pay | Admitting: Rheumatology

## 2021-10-24 ENCOUNTER — Ambulatory Visit: Payer: Self-pay

## 2021-10-24 DIAGNOSIS — M8000XS Age-related osteoporosis with current pathological fracture, unspecified site, sequela: Secondary | ICD-10-CM

## 2021-10-24 NOTE — Patient Outreach (Signed)
  Care Coordination   10/24/2021 Name: GERTIE BROERMAN MRN: [id number] DOB: 02-Apr-1945   Care Coordination Outreach Attempts:  An unsuccessful telephone outreach was attempted today to offer the patient information about available care coordination services as a benefit of their health plan.   Follow Up Plan:  Additional outreach attempts will be made to offer the patient care coordination information and services.   Encounter Outcome:  No Answer  Care Coordination Interventions Activated:  No   Care Coordination Interventions:  No, not indicated    [facility] [phone]

## 2021-11-01 ENCOUNTER — Other Ambulatory Visit (HOSPITAL_COMMUNITY): Payer: Self-pay

## 2021-11-05 ENCOUNTER — Other Ambulatory Visit (HOSPITAL_COMMUNITY): Payer: Self-pay

## 2021-11-05 DIAGNOSIS — E119 Type 2 diabetes mellitus without complications: Secondary | ICD-10-CM | POA: Diagnosis not present

## 2021-11-05 DIAGNOSIS — I10 Essential (primary) hypertension: Secondary | ICD-10-CM | POA: Diagnosis not present

## 2021-11-05 DIAGNOSIS — I25118 Atherosclerotic heart disease of native coronary artery with other forms of angina pectoris: Secondary | ICD-10-CM | POA: Diagnosis not present

## 2021-11-05 DIAGNOSIS — E785 Hyperlipidemia, unspecified: Secondary | ICD-10-CM | POA: Diagnosis not present

## 2021-11-13 ENCOUNTER — Telehealth: Payer: Self-pay | Admitting: *Deleted

## 2021-11-13 NOTE — Telephone Encounter (Signed)
[name] advised Dr. Estanislado Pandy approved prescription for back brace. Advised prescription is ready for pick up.

## 2021-11-13 NOTE — Telephone Encounter (Signed)
Okay to give a prescription for back brace.

## 2021-11-13 NOTE — Telephone Encounter (Signed)
Patient's daughter called stating that we wrote a prescription for a back brace previously and that the brace is worn out. Patient's daughter is requesting we write a prescription for a new back brace. Please advise.

## 2021-11-14 ENCOUNTER — Other Ambulatory Visit (HOSPITAL_COMMUNITY): Payer: Self-pay

## 2021-11-30 ENCOUNTER — Encounter
Payer: Medicare Other | Attending: Physical Medicine and Rehabilitation | Admitting: Physical Medicine and Rehabilitation

## 2021-11-30 VITALS — BP 123/70 | HR 59 | Ht <= 58 in | Wt 130.4 lb

## 2021-11-30 DIAGNOSIS — Z96649 Presence of unspecified artificial hip joint: Secondary | ICD-10-CM | POA: Diagnosis not present

## 2021-11-30 DIAGNOSIS — N189 Chronic kidney disease, unspecified: Secondary | ICD-10-CM | POA: Insufficient documentation

## 2021-11-30 DIAGNOSIS — M19041 Primary osteoarthritis, right hand: Secondary | ICD-10-CM | POA: Diagnosis not present

## 2021-11-30 DIAGNOSIS — T8484XS Pain due to internal orthopedic prosthetic devices, implants and grafts, sequela: Secondary | ICD-10-CM | POA: Diagnosis not present

## 2021-11-30 DIAGNOSIS — M4856XA Collapsed vertebra, not elsewhere classified, lumbar region, initial encounter for fracture: Secondary | ICD-10-CM | POA: Insufficient documentation

## 2021-11-30 DIAGNOSIS — M19042 Primary osteoarthritis, left hand: Secondary | ICD-10-CM | POA: Diagnosis not present

## 2021-11-30 MED ORDER — VITAMIN D (ERGOCALCIFEROL) 1.25 MG (50000 UNIT) PO CAPS: 50000.0000 [IU] | ORAL_CAPSULE | ORAL | 0 refills | Status: DC

## 2021-11-30 NOTE — Progress Notes (Signed)
Subjective:    Patient ID: Abigail Hoover, female    DOB: 07-18-1945, 76 y.o.   MRN: [id number]  HPI Abigail Hoover is a 76 year old woman who presents to establish care for chronic back pain.  1) Chronic back pain s/p fractures -pain has been stable since fall in June 2021 -she was receiving injections -she was ordered a back brace but this does not help -she was taking Forteo and Prolia  -pain is sometimes 5-8/10 -she cannot bend forward -she gets pain when walking  2) s/p hip replacement, left side -gets pain in left side  3) CKD: -cannot take NSAIDs  Pain Inventory Average Pain 8 Pain Right Now 7 My pain is constant and aching  In the last 24 hours, has pain interfered with the following? General activity 8 Relation with others 8 Enjoyment of life 8 What TIME of day is your pain at its worst? morning , daytime, evening, and night Sleep (in general) Fair  Pain is worse with: walking, bending, sitting, and inactivity Pain improves with: rest Relief from Meds:  na  walk without assistance how many minutes can you walk? 5 ability to climb steps?  no do you drive?  no  retired  No problems in this area  New pt  New pt    Family History  Problem Relation Age of Onset   Heart disease Mother        heart attack   Stroke Father        brain hemorrhage   Diabetes Brother    Breast cancer Sister    Cancer Sister        breast cancer   Social History   Socioeconomic History   Marital status: Married    Spouse name: Not on file   Number of children: 1   Years of education: college   Highest education level: Not on file  Occupational History   Occupation: Homemaker  Tobacco Use   Smoking status: Never   Smokeless tobacco: Never  Vaping Use   Vaping Use: Never used  Substance and Sexual Activity   Alcohol use: No    Alcohol/week: 0.0 standard drinks of alcohol   Drug use: No   Sexual activity: Not Currently  Other Topics Concern   Not on file   Social History Narrative   Patient is married with one child.   Patient is right handed.   Patient has college education.   Patient drinks tea, two times a day   Social Determinants of Health   Financial Resource Strain: Not on file  Food Insecurity: Not on file  Transportation Needs: Not on file  Physical Activity: Not on file  Stress: Not on file  Social Connections: Not on file   Past Surgical History:  Procedure Laterality Date   ABDOMINAL HYSTERECTOMY     TAH   BREAST BIOPSY     Left breast biopsy benign lesion   BREAST BIOPSY  03/11/2012   Procedure: BREAST BIOPSY;  Surgeon: Odis Hollingshead, MD;  Location: [facility];  Service: General;  Laterality: Left;  remove left breast mass   BREAST EXCISIONAL BIOPSY Left    CARDIAC CATHETERIZATION N/A 01/02/2015   Procedure: Left Heart Cath and Coronary Angiography;  Surgeon: Charolette Forward, MD;  Location: [facility];  Service: Cardiovascular;  Laterality: N/A;   CATARACT EXTRACTION     CERVICAL DISC SURGERY     COLONOSCOPY  2013   neg. next 2023.  EYE SURGERY     IR KYPHO EA ADDL LEVEL THORACIC OR LUMBAR  09/09/2019   IR KYPHO LUMBAR INC FX REDUCE BONE BX UNI/BIL CANNULATION INC/IMAGING  09/09/2019   IR RADIOLOGIST EVAL & MGMT  09/08/2019   LEFT HIP SURGERY     TOTAL HIP ARTHROPLASTY Left 03/02/2016   Procedure: TOTAL HIP ARTHROPLASTY ANTERIOR APPROACH;  Surgeon: Rod Can, MD;  Location: [facility];  Service: Orthopedics;  Laterality: Left;   Past Medical History:  Diagnosis Date   Anemia in chronic kidney disease 05/01/2015   Anxiety    Arthritis    Bladder irritation    Diabetes mellitus    GERD (gastroesophageal reflux disease)    Hypercholesterolemia    Hypertension    Hypothyroidism    Iron deficiency anemia, unspecified    Psoriasis    Transfusion history 03/05/2016   for postoperative (ORIF) anemia superimposed on chronic anemia   BP 123/70   Pulse (!) 59   Ht '4\' 8"'$  (1.422 m)   Wt 130 lb 6.4 oz (59.1  kg)   SpO2 98%   BMI 29.24 kg/m   Opioid Risk Score:   Fall Risk Score:  `1  Depression screen [facility] 2/9     11/30/2021   10:47 AM  Depression screen PHQ 2/9  Decreased Interest 0  Down, Depressed, Hopeless 0  PHQ - 2 Score 0  Altered sleeping 0  Tired, decreased energy 0  Change in appetite 0  Feeling bad or failure about yourself  0  Trouble concentrating 0  Moving slowly or fidgety/restless 0  Suicidal thoughts 0  PHQ-9 Score 0  Difficult doing work/chores Not difficult at all     Review of Systems  Musculoskeletal:  Positive for back pain.  All other systems reviewed and are negative.     Objective:   Physical Exam Gen: no distress, normal appearing HEENT: oral mucosa pink and moist, NCAT Cardio: Reg rate Chest: normal effort, normal rate of breathing Abd: soft, non-distended Ext: no edema Psych: pleasant, normal affect Skin: intact Neuro: TTP low back pain Musculoskeletal: kyphotic posture     Assessment & Plan:   1) Compression fracture: -referred to PT for core and paraspinal strengthening, HEP -Vitamin D level and parathyroid hormone levels ordered -discussed foods that can increase bone strength and health  2) Hip pain with history of left hip replacement -discussed extracoporeal showckwave therapy and she would like to try next visit. Messaged April to try to set up weekly appointments for 3 sessions at this time.   3) CKD: -check creatinine today as per daughter's request  4) Primary OA of bilateral hands -recommended applying blue emu oil on both hands.

## 2021-11-30 NOTE — Patient Instructions (Signed)
Discussed following foods that may reduce pain: 1) Ginger (especially studied for arthritis)- reduce leukotriene production to decrease inflammation 2) Blueberries- high in phytonutrients that decrease inflammation 3) Salmon- marine omega-3s reduce joint swelling and pain 4) Pumpkin seeds- reduce inflammation 5) dark chocolate- reduces inflammation 6) turmeric- reduces inflammation 7) tart cherries - reduce pain and stiffness 8) extra virgin olive oil - its compound olecanthal helps to block prostaglandins  9) chili peppers- can be eaten or applied topically via capsaicin 10) mint- helpful for headache, muscle aches, joint pain, and itching 11) garlic- reduces inflammation  Link to further information on diet for chronic pain: [email or web address]   Foods  Figs Bok choy 6 prunes  Cheese

## 2021-12-01 LAB — BASIC METABOLIC PANEL
BUN/Creatinine Ratio: 21 (ref 12–28)
BUN: 36 mg/dL — ABNORMAL HIGH (ref 8–27)
CO2: 26 mmol/L (ref 20–29)
Calcium: 10.1 mg/dL (ref 8.7–10.3)
Chloride: 99 mmol/L (ref 96–106)
Creatinine, Ser: 1.7 mg/dL — ABNORMAL HIGH (ref 0.57–1.00)
Glucose: 148 mg/dL — ABNORMAL HIGH (ref 70–99)
Potassium: 4.4 mmol/L (ref 3.5–5.2)
Sodium: 143 mmol/L (ref 134–144)
eGFR: 31 mL/min/{1.73_m2} — ABNORMAL LOW (ref >59)

## 2021-12-01 LAB — VITAMIN D 25 HYDROXY (VIT D DEFICIENCY, FRACTURES): Vit D, 25-Hydroxy: 30.2 ng/mL (ref 30.0–100.0)

## 2021-12-01 LAB — PARATHYROID HORMONE, INTACT (NO CA): PTH: 107 pg/mL — ABNORMAL HIGH (ref 15–65)

## 2021-12-04 ENCOUNTER — Telehealth: Payer: Self-pay | Admitting: *Deleted

## 2021-12-04 NOTE — Telephone Encounter (Signed)
Please call the daughters mobile number  [phone]

## 2021-12-04 NOTE — Telephone Encounter (Signed)
Mrs Dillow daughter called requesting a call about the results of her bloodwork.

## 2021-12-05 ENCOUNTER — Encounter (HOSPITAL_BASED_OUTPATIENT_CLINIC_OR_DEPARTMENT_OTHER): Payer: Medicare Other | Admitting: Physical Medicine and Rehabilitation

## 2021-12-05 DIAGNOSIS — N189 Chronic kidney disease, unspecified: Secondary | ICD-10-CM

## 2021-12-05 DIAGNOSIS — M4856XA Collapsed vertebra, not elsewhere classified, lumbar region, initial encounter for fracture: Secondary | ICD-10-CM | POA: Diagnosis not present

## 2021-12-05 NOTE — Progress Notes (Signed)
Subjective:    Patient ID: Abigail Hoover, female    DOB: [birth year], 76 y.o.   MRN: [id number]  HPI An audio/video tele-health visit is felt to be the most appropriate encounter for this patient at this time. This is a follow up tele-visit via phone. The patient is at home. MD is at office. Prior to scheduling this appointment, our staff discussed the limitations of evaluation and management by telemedicine and the availability of in-person appointments. The patient expressed understanding and agreed to proceed.   Abigail Hoover is a 76 year old woman who presents for follow-up chronic back pain.  1) Chronic back pain s/p fractures -pain has been stable since fall in June 2021 -she was receiving injections -she was ordered a back brace but this does not help -she was taking Forteo and Prolia  -pain is sometimes 5-8/10 -she cannot bend forward -she gets pain when walking -daughter asks about turmeric, in what form she should take it, how to avoid heavy metals with it, whether to use black pepper with it -daughter prefers to pursue non-medication management  2) s/p hip replacement, left side -gets pain in left side -she is scheduled for extracorporeal shockwave therapy tomorrow  3) CKD: -cannot take NSAIDs -daughter asks about recent creatinine level and she notes that most recent creatinine was 1.6  Pain Inventory Average Pain 8 Pain Right Now 7 My pain is constant and aching  In the last 24 hours, has pain interfered with the following? General activity 8 Relation with others 8 Enjoyment of life 8 What TIME of day is your pain at its worst? morning , daytime, evening, and night Sleep (in general) Fair  Pain is worse with: walking, bending, sitting, and inactivity Pain improves with: rest Relief from Meds:  na  walk without assistance how many minutes can you walk? 5 ability to climb steps?  no do you drive?  no  retired  No problems in this area  New pt  New  pt    Family History  Problem Relation Age of Onset   Heart disease Mother        heart attack   Stroke Father        brain hemorrhage   Diabetes Brother    Breast cancer Sister    Cancer Sister        breast cancer   Social History   Socioeconomic History   Marital status: Married    Spouse name: Not on file   Number of children: 1   Years of education: college   Highest education level: Not on file  Occupational History   Occupation: Homemaker  Tobacco Use   Smoking status: Never   Smokeless tobacco: Never  Vaping Use   Vaping Use: Never used  Substance and Sexual Activity   Alcohol use: No    Alcohol/week: 0.0 standard drinks of alcohol   Drug use: No   Sexual activity: Not Currently  Other Topics Concern   Not on file  Social History Narrative   Patient is married with one child.   Patient is right handed.   Patient has college education.   Patient drinks tea, two times a day   Social Determinants of Health   Financial Resource Strain: Not on file  Food Insecurity: Not on file  Transportation Needs: Not on file  Physical Activity: Not on file  Stress: Not on file  Social Connections: Not on file   Past Surgical History:  Procedure Laterality Date  ABDOMINAL HYSTERECTOMY     TAH   BREAST BIOPSY     Left breast biopsy benign lesion   BREAST BIOPSY  03/11/2012   Procedure: BREAST BIOPSY;  Surgeon: Odis Hollingshead, MD;  Location: [place];  Service: General;  Laterality: Left;  remove left breast mass   BREAST EXCISIONAL BIOPSY Left    CARDIAC CATHETERIZATION N/A 01/02/2015   Procedure: Left Heart Cath and Coronary Angiography;  Surgeon: Charolette Forward, MD;  Location: [facility];  Service: Cardiovascular;  Laterality: N/A;   CATARACT EXTRACTION     CERVICAL DISC SURGERY     COLONOSCOPY  2013   neg. next 2023.    EYE SURGERY     IR KYPHO EA ADDL LEVEL THORACIC OR LUMBAR  09/09/2019   IR KYPHO LUMBAR INC FX REDUCE BONE BX UNI/BIL CANNULATION  INC/IMAGING  09/09/2019   IR RADIOLOGIST EVAL & MGMT  09/08/2019   LEFT HIP SURGERY     TOTAL HIP ARTHROPLASTY Left 03/02/2016   Procedure: TOTAL HIP ARTHROPLASTY ANTERIOR APPROACH;  Surgeon: Rod Can, MD;  Location: [place];  Service: Orthopedics;  Laterality: Left;   Past Medical History:  Diagnosis Date   Anemia in chronic kidney disease 05/01/2015   Anxiety    Arthritis    Bladder irritation    Diabetes mellitus    GERD (gastroesophageal reflux disease)    Hypercholesterolemia    Hypertension    Hypothyroidism    Iron deficiency anemia, unspecified    Psoriasis    Transfusion history 03/05/2016   for postoperative (ORIF) anemia superimposed on chronic anemia   There were no vitals taken for this visit.  Opioid Risk Score:   Fall Risk Score:  `1  Depression screen [facility] 2/9     11/30/2021   10:47 AM  Depression screen PHQ 2/9  Decreased Interest 0  Down, Depressed, Hopeless 0  PHQ - 2 Score 0  Altered sleeping 0  Tired, decreased energy 0  Change in appetite 0  Feeling bad or failure about yourself  0  Trouble concentrating 0  Moving slowly or fidgety/restless 0  Suicidal thoughts 0  PHQ-9 Score 0  Difficult doing work/chores Not difficult at all     Review of Systems  Musculoskeletal:  Positive for back pain.  All other systems reviewed and are negative.      Objective:   Physical Exam Not performed as patient was seen via phone     Assessment & Plan:   1) Compression fracture: -referred to PT for core and paraspinal strengthening, HEP -Vitamin D level and parathyroid hormone levels ordered and discussed with patient PTH is elevated at 106, discussed that supplementing vitamin D can help to improve these levels, and recommended discussing with her endocrinologist -discussed foods that can increase bone strength and health -recommended cooking her chapatis in ghee or olive oil instead of canola oil -daughter would like her to take additional olive oil  as supplement for its anti-inflammatory pain benefits during the day as well: discussed Picual as a brand that is high in polyphenols, can be purchased online, recommended starting with 1 tsp per day and uptitrating to 2 TB per day for pain relief benefits -recommended 6 prunes per day for bone health and constipation -recommended grinding fresh turmeric and ginger roots into hot water for their anti-inflammatory benefits  2) Hip pain with history of left hip replacement -discussed extracoporeal showckwave therapy and she would like to try next visit. Messaged April to try to  set up weekly appointments for 3 sessions at this time.   3) CKD: -discussed that creatinine is 1.7 -recommended that she drink 6-8 glasses of water per day  4) Primary OA of bilateral hands -recommended applying blue emu oil on both hands.   23 minutes spent in discussion of turmeric, olive oil, and extracorporeal shockwave therapy for her pain, discussed using turmeric with black pepper and/or healthy fat to increase its absorption, discussed PTH/Vitamin D/creatinine levels, recommended increasing vitamin D supplement to alternating 5,000 and 10,000 units every other day, discussed choosing Picual olive oil brand

## 2021-12-07 ENCOUNTER — Telehealth: Payer: Self-pay | Admitting: Hematology and Oncology

## 2021-12-07 DIAGNOSIS — R071 Chest pain on breathing: Secondary | ICD-10-CM | POA: Insufficient documentation

## 2021-12-07 DIAGNOSIS — Z794 Long term (current) use of insulin: Secondary | ICD-10-CM | POA: Diagnosis not present

## 2021-12-07 DIAGNOSIS — N184 Chronic kidney disease, stage 4 (severe): Secondary | ICD-10-CM | POA: Diagnosis not present

## 2021-12-07 DIAGNOSIS — E1122 Type 2 diabetes mellitus with diabetic chronic kidney disease: Secondary | ICD-10-CM | POA: Diagnosis not present

## 2021-12-07 DIAGNOSIS — E11649 Type 2 diabetes mellitus with hypoglycemia without coma: Secondary | ICD-10-CM | POA: Diagnosis not present

## 2021-12-07 NOTE — Telephone Encounter (Signed)
Contacted patient to scheduled appointments. Left message with appointment details and a call back number if patient had any questions or could not accommodate the time we provided.   

## 2021-12-11 ENCOUNTER — Other Ambulatory Visit (HOSPITAL_COMMUNITY): Payer: Self-pay

## 2021-12-13 ENCOUNTER — Telehealth: Payer: Self-pay

## 2021-12-13 NOTE — Telephone Encounter (Signed)
Patient's daughter called and wanted to know if it would be a contraindication for the patient to get the Shockwave therapy since she is getting a continuous blood glucose monitor.  Also she stated the patient is on 50,000 units of Vitamin D and she wants to verify that she is supposed to alternate between 5,000 units and 10,000 units in between. Please advise

## 2021-12-15 ENCOUNTER — Other Ambulatory Visit: Payer: Self-pay | Admitting: Physician Assistant

## 2021-12-17 ENCOUNTER — Encounter: Payer: Medicare Other | Admitting: Physical Medicine and Rehabilitation

## 2021-12-17 DIAGNOSIS — M4856XA Collapsed vertebra, not elsewhere classified, lumbar region, initial encounter for fracture: Secondary | ICD-10-CM

## 2021-12-17 NOTE — Progress Notes (Signed)
Left voicemail for patient to discuss her questions

## 2021-12-19 NOTE — Therapy (Signed)
OUTPATIENT PHYSICAL THERAPY THORACOLUMBAR EVALUATION   Patient Name: Abigail Hoover MRN: [id number] DOB:1946-01-06, 76 y.o., female Today's Date: 12/20/2021   PT End of Session - 12/20/21 1145     Visit Number 1    Number of Visits 7    Date for PT Re-Evaluation 02/02/22    Authorization Type BCBS MCR    Progress Note Due on Visit 10    PT Start Time 1145    PT Stop Time 1227    PT Time Calculation (min) 42 min    Activity Tolerance Patient tolerated treatment well    Behavior During Therapy [facility] for tasks assessed/performed             Past Medical History:  Diagnosis Date   Anemia in chronic kidney disease 05/01/2015   Anxiety    Arthritis    Bladder irritation    Diabetes mellitus    GERD (gastroesophageal reflux disease)    Hypercholesterolemia    Hypertension    Hypothyroidism    Iron deficiency anemia, unspecified    Psoriasis    Transfusion history 03/05/2016   for postoperative (ORIF) anemia superimposed on chronic anemia   Past Surgical History:  Procedure Laterality Date   ABDOMINAL HYSTERECTOMY     TAH   BREAST BIOPSY     Left breast biopsy benign lesion   BREAST BIOPSY  03/11/2012   Procedure: BREAST BIOPSY;  Surgeon: Odis Hollingshead, MD;  Location: [place];  Service: General;  Laterality: Left;  remove left breast mass   BREAST EXCISIONAL BIOPSY Left    CARDIAC CATHETERIZATION N/A 01/02/2015   Procedure: Left Heart Cath and Coronary Angiography;  Surgeon: Charolette Forward, MD;  Location: [place] [facility];  Service: Cardiovascular;  Laterality: N/A;   CATARACT EXTRACTION     CERVICAL DISC SURGERY     COLONOSCOPY  2013   neg. next 2023.    EYE SURGERY     IR KYPHO EA ADDL LEVEL THORACIC OR LUMBAR  09/09/2019   IR KYPHO LUMBAR INC FX REDUCE BONE BX UNI/BIL CANNULATION INC/IMAGING  09/09/2019   IR RADIOLOGIST EVAL & MGMT  09/08/2019   LEFT HIP SURGERY     TOTAL HIP ARTHROPLASTY Left 03/02/2016   Procedure: TOTAL HIP ARTHROPLASTY ANTERIOR APPROACH;   Surgeon: Rod Can, MD;  Location: [place];  Service: Orthopedics;  Laterality: Left;   Patient Active Problem List   Diagnosis Date Noted   DDD (degenerative disc disease), lumbar 08/26/2019   Age-related osteoporosis without current pathological fracture 08/26/2019   Primary osteoarthritis of both knees 08/26/2019   Primary osteoarthritis of both hands 08/26/2019   Diabetes mellitus with chronic kidney disease, with long-term current use of insulin ([place]) 05/11/2019   Pain due to onychomycosis of toenails of both feet 09/30/2018   Trigger thumb of right hand 09/02/2018   Chronic pain of right thumb 08/27/2018   Plantar fasciitis of left foot 05/19/2018   Lower limb pain, inferior, left 04/28/2018   Dry skin 03/12/2018   Neck pain 01/27/2018   Other fatigue 09/18/2017   Pain and swelling of right wrist 08/06/2017   At high risk for breast cancer 03/06/2017   Hypercalcemia 12/16/2016   Vitamin B 12 deficiency 12/16/2016   Displaced fracture of left femoral neck ([place]) 03/02/2016   Hyperkalemia 03/01/2016   Femoral neck fracture ([place]) 02/29/2016   Anemia due to stage 3 (moderate) chronic renal failure treated with erythropoietin ([place]) 12/18/2015   Chronic kidney disease, stage III (moderate) ([place]) 08/14/2015  Anemia in chronic kidney disease 05/01/2015   Acute coronary syndrome ([place]) 01/02/2015   Lumbar spondylosis with myelopathy 06/27/2014   Deficiency anemia 05/27/2014   Carpal tunnel syndrome 12/09/2013   Diabetic neuropathy ([place]) 12/09/2013   Type 2 diabetes mellitus with stage 4 chronic kidney disease, with long-term current use of insulin ([place]) 04/21/2013   Disturbance of skin sensation 04/21/2013   Hypothyroidism 10/30/2012   Breast mass in female-left 03/10/2012   Weight loss 11/28/2011   Increased frequency of urination 10/16/2011   Urinary urgency 10/16/2011   Family history of breast cancer 08/01/2010   Essential hypertension 02/01/2009   EDEMA LEGS 02/01/2009     PCP: [name], MD  REFERRING PROVIDER: Izora Ribas, MD  REFERRING DIAG: [phone] (ICD-10-CM) - Nontraumatic compression fracture of L5 vertebra, initial encounter [facility])  Rationale for Evaluation and Treatment Rehabilitation  THERAPY DIAG:  Other low back pain  Muscle weakness (generalized)  Abnormal posture  Other abnormalities of gait and mobility  ONSET DATE: fall in June 2021   SUBJECTIVE:                                                                                                                                                                                          Daughter interprets  SUBJECTIVE STATEMENT: Daughter reports patient has severe pain in her back. Daughter reports she had a fall in 2010 that resulted in plates/screws in her cervical spine, but unable to provide further info on this. Daughter reports she fell in 2017 that resulted in a Lt hip replacement. Daughter reports her back pain began to severely worsen in 2021, which led to them to seek medical care and was diagnosed with a compression fracture. Daughter reports she has done some physical therapy in the past, but this did not help. She denies any numbness/tingling. No changes in bowel/bladder as it relates to her back pain. She does wear a back brace at home.   PERTINENT HISTORY:  Lt THA 2017 L4-5 compression fracture 2021  CKD Diabetes   See significant PMH above   PAIN:  Are you having pain? Yes: NPRS scale: 8/10 Pain location: low back Pain description: unable to describe Aggravating factors: bending, transfers, standing Relieving factors: sit   PRECAUTIONS: Fall  WEIGHT BEARING RESTRICTIONS No  FALLS:  Has patient fallen in last 6 months? No  LIVING ENVIRONMENT: Lives with: lives with their family Lives in: House/apartment Stairs: Yes: External: 3 steps; none Has following equipment at home: None  OCCUPATION: retired   PLOF:  Modified Independent with  dressing  PATIENT GOALS pain relief    OBJECTIVE:   DIAGNOSTIC FINDINGS:  X-ray Lumbar June 2021 IMPRESSION: 1. Acute to subacute L4 inferior endplate and L5 superior endplate compression fractures 2. Subacute fracture of the right L5 pedicle.    PATIENT SURVEYS:  N/A due to language   SCREENING FOR RED FLAGS: Bowel or bladder incontinence: No Spinal tumors: No Cauda equina syndrome: No Compression fracture: No Abdominal aneurysm: No  COGNITION:  Overall cognitive status: Within functional limits for tasks assessed     SENSATION: Not tested  POSTURE: rounded shoulders, forward head, decreased lumbar lordosis, increased thoracic kyphosis, and flexed trunk   PALPATION: N/A  LUMBAR ROM:   Active  A/PROM  eval  Flexion 50% limited  Extension 75% limited  Right lateral flexion 75% limited  Left lateral flexion 75% limited  Right rotation 75% limited  Left rotation 75% limited   (Blank rows = not tested) Pain with all lumbar AROM   LOWER EXTREMITY MMT:    MMT Right eval Left eval  Hip flexion 3+ 3+  Hip extension    Hip abduction    Hip adduction    Hip internal rotation    Hip external rotation    Knee flexion 4 4  Knee extension 5 5  Ankle dorsiflexion    Ankle plantarflexion    Ankle inversion    Ankle eversion     (Blank rows = not tested) Unable to lay prone or supine   LUMBAR SPECIAL TESTS:  N/A   FUNCTIONAL TESTS:  30 seconds chair stand test 6 reps with BUE  TUG 17.7 seconds   GAIT: Distance walked: 10 ft Assistive device utilized: None Level of assistance: Complete Independence Comments: forward flexed posture, decreased step length, limited push-off    TODAY'S TREATMENT  [facility] Adult PT Treatment:                                                DATE: 12/20/21 Therapeutic Exercise: Demonstrated and issued initial HEP.  Pelvic tilts seated; attempted unable to coordinate proper movement    Therapeutic Activity: Education on  assessment findings that will be addressed throughout duration of POC.    Self Care: Posture     PATIENT EDUCATION:  Education details: see treatment  Person educated: Patient and Child(ren) Education method: Explanation, Demonstration, Tactile cues, Verbal cues, and Handouts Education comprehension: verbalized understanding, returned demonstration, verbal cues required, tactile cues required, and needs further education   HOME EXERCISE PROGRAM: Access Code: [email or web address] URL: [email or web address] Date: 12/20/2021 Prepared by: Gwendolyn Grant  Exercises - Seated Scapular Retraction  - 2 x daily - 7 x weekly - 2 sets - 10 reps - Seated March  - 2 x daily - 7 x weekly - 2 sets - 10 reps - Seated Hip Abduction with Resistance  - 2 x daily - 7 x weekly - 2 sets - 10 reps - Seated Long Arc Quad  - 2 x daily - 7 x weekly - 2 sets - 10 reps  ASSESSMENT:  CLINICAL IMPRESSION: Patient is a 76 y.o. female who was seen today for physical therapy evaluation and treatment for chronic low back pain that has been ongoing since sustaining lumbar compression fracture in 2021. She reports high pain levels upon arrival, but is in NAD throughout evaluation. She is noted to have severely limited lumbar AROM, gross BLE and core weakness, postural abnormalities, and activity limitations secondary to pain. She  will benefit from skilled PT to address the above stated deficits in order to optimize her function.    OBJECTIVE IMPAIRMENTS Abnormal gait, decreased activity tolerance, decreased knowledge of condition, decreased mobility, difficulty walking, decreased ROM, decreased strength, improper body mechanics, postural dysfunction, and pain.   ACTIVITY LIMITATIONS carrying, lifting, bending, standing, squatting, stairs, transfers, dressing, and locomotion level  PARTICIPATION LIMITATIONS: meal prep, cleaning, laundry, shopping, and community activity  PERSONAL FACTORS Age, Fitness, Past/current  experiences, Time since onset of injury/illness/exacerbation, and 3+ comorbidities: see PMH above  are also affecting patient's functional outcome.   REHAB POTENTIAL: Fair chronic pain; previously completed PT without relief  CLINICAL DECISION MAKING: Evolving/moderate complexity  EVALUATION COMPLEXITY: Moderate   GOALS: Goals reviewed with patient? Yes  SHORT TERM GOALS:=LTG    LONG TERM GOALS: Target date: 01/31/2022  Patient will complete at least 10 reps during 30 second chair test to signify improvement in functional strength.  Baseline: 6 Goal status: INITIAL  2.  Patient will demonstrate at least 4/5 bilateral hip flexor strength to improve stability with walking.  Baseline: see above  Goal status: INITIAL  3.  Patient will complete TUG in </=13 seconds to reduce risk of falls.  Baseline: see above  Goal status: INITIAL  4.  Patient will rate back pain as </=6/10 to reduce her current functional limitations.  Baseline: 8/10 at arrival with patient/daughter reporting worsening during evaluation.  Goal status: INITIAL  5.  Patient will be independent with advanced home program to assist in management of her chronic condition.  Baseline: initial HEP issued  Goal status: INITIAL   PLAN: PT FREQUENCY: 1x/week  PT DURATION: 6 weeks  PLANNED INTERVENTIONS: Therapeutic exercises, Therapeutic activity, Neuromuscular re-education, Balance training, Gait training, Patient/Family education, Self Care, DME instructions, Dry Needling, Cryotherapy, Moist heat, Manual therapy, and Re-evaluation.  PLAN FOR NEXT SESSION: review HEP, core strengthening, posture education, hip strengthening, lumbar mobility   Gwendolyn Grant, PT, DPT, ATC 12/20/21 1:47 PM

## 2021-12-20 ENCOUNTER — Ambulatory Visit: Payer: Medicare Other | Attending: Physical Medicine and Rehabilitation

## 2021-12-20 DIAGNOSIS — M5459 Other low back pain: Secondary | ICD-10-CM | POA: Diagnosis not present

## 2021-12-20 DIAGNOSIS — R2689 Other abnormalities of gait and mobility: Secondary | ICD-10-CM | POA: Diagnosis not present

## 2021-12-20 DIAGNOSIS — M4856XA Collapsed vertebra, not elsewhere classified, lumbar region, initial encounter for fracture: Secondary | ICD-10-CM | POA: Diagnosis not present

## 2021-12-20 DIAGNOSIS — R293 Abnormal posture: Secondary | ICD-10-CM | POA: Insufficient documentation

## 2021-12-20 DIAGNOSIS — M6281 Muscle weakness (generalized): Secondary | ICD-10-CM | POA: Diagnosis not present

## 2021-12-21 ENCOUNTER — Encounter: Payer: Medicare Other | Admitting: Physical Medicine and Rehabilitation

## 2021-12-21 DIAGNOSIS — M4856XA Collapsed vertebra, not elsewhere classified, lumbar region, initial encounter for fracture: Secondary | ICD-10-CM

## 2021-12-21 NOTE — Progress Notes (Signed)
Spoke with daughter and she needs more time to write down her questions and will call me on Monday

## 2021-12-26 ENCOUNTER — Encounter: Payer: Self-pay | Admitting: Pharmacist

## 2021-12-26 ENCOUNTER — Telehealth: Payer: Self-pay | Admitting: Pharmacist

## 2021-12-26 ENCOUNTER — Other Ambulatory Visit (HOSPITAL_COMMUNITY): Payer: Self-pay

## 2021-12-26 DIAGNOSIS — M8000XS Age-related osteoporosis with current pathological fracture, unspecified site, sequela: Secondary | ICD-10-CM

## 2021-12-26 MED ORDER — DENOSUMAB 60 MG/ML ~~LOC~~ SOSY
60.0000 mg | PREFILLED_SYRINGE | Freq: Once | SUBCUTANEOUS | 0 refills | Status: DC
  Filled 2021-12-26: qty 1, 180d supply, fill #0

## 2021-12-26 NOTE — Telephone Encounter (Signed)
Patient due for Prolia on 01/01/22. Per test claim , copay through pharmacy benefit is $77.98.  BMP on 11/30/21 - calcium wnl. Vitamin D on 11/30/21 wnl  Patient scheduled for 01/02/22. Rx sent to [facility] to be couriered to clinic prior to apt. Patient aware of and comfortable with copay.  Knox Saliva, PharmD, MPH, BCPS, CPP Clinical Pharmacist (Rheumatology and Pulmonology)

## 2021-12-26 NOTE — Progress Notes (Signed)
Pharmacy Note  Subjective:   Patient presents to clinic today to receive bi-annual dose of Prolia. Patient's started Prolia on 07/05/21. She reports pain in left leg but states that she is still pain management and has f/u on 01/10/22  Patient running a fever or have signs/symptoms of infection? No  Patient currently on antibiotics for the treatment of infection? No  Patient had fall in the last 6 months?  No    Patient taking calcium 1200 mg daily through diet or supplement and at least 800 units vitamin D? Yes  Objective: CMP     Component Value Date/Time   NA 143 11/30/2021 1218   NA 138 12/12/2016 1150   K 4.4 11/30/2021 1218   K 4.6 12/12/2016 1150   CL 99 11/30/2021 1218   CL 103 07/27/2012 1259   CO2 26 11/30/2021 1218   CO2 31 (H) 12/12/2016 1150   GLUCOSE 148 (H) 11/30/2021 1218   GLUCOSE 337 (H) 08/14/2021 1043   GLUCOSE 149 (H) 12/12/2016 1150   GLUCOSE 118 (H) 07/27/2012 1259   BUN 36 (H) 11/30/2021 1218   BUN 23.4 12/12/2016 1150   CREATININE 1.70 (H) 11/30/2021 1218   CREATININE 2.07 (H) 08/14/2021 1043   CREATININE 1.99 (H) 07/18/2021 1032   CREATININE 1.7 (H) 12/12/2016 1150   CALCIUM 10.1 11/30/2021 1218   CALCIUM 11.4 (H) 12/12/2016 1150   PROT 6.0 (L) 07/18/2021 1032   PROT 6.5 12/12/2016 1150   ALBUMIN 4.0 04/29/2019 1451   ALBUMIN 3.9 12/12/2016 1150   AST 20 07/18/2021 1032   AST 19 12/12/2016 1150   ALT 14 07/18/2021 1032   ALT 10 12/12/2016 1150   ALKPHOS 68 04/29/2019 1451   ALKPHOS 65 12/12/2016 1150   BILITOT 0.5 07/18/2021 1032   BILITOT 0.36 12/12/2016 1150   GFRNONAA 24 (L) 08/14/2021 1043   GFRNONAA 21 (L) 09/20/2019 1506   GFRAA 25 (L) 09/20/2019 1506    CBC    Component Value Date/Time   WBC 5.1 08/14/2021 1043   RBC 3.93 08/14/2021 1043   RBC 3.95 08/14/2021 1043   HGB 10.8 (L) 08/14/2021 1043   HGB 10.1 (L) 02/05/2018 1123   HGB 10.9 (L) 03/21/2017 1221   HCT 34.6 (L) 08/14/2021 1043   HCT 31.9 (L) 01/05/2019 1340    HCT 35.9 03/21/2017 1221   PLT 169 08/14/2021 1043   PLT 200 02/05/2018 1123   PLT 288 03/21/2017 1221   MCV 87.6 08/14/2021 1043   MCV 82.5 03/21/2017 1221   MCH 27.3 08/14/2021 1043   MCHC 31.2 08/14/2021 1043   RDW 14.6 08/14/2021 1043   RDW 15.7 (H) 03/21/2017 1221   LYMPHSABS 1.4 08/14/2021 1043   LYMPHSABS 1.1 03/21/2017 1221   MONOABS 0.3 08/14/2021 1043   MONOABS 0.3 03/21/2017 1221   EOSABS 0.2 08/14/2021 1043   EOSABS 0.3 03/21/2017 1221   BASOSABS 0.0 08/14/2021 1043   BASOSABS 0.0 03/21/2017 1221    Lab Results  Component Value Date   VD25OH 30.2 11/30/2021    Date of Scan: 09/22/2019 Lowest T-score and site measured: -2.2 Right Femoral Neck. started on Forteo on September 29, 2019  Assessment/Plan:   Reviewed importance of adequate dietary intake of calcium in addition to supplementation due to risk of hypocalcemia with Prolia.   Patient tolerated injection without issue.   Administrations This Visit     denosumab (PROLIA) injection 60 mg     Admin Date 01/02/2022 Action Given Dose 60 mg Route Subcutaneous Administered By  Cassandria Anger, RPH-CPP           Patient's next Prolia dose is due on 07/01/2022.  Patient is overdue for updated DEXA in June 2023. DEXA order placed today for [facility] where she had completed in 2021.   All questions encouraged and answered.  Instructed patient to call with any further questions or concerns.  Knox Saliva, PharmD, MPH, BCPS, CPP Clinical Pharmacist (Rheumatology and Pulmonology)

## 2021-12-27 ENCOUNTER — Ambulatory Visit: Payer: Medicare Other

## 2021-12-28 ENCOUNTER — Other Ambulatory Visit (HOSPITAL_COMMUNITY): Payer: Self-pay

## 2021-12-31 ENCOUNTER — Other Ambulatory Visit (HOSPITAL_COMMUNITY): Payer: Self-pay

## 2022-01-01 NOTE — Telephone Encounter (Signed)
Prolia received from [facility]. Placed in refrigerator  Breckin Zafar, PharmD, MPH, BCPS, CPP Clinical Pharmacist (Rheumatology and Pulmonology) 

## 2022-01-02 ENCOUNTER — Ambulatory Visit: Payer: Medicare Other | Attending: Rheumatology | Admitting: Pharmacist

## 2022-01-02 VITALS — BP 154/74 | HR 61

## 2022-01-02 DIAGNOSIS — M8000XS Age-related osteoporosis with current pathological fracture, unspecified site, sequela: Secondary | ICD-10-CM | POA: Diagnosis not present

## 2022-01-02 DIAGNOSIS — Z7689 Persons encountering health services in other specified circumstances: Secondary | ICD-10-CM

## 2022-01-02 MED ORDER — DENOSUMAB 60 MG/ML ~~LOC~~ SOSY
60.0000 mg | PREFILLED_SYRINGE | Freq: Once | SUBCUTANEOUS | Status: AC
  Administered 2022-01-02: 60 mg via SUBCUTANEOUS

## 2022-01-10 ENCOUNTER — Encounter
Payer: Medicare Other | Attending: Physical Medicine and Rehabilitation | Admitting: Physical Medicine and Rehabilitation

## 2022-01-10 DIAGNOSIS — M25552 Pain in left hip: Secondary | ICD-10-CM | POA: Insufficient documentation

## 2022-01-10 NOTE — Progress Notes (Signed)
5-'10Hz'$  Power 60-90 Shocks 2000 1st session today  The risks and benefits of extracoporeal shock wave therapy were discussed with patient and consent form was signed.   The appropriate setting of "greater trochanteric bursitis" was selected and the shock wave therapy was administered to the patient's left hip until a current of 2,000 was achieved.  The patient tolerated the procedure well without side effects.  Post-procedure instructions were given.

## 2022-01-14 ENCOUNTER — Ambulatory Visit: Payer: Medicare Other | Admitting: Cardiology

## 2022-01-14 ENCOUNTER — Ambulatory Visit: Payer: Medicare Other

## 2022-01-14 ENCOUNTER — Other Ambulatory Visit: Payer: Medicare Other

## 2022-01-14 ENCOUNTER — Ambulatory Visit: Payer: Medicare Other | Admitting: Hematology and Oncology

## 2022-01-16 ENCOUNTER — Encounter: Payer: Self-pay | Admitting: Cardiology

## 2022-01-16 ENCOUNTER — Encounter: Payer: Self-pay | Admitting: Hematology and Oncology

## 2022-01-16 ENCOUNTER — Ambulatory Visit: Payer: Medicare Other | Admitting: Cardiology

## 2022-01-16 VITALS — BP 128/47 | HR 55 | Ht 60.0 in | Wt 127.6 lb

## 2022-01-16 DIAGNOSIS — I2089 Other forms of angina pectoris: Secondary | ICD-10-CM | POA: Diagnosis not present

## 2022-01-16 DIAGNOSIS — R072 Precordial pain: Secondary | ICD-10-CM | POA: Diagnosis not present

## 2022-01-16 NOTE — Progress Notes (Signed)
Patient referred by [name], MD for chest pain  Subjective:   Abigail Hoover, female    DOB: 05/13/1945, 76 y.o.   MRN: [id number]   Chief Complaint  Patient presents with   Chest Pain   New Patient (Initial Visit)          HPI  76 year old [place] female with hypertension, type 2 diabetes mellitus, hypothyroidism, CKD stage 3b, referred for chest pain  Patient reports episodes of retrosternal chest pain that is particularly brought upon by emotional stress (when she cries thinking about her deceased husband who passed in early 2023). These episodes last for a few minutes and subside on their own. She does not have any exertional chest pain. That said, her physical activity is limited to walking around the house. Physical activity is significantly limited due to left hip pain. She did report that she does not walk treadmill because it causes her to have chest pain.   Patient  is currently taking metoprolol succinate and Imdur. She previously saw Dr. Terrence Dupont in 2016 andr Ganji in 2017. Prior workup showed normal EF, no ischemia on stress testing, cardiac catheterization with mild (10-20%) nonobstructive disease.    Past Medical History:  Diagnosis Date   Anemia in chronic kidney disease 05/01/2015   Anxiety    Arthritis    Bladder irritation    Diabetes mellitus    GERD (gastroesophageal reflux disease)    Hypercholesterolemia    Hypertension    Hypothyroidism    Iron deficiency anemia, unspecified    Psoriasis    Transfusion history 03/05/2016   for postoperative (ORIF) anemia superimposed on chronic anemia     Past Surgical History:  Procedure Laterality Date   ABDOMINAL HYSTERECTOMY     TAH   BREAST BIOPSY     Left breast biopsy benign lesion   BREAST BIOPSY  03/11/2012   Procedure: BREAST BIOPSY;  Surgeon: Odis Hollingshead, MD;  Location: [facility];  Service: General;  Laterality: Left;  remove left breast mass   BREAST EXCISIONAL BIOPSY Left     CARDIAC CATHETERIZATION N/A 01/02/2015   Procedure: Left Heart Cath and Coronary Angiography;  Surgeon: Charolette Forward, MD;  Location: [facility];  Service: Cardiovascular;  Laterality: N/A;   CATARACT EXTRACTION     CERVICAL DISC SURGERY     COLONOSCOPY  2013   neg. next 2023.    EYE SURGERY     IR KYPHO EA ADDL LEVEL THORACIC OR LUMBAR  09/09/2019   IR KYPHO LUMBAR INC FX REDUCE BONE BX UNI/BIL CANNULATION INC/IMAGING  09/09/2019   IR RADIOLOGIST EVAL & MGMT  09/08/2019   LEFT HIP SURGERY     TOTAL HIP ARTHROPLASTY Left 03/02/2016   Procedure: TOTAL HIP ARTHROPLASTY ANTERIOR APPROACH;  Surgeon: Rod Can, MD;  Location: [facility];  Service: Orthopedics;  Laterality: Left;     Social History   Tobacco Use  Smoking Status Never  Smokeless Tobacco Never    Social History   Substance and Sexual Activity  Alcohol Use No   Alcohol/week: 0.0 standard drinks of alcohol     Family History  Problem Relation Age of Onset   Heart disease Mother        heart attack   Stroke Father        brain hemorrhage   Diabetes Brother    Breast cancer Sister    Cancer Sister        breast cancer  Current Outpatient Medications:    aspirin EC 81 MG tablet, Take 81 mg by mouth daily. Take 1 tablet by mouth once daily., Disp: , Rfl:    betamethasone dipropionate 0.05 % cream, Apply 1 application topically 2 (two) times daily., Disp: , Rfl:    CARAFATE 1 GM/10ML suspension, TAKE 10ML BY MOUTH 4 (FOUR) TIMES A DAY BETWEEN MEALS AND AT BEDTIME, Disp: , Rfl:    colchicine 0.6 MG tablet, TAKE ONE-HALF TABLET (0.3 MG TOTAL) BY MOUTH DAILY., Disp: 15 tablet, Rfl: 2   gabapentin (NEURONTIN) 300 MG capsule, Take 300 mg by mouth 3 (three) times daily., Disp: , Rfl:    isosorbide mononitrate (IMDUR) 60 MG 24 hr tablet, 120 mg daily., Disp: , Rfl:    Lancet Devices (ONE TOUCH DELICA LANCING DEV) MISC, 1 each by Does not apply route daily. Use as instructed to check blood sugar once daily.,  Disp: 1 each, Rfl: 0   Lancets (ONETOUCH DELICA PLUS [id number]) MISC, TEST 3 TIMES DAILY, Disp: 100 each, Rfl: 2   metoprolol succinate (TOPROL-XL) 50 MG 24 hr tablet, Take 1 tablet (50 mg total) by mouth daily. Take with or immediately following a meal., Disp: 30 tablet, Rfl: 3   mirabegron ER (MYRBETRIQ) 50 MG TB24 tablet, Take by mouth., Disp: , Rfl:    NOVOLOG FLEXPEN 100 UNIT/ML FlexPen, INJECT 9-11 UNITS UNDER THE SKIN BEFORE the 3 meals, Disp: 30 mL, Rfl: 3   pentosan polysulfate (ELMIRON) 100 MG capsule, TAKE 1 CAPSULE (100 MG TOTAL) BY MOUTH 3 TIMES DAILY BEFORE MEALS., Disp: , Rfl:    pravastatin (PRAVACHOL) 20 MG tablet, TAKE ONE TABLET BY MOUTH ONCE DAILY, Disp: 15 tablet, Rfl: 0   sitaGLIPtin (JANUVIA) 25 MG tablet, Take 25 mg by mouth daily., Disp: , Rfl:    SYNTHROID 75 MCG tablet, TAKE ONE TABLET BY MOUTH ONCE DAILY, Disp: 90 tablet, Rfl: 1   torsemide (DEMADEX) 20 MG tablet, Take 40 mg by mouth daily., Disp: , Rfl:    triamcinolone (KENALOG) 0.025 % ointment, Apply 1 application topically 2 (two) times daily., Disp: , Rfl:    Vitamin D, Ergocalciferol, (DRISDOL) 1.25 MG (50000 UNIT) CAPS capsule, Take 1 capsule (50,000 Units total) by mouth every 7 (seven) days., Disp: 20 capsule, Rfl: 0   denosumab (PROLIA) 60 MG/ML SOSY injection, Inject 60 mg into the skin once for 1 dose. Courier to rheum: [address], [place], [place] [address]. Appt on 01/02/22, Disp: 1 mL, Rfl: 0   glucose blood (ONETOUCH VERIO) test strip, Use Onetouch verio test strips to check blood sugar three times daily. (90 day RX), Disp: 300 each, Rfl: 2   Insulin Pen Needle (PEN NEEDLES 3/16") 31G X 5 MM MISC, Use 1 pen needle to inject Forteo daily., Disp: 100 each, Rfl: 2   Insulin Pen Needle 31G X 5 MM MISC, Use with Novolog, Disp: 100 each, Rfl: 1   Insulin Syringe-Needle U-100 25G X 5/8" 1 ML MISC, USE DAILY TO INJECT INSULIN, Disp: 100 each, Rfl: 3   Cardiovascular and other pertinent  studies:  Reviewed external labs and tests, independently interpreted  EKG 01/16/2022: Probable sinus rhythm 55 bpm Baseline artifact   Recent labs: 11/30/2021: Glucose 148, BUN/Cr 36/1.70. EGFR 31. Na/K 143/4.4.  H/H 10.8/24.6. MCV 87. Platelets 169 (07/2021) HbA1C 7.0%   Review of Systems  Cardiovascular:  Positive for chest pain and leg swelling. Negative for dyspnea on exertion, palpitations and syncope.  Musculoskeletal:  Positive for joint pain.  Vitals:   01/16/22 1403  BP: (!) 128/47  Pulse: (!) 55  SpO2: 100%     Body mass index is 24.92 kg/m. Filed Weights   01/16/22 1403  Weight: 127 lb 9.6 oz (57.9 kg)     Objective:   Physical Exam Vitals and nursing note reviewed.  Constitutional:      General: She is not in acute distress. Neck:     Vascular: No JVD.  Cardiovascular:     Rate and Rhythm: Normal rate and regular rhythm.     Heart sounds: Normal heart sounds. No murmur heard. Pulmonary:     Effort: Pulmonary effort is normal.     Breath sounds: Normal breath sounds. No wheezing or rales.  Musculoskeletal:     Right lower leg: Edema (1+) present.     Left lower leg: Edema (1+) present.          Visit diagnoses:   ICD-10-CM   1. Precordial pain  R07.2 EKG 12-Lead    2. Stable angina  I20.89 PCV MYOCARDIAL PERFUSION WITH LEXISCAN    PCV ECHOCARDIOGRAM COMPLETE    Lipid panel    CBC    Basic metabolic panel       Orders Placed This Encounter  Procedures   Lipid panel   CBC   Basic metabolic panel   PCV MYOCARDIAL PERFUSION WITH LEXISCAN   EKG 12-Lead   PCV ECHOCARDIOGRAM COMPLETE     Assessment & Recommendations:   76 year old [place] female with hypertension, type 2 diabetes mellitus, hypothyroidism, CKD stage 3b, referred for chest pain  Chest pain: Concerning for angina. Currently on well tolerated ant anginal therapy. Recommend echocardiogram, lexican nuclear stress test (unable to walk on treadmill due  to hip pain). Continue Aspirin, stain. Check lipid panel. May upscale lipid lowering therapy based on that. Use SL NTG as needed for chest pain not relieved by rest (physical or emotional, in her case).  Given that symptoms are not adequately controlled with medical therapy, should have any significant ischemia, next step would be cardiac catheterization. Her risks of CIN are elevated owing to GFR iif 31, but this is not prohibitive. She will need additional hydration to reduce CIN risk, if cardiac catheterization is indeed performed.  Continue torsemide 20 mg daily for now. Based on echocardiogram, will adjust further therapy. Check CBC, BMP, lipid panel.   On a separate note, she has had wide pulse pressure now and in the past, without any obvious AI murmur today, no AI on echocardiogram in 2016.   Further recommendations after above testing.  Thank you for referring the patient to [place]. Please feel free to contact with any questions.   Nigel Mormon, MD Pager: [phone] Office: [phone]

## 2022-01-17 ENCOUNTER — Other Ambulatory Visit: Payer: Medicare Other

## 2022-01-17 ENCOUNTER — Ambulatory Visit: Payer: Medicare Other

## 2022-01-17 ENCOUNTER — Ambulatory Visit: Payer: Medicare Other | Admitting: Hematology and Oncology

## 2022-01-18 DIAGNOSIS — K5904 Chronic idiopathic constipation: Secondary | ICD-10-CM | POA: Diagnosis not present

## 2022-01-18 DIAGNOSIS — E039 Hypothyroidism, unspecified: Secondary | ICD-10-CM | POA: Diagnosis not present

## 2022-01-18 DIAGNOSIS — I2089 Other forms of angina pectoris: Secondary | ICD-10-CM | POA: Diagnosis not present

## 2022-01-19 LAB — BASIC METABOLIC PANEL
BUN/Creatinine Ratio: 17 (ref 12–28)
BUN: 28 mg/dL — ABNORMAL HIGH (ref 8–27)
CO2: 21 mmol/L (ref 20–29)
Calcium: 8.8 mg/dL (ref 8.7–10.3)
Chloride: 101 mmol/L (ref 96–106)
Creatinine, Ser: 1.61 mg/dL — ABNORMAL HIGH (ref 0.57–1.00)
Glucose: 223 mg/dL — ABNORMAL HIGH (ref 70–99)
Potassium: 4.7 mmol/L (ref 3.5–5.2)
Sodium: 140 mmol/L (ref 134–144)
eGFR: 33 mL/min/{1.73_m2} — ABNORMAL LOW (ref >59)

## 2022-01-19 LAB — LIPID PANEL
Chol/HDL Ratio: 3.1 ratio (ref 0.0–4.4)
Cholesterol, Total: 150 mg/dL (ref 100–199)
HDL: 49 mg/dL (ref >39)
LDL Chol Calc (NIH): 66 mg/dL (ref 0–99)
Triglycerides: 212 mg/dL — ABNORMAL HIGH (ref 0–149)
VLDL Cholesterol Cal: 35 mg/dL (ref 5–40)

## 2022-01-19 LAB — CBC
Hematocrit: 29.4 % — ABNORMAL LOW (ref 34.0–46.6)
Hemoglobin: 9 g/dL — ABNORMAL LOW (ref 11.1–15.9)
MCH: 27.4 pg (ref 26.6–33.0)
MCHC: 30.6 g/dL — ABNORMAL LOW (ref 31.5–35.7)
MCV: 90 fL (ref 79–97)
Platelets: 164 10*3/uL (ref 150–450)
RBC: 3.28 x10E6/uL — ABNORMAL LOW (ref 3.77–5.28)
RDW: 13.1 % (ref 11.7–15.4)
WBC: 6.2 10*3/uL (ref 3.4–10.8)

## 2022-01-21 ENCOUNTER — Encounter: Payer: Self-pay | Admitting: Hematology and Oncology

## 2022-01-21 ENCOUNTER — Ambulatory Visit: Payer: Medicare Other

## 2022-01-21 ENCOUNTER — Ambulatory Visit: Payer: Medicare Other | Admitting: Podiatry

## 2022-01-21 DIAGNOSIS — I2089 Other forms of angina pectoris: Secondary | ICD-10-CM

## 2022-01-24 ENCOUNTER — Encounter
Payer: Medicare Other | Attending: Physical Medicine and Rehabilitation | Admitting: Physical Medicine and Rehabilitation

## 2022-01-24 VITALS — BP 146/62 | HR 62 | Ht 60.0 in | Wt 131.6 lb

## 2022-01-24 DIAGNOSIS — M25552 Pain in left hip: Secondary | ICD-10-CM | POA: Insufficient documentation

## 2022-01-24 NOTE — Progress Notes (Signed)
5-'10Hz'$  Power 60-90 Shocks 2000 2nd session today  The risks and benefits of extracoporeal shock wave therapy were discussed with patient and consent form was signed.   The appropriate setting of "greater trochanteric bursitis" was selected and the shock wave therapy was administered to the patient's left hip until a current of 2,000 was achieved.  The patient tolerated the procedure well without side effects.  Post-procedure instructions were given.

## 2022-01-29 ENCOUNTER — Ambulatory Visit: Payer: Medicare Other | Admitting: Podiatry

## 2022-01-29 DIAGNOSIS — Z794 Long term (current) use of insulin: Secondary | ICD-10-CM | POA: Diagnosis not present

## 2022-01-29 DIAGNOSIS — N184 Chronic kidney disease, stage 4 (severe): Secondary | ICD-10-CM | POA: Diagnosis not present

## 2022-01-29 DIAGNOSIS — E1122 Type 2 diabetes mellitus with diabetic chronic kidney disease: Secondary | ICD-10-CM | POA: Diagnosis not present

## 2022-01-29 DIAGNOSIS — E039 Hypothyroidism, unspecified: Secondary | ICD-10-CM | POA: Diagnosis not present

## 2022-01-31 ENCOUNTER — Inpatient Hospital Stay (HOSPITAL_BASED_OUTPATIENT_CLINIC_OR_DEPARTMENT_OTHER): Payer: Medicare Other | Admitting: Hematology and Oncology

## 2022-01-31 ENCOUNTER — Encounter: Payer: Self-pay | Admitting: Hematology and Oncology

## 2022-01-31 ENCOUNTER — Other Ambulatory Visit: Payer: Self-pay

## 2022-01-31 ENCOUNTER — Inpatient Hospital Stay: Payer: Medicare Other | Attending: Hematology and Oncology

## 2022-01-31 ENCOUNTER — Inpatient Hospital Stay: Payer: Medicare Other

## 2022-01-31 VITALS — BP 131/70 | HR 98 | Temp 98.6°F | Resp 18 | Ht 60.0 in | Wt 130.2 lb

## 2022-01-31 DIAGNOSIS — E538 Deficiency of other specified B group vitamins: Secondary | ICD-10-CM | POA: Diagnosis not present

## 2022-01-31 DIAGNOSIS — D631 Anemia in chronic kidney disease: Secondary | ICD-10-CM | POA: Insufficient documentation

## 2022-01-31 DIAGNOSIS — Z803 Family history of malignant neoplasm of breast: Secondary | ICD-10-CM | POA: Diagnosis not present

## 2022-01-31 DIAGNOSIS — Z23 Encounter for immunization: Secondary | ICD-10-CM | POA: Insufficient documentation

## 2022-01-31 DIAGNOSIS — Z79890 Hormone replacement therapy (postmenopausal): Secondary | ICD-10-CM | POA: Insufficient documentation

## 2022-01-31 DIAGNOSIS — Z79899 Other long term (current) drug therapy: Secondary | ICD-10-CM | POA: Diagnosis not present

## 2022-01-31 DIAGNOSIS — E611 Iron deficiency: Secondary | ICD-10-CM | POA: Insufficient documentation

## 2022-01-31 DIAGNOSIS — E1122 Type 2 diabetes mellitus with diabetic chronic kidney disease: Secondary | ICD-10-CM | POA: Insufficient documentation

## 2022-01-31 DIAGNOSIS — Z299 Encounter for prophylactic measures, unspecified: Secondary | ICD-10-CM

## 2022-01-31 DIAGNOSIS — N183 Chronic kidney disease, stage 3 unspecified: Secondary | ICD-10-CM | POA: Diagnosis not present

## 2022-01-31 DIAGNOSIS — N184 Chronic kidney disease, stage 4 (severe): Secondary | ICD-10-CM | POA: Insufficient documentation

## 2022-01-31 LAB — CBC WITH DIFFERENTIAL (CANCER CENTER ONLY)
Abs Immature Granulocytes: 0.03 10*3/uL (ref 0.00–0.07)
Basophils Absolute: 0 10*3/uL (ref 0.0–0.1)
Basophils Relative: 0 %
Eosinophils Absolute: 0.7 10*3/uL — ABNORMAL HIGH (ref 0.0–0.5)
Eosinophils Relative: 10 %
HCT: 29.3 % — ABNORMAL LOW (ref 36.0–46.0)
Hemoglobin: 9.4 g/dL — ABNORMAL LOW (ref 12.0–15.0)
Immature Granulocytes: 0 %
Lymphocytes Relative: 22 %
Lymphs Abs: 1.5 10*3/uL (ref 0.7–4.0)
MCH: 28.5 pg (ref 26.0–34.0)
MCHC: 32.1 g/dL (ref 30.0–36.0)
MCV: 88.8 fL (ref 80.0–100.0)
Monocytes Absolute: 0.4 10*3/uL (ref 0.1–1.0)
Monocytes Relative: 6 %
Neutro Abs: 4.2 10*3/uL (ref 1.7–7.7)
Neutrophils Relative %: 62 %
Platelet Count: 208 10*3/uL (ref 150–400)
RBC: 3.3 MIL/uL — ABNORMAL LOW (ref 3.87–5.11)
RDW: 13.4 % (ref 11.5–15.5)
WBC Count: 6.9 10*3/uL (ref 4.0–10.5)
nRBC: 0 % (ref 0.0–0.2)

## 2022-01-31 LAB — VITAMIN B12: Vitamin B-12: 194 pg/mL (ref 180–914)

## 2022-01-31 MED ORDER — INFLUENZA VAC A&B SA ADJ QUAD 0.5 ML IM PRSY
0.5000 mL | PREFILLED_SYRINGE | Freq: Once | INTRAMUSCULAR | Status: AC
  Administered 2022-01-31: 0.5 mL via INTRAMUSCULAR
  Filled 2022-01-31: qty 0.5

## 2022-01-31 MED ORDER — DARBEPOETIN ALFA 200 MCG/0.4ML IJ SOSY
200.0000 ug | PREFILLED_SYRINGE | Freq: Once | INTRAMUSCULAR | Status: AC
  Administered 2022-01-31: 200 ug via SUBCUTANEOUS
  Filled 2022-01-31: qty 0.4

## 2022-01-31 MED ORDER — CYANOCOBALAMIN 1000 MCG/ML IJ SOLN
1000.0000 ug | Freq: Once | INTRAMUSCULAR | Status: AC
  Administered 2022-01-31: 1000 ug via INTRAMUSCULAR
  Filled 2022-01-31: qty 1

## 2022-01-31 NOTE — Progress Notes (Signed)
Abigail Hoover OFFICE PROGRESS NOTE  Bo Merino, MD  ASSESSMENT & PLAN:  Vitamin B 12 deficiency She has intermittent B12 deficiency She will receive B12 injection today along with darbepoetin injection  Anemia in chronic kidney disease She has multifactorial anemia She has combined recurrent iron deficiency anemia, B12 deficiency as well as anemia chronic kidney disease She is asymptomatic She will proceed with injection today I will see her again in 3 months for further follow-up  Preventive measure We discussed the importance of preventive care and reviewed the vaccination programs. She does not have any prior allergic reactions to influenza vaccination. She agrees to proceed with influenza vaccination today and we will administer it today at the clinic.   No orders of the defined types were placed in this encounter.   The total time spent in the appointment was 20 minutes encounter with patients including review of chart and various tests results, discussions about plan of care and coordination of care plan   All questions were answered. The patient knows to call the clinic with any problems, questions or concerns. No barriers to learning was detected.    Abigail Lark, MD 11/9/202311:44 AM  INTERVAL HISTORY: Abigail Hoover 76 y.o. female returns for treatment and management of anemia of chronic kidney disease, B12 deficiency and concern for strong family history of breast cancer She is doing well Denies symptomatic anemia She denies any recent abnormal breast examination, palpable mass, abnormal breast appearance or nipple changes  SUMMARY OF HEMATOLOGIC HISTORY:  This is a patient that has been followed here since 2011 for chronic anemia In 2011, she had a bone marrow aspirate and biopsy that came back normal. The patient has been receiving Aranesp injection for chronic anemia up until 2014. It was subsequently discontinued due stability of the  anemia. Starting 05/27/2014, darbepoetin was resumed at 200 g every other month to keep hemoglobin greater than 10 g. Treatment was discontinued in October 2016 and resumed in September 2017 The patient is subsequently found to have severe vitamin B12 deficiency and is started on vitamin B12 injection on December 25, 2016 along with resumption of darbepoetin injection every 5 to 6 months.  She is also seen due to family history of breast cancer She also required intravenous iron infusion intermittently for concurrent iron deficiency anemia  I have reviewed the past medical history, past surgical history, social history and family history with the patient and they are unchanged from previous note.  ALLERGIES:  is allergic to atorvastatin and ezetimibe.  MEDICATIONS:  Current Outpatient Medications  Medication Sig Dispense Refill   aspirin EC 81 MG tablet Take 81 mg by mouth daily. Take 1 tablet by mouth once daily.     betamethasone dipropionate 0.05 % cream Apply 1 application topically 2 (two) times daily.     CARAFATE 1 GM/10ML suspension TAKE 10ML BY MOUTH 4 (FOUR) TIMES A DAY BETWEEN MEALS AND AT BEDTIME     colchicine 0.6 MG tablet TAKE ONE-HALF TABLET (0.3 MG TOTAL) BY MOUTH DAILY. 15 tablet 2   denosumab (PROLIA) 60 MG/ML SOSY injection Inject 60 mg into the skin once for 1 dose. Courier to rheum: [address], [place], [place] [address]. Appt on 01/02/22 1 mL 0   gabapentin (NEURONTIN) 300 MG capsule Take 300 mg by mouth 3 (three) times daily.     glucose blood (ONETOUCH VERIO) test strip Use Onetouch verio test strips to check blood sugar three times daily. (90 day  RX) 300 each 2   Insulin Pen Needle (PEN NEEDLES 3/16") 31G X 5 MM MISC Use 1 pen needle to inject Forteo daily. 100 each 2   Insulin Pen Needle 31G X 5 MM MISC Use with Novolog 100 each 1   Insulin Syringe-Needle U-100 25G X 5/8" 1 ML MISC USE DAILY TO INJECT INSULIN 100 each 3   isosorbide mononitrate (IMDUR) 120 MG  24 hr tablet 120 mg daily.     Lancet Devices (ONE TOUCH DELICA LANCING DEV) MISC 1 each by Does not apply route daily. Use as instructed to check blood sugar once daily. 1 each 0   Lancets (ONETOUCH DELICA PLUS [id number]) MISC TEST 3 TIMES DAILY 100 each 2   metoprolol succinate (TOPROL-XL) 50 MG 24 hr tablet Take 1 tablet (50 mg total) by mouth daily. Take with or immediately following a meal. 30 tablet 3   mirabegron ER (MYRBETRIQ) 50 MG TB24 tablet Take by mouth.     NOVOLOG FLEXPEN 100 UNIT/ML FlexPen INJECT 9-11 UNITS UNDER THE SKIN BEFORE the 3 meals 30 mL 3   pentosan polysulfate (ELMIRON) 100 MG capsule TAKE 1 CAPSULE (100 MG TOTAL) BY MOUTH 3 TIMES DAILY BEFORE MEALS.     pravastatin (PRAVACHOL) 20 MG tablet TAKE ONE TABLET BY MOUTH ONCE DAILY 15 tablet 0   sitaGLIPtin (JANUVIA) 25 MG tablet Take 25 mg by mouth daily.     SYNTHROID 75 MCG tablet TAKE ONE TABLET BY MOUTH ONCE DAILY 90 tablet 1   torsemide (DEMADEX) 20 MG tablet Take 40 mg by mouth daily.     triamcinolone (KENALOG) 0.025 % ointment Apply 1 application topically 2 (two) times daily.     Vitamin D, Ergocalciferol, (DRISDOL) 1.25 MG (50000 UNIT) CAPS capsule Take 1 capsule (50,000 Units total) by mouth every 7 (seven) days. 20 capsule 0   No current facility-administered medications for this visit.     REVIEW OF SYSTEMS:   Constitutional: Denies fevers, chills or night sweats Eyes: Denies blurriness of vision Ears, nose, mouth, throat, and face: Denies mucositis or sore throat Respiratory: Denies cough, dyspnea or wheezes Cardiovascular: Denies palpitation, chest discomfort or lower extremity swelling Gastrointestinal:  Denies nausea, heartburn or change in bowel habits Skin: Denies abnormal skin rashes Lymphatics: Denies new lymphadenopathy or easy bruising Neurological:Denies numbness, tingling or new weaknesses Behavioral/Psych: Mood is stable, no new changes  All other systems were reviewed with the patient and  are negative.  PHYSICAL EXAMINATION: ECOG PERFORMANCE STATUS: 0 - Asymptomatic  Vitals:   01/31/22 1135  BP: 131/70  Pulse: 98  Resp: 18  Temp: 98.6 F (37 C)  SpO2: 95%   Filed Weights   01/31/22 1135  Weight: 130 lb 3.2 oz (59.1 kg)    GENERAL:alert, no distress and comfortable SKIN: skin color, texture, turgor are normal, no rashes or significant lesions EYES: normal, Conjunctiva are pink and non-injected, sclera clear OROPHARYNX:no exudate, no erythema and lips, buccal mucosa, and tongue normal  NECK: supple, thyroid normal size, non-tender, without nodularity LYMPH:  no palpable lymphadenopathy in the cervical, axillary or inguinal LUNGS: clear to auscultation and percussion with normal breathing effort HEART: regular rate & rhythm and no murmurs and no lower extremity edema ABDOMEN:abdomen soft, non-tender and normal bowel sounds Musculoskeletal:no cyanosis of digits and no clubbing  NEURO: alert & oriented x 3 with fluent speech, no focal motor/sensory deficits Normal breast exam bilaterally  LABORATORY DATA:  I have reviewed the data as listed  Component Value Date/Time   NA 140 01/18/2022 1150   NA 138 12/12/2016 1150   K 4.7 01/18/2022 1150   K 4.6 12/12/2016 1150   CL 101 01/18/2022 1150   CL 103 07/27/2012 1259   CO2 21 01/18/2022 1150   CO2 31 (H) 12/12/2016 1150   GLUCOSE 223 (H) 01/18/2022 1150   GLUCOSE 337 (H) 08/14/2021 1043   GLUCOSE 149 (H) 12/12/2016 1150   GLUCOSE 118 (H) 07/27/2012 1259   BUN 28 (H) 01/18/2022 1150   BUN 23.4 12/12/2016 1150   CREATININE 1.61 (H) 01/18/2022 1150   CREATININE 2.07 (H) 08/14/2021 1043   CREATININE 1.99 (H) 07/18/2021 1032   CREATININE 1.7 (H) 12/12/2016 1150   CALCIUM 8.8 01/18/2022 1150   CALCIUM 11.4 (H) 12/12/2016 1150   PROT 6.0 (L) 07/18/2021 1032   PROT 6.5 12/12/2016 1150   ALBUMIN 4.0 04/29/2019 1451   ALBUMIN 3.9 12/12/2016 1150   AST 20 07/18/2021 1032   AST 19 12/12/2016 1150   ALT 14  07/18/2021 1032   ALT 10 12/12/2016 1150   ALKPHOS 68 04/29/2019 1451   ALKPHOS 65 12/12/2016 1150   BILITOT 0.5 07/18/2021 1032   BILITOT 0.36 12/12/2016 1150   GFRNONAA 24 (L) 08/14/2021 1043   GFRNONAA 21 (L) 09/20/2019 1506   GFRAA 25 (L) 09/20/2019 1506    No results found for: "SPEP", "UPEP"  Lab Results  Component Value Date   WBC 6.9 01/31/2022   NEUTROABS 4.2 01/31/2022   HGB 9.4 (L) 01/31/2022   HCT 29.3 (L) 01/31/2022   MCV 88.8 01/31/2022   PLT 208 01/31/2022      Chemistry      Component Value Date/Time   NA 140 01/18/2022 1150   NA 138 12/12/2016 1150   K 4.7 01/18/2022 1150   K 4.6 12/12/2016 1150   CL 101 01/18/2022 1150   CL 103 07/27/2012 1259   CO2 21 01/18/2022 1150   CO2 31 (H) 12/12/2016 1150   BUN 28 (H) 01/18/2022 1150   BUN 23.4 12/12/2016 1150   CREATININE 1.61 (H) 01/18/2022 1150   CREATININE 2.07 (H) 08/14/2021 1043   CREATININE 1.99 (H) 07/18/2021 1032   CREATININE 1.7 (H) 12/12/2016 1150      Component Value Date/Time   CALCIUM 8.8 01/18/2022 1150   CALCIUM 11.4 (H) 12/12/2016 1150   ALKPHOS 68 04/29/2019 1451   ALKPHOS 65 12/12/2016 1150   AST 20 07/18/2021 1032   AST 19 12/12/2016 1150   ALT 14 07/18/2021 1032   ALT 10 12/12/2016 1150   BILITOT 0.5 07/18/2021 1032   BILITOT 0.36 12/12/2016 1150

## 2022-01-31 NOTE — Assessment & Plan Note (Signed)
We discussed the importance of preventive care and reviewed the vaccination programs. She does not have any prior allergic reactions to influenza vaccination. She agrees to proceed with influenza vaccination today and we will administer it today at the clinic.  

## 2022-01-31 NOTE — Assessment & Plan Note (Signed)
She has intermittent B12 deficiency She will receive B12 injection today along with darbepoetin injection 

## 2022-01-31 NOTE — Patient Instructions (Signed)
Darbepoetin Alfa Injection What is this medication? DARBEPOETIN ALFA (dar be POE e tin AL fa) treats low levels of red blood cells (anemia) caused by kidney disease or chemotherapy. It works by helping your body make more red blood cells, which reduces the need for blood transfusions. This medicine may be used for other purposes; ask your health care provider or pharmacist if you have questions. COMMON BRAND NAME(S): Aranesp What should I tell my care team before I take this medication? They need to know if you have any of these conditions: Blood clots Cancer Heart disease High blood pressure On dialysis Seizures Stroke An unusual or allergic reaction to darbepoetin, latex, other medications, foods, dyes, or preservatives Pregnant or trying to get pregnant Breast-feeding How should I use this medication? This medication is injected into a vein or under the skin. It is usually given by a care team in a hospital or clinic setting. It may also be given at home. If you get this medication at home, you will be taught how to prepare and give it. Use exactly as directed. Take it as directed on the prescription label at the same time every day. Keep taking it unless your care team tells you to stop. It is important that you put your used needles and syringes in a special sharps container. Do not put them in a trash can. If you do not have a sharps container, call your pharmacist or care team to get one. A special MedGuide will be given to you by the pharmacist with each prescription and refill. Be sure to read this information carefully each time. Talk to your care team about the use of this medication in children. While this medication may be used in children as young as 1 month of age for selected conditions, precautions do apply. Overdosage: If you think you have taken too much of this medicine contact a poison control center or emergency room at once. NOTE: This medicine is only for you. Do not  share this medicine with others. What if I miss a dose? If you miss a dose, take it as soon as you can. If it is almost time for your next dose, take only that dose. Do not take double or extra doses. What may interact with this medication? Epoetin alfa Methoxy polyethylene glycol-epoetin beta This list may not describe all possible interactions. Give your health care provider a list of all the medicines, herbs, non-prescription drugs, or dietary supplements you use. Also tell them if you smoke, drink alcohol, or use illegal drugs. Some items may interact with your medicine. What should I watch for while using this medication? Visit your care team for regular checks on your progress. Check your blood pressure as directed. Know what your blood pressure should be and when to contact your care team. Your condition will be monitored carefully while you are receiving this medication. You may need blood work while taking this medication. What side effects may I notice from receiving this medication? Side effects that you should report to your care team as soon as possible: Allergic reactions--skin rash, itching, hives, swelling of the face, lips, tongue, or throat Blood clot--pain, swelling, or warmth in the leg, shortness of breath, chest pain Heart attack--pain or tightness in the chest, shoulders, arms, or jaw, nausea, shortness of breath, cold or clammy skin, feeling faint or lightheaded Increase in blood pressure Rash, fever, and swollen lymph nodes Redness, blistering, peeling, or loosening of the skin, including inside the mouth Seizures   Stroke--sudden numbness or weakness of the face, arm, or leg, trouble speaking, confusion, trouble walking, loss of balance or coordination, dizziness, severe headache, change in vision Side effects that usually do not require medical attention (report to your care team if they continue or are bothersome): Cough Stomach pain Swelling of the ankles, hands, or  feet This list may not describe all possible side effects. Call your doctor for medical advice about side effects. You may report side effects to FDA at 1-800-FDA-1088. Where should I keep my medication? Keep out of the reach of children and pets. Store in a refrigerator. Do not freeze. Do not shake. Protect from light. Keep this medication in the original container until you are ready to take it. See product for storage information. Get rid of any unused medication after the expiration date. To get rid of medications that are no longer needed or have expired: Take the medication to a medication take-back program. Check with your pharmacy or law enforcement to find a location. If you cannot return the medication, ask your pharmacist or care team how to get rid of the medication safely. NOTE: This sheet is a summary. It may not cover all possible information. If you have questions about this medicine, talk to your doctor, pharmacist, or health care provider.  2023 Elsevier/Gold Standard (2021-06-19 00:00:00)  Vitamin B12 Deficiency Vitamin B12 deficiency occurs when the body does not have enough of this important vitamin. The body needs this vitamin: To make red blood cells. To make DNA. This is the genetic material inside cells. To help the nerves work properly so they can carry messages from the brain to the body. Vitamin B12 deficiency can cause health problems, such as not having enough red blood cells in the blood (anemia). This can lead to nerve damage if untreated. What are the causes? This condition may be caused by: Not eating enough foods that contain vitamin B12. Not having enough stomach acid and digestive fluids to properly absorb vitamin B12 from the food that you eat. Having certain diseases that make it hard to absorb vitamin B12. These diseases include Crohn's disease, chronic pancreatitis, and cystic fibrosis. An autoimmune disorder in which the body does not make enough of a  protein (intrinsic factor) within the stomach, resulting in not enough absorption of vitamin B12. Having a surgery in which part of the stomach or small intestine is removed. Taking certain medicines that make it hard for the body to absorb vitamin B12. These include: Heartburn medicines, such as antacids and proton pump inhibitors. Some medicines that are used to treat diabetes. What increases the risk? The following factors may make you more likely to develop a vitamin B12 deficiency: Being an older adult. Eating a vegetarian or vegan diet that does not include any foods that come from animals. Eating a poor diet while you are pregnant. Taking certain medicines. Having alcoholism. What are the signs or symptoms? In some cases, there are no symptoms of this condition. If the condition leads to anemia or nerve damage, various symptoms may occur, such as: Weakness. Tiredness (fatigue). Loss of appetite. Numbness or tingling in your hands and feet. Redness and burning of the tongue. Depression, confusion, or memory problems. Trouble walking. If anemia is severe, symptoms can include: Shortness of breath. Dizziness. Rapid heart rate. How is this diagnosed? This condition may be diagnosed with a blood test to measure the level of vitamin B12 in your blood. You may also have other tests, including: A group of  tests that measure certain characteristics of blood cells (complete blood count, CBC). A blood test to measure intrinsic factor. A procedure where a thin tube with a camera on the end is used to look into your stomach or intestines (endoscopy). Other tests may be needed to discover the cause of the deficiency. How is this treated? Treatment for this condition depends on the cause. This condition may be treated by: Changing your eating and drinking habits, such as: Eating more foods that contain vitamin B12. Drinking less alcohol or no alcohol. Getting vitamin B12  injections. Taking vitamin B12 supplements by mouth (orally). Your health care provider will tell you which dose is best for you. Follow these instructions at home: Eating and drinking  Include foods in your diet that come from animals and contain a lot of vitamin B12. These include: Meats and poultry. This includes beef, pork, chicken, [place], and organ meats, such as liver. Seafood. This includes clams, rainbow trout, salmon, tuna, and haddock. Eggs. Dairy foods such as milk, yogurt, and cheese. Eat foods that have vitamin B12 added to them (are fortified), such as ready-to-eat breakfast cereals. Check the label on the package to see if a food is fortified. The items listed above may not be a complete list of foods and beverages you can eat and drink. Contact a dietitian for more information. Alcohol use Do not drink alcohol if: Your health care provider tells you not to drink. You are pregnant, may be pregnant, or are planning to become pregnant. If you drink alcohol: Limit how much you have to: 0-1 drink a day for women. 0-2 drinks a day for men. Know how much alcohol is in your drink. In the [place] one drink equals one 12 oz bottle of beer (355 mL), one 5 oz glass of wine (148 mL), or one 1 oz glass of hard liquor (44 mL). General instructions Get vitamin B12 injections if told to by your health care provider. Take supplements only as told by your health care provider. Follow the directions carefully. Keep all follow-up visits. This is important. Contact a health care provider if: Your symptoms come back. Your symptoms get worse or do not improve with treatment. Get help right away: You develop shortness of breath. You have a rapid heart rate. You have chest pain. You become dizzy or you faint. These symptoms may be an emergency. Get help right away. Call 911. Do not wait to see if the symptoms will go away. Do not drive yourself to the hospital. Summary Vitamin B12  deficiency occurs when the body does not have enough of this important vitamin. Common causes include not eating enough foods that contain vitamin B12, not being able to absorb vitamin B12 from the food that you eat, having a surgery in which part of the stomach or small intestine is removed, or taking certain medicines. Eat foods that have vitamin B12 in them. Treatment may include making a change in the way you eat and drink, getting vitamin B12 injections, or taking vitamin B12 supplements. This information is not intended to replace advice given to you by your health care provider. Make sure you discuss any questions you have with your health care provider. Document Revised: 11/03/2020 Document Reviewed: 11/03/2020 Elsevier Patient Education  [place].  Influenza Vaccine Injection What is this medication? INFLUENZA VACCINE (in floo EN zuh vak SEEN) reduces the risk of the influenza (flu). It does not treat influenza. It is still possible to get influenza after receiving  this vaccine, but the symptoms may be less severe or not last as long. It works by helping your immune system learn how to fight off a future infection. This medicine may be used for other purposes; ask your health care provider or pharmacist if you have questions. COMMON BRAND NAME(S): Afluria, Afluria Quadrivalent, Agriflu, Alfuria, FLUAD, FLUAD Quadrivalent, Fluarix, Fluarix Quadrivalent, Flublok, Flublok Quadrivalent, FLUCELVAX, FLUCELVAX Quadrivalent, Flulaval, Flulaval Quadrivalent, Fluvirin, Fluzone, Fluzone High-Dose, Fluzone Intradermal, Fluzone Quadrivalent What should I tell my care team before I take this medication? They need to know if you have any of these conditions: Bleeding disorder like hemophilia Fever or infection Guillain-Barre syndrome or other neurological problems Immune system problems Infection with the human immunodeficiency virus (HIV) or AIDS Low blood platelet counts Multiple  sclerosis An unusual or allergic reaction to influenza virus vaccine, latex, other medications, foods, dyes, or preservatives. Different brands of vaccines contain different allergens. Some may contain latex or eggs. Talk to your care team about your allergies to make sure that you get the right vaccine. Pregnant or trying to get pregnant Breastfeeding How should I use this medication? This vaccine is injected into a muscle or under the skin. It is given by your care team. A copy of Vaccine Information Statements will be given before each vaccination. Be sure to read this sheet carefully each time. This sheet may change often. Talk to your care team to see which vaccines are right for you. Some vaccines should not be used in all age groups. Overdosage: If you think you have taken too much of this medicine contact a poison control center or emergency room at once. NOTE: This medicine is only for you. Do not share this medicine with others. What if I miss a dose? This does not apply. What may interact with this medication? Certain medications that lower your immune system, such as etanercept, anakinra, infliximab, adalimumab Certain medications that prevent or treat blood clots, such as warfarin Chemotherapy or radiation therapy Phenytoin Steroid medications, such as prednisone or cortisone Theophylline Vaccines This list may not describe all possible interactions. Give your health care provider a list of all the medicines, herbs, non-prescription drugs, or dietary supplements you use. Also tell them if you smoke, drink alcohol, or use illegal drugs. Some items may interact with your medicine. What should I watch for while using this medication? Report any side effects that do not go away with your care team. Call your care team if any unusual symptoms occur within 6 weeks of receiving this vaccine. You may still catch the flu, but the illness is not usually as bad. You cannot get the flu from  the vaccine. The vaccine will not protect against colds or other illnesses that may cause fever. The vaccine is needed every year. What side effects may I notice from receiving this medication? Side effects that you should report to your care team as soon as possible: Allergic reactions--skin rash, itching, hives, swelling of the face, lips, tongue, or throat Side effects that usually do not require medical attention (report these to your care team if they continue or are bothersome): Chills Fatigue Headache Joint pain Loss of appetite Muscle pain Nausea Pain, redness, or irritation at injection site This list may not describe all possible side effects. Call your doctor for medical advice about side effects. You may report side effects to FDA at 1-800-FDA-1088. Where should I keep my medication? The vaccine is only given by your care team. It will not be stored  at home. NOTE: This sheet is a summary. It may not cover all possible information. If you have questions about this medicine, talk to your doctor, pharmacist, or health care provider.  2023 Elsevier/Gold Standard (2007-05-02 00:00:00)

## 2022-01-31 NOTE — Assessment & Plan Note (Signed)
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 

## 2022-02-04 ENCOUNTER — Encounter: Payer: Self-pay | Admitting: Physical Medicine and Rehabilitation

## 2022-02-04 ENCOUNTER — Encounter: Payer: Medicare Other | Admitting: Physical Medicine and Rehabilitation

## 2022-02-04 VITALS — BP 147/76 | HR 63 | Ht 60.0 in | Wt 132.0 lb

## 2022-02-04 DIAGNOSIS — M25552 Pain in left hip: Secondary | ICD-10-CM

## 2022-02-04 MED ORDER — DICLOFENAC SODIUM 1 % EX GEL: 2.0000 g | Freq: Four times a day (QID) | CUTANEOUS | 3 refills | Status: DC

## 2022-02-04 NOTE — Progress Notes (Signed)
5-'10Hz'$  Power 60-90 Shocks 2000 3rd session today  The risks and benefits of extracoporeal shock wave therapy were discussed with patient and consent form was signed.   The appropriate setting of "greater trochanteric bursitis" was selected and the shock wave therapy was administered to the patient's left hip until a current of 2,000 was achieved.  The patient tolerated the procedure well without side effects.  Post-procedure instructions were given.

## 2022-02-12 ENCOUNTER — Ambulatory Visit: Payer: Medicare Other

## 2022-02-12 DIAGNOSIS — I2089 Other forms of angina pectoris: Secondary | ICD-10-CM

## 2022-02-12 DIAGNOSIS — R072 Precordial pain: Secondary | ICD-10-CM | POA: Diagnosis not present

## 2022-02-12 DIAGNOSIS — I249 Acute ischemic heart disease, unspecified: Secondary | ICD-10-CM | POA: Diagnosis not present

## 2022-02-13 ENCOUNTER — Other Ambulatory Visit: Payer: Self-pay | Admitting: Physician Assistant

## 2022-02-18 NOTE — Telephone Encounter (Signed)
Next Visit: 03/08/2022   Last Visit: 04/26/2021   DX: Chondrocalcinosis    Current Dose per office note 03/14/2021: colchicine 0.3 mg by mouth daily   Labs: 01/18/2022 Glucose 223, BUN 28, Creat. 1.61, GFR 33, RBC 3.30, Hgb 9.4, Hct 29.3  Last Refill: 08/13/2021   Okay to refill Colchicine?

## 2022-02-20 ENCOUNTER — Other Ambulatory Visit (HOSPITAL_COMMUNITY): Payer: Self-pay

## 2022-02-21 ENCOUNTER — Encounter: Payer: Self-pay | Admitting: Cardiology

## 2022-02-21 ENCOUNTER — Ambulatory Visit: Payer: Medicare Other | Admitting: Cardiology

## 2022-02-21 VITALS — BP 142/56 | HR 54 | Resp 16 | Ht 60.0 in | Wt 133.0 lb

## 2022-02-21 DIAGNOSIS — I2089 Other forms of angina pectoris: Secondary | ICD-10-CM

## 2022-02-21 DIAGNOSIS — R072 Precordial pain: Secondary | ICD-10-CM

## 2022-02-21 NOTE — Progress Notes (Signed)
Patient referred by Bo Merino, MD for chest pain  Subjective:   Abigail Hoover, female    DOB: 11/08/1945, 76 y.o.   MRN: [id number]   Chief Complaint  Patient presents with   Follow-up   Hypertension   Hyperlipidemia     HPI  76 year old [place] female with hypertension, type 2 diabetes mellitus, hypothyroidism, CKD stage 3b, anemia  Symptoms of chest pain and dyspnea are improved. Reviewed recent test results with the patient, details below.   Initial consultation HPI 12/2021: Patient reports episodes of retrosternal chest pain that is particularly brought upon by emotional stress (when she cries thinking about her deceased husband who passed in early 2023). These episodes last for a few minutes and subside on their own. She does not have any exertional chest pain. That said, her physical activity is limited to walking around the house. Physical activity is significantly limited due to left hip pain. She did report that she does not walk treadmill because it causes her to have chest pain.   Patient  is currently taking metoprolol succinate and Imdur. She previously saw Dr. Terrence Dupont in 2016 andr Ganji in 2017. Prior workup showed normal EF, no ischemia on stress testing, cardiac catheterization with mild (10-20%) nonobstructive disease.      Current Outpatient Medications:    acarbose (PRECOSE) 50 MG tablet, TAKE 1 TABLET BY MOUTH AT BREAKFAST, 1 TABLET AT LUNCH, AND 1 AND A HALF TABLETS AT DINNER., Disp: , Rfl:    amLODipine (NORVASC) 2.5 MG tablet, , Disp: , Rfl:    aspirin EC 81 MG tablet, Take 81 mg by mouth daily. Take 1 tablet by mouth once daily., Disp: , Rfl:    betamethasone dipropionate 0.05 % cream, Apply 1 application topically 2 (two) times daily., Disp: , Rfl:    CARAFATE 1 GM/10ML suspension, TAKE 10ML BY MOUTH 4 (FOUR) TIMES A DAY BETWEEN MEALS AND AT BEDTIME, Disp: , Rfl:    Cholecalciferol (VITAMIN D3) 1000 units CAPS, Vitamin D3, Disp: , Rfl:     Cholecalciferol 125 MCG (5000 UT) TABS, Take by mouth., Disp: , Rfl:    colchicine 0.6 MG tablet, TAKE ONE-HALF TABLET (0.3 MG TOTAL) BY MOUTH DAILY., Disp: 15 tablet, Rfl: 2   Continuous Blood Gluc Sensor (DEXCOM G7 SENSOR) MISC, Inject 1 sensor to the skin every 10 days for continuous glucose monitoring., Disp: , Rfl:    denosumab (PROLIA) 60 MG/ML SOSY injection, , Disp: , Rfl:    diazepam (VALIUM) 5 MG tablet, TAKE 1 TABLET (5 MG TOTAL) BY MOUTH ONCE FOR 1 DOSE. PRIOR TO MRI, Disp: , Rfl:    diclofenac Sodium (VOLTAREN ARTHRITIS PAIN) 1 % GEL, Apply 2 g topically 4 (four) times daily., Disp: 50 g, Rfl: 3   esomeprazole (EQ ESOMEPRAZOLE MAGNESIUM) 20 MG capsule, , Disp: , Rfl:    famotidine (PEPCID) 40 MG tablet, 1 tab(s) orally 2 times a day for 30 days, Disp: , Rfl:    furosemide (LASIX) 40 MG tablet, Take 1 tablet by mouth 2 (two) times daily., Disp: , Rfl:    gabapentin (NEURONTIN) 300 MG capsule, Take 300 mg by mouth 3 (three) times daily., Disp: , Rfl:    glucose blood (ONETOUCH ULTRA) test strip, 4 (four) times daily., Disp: , Rfl:    glucose blood (ONETOUCH VERIO) test strip, Use Onetouch verio test strips to check blood sugar three times daily. (90 day RX), Disp: 300 each, Rfl: 2   hydrocortisone 2.5 % ointment,  APPLY TO AFFECED ARES(S) TOPICALLY TWICE DAILY AS NEEDED, Disp: , Rfl:    ibandronate (BONIVA) 150 MG tablet, TAKE 1 TABLET BY MOUTH EVERY MONTH IN THE MORNING ON AN EMPTYSTOMACH WITH FULL GLASS OF WATER. DO NOT LIE DOWN FOR 30 MINUTES, Disp: , Rfl:    Insulin Pen Needle (PEN NEEDLES 3/16") 31G X 5 MM MISC, Use 1 pen needle to inject Forteo daily., Disp: 100 each, Rfl: 2   Insulin Pen Needle 31G X 5 MM MISC, Use with Novolog, Disp: 100 each, Rfl: 1   Insulin Syringe-Needle U-100 25G X 5/8" 1 ML MISC, USE DAILY TO INJECT INSULIN, Disp: 100 each, Rfl: 3   isosorbide mononitrate (IMDUR) 120 MG 24 hr tablet, Take 1 tablet by mouth every morning., Disp: , Rfl:    lactulose  (CHRONULAC) 10 GM/15ML solution, Take 20 g by mouth daily., Disp: , Rfl:    Lancet Devices (ONE TOUCH DELICA LANCING DEV) MISC, 1 each by Does not apply route daily. Use as instructed to check blood sugar once daily., Disp: 1 each, Rfl: 0   Lancets (ONETOUCH DELICA PLUS [id number]) MISC, 4 (four) times daily., Disp: , Rfl:    Levothyroxine Sodium (SYNTHROID PO), Synthroid, Disp: , Rfl:    metaxalone (SKELAXIN) 800 MG tablet, , Disp: , Rfl:    metoprolol succinate (TOPROL-XL) 50 MG 24 hr tablet, Take 1 tablet (50 mg total) by mouth daily. Take with or immediately following a meal., Disp: 30 tablet, Rfl: 3   mirabegron ER (MYRBETRIQ) 50 MG TB24 tablet, Take by mouth., Disp: , Rfl:    nitroGLYCERIN (NITROSTAT) 0.4 MG SL tablet, PLACE 1 TAB UNDER TONGUE FOR CHEST PAIN EVERY 5 MINUTES     MAXIMUM OF 3 DOSES     CALL 911 AFTER FIRST DOSE., Disp: , Rfl:    NOVOLOG FLEXPEN 100 UNIT/ML FlexPen, INJECT 9-11 UNITS UNDER THE SKIN BEFORE the 3 meals, Disp: 30 mL, Rfl: 3   oxybutynin (DITROPAN-XL) 10 MG 24 hr tablet, TAKE 1 TABLET BY MOUTH DAILY X 3 DAYS, Disp: , Rfl:    pantoprazole (PROTONIX) 40 MG tablet, 1 tab(s) orally 20 minutes before breakfast for 90 days, Disp: , Rfl:    pentosan polysulfate (ELMIRON) 100 MG capsule, TAKE 1 CAPSULE (100 MG TOTAL) BY MOUTH 3 TIMES DAILY BEFORE MEALS., Disp: , Rfl:    pentosan polysulfate (ELMIRON) 100 MG capsule, Take 1 capsule by mouth 3 (three) times daily., Disp: , Rfl:    Pentosan Polysulfate Sodium (ELMIRON PO), [place], Disp: , Rfl:    phenazopyridine (PYRIDIUM) 100 MG tablet, TAKE ONE TABLET DAILY BY MOUTH AS NEEDED FOR BURNING, Disp: , Rfl:    pravastatin (PRAVACHOL) 20 MG tablet, TAKE ONE TABLET BY MOUTH ONCE DAILY, Disp: 15 tablet, Rfl: 0   pravastatin (PRAVACHOL) 20 MG tablet, Take 1 tablet by mouth daily., Disp: , Rfl:    repaglinide (PRANDIN) 1 MG tablet, TAKE 1 TABLET (1 MG TOTAL) BY MOUTH 2 (TWO) TIMES DAILY BEFORE A MEAL., Disp: , Rfl:    repaglinide  (PRANDIN) 2 MG tablet, TAKE 1 TABLET BEFORE BREAKFAST AND 2 TABLETS BEFORE DINNER, Disp: , Rfl:    Semaglutide,0.25 or 0.5MG/DOS, (OZEMPIC, 0.25 OR 0.5 MG/DOSE,) 2 MG/1.5ML SOPN, INJECT 0.5MG UNDER THE SKIN ONCE A WEEK, Disp: , Rfl:    sitaGLIPtin (JANUVIA) 25 MG tablet, Take 25 mg by mouth daily., Disp: , Rfl:    sitaGLIPtin-metformin (JANUMET) 50-500 MG tablet, Take 1 tablet by mouth 2 (two) times daily with a meal.,  Disp: , Rfl:    sucralfate (CARAFATE) 1 g tablet, 1 tab(s) crush and mix in water 4 times a day (between meals and at bedtime) for 90 days, Disp: , Rfl:    SYNTHROID 75 MCG tablet, TAKE ONE TABLET BY MOUTH ONCE DAILY, Disp: 90 tablet, Rfl: 1   Teriparatide, Recombinant, (FORTEO [place]), Forteo, Disp: , Rfl:    Teriparatide, Recombinant, (FORTEO) 600 MCG/2.4ML SOPN, INJECT 20 MCG INTO THE SKIN DAILY. DISCARD AFTER 28 DAYS., Disp: , Rfl:    torsemide (DEMADEX) 20 MG tablet, Take 1 tablet by mouth daily., Disp: , Rfl:    triamcinolone (KENALOG) 0.025 % ointment, Apply 1 application topically 2 (two) times daily., Disp: , Rfl:    triamcinolone cream (KENALOG) 0.1 %, APPLICATIONS; APPLY TO AFFECTED AREA TWICE A DAY, Disp: , Rfl:    Vitamin D, Ergocalciferol, (DRISDOL) 1.25 MG (50000 UNIT) CAPS capsule, Take 1 capsule (50,000 Units total) by mouth every 7 (seven) days., Disp: 20 capsule, Rfl: 0   Cardiovascular and other pertinent studies:  Reviewed external labs and tests, independently interpreted  [place] Nuclear stress test 02/12/2022: Myocardial perfusion is normal. Overall LV systolic function is normal without regional wall motion abnormalities. Stress LV EF: 73%.  Nondiagnostic ECG stress. The heart rate response was consistent with Regadenoson.  No previous exam available for comparison. Low risk study.   Echocardiogram 01/21/2022:  Normal LV systolic function with visual EF 55-60%. Left ventricle cavity  is normal in size. Normal left ventricular wall thickness. Normal  global  wall motion. Doppler evidence of grade I (impaired) diastolic dysfunction,  normal LAP. Calculated EF 56%.  Native mitral valve.  Mild (Grade I) mitral regurgitation.  Structurally normal tricuspid valve with no regurgitation. No evidence of  pulmonary hypertension.  No prior available for comparison.   EKG 01/16/2022: Probable sinus rhythm 55 bpm Baseline artifact  Coronary angiography 12/2023 (Dr. Terrence Dupont): Mid LAD lesion, 10% stenosed. Dist Cx lesion, 20% stenosed. Mid RCA lesion, 20% stenosed. The left ventricular systolic function is normal.  Recent labs: 01/31/2022: H/H 9.4/29.3. MCV 88. Platelets 208  01/18/2022: Glucose 223, BUN/Cr 28/1.61. EGFR 33. Na/K 140/4.7.  Chol 150, TG 212, HDL 49, LDL 66  11/30/2021: Glucose 148, BUN/Cr 36/1.70. EGFR 31. Na/K 143/4.4.  H/H 10.8/24.6. MCV 87. Platelets 169 (07/2021) HbA1C 7.0%   Review of Systems  Cardiovascular:  Positive for leg swelling. Negative for chest pain, dyspnea on exertion, palpitations and syncope.  Musculoskeletal:  Positive for joint pain.         Vitals:   02/21/22 0951 02/21/22 1002  BP: (!) 121/100 (!) 142/56  Pulse: (!) 51 (!) 54  Resp: 16   SpO2: 99% 99%     Body mass index is 25.97 kg/m. Filed Weights   02/21/22 0951  Weight: 133 lb (60.3 kg)     Objective:   Physical Exam Vitals and nursing note reviewed.  Constitutional:      General: She is not in acute distress. Neck:     Vascular: No JVD.  Cardiovascular:     Rate and Rhythm: Normal rate and regular rhythm.     Heart sounds: Normal heart sounds. No murmur heard. Pulmonary:     Effort: Pulmonary effort is normal.     Breath sounds: Normal breath sounds. No wheezing or rales.  Musculoskeletal:     Right lower leg: Edema (1+) present.     Left lower leg: Edema (1+) present.         Visit diagnoses:  ICD-10-CM   1. Precordial pain  R07.2     2. Stable angina  I20.89         No orders of the defined types  were placed in this encounter.    Assessment & Recommendations:   76 year old [place] female with hypertension, type 2 diabetes mellitus, hypothyroidism, CKD stage 3b, referred for chest pain  Chest pain: Improved. No ischemia on stress testing. No severe abnormalities on echocardiogram (12/2021). Continue Aspirin, stain.  Continue torsemide 20 mg daily for now.  No indication for invasive workup. Continue management for anemia.  F/u in 6 months   Nigel Mormon, MD Pager: [phone] Office: [phone]

## 2022-02-22 DIAGNOSIS — N184 Chronic kidney disease, stage 4 (severe): Secondary | ICD-10-CM | POA: Diagnosis not present

## 2022-02-22 DIAGNOSIS — E1122 Type 2 diabetes mellitus with diabetic chronic kidney disease: Secondary | ICD-10-CM | POA: Diagnosis not present

## 2022-02-22 DIAGNOSIS — Z794 Long term (current) use of insulin: Secondary | ICD-10-CM | POA: Diagnosis not present

## 2022-02-22 NOTE — Progress Notes (Signed)
Office Visit Note  Patient: Abigail Hoover             Date of Birth: 1945/10/10           MRN: [id number]             PCP: Bo Merino, MD Referring: Bo Merino, MD Visit Date: 03/08/2022 Occupation: '@GUAROCC'$ @  Subjective:  Medication monitoring  History of Present Illness: Abigail Hoover is a 76 y.o. female with history of osteoarthritis, degenerative disc disease and osteoporosis.  She continues to have pain and discomfort in her lower back.  She states that stiffness in her hands and knee joints is manageable.  She has been getting Prolia injections since April 2023.  She has been tolerating Prolia without any side effects.  She states the discomfort in her joints is manageable.  She has not had a flare of pseudogout.  She has been taking colchicine 0.6 mg half a tablet daily.  She continues to have swelling on her legs.  She has been followed by cardiologist closely.  She also sees nephrologist on a regular basis.  Activities of Daily Living:  Patient reports morning stiffness for 0 minutes.   Patient Denies nocturnal pain.  Difficulty dressing/grooming: Denies Difficulty climbing stairs: Denies Difficulty getting out of chair: Denies Difficulty using hands for taps, buttons, cutlery, and/or writing: Reports  Review of Systems  Constitutional:  Positive for fatigue.  HENT:  Negative for mouth sores and mouth dryness.   Eyes:  Negative for dryness.  Respiratory:  Negative for shortness of breath.   Cardiovascular:  Negative for chest pain and palpitations.  Gastrointestinal:  Negative for blood in stool, constipation and diarrhea.  Endocrine: Negative for increased urination.  Genitourinary:  Negative for involuntary urination.  Musculoskeletal:  Positive for joint pain, joint pain and joint swelling. Negative for gait problem, myalgias, muscle weakness, morning stiffness, muscle tenderness and myalgias.  Skin:  Positive for hair loss. Negative for color change,  rash and sensitivity to sunlight.  Allergic/Immunologic: Negative for susceptible to infections.  Neurological:  Negative for dizziness and headaches.  Hematological:  Negative for swollen glands.  Psychiatric/Behavioral:  Negative for depressed mood and sleep disturbance. The patient is not nervous/anxious.     PMFS History:  Patient Active Problem List   Diagnosis Date Noted   Preventive measure 01/31/2022   Precordial pain 01/16/2022   Stable angina 01/16/2022   DDD (degenerative disc disease), lumbar 08/26/2019   Age-related osteoporosis without current pathological fracture 08/26/2019   Primary osteoarthritis of both knees 08/26/2019   Primary osteoarthritis of both hands 08/26/2019   Diabetes mellitus with chronic kidney disease, with long-term current use of insulin ([place]) 05/11/2019   Pain due to onychomycosis of toenails of both feet 09/30/2018   Trigger thumb of right hand 09/02/2018   Chronic pain of right thumb 08/27/2018   Plantar fasciitis of left foot 05/19/2018   Lower limb pain, inferior, left 04/28/2018   Dry skin 03/12/2018   Neck pain 01/27/2018   Other fatigue 09/18/2017   Pain and swelling of right wrist 08/06/2017   At high risk for breast cancer 03/06/2017   Hypercalcemia 12/16/2016   Vitamin B 12 deficiency 12/16/2016   Displaced fracture of left femoral neck ([place]) 03/02/2016   Hyperkalemia 03/01/2016   Femoral neck fracture ([place]) 02/29/2016   Anemia due to stage 3 (moderate) chronic renal failure treated with erythropoietin ([place]) 12/18/2015   Chronic kidney disease, stage III (moderate) ([place]) 08/14/2015   Anemia  in chronic kidney disease 05/01/2015   Acute coronary syndrome ([place]) 01/02/2015   Lumbar spondylosis with myelopathy 06/27/2014   Deficiency anemia 05/27/2014   Carpal tunnel syndrome 12/09/2013   Diabetic neuropathy ([place]) 12/09/2013   Type 2 diabetes mellitus with stage 4 chronic kidney disease, with long-term current use of insulin ([place])  04/21/2013   Disturbance of skin sensation 04/21/2013   Hypothyroidism 10/30/2012   Breast mass in female-left 03/10/2012   Weight loss 11/28/2011   Increased frequency of urination 10/16/2011   Urinary urgency 10/16/2011   Family history of breast cancer 08/01/2010   Essential hypertension 02/01/2009   EDEMA LEGS 02/01/2009    Past Medical History:  Diagnosis Date   Anemia in chronic kidney disease 05/01/2015   Anxiety    Arthritis    Bladder irritation    Diabetes mellitus    GERD (gastroesophageal reflux disease)    Hypercholesterolemia    Hypertension    Hypothyroidism    Iron deficiency anemia, unspecified    Psoriasis    Transfusion history 03/05/2016   for postoperative (ORIF) anemia superimposed on chronic anemia    Family History  Problem Relation Age of Onset   Heart disease Mother        heart attack   Stroke Father        brain hemorrhage   Diabetes Brother    Breast cancer Sister    Cancer Sister        breast cancer   Past Surgical History:  Procedure Laterality Date   ABDOMINAL HYSTERECTOMY     TAH   BREAST BIOPSY     Left breast biopsy benign lesion   BREAST BIOPSY  03/11/2012   Procedure: BREAST BIOPSY;  Surgeon: Odis Hollingshead, MD;  Location: [facility];  Service: General;  Laterality: Left;  remove left breast mass   BREAST EXCISIONAL BIOPSY Left    CARDIAC CATHETERIZATION N/A 01/02/2015   Procedure: Left Heart Cath and Coronary Angiography;  Surgeon: Charolette Forward, MD;  Location: [facility];  Service: Cardiovascular;  Laterality: N/A;   CATARACT EXTRACTION     CERVICAL DISC SURGERY     COLONOSCOPY  2013   neg. next 2023.    EYE SURGERY     IR KYPHO EA ADDL LEVEL THORACIC OR LUMBAR  09/09/2019   IR KYPHO LUMBAR INC FX REDUCE BONE BX UNI/BIL CANNULATION INC/IMAGING  09/09/2019   IR RADIOLOGIST EVAL & MGMT  09/08/2019   LEFT HIP SURGERY     TOTAL HIP ARTHROPLASTY Left 03/02/2016   Procedure: TOTAL HIP ARTHROPLASTY ANTERIOR APPROACH;   Surgeon: Rod Can, MD;  Location: [facility];  Service: Orthopedics;  Laterality: Left;   Social History   Social History Narrative   Patient is married with one child.   Patient is right handed.   Patient has college education.   Patient drinks tea, two times a day   Immunization History  Administered Date(s) Administered   Fluad Quad(high Dose 65+) 12/22/2018, 01/31/2022   Influenza Split 11/13/2010   Influenza, High Dose Seasonal PF 01/25/2016, 12/24/2016, 01/03/2018   Influenza,inj,Quad PF,6+ Mos 12/29/2012, 12/20/2013, 01/16/2015   Influenza,inj,quad, With Preservative 01/03/2018, 12/24/2018   Pneumococcal Conjugate-13 01/16/2015   Pneumococcal Polysaccharide-23 03/24/2013     Objective: Vital Signs: BP (!) 166/67 (BP Location: Right Arm, Patient Position: Sitting, Cuff Size: Normal)   Pulse 61   Resp 13   Ht '4\' 8"'$  (1.422 m)   Wt 134 lb (60.8 kg)   BMI 30.04 kg/m  Physical Exam Vitals and nursing note reviewed.  Constitutional:      Appearance: She is well-developed.  HENT:     Head: Normocephalic and atraumatic.  Eyes:     Conjunctiva/sclera: Conjunctivae normal.  Cardiovascular:     Rate and Rhythm: Normal rate and regular rhythm.     Heart sounds: Normal heart sounds.  Pulmonary:     Effort: Pulmonary effort is normal.     Breath sounds: Normal breath sounds.  Abdominal:     General: Bowel sounds are normal.     Palpations: Abdomen is soft.  Musculoskeletal:     Cervical back: Normal range of motion.     Right lower leg: Edema present.     Left lower leg: Edema present.  Lymphadenopathy:     Cervical: No cervical adenopathy.  Skin:    General: Skin is warm and dry.     Capillary Refill: Capillary refill takes less than 2 seconds.  Neurological:     Mental Status: She is alert and oriented to person, place, and time.  Psychiatric:        Behavior: Behavior normal.      Musculoskeletal Exam: She had limited lateral rotation of the cervical  spine.  Shoulder joints, elbow joints, wrist joints, MCPs PIPs and DIPs been good range of motion.  She has severe kyphosis of the thoracic and lumbar spine.  She had mild tenderness in the lower lumbar region.  Hip joints were difficult to assess in the sitting position.  Knee joints were in good range of motion without any warmth swelling or effusion.  There was no tenderness over ankles or MTPs.  Bilateral lower extremity edema was noted.  CDAI Exam: CDAI Score: -- Patient Global: --; Provider Global: -- Swollen: --; Tender: -- Joint Exam 03/08/2022   No joint exam has been documented for this visit   There is currently no information documented on the homunculus. Go to the Rheumatology activity and complete the homunculus joint exam.  Investigation: No additional findings.  Imaging: PCV MYOCARDIAL PERFUSION WITH LEXISCAN  Result Date: 02/16/2022 Lexiscan Nuclear stress test 02/12/2022: Myocardial perfusion is normal. Overall LV systolic function is normal without regional wall motion abnormalities. Stress LV EF: 73%. Nondiagnostic ECG stress. The heart rate response was consistent with Regadenoson. No previous exam available for comparison. Low risk study.    Recent Labs: Lab Results  Component Value Date   WBC 6.9 01/31/2022   HGB 9.4 (L) 01/31/2022   PLT 208 01/31/2022   NA 140 01/18/2022   K 4.7 01/18/2022   CL 101 01/18/2022   CO2 21 01/18/2022   GLUCOSE 223 (H) 01/18/2022   BUN 28 (H) 01/18/2022   CREATININE 1.61 (H) 01/18/2022   BILITOT 0.5 07/18/2021   ALKPHOS 68 04/29/2019   AST 20 07/18/2021   ALT 14 07/18/2021   PROT 6.0 (L) 07/18/2021   ALBUMIN 4.0 04/29/2019   CALCIUM 8.8 01/18/2022   GFRAA 25 (L) 09/20/2019    Speciality Comments: Forteo started 09/29/2019 Prolia started 07/05/21  Procedures:  No procedures performed Allergies: Atorvastatin and Ezetimibe   Assessment / Plan:     Visit Diagnoses: Age-related osteoporosis with current pathological  fracture, sequela - DXADate of Scan: 09/22/2019 Lowest T-score and site measured: -2.2 Right Femoral Neck. started on Forteo on September 29, 2019 and completed on July 2022.  She was started on Prolia in April 2023.  She has been tolerating Prolia injections well without any side effects.  She has been taking  calcium rich diet.  I advised her to schedule follow-up DEXA scan with [facility].  Order is already in place.  Medication monitoring encounter - Prolia started on 07/05/2021, last dose: 01/02/2022.  Labs obtained on January 18, 2022 were reviewed.  She continues to be anemic with hemoglobin of 9.0.  Creatinine was 1.61.  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  Vertebral fracture, osteoporotic, sequela - status post kyphoplasty.  Displaced fracture of left femoral neck (HCC)  DDD (degenerative disc disease), lumbar-she has chronic discomfort in the lower back.  She states that physical therapy is not convenient for her.  Stretching exercises were advised.  Facet arthropathy, lumbar  Primary osteoarthritis of both hands-she has PIP and DIP thickening.  No synovitis was noted.  Joint protection and muscle strengthening was discussed.  Primary osteoarthritis of both knees-she continues to have some discomfort in her knee joints.  She states the pain is manageable.  She feels somewhat unstable at times.  She requested a prescription for a walker.  A prescription for rollator with a seat and basket was given.  Chondrocalcinosis -she has not had a flare of pseudogout since she has been on colchicine.  She takes colchicine 0.'3mg'$  by mouth daily.  Hyperuricemia-she has not had a gout flare.  Psoriasis-controlled on topical agents.  History of diabetes mellitus  Essential hypertension-blood pressure was elevated at 180/74 today.  Repeat   blood pressure was also elevated.  Patient states that her blood pressure is usually normal.  I advised her to monitor  blood pressure closely and follow-up with the cardiologist.  Hypothyroidism, unspecified type - followed by Dr. Dwyane Dee.  Other diabetic neurological complication associated with type 2 diabetes mellitus ([place])  Anemia due to stage 3 (moderate) chronic renal failure treated with erythropoietin ([place]) - followed by Dr. Graylon Gunning.  Patient states she gets labs with Dr. Posey Pronto every 3 months.  Orders: No orders of the defined types were placed in this encounter.  No orders of the defined types were placed in this encounter.    Follow-Up Instructions: Return in about 6 months (around 09/07/2022) for Osteoarthritis, Osteoporosis.   Bo Merino, MD  Note - This record has been created using Editor, commissioning.  Chart creation errors have been sought, but may not always  have been located. Such creation errors do not reflect on  the standard of medical care.

## 2022-02-25 DIAGNOSIS — N1832 Chronic kidney disease, stage 3b: Secondary | ICD-10-CM | POA: Diagnosis not present

## 2022-03-01 ENCOUNTER — Ambulatory Visit: Payer: Medicare Other | Admitting: Podiatry

## 2022-03-01 DIAGNOSIS — N184 Chronic kidney disease, stage 4 (severe): Secondary | ICD-10-CM | POA: Diagnosis not present

## 2022-03-01 DIAGNOSIS — Z794 Long term (current) use of insulin: Secondary | ICD-10-CM | POA: Diagnosis not present

## 2022-03-01 DIAGNOSIS — E1122 Type 2 diabetes mellitus with diabetic chronic kidney disease: Secondary | ICD-10-CM | POA: Diagnosis not present

## 2022-03-04 DIAGNOSIS — I129 Hypertensive chronic kidney disease with stage 1 through stage 4 chronic kidney disease, or unspecified chronic kidney disease: Secondary | ICD-10-CM | POA: Diagnosis not present

## 2022-03-04 DIAGNOSIS — D631 Anemia in chronic kidney disease: Secondary | ICD-10-CM | POA: Diagnosis not present

## 2022-03-04 DIAGNOSIS — N184 Chronic kidney disease, stage 4 (severe): Secondary | ICD-10-CM | POA: Diagnosis not present

## 2022-03-04 DIAGNOSIS — N2581 Secondary hyperparathyroidism of renal origin: Secondary | ICD-10-CM | POA: Diagnosis not present

## 2022-03-05 ENCOUNTER — Other Ambulatory Visit: Payer: Self-pay | Admitting: Physical Medicine and Rehabilitation

## 2022-03-06 ENCOUNTER — Ambulatory Visit: Payer: Medicare Other | Admitting: Podiatry

## 2022-03-06 ENCOUNTER — Encounter: Payer: Self-pay | Admitting: Podiatry

## 2022-03-06 ENCOUNTER — Ambulatory Visit (INDEPENDENT_AMBULATORY_CARE_PROVIDER_SITE_OTHER): Payer: Medicare Other | Admitting: Podiatry

## 2022-03-06 VITALS — BP 141/74 | HR 65

## 2022-03-06 DIAGNOSIS — M179 Osteoarthritis of knee, unspecified: Secondary | ICD-10-CM | POA: Insufficient documentation

## 2022-03-06 DIAGNOSIS — M25562 Pain in left knee: Secondary | ICD-10-CM | POA: Diagnosis not present

## 2022-03-06 DIAGNOSIS — N183 Chronic kidney disease, stage 3 unspecified: Secondary | ICD-10-CM

## 2022-03-06 DIAGNOSIS — E1142 Type 2 diabetes mellitus with diabetic polyneuropathy: Secondary | ICD-10-CM | POA: Diagnosis not present

## 2022-03-06 DIAGNOSIS — M79675 Pain in left toe(s): Secondary | ICD-10-CM

## 2022-03-06 DIAGNOSIS — M79674 Pain in right toe(s): Secondary | ICD-10-CM

## 2022-03-06 DIAGNOSIS — B351 Tinea unguium: Secondary | ICD-10-CM

## 2022-03-06 DIAGNOSIS — M79652 Pain in left thigh: Secondary | ICD-10-CM | POA: Diagnosis not present

## 2022-03-06 DIAGNOSIS — M545 Low back pain, unspecified: Secondary | ICD-10-CM | POA: Diagnosis not present

## 2022-03-06 DIAGNOSIS — M170 Bilateral primary osteoarthritis of knee: Secondary | ICD-10-CM | POA: Diagnosis not present

## 2022-03-06 NOTE — Progress Notes (Signed)
This patient returns to my office for at risk foot care.  This patient requires this care by a professional since this patient will be at risk due to having diabetes and CKD. This patient is unable to cut nails herself since the patient cannot reach her nails.These nails are painful walking and wearing shoes.  This patient presents for at risk foot care today.  General Appearance  Alert, conversant and in no acute stress.  Vascular  Dorsalis pedis and posterior tibial  pulses are palpable  bilaterally.  Capillary return is within normal limits  bilaterally. Temperature is within normal limits  bilaterally.  Neurologic  Senn-Weinstein monofilament wire test within normal limits  bilaterally. Muscle power within normal limits bilaterally.  Nails Thick disfigured discolored nails with subungual debris  from hallux to fifth toes bilaterally. No evidence of bacterial infection or drainage bilaterally.  Orthopedic  No limitations of motion  feet .  No crepitus or effusions noted.  No bony pathology or digital deformities noted.  Skin  normotropic skin with no porokeratosis noted bilaterally.  No signs of infections or ulcers noted.     Onychomycosis  Pain in right toes  Pain in left toes  Consent was obtained for treatment procedures.   Mechanical debridement of nails 1-5  bilaterally performed with a nail nipper.  Filed with dremel without incident.    Return office visit    4 months                  Told patient to return for periodic foot care and evaluation due to potential at risk complications.   Gardiner Barefoot DPM

## 2022-03-08 ENCOUNTER — Encounter: Payer: Self-pay | Admitting: Rheumatology

## 2022-03-08 ENCOUNTER — Ambulatory Visit: Payer: Medicare Other | Attending: Rheumatology | Admitting: Rheumatology

## 2022-03-08 VITALS — BP 166/67 | HR 61 | Resp 13 | Ht <= 58 in | Wt 134.0 lb

## 2022-03-08 DIAGNOSIS — E039 Hypothyroidism, unspecified: Secondary | ICD-10-CM

## 2022-03-08 DIAGNOSIS — E1122 Type 2 diabetes mellitus with diabetic chronic kidney disease: Secondary | ICD-10-CM | POA: Diagnosis not present

## 2022-03-08 DIAGNOSIS — E1149 Type 2 diabetes mellitus with other diabetic neurological complication: Secondary | ICD-10-CM

## 2022-03-08 DIAGNOSIS — L409 Psoriasis, unspecified: Secondary | ICD-10-CM

## 2022-03-08 DIAGNOSIS — M8008XS Age-related osteoporosis with current pathological fracture, vertebra(e), sequela: Secondary | ICD-10-CM

## 2022-03-08 DIAGNOSIS — S72002A Fracture of unspecified part of neck of left femur, initial encounter for closed fracture: Secondary | ICD-10-CM

## 2022-03-08 DIAGNOSIS — M8000XS Age-related osteoporosis with current pathological fracture, unspecified site, sequela: Secondary | ICD-10-CM | POA: Diagnosis not present

## 2022-03-08 DIAGNOSIS — M4015 Other secondary kyphosis, thoracolumbar region: Secondary | ICD-10-CM | POA: Diagnosis not present

## 2022-03-08 DIAGNOSIS — M19041 Primary osteoarthritis, right hand: Secondary | ICD-10-CM

## 2022-03-08 DIAGNOSIS — D631 Anemia in chronic kidney disease: Secondary | ICD-10-CM

## 2022-03-08 DIAGNOSIS — M19042 Primary osteoarthritis, left hand: Secondary | ICD-10-CM

## 2022-03-08 DIAGNOSIS — M5136 Other intervertebral disc degeneration, lumbar region: Secondary | ICD-10-CM

## 2022-03-08 DIAGNOSIS — Z5181 Encounter for therapeutic drug level monitoring: Secondary | ICD-10-CM

## 2022-03-08 DIAGNOSIS — E790 Hyperuricemia without signs of inflammatory arthritis and tophaceous disease: Secondary | ICD-10-CM

## 2022-03-08 DIAGNOSIS — S72022A Displaced fracture of epiphysis (separation) (upper) of left femur, initial encounter for closed fracture: Secondary | ICD-10-CM | POA: Diagnosis not present

## 2022-03-08 DIAGNOSIS — I10 Essential (primary) hypertension: Secondary | ICD-10-CM

## 2022-03-08 DIAGNOSIS — Z8639 Personal history of other endocrine, nutritional and metabolic disease: Secondary | ICD-10-CM

## 2022-03-08 DIAGNOSIS — M170 Bilateral primary osteoarthritis of knee: Secondary | ICD-10-CM | POA: Diagnosis not present

## 2022-03-08 DIAGNOSIS — M1120 Other chondrocalcinosis, unspecified site: Secondary | ICD-10-CM

## 2022-03-08 DIAGNOSIS — M8008XD Age-related osteoporosis with current pathological fracture, vertebra(e), subsequent encounter for fracture with routine healing: Secondary | ICD-10-CM | POA: Diagnosis not present

## 2022-03-08 DIAGNOSIS — N183 Chronic kidney disease, stage 3 unspecified: Secondary | ICD-10-CM

## 2022-03-08 DIAGNOSIS — Z79891 Long term (current) use of opiate analgesic: Secondary | ICD-10-CM

## 2022-03-08 DIAGNOSIS — M47816 Spondylosis without myelopathy or radiculopathy, lumbar region: Secondary | ICD-10-CM

## 2022-03-08 NOTE — Patient Instructions (Signed)
Schedule DEXA scan with [facility] [phone]

## 2022-03-26 DIAGNOSIS — Z794 Long term (current) use of insulin: Secondary | ICD-10-CM | POA: Diagnosis not present

## 2022-03-26 DIAGNOSIS — E1165 Type 2 diabetes mellitus with hyperglycemia: Secondary | ICD-10-CM | POA: Diagnosis not present

## 2022-03-27 DIAGNOSIS — Z780 Asymptomatic menopausal state: Secondary | ICD-10-CM | POA: Diagnosis not present

## 2022-03-27 DIAGNOSIS — M8589 Other specified disorders of bone density and structure, multiple sites: Secondary | ICD-10-CM | POA: Diagnosis not present

## 2022-03-29 ENCOUNTER — Telehealth: Payer: Self-pay | Admitting: *Deleted

## 2022-03-29 NOTE — Telephone Encounter (Signed)
Received DEXA results from [facility].  Date of Scan: 03/27/2022  Lowest T-score:-2.4  BMD:0.587  Lowest site measured:Right Femoral Neck  DX: Osteopenia  Significant changes in BMD and site measured (5% and above):8 % Right Total Femur  Current Regimen:Vitamin D, Prolia, last injection 01/02/2022  Recommendation:Continue current treatment   Reviewed by:Dr. Bo Merino   Next Appointment:  Due June 2024

## 2022-04-01 ENCOUNTER — Encounter: Payer: Self-pay | Admitting: Hematology and Oncology

## 2022-04-01 DIAGNOSIS — Z794 Long term (current) use of insulin: Secondary | ICD-10-CM | POA: Diagnosis not present

## 2022-04-01 DIAGNOSIS — Z7984 Long term (current) use of oral hypoglycemic drugs: Secondary | ICD-10-CM | POA: Diagnosis not present

## 2022-04-01 DIAGNOSIS — I129 Hypertensive chronic kidney disease with stage 1 through stage 4 chronic kidney disease, or unspecified chronic kidney disease: Secondary | ICD-10-CM | POA: Diagnosis not present

## 2022-04-01 DIAGNOSIS — E039 Hypothyroidism, unspecified: Secondary | ICD-10-CM | POA: Diagnosis not present

## 2022-04-01 DIAGNOSIS — N184 Chronic kidney disease, stage 4 (severe): Secondary | ICD-10-CM | POA: Diagnosis not present

## 2022-04-01 DIAGNOSIS — E1122 Type 2 diabetes mellitus with diabetic chronic kidney disease: Secondary | ICD-10-CM | POA: Diagnosis not present

## 2022-04-09 ENCOUNTER — Encounter: Payer: Self-pay | Admitting: Hematology and Oncology

## 2022-04-09 ENCOUNTER — Encounter (INDEPENDENT_AMBULATORY_CARE_PROVIDER_SITE_OTHER): Payer: Medicare Other | Admitting: Ophthalmology

## 2022-04-09 DIAGNOSIS — H35033 Hypertensive retinopathy, bilateral: Secondary | ICD-10-CM | POA: Diagnosis not present

## 2022-04-09 DIAGNOSIS — E113393 Type 2 diabetes mellitus with moderate nonproliferative diabetic retinopathy without macular edema, bilateral: Secondary | ICD-10-CM | POA: Diagnosis not present

## 2022-04-09 DIAGNOSIS — H43813 Vitreous degeneration, bilateral: Secondary | ICD-10-CM | POA: Diagnosis not present

## 2022-04-09 DIAGNOSIS — I10 Essential (primary) hypertension: Secondary | ICD-10-CM

## 2022-04-11 ENCOUNTER — Telehealth: Payer: Self-pay | Admitting: *Deleted

## 2022-04-11 ENCOUNTER — Ambulatory Visit: Payer: Medicare Other | Admitting: Internal Medicine

## 2022-04-11 ENCOUNTER — Ambulatory Visit: Payer: Medicare Other | Admitting: Physical Medicine and Rehabilitation

## 2022-04-11 NOTE — Telephone Encounter (Signed)
Attempted to contact the patient and left message for patient to call the office to discuss DEXA results.

## 2022-04-23 ENCOUNTER — Encounter: Payer: Self-pay | Admitting: Rheumatology

## 2022-04-25 ENCOUNTER — Other Ambulatory Visit (HOSPITAL_COMMUNITY): Payer: Self-pay | Admitting: Gastroenterology

## 2022-04-25 DIAGNOSIS — R1031 Right lower quadrant pain: Secondary | ICD-10-CM

## 2022-04-25 DIAGNOSIS — D638 Anemia in other chronic diseases classified elsewhere: Secondary | ICD-10-CM | POA: Diagnosis not present

## 2022-04-25 DIAGNOSIS — K5900 Constipation, unspecified: Secondary | ICD-10-CM | POA: Diagnosis not present

## 2022-04-25 DIAGNOSIS — K219 Gastro-esophageal reflux disease without esophagitis: Secondary | ICD-10-CM | POA: Diagnosis not present

## 2022-05-01 ENCOUNTER — Other Ambulatory Visit: Payer: Self-pay

## 2022-05-01 DIAGNOSIS — E538 Deficiency of other specified B group vitamins: Secondary | ICD-10-CM

## 2022-05-01 DIAGNOSIS — D631 Anemia in chronic kidney disease: Secondary | ICD-10-CM

## 2022-05-03 ENCOUNTER — Other Ambulatory Visit: Payer: Self-pay | Admitting: Hematology and Oncology

## 2022-05-03 ENCOUNTER — Inpatient Hospital Stay: Payer: Medicare Other

## 2022-05-03 ENCOUNTER — Encounter: Payer: Self-pay | Admitting: Hematology and Oncology

## 2022-05-03 ENCOUNTER — Inpatient Hospital Stay: Payer: Medicare Other | Admitting: Hematology and Oncology

## 2022-05-03 ENCOUNTER — Inpatient Hospital Stay: Payer: Medicare Other | Attending: Hematology and Oncology

## 2022-05-03 VITALS — BP 140/69 | HR 67 | Temp 97.8°F | Resp 18 | Ht <= 58 in | Wt 133.6 lb

## 2022-05-03 DIAGNOSIS — Z79899 Other long term (current) drug therapy: Secondary | ICD-10-CM | POA: Insufficient documentation

## 2022-05-03 DIAGNOSIS — E538 Deficiency of other specified B group vitamins: Secondary | ICD-10-CM

## 2022-05-03 DIAGNOSIS — I129 Hypertensive chronic kidney disease with stage 1 through stage 4 chronic kidney disease, or unspecified chronic kidney disease: Secondary | ICD-10-CM | POA: Diagnosis not present

## 2022-05-03 DIAGNOSIS — Z803 Family history of malignant neoplasm of breast: Secondary | ICD-10-CM | POA: Diagnosis not present

## 2022-05-03 DIAGNOSIS — N183 Chronic kidney disease, stage 3 unspecified: Secondary | ICD-10-CM

## 2022-05-03 DIAGNOSIS — E611 Iron deficiency: Secondary | ICD-10-CM | POA: Diagnosis not present

## 2022-05-03 DIAGNOSIS — Z79890 Hormone replacement therapy (postmenopausal): Secondary | ICD-10-CM | POA: Insufficient documentation

## 2022-05-03 DIAGNOSIS — D631 Anemia in chronic kidney disease: Secondary | ICD-10-CM | POA: Diagnosis not present

## 2022-05-03 DIAGNOSIS — E039 Hypothyroidism, unspecified: Secondary | ICD-10-CM | POA: Diagnosis not present

## 2022-05-03 DIAGNOSIS — N184 Chronic kidney disease, stage 4 (severe): Secondary | ICD-10-CM | POA: Insufficient documentation

## 2022-05-03 DIAGNOSIS — N189 Chronic kidney disease, unspecified: Secondary | ICD-10-CM

## 2022-05-03 LAB — CBC WITH DIFFERENTIAL (CANCER CENTER ONLY)
Abs Immature Granulocytes: 0.02 10*3/uL (ref 0.00–0.07)
Basophils Absolute: 0 10*3/uL (ref 0.0–0.1)
Basophils Relative: 1 %
Eosinophils Absolute: 1.2 10*3/uL — ABNORMAL HIGH (ref 0.0–0.5)
Eosinophils Relative: 19 %
HCT: 33.3 % — ABNORMAL LOW (ref 36.0–46.0)
Hemoglobin: 10.4 g/dL — ABNORMAL LOW (ref 12.0–15.0)
Immature Granulocytes: 0 %
Lymphocytes Relative: 28 %
Lymphs Abs: 1.8 10*3/uL (ref 0.7–4.0)
MCH: 26.9 pg (ref 26.0–34.0)
MCHC: 31.2 g/dL (ref 30.0–36.0)
MCV: 86.3 fL (ref 80.0–100.0)
Monocytes Absolute: 0.3 10*3/uL (ref 0.1–1.0)
Monocytes Relative: 5 %
Neutro Abs: 3.1 10*3/uL (ref 1.7–7.7)
Neutrophils Relative %: 47 %
Platelet Count: 171 10*3/uL (ref 150–400)
RBC: 3.86 MIL/uL — ABNORMAL LOW (ref 3.87–5.11)
RDW: 15.3 % (ref 11.5–15.5)
WBC Count: 6.5 10*3/uL (ref 4.0–10.5)
nRBC: 0 % (ref 0.0–0.2)

## 2022-05-03 LAB — VITAMIN B12: Vitamin B-12: 227 pg/mL (ref 180–914)

## 2022-05-03 MED ORDER — DARBEPOETIN ALFA 200 MCG/0.4ML IJ SOSY
200.0000 ug | PREFILLED_SYRINGE | Freq: Once | INTRAMUSCULAR | Status: AC
  Administered 2022-05-03: 200 ug via SUBCUTANEOUS

## 2022-05-03 MED ORDER — CYANOCOBALAMIN 1000 MCG/ML IJ SOLN
1000.0000 ug | Freq: Once | INTRAMUSCULAR | Status: AC
  Administered 2022-05-03: 1000 ug via INTRAMUSCULAR
  Filled 2022-05-03: qty 1

## 2022-05-03 NOTE — Assessment & Plan Note (Signed)
She has intermittent B12 deficiency She will receive B12 injection today along with darbepoetin injection

## 2022-05-03 NOTE — Patient Instructions (Signed)
Vitamin B12 Injection What is this medication? Vitamin B12 (VAHY tuh min B12) prevents and treats low vitamin B12 levels in your body. It is used in people who do not get enough vitamin B12 from their diet or when their digestive tract does not absorb enough. Vitamin B12 plays an important role in maintaining the health of your nervous system and red blood cells. This medicine may be used for other purposes; ask your health care provider or pharmacist if you have questions. COMMON BRAND NAME(S): B-12 Compliance Kit, B-12 Injection Kit, Cyomin, Dodex, LA-12, Nutri-Twelve, Physicians EZ Use B-12, Primabalt What should I tell my care team before I take this medication? They need to know if you have any of these conditions: Kidney disease Leber's disease Megaloblastic anemia An unusual or allergic reaction to cyanocobalamin, cobalt, other medications, foods, dyes, or preservatives Pregnant or trying to get pregnant Breast-feeding How should I use this medication? This medication is injected into a muscle or deeply under the skin. It is usually given in a clinic or care team's office. However, your care team may teach you how to inject yourself. Follow all instructions. Talk to your care team about the use of this medication in children. Special care may be needed. Overdosage: If you think you have taken too much of this medicine contact a poison control center or emergency room at once. NOTE: This medicine is only for you. Do not share this medicine with others. What if I miss a dose? If you are given your dose at a clinic or care team's office, call to reschedule your appointment. If you give your own injections, and you miss a dose, take it as soon as you can. If it is almost time for your next dose, take only that dose. Do not take double or extra doses. What may interact with this medication? Alcohol Colchicine This list may not describe all possible interactions. Give your health care  provider a list of all the medicines, herbs, non-prescription drugs, or dietary supplements you use. Also tell them if you smoke, drink alcohol, or use illegal drugs. Some items may interact with your medicine. What should I watch for while using this medication? Visit your care team regularly. You may need blood work done while you are taking this medication. You may need to follow a special diet. Talk to your care team. Limit your alcohol intake and avoid smoking to get the best benefit. What side effects may I notice from receiving this medication? Side effects that you should report to your care team as soon as possible: Allergic reactions--skin rash, itching, hives, swelling of the face, lips, tongue, or throat Swelling of the ankles, hands, or feet Trouble breathing Side effects that usually do not require medical attention (report to your care team if they continue or are bothersome): Diarrhea This list may not describe all possible side effects. Call your doctor for medical advice about side effects. You may report side effects to FDA at 1-800-FDA-1088. Where should I keep my medication? Keep out of the reach of children. Store at room temperature between 15 and 30 degrees C (59 and 85 degrees F). Protect from light. Throw away any unused medication after the expiration date. NOTE: This sheet is a summary. It may not cover all possible information. If you have questions about this medicine, talk to your doctor, pharmacist, or health care provider.  2023 Elsevier/Gold Standard (2007-05-02 00:00:00) Darbepoetin Alfa Injection What is this medication? DARBEPOETIN ALFA (dar be POE e tin  AL fa) treats low levels of red blood cells (anemia) caused by kidney disease or chemotherapy. It works by Building control surveyor make more red blood cells, which reduces the need for blood transfusions. This medicine may be used for other purposes; ask your health care provider or pharmacist if you have  questions. COMMON BRAND NAME(S): Aranesp What should I tell my care team before I take this medication? They need to know if you have any of these conditions: Blood clots Cancer Heart disease High blood pressure On dialysis Seizures Stroke An unusual or allergic reaction to darbepoetin, latex, other medications, foods, dyes, or preservatives Pregnant or trying to get pregnant Breast-feeding How should I use this medication? This medication is injected into a vein or under the skin. It is usually given by a care team in a hospital or clinic setting. It may also be given at home. If you get this medication at home, you will be taught how to prepare and give it. Use exactly as directed. Take it as directed on the prescription label at the same time every day. Keep taking it unless your care team tells you to stop. It is important that you put your used needles and syringes in a special sharps container. Do not put them in a trash can. If you do not have a sharps container, call your pharmacist or care team to get one. A special MedGuide will be given to you by the pharmacist with each prescription and refill. Be sure to read this information carefully each time. Talk to your care team about the use of this medication in children. While this medication may be used in children as young as 20 month of age for selected conditions, precautions do apply. Overdosage: If you think you have taken too much of this medicine contact a poison control center or emergency room at once. NOTE: This medicine is only for you. Do not share this medicine with others. What if I miss a dose? If you miss a dose, take it as soon as you can. If it is almost time for your next dose, take only that dose. Do not take double or extra doses. What may interact with this medication? Epoetin alfa Methoxy polyethylene glycol-epoetin beta This list may not describe all possible interactions. Give your health care provider a list  of all the medicines, herbs, non-prescription drugs, or dietary supplements you use. Also tell them if you smoke, drink alcohol, or use illegal drugs. Some items may interact with your medicine. What should I watch for while using this medication? Visit your care team for regular checks on your progress. Check your blood pressure as directed. Know what your blood pressure should be and when to contact your care team. Your condition will be monitored carefully while you are receiving this medication. You may need blood work while taking this medication. What side effects may I notice from receiving this medication? Side effects that you should report to your care team as soon as possible: Allergic reactions--skin rash, itching, hives, swelling of the face, lips, tongue, or throat Blood clot--pain, swelling, or warmth in the leg, shortness of breath, chest pain Heart attack--pain or tightness in the chest, shoulders, arms, or jaw, nausea, shortness of breath, cold or clammy skin, feeling faint or lightheaded Increase in blood pressure Rash, fever, and swollen lymph nodes Redness, blistering, peeling, or loosening of the skin, including inside the mouth Seizures Stroke--sudden numbness or weakness of the face, arm, or leg, trouble speaking, confusion,  trouble walking, loss of balance or coordination, dizziness, severe headache, change in vision Side effects that usually do not require medical attention (report to your care team if they continue or are bothersome): Cough Stomach pain Swelling of the ankles, hands, or feet This list may not describe all possible side effects. Call your doctor for medical advice about side effects. You may report side effects to FDA at 1-800-FDA-1088. Where should I keep my medication? Keep out of the reach of children and pets. Store in a refrigerator. Do not freeze. Do not shake. Protect from light. Keep this medication in the original container until you are ready  to take it. See product for storage information. Get rid of any unused medication after the expiration date. To get rid of medications that are no longer needed or have expired: Take the medication to a medication take-back program. Check with your pharmacy or law enforcement to find a location. If you cannot return the medication, ask your pharmacist or care team how to get rid of the medication safely. NOTE: This sheet is a summary. It may not cover all possible information. If you have questions about this medicine, talk to your doctor, pharmacist, or health care provider.  2023 Elsevier/Gold Standard (2021-06-19 00:00:00)

## 2022-05-03 NOTE — Progress Notes (Signed)
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 anemia She has combined recurrent iron deficiency anemia, B12 deficiency as well as anemia chronic kidney disease She is asymptomatic She will proceed with injection today I will see her again in 3 months for further follow-up  Vitamin B 12 deficiency She has intermittent B12 deficiency She will receive B12 injection today along with darbepoetin injection  Orders Placed This Encounter  Procedures   Vitamin B12    Standing Status:   Future    Standing Expiration Date:   05/04/2023   CBC with Differential ([place] Only)    Standing Status:   Future    Standing Expiration Date:   05/04/2023    The total time spent in the appointment was 20 minutes encounter with patients including review of chart and various tests results, discussions about plan of care and coordination of care plan   All questions were answered. The patient knows to call the clinic with any problems, questions or concerns. No barriers to learning was detected.    [name], MD 2/9/20242:39 PM  INTERVAL HISTORY: [name] 77 y.o. female returns for follow-up for anemia chronic kidney disease and vitamin B12 deficiency She is doing well She is not symptomatic from anemia The patient denies any recent signs or symptoms of bleeding such as spontaneous epistaxis, hematuria or hematochezia.  SUMMARY OF HEMATOLOGIC HISTORY:  This is a patient that has been followed here since 2011 for chronic anemia In 2011, she had a bone marrow aspirate and biopsy that came back normal. The patient has been receiving Aranesp injection for chronic anemia up until 2014. It was subsequently discontinued due stability of the anemia. Starting 05/27/2014, darbepoetin was resumed at 200 g every other month to keep hemoglobin greater than 10 g. Treatment was discontinued in  October 2016 and resumed in September 2017 The patient is subsequently found to have severe vitamin B12 deficiency and is started on vitamin B12 injection on December 25, 2016 along with resumption of darbepoetin injection every 5 to 6 months.  She is also seen due to family history of breast cancer She also required intravenous iron infusion intermittently for concurrent iron deficiency anemia  I have reviewed the past medical history, past surgical history, social history and family history with the patient and they are unchanged from previous note.  ALLERGIES:  is allergic to atorvastatin and ezetimibe.  MEDICATIONS:  Current Outpatient Medications  Medication Sig Dispense Refill   aspirin EC 81 MG tablet Take 81 mg by mouth daily. Take 1 tablet by mouth once daily.     Cholecalciferol (VITAMIN D3) 1000 units CAPS Vitamin D3     Cholecalciferol 125 MCG (5000 UT) TABS Take by mouth. (Patient not taking: Reported on 03/08/2022)     colchicine 0.6 MG tablet TAKE ONE-HALF TABLET (0.3 MG TOTAL) BY MOUTH DAILY. 15 tablet 2   Continuous Blood Gluc Sensor (DEXCOM G7 SENSOR) MISC Inject 1 sensor to the skin every 10 days for continuous glucose monitoring.     furosemide (LASIX) 40 MG tablet Take 1 tablet by mouth 2 (two) times daily. (Patient not taking: Reported on 03/08/2022)     gabapentin (NEURONTIN) 300 MG capsule Take 300 mg by mouth 3 (three) times daily.     glucose blood (ONETOUCH VERIO) test strip Use Onetouch verio test strips to check blood sugar three times daily. (90 day RX)  300 each 2   hydrocortisone 2.5 % ointment APPLY TO AFFECED ARES(S) TOPICALLY TWICE DAILY AS NEEDED (Patient not taking: Reported on 03/08/2022)     Insulin Pen Needle (PEN NEEDLES 3/16") 31G X 5 MM MISC Use 1 pen needle to inject Forteo daily. 100 each 2   Insulin Pen Needle 31G X 5 MM MISC Use with Novolog 100 each 1   Insulin Syringe-Needle U-100 25G X 5/8" 1 ML MISC USE DAILY TO INJECT INSULIN 100 each 3    isosorbide mononitrate (IMDUR) 120 MG 24 hr tablet Take 1 tablet by mouth every morning.     lactulose (CHRONULAC) 10 GM/15ML solution Take 20 g by mouth daily. (Patient not taking: Reported on 03/08/2022)     Lancet Devices (ONE TOUCH DELICA LANCING DEV) MISC 1 each by Does not apply route daily. Use as instructed to check blood sugar once daily. 1 each 0   Lancets (ONETOUCH DELICA PLUS 123XX123) MISC 4 (four) times daily.     levothyroxine (SYNTHROID) 75 MCG tablet Take 1 tablet by mouth daily at 6 (six) AM. (Patient not taking: Reported on 03/08/2022)     metoprolol succinate (TOPROL-XL) 50 MG 24 hr tablet Take 1 tablet (50 mg total) by mouth daily. Take with or immediately following a meal. 30 tablet 3   mirabegron ER (MYRBETRIQ) 50 MG TB24 tablet Take by mouth.     nitroGLYCERIN (NITROSTAT) 0.4 MG SL tablet PLACE 1 TAB UNDER TONGUE FOR CHEST PAIN EVERY 5 MINUTES     MAXIMUM OF 3 DOSES     CALL 911 AFTER FIRST DOSE.     NOVOLOG FLEXPEN 100 UNIT/ML FlexPen INJECT 9-11 UNITS UNDER THE SKIN BEFORE the 3 meals 30 mL 3   pentosan polysulfate (ELMIRON) 100 MG capsule TAKE 1 CAPSULE (100 MG TOTAL) BY MOUTH 3 TIMES DAILY BEFORE MEALS.     pravastatin (PRAVACHOL) 20 MG tablet TAKE ONE TABLET BY MOUTH ONCE DAILY 15 tablet 0   pravastatin (PRAVACHOL) 20 MG tablet Take 1 tablet by mouth daily. (Patient not taking: Reported on 03/08/2022)     sitaGLIPtin (JANUVIA) 25 MG tablet Take 25 mg by mouth daily.     sucralfate (CARAFATE) 1 g tablet 1 tab(s) crush and mix in water 4 times a day (between meals and at bedtime) for 90 days     SYNTHROID 75 MCG tablet TAKE ONE TABLET BY MOUTH ONCE DAILY 90 tablet 1   torsemide (DEMADEX) 20 MG tablet Take 1 tablet by mouth daily.     triamcinolone (KENALOG) 0.025 % ointment Apply 1 application  topically 2 (two) times daily.     No current facility-administered medications for this visit.     REVIEW OF SYSTEMS:   Constitutional: Denies fevers, chills or night  sweats Eyes: Denies blurriness of vision Ears, nose, mouth, throat, and face: Denies mucositis or sore throat Respiratory: Denies cough, dyspnea or wheezes Cardiovascular: Denies palpitation, chest discomfort or lower extremity swelling Gastrointestinal:  Denies nausea, heartburn or change in bowel habits Skin: Denies abnormal skin rashes Lymphatics: Denies new lymphadenopathy or easy bruising Neurological:Denies numbness, tingling or new weaknesses Behavioral/Psych: Mood is stable, no new changes  All other systems were reviewed with the patient and are negative.  PHYSICAL EXAMINATION: ECOG PERFORMANCE STATUS: 0 - Asymptomatic  Vitals:   05/03/22 1101  BP: (!) 140/69  Pulse: 67  Resp: 18  Temp: 97.8 F (36.6 C)  SpO2: 100%   Filed Weights   05/03/22 1101  Weight: 133 lb 9.6  oz (60.6 kg)    GENERAL:alert, no distress and comfortable  NEURO: alert & oriented x 3 with fluent speech, no focal motor/sensory deficits  LABORATORY DATA:  I have reviewed the data as listed     Component Value Date/Time   NA 140 01/18/2022 1150   NA 138 12/12/2016 1150   K 4.7 01/18/2022 1150   K 4.6 12/12/2016 1150   CL 101 01/18/2022 1150   CL 103 07/27/2012 1259   CO2 21 01/18/2022 1150   CO2 31 (H) 12/12/2016 1150   GLUCOSE 223 (H) 01/18/2022 1150   GLUCOSE 337 (H) 08/14/2021 1043   GLUCOSE 149 (H) 12/12/2016 1150   GLUCOSE 118 (H) 07/27/2012 1259   BUN 28 (H) 01/18/2022 1150   BUN 23.4 12/12/2016 1150   CREATININE 1.61 (H) 01/18/2022 1150   CREATININE 2.07 (H) 08/14/2021 1043   CREATININE 1.99 (H) 07/18/2021 1032   CREATININE 1.7 (H) 12/12/2016 1150   CALCIUM 8.8 01/18/2022 1150   CALCIUM 11.4 (H) 12/12/2016 1150   PROT 6.0 (L) 07/18/2021 1032   PROT 6.5 12/12/2016 1150   ALBUMIN 4.0 04/29/2019 1451   ALBUMIN 3.9 12/12/2016 1150   AST 20 07/18/2021 1032   AST 19 12/12/2016 1150   ALT 14 07/18/2021 1032   ALT 10 12/12/2016 1150   ALKPHOS 68 04/29/2019 1451   ALKPHOS 65  12/12/2016 1150   BILITOT 0.5 07/18/2021 1032   BILITOT 0.36 12/12/2016 1150   GFRNONAA 24 (L) 08/14/2021 1043   GFRNONAA 21 (L) 09/20/2019 1506   GFRAA 25 (L) 09/20/2019 1506    No results found for: "SPEP", "UPEP"  Lab Results  Component Value Date   WBC 6.5 05/03/2022   NEUTROABS 3.1 05/03/2022   HGB 10.4 (L) 05/03/2022   HCT 33.3 (L) 05/03/2022   MCV 86.3 05/03/2022   PLT 171 05/03/2022      Chemistry      Component Value Date/Time   NA 140 01/18/2022 1150   NA 138 12/12/2016 1150   K 4.7 01/18/2022 1150   K 4.6 12/12/2016 1150   CL 101 01/18/2022 1150   CL 103 07/27/2012 1259   CO2 21 01/18/2022 1150   CO2 31 (H) 12/12/2016 1150   BUN 28 (H) 01/18/2022 1150   BUN 23.4 12/12/2016 1150   CREATININE 1.61 (H) 01/18/2022 1150   CREATININE 2.07 (H) 08/14/2021 1043   CREATININE 1.99 (H) 07/18/2021 1032   CREATININE 1.7 (H) 12/12/2016 1150      Component Value Date/Time   CALCIUM 8.8 01/18/2022 1150   CALCIUM 11.4 (H) 12/12/2016 1150   ALKPHOS 68 04/29/2019 1451   ALKPHOS 65 12/12/2016 1150   AST 20 07/18/2021 1032   AST 19 12/12/2016 1150   ALT 14 07/18/2021 1032   ALT 10 12/12/2016 1150   BILITOT 0.5 07/18/2021 1032   BILITOT 0.36 12/12/2016 1150

## 2022-05-03 NOTE — Assessment & Plan Note (Signed)
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

## 2022-05-06 ENCOUNTER — Encounter
Payer: Medicare Other | Attending: Physical Medicine and Rehabilitation | Admitting: Physical Medicine and Rehabilitation

## 2022-05-06 DIAGNOSIS — Z794 Long term (current) use of insulin: Secondary | ICD-10-CM | POA: Diagnosis not present

## 2022-05-06 DIAGNOSIS — N184 Chronic kidney disease, stage 4 (severe): Secondary | ICD-10-CM | POA: Diagnosis not present

## 2022-05-06 DIAGNOSIS — M25552 Pain in left hip: Secondary | ICD-10-CM | POA: Diagnosis not present

## 2022-05-06 DIAGNOSIS — E1122 Type 2 diabetes mellitus with diabetic chronic kidney disease: Secondary | ICD-10-CM | POA: Diagnosis not present

## 2022-05-06 NOTE — Progress Notes (Signed)
Subjective:    Patient ID: Abigail Hoover, female    DOB: 1946-02-21, 77 y.o.   MRN: [id number]  HPI An audio/video tele-health visit is felt to be the most appropriate encounter for this patient at this time. This is a follow up tele-visit via phone. The patient is at home. MD is at office. Prior to scheduling this appointment, our staff discussed the limitations of evaluation and management by telemedicine and the availability of in-person appointments. The patient expressed understanding and agreed to proceed.   Abigail Hoover is a 77 year old woman who presents for f/u of left hip pain  1) Chronic back pain s/p fractures -pain has been stable since fall in June 2021 -she was receiving injections -she was ordered a back brace but this does not help -she was taking Forteo and Prolia  -pain is sometimes 5-8/10 -she cannot bend forward -she gets pain when walking -daughter asks about turmeric, in what form she should take it, how to avoid heavy metals with it, whether to use black pepper with it -daughter prefers to pursue non-medication management  2) s/p hip replacement, left side -gets pain in left side -her niece asks how much turmeric, black pepper, and olive oil she should take daily  3) CKD: -cannot take NSAIDs -daughter asks about recent creatinine level and she notes that most recent creatinine was 1.6  4) Type 2 Diabetes -CBGs have been elevated -she takes 4-5 units of Novolog with breakfast and lunch and 6-7U with dinner -she usually eats dinner at 6:30-7pm -she does not drink drinks with added sugar -she has 4-5 spoons of rice with dinner  Pain Inventory Average Pain 8 Pain Right Now 7 My pain is constant and aching  In the last 24 hours, has pain interfered with the following? General activity 8 Relation with others 8 Enjoyment of life 8 What TIME of day is your pain at its worst? morning , daytime, evening, and night Sleep (in general) Fair  Pain is worse  with: walking, bending, sitting, and inactivity Pain improves with: rest Relief from Meds:  na  walk without assistance how many minutes can you walk? 5 ability to climb steps?  no do you drive?  no  retired  No problems in this area  New pt  New pt    Family History  Problem Relation Age of Onset   Heart disease Mother        heart attack   Stroke Father        brain hemorrhage   Diabetes Brother    Breast cancer Sister    Cancer Sister        breast cancer   Social History   Socioeconomic History   Marital status: Widowed    Spouse name: Not on file   Number of children: 1   Years of education: college   Highest education level: Not on file  Occupational History   Occupation: Homemaker  Tobacco Use   Smoking status: Never    Passive exposure: Never   Smokeless tobacco: Never  Vaping Use   Vaping Use: Never used  Substance and Sexual Activity   Alcohol use: No    Alcohol/week: 0.0 standard drinks of alcohol   Drug use: No   Sexual activity: Not Currently  Other Topics Concern   Not on file  Social History Narrative   Patient is married with one child.   Patient is right handed.   Patient has college education.  Patient drinks tea, two times a day   Social Determinants of Health   Financial Resource Strain: Not on file  Food Insecurity: Not on file  Transportation Needs: Not on file  Physical Activity: Not on file  Stress: Not on file  Social Connections: Not on file   Past Surgical History:  Procedure Laterality Date   ABDOMINAL HYSTERECTOMY     TAH   BREAST BIOPSY     Left breast biopsy benign lesion   BREAST BIOPSY  03/11/2012   Procedure: BREAST BIOPSY;  Surgeon: Odis Hollingshead, MD;  Location: [place];  Service: General;  Laterality: Left;  remove left breast mass   BREAST EXCISIONAL BIOPSY Left    CARDIAC CATHETERIZATION N/A 01/02/2015   Procedure: Left Heart Cath and Coronary Angiography;  Surgeon: Charolette Forward, MD;  Location: [facility];  Service: Cardiovascular;  Laterality: N/A;   CATARACT EXTRACTION     CERVICAL DISC SURGERY     COLONOSCOPY  2013   neg. next 2023.    EYE SURGERY     IR KYPHO EA ADDL LEVEL THORACIC OR LUMBAR  09/09/2019   IR KYPHO LUMBAR INC FX REDUCE BONE BX UNI/BIL CANNULATION INC/IMAGING  09/09/2019   IR RADIOLOGIST EVAL & MGMT  09/08/2019   LEFT HIP SURGERY     TOTAL HIP ARTHROPLASTY Left 03/02/2016   Procedure: TOTAL HIP ARTHROPLASTY ANTERIOR APPROACH;  Surgeon: Rod Can, MD;  Location: [facility];  Service: Orthopedics;  Laterality: Left;   Past Medical History:  Diagnosis Date   Anemia in chronic kidney disease 05/01/2015   Anxiety    Arthritis    Bladder irritation    Diabetes mellitus    GERD (gastroesophageal reflux disease)    Hypercholesterolemia    Hypertension    Hypothyroidism    Iron deficiency anemia, unspecified    Psoriasis    Transfusion history 03/05/2016   for postoperative (ORIF) anemia superimposed on chronic anemia   There were no vitals taken for this visit.  Opioid Risk Score:   Fall Risk Score:  `1  Depression screen [facility] 2/9     02/04/2022   10:38 AM 11/30/2021   10:47 AM  Depression screen PHQ 2/9  Decreased Interest 0 0  Down, Depressed, Hopeless 0 0  PHQ - 2 Score 0 0  Altered sleeping  0  Tired, decreased energy  0  Change in appetite  0  Feeling bad or failure about yourself   0  Trouble concentrating  0  Moving slowly or fidgety/restless  0  Suicidal thoughts  0  PHQ-9 Score  0  Difficult doing work/chores  Not difficult at all     Review of Systems  Musculoskeletal:  Positive for back pain.  All other systems reviewed and are negative.      Objective:   Physical Exam Not performed as patient was seen via phone     Assessment & Plan:   1) Compression fracture: -referred to PT for core and paraspinal strengthening, HEP -Vitamin D level and parathyroid hormone levels ordered and discussed with patient PTH is elevated at  106, discussed that supplementing vitamin D can help to improve these levels, and recommended discussing with her endocrinologist -discussed foods that can increase bone strength and health -recommended cooking her chapatis in ghee or olive oil instead of canola oil -daughter would like her to take additional olive oil as supplement for its anti-inflammatory pain benefits during the day as well: discussed Picual as a brand  that is high in polyphenols, can be purchased online, recommended starting with 1 tsp per day and uptitrating to 2 TB per day for pain relief benefits -recommended 6 prunes per day for bone health and constipation -recommended grinding fresh turmeric and ginger roots into hot water for their anti-inflammatory benefits  2) Hip pain with history of left hip replacement -recommended daily pinch of turmeric, olive oil, ginger, and black pepper sprinkled over her vegetables -discussed that 6 weeks of turmeric use shows similar pain reduction to tylenol and NSAID use  3) CKD: -discussed that creatinine is 1.7 -recommended that she drink 6-8 glasses of water per day  4) Primary OA of bilateral hands -recommended applying blue emu oil on both hands.   5) Type 2 diabetes -commended on avoiding added sugars in the diet -made goal to set limit of rice at dinner time to 4 spoons and to gradually decrease as patient tolerates  29 minutes spent in discussion of trying turmeric, olive oil, ginger and black pepper sprinkled over her vegetables to help reduce inflammation/pain, discussed that 6 weeks of turmeric use shows similar pain reduction to tylenol and NSAIDs, commended on avoiding added sugars in the diet, made foal to set limit of rice at dinner time to 4 spoons and to gradually decrease as patient tolerates, answering other questions

## 2022-05-08 ENCOUNTER — Other Ambulatory Visit: Payer: Self-pay | Admitting: Orthopaedic Surgery

## 2022-05-08 DIAGNOSIS — M489 Spondylopathy, unspecified: Secondary | ICD-10-CM | POA: Diagnosis not present

## 2022-05-08 DIAGNOSIS — M5416 Radiculopathy, lumbar region: Secondary | ICD-10-CM | POA: Diagnosis not present

## 2022-05-08 DIAGNOSIS — M810 Age-related osteoporosis without current pathological fracture: Secondary | ICD-10-CM | POA: Diagnosis not present

## 2022-05-09 ENCOUNTER — Ambulatory Visit (HOSPITAL_COMMUNITY)
Admission: RE | Admit: 2022-05-09 | Discharge: 2022-05-09 | Disposition: A | Discharge status: Home / Self Care | Payer: Medicare Other | Source: Ambulatory Visit | Attending: Gastroenterology | Admitting: Gastroenterology

## 2022-05-09 DIAGNOSIS — R1031 Right lower quadrant pain: Secondary | ICD-10-CM | POA: Insufficient documentation

## 2022-05-09 DIAGNOSIS — R109 Unspecified abdominal pain: Secondary | ICD-10-CM | POA: Diagnosis not present

## 2022-05-13 DIAGNOSIS — M1712 Unilateral primary osteoarthritis, left knee: Secondary | ICD-10-CM | POA: Diagnosis not present

## 2022-05-14 ENCOUNTER — Other Ambulatory Visit: Payer: Self-pay | Admitting: Physical Medicine and Rehabilitation

## 2022-05-14 ENCOUNTER — Other Ambulatory Visit: Payer: Self-pay | Admitting: Rheumatology

## 2022-05-14 NOTE — Telephone Encounter (Signed)
Next Visit: Due June 2024. Message sent to the front to schedule.   Last Visit: 03/08/2022  Last Fill: 02/18/2022  DX: Chondrocalcinosis   Current Dose per office note 03/08/2022: colchicine 0.41m by mouth daily.   Labs: 05/03/2022 RBC 3.86, Hgb 10.4, Hct 33.3, Eosinophils Absolute 1.2  Okay to refill Colchicine?

## 2022-05-14 NOTE — Telephone Encounter (Signed)
Please schedule patient a follow up visit. Patient due June 2024. Thanks!

## 2022-05-21 ENCOUNTER — Telehealth: Payer: Self-pay

## 2022-05-21 DIAGNOSIS — E1142 Type 2 diabetes mellitus with diabetic polyneuropathy: Secondary | ICD-10-CM | POA: Diagnosis not present

## 2022-05-21 DIAGNOSIS — N184 Chronic kidney disease, stage 4 (severe): Secondary | ICD-10-CM | POA: Diagnosis not present

## 2022-05-21 DIAGNOSIS — E039 Hypothyroidism, unspecified: Secondary | ICD-10-CM | POA: Diagnosis not present

## 2022-05-21 DIAGNOSIS — Z794 Long term (current) use of insulin: Secondary | ICD-10-CM | POA: Diagnosis not present

## 2022-05-21 DIAGNOSIS — E1122 Type 2 diabetes mellitus with diabetic chronic kidney disease: Secondary | ICD-10-CM | POA: Diagnosis not present

## 2022-05-21 DIAGNOSIS — Z7984 Long term (current) use of oral hypoglycemic drugs: Secondary | ICD-10-CM | POA: Diagnosis not present

## 2022-05-21 NOTE — Telephone Encounter (Signed)
Per Rinaldo Ratel, current Prior Authorization is expiring.  Submitted a Prior Authorization request to [facility] for PROLIA via CoverMyMeds. Will update once we receive a response.   Key: [id number]

## 2022-05-22 ENCOUNTER — Telehealth: Payer: Self-pay | Admitting: *Deleted

## 2022-05-22 NOTE — Telephone Encounter (Signed)
Received a call from [place] from [facility] following up on a prior authorization request. She would like to clarify if the patient would be using a bisphosphonate, or Forteo or Tymlos in combination with the Prolia. She is requesting a call back at [phone].

## 2022-05-23 NOTE — Telephone Encounter (Signed)
Returned call to [employer] regarding Prolia. Patient is not on any other osteoporosis treatment. Provided necessary clinical information. They will process PA.  Knox Saliva, PharmD, MPH, BCPS, CPP Clinical Pharmacist (Rheumatology and Pulmonology)

## 2022-05-23 NOTE — Telephone Encounter (Signed)
Received notification from [facility] regarding a prior authorization for [place]. Authorization has been APPROVED from 05/23/22 to 05/24/23. Approval letter sent to scan center.  Authorization # [id number]  [employer] updated  Knox Saliva, PharmD, MPH, BCPS, CPP Clinical Pharmacist (Rheumatology and Pulmonology)

## 2022-05-27 DIAGNOSIS — N3010 Interstitial cystitis (chronic) without hematuria: Secondary | ICD-10-CM | POA: Diagnosis not present

## 2022-05-31 DIAGNOSIS — E1122 Type 2 diabetes mellitus with diabetic chronic kidney disease: Secondary | ICD-10-CM | POA: Diagnosis not present

## 2022-06-06 ENCOUNTER — Ambulatory Visit
Admission: RE | Admit: 2022-06-06 | Discharge: 2022-06-06 | Disposition: A | Discharge status: Home / Self Care | Payer: Medicare Other | Source: Ambulatory Visit | Attending: Orthopaedic Surgery | Admitting: Orthopaedic Surgery

## 2022-06-06 DIAGNOSIS — M545 Low back pain, unspecified: Secondary | ICD-10-CM | POA: Diagnosis not present

## 2022-06-06 DIAGNOSIS — M48061 Spinal stenosis, lumbar region without neurogenic claudication: Secondary | ICD-10-CM | POA: Diagnosis not present

## 2022-06-06 DIAGNOSIS — M5416 Radiculopathy, lumbar region: Secondary | ICD-10-CM

## 2022-06-10 ENCOUNTER — Other Ambulatory Visit: Payer: Self-pay

## 2022-06-19 DIAGNOSIS — I10 Essential (primary) hypertension: Secondary | ICD-10-CM | POA: Diagnosis not present

## 2022-06-19 DIAGNOSIS — M810 Age-related osteoporosis without current pathological fracture: Secondary | ICD-10-CM | POA: Diagnosis not present

## 2022-06-19 DIAGNOSIS — M5416 Radiculopathy, lumbar region: Secondary | ICD-10-CM | POA: Diagnosis not present

## 2022-06-20 ENCOUNTER — Other Ambulatory Visit (HOSPITAL_COMMUNITY): Payer: Self-pay

## 2022-06-24 DIAGNOSIS — N184 Chronic kidney disease, stage 4 (severe): Secondary | ICD-10-CM | POA: Diagnosis not present

## 2022-06-27 DIAGNOSIS — L84 Corns and callosities: Secondary | ICD-10-CM | POA: Diagnosis not present

## 2022-06-27 DIAGNOSIS — E1169 Type 2 diabetes mellitus with other specified complication: Secondary | ICD-10-CM | POA: Diagnosis not present

## 2022-06-27 DIAGNOSIS — E039 Hypothyroidism, unspecified: Secondary | ICD-10-CM | POA: Diagnosis not present

## 2022-06-27 DIAGNOSIS — I10 Essential (primary) hypertension: Secondary | ICD-10-CM | POA: Diagnosis not present

## 2022-06-28 ENCOUNTER — Other Ambulatory Visit (HOSPITAL_COMMUNITY): Payer: Self-pay

## 2022-07-01 DIAGNOSIS — E1122 Type 2 diabetes mellitus with diabetic chronic kidney disease: Secondary | ICD-10-CM | POA: Diagnosis not present

## 2022-07-02 ENCOUNTER — Ambulatory Visit: Payer: Medicare Other | Admitting: Podiatry

## 2022-07-02 ENCOUNTER — Telehealth: Payer: Self-pay

## 2022-07-02 VITALS — BP 163/68

## 2022-07-02 DIAGNOSIS — R1031 Right lower quadrant pain: Secondary | ICD-10-CM | POA: Insufficient documentation

## 2022-07-02 DIAGNOSIS — N2581 Secondary hyperparathyroidism of renal origin: Secondary | ICD-10-CM | POA: Diagnosis not present

## 2022-07-02 DIAGNOSIS — M79674 Pain in right toe(s): Secondary | ICD-10-CM

## 2022-07-02 DIAGNOSIS — I129 Hypertensive chronic kidney disease with stage 1 through stage 4 chronic kidney disease, or unspecified chronic kidney disease: Secondary | ICD-10-CM | POA: Diagnosis not present

## 2022-07-02 DIAGNOSIS — K5900 Constipation, unspecified: Secondary | ICD-10-CM | POA: Insufficient documentation

## 2022-07-02 DIAGNOSIS — K5904 Chronic idiopathic constipation: Secondary | ICD-10-CM | POA: Insufficient documentation

## 2022-07-02 DIAGNOSIS — M79675 Pain in left toe(s): Secondary | ICD-10-CM

## 2022-07-02 DIAGNOSIS — L84 Corns and callosities: Secondary | ICD-10-CM

## 2022-07-02 DIAGNOSIS — E1142 Type 2 diabetes mellitus with diabetic polyneuropathy: Secondary | ICD-10-CM | POA: Diagnosis not present

## 2022-07-02 DIAGNOSIS — B351 Tinea unguium: Secondary | ICD-10-CM

## 2022-07-02 DIAGNOSIS — D631 Anemia in chronic kidney disease: Secondary | ICD-10-CM | POA: Diagnosis not present

## 2022-07-02 DIAGNOSIS — N184 Chronic kidney disease, stage 4 (severe): Secondary | ICD-10-CM | POA: Diagnosis not present

## 2022-07-02 DIAGNOSIS — R234 Changes in skin texture: Secondary | ICD-10-CM

## 2022-07-02 NOTE — Patient Instructions (Signed)
Apply Neosporin to right heel once daily. Moisturize heels with AquaPhor Lotion once daily.

## 2022-07-02 NOTE — Telephone Encounter (Signed)
Returned call to daughter and she is making medication list. Asking for the name of injections. Given name of injections that she receives every 3 months.

## 2022-07-02 NOTE — Progress Notes (Signed)
  Subjective:  Patient ID: Abigail Hoover, female    DOB: [birth year],  MRN: [id number]  Cherre Blanc presents to clinic today for at risk foot care with history of diabetic neuropathy and painful elongated overgrown toenails which are tender when wearing enclosed shoe gear.  Chief Complaint  Patient presents with   Nail Problem    DFC/painful cracked heels  BS-[id number] A1C-7.1 PCP-Jay Patel PCP VST-Early 2024   New problem(s):  Patient presents with her grandson who states they are concerned about cracked skin on her heels. Area is painful at times and they would like guidance for treatment.  PCP is Pollyann Savoy, MD.  Allergies  Allergen Reactions   Atorvastatin Rash and Other (See Comments)   Ezetimibe Itching and Other (See Comments)    Review of Systems: Negative except as noted in the HPI.  Objective: No changes noted in today's physical examination. Vitals:   07/02/22 1049  BP: (!) 163/68   Abigail Hoover is a pleasant 77 y.o. female WD, WN in NAD. AAO x 3.  Vascular Examination: Neurovascular status unchanged b/l lower extremities. Capillary refill time to digits immediate b/l. Palpable DP pulses b/l. Palpable PT pulses b/l. Pedal hair sparse b/l. Skin temperature gradient within normal limits b/l. No edema noted b/l.  Dermatological Examination: Pedal skin with normal turgor, texture and tone bilaterally. No open wounds bilaterally. No interdigital macerations bilaterally. Toenails 1-5 b/l elongated, discolored, dystrophic, thickened, crumbly with subungual debris and tenderness to dorsal palpation.  Hyperkeratotic lesion(s) bilateral heels.  No erythema, no edema, no drainage, no fluctuance. Longitudinal fissuring of skin noted bilateral heels. Tenderness to palpation. No erythema, no edema, no drainage, no flocculence.  Musculoskeletal: Normal muscle strength 5/5 to all lower extremity muscle groups bilaterally. No pain crepitus or joint limitation noted with ROM  b/l. No gross bony deformities bilaterally. Patient ambulates independent of any assistive aids.  Neurological Examination: Pt has subjective symptoms of neuropathy. Protective sensation intact 5/5 intact bilaterally with 10g monofilament b/l. Vibratory sensation intact b/l. Proprioception intact bilaterally.  Assessment/Plan: 1. Pain due to onychomycosis of toenails of both feet   2. Fissure in skin of both feet   3. Heel callus   4. Diabetic peripheral neuropathy associated with type 2 diabetes mellitus     -Patient's family member present. All questions/concerns addressed on today's visit. -Discussed heel fissures/calluses. Advised applying Neosporin to right heel once daily and moisturize both feet with Aquaphor Lotion once daily. Advised wearing shoe gear with heel counter and avoid long-term wear of sandals or flip flop type shoes which expose heels to air. -Patient to continue soft, supportive shoe gear daily. -Toenails 1-5 b/l were debrided in length and girth with sterile nail nippers and dremel without iatrogenic bleeding.  -Callus(es) bilateral heels pared utilizing rotary bur without complication or incident. Total number pared =2. -Patient/POA to call should there be question/concern in the interim.   Return in about 3 months (around 10/01/2022).  Freddie Breech, DPM

## 2022-07-03 ENCOUNTER — Telehealth: Payer: Self-pay | Admitting: Podiatry

## 2022-07-03 NOTE — Telephone Encounter (Signed)
Pt called stating she needed to be seen asap that wants someone to look at her leg but her english is not good so I asked if I could call pts daughter and she said yes.  I spoke to pts daughter [name] and she is having pain in her feet when walking and would like to see Dr Allena Katz. I have scheduled pt with Dr Allena Katz on 4.24.2025 in [place] office.

## 2022-07-04 ENCOUNTER — Other Ambulatory Visit (HOSPITAL_COMMUNITY): Payer: Self-pay

## 2022-07-04 DIAGNOSIS — M810 Age-related osteoporosis without current pathological fracture: Secondary | ICD-10-CM | POA: Diagnosis not present

## 2022-07-04 DIAGNOSIS — M5416 Radiculopathy, lumbar region: Secondary | ICD-10-CM | POA: Diagnosis not present

## 2022-07-06 ENCOUNTER — Encounter: Payer: Self-pay | Admitting: Podiatry

## 2022-07-10 DIAGNOSIS — I872 Venous insufficiency (chronic) (peripheral): Secondary | ICD-10-CM | POA: Diagnosis not present

## 2022-07-10 DIAGNOSIS — L858 Other specified epidermal thickening: Secondary | ICD-10-CM | POA: Diagnosis not present

## 2022-07-12 ENCOUNTER — Ambulatory Visit: Payer: Medicare Other | Admitting: Podiatry

## 2022-07-16 ENCOUNTER — Encounter: Payer: Self-pay | Admitting: Hematology and Oncology

## 2022-07-16 ENCOUNTER — Telehealth: Payer: Self-pay | Admitting: Pharmacist

## 2022-07-16 ENCOUNTER — Other Ambulatory Visit (HOSPITAL_COMMUNITY): Payer: Self-pay

## 2022-07-16 DIAGNOSIS — Z79899 Other long term (current) drug therapy: Secondary | ICD-10-CM

## 2022-07-16 DIAGNOSIS — M8000XS Age-related osteoporosis with current pathological fracture, unspecified site, sequela: Secondary | ICD-10-CM

## 2022-07-16 DIAGNOSIS — Z5181 Encounter for therapeutic drug level monitoring: Secondary | ICD-10-CM

## 2022-07-16 NOTE — Telephone Encounter (Signed)
Patient overdue for Prolia - was due on 07/01/22. She needs updated CMET and CBC before proceeding. Called patient's daughter, [name]. She will come to get labs done this week.  Advised of Prolia copay of $416.79. [name] said they will this.  Chesley Mires, PharmD, MPH, BCPS, CPP Clinical Pharmacist (Rheumatology and Pulmonology)

## 2022-07-17 ENCOUNTER — Ambulatory Visit: Payer: Medicare Other | Admitting: Podiatry

## 2022-07-17 DIAGNOSIS — M25552 Pain in left hip: Secondary | ICD-10-CM | POA: Diagnosis not present

## 2022-07-17 DIAGNOSIS — M5451 Vertebrogenic low back pain: Secondary | ICD-10-CM | POA: Diagnosis not present

## 2022-07-17 DIAGNOSIS — R293 Abnormal posture: Secondary | ICD-10-CM | POA: Diagnosis not present

## 2022-07-18 DIAGNOSIS — I10 Essential (primary) hypertension: Secondary | ICD-10-CM | POA: Diagnosis not present

## 2022-07-18 DIAGNOSIS — E1169 Type 2 diabetes mellitus with other specified complication: Secondary | ICD-10-CM | POA: Diagnosis not present

## 2022-07-18 DIAGNOSIS — E039 Hypothyroidism, unspecified: Secondary | ICD-10-CM | POA: Diagnosis not present

## 2022-07-18 DIAGNOSIS — N182 Chronic kidney disease, stage 2 (mild): Secondary | ICD-10-CM | POA: Diagnosis not present

## 2022-07-18 NOTE — Telephone Encounter (Signed)
[phone] [name], patient's daughter requesting a return phone call.

## 2022-07-19 NOTE — Telephone Encounter (Signed)
I returned call to patient's daughter, [name]. She states patient states she is not feeling improvement from Prolia and wants to know if Prolia is still needed. I advised that patient was initially placed on Forteo to build her bone s/p vertebral fractures. However after completed 2 years of Forteo treatment, she had to be transitioned to Prolia. Her treatment options for osteoporosis are limited due to her kidney function.  She verbalized understanding. Patient will stop by next week for labs  Chesley Mires, PharmD, MPH, BCPS, CPP Clinical Pharmacist (Rheumatology and Pulmonology)

## 2022-07-23 ENCOUNTER — Other Ambulatory Visit: Payer: Self-pay

## 2022-07-23 ENCOUNTER — Other Ambulatory Visit: Payer: Self-pay | Admitting: *Deleted

## 2022-07-23 DIAGNOSIS — Z79899 Other long term (current) drug therapy: Secondary | ICD-10-CM

## 2022-07-23 DIAGNOSIS — Z5181 Encounter for therapeutic drug level monitoring: Secondary | ICD-10-CM | POA: Diagnosis not present

## 2022-07-23 DIAGNOSIS — M8000XS Age-related osteoporosis with current pathological fracture, unspecified site, sequela: Secondary | ICD-10-CM | POA: Diagnosis not present

## 2022-07-23 MED ORDER — NITROGLYCERIN 0.4 MG SL SUBL: SUBLINGUAL_TABLET | SUBLINGUAL | 0 refills | Status: DC

## 2022-07-23 NOTE — Telephone Encounter (Signed)
Spoke w/ patient - she plans to come for labwork today  Chesley Mires, PharmD, MPH, BCPS, CPP Clinical Pharmacist (Rheumatology and Pulmonology)

## 2022-07-23 NOTE — Telephone Encounter (Signed)
Prolia injections are every 6 months.  They should get labs prior to the Prolia injection which should include CBC, CMP and vitamin D.

## 2022-07-23 NOTE — Telephone Encounter (Signed)
Patient's daughter left voicemail asking is patient needs Prolia? [phone].

## 2022-07-24 LAB — CBC WITH DIFFERENTIAL/PLATELET
Absolute Monocytes: 447 cells/uL (ref 200–950)
Basophils Absolute: 17 cells/uL (ref 0–200)
Basophils Relative: 0.3 %
Eosinophils Absolute: 754 cells/uL — ABNORMAL HIGH (ref 15–500)
Eosinophils Relative: 13 %
HCT: 32 % — ABNORMAL LOW (ref 35.0–45.0)
Hemoglobin: 9.9 g/dL — ABNORMAL LOW (ref 11.7–15.5)
Lymphs Abs: 1560 cells/uL (ref 850–3900)
MCH: 26.4 pg — ABNORMAL LOW (ref 27.0–33.0)
MCHC: 30.9 g/dL — ABNORMAL LOW (ref 32.0–36.0)
MCV: 85.3 fL (ref 80.0–100.0)
MPV: 12.4 fL (ref 7.5–12.5)
Monocytes Relative: 7.7 %
Neutro Abs: 3022 cells/uL (ref 1500–7800)
Neutrophils Relative %: 52.1 %
Platelets: 170 10*3/uL (ref 140–400)
RBC: 3.75 10*6/uL — ABNORMAL LOW (ref 3.80–5.10)
RDW: 13.6 % (ref 11.0–15.0)
Total Lymphocyte: 26.9 %
WBC: 5.8 10*3/uL (ref 3.8–10.8)

## 2022-07-24 LAB — COMPREHENSIVE METABOLIC PANEL
AG Ratio: 1.9 (calc) (ref 1.0–2.5)
ALT: 6 U/L (ref 6–29)
AST: 14 U/L (ref 10–35)
Albumin: 4 g/dL (ref 3.6–5.1)
Alkaline phosphatase (APISO): 49 U/L (ref 37–153)
BUN/Creatinine Ratio: 16 (calc) (ref 6–22)
BUN: 37 mg/dL — ABNORMAL HIGH (ref 7–25)
CO2: 30 mmol/L (ref 20–32)
Calcium: 9.4 mg/dL (ref 8.6–10.4)
Chloride: 97 mmol/L — ABNORMAL LOW (ref 98–110)
Creat: 2.31 mg/dL — ABNORMAL HIGH (ref 0.60–1.00)
Globulin: 2.1 g/dL (calc) (ref 1.9–3.7)
Glucose, Bld: 142 mg/dL — ABNORMAL HIGH (ref 65–99)
Potassium: 4.2 mmol/L (ref 3.5–5.3)
Sodium: 137 mmol/L (ref 135–146)
Total Bilirubin: 0.4 mg/dL (ref 0.2–1.2)
Total Protein: 6.1 g/dL (ref 6.1–8.1)

## 2022-07-24 NOTE — Progress Notes (Signed)
Hemoglobin is 9.9.  CMP showed glucose elevated at 142, creatinine 2.31.  Please forward results to patient's PCP and nephrologist.

## 2022-07-24 NOTE — Telephone Encounter (Signed)
Advised [name], Prolia injections are every 6 months.  They should get labs prior to the Prolia injection.

## 2022-07-25 ENCOUNTER — Encounter: Payer: Self-pay | Admitting: Hematology and Oncology

## 2022-07-25 NOTE — Telephone Encounter (Signed)
Labs stable to proceed with Prolia. ATC patient's daughter to schedule Prolia appt. Unable to reach. Left VM  Chesley Mires, PharmD, MPH, BCPS, CPP Clinical Pharmacist (Rheumatology and Pulmonology)

## 2022-07-26 DIAGNOSIS — R293 Abnormal posture: Secondary | ICD-10-CM | POA: Diagnosis not present

## 2022-07-26 DIAGNOSIS — M25552 Pain in left hip: Secondary | ICD-10-CM | POA: Diagnosis not present

## 2022-07-26 DIAGNOSIS — M5451 Vertebrogenic low back pain: Secondary | ICD-10-CM | POA: Diagnosis not present

## 2022-07-29 ENCOUNTER — Telehealth: Payer: Self-pay

## 2022-07-29 ENCOUNTER — Ambulatory Visit: Payer: Medicare Other | Admitting: Cardiology

## 2022-07-29 ENCOUNTER — Encounter: Payer: Self-pay | Admitting: Cardiology

## 2022-07-29 VITALS — BP 134/65 | HR 71 | Resp 16 | Ht <= 58 in | Wt 131.4 lb

## 2022-07-29 DIAGNOSIS — R600 Localized edema: Secondary | ICD-10-CM | POA: Diagnosis not present

## 2022-07-29 NOTE — Telephone Encounter (Signed)
Patient's daughter [name] contacted the office to inquire about the patient's Prolia. [name] states she is returning a call to the Pharmacist. [name] states she could not clearly make out the voicemail that was left by the Pharmacist. The call back number is [phone]. Please advise.

## 2022-07-29 NOTE — Progress Notes (Signed)
Patient referred by Pollyann Savoy, MD for chest pain  Subjective:   Abigail Hoover, female    DOB: May 25, 1945, 77 y.o.   MRN: [id number]   Chief Complaint  Patient presents with   Chest Pain   Follow-up     HPI  77 year old [place] American female with hypertension, type 2 diabetes mellitus, hypothyroidism, CKD stage 3b, anemia, leg edema  Patient is here today with her son and daughter-in-law.  Patient has had increasing swelling in both her legs, with superficial skin blisters, no open wounds or injury at this time.  She is unable to wear compression stockings due to mobility limitations.    Initial consultation HPI 12/2021: Patient reports episodes of retrosternal chest pain that is particularly brought upon by emotional stress (when she cries thinking about her deceased husband who passed in early 2023). These episodes last for a few minutes and subside on their own. She does not have any exertional chest pain. That said, her physical activity is limited to walking around the house. Physical activity is significantly limited due to left hip pain. She did report that she does not walk treadmill because it causes her to have chest pain.   Patient  is currently taking metoprolol succinate and Imdur. She previously saw Dr. Sharyn Lull in 2016 andr Ganji in 2017. Prior workup showed normal EF, no ischemia on stress testing, cardiac catheterization with mild (10-20%) nonobstructive disease.      Current Outpatient Medications:    aspirin EC 81 MG tablet, Take 81 mg by mouth daily. Take 1 tablet by mouth once daily., Disp: , Rfl:    furosemide (LASIX) 40 MG tablet, Take 1 tablet by mouth 2 (two) times daily., Disp: , Rfl:    isosorbide mononitrate (IMDUR) 120 MG 24 hr tablet, Take 1 tablet by mouth every morning., Disp: , Rfl:    metoprolol succinate (TOPROL-XL) 50 MG 24 hr tablet, Take 1 tablet (50 mg total) by mouth daily. Take with or immediately following a meal., Disp: 30  tablet, Rfl: 3   mirabegron ER (MYRBETRIQ) 50 MG TB24 tablet, Take by mouth., Disp: , Rfl:    NOVOLOG FLEXPEN 100 UNIT/ML FlexPen, INJECT 9-11 UNITS UNDER THE SKIN BEFORE the 3 meals, Disp: 30 mL, Rfl: 3   pentosan polysulfate (ELMIRON) 100 MG capsule, TAKE 1 CAPSULE (100 MG TOTAL) BY MOUTH 3 TIMES DAILY BEFORE MEALS., Disp: , Rfl:    sitaGLIPtin (JANUVIA) 25 MG tablet, Take 25 mg by mouth daily., Disp: , Rfl:    SYNTHROID 75 MCG tablet, TAKE ONE TABLET BY MOUTH ONCE DAILY, Disp: 90 tablet, Rfl: 1   torsemide (DEMADEX) 20 MG tablet, Take 1 tablet by mouth daily., Disp: , Rfl:    Vitamin D, Ergocalciferol, (DRISDOL) 1.25 MG (50000 UNIT) CAPS capsule, TAKE 1 CAPSULE (50,000 UNITS TOTAL) BY MOUTH EVERY 7 (SEVEN) DAYS., Disp: 20 capsule, Rfl: 0   Cholecalciferol (VITAMIN D3) 1000 units CAPS, Vitamin D3 (Patient not taking: Reported on 07/29/2022), Disp: , Rfl:    colchicine 0.6 MG tablet, TAKE ONE-HALF TABLET (0.3 MG TOTAL) BY MOUTH DAILY., Disp: 15 tablet, Rfl: 2   Continuous Blood Gluc Sensor (DEXCOM G7 SENSOR) MISC, Inject 1 sensor to the skin every 10 days for continuous glucose monitoring., Disp: , Rfl:    gabapentin (NEURONTIN) 300 MG capsule, Take 300 mg by mouth 3 (three) times daily., Disp: , Rfl:    glucose blood (ONETOUCH VERIO) test strip, Use Onetouch verio test strips to check blood sugar three  times daily. (90 day RX), Disp: 300 each, Rfl: 2   hydrocortisone 2.5 % ointment, APPLY TO AFFECED ARES(S) TOPICALLY TWICE DAILY AS NEEDED (Patient not taking: Reported on 03/08/2022), Disp: , Rfl:    Insulin Pen Needle (PEN NEEDLES 3/16") 31G X 5 MM MISC, Use 1 pen needle to inject Forteo daily., Disp: 100 each, Rfl: 2   Insulin Pen Needle 31G X 5 MM MISC, Use with Novolog, Disp: 100 each, Rfl: 1   Insulin Syringe-Needle U-100 25G X 5/8" 1 ML MISC, USE DAILY TO INJECT INSULIN, Disp: 100 each, Rfl: 3   lactulose (CHRONULAC) 10 GM/15ML solution, Take 20 g by mouth daily. (Patient not taking: Reported  on 03/08/2022), Disp: , Rfl:    Lancet Devices (ONE TOUCH DELICA LANCING DEV) MISC, 1 each by Does not apply route daily. Use as instructed to check blood sugar once daily., Disp: 1 each, Rfl: 0   Lancets (ONETOUCH DELICA PLUS LANCET33G) MISC, 4 (four) times daily., Disp: , Rfl:    levothyroxine (SYNTHROID) 75 MCG tablet, Take 1 tablet by mouth daily at 6 (six) AM. (Patient not taking: Reported on 03/08/2022), Disp: , Rfl:    nitroGLYCERIN (NITROSTAT) 0.4 MG SL tablet, PLACE 1 TAB UNDER TONGUE FOR CHEST PAIN EVERY 5 MINUTES     MAXIMUM OF 3 DOSES     CALL 911 AFTER FIRST DOSE., Disp: 25 tablet, Rfl: 0   pravastatin (PRAVACHOL) 20 MG tablet, TAKE ONE TABLET BY MOUTH ONCE DAILY, Disp: 15 tablet, Rfl: 0   pravastatin (PRAVACHOL) 20 MG tablet, Take 1 tablet by mouth daily. (Patient not taking: Reported on 03/08/2022), Disp: , Rfl:    sucralfate (CARAFATE) 1 g tablet, 1 tab(s) crush and mix in water 4 times a day (between meals and at bedtime) for 90 days, Disp: , Rfl:    triamcinolone (KENALOG) 0.025 % ointment, Apply 1 application  topically 2 (two) times daily., Disp: , Rfl:    Cardiovascular and other pertinent studies:  Reviewed external labs and tests, independently interpreted  Lexiscan Nuclear stress test 02/12/2022: Myocardial perfusion is normal. Overall LV systolic function is normal without regional wall motion abnormalities. Stress LV EF: 73%.  Nondiagnostic ECG stress. The heart rate response was consistent with Regadenoson.  No previous exam available for comparison. Low risk study.   Echocardiogram 01/21/2022:  Normal LV systolic function with visual EF 55-60%. Left ventricle cavity  is normal in size. Normal left ventricular wall thickness. Normal global  wall motion. Doppler evidence of grade I (impaired) diastolic dysfunction,  normal LAP. Calculated EF 56%.  Native mitral valve.  Mild (Grade I) mitral regurgitation.  Structurally normal tricuspid valve with no regurgitation.  No evidence of  pulmonary hypertension.  No prior available for comparison.   EKG 01/16/2022: Probable sinus rhythm 55 bpm Baseline artifact  Coronary angiography 12/2023 (Dr. Sharyn Lull): Mid LAD lesion, 10% stenosed. Dist Cx lesion, 20% stenosed. Mid RCA lesion, 20% stenosed. The left ventricular systolic function is normal.  Recent labs: 01/31/2022: H/H 9.4/29.3. MCV 88. Platelets 208  01/18/2022: Glucose 223, BUN/Cr 28/1.61. EGFR 33. Na/K 140/4.7.  Chol 150, TG 212, HDL 49, LDL 66  11/30/2021: Glucose 295, BUN/Cr 36/1.70. EGFR 31. Na/K 143/4.4.  H/H 10.8/24.6. MCV 87. Platelets 169 (07/2021) HbA1C 7.0%   Review of Systems  Cardiovascular:  Positive for leg swelling. Negative for chest pain, dyspnea on exertion, palpitations and syncope.  Musculoskeletal:  Positive for joint pain.         Vitals:   07/29/22 1523  BP: 134/65  Pulse: 71  Resp: 16  SpO2: 95%     Body mass index is 29.46 kg/m. Filed Weights   07/29/22 1523  Weight: 131 lb 6.4 oz (59.6 kg)     Objective:   Physical Exam Vitals and nursing note reviewed.  Constitutional:      General: She is not in acute distress. Neck:     Vascular: No JVD.  Cardiovascular:     Rate and Rhythm: Normal rate and regular rhythm.     Heart sounds: Normal heart sounds. No murmur heard. Pulmonary:     Effort: Pulmonary effort is normal.     Breath sounds: Normal breath sounds. No wheezing or rales.  Musculoskeletal:     Right lower leg: Edema (1+) present.     Left lower leg: Edema (1+) present.          Visit diagnoses:   ICD-10-CM   1. Leg edema  R60.0 Ambulatory referral to Vascular Surgery        Orders Placed This Encounter  Procedures   Ambulatory referral to Vascular Surgery     Assessment & Recommendations:   77 year old [place] American female with hypertension, type 2 diabetes mellitus, hypothyroidism, CKD stage 3b, anemia, leg edema  Leg edema: Superficial varicosities and healed  skin blisters.  Suspect venous insufficiency. Prescribed compression stockings, encouraged to use as much as possible during daytime.  Elevate legs at nighttime. In addition, will refer to vascular surgery for future consideration of endovenous ablation, if venous insufficiency is confirmed on ultrasound.   Chest pain: Improved. No ischemia on stress testing. No severe abnormalities on echocardiogram (12/2021). Continue Aspirin, stain.  Continue torsemide 20 mg daily for now.  No indication for invasive workup. Continue management for anemia.  F/u in 6 months   Elder Negus, MD Pager: [phone] Office: [phone]

## 2022-07-31 ENCOUNTER — Other Ambulatory Visit: Payer: Self-pay

## 2022-07-31 ENCOUNTER — Other Ambulatory Visit (HOSPITAL_COMMUNITY): Payer: Self-pay

## 2022-07-31 DIAGNOSIS — E1122 Type 2 diabetes mellitus with diabetic chronic kidney disease: Secondary | ICD-10-CM | POA: Diagnosis not present

## 2022-07-31 MED ORDER — DENOSUMAB 60 MG/ML ~~LOC~~ SOSY
60.0000 mg | PREFILLED_SYRINGE | SUBCUTANEOUS | 0 refills | Status: DC
Start: 2022-07-31 — End: 2023-02-25
  Filled 2022-07-31 (×2): qty 1, 180d supply, fill #0

## 2022-07-31 NOTE — Telephone Encounter (Signed)
Delivery instructions have been updated in [place], medication will be couriered to [facility] on 08/01/22.

## 2022-07-31 NOTE — Telephone Encounter (Signed)
Spoke with Abigail Hoover - she is aware that we can schedule Prolia for 5/14, 5/15, or 5/16. She will call back to schedule  Rx sent to [facility] to be courired to clinic by this date

## 2022-07-31 NOTE — Telephone Encounter (Signed)
Returned call to [name] to schedule Prolia. Unable to reach. Left VM requesting return call 

## 2022-07-31 NOTE — Addendum Note (Signed)
Addended by: Murrell Redden on: 07/31/2022 09:59 AM   Modules accepted: Orders

## 2022-07-31 NOTE — Telephone Encounter (Signed)
Returned call to [name] to schedule Prolia. Unable to reach. Left VM requesting return call

## 2022-08-01 ENCOUNTER — Other Ambulatory Visit (HOSPITAL_COMMUNITY): Payer: Self-pay

## 2022-08-01 ENCOUNTER — Other Ambulatory Visit: Payer: Self-pay

## 2022-08-01 ENCOUNTER — Inpatient Hospital Stay: Payer: Medicare Other

## 2022-08-01 ENCOUNTER — Inpatient Hospital Stay: Payer: Medicare Other | Admitting: Hematology and Oncology

## 2022-08-01 NOTE — Telephone Encounter (Signed)
Patient scheduled for Prolia on 08/06/22  Chesley Mires, PharmD, MPH, BCPS, CPP Clinical Pharmacist (Rheumatology and Pulmonology)

## 2022-08-02 DIAGNOSIS — R293 Abnormal posture: Secondary | ICD-10-CM | POA: Diagnosis not present

## 2022-08-02 DIAGNOSIS — M25552 Pain in left hip: Secondary | ICD-10-CM | POA: Diagnosis not present

## 2022-08-02 DIAGNOSIS — M5451 Vertebrogenic low back pain: Secondary | ICD-10-CM | POA: Diagnosis not present

## 2022-08-02 NOTE — Telephone Encounter (Signed)
Prolia received from [facility]. Placed in fridge 

## 2022-08-06 ENCOUNTER — Ambulatory Visit: Payer: Medicare Other | Attending: Rheumatology | Admitting: Pharmacist

## 2022-08-06 DIAGNOSIS — Z7689 Persons encountering health services in other specified circumstances: Secondary | ICD-10-CM

## 2022-08-06 DIAGNOSIS — M8000XD Age-related osteoporosis with current pathological fracture, unspecified site, subsequent encounter for fracture with routine healing: Secondary | ICD-10-CM

## 2022-08-06 DIAGNOSIS — M8000XS Age-related osteoporosis with current pathological fracture, unspecified site, sequela: Secondary | ICD-10-CM | POA: Diagnosis not present

## 2022-08-06 MED ORDER — DENOSUMAB 60 MG/ML ~~LOC~~ SOSY
60.0000 mg | PREFILLED_SYRINGE | Freq: Once | SUBCUTANEOUS | Status: AC
  Administered 2022-08-06: 60 mg via SUBCUTANEOUS

## 2022-08-06 NOTE — Progress Notes (Addendum)
Pharmacy Note  Subjective:   Patient presents to clinic today to receive bi-annual dose of Prolia. Patient's last dose of Prolia was on 01/02/2022.  She is requesting prescription for back brace.  Patient running a fever or have signs/symptoms of infection? No  Patient currently on antibiotics for the treatment of infection? No  Patient had fall in the last 6 months?  No     Patient taking calcium 1200 mg daily through diet or supplement and at least 800 units vitamin D? Yes  Objective: CMP     Component Value Date/Time   NA 137 07/23/2022 1413   NA 140 01/18/2022 1150   NA 138 12/12/2016 1150   K 4.2 07/23/2022 1413   K 4.6 12/12/2016 1150   CL 97 (L) 07/23/2022 1413   CL 103 07/27/2012 1259   CO2 30 07/23/2022 1413   CO2 31 (H) 12/12/2016 1150   GLUCOSE 142 (H) 07/23/2022 1413   GLUCOSE 149 (H) 12/12/2016 1150   GLUCOSE 118 (H) 07/27/2012 1259   BUN 37 (H) 07/23/2022 1413   BUN 28 (H) 01/18/2022 1150   BUN 23.4 12/12/2016 1150   CREATININE 2.31 (H) 07/23/2022 1413   CREATININE 1.7 (H) 12/12/2016 1150   CALCIUM 9.4 07/23/2022 1413   CALCIUM 11.4 (H) 12/12/2016 1150   PROT 6.1 07/23/2022 1413   PROT 6.5 12/12/2016 1150   ALBUMIN 4.0 04/29/2019 1451   ALBUMIN 3.9 12/12/2016 1150   AST 14 07/23/2022 1413   AST 19 12/12/2016 1150   ALT 6 07/23/2022 1413   ALT 10 12/12/2016 1150   ALKPHOS 68 04/29/2019 1451   ALKPHOS 65 12/12/2016 1150   BILITOT 0.4 07/23/2022 1413   BILITOT 0.36 12/12/2016 1150   GFRNONAA 24 (L) 08/14/2021 1043   GFRNONAA 21 (L) 09/20/2019 1506   GFRAA 25 (L) 09/20/2019 1506    CBC    Component Value Date/Time   WBC 5.8 07/23/2022 1413   RBC 3.75 (L) 07/23/2022 1413   HGB 9.9 (L) 07/23/2022 1413   HGB 10.4 (L) 05/03/2022 1046   HGB 9.0 (L) 01/18/2022 1150   HGB 10.9 (L) 03/21/2017 1221   HCT 32.0 (L) 07/23/2022 1413   HCT 29.4 (L) 01/18/2022 1150   HCT 35.9 03/21/2017 1221   PLT 170 07/23/2022 1413   PLT 171 05/03/2022 1046   PLT 164  01/18/2022 1150   MCV 85.3 07/23/2022 1413   MCV 90 01/18/2022 1150   MCV 82.5 03/21/2017 1221   MCH 26.4 (L) 07/23/2022 1413   MCHC 30.9 (L) 07/23/2022 1413   RDW 13.6 07/23/2022 1413   RDW 13.1 01/18/2022 1150   RDW 15.7 (H) 03/21/2017 1221   LYMPHSABS 1,560 07/23/2022 1413   LYMPHSABS 1.1 03/21/2017 1221   MONOABS 0.3 05/03/2022 1046   MONOABS 0.3 03/21/2017 1221   EOSABS 754 (H) 07/23/2022 1413   EOSABS 0.3 03/21/2017 1221   BASOSABS 17 07/23/2022 1413   BASOSABS 0.0 03/21/2017 1221    Lab Results  Component Value Date   VD25OH 30.2 11/30/2021   T-score: 03/27/22  Assessment/Plan:   Reviewed importance of adequate dietary intake of calcium in addition to supplementation due to risk of hypocalcemia with Prolia.   Patient tolerated injection  without issue.   Administrations This Visit     denosumab (PROLIA) injection 60 mg     Admin Date 08/06/2022 Action Given Dose 60 mg Route Subcutaneous Administered By Murrell Redden, RPH-CPP           Patient's next  Prolia dose is due on 02/02/2023.  Patient is due for updated DEXA in January 2026.  Discussed that Dr. Corliss Skains is willing to write prescription for back brace but patient should not use more than 18min-1 hour daily during activities. Should not be used when inactive as it can contribute to loss of muscle mass. Upon review of this information, patient states she is okay without the rx for back brace. Will let us know in future if interested.   All questions encouraged and answered.  Instructed patient to call with any further questions or concerns.   Chesley Mires, PharmD, MPH, BCPS, CPP Clinical Pharmacist (Rheumatology and Pulmonology)

## 2022-08-08 ENCOUNTER — Inpatient Hospital Stay: Payer: Medicare Other | Admitting: Hematology and Oncology

## 2022-08-08 ENCOUNTER — Encounter: Payer: Self-pay | Admitting: Hematology and Oncology

## 2022-08-08 ENCOUNTER — Inpatient Hospital Stay: Payer: Medicare Other

## 2022-08-08 ENCOUNTER — Inpatient Hospital Stay: Payer: Medicare Other | Attending: Hematology and Oncology

## 2022-08-08 VITALS — BP 138/67 | HR 63 | Temp 97.5°F | Resp 18 | Ht <= 58 in | Wt 129.4 lb

## 2022-08-08 DIAGNOSIS — E538 Deficiency of other specified B group vitamins: Secondary | ICD-10-CM | POA: Insufficient documentation

## 2022-08-08 DIAGNOSIS — D539 Nutritional anemia, unspecified: Secondary | ICD-10-CM | POA: Diagnosis not present

## 2022-08-08 DIAGNOSIS — N189 Chronic kidney disease, unspecified: Secondary | ICD-10-CM | POA: Diagnosis not present

## 2022-08-08 DIAGNOSIS — Z803 Family history of malignant neoplasm of breast: Secondary | ICD-10-CM | POA: Diagnosis not present

## 2022-08-08 DIAGNOSIS — Z79890 Hormone replacement therapy (postmenopausal): Secondary | ICD-10-CM | POA: Insufficient documentation

## 2022-08-08 DIAGNOSIS — D631 Anemia in chronic kidney disease: Secondary | ICD-10-CM | POA: Diagnosis not present

## 2022-08-08 DIAGNOSIS — Z79899 Other long term (current) drug therapy: Secondary | ICD-10-CM | POA: Insufficient documentation

## 2022-08-08 DIAGNOSIS — E611 Iron deficiency: Secondary | ICD-10-CM | POA: Diagnosis not present

## 2022-08-08 DIAGNOSIS — N183 Chronic kidney disease, stage 3 unspecified: Secondary | ICD-10-CM | POA: Diagnosis not present

## 2022-08-08 DIAGNOSIS — D509 Iron deficiency anemia, unspecified: Secondary | ICD-10-CM | POA: Diagnosis not present

## 2022-08-08 LAB — CBC WITH DIFFERENTIAL (CANCER CENTER ONLY)
Abs Immature Granulocytes: 0.01 10*3/uL (ref 0.00–0.07)
Basophils Absolute: 0 10*3/uL (ref 0.0–0.1)
Basophils Relative: 1 %
Eosinophils Absolute: 1 10*3/uL — ABNORMAL HIGH (ref 0.0–0.5)
Eosinophils Relative: 18 %
HCT: 32.5 % — ABNORMAL LOW (ref 36.0–46.0)
Hemoglobin: 10.1 g/dL — ABNORMAL LOW (ref 12.0–15.0)
Immature Granulocytes: 0 %
Lymphocytes Relative: 27 %
Lymphs Abs: 1.4 10*3/uL (ref 0.7–4.0)
MCH: 27 pg (ref 26.0–34.0)
MCHC: 31.1 g/dL (ref 30.0–36.0)
MCV: 86.9 fL (ref 80.0–100.0)
Monocytes Absolute: 0.3 10*3/uL (ref 0.1–1.0)
Monocytes Relative: 6 %
Neutro Abs: 2.7 10*3/uL (ref 1.7–7.7)
Neutrophils Relative %: 48 %
Platelet Count: 162 10*3/uL (ref 150–400)
RBC: 3.74 MIL/uL — ABNORMAL LOW (ref 3.87–5.11)
RDW: 14.6 % (ref 11.5–15.5)
WBC Count: 5.4 10*3/uL (ref 4.0–10.5)
nRBC: 0 % (ref 0.0–0.2)

## 2022-08-08 LAB — VITAMIN B12: Vitamin B-12: 224 pg/mL (ref 180–914)

## 2022-08-08 MED ORDER — DARBEPOETIN ALFA 200 MCG/0.4ML IJ SOSY
200.0000 ug | PREFILLED_SYRINGE | Freq: Once | INTRAMUSCULAR | Status: AC
  Administered 2022-08-08 (×2): 200 ug via SUBCUTANEOUS
  Filled 2022-08-08: qty 0.4

## 2022-08-08 MED ORDER — CYANOCOBALAMIN 1000 MCG/ML IJ SOLN
1000.0000 ug | Freq: Once | INTRAMUSCULAR | Status: AC
  Administered 2022-08-08 (×2): 1000 ug via INTRAMUSCULAR
  Filled 2022-08-08: qty 1

## 2022-08-08 NOTE — Progress Notes (Signed)
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, B12 deficiency as well as anemia chronic kidney disease She is asymptomatic She will proceed with injection today I will see her again in 3 months for further follow-up with repeat iron studies  Orders Placed This Encounter  Procedures   Ferritin    Standing Status:   Future    Standing Expiration Date:   08/08/2023   Iron and Iron Binding Capacity ([facility] only)    Standing Status:   Future    Standing Expiration Date:   08/08/2023   CBC with Differential (Cancer Center Only)    Standing Status:   Future    Standing Expiration Date:   08/08/2023    The total time spent in the appointment was 20 minutes encounter with patients including review of chart and various tests results, discussions about plan of care and coordination of care plan   All questions were answered. The patient knows to call the clinic with any problems, questions or concerns. No barriers to learning was detected.    Artis Delay, MD 5/16/20242:01 PM  INTERVAL HISTORY: Abigail Hoover 77 y.o. female returns for further follow-up for multifactorial deficiency anemia She missed her appointment recently due to inclement weather Her energy level is fair She denies recent bleeding  SUMMARY OF HEMATOLOGIC HISTORY:  This is a patient that has been followed here since 2011 for chronic anemia In 2011, she had a bone marrow aspirate and biopsy that came back normal. The patient has been receiving Aranesp injection for chronic anemia up until 2014. It was subsequently discontinued due stability of the anemia. Starting 05/27/2014, darbepoetin was resumed at 200 g every other  month to keep hemoglobin greater than 10 g. Treatment was discontinued in October 2016 and resumed in September 2017 The patient is subsequently found to have severe vitamin B12 deficiency and is started on vitamin B12 injection on December 25, 2016 along with resumption of darbepoetin injection every 5 to 6 months.  She is also seen due to family history of breast cancer She also required intravenous iron infusion intermittently for concurrent iron deficiency anemia  I have reviewed the past medical history, past surgical history, social history and family history with the patient and they are unchanged from previous note.  ALLERGIES:  is allergic to atorvastatin and ezetimibe.  MEDICATIONS:  Current Outpatient Medications  Medication Sig Dispense Refill   aspirin EC 81 MG tablet Take 81 mg by mouth daily. Take 1 tablet by mouth once daily.     Cholecalciferol (VITAMIN D3) 1000 units CAPS Vitamin D3 (Patient not taking: Reported on 07/29/2022)     colchicine 0.6 MG tablet TAKE ONE-HALF TABLET (0.3 MG TOTAL) BY MOUTH DAILY. 15 tablet 2   Continuous Blood Gluc Sensor (DEXCOM G7 SENSOR) MISC Inject 1 sensor to the skin every 10 days for continuous glucose monitoring.     Darbepoetin Alfa (ARANESP) 60 MCG/0.3ML SOSY injection Inject 60 mcg into the skin.     denosumab (PROLIA) 60 MG/ML SOSY injection Inject 60 mg into the skin every 6 (six) months. Courier to rheum: [address], [place] [address]. Appt 08/06/22 1 mL 0   docusate sodium (COLACE) 100 MG capsule Take 400 mg  by mouth daily.     famotidine (PEPCID) 40 MG tablet Take 40 mg by mouth 2 (two) times daily.     furosemide (LASIX) 40 MG tablet Take 1 tablet by mouth 2 (two) times daily.     gabapentin (NEURONTIN) 300 MG capsule Take 300 mg by mouth 3 (three) times daily.     glucose blood (ONETOUCH VERIO) test strip Use Onetouch verio test strips to check blood sugar three times daily. (90 day RX) 300 each 2   hydrocortisone 2.5  % ointment      Insulin Pen Needle (PEN NEEDLES 3/16") 31G X 5 MM MISC Use 1 pen needle to inject Forteo daily. 100 each 2   Insulin Pen Needle 31G X 5 MM MISC Use with Novolog 100 each 1   Insulin Syringe-Needle U-100 25G X 5/8" 1 ML MISC USE DAILY TO INJECT INSULIN 100 each 3   isosorbide mononitrate (IMDUR) 120 MG 24 hr tablet Take 1 tablet by mouth every morning.     Lancet Devices (ONE TOUCH DELICA LANCING DEV) MISC 1 each by Does not apply route daily. Use as instructed to check blood sugar once daily. 1 each 0   Lancets (ONETOUCH DELICA PLUS LANCET33G) MISC 4 (four) times daily.     metoprolol succinate (TOPROL-XL) 50 MG 24 hr tablet Take 1 tablet (50 mg total) by mouth daily. Take with or immediately following a meal. 30 tablet 3   mirabegron ER (MYRBETRIQ) 50 MG TB24 tablet Take by mouth.     nitroGLYCERIN (NITROSTAT) 0.4 MG SL tablet PLACE 1 TAB UNDER TONGUE FOR CHEST PAIN EVERY 5 MINUTES     MAXIMUM OF 3 DOSES     CALL 911 AFTER FIRST DOSE. 25 tablet 0   NOVOLOG FLEXPEN 100 UNIT/ML FlexPen INJECT 9-11 UNITS UNDER THE SKIN BEFORE the 3 meals 30 mL 3   pantoprazole (PROTONIX) 40 MG tablet Take 40 mg by mouth every morning.     pentosan polysulfate (ELMIRON) 100 MG capsule TAKE 1 CAPSULE (100 MG TOTAL) BY MOUTH 3 TIMES DAILY BEFORE MEALS.     pravastatin (PRAVACHOL) 20 MG tablet TAKE ONE TABLET BY MOUTH ONCE DAILY 15 tablet 0   pravastatin (PRAVACHOL) 20 MG tablet Take 1 tablet by mouth daily.     sitaGLIPtin (JANUVIA) 25 MG tablet Take 25 mg by mouth daily.     sucralfate (CARAFATE) 1 g tablet 1 tab(s) crush and mix in water 4 times a day (between meals and at bedtime) for 90 days     SYNTHROID 75 MCG tablet TAKE ONE TABLET BY MOUTH ONCE DAILY 90 tablet 1   torsemide (DEMADEX) 20 MG tablet Take 1 tablet by mouth daily.     triamcinolone (KENALOG) 0.025 % ointment Apply 1 application  topically 2 (two) times daily.     Vitamin D, Ergocalciferol, (DRISDOL) 1.25 MG (50000 UNIT) CAPS capsule  TAKE 1 CAPSULE (50,000 UNITS TOTAL) BY MOUTH EVERY 7 (SEVEN) DAYS. 20 capsule 0   No current facility-administered medications for this visit.     REVIEW OF SYSTEMS:   Constitutional: Denies fevers, chills or night sweats Eyes: Denies blurriness of vision Ears, nose, mouth, throat, and face: Denies mucositis or sore throat Respiratory: Denies cough, dyspnea or wheezes Cardiovascular: Denies palpitation, chest discomfort or lower extremity swelling Gastrointestinal:  Denies nausea, heartburn or change in bowel habits Skin: Denies abnormal skin rashes Lymphatics: Denies new lymphadenopathy or easy bruising Neurological:Denies numbness, tingling or new weaknesses Behavioral/Psych: Mood is stable, no new  changes  All other systems were reviewed with the patient and are negative.  PHYSICAL EXAMINATION: ECOG PERFORMANCE STATUS: 0 - Asymptomatic  Vitals:   08/08/22 1109  BP: 138/67  Pulse: 63  Resp: 18  Temp: (!) 97.5 F (36.4 C)  SpO2: 100%   Filed Weights   08/08/22 1109  Weight: 129 lb 6.4 oz (58.7 kg)    GENERAL:alert, no distress and comfortable NEURO: alert & oriented x 3 with fluent speech, no focal motor/sensory deficits  LABORATORY DATA:  I have reviewed the data as listed     Component Value Date/Time   NA 137 07/23/2022 1413   NA 140 01/18/2022 1150   NA 138 12/12/2016 1150   K 4.2 07/23/2022 1413   K 4.6 12/12/2016 1150   CL 97 (L) 07/23/2022 1413   CL 103 07/27/2012 1259   CO2 30 07/23/2022 1413   CO2 31 (H) 12/12/2016 1150   GLUCOSE 142 (H) 07/23/2022 1413   GLUCOSE 149 (H) 12/12/2016 1150   GLUCOSE 118 (H) 07/27/2012 1259   BUN 37 (H) 07/23/2022 1413   BUN 28 (H) 01/18/2022 1150   BUN 23.4 12/12/2016 1150   CREATININE 2.31 (H) 07/23/2022 1413   CREATININE 1.7 (H) 12/12/2016 1150   CALCIUM 9.4 07/23/2022 1413   CALCIUM 11.4 (H) 12/12/2016 1150   PROT 6.1 07/23/2022 1413   PROT 6.5 12/12/2016 1150   ALBUMIN 4.0 04/29/2019 1451   ALBUMIN 3.9  12/12/2016 1150   AST 14 07/23/2022 1413   AST 19 12/12/2016 1150   ALT 6 07/23/2022 1413   ALT 10 12/12/2016 1150   ALKPHOS 68 04/29/2019 1451   ALKPHOS 65 12/12/2016 1150   BILITOT 0.4 07/23/2022 1413   BILITOT 0.36 12/12/2016 1150   GFRNONAA 24 (L) 08/14/2021 1043   GFRNONAA 21 (L) 09/20/2019 1506   GFRAA 25 (L) 09/20/2019 1506    No results found for: "SPEP", "UPEP"  Lab Results  Component Value Date   WBC 5.4 08/08/2022   NEUTROABS 2.7 08/08/2022   HGB 10.1 (L) 08/08/2022   HCT 32.5 (L) 08/08/2022   MCV 86.9 08/08/2022   PLT 162 08/08/2022      Chemistry      Component Value Date/Time   NA 137 07/23/2022 1413   NA 140 01/18/2022 1150   NA 138 12/12/2016 1150   K 4.2 07/23/2022 1413   K 4.6 12/12/2016 1150   CL 97 (L) 07/23/2022 1413   CL 103 07/27/2012 1259   CO2 30 07/23/2022 1413   CO2 31 (H) 12/12/2016 1150   BUN 37 (H) 07/23/2022 1413   BUN 28 (H) 01/18/2022 1150   BUN 23.4 12/12/2016 1150   CREATININE 2.31 (H) 07/23/2022 1413   CREATININE 1.7 (H) 12/12/2016 1150      Component Value Date/Time   CALCIUM 9.4 07/23/2022 1413   CALCIUM 11.4 (H) 12/12/2016 1150   ALKPHOS 68 04/29/2019 1451   ALKPHOS 65 12/12/2016 1150   AST 14 07/23/2022 1413   AST 19 12/12/2016 1150   ALT 6 07/23/2022 1413   ALT 10 12/12/2016 1150   BILITOT 0.4 07/23/2022 1413   BILITOT 0.36 12/12/2016 1150

## 2022-08-08 NOTE — Assessment & Plan Note (Signed)
She has multifactorial anemia She has combined recurrent iron deficiency anemia, B12 deficiency as well as anemia chronic kidney disease She is asymptomatic She will proceed with injection today I will see her again in 3 months for further follow-up with repeat iron studies

## 2022-08-08 NOTE — Assessment & Plan Note (Signed)
She has intermittent B12 deficiency She will receive B12 injection today along with darbepoetin injection 

## 2022-08-09 DIAGNOSIS — R293 Abnormal posture: Secondary | ICD-10-CM | POA: Diagnosis not present

## 2022-08-09 DIAGNOSIS — M25552 Pain in left hip: Secondary | ICD-10-CM | POA: Diagnosis not present

## 2022-08-09 DIAGNOSIS — M5451 Vertebrogenic low back pain: Secondary | ICD-10-CM | POA: Diagnosis not present

## 2022-08-13 ENCOUNTER — Other Ambulatory Visit: Payer: Self-pay | Admitting: Physical Medicine and Rehabilitation

## 2022-08-13 DIAGNOSIS — E559 Vitamin D deficiency, unspecified: Secondary | ICD-10-CM

## 2022-08-20 ENCOUNTER — Telehealth: Payer: Self-pay

## 2022-08-20 NOTE — Telephone Encounter (Signed)
Patients daughter [name] called and left a message requesting a prescription for back brace for patients back pain. She is also requesting that Dr. Corliss Skains advise on how long to wear the brace during the day. Please advise. Thanks!

## 2022-08-21 NOTE — Telephone Encounter (Signed)
Please send a prescription for back brace.  She should not be using the brace only while she is doing activities and not for for more than an hour.  Regular exercise to strengthen the core muscles advised.

## 2022-08-21 NOTE — Telephone Encounter (Signed)
I spoke with patient's daughter and advised her that the prescription for a back brace is at our front desk ready for pick up. Advised her of office hours. Advised per Dr. Corliss Skains: She should be using the brace only while she is doing activities and not for for more than an hour. Regular exercise to strengthen the core muscles advised. (Clarified this with Dr. Corliss Skains per [name]'s questions about duration). [name] verbalized understanding and will stop by the office to pick up rx.

## 2022-08-22 ENCOUNTER — Ambulatory Visit: Payer: Medicare Other | Admitting: Cardiology

## 2022-08-22 DIAGNOSIS — R5383 Other fatigue: Secondary | ICD-10-CM | POA: Diagnosis not present

## 2022-08-22 DIAGNOSIS — K5904 Chronic idiopathic constipation: Secondary | ICD-10-CM | POA: Diagnosis not present

## 2022-08-22 DIAGNOSIS — R630 Anorexia: Secondary | ICD-10-CM | POA: Diagnosis not present

## 2022-08-22 DIAGNOSIS — K219 Gastro-esophageal reflux disease without esophagitis: Secondary | ICD-10-CM | POA: Diagnosis not present

## 2022-08-26 ENCOUNTER — Ambulatory Visit: Payer: Medicare Other | Admitting: Orthopedic Surgery

## 2022-08-26 ENCOUNTER — Other Ambulatory Visit: Payer: Self-pay

## 2022-08-26 ENCOUNTER — Encounter: Payer: Self-pay | Admitting: Hematology and Oncology

## 2022-08-26 ENCOUNTER — Emergency Department (HOSPITAL_BASED_OUTPATIENT_CLINIC_OR_DEPARTMENT_OTHER): Payer: Medicare Other

## 2022-08-26 ENCOUNTER — Encounter (HOSPITAL_BASED_OUTPATIENT_CLINIC_OR_DEPARTMENT_OTHER): Payer: Self-pay | Admitting: Emergency Medicine

## 2022-08-26 ENCOUNTER — Other Ambulatory Visit (HOSPITAL_BASED_OUTPATIENT_CLINIC_OR_DEPARTMENT_OTHER): Payer: Self-pay

## 2022-08-26 ENCOUNTER — Telehealth: Payer: Self-pay | Admitting: *Deleted

## 2022-08-26 ENCOUNTER — Emergency Department (HOSPITAL_BASED_OUTPATIENT_CLINIC_OR_DEPARTMENT_OTHER)
Admission: EM | Admit: 2022-08-26 | Discharge: 2022-08-26 | Disposition: A | Discharge status: Home / Self Care | Payer: Medicare Other | Attending: Emergency Medicine | Admitting: Emergency Medicine

## 2022-08-26 DIAGNOSIS — E871 Hypo-osmolality and hyponatremia: Secondary | ICD-10-CM | POA: Diagnosis not present

## 2022-08-26 DIAGNOSIS — N3000 Acute cystitis without hematuria: Secondary | ICD-10-CM | POA: Insufficient documentation

## 2022-08-26 DIAGNOSIS — Z7982 Long term (current) use of aspirin: Secondary | ICD-10-CM | POA: Diagnosis not present

## 2022-08-26 DIAGNOSIS — E875 Hyperkalemia: Secondary | ICD-10-CM | POA: Diagnosis not present

## 2022-08-26 DIAGNOSIS — I10 Essential (primary) hypertension: Secondary | ICD-10-CM | POA: Diagnosis not present

## 2022-08-26 DIAGNOSIS — E1122 Type 2 diabetes mellitus with diabetic chronic kidney disease: Secondary | ICD-10-CM | POA: Insufficient documentation

## 2022-08-26 DIAGNOSIS — I129 Hypertensive chronic kidney disease with stage 1 through stage 4 chronic kidney disease, or unspecified chronic kidney disease: Secondary | ICD-10-CM | POA: Insufficient documentation

## 2022-08-26 DIAGNOSIS — E1165 Type 2 diabetes mellitus with hyperglycemia: Secondary | ICD-10-CM | POA: Insufficient documentation

## 2022-08-26 DIAGNOSIS — Z79899 Other long term (current) drug therapy: Secondary | ICD-10-CM | POA: Insufficient documentation

## 2022-08-26 DIAGNOSIS — R8289 Other abnormal findings on cytological and histological examination of urine: Secondary | ICD-10-CM | POA: Diagnosis not present

## 2022-08-26 DIAGNOSIS — I700 Atherosclerosis of aorta: Secondary | ICD-10-CM | POA: Diagnosis not present

## 2022-08-26 DIAGNOSIS — Z794 Long term (current) use of insulin: Secondary | ICD-10-CM | POA: Insufficient documentation

## 2022-08-26 DIAGNOSIS — N189 Chronic kidney disease, unspecified: Secondary | ICD-10-CM | POA: Diagnosis not present

## 2022-08-26 DIAGNOSIS — M545 Low back pain, unspecified: Secondary | ICD-10-CM | POA: Insufficient documentation

## 2022-08-26 DIAGNOSIS — R109 Unspecified abdominal pain: Secondary | ICD-10-CM | POA: Diagnosis not present

## 2022-08-26 LAB — URINALYSIS, ROUTINE W REFLEX MICROSCOPIC
Bacteria, UA: NONE SEEN
Bilirubin Urine: NEGATIVE
Glucose, UA: NEGATIVE mg/dL
Hgb urine dipstick: NEGATIVE
Ketones, ur: NEGATIVE mg/dL
Nitrite: NEGATIVE
Specific Gravity, Urine: 1.015 (ref 1.005–1.030)
WBC, UA: >50 WBC/hpf (ref 0–5)
pH: 5.5 (ref 5.0–8.0)

## 2022-08-26 LAB — CBC
HCT: 37.1 % (ref 36.0–46.0)
Hemoglobin: 11.7 g/dL — ABNORMAL LOW (ref 12.0–15.0)
MCH: 26.6 pg (ref 26.0–34.0)
MCHC: 31.5 g/dL (ref 30.0–36.0)
MCV: 84.3 fL (ref 80.0–100.0)
Platelets: 228 10*3/uL (ref 150–400)
RBC: 4.4 MIL/uL (ref 3.87–5.11)
RDW: 16 % — ABNORMAL HIGH (ref 11.5–15.5)
WBC: 5.6 10*3/uL (ref 4.0–10.5)
nRBC: 0 % (ref 0.0–0.2)

## 2022-08-26 LAB — BASIC METABOLIC PANEL
Anion gap: 9 (ref 5–15)
BUN: 18 mg/dL (ref 8–23)
CO2: 23 mmol/L (ref 22–32)
Calcium: 10.6 mg/dL — ABNORMAL HIGH (ref 8.9–10.3)
Chloride: 102 mmol/L (ref 98–111)
Creatinine, Ser: 1.82 mg/dL — ABNORMAL HIGH (ref 0.44–1.00)
GFR, Estimated: 28 mL/min — ABNORMAL LOW (ref >60)
Glucose, Bld: 162 mg/dL — ABNORMAL HIGH (ref 70–99)
Potassium: 4.4 mmol/L (ref 3.5–5.1)
Sodium: 134 mmol/L — ABNORMAL LOW (ref 135–145)

## 2022-08-26 MED ORDER — POLYETHYLENE GLYCOL 3350 17 G PO PACK: 17.0000 g | PACK | Freq: Every day | ORAL | 0 refills | Status: AC

## 2022-08-26 MED ORDER — METOPROLOL SUCCINATE ER 25 MG PO TB24
50.0000 mg | ORAL_TABLET | Freq: Every day | ORAL | Status: DC
  Filled 2022-08-26: qty 2

## 2022-08-26 MED ORDER — IOHEXOL 300 MG/ML  SOLN: 100.0000 mL | Freq: Once | INTRAMUSCULAR | Status: DC | PRN

## 2022-08-26 MED ORDER — CEPHALEXIN 500 MG PO CAPS: 500.0000 mg | ORAL_CAPSULE | Freq: Two times a day (BID) | ORAL | 0 refills | Status: AC

## 2022-08-26 MED ORDER — OXYCODONE-ACETAMINOPHEN 5-325 MG PO TABS
2.0000 | ORAL_TABLET | Freq: Once | ORAL | Status: AC
  Administered 2022-08-26: 2 via ORAL
  Filled 2022-08-26: qty 2

## 2022-08-26 MED ORDER — OXYCODONE HCL 5 MG PO TABS: 5.0000 mg | ORAL_TABLET | Freq: Four times a day (QID) | ORAL | 0 refills | Status: AC | PRN

## 2022-08-26 NOTE — ED Notes (Signed)
Reviewed AVS/discharge instruction with patient. Time allotted for and all questions answered. Patient is agreeable for d/c and escorted to ed exit by staff.  

## 2022-08-26 NOTE — Discharge Instructions (Addendum)
Thank you for letting us take care of you today. We did not see any significant findings on your imaging. Your potassium today was normal. The rest of your labs did not show a cause for your symptoms apart from your urine which looks infected. We are treating you with Keflex for a urinary tract infection which has been dosed according to your kidney function.   I am sending you home with a small amount of pain medication to help with your back as needed. Take this only for severe pain. I recommend using Miralax to help with your constipation until you have a good bowel movement. Follow up closely with your PCP for re-evaluation this week. For any new or worsening symptoms, please return to nearest ED for re-evaluation.     Narrative & Impression  CLINICAL DATA:  Low back pain.  Increased fracture risk.   EXAM: CT LUMBAR SPINE WITHOUT CONTRAST   TECHNIQUE: Multidetector CT imaging of the lumbar spine was performed without intravenous contrast administration. Multiplanar CT image reconstructions were also generated.   RADIATION DOSE REDUCTION: This exam was performed according to the departmental dose-optimization program which includes automated exposure control, adjustment of the mA and/or kV according to patient size and/or use of iterative reconstruction technique.   COMPARISON:  Lumbar spine CT T10-11 2010. MRI lumbar spine 06/06/2022.   FINDINGS: Segmentation: Conventional numbering is assumed with 5 non-rib-bearing, lumbar type vertebral bodies.   Alignment: Normal.   Vertebrae: Prior L4 and L5 kyphoplasties. No new vertebral body height loss. Acquired fusion of the posterior elements from L2-S1 bilaterally. Mild degenerative changes of the bilateral sacroiliac joints.   Paraspinal and other soft tissues: Aortic atherosclerosis.   Disc levels: Unchanged moderate left neural foraminal narrowing at L4-5. No spinal canal stenosis in the lumbar spine.   IMPRESSION: 1. Prior  L4 and L5 kyphoplasties. No new vertebral body height loss. 2. Unchanged moderate left neural foraminal narrowing at L4-5. No spinal canal stenosis in the lumbar spine. 3. Acquired fusion of the posterior elements from L2-S1 bilaterally.   Aortic Atherosclerosis (ICD10-I70.0).

## 2022-08-26 NOTE — Telephone Encounter (Signed)
I reviewed patient's chart.  During the last visit in December patient stated that she has not had any flares of pseudogout since she has been taking colchicine 0.3 mg every day.  In my opinion she should continue colchicine.

## 2022-08-26 NOTE — ED Notes (Signed)
Pt notified that urine sample is needed. Family translating

## 2022-08-26 NOTE — Telephone Encounter (Signed)
Ama contacted the office to inquire if Abigail Hoover is to continue to Colchicine or not. Please advise.

## 2022-08-26 NOTE — ED Triage Notes (Signed)
Pt came from home with lower back pain for 1 week, with nausea, loss of appetite, K was elevated at PCP.  Hx of spinal fracture 2021, osteoporosis

## 2022-08-26 NOTE — ED Notes (Signed)
Patient transported to CT 

## 2022-08-26 NOTE — ED Provider Notes (Signed)
[place] EMERGENCY DEPARTMENT AT [facility] Provider Note   CSN: [id number] Arrival date & time: 08/26/22  1301  Family members at bedside translating.   History  Chief Complaint  Patient presents with   Back Pain    Abigail Hoover is a 77 y.o. female with PMH HTN, thyroid disease, DM, anemia, CKD, HTN, anxiety, who presents to ED with daughter/grandson for evaluation of lower back pain for the last 5 days. No fall or injury to back. History of vertebral fracture and kyphoplasty in 2021. Recently started physical therapy to help with back pain but has been getting worse. Worse with lying flat. No bowel or bladder dysfunction, abdominal pain, nausea, vomiting, fever, chills, dysuria, hematuria, lower extremity weakness, chest pain, shortness of breath, or neck pain. Family states pt not eating well as a result of pain and thus has not had bowel movement in 4-5 days. Urinating normally. No history of dialysis. Not on any medications for pain at home. Reports blood glucose well controlled at home.       Home Medications Prior to Admission medications   Medication Sig Start Date End Date Taking? Authorizing Provider  cephALEXin (KEFLEX) 500 MG capsule Take 1 capsule (500 mg total) by mouth 2 (two) times daily for 7 days. 08/26/22 09/02/22 Yes Grady Mohabir L, PA-C  oxyCODONE (ROXICODONE) 5 MG immediate release tablet Take 1 tablet (5 mg total) by mouth every 6 (six) hours as needed for up to 3 days for severe pain or breakthrough pain. 08/26/22 08/29/22 Yes Harvin Konicek L, PA-C  polyethylene glycol (MIRALAX) 17 g packet Take 17 g by mouth daily for 3 days. 08/26/22 08/29/22 Yes Matthias Bogus L, PA-C  aspirin EC 81 MG tablet Take 81 mg by mouth daily. Take 1 tablet by mouth once daily.    [provider]  Cholecalciferol (VITAMIN D3) 1000 units CAPS Vitamin D3 Patient not taking: Reported on 07/29/2022    [provider]  colchicine 0.6 MG tablet TAKE ONE-HALF TABLET (0.3  MG TOTAL) BY MOUTH DAILY. 05/14/22   Pollyann Savoy, MD  Continuous Blood Gluc Sensor (DEXCOM G7 SENSOR) MISC Inject 1 sensor to the skin every 10 days for continuous glucose monitoring. 01/29/22   [provider]  Darbepoetin Alfa (ARANESP) 60 MCG/0.3ML SOSY injection Inject 60 mcg into the skin.    [provider]  denosumab (PROLIA) 60 MG/ML SOSY injection Inject 60 mg into the skin every 6 (six) months. Courier to rheum: [address], [address], [place] [address]. Appt 08/06/22 07/31/22   Pollyann Savoy, MD  docusate sodium (COLACE) 100 MG capsule Take 400 mg by mouth daily.    [provider]  famotidine (PEPCID) 40 MG tablet Take 40 mg by mouth 2 (two) times daily.    [provider]  furosemide (LASIX) 40 MG tablet Take 1 tablet by mouth 2 (two) times daily.    [provider]  gabapentin (NEURONTIN) 300 MG capsule Take 300 mg by mouth 3 (three) times daily. 09/29/18   [provider]  glucose blood (ONETOUCH VERIO) test strip Use Onetouch verio test strips to check blood sugar three times daily. (90 day RX) 05/04/21   Carlus Pavlov, MD  hydrocortisone 2.5 % ointment     [provider]  Insulin Pen Needle (PEN NEEDLES 3/16") 31G X 5 MM MISC Use 1 pen needle to inject Forteo daily. 07/20/20   Pollyann Savoy, MD  Insulin Pen Needle 31G X 5 MM MISC Use with  Novolog 05/28/19   Reather Littler, MD  Insulin Syringe-Needle U-100 25G X 5/8" 1 ML MISC USE DAILY TO INJECT INSULIN 05/26/18   Reather Littler, MD  isosorbide mononitrate (IMDUR) 120 MG 24 hr tablet Take 1 tablet by mouth every morning.    [provider]  Lancet Devices (ONE TOUCH DELICA LANCING DEV) MISC 1 each by Does not apply route daily. Use as instructed to check blood sugar once daily. 08/06/19   Reather Littler, MD  Lancets [facility] DELICA PLUS [place]) MISC 4 (four) times daily. 12/07/21   [provider]  metoprolol succinate (TOPROL-XL) 50 MG 24 hr  tablet Take 1 tablet (50 mg total) by mouth daily. Take with or immediately following a meal. 01/04/15   Rinaldo Cloud, MD  mirabegron ER (MYRBETRIQ) 50 MG TB24 tablet Take by mouth. 09/29/18   [provider]  nitroGLYCERIN (NITROSTAT) 0.4 MG SL tablet PLACE 1 TAB UNDER TONGUE FOR CHEST PAIN EVERY 5 MINUTES     MAXIMUM OF 3 DOSES     CALL 911 AFTER FIRST DOSE. 07/23/22   Patwardhan, Manish J, MD  NOVOLOG FLEXPEN 100 UNIT/ML FlexPen INJECT 9-11 UNITS UNDER THE SKIN BEFORE the 3 meals 02/19/21   Carlus Pavlov, MD  pantoprazole (PROTONIX) 40 MG tablet Take 40 mg by mouth every morning. 07/19/22   [provider]  pentosan polysulfate (ELMIRON) 100 MG capsule TAKE 1 CAPSULE (100 MG TOTAL) BY MOUTH 3 TIMES DAILY BEFORE MEALS. 09/29/18   [provider]  pravastatin (PRAVACHOL) 20 MG tablet TAKE ONE TABLET BY MOUTH ONCE DAILY 07/30/21   Carlus Pavlov, MD  pravastatin (PRAVACHOL) 20 MG tablet Take 1 tablet by mouth daily. 09/06/21   [provider]  sitaGLIPtin (JANUVIA) 25 MG tablet Take 25 mg by mouth daily.    [provider]  sucralfate (CARAFATE) 1 g tablet 1 tab(s) crush and mix in water 4 times a day (between meals and at bedtime) for 90 days 10/02/20   [provider]  SYNTHROID 75 MCG tablet TAKE ONE TABLET BY MOUTH ONCE DAILY 06/26/21   Carlus Pavlov, MD  torsemide (DEMADEX) 20 MG tablet Take 1 tablet by mouth daily.    [provider]  triamcinolone (KENALOG) 0.025 % ointment Apply 1 application  topically 2 (two) times daily. 04/25/21   [provider]  Vitamin D, Ergocalciferol, (DRISDOL) 1.25 MG (50000 UNIT) CAPS capsule TAKE 1 CAPSULE (50,000 UNITS TOTAL) BY MOUTH EVERY 7 (SEVEN) DAYS. 05/20/22   Carlis Abbott Drema Pry, MD      Allergies    Atorvastatin and Ezetimibe    Review of Systems   Review of Systems  All other systems reviewed and are negative.   Physical Exam Updated Vital Signs BP (!) 113/101   Pulse 61    Temp 98 F (36.7 C) (Oral)   Resp 19   Ht 4\' 9"  (1.448 m)   Wt 59 kg   SpO2 100%   BMI 28.13 kg/m  Physical Exam Vitals and nursing note reviewed.  Constitutional:      General: She is not in acute distress.    Appearance: Normal appearance. She is not ill-appearing or toxic-appearing.  HENT:     Head: Normocephalic and atraumatic.     Mouth/Throat:     Mouth: Mucous membranes are moist.  Eyes:     General: No scleral icterus.    Extraocular Movements: Extraocular movements intact.     Conjunctiva/sclera: Conjunctivae normal.  Cardiovascular:     Rate and  Rhythm: Normal rate and regular rhythm.     Heart sounds: No murmur heard. Pulmonary:     Effort: Pulmonary effort is normal. No respiratory distress.     Breath sounds: Normal breath sounds. No stridor. No wheezing, rhonchi or rales.  Abdominal:     General: Abdomen is flat. There is no distension.     Palpations: Abdomen is soft.     Tenderness: There is no abdominal tenderness. There is no right CVA tenderness, left CVA tenderness, guarding or rebound.  Musculoskeletal:        General: No deformity. Normal range of motion.     Cervical back: Normal range of motion and neck supple. No rigidity or tenderness.     Right lower leg: No edema.     Left lower leg: No edema.     Comments: No midline CTL spinal tenderness, stepoffs, or deformities, moving all extremities x 4 and equally, 5/5 strength to bilateral UE and LE, negative SLR bilaterally, moderate tenderness over paraspinous muscles to left of lumbar spine, pelvis stable and nontender  Skin:    General: Skin is warm and dry.     Capillary Refill: Capillary refill takes less than 2 seconds.  Neurological:     General: No focal deficit present.     Mental Status: She is alert.  Psychiatric:        Behavior: Behavior normal.     ED Results / Procedures / Treatments   Labs (all labs ordered are listed, but only abnormal results are displayed) Labs Reviewed  BASIC  METABOLIC PANEL - Abnormal; Notable for the following components:      Result Value   Sodium 134 (*)    Glucose, Bld 162 (*)    Creatinine, Ser 1.82 (*)    Calcium 10.6 (*)    GFR, Estimated 28 (*)    All other components within normal limits  CBC - Abnormal; Notable for the following components:   Hemoglobin 11.7 (*)    RDW 16.0 (*)    All other components within normal limits  URINALYSIS, ROUTINE W REFLEX MICROSCOPIC - Abnormal; Notable for the following components:   APPearance HAZY (*)    Protein, ur TRACE (*)    Leukocytes,Ua LARGE (*)    Non Squamous Epithelial 0-5 (*)    All other components within normal limits    EKG None  Radiology CT ABDOMEN PELVIS WO CONTRAST  Result Date: 08/26/2022 CLINICAL DATA:  Bowel obstruction suspected.  Pain. EXAM: CT ABDOMEN AND PELVIS WITHOUT CONTRAST TECHNIQUE: Multidetector CT imaging of the abdomen and pelvis was performed following the standard protocol without IV contrast. RADIATION DOSE REDUCTION: This exam was performed according to the departmental dose-optimization program which includes automated exposure control, adjustment of the mA and/or kV according to patient size and/or use of iterative reconstruction technique. COMPARISON:  CT abdomen and pelvis 05/09/2022 FINDINGS: Lower chest: No acute abnormality. Hepatobiliary: No focal liver abnormality is seen. No gallstones, gallbladder wall thickening, or biliary dilatation. Pancreas: Unremarkable. No pancreatic ductal dilatation or surrounding inflammatory changes. Spleen: Normal in size without focal abnormality. Adrenals/Urinary Tract: Adrenal glands are unremarkable. Kidneys are normal, without renal calculi, focal lesion, or hydronephrosis. Bladder is unremarkable. Stomach/Bowel: There is a small hiatal hernia. Stomach is within normal limits. Appendix appears normal. No evidence of bowel wall thickening, distention, or inflammatory changes. Vascular/Lymphatic: Aortic atherosclerosis. No  enlarged abdominal or pelvic lymph nodes. Reproductive: Status post hysterectomy. No adnexal masses. Other: No abdominal wall hernia or abnormality. No  abdominopelvic ascites. Musculoskeletal: There are vertebroplasty changes at L4 and L5. Trace compression deformity of L3 appears unchanged. Left hip arthroplasty is present. IMPRESSION: 1. No acute localizing process in the abdomen or pelvis. 2. Small hiatal hernia. Aortic Atherosclerosis (ICD10-I70.0). Electronically Signed   By: Darliss Cheney M.D.   On: 08/26/2022 16:12   CT L-SPINE NO CHARGE  Result Date: 08/26/2022 CLINICAL DATA:  Low back pain.  Increased fracture risk. EXAM: CT LUMBAR SPINE WITHOUT CONTRAST TECHNIQUE: Multidetector CT imaging of the lumbar spine was performed without intravenous contrast administration. Multiplanar CT image reconstructions were also generated. RADIATION DOSE REDUCTION: This exam was performed according to the departmental dose-optimization program which includes automated exposure control, adjustment of the mA and/or kV according to patient size and/or use of iterative reconstruction technique. COMPARISON:  Lumbar spine CT T10-11 2010. MRI lumbar spine 06/06/2022. FINDINGS: Segmentation: Conventional numbering is assumed with 5 non-rib-bearing, lumbar type vertebral bodies. Alignment: Normal. Vertebrae: Prior L4 and L5 kyphoplasties. No new vertebral body height loss. Acquired fusion of the posterior elements from L2-S1 bilaterally. Mild degenerative changes of the bilateral sacroiliac joints. Paraspinal and other soft tissues: Aortic atherosclerosis. Disc levels: Unchanged moderate left neural foraminal narrowing at L4-5. No spinal canal stenosis in the lumbar spine. IMPRESSION: 1. Prior L4 and L5 kyphoplasties. No new vertebral body height loss. 2. Unchanged moderate left neural foraminal narrowing at L4-5. No spinal canal stenosis in the lumbar spine. 3. Acquired fusion of the posterior elements from L2-S1 bilaterally.  Aortic Atherosclerosis (ICD10-I70.0). Electronically Signed   By: Orvan Falconer M.D.   On: 08/26/2022 16:07    Procedures Procedures    Medications Ordered in ED Medications  metoprolol succinate (TOPROL-XL) 24 hr tablet 50 mg (0 mg Oral Hold 08/26/22 1522)  oxyCODONE-acetaminophen (PERCOCET/ROXICET) 5-325 MG per tablet 2 tablet (2 tablets Oral Given 08/26/22 1514)    ED Course/ Medical Decision Making/ A&P                             Medical Decision Making Amount and/or Complexity of Data Reviewed Labs: ordered. Decision-making details documented in ED Course. Radiology: ordered. Decision-making details documented in ED Course. ECG/medicine tests: ordered. Decision-making details documented in ED Course.  Risk Prescription drug management.   Medical Decision Making:   Abigail Hoover is a 77 y.o. female who presented to the ED today with back pain detailed above.    Additional history discussed with patient's family/caregivers.  Patient's presentation is complicated by their history of vertebral fracture, CKD, HTN, advanced age.  Complete initial physical exam performed, notably the patient  was in NAD. Abdomen soft, nontender, non-distended. Nonfocal neuro exam. Moving all extremities. No meningismus. No midline spinal tenderness or deformities.    Reviewed and confirmed nursing documentation for past medical history, family history, social history.    Initial Assessment:   With the patient's presentation of back pain, the emergent differential diagnosis for back pain includes but is not limited to fracture, muscle strain, cauda equina, spinal stenosis, DDD, ankylosing spondylitis, acute ligamentous injury, disk herniation, spondylolisthesis, epidural compression syndrome, metastatic cancer, transverse myelitis, vertebral osteomyelitis, diskitis, kidney stone, pyelonephritis, AAA, Perforated ulcer, retrocecal appendicitis, pancreatitis, bowel obstruction, retroperitoneal hemorrhage  or mass, meningitis.   Initial Plan:  Screening labs including CBC and Metabolic panel to evaluate for infectious or metabolic etiology of disease.  Urinalysis with reflex culture ordered to evaluate for UTI or relevant urologic/nephrologic pathology.  EKG to evaluate for  cardiac pathology CTAP/CT L spine to evaluate for etiology of symptoms Blood pressure medication as not taken today Symptomatic management Objective evaluation as reviewed   Initial Study Results:   Laboratory  All laboratory results reviewed without evidence of clinically relevant pathology.   Exceptions include: Na 134, glucose 162, Cr 1.82, GFR 28, Hgb 11.7, UA positive for large leukocytes   EKG EKG was reviewed independently. ST segments without concerns for elevations.   EKG: normal sinus rhythm.   Radiology:  All images reviewed independently. Agree with radiology report at this time.   CT ABDOMEN PELVIS WO CONTRAST  Result Date: 08/26/2022 CLINICAL DATA:  Bowel obstruction suspected.  Pain. EXAM: CT ABDOMEN AND PELVIS WITHOUT CONTRAST TECHNIQUE: Multidetector CT imaging of the abdomen and pelvis was performed following the standard protocol without IV contrast. RADIATION DOSE REDUCTION: This exam was performed according to the departmental dose-optimization program which includes automated exposure control, adjustment of the mA and/or kV according to patient size and/or use of iterative reconstruction technique. COMPARISON:  CT abdomen and pelvis 05/09/2022 FINDINGS: Lower chest: No acute abnormality. Hepatobiliary: No focal liver abnormality is seen. No gallstones, gallbladder wall thickening, or biliary dilatation. Pancreas: Unremarkable. No pancreatic ductal dilatation or surrounding inflammatory changes. Spleen: Normal in size without focal abnormality. Adrenals/Urinary Tract: Adrenal glands are unremarkable. Kidneys are normal, without renal calculi, focal lesion, or hydronephrosis. Bladder is unremarkable.  Stomach/Bowel: There is a small hiatal hernia. Stomach is within normal limits. Appendix appears normal. No evidence of bowel wall thickening, distention, or inflammatory changes. Vascular/Lymphatic: Aortic atherosclerosis. No enlarged abdominal or pelvic lymph nodes. Reproductive: Status post hysterectomy. No adnexal masses. Other: No abdominal wall hernia or abnormality. No abdominopelvic ascites. Musculoskeletal: There are vertebroplasty changes at L4 and L5. Trace compression deformity of L3 appears unchanged. Left hip arthroplasty is present. IMPRESSION: 1. No acute localizing process in the abdomen or pelvis. 2. Small hiatal hernia. Aortic Atherosclerosis (ICD10-I70.0). Electronically Signed   By: Darliss Cheney M.D.   On: 08/26/2022 16:12   CT L-SPINE NO CHARGE  Result Date: 08/26/2022 CLINICAL DATA:  Low back pain.  Increased fracture risk. EXAM: CT LUMBAR SPINE WITHOUT CONTRAST TECHNIQUE: Multidetector CT imaging of the lumbar spine was performed without intravenous contrast administration. Multiplanar CT image reconstructions were also generated. RADIATION DOSE REDUCTION: This exam was performed according to the departmental dose-optimization program which includes automated exposure control, adjustment of the mA and/or kV according to patient size and/or use of iterative reconstruction technique. COMPARISON:  Lumbar spine CT T10-11 2010. MRI lumbar spine 06/06/2022. FINDINGS: Segmentation: Conventional numbering is assumed with 5 non-rib-bearing, lumbar type vertebral bodies. Alignment: Normal. Vertebrae: Prior L4 and L5 kyphoplasties. No new vertebral body height loss. Acquired fusion of the posterior elements from L2-S1 bilaterally. Mild degenerative changes of the bilateral sacroiliac joints. Paraspinal and other soft tissues: Aortic atherosclerosis. Disc levels: Unchanged moderate left neural foraminal narrowing at L4-5. No spinal canal stenosis in the lumbar spine. IMPRESSION: 1. Prior L4 and L5  kyphoplasties. No new vertebral body height loss. 2. Unchanged moderate left neural foraminal narrowing at L4-5. No spinal canal stenosis in the lumbar spine. 3. Acquired fusion of the posterior elements from L2-S1 bilaterally. Aortic Atherosclerosis (ICD10-I70.0). Electronically Signed   By: Orvan Falconer M.D.   On: 08/26/2022 16:07      Final Assessment and Plan:   Patient presents to ED c/o back pain. No red flag symptoms. No history of malignancy or IV drug use. No severe trauma to the back.  Nonfocal neuro exam. History of L fractures with kyphoplasty. Evaluated by attending physician who co-signed this note who agreed with management. Workup initiated as above for evaluation. Pt with good pain control with oxycodone. CTAP and L spine without acute abnormality to explain pt's symptoms. Unable to use contrast due to pt's kidney function. Potassium normal here today. No leukocytosis, no other significant electrolyte disturbance. Baseline kidney function. UA with >50 WBC. No squamous epithelial. Consistent with UTI. Will treat with renally dosed Keflex. Discussed with family/pt findings and treatment plan with outpatient follow up. No evidence bowel obstruction, soft and nontender abdomen. Will have pt use Miralax at home for constipation. Family expressed understanding of plan. Will provide with small amount of oxycodone for pain as needed until outpatient follow up. Strict ED return precautions given, all questions answered, and stable for discharge.    Clinical Impression:  1. Acute cystitis without hematuria   2. Acute bilateral low back pain without sciatica      Discharge           Final Clinical Impression(s) / ED Diagnoses Final diagnoses:  Acute cystitis without hematuria  Acute bilateral low back pain without sciatica    Rx / DC Orders ED Discharge Orders          Ordered    cephALEXin (KEFLEX) 500 MG capsule  2 times daily        08/26/22 1743    oxyCODONE (ROXICODONE)  5 MG immediate release tablet  Every 6 hours PRN        08/26/22 1743    polyethylene glycol (MIRALAX) 17 g packet  Daily        08/26/22 1743              Tonette Lederer, PA-C 08/26/22 1750    Rondel Baton, MD 08/29/22 1017

## 2022-08-27 NOTE — Telephone Encounter (Signed)
Patient's daughter [name] advised Dr. Corliss Skains reviewed patient's chart. During the last visit in December patient stated that she has not had any flares of pseudogout since she has been taking colchicine 0.3 mg every day. In Dr. Fatima Sanger opinion she should continue colchicine.

## 2022-08-30 ENCOUNTER — Telehealth: Payer: Self-pay

## 2022-08-30 NOTE — Telephone Encounter (Signed)
Transition Care Management Unsuccessful Follow-up Telephone Call  Date of discharge and from where:  [facility]  Attempts:  1st Attempt  Reason for unsuccessful TCM follow-up call:  Left voice message   Lenard Forth [facility] Guide, [facility] [phone] [address] [place], [place], [place] [address] Phone: [phone] Email: [email or web address]

## 2022-08-30 NOTE — Telephone Encounter (Signed)
Transition Care Management Unsuccessful Follow-up Telephone Call  Date of discharge and from where:  [facility]  Attempts:  2nd Attempt  Reason for unsuccessful TCM follow-up call:  Left voice message   Lenard Forth [facility] Guide, [facility] [phone] [address] [place], [place], [place] [address] Phone: [phone] Email: [email or web address]

## 2022-08-31 DIAGNOSIS — E1122 Type 2 diabetes mellitus with diabetic chronic kidney disease: Secondary | ICD-10-CM | POA: Diagnosis not present

## 2022-09-03 DIAGNOSIS — N390 Urinary tract infection, site not specified: Secondary | ICD-10-CM | POA: Diagnosis not present

## 2022-09-03 DIAGNOSIS — N184 Chronic kidney disease, stage 4 (severe): Secondary | ICD-10-CM | POA: Diagnosis not present

## 2022-09-04 ENCOUNTER — Other Ambulatory Visit: Payer: Self-pay | Admitting: Rheumatology

## 2022-09-04 DIAGNOSIS — M5416 Radiculopathy, lumbar region: Secondary | ICD-10-CM | POA: Diagnosis not present

## 2022-09-04 NOTE — Telephone Encounter (Signed)
Last Fill: 05/14/2022  Labs: 08/26/2022 Glucose 162, Creat. 1.82, GFR 28, Calcium 10.6, Hgb 11.7, RDW 16.0  Next Visit: 10/04/2022  Last Visit: 03/09/2023  DX: Chondrocalcinosis   Current Dose per office note 03/08/2022: colchicine 0.3mg  by mouth daily   Okay to refill Colchicine?

## 2022-09-06 ENCOUNTER — Ambulatory Visit: Payer: Medicare Other | Admitting: Rheumatology

## 2022-09-06 ENCOUNTER — Encounter: Payer: Self-pay | Admitting: Orthopaedic Surgery

## 2022-09-06 ENCOUNTER — Other Ambulatory Visit: Payer: Self-pay | Admitting: *Deleted

## 2022-09-06 ENCOUNTER — Other Ambulatory Visit: Payer: Self-pay | Admitting: Orthopaedic Surgery

## 2022-09-06 ENCOUNTER — Encounter: Payer: Self-pay | Admitting: Vascular Surgery

## 2022-09-06 DIAGNOSIS — R600 Localized edema: Secondary | ICD-10-CM

## 2022-09-06 DIAGNOSIS — M5416 Radiculopathy, lumbar region: Secondary | ICD-10-CM

## 2022-09-09 ENCOUNTER — Ambulatory Visit
Admission: RE | Admit: 2022-09-09 | Discharge: 2022-09-09 | Disposition: A | Discharge status: Home / Self Care | Payer: Medicare Other | Source: Ambulatory Visit | Attending: Orthopaedic Surgery | Admitting: Orthopaedic Surgery

## 2022-09-09 DIAGNOSIS — M545 Low back pain, unspecified: Secondary | ICD-10-CM | POA: Diagnosis not present

## 2022-09-09 DIAGNOSIS — M5416 Radiculopathy, lumbar region: Secondary | ICD-10-CM

## 2022-09-15 ENCOUNTER — Other Ambulatory Visit: Payer: Medicare Other

## 2022-09-16 ENCOUNTER — Other Ambulatory Visit: Payer: Self-pay

## 2022-09-16 ENCOUNTER — Other Ambulatory Visit (HOSPITAL_COMMUNITY): Payer: Self-pay

## 2022-09-17 ENCOUNTER — Ambulatory Visit: Payer: Medicare Other | Admitting: Vascular Surgery

## 2022-09-17 ENCOUNTER — Ambulatory Visit (HOSPITAL_COMMUNITY)
Admission: RE | Admit: 2022-09-17 | Discharge: 2022-09-17 | Disposition: A | Discharge status: Home / Self Care | Payer: Medicare Other | Source: Ambulatory Visit | Attending: Vascular Surgery | Admitting: Vascular Surgery

## 2022-09-17 ENCOUNTER — Encounter: Payer: Self-pay | Admitting: Vascular Surgery

## 2022-09-17 VITALS — BP 128/64 | HR 61 | Temp 97.7°F | Resp 16 | Ht <= 58 in | Wt 128.0 lb

## 2022-09-17 DIAGNOSIS — R600 Localized edema: Secondary | ICD-10-CM | POA: Diagnosis not present

## 2022-09-17 DIAGNOSIS — I872 Venous insufficiency (chronic) (peripheral): Secondary | ICD-10-CM | POA: Diagnosis not present

## 2022-09-17 NOTE — Progress Notes (Signed)
[facility] [place]  ASSESSMENT / PLAN: Abigail Hoover is a 77 y.o. 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  Venous duplex is significant for vein reflux and saphenofemoral junction reflux without other greater saphenous vein reflux. Recommend compression and elevation for symptomatic relief. Follow up with me as needed.  CHIEF COMPLAINT: left leg swelling and itching  HISTORY OF PRESENT ILLNESS: Abigail Hoover is a 77 y.o. female who presents to clinic for evaluation of swollen legs.  The patient also has associated pruritus and eczematous rash over the left pretibial skin.  This is somewhat bothersome to her and she excoriates it regularly.  She has been treated for cellulitis in this leg in the past.  I reviewed her noninvasive testing today with her and her grandson who helps translate to [place].  Past Medical History:  Diagnosis Date   Anemia in chronic kidney disease 05/01/2015   Anxiety    Arthritis    Bladder irritation    Diabetes mellitus    GERD (gastroesophageal reflux disease)    Hypercholesterolemia    Hypertension    Hypothyroidism    Iron deficiency anemia, unspecified    Psoriasis    Transfusion history 03/05/2016   for postoperative (ORIF) anemia superimposed on chronic anemia    Past Surgical History:  Procedure Laterality Date   ABDOMINAL HYSTERECTOMY     TAH   BREAST BIOPSY     Left breast biopsy benign lesion   BREAST BIOPSY  03/11/2012   Procedure: BREAST BIOPSY;  Surgeon: Adolph Pollack, MD;  Location[facility] OR;  Service: General;  Laterality: Left;  remove left breast mass   BREAST EXCISIONAL BIOPSY Left    CARDIAC CATHETERIZATION N/A 01/02/2015   Procedure: Left Heart Cath and Coronary Angiography;  Surgeon: Rinaldo Cloud, MD;  Location: [facility] INVASIVE CV LAB;  Service: Cardiovascular;  Laterality: N/A;   CATARACT EXTRACTION     CERVICAL  DISC SURGERY     COLONOSCOPY  2013   neg. next 2023.    EYE SURGERY     IR KYPHO EA ADDL LEVEL THORACIC OR LUMBAR  09/09/2019   IR KYPHO LUMBAR INC FX REDUCE BONE BX UNI/BIL CANNULATION INC/IMAGING  09/09/2019   IR RADIOLOGIST EVAL & MGMT  09/08/2019   LEFT HIP SURGERY     TOTAL HIP ARTHROPLASTY Left 03/02/2016   Procedure: TOTAL HIP ARTHROPLASTY ANTERIOR APPROACH;  Surgeon: Samson Frederic, MD;  Location: [facility] OR;  Service: Orthopedics;  Laterality: Left;    Family History  Problem Relation Age of Onset   Heart disease Mother        heart attack   Stroke Father        brain hemorrhage   Diabetes Brother    Breast cancer Sister    Cancer Sister        breast cancer    Social History   Socioeconomic History   Marital status: Widowed    Spouse name: Not on file   Number of children: 1   Years of education: college   Highest education level: Not on file  Occupational History   Occupation: Homemaker  Tobacco Use   Smoking status: Never    Passive exposure: Never   Smokeless tobacco: Never  Vaping Use   Vaping Use: Never used  Substance and Sexual Activity   Alcohol use: No    Alcohol/week: 0.0 standard drinks of alcohol   Drug use:  No   Sexual activity: Not Currently  Other Topics Concern   Not on file  Social History Narrative   Patient is married with one child.   Patient is right handed.   Patient has college education.   Patient drinks tea, two times a day   Social Determinants of Health   Financial Resource Strain: Not on file  Food Insecurity: Not on file  Transportation Needs: Not on file  Physical Activity: Not on file  Stress: Not on file  Social Connections: Not on file  Intimate Partner Violence: Not on file    Allergies  Allergen Reactions   Atorvastatin Rash and Other (See Comments)   Ezetimibe Itching and Other (See Comments)    Current Outpatient Medications  Medication Sig Dispense Refill   aspirin EC 81 MG tablet Take 81 mg by mouth daily.  Take 1 tablet by mouth once daily.     Cholecalciferol (VITAMIN D3) 1000 units CAPS      colchicine 0.6 MG tablet TAKE ONE-HALF TABLET (0.3 MG TOTAL) BY MOUTH DAILY. 15 tablet 2   Continuous Blood Gluc Sensor (DEXCOM G7 SENSOR) MISC Inject 1 sensor to the skin every 10 days for continuous glucose monitoring.     Darbepoetin Alfa (ARANESP) 60 MCG/0.3ML SOSY injection Inject 60 mcg into the skin.     denosumab (PROLIA) 60 MG/ML SOSY injection Inject 60 mg into the skin every 6 (six) months. Courier to rheum: [address], [address], [place] [address]. Appt 08/06/22 1 mL 0   docusate sodium (COLACE) 100 MG capsule Take 400 mg by mouth daily.     famotidine (PEPCID) 40 MG tablet Take 40 mg by mouth 2 (two) times daily.     furosemide (LASIX) 40 MG tablet Take 1 tablet by mouth 2 (two) times daily.     gabapentin (NEURONTIN) 300 MG capsule Take 300 mg by mouth 3 (three) times daily.     glucose blood (ONETOUCH VERIO) test strip Use Onetouch verio test strips to check blood sugar three times daily. (90 day RX) 300 each 2   hydrocortisone 2.5 % ointment      Insulin Pen Needle (PEN NEEDLES 3/16") 31G X 5 MM MISC Use 1 pen needle to inject Forteo daily. 100 each 2   Insulin Pen Needle 31G X 5 MM MISC Use with Novolog 100 each 1   Insulin Syringe-Needle U-100 25G X 5/8" 1 ML MISC USE DAILY TO INJECT INSULIN 100 each 3   isosorbide mononitrate (IMDUR) 120 MG 24 hr tablet Take 1 tablet by mouth every morning.     Lancet Devices (ONE TOUCH DELICA LANCING DEV) MISC 1 each by Does not apply route daily. Use as instructed to check blood sugar once daily. 1 each 0   Lancets (ONETOUCH DELICA PLUS LANCET33G) MISC 4 (four) times daily.     metoprolol succinate (TOPROL-XL) 50 MG 24 hr tablet Take 1 tablet (50 mg total) by mouth daily. Take with or immediately following a meal. 30 tablet 3   mirabegron ER (MYRBETRIQ) 50 MG TB24 tablet Take by mouth.     nitroGLYCERIN (NITROSTAT) 0.4 MG SL tablet PLACE 1 TAB  UNDER TONGUE FOR CHEST PAIN EVERY 5 MINUTES     MAXIMUM OF 3 DOSES     CALL 911 AFTER FIRST DOSE. 25 tablet 0   NOVOLOG FLEXPEN 100 UNIT/ML FlexPen INJECT 9-11 UNITS UNDER THE SKIN BEFORE the 3 meals 30 mL 3   pantoprazole (PROTONIX) 40 MG tablet Take 40  mg by mouth every morning.     pentosan polysulfate (ELMIRON) 100 MG capsule TAKE 1 CAPSULE (100 MG TOTAL) BY MOUTH 3 TIMES DAILY BEFORE MEALS.     pravastatin (PRAVACHOL) 20 MG tablet TAKE ONE TABLET BY MOUTH ONCE DAILY 15 tablet 0   pravastatin (PRAVACHOL) 20 MG tablet Take 1 tablet by mouth daily.     sitaGLIPtin (JANUVIA) 25 MG tablet Take 25 mg by mouth daily.     sucralfate (CARAFATE) 1 g tablet 1 tab(s) crush and mix in water 4 times a day (between meals and at bedtime) for 90 days     SYNTHROID 75 MCG tablet TAKE ONE TABLET BY MOUTH ONCE DAILY 90 tablet 1   torsemide (DEMADEX) 20 MG tablet Take 1 tablet by mouth daily.     triamcinolone (KENALOG) 0.025 % ointment Apply 1 application  topically 2 (two) times daily.     Vitamin D, Ergocalciferol, (DRISDOL) 1.25 MG (50000 UNIT) CAPS capsule TAKE 1 CAPSULE (50,000 UNITS TOTAL) BY MOUTH EVERY 7 (SEVEN) DAYS. 20 capsule 0   No current facility-administered medications for this visit.    PHYSICAL EXAM Vitals:   09/17/22 1312  BP: 128/64  Pulse: 61  Resp: 16  Temp: 97.7 F (36.5 C)  SpO2: 100%  Weight: 128 lb (58.1 kg)  Height: 4\' 9"  (1.448 m)   Elderly woman in no distress Regular rate and rhythm Unlabored breathing 2+ dorsalis pedis pulses bilaterally 2+ edema about the ankles and calves bilaterally Left pretibial skin has eczematous skin changes consistent with chronic venous insufficiency  PERTINENT LABORATORY AND RADIOLOGIC DATA  Most recent CBC    Latest Ref Rng & Units 08/26/2022    1:26 PM 08/08/2022   10:37 AM 07/23/2022    2:13 PM  CBC  WBC 4.0 - 10.5 K/uL 5.6  5.4  5.8   Hemoglobin 12.0 - 15.0 g/dL 40.9  81.1  9.9   Hematocrit 36.0 - 46.0 % 37.1  32.5  32.0    Platelets 150 - 400 K/uL 228  162  170      Most recent CMP    Latest Ref Rng & Units 08/26/2022    1:26 PM 07/23/2022    2:13 PM 01/18/2022   11:50 AM  CMP  Glucose 70 - 99 mg/dL 914  782  956   BUN 8 - 23 mg/dL 18  37  28   Creatinine 0.44 - 1.00 mg/dL 2.13  0.86  5.78   Sodium 135 - 145 mmol/L 134  137  140   Potassium 3.5 - 5.1 mmol/L 4.4  4.2  4.7   Chloride 98 - 111 mmol/L 102  97  101   CO2 22 - 32 mmol/L 23  30  21    Calcium 8.9 - 10.3 mg/dL 46.9  9.4  8.8   Total Protein 6.1 - 8.1 g/dL  6.1    Total Bilirubin 0.2 - 1.2 mg/dL  0.4    AST 10 - 35 U/L  14    ALT 6 - 29 U/L  6      Renal function CrCl cannot be calculated (Patient's most recent lab result is older than the maximum 21 days allowed.).  Hemoglobin A1C (%)  Date Value  07/25/2020 6.2 (A)   Hgb A1c MFr Bld (%)  Date Value  07/30/2019 7.6 (H)    LDL Chol Calc (NIH)  Date Value Ref Range Status  01/18/2022 66 0 - 99 mg/dL Final    Left venous reflux  study:  - No evidence of superficial venous reflux seen in the left greater  saphenous vein.  - No evidence of superficial venous reflux seen in the left short  saphenous vein.  - Venous reflux is noted in the left common femoral vein.  - Venous reflux is noted in the left sapheno-femoral junction.   - Venous reflux is noted in the AASV .   Rande Brunt. Lenell Antu, MD FACS [facility] Phone Number: [phone] 09/18/2022 10:05 AM   Total time spent on preparing this encounter including chart review, data review, collecting history, examining the patient, coordinating care for this new patient, 45 minutes.  Portions of this report may have been transcribed using voice recognition software.  Every effort has been made to ensure accuracy; however, inadvertent computerized transcription errors may still be present.

## 2022-09-18 DIAGNOSIS — Z794 Long term (current) use of insulin: Secondary | ICD-10-CM | POA: Diagnosis not present

## 2022-09-18 DIAGNOSIS — N184 Chronic kidney disease, stage 4 (severe): Secondary | ICD-10-CM | POA: Diagnosis not present

## 2022-09-18 DIAGNOSIS — M5416 Radiculopathy, lumbar region: Secondary | ICD-10-CM | POA: Diagnosis not present

## 2022-09-18 DIAGNOSIS — E1122 Type 2 diabetes mellitus with diabetic chronic kidney disease: Secondary | ICD-10-CM | POA: Diagnosis not present

## 2022-09-18 DIAGNOSIS — E039 Hypothyroidism, unspecified: Secondary | ICD-10-CM | POA: Diagnosis not present

## 2022-09-18 DIAGNOSIS — Z7984 Long term (current) use of oral hypoglycemic drugs: Secondary | ICD-10-CM | POA: Diagnosis not present

## 2022-09-20 ENCOUNTER — Other Ambulatory Visit: Payer: Self-pay | Admitting: Gastroenterology

## 2022-09-20 DIAGNOSIS — Z1231 Encounter for screening mammogram for malignant neoplasm of breast: Secondary | ICD-10-CM

## 2022-09-20 NOTE — Progress Notes (Deleted)
Office Visit Note  Patient: Abigail Hoover             Date of Birth: 1945-09-05           MRN: [id number]             PCP: Abigail Savoy, MD Referring: Abigail Savoy, MD Visit Date: 10/04/2022 Occupation: @GUAROCC @  Subjective:  No chief complaint on file.   History of Present Illness: Abigail Hoover is a 77 y.o. female ***     Activities of Daily Living:  Patient reports morning stiffness for *** {minute/hour:19697}.   Patient {ACTIONS;DENIES/REPORTS:21021675::"Denies"} nocturnal pain.  Difficulty dressing/grooming: {ACTIONS;DENIES/REPORTS:21021675::"Denies"} Difficulty climbing stairs: {ACTIONS;DENIES/REPORTS:21021675::"Denies"} Difficulty getting out of chair: {ACTIONS;DENIES/REPORTS:21021675::"Denies"} Difficulty using hands for taps, buttons, cutlery, and/or writing: {ACTIONS;DENIES/REPORTS:21021675::"Denies"}  No Rheumatology ROS completed.   PMFS History:  Patient Active Problem List   Diagnosis Date Noted   Leg edema 07/29/2022   Chronic idiopathic constipation 07/02/2022   Constipation 07/02/2022   Right lower quadrant pain 07/02/2022   Osteoarthritis of knee 03/06/2022   Preventive measure 01/31/2022   Precordial pain 01/16/2022   Stable angina 01/16/2022   Chest pain on breathing 12/07/2021   Inflammation of joint of both hands 10/26/2020   DDD (degenerative disc disease), lumbar 08/26/2019   Age-related osteoporosis without current pathological fracture 08/26/2019   Primary osteoarthritis of both knees 08/26/2019   Primary osteoarthritis of both hands 08/26/2019   Diabetes mellitus with chronic kidney disease, with long-term current use of insulin (HCC) 05/11/2019   Pain due to onychomycosis of toenails of both feet 09/30/2018   Trigger thumb of right hand 09/02/2018   Chronic pain of right thumb 08/27/2018   Pain in finger of right hand 06/01/2018   Plantar fasciitis of left foot 05/19/2018   Lower limb pain, inferior, left 04/28/2018    Dry skin 03/12/2018   Neck pain 01/27/2018   Other fatigue 09/18/2017   Pain and swelling of right wrist 08/06/2017   At high risk for breast cancer 03/06/2017   Hypercalcemia 12/16/2016   Vitamin B 12 deficiency 12/16/2016   Displaced fracture of left femoral neck (HCC) 03/02/2016   Hyperkalemia 03/01/2016   Femoral neck fracture (HCC) 02/29/2016   Throat burning 01/23/2016   Anemia due to stage 3 (moderate) chronic renal failure treated with erythropoietin (HCC) 12/18/2015   Chronic kidney disease, stage III (moderate) (HCC) 08/14/2015   Anemia in chronic kidney disease 05/01/2015   Acute coronary syndrome (HCC) 01/02/2015   Lumbar spondylosis with myelopathy 06/27/2014   Deficiency anemia 05/27/2014   Carpal tunnel syndrome 12/09/2013   Diabetic neuropathy (HCC) 12/09/2013   Diabetic polyneuropathy associated with type 2 diabetes mellitus (HCC) 12/09/2013   Type 2 diabetes mellitus with stage 4 chronic kidney disease, with long-term current use of insulin (HCC) 04/21/2013   Disturbance of skin sensation 04/21/2013   Hypothyroidism 10/30/2012   Breast mass in female-left 03/10/2012   Weight loss 11/28/2011   Increased frequency of urination 10/16/2011   Urinary urgency 10/16/2011   Pelvic pain in female 10/16/2011   Family history of breast cancer 08/01/2010   Essential hypertension 02/01/2009   EDEMA LEGS 02/01/2009    Past Medical History:  Diagnosis Date   Anemia in chronic kidney disease 05/01/2015   Anxiety    Arthritis    Bladder irritation    Diabetes mellitus    GERD (gastroesophageal reflux disease)    Hypercholesterolemia    Hypertension    Hypothyroidism    Iron deficiency anemia, unspecified  Psoriasis    Transfusion history 03/05/2016   for postoperative (ORIF) anemia superimposed on chronic anemia    Family History  Problem Relation Age of Onset   Heart disease Mother        heart attack   Stroke Father        brain hemorrhage   Diabetes Brother     Breast cancer Sister    Cancer Sister        breast cancer   Past Surgical History:  Procedure Laterality Date   ABDOMINAL HYSTERECTOMY     TAH   BREAST BIOPSY     Left breast biopsy benign lesion   BREAST BIOPSY  03/11/2012   Procedure: BREAST BIOPSY;  Surgeon: Adolph Pollack, MD;  Location: [facility] OR;  Service: General;  Laterality: Left;  remove left breast mass   BREAST EXCISIONAL BIOPSY Left    CARDIAC CATHETERIZATION N/A 01/02/2015   Procedure: Left Heart Cath and Coronary Angiography;  Surgeon: Rinaldo Cloud, MD;  Location: [facility] INVASIVE CV LAB;  Service: Cardiovascular;  Laterality: N/A;   CATARACT EXTRACTION     CERVICAL DISC SURGERY     COLONOSCOPY  2013   neg. next 2023.    EYE SURGERY     IR KYPHO EA ADDL LEVEL THORACIC OR LUMBAR  09/09/2019   IR KYPHO LUMBAR INC FX REDUCE BONE BX UNI/BIL CANNULATION INC/IMAGING  09/09/2019   IR RADIOLOGIST EVAL & MGMT  09/08/2019   LEFT HIP SURGERY     TOTAL HIP ARTHROPLASTY Left 03/02/2016   Procedure: TOTAL HIP ARTHROPLASTY ANTERIOR APPROACH;  Surgeon: Samson Frederic, MD;  Location: [facility] OR;  Service: Orthopedics;  Laterality: Left;   Social History   Social History Narrative   Patient is married with one child.   Patient is right handed.   Patient has college education.   Patient drinks tea, two times a day   Immunization History  Administered Date(s) Administered   Fluad Quad(high Dose 65+) 12/22/2018, 01/31/2022   Influenza Split 11/13/2010   Influenza, High Dose Seasonal PF 01/25/2016, 12/24/2016, 01/03/2018   Influenza,inj,Quad PF,6+ Mos 12/29/2012, 12/20/2013, 01/16/2015   Influenza,inj,quad, With Preservative 01/03/2018, 12/24/2018   Pneumococcal Conjugate-13 01/16/2015   Pneumococcal Polysaccharide-23 03/24/2013     Objective: Vital Signs: There were no vitals taken for this visit.   Physical Exam   Musculoskeletal Exam: ***  CDAI Exam: CDAI Score: -- Patient Global: --; Provider Global: -- Swollen: --; Tender:  -- Joint Exam 10/04/2022   No joint exam has been documented for this visit   There is currently no information documented on the homunculus. Go to the Rheumatology activity and complete the homunculus joint exam.  Investigation: No additional findings.  Imaging: VAS [place] LOWER EXTREMITY VENOUS REFLUX  Result Date: 09/17/2022  Lower Venous Reflux Study Patient Name:  Abigail Hoover  Date of Exam:   09/17/2022 Medical Rec #: [id number]        Accession #:    [id number] Date of Birth: 06/11/1945        Patient Gender: F Patient Age:   69 years Exam Location:  [facility] Procedure:      VAS [place] LOWER EXTREMITY VENOUS REFLUX Referring Phys: [name] --------------------------------------------------------------------------------  Indications: Varicosities. Other Indications: Severe back pain. Performing Technologist: Elita Quick RVT  Examination Guidelines: A complete evaluation includes B-mode imaging, spectral Doppler, color Doppler, and power Doppler as needed of all accessible portions of each vessel. Bilateral testing is considered an integral part of a complete  examination. Limited examinations for reoccurring indications may be performed as noted. The reflux portion of the exam is performed with the patient in reverse Trendelenburg. Significant venous reflux is defined as >500 ms in the superficial venous system, and >1 second in the deep venous system.  +--------------+---------+------+-----------+------------+-------------+ LEFT          Reflux NoRefluxReflux TimeDiameter cmsComments                              Yes                                       +--------------+---------+------+-----------+------------+-------------+ CFV                     yes   >1 second                           +--------------+---------+------+-----------+------------+-------------+ FV mid        no                                                   +--------------+---------+------+-----------+------------+-------------+ Popliteal     no                                                  +--------------+---------+------+-----------+------------+-------------+ GSV at SFJ              yes    >500 ms      0.66                  +--------------+---------+------+-----------+------------+-------------+ GSV prox thighno                            0.41                  +--------------+---------+------+-----------+------------+-------------+ GSV mid thigh no                            0.45                  +--------------+---------+------+-----------+------------+-------------+ GSV dist thighno                            0.36                  +--------------+---------+------+-----------+------------+-------------+ GSV at knee   no                            0.32                  +--------------+---------+------+-----------+------------+-------------+ GSV prox calf no                            0.34                  +--------------+---------+------+-----------+------------+-------------+ GSV mid calf  no                            0.29                  +--------------+---------+------+-----------+------------+-------------+ SSV Pop Fossa no                            0.34                  +--------------+---------+------+-----------+------------+-------------+ SSV prox calf no                            0.21                  +--------------+---------+------+-----------+------------+-------------+ SSV mid calf  no                            0.23                  +--------------+---------+------+-----------+------------+-------------+ AASVO                   yes    >500 ms      0.32                  +--------------+---------+------+-----------+------------+-------------+ AASV P                  yes    >500 ms      0.29    out of fascia  +--------------+---------+------+-----------+------------+-------------+   Summary: Left: - No evidence of superficial venous reflux seen in the left greater saphenous vein. - No evidence of superficial venous reflux seen in the left short saphenous vein. - Venous reflux is noted in the left common femoral vein. - Venous reflux is noted in the left sapheno-femoral junction. - Venous reflux is noted in the AASV .  *See table(s) above for measurements and observations. Electronically signed by [name] on 09/17/2022 at 4:52:40 PM.    Final    MR LUMBAR SPINE WO CONTRAST  Result Date: 09/16/2022 CLINICAL DATA:  Low back pain with bilateral leg pain EXAM: MRI LUMBAR SPINE WITHOUT CONTRAST TECHNIQUE: Multiplanar, multisequence MR imaging of the lumbar spine was performed. No intravenous contrast was administered. COMPARISON:  06/06/2022 FINDINGS: Segmentation: In keeping with the prior CT and MRI, there are 5 lumbar type vertebral bodies, with the last fully formed disc space labeled L5-S1. Alignment: Straightening of the normal lumbar lordosis. Mild dextrocurvature. No significant listhesis. Vertebrae: Redemonstrated kyphoplasty at L4 and L5. No acute fracture, evidence of discitis, or suspicious osseous lesion. Conus medullaris and cauda equina: Conus extends to the L1 level. Conus and cauda equina appear normal. Paraspinal and other soft tissues: Paraspinous muscle atrophy. No lymphadenopathy. Disc levels: T12-L1: No significant disc bulge. No spinal canal stenosis or neural foraminal narrowing. L1-L2: No significant disc bulge. No spinal canal stenosis or neural foraminal narrowing. L2-L3: No significant disc bulge. No spinal canal stenosis or neural foraminal narrowing. L3-L4: No significant disc bulge. Mild facet arthropathy. No spinal canal stenosis or neural foraminal narrowing. L4-L5: No significant disc bulge. Mild facet arthropathy. No spinal canal stenosis. Mild-to-moderate left neural foraminal  narrowing, unchanged. L5-S1: No significant disc bulge. Left-greater-than-right lateral disc osteophyte complex, which may contact the exiting L5 nerves. Mild facet arthropathy. No spinal canal stenosis or  neural foraminal narrowing. IMPRESSION: 1. L4-L5 mild-to-moderate left neural foraminal narrowing. 2. L5-S1 left-greater-than-right lateral disc osteophyte complex, which may contact the exiting L5 nerves. Electronically Signed   By: Wiliam Ke M.D.   On: 09/16/2022 20:39   CT ABDOMEN PELVIS WO CONTRAST  Result Date: 08/26/2022 CLINICAL DATA:  Bowel obstruction suspected.  Pain. EXAM: CT ABDOMEN AND PELVIS WITHOUT CONTRAST TECHNIQUE: Multidetector CT imaging of the abdomen and pelvis was performed following the standard protocol without IV contrast. RADIATION DOSE REDUCTION: This exam was performed according to the departmental dose-optimization program which includes automated exposure control, adjustment of the mA and/or kV according to patient size and/or use of iterative reconstruction technique. COMPARISON:  CT abdomen and pelvis 05/09/2022 FINDINGS: Lower chest: No acute abnormality. Hepatobiliary: No focal liver abnormality is seen. No gallstones, gallbladder wall thickening, or biliary dilatation. Pancreas: Unremarkable. No pancreatic ductal dilatation or surrounding inflammatory changes. Spleen: Normal in size without focal abnormality. Adrenals/Urinary Tract: Adrenal glands are unremarkable. Kidneys are normal, without renal calculi, focal lesion, or hydronephrosis. Bladder is unremarkable. Stomach/Bowel: There is a small hiatal hernia. Stomach is within normal limits. Appendix appears normal. No evidence of bowel wall thickening, distention, or inflammatory changes. Vascular/Lymphatic: Aortic atherosclerosis. No enlarged abdominal or pelvic lymph nodes. Reproductive: Status post hysterectomy. No adnexal masses. Other: No abdominal wall hernia or abnormality. No abdominopelvic ascites.  Musculoskeletal: There are vertebroplasty changes at L4 and L5. Trace compression deformity of L3 appears unchanged. Left hip arthroplasty is present. IMPRESSION: 1. No acute localizing process in the abdomen or pelvis. 2. Small hiatal hernia. Aortic Atherosclerosis (ICD10-I70.0). Electronically Signed   By: Darliss Cheney M.D.   On: 08/26/2022 16:12   CT L-SPINE NO CHARGE  Result Date: 08/26/2022 CLINICAL DATA:  Low back pain.  Increased fracture risk. EXAM: CT LUMBAR SPINE WITHOUT CONTRAST TECHNIQUE: Multidetector CT imaging of the lumbar spine was performed without intravenous contrast administration. Multiplanar CT image reconstructions were also generated. RADIATION DOSE REDUCTION: This exam was performed according to the departmental dose-optimization program which includes automated exposure control, adjustment of the mA and/or kV according to patient size and/or use of iterative reconstruction technique. COMPARISON:  Lumbar spine CT T10-11 2010. MRI lumbar spine 06/06/2022. FINDINGS: Segmentation: Conventional numbering is assumed with 5 non-rib-bearing, lumbar type vertebral bodies. Alignment: Normal. Vertebrae: Prior L4 and L5 kyphoplasties. No new vertebral body height loss. Acquired fusion of the posterior elements from L2-S1 bilaterally. Mild degenerative changes of the bilateral sacroiliac joints. Paraspinal and other soft tissues: Aortic atherosclerosis. Disc levels: Unchanged moderate left neural foraminal narrowing at L4-5. No spinal canal stenosis in the lumbar spine. IMPRESSION: 1. Prior L4 and L5 kyphoplasties. No new vertebral body height loss. 2. Unchanged moderate left neural foraminal narrowing at L4-5. No spinal canal stenosis in the lumbar spine. 3. Acquired fusion of the posterior elements from L2-S1 bilaterally. Aortic Atherosclerosis (ICD10-I70.0). Electronically Signed   By: Orvan Falconer M.D.   On: 08/26/2022 16:07    Recent Labs: Lab Results  Component Value Date   WBC 5.6  08/26/2022   HGB 11.7 (L) 08/26/2022   PLT 228 08/26/2022   NA 134 (L) 08/26/2022   K 4.4 08/26/2022   CL 102 08/26/2022   CO2 23 08/26/2022   GLUCOSE 162 (H) 08/26/2022   BUN 18 08/26/2022   CREATININE 1.82 (H) 08/26/2022   BILITOT 0.4 07/23/2022   ALKPHOS 68 04/29/2019   AST 14 07/23/2022   ALT 6 07/23/2022   PROT 6.1 07/23/2022   ALBUMIN 4.0  04/29/2019   CALCIUM 10.6 (H) 08/26/2022   GFRAA 25 (L) 09/20/2019    Speciality Comments: Forteo started 09/29/2019 Prolia started 07/05/21  Procedures:  No procedures performed Allergies: Atorvastatin and Ezetimibe   Assessment / Plan:     Visit Diagnoses: No diagnosis found.  Orders: No orders of the defined types were placed in this encounter.  No orders of the defined types were placed in this encounter.   Face-to-face time spent with patient was *** minutes. Greater than 50% of time was spent in counseling and coordination of care.  Follow-Up Instructions: No follow-ups on file.   Ellen Henri, CMA  Note - This record has been created using Animal nutritionist.  Chart creation errors have been sought, but may not always  have been located. Such creation errors do not reflect on  the standard of medical care.

## 2022-09-27 ENCOUNTER — Ambulatory Visit: Payer: Medicare Other

## 2022-09-30 DIAGNOSIS — E1122 Type 2 diabetes mellitus with diabetic chronic kidney disease: Secondary | ICD-10-CM | POA: Diagnosis not present

## 2022-10-04 ENCOUNTER — Ambulatory Visit: Payer: Medicare Other | Admitting: Rheumatology

## 2022-10-04 DIAGNOSIS — M8000XS Age-related osteoporosis with current pathological fracture, unspecified site, sequela: Secondary | ICD-10-CM

## 2022-10-04 DIAGNOSIS — M170 Bilateral primary osteoarthritis of knee: Secondary | ICD-10-CM

## 2022-10-04 DIAGNOSIS — M19041 Primary osteoarthritis, right hand: Secondary | ICD-10-CM

## 2022-10-04 DIAGNOSIS — S72002A Fracture of unspecified part of neck of left femur, initial encounter for closed fracture: Secondary | ICD-10-CM

## 2022-10-04 DIAGNOSIS — I10 Essential (primary) hypertension: Secondary | ICD-10-CM

## 2022-10-04 DIAGNOSIS — D631 Anemia in chronic kidney disease: Secondary | ICD-10-CM

## 2022-10-04 DIAGNOSIS — M47816 Spondylosis without myelopathy or radiculopathy, lumbar region: Secondary | ICD-10-CM

## 2022-10-04 DIAGNOSIS — E790 Hyperuricemia without signs of inflammatory arthritis and tophaceous disease: Secondary | ICD-10-CM

## 2022-10-04 DIAGNOSIS — Z8639 Personal history of other endocrine, nutritional and metabolic disease: Secondary | ICD-10-CM

## 2022-10-04 DIAGNOSIS — M4015 Other secondary kyphosis, thoracolumbar region: Secondary | ICD-10-CM

## 2022-10-04 DIAGNOSIS — E039 Hypothyroidism, unspecified: Secondary | ICD-10-CM

## 2022-10-04 DIAGNOSIS — E1149 Type 2 diabetes mellitus with other diabetic neurological complication: Secondary | ICD-10-CM

## 2022-10-04 DIAGNOSIS — M5136 Other intervertebral disc degeneration, lumbar region: Secondary | ICD-10-CM

## 2022-10-04 DIAGNOSIS — M1120 Other chondrocalcinosis, unspecified site: Secondary | ICD-10-CM

## 2022-10-04 DIAGNOSIS — M8008XS Age-related osteoporosis with current pathological fracture, vertebra(e), sequela: Secondary | ICD-10-CM

## 2022-10-04 DIAGNOSIS — L409 Psoriasis, unspecified: Secondary | ICD-10-CM

## 2022-10-04 DIAGNOSIS — Z5181 Encounter for therapeutic drug level monitoring: Secondary | ICD-10-CM

## 2022-10-09 ENCOUNTER — Encounter (HOSPITAL_BASED_OUTPATIENT_CLINIC_OR_DEPARTMENT_OTHER): Payer: Self-pay

## 2022-10-09 ENCOUNTER — Emergency Department (HOSPITAL_BASED_OUTPATIENT_CLINIC_OR_DEPARTMENT_OTHER)
Admission: EM | Admit: 2022-10-09 | Discharge: 2022-10-09 | Disposition: A | Discharge status: Home / Self Care | Payer: Medicare Other | Attending: Emergency Medicine | Admitting: Emergency Medicine

## 2022-10-09 ENCOUNTER — Other Ambulatory Visit: Payer: Self-pay

## 2022-10-09 DIAGNOSIS — Z79899 Other long term (current) drug therapy: Secondary | ICD-10-CM | POA: Insufficient documentation

## 2022-10-09 DIAGNOSIS — Z794 Long term (current) use of insulin: Secondary | ICD-10-CM | POA: Diagnosis not present

## 2022-10-09 DIAGNOSIS — E1122 Type 2 diabetes mellitus with diabetic chronic kidney disease: Secondary | ICD-10-CM | POA: Insufficient documentation

## 2022-10-09 DIAGNOSIS — E039 Hypothyroidism, unspecified: Secondary | ICD-10-CM | POA: Insufficient documentation

## 2022-10-09 DIAGNOSIS — N189 Chronic kidney disease, unspecified: Secondary | ICD-10-CM | POA: Diagnosis not present

## 2022-10-09 DIAGNOSIS — Z7982 Long term (current) use of aspirin: Secondary | ICD-10-CM | POA: Diagnosis not present

## 2022-10-09 DIAGNOSIS — R109 Unspecified abdominal pain: Secondary | ICD-10-CM | POA: Insufficient documentation

## 2022-10-09 DIAGNOSIS — N390 Urinary tract infection, site not specified: Secondary | ICD-10-CM | POA: Diagnosis not present

## 2022-10-09 DIAGNOSIS — I129 Hypertensive chronic kidney disease with stage 1 through stage 4 chronic kidney disease, or unspecified chronic kidney disease: Secondary | ICD-10-CM | POA: Diagnosis not present

## 2022-10-09 DIAGNOSIS — D631 Anemia in chronic kidney disease: Secondary | ICD-10-CM | POA: Insufficient documentation

## 2022-10-09 DIAGNOSIS — R10A0 Flank pain, unspecified side: Secondary | ICD-10-CM

## 2022-10-09 LAB — URINALYSIS, W/ REFLEX TO CULTURE (INFECTION SUSPECTED)
Bacteria, UA: NONE SEEN
Bilirubin Urine: NEGATIVE
Glucose, UA: NEGATIVE mg/dL
Hgb urine dipstick: NEGATIVE
Ketones, ur: NEGATIVE mg/dL
Nitrite: NEGATIVE
Protein, ur: NEGATIVE mg/dL
Specific Gravity, Urine: 1.007 (ref 1.005–1.030)
pH: 7.5 (ref 5.0–8.0)

## 2022-10-09 LAB — BASIC METABOLIC PANEL
Anion gap: 8 (ref 5–15)
BUN: 26 mg/dL — ABNORMAL HIGH (ref 8–23)
CO2: 27 mmol/L (ref 22–32)
Calcium: 8.3 mg/dL — ABNORMAL LOW (ref 8.9–10.3)
Chloride: 104 mmol/L (ref 98–111)
Creatinine, Ser: 1.75 mg/dL — ABNORMAL HIGH (ref 0.44–1.00)
GFR, Estimated: 29 mL/min — ABNORMAL LOW (ref >60)
Glucose, Bld: 173 mg/dL — ABNORMAL HIGH (ref 70–99)
Potassium: 4.3 mmol/L (ref 3.5–5.1)
Sodium: 139 mmol/L (ref 135–145)

## 2022-10-09 LAB — CBC
HCT: 34.4 % — ABNORMAL LOW (ref 36.0–46.0)
Hemoglobin: 10.6 g/dL — ABNORMAL LOW (ref 12.0–15.0)
MCH: 26.8 pg (ref 26.0–34.0)
MCHC: 30.8 g/dL (ref 30.0–36.0)
MCV: 87.1 fL (ref 80.0–100.0)
Platelets: 164 10*3/uL (ref 150–400)
RBC: 3.95 MIL/uL (ref 3.87–5.11)
RDW: 14.8 % (ref 11.5–15.5)
WBC: 5.7 10*3/uL (ref 4.0–10.5)
nRBC: 0 % (ref 0.0–0.2)

## 2022-10-09 NOTE — ED Triage Notes (Signed)
Pt w family, states hx UTI. Reports abd pain, went to UC & was dx w continuing UTI. Prescribed abx, but family member requesting bloodwork "to see how bad infection is if it's been going on for a month."

## 2022-10-09 NOTE — Discharge Instructions (Addendum)
Urine culture has been sent.  You are not being empirically treated.  You will hear from [place] in 2 days if it does show infection.  Follow-up with her doctor as needed.

## 2022-10-09 NOTE — ED Provider Notes (Signed)
[place] EMERGENCY DEPARTMENT AT [facility] Provider Note   CSN: [id number] Arrival date & time: 10/09/22  1754     History  Chief Complaint  Patient presents with   Abdominal Pain   Recurrent UTI    Abigail Hoover is a 77 y.o. female.   Abdominal Pain Patient with right-sided flank pain.  Some dysuria.  Has been seen for UTI and the abdominal pain.  Has been seen by ER than nephrology.  Also seen by it appears her pain doctor.  Seen in the ER and urine showed potential infection and treated with Keflex.  No culture sent.  Then reportedly had urinalysis done a week later by nephrology that still showed UTI and given Cipro.  Now a few weeks later potentially still infection.  Still symptoms.  Lumbar MRI had been reassuring.  Does have edema on her legs.  Went to urgent care and prescribed Cipro but reported came here for further evaluation.    Past Medical History:  Diagnosis Date   Anemia in chronic kidney disease 05/01/2015   Anxiety    Arthritis    Bladder irritation    Diabetes mellitus    GERD (gastroesophageal reflux disease)    Hypercholesterolemia    Hypertension    Hypothyroidism    Iron deficiency anemia, unspecified    Psoriasis    Transfusion history 03/05/2016   for postoperative (ORIF) anemia superimposed on chronic anemia   Primary Home Medications Prior to Admission medications   Medication Sig Start Date End Date Taking? Authorizing Provider  aspirin EC 81 MG tablet Take 81 mg by mouth daily. Take 1 tablet by mouth once daily.    [provider]  Cholecalciferol (VITAMIN D3) 1000 units CAPS     [provider]  colchicine 0.6 MG tablet TAKE ONE-HALF TABLET (0.3 MG TOTAL) BY MOUTH DAILY. 09/04/22   Gearldine Bienenstock, PA-C  Continuous Blood Gluc Sensor (DEXCOM G7 SENSOR) MISC Inject 1 sensor to the skin every 10 days for continuous glucose monitoring. 01/29/22   [provider]  Darbepoetin Alfa (ARANESP) 60 MCG/0.3ML SOSY  injection Inject 60 mcg into the skin.    [provider]  denosumab (PROLIA) 60 MG/ML SOSY injection Inject 60 mg into the skin every 6 (six) months. Courier to rheum: [address], [address], [place] [address]. Appt 08/06/22 07/31/22   Pollyann Savoy, MD  docusate sodium (COLACE) 100 MG capsule Take 400 mg by mouth daily.    [provider]  famotidine (PEPCID) 40 MG tablet Take 40 mg by mouth 2 (two) times daily.    [provider]  furosemide (LASIX) 40 MG tablet Take 1 tablet by mouth 2 (two) times daily.    [provider]  gabapentin (NEURONTIN) 300 MG capsule Take 300 mg by mouth 3 (three) times daily. 09/29/18   [provider]  glucose blood (ONETOUCH VERIO) test strip Use Onetouch verio test strips to check blood sugar three times daily. (90 day RX) 05/04/21   Carlus Pavlov, MD  hydrocortisone 2.5 % ointment     [provider]  Insulin Pen Needle (PEN NEEDLES 3/16") 31G X 5 MM MISC Use 1 pen needle to inject Forteo daily. 07/20/20   Pollyann Savoy, MD  Insulin Pen Needle 31G X 5 MM MISC Use with Novolog 05/28/19   Reather Littler, MD  Insulin Syringe-Needle U-100 25G X 5/8" 1 ML MISC USE DAILY TO INJECT INSULIN 05/26/18   Reather Littler, MD  isosorbide  mononitrate (IMDUR) 120 MG 24 hr tablet Take 1 tablet by mouth every morning.    [provider]  Lancet Devices (ONE TOUCH DELICA LANCING DEV) MISC 1 each by Does not apply route daily. Use as instructed to check blood sugar once daily. 08/06/19   Reather Littler, MD  Lancets [facility] DELICA PLUS Minor) MISC 4 (four) times daily. 12/07/21   [provider]  metoprolol succinate (TOPROL-XL) 50 MG 24 hr tablet Take 1 tablet (50 mg total) by mouth daily. Take with or immediately following a meal. 01/04/15   Rinaldo Cloud, MD  mirabegron ER (MYRBETRIQ) 50 MG TB24 tablet Take by mouth. 09/29/18   [provider]  nitroGLYCERIN (NITROSTAT) 0.4 MG SL tablet PLACE 1 TAB  UNDER TONGUE FOR CHEST PAIN EVERY 5 MINUTES     MAXIMUM OF 3 DOSES     CALL 911 AFTER FIRST DOSE. 07/23/22   Patwardhan, Manish J, MD  NOVOLOG FLEXPEN 100 UNIT/ML FlexPen INJECT 9-11 UNITS UNDER THE SKIN BEFORE the 3 meals 02/19/21   Carlus Pavlov, MD  pantoprazole (PROTONIX) 40 MG tablet Take 40 mg by mouth every morning. 07/19/22   [provider]  pentosan polysulfate (ELMIRON) 100 MG capsule TAKE 1 CAPSULE (100 MG TOTAL) BY MOUTH 3 TIMES DAILY BEFORE MEALS. 09/29/18   [provider]  pravastatin (PRAVACHOL) 20 MG tablet TAKE ONE TABLET BY MOUTH ONCE DAILY 07/30/21   Carlus Pavlov, MD  pravastatin (PRAVACHOL) 20 MG tablet Take 1 tablet by mouth daily. 09/06/21   [provider]  sitaGLIPtin (JANUVIA) 25 MG tablet Take 25 mg by mouth daily.    [provider]  sucralfate (CARAFATE) 1 g tablet 1 tab(s) crush and mix in water 4 times a day (between meals and at bedtime) for 90 days 10/02/20   [provider]  SYNTHROID 75 MCG tablet TAKE ONE TABLET BY MOUTH ONCE DAILY 06/26/21   Carlus Pavlov, MD  torsemide (DEMADEX) 20 MG tablet Take 1 tablet by mouth daily.    [provider]  triamcinolone (KENALOG) 0.025 % ointment Apply 1 application  topically 2 (two) times daily. 04/25/21   [provider]  Vitamin D, Ergocalciferol, (DRISDOL) 1.25 MG (50000 UNIT) CAPS capsule TAKE 1 CAPSULE (50,000 UNITS TOTAL) BY MOUTH EVERY 7 (SEVEN) DAYS. 05/20/22   Horton Chin, MD      Allergies    Atorvastatin and Ezetimibe    Review of Systems   Review of Systems  Gastrointestinal:  Positive for abdominal pain.    Physical Exam Updated Vital Signs BP (!) 133/56   Pulse 64   Temp 98.4 F (36.9 C)   Resp 16   SpO2 100%  Physical Exam Vitals and nursing note reviewed.  Cardiovascular:     Rate and Rhythm: Regular rhythm.  Pulmonary:     Breath sounds: Normal breath sounds.  Abdominal:     Tenderness: There is no abdominal tenderness.      Hernia: No hernia is present.  Genitourinary:    Comments: Mild CVA tenderness.  No rash.  Somewhat worse with movements. Neurological:     Mental Status: She is alert.     ED Results / Procedures / Treatments   Labs (all labs ordered are listed, but only abnormal results are displayed) Labs Reviewed  BASIC METABOLIC PANEL - Abnormal; Notable for the following components:      Result Value   Glucose, Bld 173 (*)    BUN 26 (*)    Creatinine, Ser  1.75 (*)    Calcium 8.3 (*)    GFR, Estimated 29 (*)    All other components within normal limits  CBC - Abnormal; Notable for the following components:   Hemoglobin 10.6 (*)    HCT 34.4 (*)    All other components within normal limits  URINALYSIS, W/ REFLEX TO CULTURE (INFECTION SUSPECTED) - Abnormal; Notable for the following components:   Color, Urine COLORLESS (*)    Leukocytes,Ua SMALL (*)    All other components within normal limits  URINE CULTURE    EKG None  Radiology No results found.  Procedures Procedures    Medications Ordered in ED Medications - No data to display  ED Course/ Medical Decision Making/ A&P                             Medical Decision Making Amount and/or Complexity of Data Reviewed Labs: ordered.   Patient with continued right flank pain.  Has been treated twice for UTI.  However cultures have not been done.  Today there is some white cells but no bacteria.  Had did not have bacteria a month ago either.  Not sure if this is necessarily even an infection.  Will send culture but will not empirically treat.  Well-appearing and no systemic white count.  No rash so doubt shingles.  Potentially could be musculoskeletal pain.  Reassuring since CT scan done and did not show other clear cause previously.  Urgent care had prescribed Cipro today but for now discussed with family and will hold off for now pending the culture.   However has had some dysuria and if the culture came back positive I  would treat The number to call would be the patient's mobile number which is actually the daughter's number.        Final Clinical Impression(s) / ED Diagnoses Final diagnoses:  Flank pain    Rx / DC Orders ED Discharge Orders     None         Benjiman Core, MD 10/09/22 2045

## 2022-10-14 DIAGNOSIS — R300 Dysuria: Secondary | ICD-10-CM | POA: Diagnosis not present

## 2022-10-14 DIAGNOSIS — R3915 Urgency of urination: Secondary | ICD-10-CM | POA: Diagnosis not present

## 2022-10-14 DIAGNOSIS — R350 Frequency of micturition: Secondary | ICD-10-CM | POA: Diagnosis not present

## 2022-10-15 ENCOUNTER — Telehealth (HOSPITAL_COMMUNITY): Payer: Self-pay

## 2022-10-15 NOTE — Telephone Encounter (Signed)
Pt.s daughter called and wanted results from her mothers urine culture from ED visit on 7/172024 at [facility] ED   Pt. ID verified and after reviewing labs, I did not see any urine culture resulted.  I gave pt.s daughter the information and phone number to contact the Pt. Placement for further information/ [facility] care management, [phone].

## 2022-10-17 ENCOUNTER — Telehealth: Payer: Self-pay | Admitting: Rheumatology

## 2022-10-17 NOTE — Telephone Encounter (Signed)
Patient's daughter called requesting prescription refill of Colchicine to be sent to [facility] in [place].

## 2022-10-17 NOTE — Telephone Encounter (Signed)
Prescription sent in on 09/04/2022 with 2 additional refills.   Returned call to [name] and advised prescription was sent to the pharmacy with 2 additional refills. Advised she should contact the pharmacy and they should be able to refill it. She expressed understanding.

## 2022-10-18 ENCOUNTER — Telehealth: Payer: Self-pay

## 2022-10-18 NOTE — Telephone Encounter (Signed)
Transition Care Management Unsuccessful Follow-up Telephone Call  Date of discharge and from where:  10/09/2022 [facility]  Attempts:  1st Attempt  Reason for unsuccessful TCM follow-up call:  Left voice message   [facility]  [facility] Population Health Community Resource Care Guide   [email or web address]  ?? [phone]   Website: [email or web address]  [email or web address]

## 2022-10-18 NOTE — Telephone Encounter (Signed)
Transition Care Management Follow-up Telephone Call Date of discharge and from where: 10/09/2022 [facility] How have you been since you were released from the hospital? Patient is still in pain. Any questions or concerns? No  Items Reviewed: Did the pt receive and understand the discharge instructions provided? Yes  Medications obtained and verified?  No medication prescribed. Other? No  Any new allergies since your discharge? No  Dietary orders reviewed? Yes Do you have support at home? Yes   Follow up appointments reviewed:  PCP Hospital f/u appt confirmed?  Patient's daughter [name] left message for prescription refill  Scheduled to see  on  @ . Specialist Hospital f/u appt confirmed? Yes  Scheduled to see Mardelle Matte, NP on 10/14/2022 @ [facility]. Are transportation arrangements needed? No  If their condition worsens, is the pt aware to call PCP or go to the Emergency Dept.? Yes Was the patient provided with contact information for the PCP's office or ED? Yes Was to pt encouraged to call back with questions or concerns? Yes  Vear Staton [facility]  [facility] Population Health Community Resource Care Guide   [email or web address]  ?? [phone]   Website: [email or web address]  [email or web address]

## 2022-10-24 NOTE — Progress Notes (Signed)
Office Visit Note  Patient: Abigail Hoover             Date of Birth: July 22, 1945           MRN: [id number]             PCP: Pollyann Savoy, MD Referring: Pollyann Savoy, MD Visit Date: 11/06/2022 Occupation: @GUAROCC @  Subjective:  Medication management  History of Present Illness: Abigail Hoover is a 77 y.o. female with osteoarthritis, degenerative disc disease, chondrocalcinosis and osteoporosis.  She states she continues to have neck and lower back pain.  She had minimal relief from physical therapy.  She has a stiffness in her hands and her knee joints.  She has not noticed any joint swelling.  She has not had a flare of pseudogout since she has been taking colchicine 0.6 mg, half tablet daily.  She requested a refill on colchicine.  She has been getting Prolia injections every 6 months which she has been tolerating well.  She states that she sees Dr. Allena Katz on a regular basis for chronic renal insufficiency.  He prescribed vitamin D prescription to her for vitamin D deficiency.  Patient has not picked up the prescription yet.    Activities of Daily Living:  Patient reports morning stiffness for 0 minute.   Patient Denies nocturnal pain.  Difficulty dressing/grooming: Denies Difficulty climbing stairs: Denies Difficulty getting out of chair: Denies Difficulty using hands for taps, buttons, cutlery, and/or writing: Denies  Review of Systems  Constitutional:  Positive for fatigue.  HENT:  Negative for mouth sores and mouth dryness.   Eyes:  Negative for dryness.  Respiratory:  Negative for shortness of breath.   Cardiovascular:  Negative for chest pain and palpitations.  Gastrointestinal:  Negative for blood in stool, constipation and diarrhea.  Endocrine: Negative for increased urination.  Genitourinary:  Negative for involuntary urination.  Musculoskeletal:  Positive for joint pain and joint pain. Negative for gait problem, joint swelling, myalgias, muscle weakness,  morning stiffness, muscle tenderness and myalgias.  Skin:  Negative for color change, rash, hair loss and sensitivity to sunlight.  Allergic/Immunologic: Negative for susceptible to infections.  Neurological:  Negative for dizziness and headaches.  Hematological:  Negative for swollen glands.  Psychiatric/Behavioral:  Negative for depressed mood and sleep disturbance. The patient is not nervous/anxious.     PMFS History:  Patient Active Problem List   Diagnosis Date Noted   Leg edema 07/29/2022   Chronic idiopathic constipation 07/02/2022   Constipation 07/02/2022   Right lower quadrant pain 07/02/2022   Osteoarthritis of knee 03/06/2022   Preventive measure 01/31/2022   Precordial pain 01/16/2022   Stable angina 01/16/2022   Chest pain on breathing 12/07/2021   Inflammation of joint of both hands 10/26/2020   DDD (degenerative disc disease), lumbar 08/26/2019   Age-related osteoporosis without current pathological fracture 08/26/2019   Primary osteoarthritis of both knees 08/26/2019   Primary osteoarthritis of both hands 08/26/2019   Diabetes mellitus with chronic kidney disease, with long-term current use of insulin (HCC) 05/11/2019   Pain due to onychomycosis of toenails of both feet 09/30/2018   Trigger thumb of right hand 09/02/2018   Chronic pain of right thumb 08/27/2018   Pain in finger of right hand 06/01/2018   Plantar fasciitis of left foot 05/19/2018   Lower limb pain, inferior, left 04/28/2018   Dry skin 03/12/2018   Neck pain 01/27/2018   Other fatigue 09/18/2017   Pain and swelling of right wrist 08/06/2017  At high risk for breast cancer 03/06/2017   Hypercalcemia 12/16/2016   Vitamin B 12 deficiency 12/16/2016   Displaced fracture of left femoral neck (HCC) 03/02/2016   Hyperkalemia 03/01/2016   Femoral neck fracture (HCC) 02/29/2016   Throat burning 01/23/2016   Anemia due to stage 3 (moderate) chronic renal failure treated with erythropoietin (HCC)  12/18/2015   Chronic kidney disease, stage III (moderate) (HCC) 08/14/2015   Anemia in chronic kidney disease 05/01/2015   Acute coronary syndrome (HCC) 01/02/2015   Lumbar spondylosis with myelopathy 06/27/2014   Deficiency anemia 05/27/2014   Carpal tunnel syndrome 12/09/2013   Diabetic neuropathy (HCC) 12/09/2013   Diabetic polyneuropathy associated with type 2 diabetes mellitus (HCC) 12/09/2013   Type 2 diabetes mellitus with stage 4 chronic kidney disease, with long-term current use of insulin (HCC) 04/21/2013   Disturbance of skin sensation 04/21/2013   Hypothyroidism 10/30/2012   Breast mass in female-left 03/10/2012   Weight loss 11/28/2011   Increased frequency of urination 10/16/2011   Urinary urgency 10/16/2011   Pelvic pain in female 10/16/2011   Family history of breast cancer 08/01/2010   Essential hypertension 02/01/2009   EDEMA LEGS 02/01/2009    Past Medical History:  Diagnosis Date   Anemia in chronic kidney disease 05/01/2015   Anxiety    Arthritis    Bladder irritation    Diabetes mellitus    GERD (gastroesophageal reflux disease)    Hypercholesterolemia    Hypertension    Hypothyroidism    Iron deficiency anemia, unspecified    Psoriasis    Transfusion history 03/05/2016   for postoperative (ORIF) anemia superimposed on chronic anemia    Family History  Problem Relation Age of Onset   Heart disease Mother        heart attack   Stroke Father        brain hemorrhage   Diabetes Brother    Breast cancer Sister    Cancer Sister        breast cancer   Past Surgical History:  Procedure Laterality Date   ABDOMINAL HYSTERECTOMY     TAH   BREAST BIOPSY     Left breast biopsy benign lesion   BREAST BIOPSY  03/11/2012   Procedure: BREAST BIOPSY;  Surgeon: Adolph Pollack, MD;  Location: [facility] OR;  Service: General;  Laterality: Left;  remove left breast mass   BREAST EXCISIONAL BIOPSY Left    CARDIAC CATHETERIZATION N/A 01/02/2015   Procedure: Left  Heart Cath and Coronary Angiography;  Surgeon: Rinaldo Cloud, MD;  Location: [facility] INVASIVE CV LAB;  Service: Cardiovascular;  Laterality: N/A;   CATARACT EXTRACTION     CERVICAL DISC SURGERY     COLONOSCOPY  2013   neg. next 2023.    EYE SURGERY     IR KYPHO EA ADDL LEVEL THORACIC OR LUMBAR  09/09/2019   IR KYPHO LUMBAR INC FX REDUCE BONE BX UNI/BIL CANNULATION INC/IMAGING  09/09/2019   IR RADIOLOGIST EVAL & MGMT  09/08/2019   LEFT HIP SURGERY     TOTAL HIP ARTHROPLASTY Left 03/02/2016   Procedure: TOTAL HIP ARTHROPLASTY ANTERIOR APPROACH;  Surgeon: Samson Frederic, MD;  Location: [facility] OR;  Service: Orthopedics;  Laterality: Left;   Social History   Social History Narrative   Patient is married with one child.   Patient is right handed.   Patient has college education.   Patient drinks tea, two times a day   Immunization History  Administered Date(s) Administered   Fluad Quad(high  Dose 65+) 12/22/2018, 01/31/2022   Influenza Split 11/13/2010   Influenza, High Dose Seasonal PF 01/25/2016, 12/24/2016, 01/03/2018   Influenza,inj,Quad PF,6+ Mos 12/29/2012, 12/20/2013, 01/16/2015   Influenza,inj,quad, With Preservative 01/03/2018, 12/24/2018   Pneumococcal Conjugate-13 01/16/2015   Pneumococcal Polysaccharide-23 03/24/2013     Objective: Vital Signs: BP 105/67 (BP Location: Left Arm, Patient Position: Sitting, Cuff Size: Normal)   Pulse (!) 55   Resp 13   Ht 4' 7.5" (1.41 m)   Wt 131 lb 3.2 oz (59.5 kg)   BMI 29.95 kg/m    Physical Exam Vitals and nursing note reviewed.  Constitutional:      Appearance: She is well-developed.  HENT:     Head: Normocephalic and atraumatic.  Eyes:     Conjunctiva/sclera: Conjunctivae normal.  Cardiovascular:     Rate and Rhythm: Normal rate and regular rhythm.     Heart sounds: Normal heart sounds.  Pulmonary:     Effort: Pulmonary effort is normal.     Breath sounds: Normal breath sounds.  Abdominal:     General: Bowel sounds are normal.      Palpations: Abdomen is soft.  Musculoskeletal:     Cervical back: Normal range of motion.     Right lower leg: Edema present.     Left lower leg: Edema present.  Lymphadenopathy:     Cervical: No cervical adenopathy.  Skin:    General: Skin is warm and dry.     Capillary Refill: Capillary refill takes less than 2 seconds.  Neurological:     Mental Status: She is alert and oriented to person, place, and time.  Psychiatric:        Behavior: Behavior normal.      Musculoskeletal Exam: She had limited lateral rotation of the cervical spine.  Thoracic kyphosis was noted.  She had no point tenderness over thoracic or lumbar spine.  She had shoulder joint abduction about 140 degrees bilaterally.  Elbow joints and wrist joints with good range of motion.  Mild PIP and DIP thickening with no synovitis was noted in the fingers.  Hip joints were difficult to assess in the seated position.  Knee joints with good range of motion without any warmth swelling or effusion.  Bilateral lower extremity edema was noted.  There was no tenderness over ankles or MTPs.  CDAI Exam: CDAI Score: -- Patient Global: --; Provider Global: -- Swollen: --; Tender: -- Joint Exam 11/06/2022   No joint exam has been documented for this visit   There is currently no information documented on the homunculus. Go to the Rheumatology activity and complete the homunculus joint exam.  Investigation: No additional findings.  Imaging: No results found.  Recent Labs: Lab Results  Component Value Date   WBC 5.7 10/09/2022   HGB 10.6 (L) 10/09/2022   PLT 164 10/09/2022   NA 139 10/09/2022   K 4.3 10/09/2022   CL 104 10/09/2022   CO2 27 10/09/2022   GLUCOSE 173 (H) 10/09/2022   BUN 26 (H) 10/09/2022   CREATININE 1.75 (H) 10/09/2022   BILITOT 0.4 07/23/2022   ALKPHOS 68 04/29/2019   AST 14 07/23/2022   ALT 6 07/23/2022   PROT 6.1 07/23/2022   ALBUMIN 4.0 04/29/2019   CALCIUM 8.3 (L) 10/09/2022   GFRAA 25 (L)  09/20/2019     Speciality Comments: Forteo started 09/29/2019 Prolia started 07/05/21  Procedures:  No procedures performed Allergies: Atorvastatin and Ezetimibe   Assessment / Plan:     Visit Diagnoses: Age-related  osteoporosis with current pathological fracture, sequela - March 27, 2022 T-score -2.4, BMD 0.587 right femoral neck, no comparison, left one third radius T-score -0.60, BMD 0.659-1% change, right total femur 8% change.  DEXA scan results were reviewed with the patient.  She has overall improvement in the bone mass density.  She has been tolerating Prolia injections without any side effects.  Last Prolia injection was on Aug 06, 2022.  Will schedule next injection in November.  I advised her to take vitamin D as prescribed by Dr. Allena Katz.  Her GFR is low and stable.  Medication monitoring encounter - started on Forteo on September 29, 2019 and completed on July 2022. Prolia started on 07/05/2021, last dose: 08/06/2022 - Plan: COMPLETE METABOLIC PANEL WITH GFR in November  Other secondary kyphosis, thoracolumbar region-she had no point tenderness.  Patient tried physical therapy with some relief.  Vertebral fracture, osteoporotic, sequela - status post kyphoplasty.  Displaced fracture of left femoral neck (HCC)  DDD (degenerative disc disease), lumbar-she continues to have some lower back discomfort.  She had no point tenderness on the examination today.  Facet arthropathy, lumbar -she continues to have some discomfort in her lower back intermittently.  Primary osteoarthritis of both hands-mild PIP and DIP thickening was noted.  She has a stiffness.  Joint protection muscle strengthening was discussed.  Primary osteoarthritis of both knees-she has chronic pain in her knee joints.  No warmth swelling or effusion was noted.  Chondrocalcinosis -she has not had a flare of chondrocalcinosis.  Prescription refill for colchicine was sent today.  Colchicine 0.3mg  by mouth  daily.  Hyperuricemia-no history of gout flare.  Psoriasis-patient denies any active lesions.  She has topical agents which she uses as needed.  History of diabetes mellitus  Essential hypertension-blood pressure was 1 5/67 today.  Hypothyroidism, unspecified type - followed by Dr. Lucianne Muss  Other diabetic neurological complication associated with type 2 diabetes mellitus (HCC)  Anemia due to stage 3 (moderate) chronic renal failure treated with erythropoietin (HCC) - followed by Dr. Kathe Mariner.  Patient states she gets labs with Dr. Allena Katz every 3 months.  Vitamin D deficiency -patient states she was given a prescription for vitamin D by Dr. Allena Katz.  Will check vitamin D in November.  Plan: VITAMIN D 25 Hydroxy (Vit-D Deficiency, Fractures)  Orders: Orders Placed This Encounter  Procedures   COMPLETE METABOLIC PANEL WITH GFR   VITAMIN D 25 Hydroxy (Vit-D Deficiency, Fractures)   Meds ordered this encounter  Medications   colchicine 0.6 MG tablet    Sig: Take 0.5 tablets (0.3 mg total) by mouth daily.    Dispense:  15 tablet    Refill:  2     Follow-Up Instructions: Return in about 6 months (around 05/09/2023) for Osteoporosis, Osteoarthritis.   Pollyann Savoy, MD  Note - This record has been created using Animal nutritionist.  Chart creation errors have been sought, but may not always  have been located. Such creation errors do not reflect on  the standard of medical care.

## 2022-10-28 DIAGNOSIS — E1142 Type 2 diabetes mellitus with diabetic polyneuropathy: Secondary | ICD-10-CM | POA: Diagnosis not present

## 2022-10-28 DIAGNOSIS — N184 Chronic kidney disease, stage 4 (severe): Secondary | ICD-10-CM | POA: Diagnosis not present

## 2022-10-28 DIAGNOSIS — E039 Hypothyroidism, unspecified: Secondary | ICD-10-CM | POA: Diagnosis not present

## 2022-10-28 DIAGNOSIS — E1122 Type 2 diabetes mellitus with diabetic chronic kidney disease: Secondary | ICD-10-CM | POA: Diagnosis not present

## 2022-10-31 DIAGNOSIS — I129 Hypertensive chronic kidney disease with stage 1 through stage 4 chronic kidney disease, or unspecified chronic kidney disease: Secondary | ICD-10-CM | POA: Diagnosis not present

## 2022-10-31 DIAGNOSIS — D631 Anemia in chronic kidney disease: Secondary | ICD-10-CM | POA: Diagnosis not present

## 2022-10-31 DIAGNOSIS — N2581 Secondary hyperparathyroidism of renal origin: Secondary | ICD-10-CM | POA: Diagnosis not present

## 2022-10-31 DIAGNOSIS — N184 Chronic kidney disease, stage 4 (severe): Secondary | ICD-10-CM | POA: Diagnosis not present

## 2022-10-31 DIAGNOSIS — E1122 Type 2 diabetes mellitus with diabetic chronic kidney disease: Secondary | ICD-10-CM | POA: Diagnosis not present

## 2022-11-01 ENCOUNTER — Ambulatory Visit: Payer: Medicare Other | Admitting: Podiatry

## 2022-11-01 ENCOUNTER — Encounter: Payer: Self-pay | Admitting: Podiatry

## 2022-11-01 DIAGNOSIS — E1142 Type 2 diabetes mellitus with diabetic polyneuropathy: Secondary | ICD-10-CM

## 2022-11-01 DIAGNOSIS — B351 Tinea unguium: Secondary | ICD-10-CM

## 2022-11-01 DIAGNOSIS — M79674 Pain in right toe(s): Secondary | ICD-10-CM

## 2022-11-01 DIAGNOSIS — M79675 Pain in left toe(s): Secondary | ICD-10-CM | POA: Diagnosis not present

## 2022-11-01 NOTE — Progress Notes (Signed)
This patient returns to my office for at risk foot care.  This patient requires this care by a professional since this patient will be at risk due to having diabetes and CKD. This patient is unable to cut nails herself since the patient cannot reach her nails.These nails are painful walking and wearing shoes.  This patient presents for at risk foot care today.  General Appearance  Alert, conversant and in no acute stress.  Vascular  Dorsalis pedis and posterior tibial  pulses are palpable  bilaterally.  Capillary return is within normal limits  bilaterally. Temperature is within normal limits  bilaterally.  Neurologic  Senn-Weinstein monofilament wire test within normal limits  bilaterally. Muscle power within normal limits bilaterally.  Nails Thick disfigured discolored nails with subungual debris  from hallux to fifth toes bilaterally. No evidence of bacterial infection or drainage bilaterally.  Orthopedic  No limitations of motion  feet .  No crepitus or effusions noted.  No bony pathology or digital deformities noted.  Skin  normotropic skin with no porokeratosis noted bilaterally.  No signs of infections or ulcers noted.     Onychomycosis  Pain in right toes  Pain in left toes  Consent was obtained for treatment procedures.   Mechanical debridement of nails 1-5  bilaterally performed with a nail nipper.  Filed with dremel without incident.    Return office visit    4 months                  Told patient to return for periodic foot care and evaluation due to potential at risk complications.   Gregory Mayer DPM   

## 2022-11-06 ENCOUNTER — Telehealth: Payer: Self-pay

## 2022-11-06 ENCOUNTER — Ambulatory Visit: Payer: Medicare Other | Attending: Rheumatology | Admitting: Rheumatology

## 2022-11-06 ENCOUNTER — Encounter: Payer: Self-pay | Admitting: Rheumatology

## 2022-11-06 VITALS — BP 105/67 | HR 55 | Resp 13 | Ht <= 58 in | Wt 131.2 lb

## 2022-11-06 DIAGNOSIS — M170 Bilateral primary osteoarthritis of knee: Secondary | ICD-10-CM

## 2022-11-06 DIAGNOSIS — E039 Hypothyroidism, unspecified: Secondary | ICD-10-CM

## 2022-11-06 DIAGNOSIS — E1149 Type 2 diabetes mellitus with other diabetic neurological complication: Secondary | ICD-10-CM

## 2022-11-06 DIAGNOSIS — M47816 Spondylosis without myelopathy or radiculopathy, lumbar region: Secondary | ICD-10-CM

## 2022-11-06 DIAGNOSIS — E559 Vitamin D deficiency, unspecified: Secondary | ICD-10-CM

## 2022-11-06 DIAGNOSIS — L409 Psoriasis, unspecified: Secondary | ICD-10-CM

## 2022-11-06 DIAGNOSIS — E790 Hyperuricemia without signs of inflammatory arthritis and tophaceous disease: Secondary | ICD-10-CM

## 2022-11-06 DIAGNOSIS — M8008XS Age-related osteoporosis with current pathological fracture, vertebra(e), sequela: Secondary | ICD-10-CM

## 2022-11-06 DIAGNOSIS — N183 Chronic kidney disease, stage 3 unspecified: Secondary | ICD-10-CM

## 2022-11-06 DIAGNOSIS — M4015 Other secondary kyphosis, thoracolumbar region: Secondary | ICD-10-CM | POA: Diagnosis not present

## 2022-11-06 DIAGNOSIS — I10 Essential (primary) hypertension: Secondary | ICD-10-CM

## 2022-11-06 DIAGNOSIS — M8000XS Age-related osteoporosis with current pathological fracture, unspecified site, sequela: Secondary | ICD-10-CM

## 2022-11-06 DIAGNOSIS — M1120 Other chondrocalcinosis, unspecified site: Secondary | ICD-10-CM

## 2022-11-06 DIAGNOSIS — D631 Anemia in chronic kidney disease: Secondary | ICD-10-CM

## 2022-11-06 DIAGNOSIS — Z8639 Personal history of other endocrine, nutritional and metabolic disease: Secondary | ICD-10-CM

## 2022-11-06 DIAGNOSIS — Z5181 Encounter for therapeutic drug level monitoring: Secondary | ICD-10-CM

## 2022-11-06 DIAGNOSIS — S72002A Fracture of unspecified part of neck of left femur, initial encounter for closed fracture: Secondary | ICD-10-CM

## 2022-11-06 DIAGNOSIS — M19042 Primary osteoarthritis, left hand: Secondary | ICD-10-CM

## 2022-11-06 DIAGNOSIS — M19041 Primary osteoarthritis, right hand: Secondary | ICD-10-CM

## 2022-11-06 DIAGNOSIS — M5136 Other intervertebral disc degeneration, lumbar region: Secondary | ICD-10-CM

## 2022-11-06 MED ORDER — COLCHICINE 0.6 MG PO TABS: 0.3000 mg | ORAL_TABLET | Freq: Every day | ORAL | 2 refills | Status: DC

## 2022-11-06 NOTE — Addendum Note (Signed)
Addended by: Pollyann Savoy on: 11/06/2022 01:58 PM   Modules accepted: Orders

## 2022-11-06 NOTE — Telephone Encounter (Signed)
Renal coordinator attempted to call patient on today regarding [facility] referral. Successful outreach made today with patient's daughter. Daughter shared that she did not know reason for referral as patient was seen last week at [facility]) by provider Dr. Allena Katz. Renal coordinator shared available resources to daughter in event patient may need them along with contact information. Daughter declined [facility] services and plans to follow up with [facility] for better understanding of where patient is with CKD.  Baruch Gouty Renal Coordinator [facility] [phone]

## 2022-11-06 NOTE — Patient Instructions (Addendum)
Please get CMP with [facility] in November for Prolia injection.  Standing Labs We placed an order today for your standing lab work.   Please have your standing labs drawn in November  Please have your labs drawn 2 weeks prior to your appointment so that the provider can discuss your lab results at your appointment, if possible.  Please note that you may see your imaging and lab results in MyChart before we have reviewed them. We will contact you once all results are reviewed. Please allow our office up to 72 hours to thoroughly review all of the results before contacting the office for clarification of your results.  WALK-IN LAB HOURS  Monday through Thursday from 8:00 am -12:30 pm and 1:00 pm-5:00 pm and Friday from 8:00 am-12:00 pm.  Patients with office visits requiring labs will be seen before walk-in labs.  You may encounter longer than normal wait times. Please allow additional time. Wait times may be shorter on  Monday and Thursday afternoons.  We do not book appointments for walk-in labs. We appreciate your patience and understanding with our staff.   Labs are drawn by [facility]. Please bring your co-pay at the time of your lab draw.  You may receive a bill from [facility] for your lab work.  Please note if you are on Hydroxychloroquine and and an order has been placed for a Hydroxychloroquine level,  you will need to have it drawn 4 hours or more after your last dose.  If you wish to have your labs drawn at another location, please call the office 24 hours in advance so we can fax the orders.  The office is located at [address], [address], [place], [place] [address]   If you have any questions regarding directions or hours of operation,  please call [phone].   As a reminder, please drink plenty of water prior to coming for your lab work. Thanks!

## 2022-11-08 ENCOUNTER — Inpatient Hospital Stay: Payer: Medicare Other | Admitting: Hematology and Oncology

## 2022-11-08 ENCOUNTER — Telehealth: Payer: Self-pay | Admitting: *Deleted

## 2022-11-08 ENCOUNTER — Inpatient Hospital Stay: Payer: Medicare Other

## 2022-11-08 ENCOUNTER — Telehealth: Payer: Self-pay | Admitting: Hematology and Oncology

## 2022-11-08 NOTE — Telephone Encounter (Signed)
Patient's daughter states Dr. Allena Katz is treating the elevated uric acid. She states she got a call from the pharmacy that a prescription has been sent to the pharmacy and is ready for pick up. She asked if patient should continue the Colchicine. Advised that she should.

## 2022-11-08 NOTE — Progress Notes (Deleted)
Office Visit Note  Patient: Abigail Hoover             Date of Birth: 1945/09/09           MRN: [id number]             PCP: Pollyann Savoy, MD Referring: Pollyann Savoy, MD Visit Date: 11/11/2022 Occupation: @GUAROCC @  Subjective:  No chief complaint on file.   History of Present Illness: SARAROSE CARN is a 77 y.o. female ***     Activities of Daily Living:  Patient reports morning stiffness for *** {minute/hour:19697}.   Patient {ACTIONS;DENIES/REPORTS:21021675::"Denies"} nocturnal pain.  Difficulty dressing/grooming: {ACTIONS;DENIES/REPORTS:21021675::"Denies"} Difficulty climbing stairs: {ACTIONS;DENIES/REPORTS:21021675::"Denies"} Difficulty getting out of chair: {ACTIONS;DENIES/REPORTS:21021675::"Denies"} Difficulty using hands for taps, buttons, cutlery, and/or writing: {ACTIONS;DENIES/REPORTS:21021675::"Denies"}  No Rheumatology ROS completed.   PMFS History:  Patient Active Problem List   Diagnosis Date Noted   Leg edema 07/29/2022   Chronic idiopathic constipation 07/02/2022   Constipation 07/02/2022   Right lower quadrant pain 07/02/2022   Osteoarthritis of knee 03/06/2022   Preventive measure 01/31/2022   Precordial pain 01/16/2022   Stable angina 01/16/2022   Chest pain on breathing 12/07/2021   Inflammation of joint of both hands 10/26/2020   DDD (degenerative disc disease), lumbar 08/26/2019   Age-related osteoporosis without current pathological fracture 08/26/2019   Primary osteoarthritis of both knees 08/26/2019   Primary osteoarthritis of both hands 08/26/2019   Diabetes mellitus with chronic kidney disease, with long-term current use of insulin (HCC) 05/11/2019   Pain due to onychomycosis of toenails of both feet 09/30/2018   Trigger thumb of right hand 09/02/2018   Chronic pain of right thumb 08/27/2018   Pain in finger of right hand 06/01/2018   Plantar fasciitis of left foot 05/19/2018   Lower limb pain, inferior, left 04/28/2018    Dry skin 03/12/2018   Neck pain 01/27/2018   Other fatigue 09/18/2017   Pain and swelling of right wrist 08/06/2017   At high risk for breast cancer 03/06/2017   Hypercalcemia 12/16/2016   Vitamin B 12 deficiency 12/16/2016   Displaced fracture of left femoral neck (HCC) 03/02/2016   Hyperkalemia 03/01/2016   Femoral neck fracture (HCC) 02/29/2016   Throat burning 01/23/2016   Anemia due to stage 3 (moderate) chronic renal failure treated with erythropoietin (HCC) 12/18/2015   Chronic kidney disease, stage III (moderate) (HCC) 08/14/2015   Anemia in chronic kidney disease 05/01/2015   Acute coronary syndrome (HCC) 01/02/2015   Lumbar spondylosis with myelopathy 06/27/2014   Deficiency anemia 05/27/2014   Carpal tunnel syndrome 12/09/2013   Diabetic neuropathy (HCC) 12/09/2013   Diabetic polyneuropathy associated with type 2 diabetes mellitus (HCC) 12/09/2013   Type 2 diabetes mellitus with stage 4 chronic kidney disease, with long-term current use of insulin (HCC) 04/21/2013   Disturbance of skin sensation 04/21/2013   Hypothyroidism 10/30/2012   Breast mass in female-left 03/10/2012   Weight loss 11/28/2011   Increased frequency of urination 10/16/2011   Urinary urgency 10/16/2011   Pelvic pain in female 10/16/2011   Family history of breast cancer 08/01/2010   Essential hypertension 02/01/2009   EDEMA LEGS 02/01/2009    Past Medical History:  Diagnosis Date   Anemia in chronic kidney disease 05/01/2015   Anxiety    Arthritis    Bladder irritation    Diabetes mellitus    GERD (gastroesophageal reflux disease)    Hypercholesterolemia    Hypertension    Hypothyroidism    Iron deficiency anemia, unspecified  Psoriasis    Transfusion history 03/05/2016   for postoperative (ORIF) anemia superimposed on chronic anemia    Family History  Problem Relation Age of Onset   Heart disease Mother        heart attack   Stroke Father        brain hemorrhage   Diabetes Brother     Breast cancer Sister    Cancer Sister        breast cancer   Past Surgical History:  Procedure Laterality Date   ABDOMINAL HYSTERECTOMY     TAH   BREAST BIOPSY     Left breast biopsy benign lesion   BREAST BIOPSY  03/11/2012   Procedure: BREAST BIOPSY;  Surgeon: Adolph Pollack, MD;  Location: [facility] OR;  Service: General;  Laterality: Left;  remove left breast mass   BREAST EXCISIONAL BIOPSY Left    CARDIAC CATHETERIZATION N/A 01/02/2015   Procedure: Left Heart Cath and Coronary Angiography;  Surgeon: Rinaldo Cloud, MD;  Location: [facility] INVASIVE CV LAB;  Service: Cardiovascular;  Laterality: N/A;   CATARACT EXTRACTION     CERVICAL DISC SURGERY     COLONOSCOPY  2013   neg. next 2023.    EYE SURGERY     IR KYPHO EA ADDL LEVEL THORACIC OR LUMBAR  09/09/2019   IR KYPHO LUMBAR INC FX REDUCE BONE BX UNI/BIL CANNULATION INC/IMAGING  09/09/2019   IR RADIOLOGIST EVAL & MGMT  09/08/2019   LEFT HIP SURGERY     TOTAL HIP ARTHROPLASTY Left 03/02/2016   Procedure: TOTAL HIP ARTHROPLASTY ANTERIOR APPROACH;  Surgeon: Samson Frederic, MD;  Location: [facility] OR;  Service: Orthopedics;  Laterality: Left;   Social History   Social History Narrative   Patient is married with one child.   Patient is right handed.   Patient has college education.   Patient drinks tea, two times a day   Immunization History  Administered Date(s) Administered   Fluad Quad(high Dose 65+) 12/22/2018, 01/31/2022   Influenza Split 11/13/2010   Influenza, High Dose Seasonal PF 01/25/2016, 12/24/2016, 01/03/2018   Influenza,inj,Quad PF,6+ Mos 12/29/2012, 12/20/2013, 01/16/2015   Influenza,inj,quad, With Preservative 01/03/2018, 12/24/2018   Pneumococcal Conjugate-13 01/16/2015   Pneumococcal Polysaccharide-23 03/24/2013     Objective: Vital Signs: There were no vitals taken for this visit.   Physical Exam   Musculoskeletal Exam: ***  CDAI Exam: CDAI Score: -- Patient Global: --; Provider Global: -- Swollen: --; Tender:  -- Joint Exam 11/11/2022   No joint exam has been documented for this visit   There is currently no information documented on the homunculus. Go to the Rheumatology activity and complete the homunculus joint exam.  Investigation: No additional findings.  Imaging: No results found.  Recent Labs: Lab Results  Component Value Date   WBC 5.7 10/09/2022   HGB 10.6 (L) 10/09/2022   PLT 164 10/09/2022   NA 139 10/09/2022   K 4.3 10/09/2022   CL 104 10/09/2022   CO2 27 10/09/2022   GLUCOSE 173 (H) 10/09/2022   BUN 26 (H) 10/09/2022   CREATININE 1.75 (H) 10/09/2022   BILITOT 0.4 07/23/2022   ALKPHOS 68 04/29/2019   AST 14 07/23/2022   ALT 6 07/23/2022   PROT 6.1 07/23/2022   ALBUMIN 4.0 04/29/2019   CALCIUM 8.3 (L) 10/09/2022   GFRAA 25 (L) 09/20/2019    Speciality Comments: Forteo started 09/29/2019 Prolia started 07/05/21  Procedures:  No procedures performed Allergies: Atorvastatin and Ezetimibe   Assessment / Plan:  Visit Diagnoses: No diagnosis found.  Orders: No orders of the defined types were placed in this encounter.  No orders of the defined types were placed in this encounter.   Face-to-face time spent with patient was *** minutes. Greater than 50% of time was spent in counseling and coordination of care.  Follow-Up Instructions: No follow-ups on file.   Ellen Henri, CMA  Note - This record has been created using Animal nutritionist.  Chart creation errors have been sought, but may not always  have been located. Such creation errors do not reflect on  the standard of medical care.

## 2022-11-08 NOTE — Telephone Encounter (Signed)
Please call patient's daughter, Maryln Manuel, [phone].

## 2022-11-08 NOTE — Telephone Encounter (Signed)
Left patient a message in regards to rescheduled appointment times/dates; left call back if needed for rescheduling

## 2022-11-11 ENCOUNTER — Ambulatory Visit: Payer: Medicare Other | Admitting: Rheumatology

## 2022-11-12 ENCOUNTER — Telehealth: Payer: Self-pay | Admitting: *Deleted

## 2022-11-12 NOTE — Telephone Encounter (Signed)
Labs received from:Dr. Zetta Bills  Drawn on:08/26/2022  Reviewed by:Dr. Pollyann Savoy   Labs drawn:CBC, Renal Panel, Magnesium, Uric Acid, UA, Vitamin D, PTH, Intact  Results:RBC 3.51  Hgb 9.4  Hct 29.7  RDW 15.8  Eos (absolute) 1.5  Glucose 139  BUN 34  Creat. 2.27  GFR 21   Uric Acid 10.3  WBC Esterase trace  WBC 6-10  Epithelial Cells (renal) 0-10  PTH, Intact 196

## 2022-11-19 DIAGNOSIS — Z0001 Encounter for general adult medical examination with abnormal findings: Secondary | ICD-10-CM | POA: Diagnosis not present

## 2022-11-19 DIAGNOSIS — E039 Hypothyroidism, unspecified: Secondary | ICD-10-CM | POA: Diagnosis not present

## 2022-11-19 DIAGNOSIS — E7800 Pure hypercholesterolemia, unspecified: Secondary | ICD-10-CM | POA: Diagnosis not present

## 2022-11-19 DIAGNOSIS — N184 Chronic kidney disease, stage 4 (severe): Secondary | ICD-10-CM | POA: Diagnosis not present

## 2022-11-19 DIAGNOSIS — D649 Anemia, unspecified: Secondary | ICD-10-CM | POA: Diagnosis not present

## 2022-11-19 DIAGNOSIS — I25119 Atherosclerotic heart disease of native coronary artery with unspecified angina pectoris: Secondary | ICD-10-CM | POA: Diagnosis not present

## 2022-11-19 DIAGNOSIS — Z23 Encounter for immunization: Secondary | ICD-10-CM | POA: Diagnosis not present

## 2022-11-27 DIAGNOSIS — R350 Frequency of micturition: Secondary | ICD-10-CM | POA: Diagnosis not present

## 2022-11-28 ENCOUNTER — Telehealth: Payer: Self-pay

## 2022-11-28 ENCOUNTER — Ambulatory Visit: Payer: Medicare Other | Admitting: Cardiology

## 2022-11-28 ENCOUNTER — Inpatient Hospital Stay: Payer: Medicare Other

## 2022-11-28 ENCOUNTER — Encounter: Payer: Self-pay | Admitting: Hematology and Oncology

## 2022-11-28 ENCOUNTER — Inpatient Hospital Stay: Payer: Medicare Other | Attending: Hematology and Oncology

## 2022-11-28 ENCOUNTER — Inpatient Hospital Stay: Payer: Medicare Other | Admitting: Hematology and Oncology

## 2022-11-28 VITALS — BP 122/48 | HR 67 | Temp 97.7°F | Resp 17 | Ht <= 58 in | Wt 131.2 lb

## 2022-11-28 DIAGNOSIS — E538 Deficiency of other specified B group vitamins: Secondary | ICD-10-CM | POA: Insufficient documentation

## 2022-11-28 DIAGNOSIS — D539 Nutritional anemia, unspecified: Secondary | ICD-10-CM

## 2022-11-28 DIAGNOSIS — N183 Chronic kidney disease, stage 3 unspecified: Secondary | ICD-10-CM

## 2022-11-28 DIAGNOSIS — D631 Anemia in chronic kidney disease: Secondary | ICD-10-CM | POA: Insufficient documentation

## 2022-11-28 DIAGNOSIS — Z803 Family history of malignant neoplasm of breast: Secondary | ICD-10-CM | POA: Diagnosis not present

## 2022-11-28 DIAGNOSIS — Z79899 Other long term (current) drug therapy: Secondary | ICD-10-CM | POA: Insufficient documentation

## 2022-11-28 LAB — CBC WITH DIFFERENTIAL (CANCER CENTER ONLY)
Abs Immature Granulocytes: 0.04 10*3/uL (ref 0.00–0.07)
Basophils Absolute: 0 10*3/uL (ref 0.0–0.1)
Basophils Relative: 0 %
Eosinophils Absolute: 0.9 10*3/uL — ABNORMAL HIGH (ref 0.0–0.5)
Eosinophils Relative: 15 %
HCT: 29 % — ABNORMAL LOW (ref 36.0–46.0)
Hemoglobin: 9.4 g/dL — ABNORMAL LOW (ref 12.0–15.0)
Immature Granulocytes: 1 %
Lymphocytes Relative: 26 %
Lymphs Abs: 1.6 10*3/uL (ref 0.7–4.0)
MCH: 27.7 pg (ref 26.0–34.0)
MCHC: 32.4 g/dL (ref 30.0–36.0)
MCV: 85.5 fL (ref 80.0–100.0)
Monocytes Absolute: 0.3 10*3/uL (ref 0.1–1.0)
Monocytes Relative: 4 %
Neutro Abs: 3.3 10*3/uL (ref 1.7–7.7)
Neutrophils Relative %: 54 %
Platelet Count: 214 10*3/uL (ref 150–400)
RBC: 3.39 MIL/uL — ABNORMAL LOW (ref 3.87–5.11)
RDW: 14.6 % (ref 11.5–15.5)
WBC Count: 6.1 10*3/uL (ref 4.0–10.5)
nRBC: 0 % (ref 0.0–0.2)

## 2022-11-28 LAB — FERRITIN: Ferritin: 352 ng/mL — ABNORMAL HIGH (ref 11–307)

## 2022-11-28 LAB — IRON AND IRON BINDING CAPACITY (CC-WL,HP ONLY)
Iron: 111 ug/dL (ref 28–170)
Saturation Ratios: 50 % — ABNORMAL HIGH (ref 10.4–31.8)
TIBC: 224 ug/dL — ABNORMAL LOW (ref 250–450)
UIBC: 113 ug/dL — ABNORMAL LOW (ref 148–442)

## 2022-11-28 MED ORDER — DARBEPOETIN ALFA 200 MCG/0.4ML IJ SOSY
200.0000 ug | PREFILLED_SYRINGE | Freq: Once | INTRAMUSCULAR | Status: AC
  Administered 2022-11-28: 200 ug via SUBCUTANEOUS
  Filled 2022-11-28: qty 0.4

## 2022-11-28 MED ORDER — CYANOCOBALAMIN 1000 MCG/ML IJ SOLN
1000.0000 ug | Freq: Once | INTRAMUSCULAR | Status: AC
  Administered 2022-11-28: 1000 ug via INTRAMUSCULAR
  Filled 2022-11-28: qty 1

## 2022-11-28 NOTE — Telephone Encounter (Signed)
It is not too low. No need to worry.  Regards, Dr. Rosemary Holms

## 2022-11-28 NOTE — Progress Notes (Signed)
[facility] OFFICE PROGRESS NOTE  Abigail Savoy, MD  ASSESSMENT & PLAN:  Anemia in chronic kidney disease She missed her recent injection in August I plan to see her back in 2 months for further follow-up We will continue ESA injection  Vitamin B 12 deficiency She has intermittent B12 deficiency She will receive B12 injection today along with darbepoetin injection  No orders of the defined types were placed in this encounter.   The total time spent in the appointment was 20 minutes encounter with patients including review of chart and various tests results, discussions about plan of care and coordination of care plan   All questions were answered. The patient knows to call the clinic with any problems, questions or concerns. No barriers to learning was detected.    Artis Delay, MD 9/5/20241:45 PM  INTERVAL HISTORY: Abigail Hoover 77 y.o. female returns for further evaluation for anemia chronic kidney disease requiring erythropoietin stimulating agent and B12 injection She missed her appointment last month but could not recall this She appears to have poor memory today Denies recent bleeding  SUMMARY OF HEMATOLOGIC HISTORY:  This is a patient that has been followed here since 2011 for chronic anemia In 2011, she had a bone marrow aspirate and biopsy that came back normal. The patient has been receiving Aranesp injection for chronic anemia up until 2014. It was subsequently discontinued due stability of the anemia. Starting 05/27/2014, darbepoetin was resumed at 200 g every other month to keep hemoglobin greater than 10 g. Treatment was discontinued in October 2016 and resumed in September 2017 The patient is subsequently found to have severe vitamin B12 deficiency and is started on vitamin B12 injection on December 25, 2016 along with resumption of darbepoetin injection every 5 to 6 months.  She is also seen due to family history of breast cancer She also  required intravenous iron infusion intermittently for concurrent iron deficiency anemia  I have reviewed the past medical history, past surgical history, social history and family history with the patient and they are unchanged from previous note.  ALLERGIES:  is allergic to atorvastatin and ezetimibe.  MEDICATIONS:  Current Outpatient Medications  Medication Sig Dispense Refill   aspirin EC 81 MG tablet Take 81 mg by mouth daily. Take 1 tablet by mouth once daily.     Cholecalciferol (VITAMIN D3) 1000 units CAPS      colchicine 0.6 MG tablet Take 0.5 tablets (0.3 mg total) by mouth daily. 15 tablet 2   Continuous Blood Gluc Sensor (DEXCOM G7 SENSOR) MISC Inject 1 sensor to the skin every 10 days for continuous glucose monitoring.     Cyanocobalamin (VITAMIN B-12 IJ) Inject as directed every 3 (three) months.     Darbepoetin Alfa (ARANESP) 60 MCG/0.3ML SOSY injection Inject 60 mcg into the skin.     denosumab (PROLIA) 60 MG/ML SOSY injection Inject 60 mg into the skin every 6 (six) months. Courier to rheum: [address], [address], [place] [address]. Appt 08/06/22 1 mL 0   docusate sodium (COLACE) 100 MG capsule Take 400 mg by mouth daily.     famotidine (PEPCID) 40 MG tablet Take 40 mg by mouth 2 (two) times daily.     furosemide (LASIX) 40 MG tablet Take 1 tablet by mouth 2 (two) times daily. (Patient not taking: Reported on 11/06/2022)     gabapentin (NEURONTIN) 300 MG capsule Take 300 mg by mouth 3 (three) times daily.     glucose  blood (ONETOUCH VERIO) test strip Use Onetouch verio test strips to check blood sugar three times daily. (90 day RX) 300 each 2   hydrocortisone 2.5 % ointment  (Patient not taking: Reported on 11/06/2022)     Insulin Glargine (BASAGLAR KWIKPEN) 100 UNIT/ML Inject into the skin.     Insulin Pen Needle (PEN NEEDLES 3/16") 31G X 5 MM MISC Use 1 pen needle to inject Forteo daily. 100 each 2   Insulin Pen Needle 31G X 5 MM MISC Use with Novolog 100 each 1    Insulin Syringe-Needle U-100 25G X 5/8" 1 ML MISC USE DAILY TO INJECT INSULIN 100 each 3   isosorbide mononitrate (IMDUR) 120 MG 24 hr tablet Take 1 tablet by mouth every morning.     Lancet Devices (ONE TOUCH DELICA LANCING DEV) MISC 1 each by Does not apply route daily. Use as instructed to check blood sugar once daily. 1 each 0   Lancets (ONETOUCH DELICA PLUS LANCET33G) MISC 4 (four) times daily.     metoprolol succinate (TOPROL-XL) 50 MG 24 hr tablet Take 1 tablet (50 mg total) by mouth daily. Take with or immediately following a meal. 30 tablet 3   mirabegron ER (MYRBETRIQ) 50 MG TB24 tablet Take by mouth.     nitroGLYCERIN (NITROSTAT) 0.4 MG SL tablet PLACE 1 TAB UNDER TONGUE FOR CHEST PAIN EVERY 5 MINUTES     MAXIMUM OF 3 DOSES     CALL 911 AFTER FIRST DOSE. 25 tablet 0   NOVOLOG FLEXPEN 100 UNIT/ML FlexPen INJECT 9-11 UNITS UNDER THE SKIN BEFORE the 3 meals 30 mL 3   pantoprazole (PROTONIX) 40 MG tablet Take 40 mg by mouth every morning.     pentosan polysulfate (ELMIRON) 100 MG capsule TAKE 1 CAPSULE (100 MG TOTAL) BY MOUTH 3 TIMES DAILY BEFORE MEALS.     polyethylene glycol (MIRALAX / GLYCOLAX) 17 g packet as needed.     pravastatin (PRAVACHOL) 20 MG tablet TAKE ONE TABLET BY MOUTH ONCE DAILY 15 tablet 0   pravastatin (PRAVACHOL) 20 MG tablet Take 1 tablet by mouth daily. (Patient not taking: Reported on 11/06/2022)     sitaGLIPtin (JANUVIA) 25 MG tablet Take 25 mg by mouth daily.     sucralfate (CARAFATE) 1 g tablet 1 tab(s) crush and mix in water 4 times a day (between meals and at bedtime) for 90 days     SYNTHROID 75 MCG tablet TAKE ONE TABLET BY MOUTH ONCE DAILY 90 tablet 1   torsemide (DEMADEX) 20 MG tablet Take 1 tablet by mouth daily.     triamcinolone (KENALOG) 0.025 % ointment Apply 1 application  topically 2 (two) times daily. (Patient not taking: Reported on 11/06/2022)     Vitamin D, Ergocalciferol, (DRISDOL) 1.25 MG (50000 UNIT) CAPS capsule TAKE 1 CAPSULE (50,000 UNITS TOTAL)  BY MOUTH EVERY 7 (SEVEN) DAYS. 20 capsule 0   No current facility-administered medications for this visit.     REVIEW OF SYSTEMS:   Constitutional: Denies fevers, chills or night sweats Eyes: Denies blurriness of vision Ears, nose, mouth, throat, and face: Denies mucositis or sore throat Respiratory: Denies cough, dyspnea or wheezes Cardiovascular: Denies palpitation, chest discomfort or lower extremity swelling Gastrointestinal:  Denies nausea, heartburn or change in bowel habits Skin: Denies abnormal skin rashes Lymphatics: Denies new lymphadenopathy or easy bruising Neurological:Denies numbness, tingling or new weaknesses Behavioral/Psych: Mood is stable, no new changes  All other systems were reviewed with the patient and are negative.  PHYSICAL EXAMINATION:  ECOG PERFORMANCE STATUS: 0 - Asymptomatic  Vitals:   11/28/22 1212  BP: (!) 122/48  Pulse: 67  Resp: 17  Temp: 97.7 F (36.5 C)  SpO2: 100%   Filed Weights   11/28/22 1212  Weight: 131 lb 3.2 oz (59.5 kg)    GENERAL:alert, no distress and comfortable NEURO: alert & oriented x 3 with fluent speech, no focal motor/sensory deficits  LABORATORY DATA:  I have reviewed the data as listed     Component Value Date/Time   NA 139 10/09/2022 1829   NA 140 01/18/2022 1150   NA 138 12/12/2016 1150   K 4.3 10/09/2022 1829   K 4.6 12/12/2016 1150   CL 104 10/09/2022 1829   CL 103 07/27/2012 1259   CO2 27 10/09/2022 1829   CO2 31 (H) 12/12/2016 1150   GLUCOSE 173 (H) 10/09/2022 1829   GLUCOSE 149 (H) 12/12/2016 1150   GLUCOSE 118 (H) 07/27/2012 1259   BUN 26 (H) 10/09/2022 1829   BUN 28 (H) 01/18/2022 1150   BUN 23.4 12/12/2016 1150   CREATININE 1.75 (H) 10/09/2022 1829   CREATININE 2.31 (H) 07/23/2022 1413   CREATININE 1.7 (H) 12/12/2016 1150   CALCIUM 8.3 (L) 10/09/2022 1829   CALCIUM 11.4 (H) 12/12/2016 1150   PROT 6.1 07/23/2022 1413   PROT 6.5 12/12/2016 1150   ALBUMIN 4.0 04/29/2019 1451   ALBUMIN 3.9  12/12/2016 1150   AST 14 07/23/2022 1413   AST 19 12/12/2016 1150   ALT 6 07/23/2022 1413   ALT 10 12/12/2016 1150   ALKPHOS 68 04/29/2019 1451   ALKPHOS 65 12/12/2016 1150   BILITOT 0.4 07/23/2022 1413   BILITOT 0.36 12/12/2016 1150   GFRNONAA 29 (L) 10/09/2022 1829   GFRNONAA 24 (L) 08/14/2021 1043   GFRNONAA 21 (L) 09/20/2019 1506   GFRAA 25 (L) 09/20/2019 1506    No results found for: "SPEP", "UPEP"  Lab Results  Component Value Date   WBC 6.1 11/28/2022   NEUTROABS 3.3 11/28/2022   HGB 9.4 (L) 11/28/2022   HCT 29.0 (L) 11/28/2022   MCV 85.5 11/28/2022   PLT 214 11/28/2022      Chemistry      Component Value Date/Time   NA 139 10/09/2022 1829   NA 140 01/18/2022 1150   NA 138 12/12/2016 1150   K 4.3 10/09/2022 1829   K 4.6 12/12/2016 1150   CL 104 10/09/2022 1829   CL 103 07/27/2012 1259   CO2 27 10/09/2022 1829   CO2 31 (H) 12/12/2016 1150   BUN 26 (H) 10/09/2022 1829   BUN 28 (H) 01/18/2022 1150   BUN 23.4 12/12/2016 1150   CREATININE 1.75 (H) 10/09/2022 1829   CREATININE 2.31 (H) 07/23/2022 1413   CREATININE 1.7 (H) 12/12/2016 1150      Component Value Date/Time   CALCIUM 8.3 (L) 10/09/2022 1829   CALCIUM 11.4 (H) 12/12/2016 1150   ALKPHOS 68 04/29/2019 1451   ALKPHOS 65 12/12/2016 1150   AST 14 07/23/2022 1413   AST 19 12/12/2016 1150   ALT 6 07/23/2022 1413   ALT 10 12/12/2016 1150   BILITOT 0.4 07/23/2022 1413   BILITOT 0.36 12/12/2016 1150

## 2022-11-28 NOTE — Assessment & Plan Note (Signed)
She has intermittent B12 deficiency She will receive B12 injection today along with darbepoetin injection 

## 2022-11-28 NOTE — Assessment & Plan Note (Signed)
She missed her recent injection in August I plan to see her back in 2 months for further follow-up We will continue ESA injection

## 2022-11-28 NOTE — Telephone Encounter (Signed)
Called and spoke to patient's daughter she voiced understanding

## 2022-12-01 DIAGNOSIS — E1122 Type 2 diabetes mellitus with diabetic chronic kidney disease: Secondary | ICD-10-CM | POA: Diagnosis not present

## 2022-12-09 ENCOUNTER — Telehealth: Payer: Self-pay | Admitting: *Deleted

## 2022-12-09 NOTE — Telephone Encounter (Signed)
Patient's daughter [name], contacted the office and states she would like to know with her mother's elevated uric acid. She would like to know if her mother can eat an unlimited amount of blueberries or if she should limit it to a moderate or small amount. Please advise.

## 2022-12-09 NOTE — Telephone Encounter (Signed)
[name] advised, Blueberries should not increase uric acid levels.  Should be okay for her to consume blueberries.

## 2022-12-09 NOTE — Telephone Encounter (Signed)
Blueberries should not increase uric acid levels.  Should be okay for her to consume blueberries.

## 2022-12-24 DIAGNOSIS — N184 Chronic kidney disease, stage 4 (severe): Secondary | ICD-10-CM | POA: Diagnosis not present

## 2022-12-26 ENCOUNTER — Ambulatory Visit
Admission: RE | Admit: 2022-12-26 | Discharge: 2022-12-26 | Disposition: A | Discharge status: Home / Self Care | Payer: Medicare Other | Source: Ambulatory Visit | Attending: Gastroenterology | Admitting: Gastroenterology

## 2022-12-26 DIAGNOSIS — Z1231 Encounter for screening mammogram for malignant neoplasm of breast: Secondary | ICD-10-CM | POA: Diagnosis not present

## 2022-12-30 ENCOUNTER — Encounter: Payer: Self-pay | Admitting: Cardiology

## 2022-12-30 ENCOUNTER — Ambulatory Visit: Payer: Medicare Other | Attending: Cardiology | Admitting: Cardiology

## 2022-12-30 VITALS — BP 140/64 | HR 56 | Resp 16 | Ht <= 58 in | Wt 132.8 lb

## 2022-12-30 DIAGNOSIS — R600 Localized edema: Secondary | ICD-10-CM

## 2022-12-30 DIAGNOSIS — R072 Precordial pain: Secondary | ICD-10-CM

## 2022-12-30 DIAGNOSIS — E1122 Type 2 diabetes mellitus with diabetic chronic kidney disease: Secondary | ICD-10-CM | POA: Diagnosis not present

## 2022-12-30 DIAGNOSIS — Z794 Long term (current) use of insulin: Secondary | ICD-10-CM

## 2022-12-30 DIAGNOSIS — N184 Chronic kidney disease, stage 4 (severe): Secondary | ICD-10-CM

## 2022-12-30 NOTE — Patient Instructions (Signed)
Medication Instructions:  No changes *If you need a refill on your cardiac medications before your next appointment, please call your pharmacy*   Lab Work: No labs ordered If you have labs (blood work) drawn today and your tests are completely normal, you will receive your results only by: MyChart Message (if you have MyChart) OR A paper copy in the mail If you have any lab test that is abnormal or we need to change your treatment, we will call you to review the results.   Testing/Procedures: None    Follow-Up: At [facility], you and your health needs are our priority.  As part of our continuing mission to provide you with exceptional heart care, we have created designated Provider Care Teams.  These Care Teams include your primary Cardiologist (physician) and Advanced Practice Providers (APPs -  Physician Assistants and Nurse Practitioners) who all work together to provide you with the care you need, when you need it.  We recommend signing up for the patient portal called "MyChart".  Sign up information is provided on this After Visit Summary.  MyChart is used to connect with patients for Virtual Visits (Telemedicine).  Patients are able to view lab/test results, encounter notes, upcoming appointments, etc.  Non-urgent messages can be sent to your provider as well.   To learn more about what you can do with MyChart, go to [email or web address].    Your next appointment:   1 month(s)  Provider:   Truett Mainland

## 2022-12-30 NOTE — Progress Notes (Signed)
Cardiology Office Note:  .   Date:  12/30/2022  ID:  Abigail Hoover, DOB 1945-05-20, MRN [id number] PCP: Abigail Savoy, MD  [facility] Providers Cardiologist:  Abigail Mainland, MD PCP: Abigail Savoy, MD  Chief Complaint  Patient presents with   Chest Pain      History of Present Illness: .    Abigail Hoover is a 77 y.o. female with  hypertension, type 2 diabetes mellitus, hypothyroidism, CKD stage 4, anemia, leg edema   Patient is here today with her niece Abigail Hoover.  Patient continues to have symptoms of chest pain, retrosternal, lasting for several minutes, both at rest and exertion.  She is already on antianginal medications, stress test has been normal in the past.  Reviewed recent lab results that showed a GFR of 24.  Vitals:   12/30/22 0908  BP: (!) 140/64  Pulse: (!) 56  Resp: 16  SpO2: 99%     ROS:  Review of Systems  Cardiovascular:  Positive for chest pain.     Studies Reviewed: .       11/19/2022: Glucose 177, BUN/Cr 30/2.0. EGFR 24. K 4.3. Hb 9.4 HbA1C 6.2% Chol 167, TG 111, HDL 57, LDL 90 TSH 10.7 high  Lexiscan Nuclear stress test 02/12/2022: Myocardial perfusion is normal. Overall LV systolic function is normal without regional wall motion abnormalities. Stress LV EF: 73%.  Nondiagnostic ECG stress. The heart rate response was consistent with Regadenoson.  No previous exam available for comparison. Low risk study.    Echocardiogram 01/21/2022:  Normal LV systolic function with visual EF 55-60%. Left ventricle cavity  is normal in size. Normal left ventricular wall thickness. Normal global  wall motion. Doppler evidence of grade I (impaired) diastolic dysfunction,  normal LAP. Calculated EF 56%.  Native mitral valve.  Mild (Grade I) mitral regurgitation.  Structurally normal tricuspid valve with no regurgitation. No evidence of  pulmonary hypertension.  No prior available for comparison.     Physical Exam:   Physical  Exam Vitals and nursing note reviewed.  Constitutional:      General: She is not in acute distress. Neck:     Vascular: No JVD.  Cardiovascular:     Rate and Rhythm: Normal rate and regular rhythm.     Heart sounds: Normal heart sounds. No murmur heard. Pulmonary:     Effort: Pulmonary effort is normal.     Breath sounds: Normal breath sounds. No wheezing or rales.  Musculoskeletal:     Right lower leg: Edema (Trace) present.     Left lower leg: Edema (Trace) present.      VISIT DIAGNOSES:   ICD-10-CM   1. Leg edema  R60.0     2. Retrosternal chest pain  R07.2        ASSESSMENT AND PLAN: .    Abigail Hoover is a 77 y.o. female with hypertension, type 2 diabetes mellitus, hypothyroidism, CKD stage 4, anemia, leg edema, now with possible angina symptoms   Chest pain: Retrosternal, occurs with both rest and exertion. Given her risk factors including uncontrolled type 2 diabetes mellitus, I cannot exclude angina as etiology, even though prior stress testing has been unremarkable in 12/2021. We discussed empiric use of sublingual nitroglycerin, versus cardiac PET stress test, versus heart catheterization.  We have mutually agreed that given her age, GFR of 23, would like to avoid stress testing as much as possible.  However, it remains unclear if this truly is anginal.  Patient would like to  discuss with her nephew [name] and decide further.  She would also like me to talk to her friend and GI physician Dr. Loreta Ave. Continue Aspirin, stain, metoprolol, Imdur.  Leg edema: Improved  Type 2 diabetes mellitus: Continue follow-up with endocrinology.  F/u in 4 weeks  Signed, Elder Negus, MD

## 2022-12-31 DIAGNOSIS — E1122 Type 2 diabetes mellitus with diabetic chronic kidney disease: Secondary | ICD-10-CM | POA: Diagnosis not present

## 2023-01-01 DIAGNOSIS — N184 Chronic kidney disease, stage 4 (severe): Secondary | ICD-10-CM | POA: Diagnosis not present

## 2023-01-01 DIAGNOSIS — N2581 Secondary hyperparathyroidism of renal origin: Secondary | ICD-10-CM | POA: Diagnosis not present

## 2023-01-01 DIAGNOSIS — D631 Anemia in chronic kidney disease: Secondary | ICD-10-CM | POA: Diagnosis not present

## 2023-01-01 DIAGNOSIS — I129 Hypertensive chronic kidney disease with stage 1 through stage 4 chronic kidney disease, or unspecified chronic kidney disease: Secondary | ICD-10-CM | POA: Diagnosis not present

## 2023-01-08 DIAGNOSIS — I8310 Varicose veins of unspecified lower extremity with inflammation: Secondary | ICD-10-CM | POA: Diagnosis not present

## 2023-01-08 DIAGNOSIS — R600 Localized edema: Secondary | ICD-10-CM | POA: Diagnosis not present

## 2023-01-08 DIAGNOSIS — N184 Chronic kidney disease, stage 4 (severe): Secondary | ICD-10-CM | POA: Diagnosis not present

## 2023-01-09 DIAGNOSIS — R300 Dysuria: Secondary | ICD-10-CM | POA: Diagnosis not present

## 2023-01-14 DIAGNOSIS — N3010 Interstitial cystitis (chronic) without hematuria: Secondary | ICD-10-CM | POA: Diagnosis not present

## 2023-01-14 DIAGNOSIS — R300 Dysuria: Secondary | ICD-10-CM | POA: Diagnosis not present

## 2023-01-16 ENCOUNTER — Telehealth: Payer: Self-pay | Admitting: Hematology and Oncology

## 2023-01-16 NOTE — Telephone Encounter (Signed)
Patient stated they were unable to come to original appointment time on 11/7 and needed something further out; patient is aware of rescheduled appointment times/dates per patient preference

## 2023-01-22 ENCOUNTER — Other Ambulatory Visit (HOSPITAL_COMMUNITY): Payer: Self-pay

## 2023-01-28 DIAGNOSIS — R600 Localized edema: Secondary | ICD-10-CM | POA: Diagnosis not present

## 2023-01-28 DIAGNOSIS — I8310 Varicose veins of unspecified lower extremity with inflammation: Secondary | ICD-10-CM | POA: Diagnosis not present

## 2023-01-28 DIAGNOSIS — I700 Atherosclerosis of aorta: Secondary | ICD-10-CM | POA: Diagnosis not present

## 2023-01-30 ENCOUNTER — Inpatient Hospital Stay: Payer: Medicare Other | Admitting: Hematology and Oncology

## 2023-01-30 ENCOUNTER — Inpatient Hospital Stay: Payer: Medicare Other

## 2023-01-30 DIAGNOSIS — R29898 Other symptoms and signs involving the musculoskeletal system: Secondary | ICD-10-CM | POA: Diagnosis not present

## 2023-01-30 DIAGNOSIS — M65332 Trigger finger, left middle finger: Secondary | ICD-10-CM | POA: Diagnosis not present

## 2023-01-30 DIAGNOSIS — Z4789 Encounter for other orthopedic aftercare: Secondary | ICD-10-CM | POA: Diagnosis not present

## 2023-01-31 DIAGNOSIS — E1122 Type 2 diabetes mellitus with diabetic chronic kidney disease: Secondary | ICD-10-CM | POA: Diagnosis not present

## 2023-02-04 ENCOUNTER — Ambulatory Visit: Payer: Medicare Other | Attending: Cardiology | Admitting: Cardiology

## 2023-02-04 ENCOUNTER — Encounter: Payer: Self-pay | Admitting: Cardiology

## 2023-02-04 VITALS — BP 118/58 | HR 60 | Resp 15 | Ht <= 58 in | Wt 132.0 lb

## 2023-02-04 DIAGNOSIS — I10 Essential (primary) hypertension: Secondary | ICD-10-CM

## 2023-02-04 DIAGNOSIS — I2089 Other forms of angina pectoris: Secondary | ICD-10-CM

## 2023-02-04 NOTE — Progress Notes (Signed)
Cardiology Office Note:  .   Date:  02/04/2023  ID:  Abigail Hoover, DOB 1945-11-26, MRN [id number] PCP: Pollyann Savoy, MD  [facility] Providers Cardiologist:  Truett Mainland, MD PCP: Pollyann Savoy, MD  Chief Complaint  Patient presents with   Precordial pain   Follow-up      History of Present Illness: .    Abigail Hoover is a 77 y.o. female with  hypertension, type 2 diabetes mellitus, hypothyroidism, CKD stage 4, anemia, leg edema   Patient is here today with her nephew Abigail Hoover. Patient continues to have symptoms of chest pain, retrosternal, lasting for several minutes, both at rest and exertion.  Episodes occur multiple times a week.  She is already on antianginal medications, stress test has been normal in the past.  Reviewed recent lab results that showed a GFR of 24.  Vitals:   02/04/23 1155  BP: (!) 118/58  Pulse: 60  Resp: 15  SpO2: 98%     ROS:  Review of Systems  Cardiovascular:  Positive for chest pain.     Studies Reviewed: .       11/19/2022: Glucose 177, BUN/Cr 30/2.0. EGFR 24. K 4.3. Hb 9.4 HbA1C 6.2% Chol 167, TG 111, HDL 57, LDL 90 TSH 10.7 high  Lexiscan Nuclear stress test 02/12/2022: Myocardial perfusion is normal. Overall LV systolic function is normal without regional wall motion abnormalities. Stress LV EF: 73%.  Nondiagnostic ECG stress. The heart rate response was consistent with Regadenoson.  No previous exam available for comparison. Low risk study.    Echocardiogram 01/21/2022:  Normal LV systolic function with visual EF 55-60%. Left ventricle cavity  is normal in size. Normal left ventricular wall thickness. Normal global  wall motion. Doppler evidence of grade I (impaired) diastolic dysfunction,  normal LAP. Calculated EF 56%.  Native mitral valve.  Mild (Grade I) mitral regurgitation.  Structurally normal tricuspid valve with no regurgitation. No evidence of  pulmonary hypertension.  No prior available  for comparison.     Physical Exam:   Physical Exam Vitals and nursing note reviewed.  Constitutional:      General: She is not in acute distress. Neck:     Vascular: No JVD.  Cardiovascular:     Rate and Rhythm: Normal rate and regular rhythm.     Heart sounds: Normal heart sounds. No murmur heard. Pulmonary:     Effort: Pulmonary effort is normal.     Breath sounds: Normal breath sounds. No wheezing or rales.  Musculoskeletal:     Right lower leg: Edema (Trace) present.     Left lower leg: Edema (Trace) present.     VISIT DIAGNOSES: No diagnosis found.    ASSESSMENT AND PLAN: .    Abigail Hoover is a 78 y.o. female with hypertension, type 2 diabetes mellitus, hypothyroidism, CKD stage 4, anemia, leg edema, now with possible angina symptoms   Chest pain: Retrosternal, occurs with both rest and exertion. Given her risk factors including uncontrolled type 2 diabetes mellitus, I cannot exclude angina as etiology, even though prior stress testing has been unremarkable in 12/2021. Given recurrent multiple episodes of chest pain, I will order PET/CT stress test for further evaluation. We will need to be careful about further recommendations symptoms of invasive management given her GFR in low 20s. Continue Aspirin, stain, metoprolol, Imdur.  Hypertension: Controlled  Leg edema: Improved  Type 2 diabetes mellitus: Continue follow-up with endocrinology.  F/u in 3 months  Signed, [employer],  MD

## 2023-02-04 NOTE — Patient Instructions (Addendum)
Medication Instructions:   Your physician recommends that you continue on your current medications as directed. Please refer to the Current Medication list given to you today.  *If you need a refill on your cardiac medications before your next appointment, please call your pharmacy*    Testing/Procedures:  How to Prepare for Your Cardiac PET/CT Stress Test:  1. Please do not take these medications before your test:   Medications that may interfere with the cardiac pharmacological stress agent (ex. nitrates - including erectile dysfunction medications, isosorbide mononitrate- [please start to hold this medication the day before the test], tamulosin or beta-blockers) the day of the exam. (Erectile dysfunction medication should be held for at least 72 hrs prior to test)  HOLD YOUR METOPROLOL AND ISOSORBIDE MONONITRATE DAY OF THIS EXAM   Your remaining medications may be taken with water.  2. Nothing to eat or drink, except water, 3 hours prior to arrival time.   NO caffeine/decaffeinated products, or chocolate 12 hours prior to arrival.  3. NO perfume, cologne or lotion on chest or abdomen area.          - FEMALES - Please avoid wearing dresses to this appointment.  4. Total time is 1 to 2 hours; you may want to bring reading material for the waiting time.  5. Please report to Radiology at the [facility] Main Entrance 30 minutes early for your test.  [address] [place], [place] [address]    Diabetic Preparation:  Hold oral medications. You may take NPH and Lantus insulin. Do not take Humalog or Humulin R (Regular Insulin) the day of your test. Check blood sugars prior to leaving the house. If able to eat breakfast prior to 3 hour fasting, you may take all medications, including your insulin, Do not worry if you miss your breakfast dose of insulin - start at your next meal. Patients who wear a continuous glucose monitor MUST remove the device prior to  scanning.    In preparation for your appointment, medication and supplies will be purchased.  Appointment availability is limited, so if you need to cancel or reschedule, please call the Radiology Department at [phone] [facility]) OR [phone] [facility])  24 hours in advance to avoid a cancellation fee of $100.00  What to Expect After you Arrive:  Once you arrive and check in for your appointment, you will be taken to a preparation room within the Radiology Department.  A technologist or Nurse will obtain your medical history, verify that you are correctly prepped for the exam, and explain the procedure.  Afterwards,  an IV will be started in your arm and electrodes will be placed on your skin for EKG monitoring during the stress portion of the exam. Then you will be escorted to the PET/CT scanner.  There, staff will get you positioned on the scanner and obtain a blood pressure and EKG.  During the exam, you will continue to be connected to the EKG and blood pressure machines.  A small, safe amount of a radioactive tracer will be injected in your IV to obtain a series of pictures of your heart along with an injection of a stress agent.    After your Exam:  It is recommended that you eat a meal and drink a caffeinated beverage to counter act any effects of the stress agent.  Drink plenty of fluids for the remainder of the day and urinate frequently for the first couple of hours after the exam.  Your  doctor will inform you of your test results within 7-10 business days.  For more information and frequently asked questions, please visit our website : [email or web address]  For questions about your test or how to prepare for your test, please call: Cardiac Imaging Nurse Navigators Office: [phone]    Follow-Up:   3 MONTHS WITH DR. PATWARDHAN

## 2023-02-06 ENCOUNTER — Ambulatory Visit: Payer: Medicare Other | Admitting: Rheumatology

## 2023-02-11 ENCOUNTER — Telehealth: Payer: Self-pay | Admitting: Pharmacist

## 2023-02-11 ENCOUNTER — Other Ambulatory Visit: Payer: Self-pay

## 2023-02-11 DIAGNOSIS — D631 Anemia in chronic kidney disease: Secondary | ICD-10-CM

## 2023-02-11 DIAGNOSIS — M8000XS Age-related osteoporosis with current pathological fracture, unspecified site, sequela: Secondary | ICD-10-CM

## 2023-02-11 DIAGNOSIS — E538 Deficiency of other specified B group vitamins: Secondary | ICD-10-CM

## 2023-02-11 NOTE — Telephone Encounter (Signed)
Patient was due for Prolia on 02/02/2023  She has appt with Dr. Bertis Ruddy on 02/13/23. Staff message sent to see if labs can be drawn on 02/13/23  Chesley Mires, PharmD, MPH, BCPS, CPP Clinical Pharmacist (Rheumatology and Pulmonology)

## 2023-02-13 ENCOUNTER — Inpatient Hospital Stay: Payer: Medicare Other | Admitting: Hematology and Oncology

## 2023-02-13 ENCOUNTER — Inpatient Hospital Stay: Payer: Medicare Other

## 2023-02-13 ENCOUNTER — Inpatient Hospital Stay: Payer: Medicare Other | Attending: Hematology and Oncology

## 2023-02-13 VITALS — BP 140/50 | HR 62 | Resp 18 | Ht <= 58 in | Wt 134.2 lb

## 2023-02-13 DIAGNOSIS — E538 Deficiency of other specified B group vitamins: Secondary | ICD-10-CM | POA: Diagnosis not present

## 2023-02-13 DIAGNOSIS — N183 Chronic kidney disease, stage 3 unspecified: Secondary | ICD-10-CM

## 2023-02-13 DIAGNOSIS — M85851 Other specified disorders of bone density and structure, right thigh: Secondary | ICD-10-CM | POA: Insufficient documentation

## 2023-02-13 DIAGNOSIS — Z9189 Other specified personal risk factors, not elsewhere classified: Secondary | ICD-10-CM | POA: Diagnosis not present

## 2023-02-13 DIAGNOSIS — N184 Chronic kidney disease, stage 4 (severe): Secondary | ICD-10-CM | POA: Diagnosis not present

## 2023-02-13 DIAGNOSIS — D631 Anemia in chronic kidney disease: Secondary | ICD-10-CM

## 2023-02-13 LAB — CMP (CANCER CENTER ONLY)
ALT: 12 U/L (ref 0–44)
AST: 18 U/L (ref 15–41)
Albumin: 3.9 g/dL (ref 3.5–5.0)
Alkaline Phosphatase: 59 U/L (ref 38–126)
Anion gap: 6 (ref 5–15)
BUN: 27 mg/dL — ABNORMAL HIGH (ref 8–23)
CO2: 30 mmol/L (ref 22–32)
Calcium: 9.4 mg/dL (ref 8.9–10.3)
Chloride: 94 mmol/L — ABNORMAL LOW (ref 98–111)
Creatinine: 2.07 mg/dL — ABNORMAL HIGH (ref 0.44–1.00)
GFR, Estimated: 24 mL/min — ABNORMAL LOW (ref >60)
Glucose, Bld: 205 mg/dL — ABNORMAL HIGH (ref 70–99)
Potassium: 4.5 mmol/L (ref 3.5–5.1)
Sodium: 130 mmol/L — ABNORMAL LOW (ref 135–145)
Total Bilirubin: 0.4 mg/dL (ref <1.2)
Total Protein: 6 g/dL — ABNORMAL LOW (ref 6.5–8.1)

## 2023-02-13 LAB — CBC WITH DIFFERENTIAL (CANCER CENTER ONLY)
Abs Immature Granulocytes: 0.02 10*3/uL (ref 0.00–0.07)
Basophils Absolute: 0 10*3/uL (ref 0.0–0.1)
Basophils Relative: 0 %
Eosinophils Absolute: 0.5 10*3/uL (ref 0.0–0.5)
Eosinophils Relative: 9 %
HCT: 29.3 % — ABNORMAL LOW (ref 36.0–46.0)
Hemoglobin: 9.6 g/dL — ABNORMAL LOW (ref 12.0–15.0)
Immature Granulocytes: 0 %
Lymphocytes Relative: 17 %
Lymphs Abs: 1.1 10*3/uL (ref 0.7–4.0)
MCH: 29.2 pg (ref 26.0–34.0)
MCHC: 32.8 g/dL (ref 30.0–36.0)
MCV: 89.1 fL (ref 80.0–100.0)
Monocytes Absolute: 0.5 10*3/uL (ref 0.1–1.0)
Monocytes Relative: 8 %
Neutro Abs: 4 10*3/uL (ref 1.7–7.7)
Neutrophils Relative %: 66 %
Platelet Count: 156 10*3/uL (ref 150–400)
RBC: 3.29 MIL/uL — ABNORMAL LOW (ref 3.87–5.11)
RDW: 16.1 % — ABNORMAL HIGH (ref 11.5–15.5)
WBC Count: 6.1 10*3/uL (ref 4.0–10.5)
nRBC: 0 % (ref 0.0–0.2)

## 2023-02-13 LAB — VITAMIN D 25 HYDROXY (VIT D DEFICIENCY, FRACTURES): Vit D, 25-Hydroxy: 76.06 ng/mL (ref 30–100)

## 2023-02-13 MED ORDER — DARBEPOETIN ALFA 200 MCG/0.4ML IJ SOSY
200.0000 ug | PREFILLED_SYRINGE | Freq: Once | INTRAMUSCULAR | Status: AC
Start: 2023-02-13 — End: 2023-02-13
  Administered 2023-02-13: 200 ug via SUBCUTANEOUS
  Filled 2023-02-13: qty 0.4

## 2023-02-13 MED ORDER — CYANOCOBALAMIN 1000 MCG/ML IJ SOLN
1000.0000 ug | Freq: Once | INTRAMUSCULAR | Status: AC
  Administered 2023-02-13: 1000 ug via INTRAMUSCULAR
  Filled 2023-02-13: qty 1

## 2023-02-13 NOTE — Assessment & Plan Note (Signed)
She has intermittent B12 deficiency She will receive B12 injection today along with darbepoetin injection 

## 2023-02-13 NOTE — Assessment & Plan Note (Signed)
I plan to see her back in 2 months for further follow-up We will continue ESA injection

## 2023-02-13 NOTE — Assessment & Plan Note (Signed)
She has family history of breast cancer and is concerned Recent mammogram is normal Her breast exam is normal and she is reassured

## 2023-02-13 NOTE — Progress Notes (Signed)
[facility] OFFICE PROGRESS NOTE  Patient Care Team: Pollyann Savoy, MD as PCP - General (Rheumatology) Reather Littler, MD (Inactive) as Attending Physician (Endocrinology) Avel Peace, MD as Attending Physician (General Surgery) Shirlean Kelly, MD as Attending Physician (Neurosurgery) Artis Delay, MD as Consulting Physician (Hematology and Oncology) Izell [place], MD as Referring Physician (Endocrinology)  ASSESSMENT & PLAN:  Anemia in chronic kidney disease I plan to see her back in 2 months for further follow-up We will continue ESA injection  Vitamin B 12 deficiency She has intermittent B12 deficiency She will receive B12 injection today along with darbepoetin injection  At high risk for breast cancer She has family history of breast cancer and is concerned Recent mammogram is normal Her breast exam is normal and she is reassured  Orders Placed This Encounter  Procedures   Vitamin B12    Standing Status:   Future    Standing Expiration Date:   02/13/2024    All questions were answered. The patient knows to call the clinic with any problems, questions or concerns. The total time spent in the appointment was 20 minutes encounter with patients including review of chart and various tests results, discussions about plan of care and coordination of care plan   Artis Delay, MD 02/13/2023 3:47 PM  INTERVAL HISTORY: Please see below for problem oriented charting. she returns for surveillance follow-up and for supportive care with B12 and iron as She is doing well with no complaints She had recent normal mammogram but requests breast exam today  REVIEW OF SYSTEMS:   Constitutional: Denies fevers, chills or abnormal weight loss Eyes: Denies blurriness of vision Ears, nose, mouth, throat, and face: Denies mucositis or sore throat Respiratory: Denies cough, dyspnea or wheezes Cardiovascular: Denies palpitation, chest discomfort or lower extremity  swelling Gastrointestinal:  Denies nausea, heartburn or change in bowel habits Skin: Denies abnormal skin rashes Lymphatics: Denies new lymphadenopathy or easy bruising Neurological:Denies numbness, tingling or new weaknesses Behavioral/Psych: Mood is stable, no new changes  All other systems were reviewed with the patient and are negative.  I have reviewed the past medical history, past surgical history, social history and family history with the patient and they are unchanged from previous note.  ALLERGIES:  is allergic to atorvastatin and ezetimibe.  MEDICATIONS:  Current Outpatient Medications  Medication Sig Dispense Refill   allopurinol (ZYLOPRIM) 100 MG tablet Take 1 tablet by mouth daily.     aspirin EC 81 MG tablet Take 81 mg by mouth daily. Take 1 tablet by mouth once daily.     colchicine 0.6 MG tablet Take 0.5 tablets (0.3 mg total) by mouth daily. 15 tablet 2   Continuous Blood Gluc Sensor (DEXCOM G7 SENSOR) MISC Inject 1 sensor to the skin every 10 days for continuous glucose monitoring.     Cyanocobalamin (VITAMIN B-12 IJ) Inject as directed every 3 (three) months.     Darbepoetin Alfa (ARANESP) 60 MCG/0.3ML SOSY injection Inject 60 mcg into the skin.     denosumab (PROLIA) 60 MG/ML SOSY injection Inject 60 mg into the skin every 6 (six) months. Courier to rheum: [address], [address], [place] [address]. Appt 08/06/22 1 mL 0   docusate sodium (COLACE) 100 MG capsule Take 400 mg by mouth daily.     famotidine (PEPCID) 40 MG tablet Take 40 mg by mouth 2 (two) times daily.     gabapentin (NEURONTIN) 300 MG capsule Take 300 mg by mouth 3 (three) times daily.  glucose blood (ONETOUCH VERIO) test strip Use Onetouch verio test strips to check blood sugar three times daily. (90 day RX) 300 each 2   hydrocortisone 2.5 % ointment      Insulin Glargine (BASAGLAR KWIKPEN) 100 UNIT/ML Inject 2 Units into the skin daily. (Patient not taking: Reported on 02/04/2023)     Insulin  Pen Needle (PEN NEEDLES 3/16") 31G X 5 MM MISC Use 1 pen needle to inject Forteo daily. 100 each 2   Insulin Pen Needle 31G X 5 MM MISC Use with Novolog 100 each 1   Insulin Syringe-Needle U-100 25G X 5/8" 1 ML MISC USE DAILY TO INJECT INSULIN 100 each 3   isosorbide mononitrate (IMDUR) 120 MG 24 hr tablet Take 1 tablet by mouth every morning.     Lancet Devices (ONE TOUCH DELICA LANCING DEV) MISC 1 each by Does not apply route daily. Use as instructed to check blood sugar once daily. 1 each 0   Lancets (ONETOUCH DELICA PLUS LANCET33G) MISC 4 (four) times daily.     metoprolol succinate (TOPROL-XL) 50 MG 24 hr tablet Take 1 tablet (50 mg total) by mouth daily. Take with or immediately following a meal. 30 tablet 3   mirabegron ER (MYRBETRIQ) 50 MG TB24 tablet Take by mouth.     nitroGLYCERIN (NITROSTAT) 0.4 MG SL tablet PLACE 1 TAB UNDER TONGUE FOR CHEST PAIN EVERY 5 MINUTES     MAXIMUM OF 3 DOSES     CALL 911 AFTER FIRST DOSE. 25 tablet 0   NOVOLOG FLEXPEN 100 UNIT/ML FlexPen INJECT 9-11 UNITS UNDER THE SKIN BEFORE the 3 meals (Patient taking differently: 6-7 Units. [id number] UNITS UNDER THE SKIN BEFORE the 3 meals) 30 mL 3   pantoprazole (PROTONIX) 40 MG tablet Take 40 mg by mouth every morning.     pentosan polysulfate (ELMIRON) 100 MG capsule TAKE 1 CAPSULE (100 MG TOTAL) BY MOUTH 3 TIMES DAILY BEFORE MEALS.     polyethylene glycol (MIRALAX / GLYCOLAX) 17 g packet as needed.     pravastatin (PRAVACHOL) 20 MG tablet TAKE ONE TABLET BY MOUTH ONCE DAILY 15 tablet 0   sitaGLIPtin (JANUVIA) 25 MG tablet Take 25 mg by mouth daily. (Patient not taking: Reported on 02/04/2023)     sucralfate (CARAFATE) 1 g tablet 1 tab(s) crush and mix in water 4 times a day (between meals and at bedtime) for 90 days     SYNTHROID 75 MCG tablet TAKE ONE TABLET BY MOUTH ONCE DAILY (Patient taking differently: Take 75 mcg by mouth. 6 days and 1/2 tablet on Sunday) 90 tablet 1   torsemide (DEMADEX) 20 MG tablet  Take 1 tablet by mouth daily.     triamcinolone (KENALOG) 0.025 % ointment Apply 1 application  topically 2 (two) times daily.     No current facility-administered medications for this visit.    SUMMARY OF ONCOLOGIC HISTORY This is a patient that has been followed here since 2011 for chronic anemia In 2011, she had a bone marrow aspirate and biopsy that came back normal. The patient has been receiving Aranesp injection for chronic anemia up until 2014. It was subsequently discontinued due stability of the anemia. Starting 05/27/2014, darbepoetin was resumed at 200 g every other month to keep hemoglobin greater than 10 g. Treatment was discontinued in October 2016 and resumed in September 2017 The patient is subsequently found to have severe vitamin B12 deficiency and is started on vitamin B12 injection on December 25, 2016 along with resumption  of darbepoetin injection every 5 to 6 months.  She is also seen due to family history of breast cancer She also required intravenous iron infusion intermittently for concurrent iron deficiency anemia  PHYSICAL EXAMINATION: ECOG PERFORMANCE STATUS: 0 - Asymptomatic  Vitals:   02/13/23 1140  BP: (!) 140/50  Pulse: 62  Resp: 18  SpO2: 100%   Filed Weights   02/13/23 1140  Weight: 134 lb 3.2 oz (60.9 kg)    GENERAL:alert, no distress and comfortable SKIN: skin color, texture, turgor are normal, no rashes or significant lesions EYES: normal, Conjunctiva are pink and non-injected, sclera clear OROPHARYNX:no exudate, no erythema and lips, buccal mucosa, and tongue normal  NECK: supple, thyroid normal size, non-tender, without nodularity LYMPH:  no palpable lymphadenopathy in the cervical, axillary or inguinal LUNGS: clear to auscultation and percussion with normal breathing effort HEART: regular rate & rhythm and no murmurs and no lower extremity edema ABDOMEN:abdomen soft, non-tender and normal bowel sounds Musculoskeletal:no cyanosis of digits  and no clubbing  NEURO: alert & oriented x 3 with fluent speech, no focal motor/sensory deficits Normal bilateral breast exam  LABORATORY DATA:  I have reviewed the data as listed    Component Value Date/Time   NA 130 (L) 02/13/2023 1119   NA 140 01/18/2022 1150   NA 138 12/12/2016 1150   K 4.5 02/13/2023 1119   K 4.6 12/12/2016 1150   CL 94 (L) 02/13/2023 1119   CL 103 07/27/2012 1259   CO2 30 02/13/2023 1119   CO2 31 (H) 12/12/2016 1150   GLUCOSE 205 (H) 02/13/2023 1119   GLUCOSE 149 (H) 12/12/2016 1150   GLUCOSE 118 (H) 07/27/2012 1259   BUN 27 (H) 02/13/2023 1119   BUN 28 (H) 01/18/2022 1150   BUN 23.4 12/12/2016 1150   CREATININE 2.07 (H) 02/13/2023 1119   CREATININE 2.31 (H) 07/23/2022 1413   CREATININE 1.7 (H) 12/12/2016 1150   CALCIUM 9.4 02/13/2023 1119   CALCIUM 11.4 (H) 12/12/2016 1150   PROT 6.0 (L) 02/13/2023 1119   PROT 6.5 12/12/2016 1150   ALBUMIN 3.9 02/13/2023 1119   ALBUMIN 3.9 12/12/2016 1150   AST 18 02/13/2023 1119   AST 19 12/12/2016 1150   ALT 12 02/13/2023 1119   ALT 10 12/12/2016 1150   ALKPHOS 59 02/13/2023 1119   ALKPHOS 65 12/12/2016 1150   BILITOT 0.4 02/13/2023 1119   BILITOT 0.36 12/12/2016 1150   GFRNONAA 24 (L) 02/13/2023 1119   GFRNONAA 21 (L) 09/20/2019 1506   GFRAA 25 (L) 09/20/2019 1506    No results found for: "SPEP", "UPEP"  Lab Results  Component Value Date   WBC 6.1 02/13/2023   NEUTROABS 4.0 02/13/2023   HGB 9.6 (L) 02/13/2023   HCT 29.3 (L) 02/13/2023   MCV 89.1 02/13/2023   PLT 156 02/13/2023      Chemistry      Component Value Date/Time   NA 130 (L) 02/13/2023 1119   NA 140 01/18/2022 1150   NA 138 12/12/2016 1150   K 4.5 02/13/2023 1119   K 4.6 12/12/2016 1150   CL 94 (L) 02/13/2023 1119   CL 103 07/27/2012 1259   CO2 30 02/13/2023 1119   CO2 31 (H) 12/12/2016 1150   BUN 27 (H) 02/13/2023 1119   BUN 28 (H) 01/18/2022 1150   BUN 23.4 12/12/2016 1150   CREATININE 2.07 (H) 02/13/2023 1119   CREATININE  2.31 (H) 07/23/2022 1413   CREATININE 1.7 (H) 12/12/2016 1150  Component Value Date/Time   CALCIUM 9.4 02/13/2023 1119   CALCIUM 11.4 (H) 12/12/2016 1150   ALKPHOS 59 02/13/2023 1119   ALKPHOS 65 12/12/2016 1150   AST 18 02/13/2023 1119   AST 19 12/12/2016 1150   ALT 12 02/13/2023 1119   ALT 10 12/12/2016 1150   BILITOT 0.4 02/13/2023 1119   BILITOT 0.36 12/12/2016 1150

## 2023-02-14 ENCOUNTER — Encounter: Payer: Self-pay | Admitting: Hematology and Oncology

## 2023-02-17 DIAGNOSIS — I8310 Varicose veins of unspecified lower extremity with inflammation: Secondary | ICD-10-CM | POA: Diagnosis not present

## 2023-02-17 DIAGNOSIS — R600 Localized edema: Secondary | ICD-10-CM | POA: Diagnosis not present

## 2023-02-17 DIAGNOSIS — R300 Dysuria: Secondary | ICD-10-CM | POA: Diagnosis not present

## 2023-02-19 ENCOUNTER — Telehealth: Payer: Self-pay | Admitting: *Deleted

## 2023-02-19 NOTE — Telephone Encounter (Signed)
Notes copied below from [facility] Cardiac PET Scan Scheduler:  11/19 f/u with daughter will call back next week after speaking with patient. [facility] 02/05/23 pt daughter will call back after discussing with pt. [facility]  02/04/23 [employer] [id number] - AUTH# [id number] EXP - 03/05/23.Marland KitchenMarland KitchenAA

## 2023-02-19 NOTE — Telephone Encounter (Signed)
-----   Message from Nurse Corky Crafts sent at 02/04/2023 12:17 PM EST ----- Regarding: CARDIAC PET SCAN PER DR. PATWARDHAN -NM PET CT Cardiac perfusion multi ([id number]) ordered by Dr. Rosemary Holms -please pre-cert and schedule Thanks, Lajoyce Corners

## 2023-02-25 ENCOUNTER — Other Ambulatory Visit (HOSPITAL_COMMUNITY): Payer: Self-pay

## 2023-02-25 ENCOUNTER — Other Ambulatory Visit: Payer: Self-pay

## 2023-02-25 ENCOUNTER — Encounter: Payer: Self-pay | Admitting: Hematology and Oncology

## 2023-02-25 MED ORDER — DENOSUMAB 60 MG/ML ~~LOC~~ SOSY
60.0000 mg | PREFILLED_SYRINGE | SUBCUTANEOUS | 0 refills | Status: DC
  Filled 2023-02-25: qty 1, 180d supply, fill #0

## 2023-02-25 NOTE — Progress Notes (Signed)
Specialty Pharmacy Refill Coordination Note  [name] is a 77 y.o. female assessed today regarding refills of clinic administered specialty medication(s) Denosumab   Clinic requested Courier to Provider Office   Delivery date: 02/26/23   Verified address: [address]   Medication will be filled on 02/25/23.

## 2023-02-25 NOTE — Telephone Encounter (Signed)
Vitamin D wnl, calcium wnl. Patient's creatinine has increased. Discussed with [name].  Patient scheduled for Prolia appt on 03/03/2023  Chesley Mires, PharmD, MPH, BCPS, CPP Clinical Pharmacist (Rheumatology and Pulmonology)

## 2023-02-26 ENCOUNTER — Encounter (HOSPITAL_BASED_OUTPATIENT_CLINIC_OR_DEPARTMENT_OTHER): Payer: Self-pay | Admitting: Emergency Medicine

## 2023-02-26 ENCOUNTER — Emergency Department (HOSPITAL_BASED_OUTPATIENT_CLINIC_OR_DEPARTMENT_OTHER): Payer: Medicare Other

## 2023-02-26 ENCOUNTER — Emergency Department (HOSPITAL_BASED_OUTPATIENT_CLINIC_OR_DEPARTMENT_OTHER)
Admission: EM | Admit: 2023-02-26 | Discharge: 2023-02-26 | Disposition: A | Discharge status: Home / Self Care | Payer: Medicare Other | Attending: Emergency Medicine | Admitting: Emergency Medicine

## 2023-02-26 ENCOUNTER — Other Ambulatory Visit: Payer: Self-pay

## 2023-02-26 DIAGNOSIS — Z79899 Other long term (current) drug therapy: Secondary | ICD-10-CM | POA: Insufficient documentation

## 2023-02-26 DIAGNOSIS — Z7984 Long term (current) use of oral hypoglycemic drugs: Secondary | ICD-10-CM | POA: Diagnosis not present

## 2023-02-26 DIAGNOSIS — E871 Hypo-osmolality and hyponatremia: Secondary | ICD-10-CM

## 2023-02-26 DIAGNOSIS — I10 Essential (primary) hypertension: Secondary | ICD-10-CM | POA: Diagnosis not present

## 2023-02-26 DIAGNOSIS — E1140 Type 2 diabetes mellitus with diabetic neuropathy, unspecified: Secondary | ICD-10-CM | POA: Insufficient documentation

## 2023-02-26 DIAGNOSIS — Z794 Long term (current) use of insulin: Secondary | ICD-10-CM | POA: Insufficient documentation

## 2023-02-26 DIAGNOSIS — I6523 Occlusion and stenosis of bilateral carotid arteries: Secondary | ICD-10-CM | POA: Diagnosis not present

## 2023-02-26 DIAGNOSIS — Z7982 Long term (current) use of aspirin: Secondary | ICD-10-CM | POA: Insufficient documentation

## 2023-02-26 DIAGNOSIS — R519 Headache, unspecified: Secondary | ICD-10-CM | POA: Insufficient documentation

## 2023-02-26 DIAGNOSIS — G9389 Other specified disorders of brain: Secondary | ICD-10-CM | POA: Diagnosis not present

## 2023-02-26 DIAGNOSIS — E039 Hypothyroidism, unspecified: Secondary | ICD-10-CM | POA: Diagnosis not present

## 2023-02-26 LAB — CBC WITH DIFFERENTIAL/PLATELET
Abs Immature Granulocytes: 0.01 10*3/uL (ref 0.00–0.07)
Basophils Absolute: 0 10*3/uL (ref 0.0–0.1)
Basophils Relative: 0 %
Eosinophils Absolute: 0.6 10*3/uL — ABNORMAL HIGH (ref 0.0–0.5)
Eosinophils Relative: 12 %
HCT: 34.9 % — ABNORMAL LOW (ref 36.0–46.0)
Hemoglobin: 11.2 g/dL — ABNORMAL LOW (ref 12.0–15.0)
Immature Granulocytes: 0 %
Lymphocytes Relative: 27 %
Lymphs Abs: 1.3 10*3/uL (ref 0.7–4.0)
MCH: 28.7 pg (ref 26.0–34.0)
MCHC: 32.1 g/dL (ref 30.0–36.0)
MCV: 89.5 fL (ref 80.0–100.0)
Monocytes Absolute: 0.5 10*3/uL (ref 0.1–1.0)
Monocytes Relative: 11 %
Neutro Abs: 2.4 10*3/uL (ref 1.7–7.7)
Neutrophils Relative %: 50 %
Platelets: 227 10*3/uL (ref 150–400)
RBC: 3.9 MIL/uL (ref 3.87–5.11)
RDW: 18 % — ABNORMAL HIGH (ref 11.5–15.5)
WBC: 4.9 10*3/uL (ref 4.0–10.5)
nRBC: 0 % (ref 0.0–0.2)

## 2023-02-26 LAB — BASIC METABOLIC PANEL
Anion gap: 10 (ref 5–15)
BUN: 25 mg/dL — ABNORMAL HIGH (ref 8–23)
CO2: 31 mmol/L (ref 22–32)
Calcium: 10.1 mg/dL (ref 8.9–10.3)
Chloride: 86 mmol/L — ABNORMAL LOW (ref 98–111)
Creatinine, Ser: 1.91 mg/dL — ABNORMAL HIGH (ref 0.44–1.00)
GFR, Estimated: 26 mL/min — ABNORMAL LOW (ref >60)
Glucose, Bld: 142 mg/dL — ABNORMAL HIGH (ref 70–99)
Potassium: 3.8 mmol/L (ref 3.5–5.1)
Sodium: 127 mmol/L — ABNORMAL LOW (ref 135–145)

## 2023-02-26 NOTE — ED Triage Notes (Signed)
Pt POV from home reporting headache and trouble walking around 6pm that has now resolved. No other focal deficits, axo x4.

## 2023-02-26 NOTE — ED Notes (Signed)
ED Provider at bedside. 

## 2023-02-26 NOTE — Discharge Instructions (Signed)
Return to the emergency department if you experience any new and/or concerning issues.  You should follow-up with your primary doctor in the next week for a repeat metabolic panel to recheck your sodium level.

## 2023-02-26 NOTE — ED Provider Notes (Signed)
[place] EMERGENCY DEPARTMENT AT [facility] Provider Note   CSN: [id number] Arrival date & time: 02/26/23  0012     History  Chief Complaint  Patient presents with   Headache   Gait Problem    Abigail Hoover is a 77 y.o. female.  Patient is a 77 year old female with past medical history of hypertension, type 2 diabetes, hypothyroidism, chronic renal insufficiency, diabetic neuropathy.  Patient presenting today for evaluation of head pain.  At approximately 6 PM, she experienced the sudden onset of right-sided head pain that began in the absence of any injury or trauma.  She stated that she felt off balance.  This sensation lasted for a short period of time (she is uncertain of how long), then resolved spontaneously.  Patient now feels fine, but presents wanting to make sure everything is okay.  She denies any weakness or numbness of the extremities.  She denies any visual disturbances.  She did not take any medication for these symptoms.  The history is provided by the patient.       Home Medications Prior to Admission medications   Medication Sig Start Date End Date Taking? Authorizing Provider  allopurinol (ZYLOPRIM) 100 MG tablet Take 1 tablet by mouth daily.    [provider]  aspirin EC 81 MG tablet Take 81 mg by mouth daily. Take 1 tablet by mouth once daily.    [provider]  colchicine 0.6 MG tablet Take 0.5 tablets (0.3 mg total) by mouth daily. 11/06/22   Pollyann Savoy, MD  Continuous Blood Gluc Sensor (DEXCOM G7 SENSOR) MISC Inject 1 sensor to the skin every 10 days for continuous glucose monitoring. 01/29/22   [provider]  Cyanocobalamin (VITAMIN B-12 IJ) Inject as directed every 3 (three) months.    [provider]  Darbepoetin Alfa (ARANESP) 60 MCG/0.3ML SOSY injection Inject 60 mcg into the skin.    [provider]  denosumab (PROLIA) 60 MG/ML SOSY injection Inject 60 mg into the skin every 6 (six)  months. Courier to rheum: [address], [address], [place] [address]. Appt 03/03/23 02/25/23   Pollyann Savoy, MD  docusate sodium (COLACE) 100 MG capsule Take 400 mg by mouth daily.    [provider]  famotidine (PEPCID) 40 MG tablet Take 40 mg by mouth 2 (two) times daily.    [provider]  gabapentin (NEURONTIN) 300 MG capsule Take 300 mg by mouth 3 (three) times daily. 09/29/18   [provider]  glucose blood (ONETOUCH VERIO) test strip Use Onetouch verio test strips to check blood sugar three times daily. (90 day RX) 05/04/21   Carlus Pavlov, MD  hydrocortisone 2.5 % ointment     [provider]  Insulin Glargine (BASAGLAR KWIKPEN) 100 UNIT/ML Inject 2 Units into the skin daily. Patient not taking: Reported on 02/04/2023 10/28/22   [provider]  Insulin Pen Needle (PEN NEEDLES 3/16") 31G X 5 MM MISC Use 1 pen needle to inject Forteo daily. 07/20/20   Pollyann Savoy, MD  Insulin Pen Needle 31G X 5 MM MISC Use with Novolog 05/28/19   Reather Littler, MD  Insulin Syringe-Needle U-100 25G X 5/8" 1 ML MISC USE DAILY TO INJECT INSULIN 05/26/18   Reather Littler, MD  isosorbide mononitrate (IMDUR) 120 MG 24 hr tablet Take 1 tablet by mouth every morning.    [provider]  Lancet Devices (ONE TOUCH DELICA LANCING DEV) MISC 1 each by Does not apply route daily. Use  as instructed to check blood sugar once daily. 08/06/19   Reather Littler, MD  Lancets [facility] DELICA PLUS [place]) MISC 4 (four) times daily. 12/07/21   [provider]  metoprolol succinate (TOPROL-XL) 50 MG 24 hr tablet Take 1 tablet (50 mg total) by mouth daily. Take with or immediately following a meal. 01/04/15   Rinaldo Cloud, MD  mirabegron ER (MYRBETRIQ) 50 MG TB24 tablet Take by mouth. 09/29/18   [provider]  nitroGLYCERIN (NITROSTAT) 0.4 MG SL tablet PLACE 1 TAB UNDER TONGUE FOR CHEST PAIN EVERY 5 MINUTES     MAXIMUM OF 3 DOSES     CALL 911 AFTER FIRST  DOSE. 07/23/22   Patwardhan, Manish J, MD  NOVOLOG FLEXPEN 100 UNIT/ML FlexPen INJECT 9-11 UNITS UNDER THE SKIN BEFORE the 3 meals Patient taking differently: 6-7 Units. [id number] UNITS UNDER THE SKIN BEFORE the 3 meals 02/19/21   Carlus Pavlov, MD  pantoprazole (PROTONIX) 40 MG tablet Take 40 mg by mouth every morning. 07/19/22   [provider]  pentosan polysulfate (ELMIRON) 100 MG capsule TAKE 1 CAPSULE (100 MG TOTAL) BY MOUTH 3 TIMES DAILY BEFORE MEALS. 09/29/18   [provider]  polyethylene glycol (MIRALAX / GLYCOLAX) 17 g packet as needed. 08/27/22   [provider]  pravastatin (PRAVACHOL) 20 MG tablet TAKE ONE TABLET BY MOUTH ONCE DAILY 07/30/21   Carlus Pavlov, MD  sitaGLIPtin (JANUVIA) 25 MG tablet Take 25 mg by mouth daily. Patient not taking: Reported on 02/04/2023    [provider]  sucralfate (CARAFATE) 1 g tablet 1 tab(s) crush and mix in water 4 times a day (between meals and at bedtime) for 90 days 10/02/20   [provider]  SYNTHROID 75 MCG tablet TAKE ONE TABLET BY MOUTH ONCE DAILY Patient taking differently: Take 75 mcg by mouth. 6 days and 1/2 tablet on Sunday 06/26/21   Carlus Pavlov, MD  torsemide (DEMADEX) 20 MG tablet Take 1 tablet by mouth daily.    [provider]  triamcinolone (KENALOG) 0.025 % ointment Apply 1 application  topically 2 (two) times daily. 04/25/21   [provider]      Allergies    Atorvastatin and Ezetimibe    Review of Systems   Review of Systems  All other systems reviewed and are negative.   Physical Exam Updated Vital Signs BP (!) 148/87   Pulse (!) 53   Temp 97.8 F (36.6 C) (Temporal)   Resp 19   Ht 5\' 1"  (1.549 m)   SpO2 100%   BMI 25.36 kg/m  Physical Exam Vitals and nursing note reviewed.  Constitutional:      General: She is not in acute distress.    Appearance: She is well-developed. She is not diaphoretic.  HENT:     Head: Normocephalic and  atraumatic.  Eyes:     Extraocular Movements: Extraocular movements intact.     Pupils: Pupils are equal, round, and reactive to light.  Cardiovascular:     Rate and Rhythm: Normal rate and regular rhythm.     Heart sounds: No murmur heard.    No friction rub. No gallop.  Pulmonary:     Effort: Pulmonary effort is normal. No respiratory distress.     Breath sounds: Normal breath sounds. No wheezing.  Abdominal:     General: Bowel sounds are normal. There is no distension.     Palpations: Abdomen is soft.     Tenderness: There is no abdominal tenderness.  Musculoskeletal:  General: Normal range of motion.     Cervical back: Normal range of motion and neck supple.  Skin:    General: Skin is warm and dry.  Neurological:     General: No focal deficit present.     Mental Status: She is alert and oriented to person, place, and time. Mental status is at baseline.     Cranial Nerves: No cranial nerve deficit or facial asymmetry.     ED Results / Procedures / Treatments   Labs (all labs ordered are listed, but only abnormal results are displayed) Labs Reviewed  BASIC METABOLIC PANEL  CBC WITH DIFFERENTIAL/PLATELET    EKG None  Radiology No results found.  Procedures Procedures    Medications Ordered in ED Medications - No data to display  ED Course/ Medical Decision Making/ A&P  Patient is a 77 year old female presenting with complaints of head pain as described in the HPI.  This started earlier this evening when she bent over to pick something up off of the floor.  Patient is unclear as to how long the discomfort lasted, but is now symptom-free and feels fine.  She is neurologically intact and vital signs are stable.  Workup initiated including CBC and metabolic panel, both of which are basically unremarkable.  It does show her baseline renal insufficiency and sodium of 127.  CT scan of the head obtained showing no acute intracranial abnormality.  Patient has  remained symptom-free throughout her ED stay and workup is unremarkable.  I am uncertain as to the exact etiology of her symptoms, but could be related to a nerve in her scalp or possibly some sort of migraine phenomenon.  I highly doubt stroke or TIA given the nature of the symptoms.  Patient is to be discharged with as needed return.  I did instruct her to follow-up with her primary doctor for repeat of her sodium in the next week.  Final Clinical Impression(s) / ED Diagnoses Final diagnoses:  None    Rx / DC Orders ED Discharge Orders     None         Geoffery Lyons, MD 02/26/23 [phone]

## 2023-02-27 ENCOUNTER — Telehealth: Payer: Self-pay | Admitting: *Deleted

## 2023-02-27 NOTE — Telephone Encounter (Signed)
Low cardiac risk for the above procedure. Lajoyce Corners, can we check when her PET/CT will be?  Thanks MJP

## 2023-02-27 NOTE — Telephone Encounter (Signed)
Good morning, cardiac PET/CT has not yet been scheduled.  Anesthesia for her middle finger pulley release is planned for local with IV sedation.  Thomasene Ripple. Tyland Klemens NP-C     02/27/2023, 10:19 AM [facility] [address] Office [phone] Fax [phone]

## 2023-02-27 NOTE — Telephone Encounter (Signed)
1. Is the surgery under general anesthesia? 2. Is the PET/CT scheduled yet?  Thanks MJP

## 2023-02-27 NOTE — Telephone Encounter (Signed)
Abigail Hoover 77 year old female is requesting preoperative cardiac evaluation for left middle finger A1 pulley release.  She was recently seen in the clinic by you on 02/04/2023.  Would you be willing to comment on cardiac risk for her upcoming procedure?  She continued to have symptoms of chest pain and substernal chest pain lasting for several minutes both at rest and with exertion.  Cardiac PET CT was recommended but has not yet been completed.  Echocardiogram 01/21/2022 showed normal systolic function, G1 DD, mild mitral valve regurgitation.  Underwent stress testing 10/23 which was unremarkable.  Please direct your response to CV DIV preop pool.  Thank you for your help.  Thomasene Ripple. Kateryn Marasigan NP-C     02/27/2023, 10:04 AM [facility] [address] Office [phone] Fax [phone]

## 2023-02-27 NOTE — Telephone Encounter (Signed)
     Primary Cardiologist: Dr. Rosemary Holms  Chart reviewed as part of pre-operative protocol coverage. Given past medical history and time since last visit, based on ACC/AHA guidelines, Abigail Hoover would be at acceptable risk for the planned procedure without further cardiovascular testing.   Patient was recently seen in the cardiology clinic.  She is felt to be low risk for her upcoming surgical procedure.  Case was reviewed with Dr. Rosemary Holms.  I will route this recommendation to the requesting party via Epic fax function and remove from pre-op pool.  Please call with questions.  Thomasene Ripple. Donyale Berthold NP-C     02/27/2023, 10:54 AM [facility] [address] Office [phone] Fax [phone]

## 2023-02-27 NOTE — Telephone Encounter (Signed)
Abigail Hoover, Abigail Hoover - 02/27/2023  8:52 AM Patwardhan, Anabel Bene, MD [phone])  Sent: Thu February 27, 2023 10:47 AM  To: Loa Socks, LPN         Message  Thank you for checking.

## 2023-02-27 NOTE — Telephone Encounter (Signed)
   Pre-operative Risk Assessment    Patient Name: Abigail Hoover  DOB: 1945/08/01 MRN: [id number]  DATE OF LAST VISIT: 02/04/23 DR. PATWARDHAN DATE OF NEXT VISIT: 05/05/23 DR. PATWARDHAN    Request for Surgical Clearance    Procedure:   LEFT MIDDLE FINGER A1 PULLEY RLEASE  Date of Surgery:  Clearance 04/08/23                                 Surgeon:  DR. Dominica Severin Surgeon's Group or Practice Name:  [facility] Phone number:  [phone] Fax number:  [phone] ATTN: Rosalva Ferron   Type of Clearance Requested:   - Medical  - Pharmacy:  Hold Aspirin     Type of Anesthesia:  Local  WITH IV SEDATION   Additional requests/questions:    [name]   02/27/2023, 8:54 AM

## 2023-02-27 NOTE — Telephone Encounter (Signed)
Per [facility] PET Schedulers note, they called the pt to schedule this and both the pt and daughter wanted to think more on getting the test done, and would call back when they were ready to schedule.

## 2023-03-02 DIAGNOSIS — Z794 Long term (current) use of insulin: Secondary | ICD-10-CM | POA: Diagnosis not present

## 2023-03-02 DIAGNOSIS — E1142 Type 2 diabetes mellitus with diabetic polyneuropathy: Secondary | ICD-10-CM | POA: Diagnosis not present

## 2023-03-02 DIAGNOSIS — E039 Hypothyroidism, unspecified: Secondary | ICD-10-CM | POA: Diagnosis not present

## 2023-03-02 DIAGNOSIS — E1122 Type 2 diabetes mellitus with diabetic chronic kidney disease: Secondary | ICD-10-CM | POA: Diagnosis not present

## 2023-03-02 DIAGNOSIS — I10 Essential (primary) hypertension: Secondary | ICD-10-CM | POA: Diagnosis not present

## 2023-03-03 ENCOUNTER — Ambulatory Visit: Payer: Medicare Other | Attending: Rheumatology | Admitting: Pharmacist

## 2023-03-03 ENCOUNTER — Ambulatory Visit: Payer: Medicare Other | Admitting: Pharmacist

## 2023-03-03 DIAGNOSIS — M8000XS Age-related osteoporosis with current pathological fracture, unspecified site, sequela: Secondary | ICD-10-CM

## 2023-03-03 DIAGNOSIS — I10 Essential (primary) hypertension: Secondary | ICD-10-CM | POA: Diagnosis not present

## 2023-03-03 DIAGNOSIS — E1142 Type 2 diabetes mellitus with diabetic polyneuropathy: Secondary | ICD-10-CM | POA: Diagnosis not present

## 2023-03-03 DIAGNOSIS — Z7689 Persons encountering health services in other specified circumstances: Secondary | ICD-10-CM

## 2023-03-03 DIAGNOSIS — M810 Age-related osteoporosis without current pathological fracture: Secondary | ICD-10-CM | POA: Diagnosis not present

## 2023-03-03 DIAGNOSIS — E039 Hypothyroidism, unspecified: Secondary | ICD-10-CM | POA: Diagnosis not present

## 2023-03-03 DIAGNOSIS — Z794 Long term (current) use of insulin: Secondary | ICD-10-CM | POA: Diagnosis not present

## 2023-03-03 MED ORDER — DENOSUMAB 60 MG/ML ~~LOC~~ SOSY
60.0000 mg | PREFILLED_SYRINGE | Freq: Once | SUBCUTANEOUS | Status: AC
  Administered 2023-03-03: 60 mg via SUBCUTANEOUS

## 2023-03-03 NOTE — Progress Notes (Signed)
Pharmacy Note  Subjective:   Patient presents to clinic today to receive bi-annual dose of Prolia. Patient's last dose of Prolia was on 08/06/22  Patient running a fever or have signs/symptoms of infection? No  Patient currently on antibiotics for the treatment of infection? No  Patient had fall in the last 6 months?  No   Patient taking calcium 1200 mg daily through diet or supplement and at least 800 units vitamin D? Yes  Objective: CMP     Component Value Date/Time   NA 127 (L) 02/26/2023 0257   NA 140 01/18/2022 1150   NA 138 12/12/2016 1150   K 3.8 02/26/2023 0257   K 4.6 12/12/2016 1150   CL 86 (L) 02/26/2023 0257   CL 103 07/27/2012 1259   CO2 31 02/26/2023 0257   CO2 31 (H) 12/12/2016 1150   GLUCOSE 142 (H) 02/26/2023 0257   GLUCOSE 149 (H) 12/12/2016 1150   GLUCOSE 118 (H) 07/27/2012 1259   BUN 25 (H) 02/26/2023 0257   BUN 28 (H) 01/18/2022 1150   BUN 23.4 12/12/2016 1150   CREATININE 1.91 (H) 02/26/2023 0257   CREATININE 2.07 (H) 02/13/2023 1119   CREATININE 2.31 (H) 07/23/2022 1413   CREATININE 1.7 (H) 12/12/2016 1150   CALCIUM 10.1 02/26/2023 0257   CALCIUM 11.4 (H) 12/12/2016 1150   PROT 6.0 (L) 02/13/2023 1119   PROT 6.5 12/12/2016 1150   ALBUMIN 3.9 02/13/2023 1119   ALBUMIN 3.9 12/12/2016 1150   AST 18 02/13/2023 1119   AST 19 12/12/2016 1150   ALT 12 02/13/2023 1119   ALT 10 12/12/2016 1150   ALKPHOS 59 02/13/2023 1119   ALKPHOS 65 12/12/2016 1150   BILITOT 0.4 02/13/2023 1119   BILITOT 0.36 12/12/2016 1150   GFRNONAA 26 (L) 02/26/2023 0257   GFRNONAA 24 (L) 02/13/2023 1119   GFRNONAA 21 (L) 09/20/2019 1506   GFRAA 25 (L) 09/20/2019 1506    CBC    Component Value Date/Time   WBC 4.9 02/26/2023 0257   RBC 3.90 02/26/2023 0257   HGB 11.2 (L) 02/26/2023 0257   HGB 9.6 (L) 02/13/2023 1119   HGB 9.0 (L) 01/18/2022 1150   HGB 10.9 (L) 03/21/2017 1221   HCT 34.9 (L) 02/26/2023 0257   HCT 29.4 (L) 01/18/2022 1150   HCT 35.9 03/21/2017 1221    PLT 227 02/26/2023 0257   PLT 156 02/13/2023 1119   PLT 164 01/18/2022 1150   MCV 89.5 02/26/2023 0257   MCV 90 01/18/2022 1150   MCV 82.5 03/21/2017 1221   MCH 28.7 02/26/2023 0257   MCHC 32.1 02/26/2023 0257   RDW 18.0 (H) 02/26/2023 0257   RDW 13.1 01/18/2022 1150   RDW 15.7 (H) 03/21/2017 1221   LYMPHSABS 1.3 02/26/2023 0257   LYMPHSABS 1.1 03/21/2017 1221   MONOABS 0.5 02/26/2023 0257   MONOABS 0.3 03/21/2017 1221   EOSABS 0.6 (H) 02/26/2023 0257   EOSABS 0.3 03/21/2017 1221   BASOSABS 0.0 02/26/2023 0257   BASOSABS 0.0 03/21/2017 1221    Lab Results  Component Value Date   VD25OH 76.06 02/13/2023    T-score: March 27, 2022 T-score -2.4, BMD 0.587 right femoral neck, no comparison, left one third radius T-score -0.60, BMD 0.659-1% change, right total femur 8% change.   Assessment/Plan:   Reviewed importance of adequate dietary intake of calcium in addition to supplementation due to risk of hypocalcemia with Prolia.   Patient tolerated injection well.   Administration details as below: Administrations This Visit  denosumab (PROLIA) injection 60 mg     Admin Date 03/03/2023 Action Given Dose 60 mg Route Subcutaneous Documented By Murrell Redden, RPH-CPP           Patient's next Prolia dose is due on 08/30/2023.  Patient is due for updated DEXA in January 2026.   All questions encouraged and answered.  Instructed patient to call with any further questions or concerns.

## 2023-03-07 ENCOUNTER — Encounter: Payer: Self-pay | Admitting: Podiatry

## 2023-03-07 ENCOUNTER — Ambulatory Visit (INDEPENDENT_AMBULATORY_CARE_PROVIDER_SITE_OTHER): Payer: Medicare Other | Admitting: Podiatry

## 2023-03-07 DIAGNOSIS — M79675 Pain in left toe(s): Secondary | ICD-10-CM

## 2023-03-07 DIAGNOSIS — E1142 Type 2 diabetes mellitus with diabetic polyneuropathy: Secondary | ICD-10-CM | POA: Diagnosis not present

## 2023-03-07 DIAGNOSIS — M79674 Pain in right toe(s): Secondary | ICD-10-CM

## 2023-03-07 DIAGNOSIS — B351 Tinea unguium: Secondary | ICD-10-CM

## 2023-03-07 NOTE — Progress Notes (Signed)
This patient returns to my office for at risk foot care.  This patient requires this care by a professional since this patient will be at risk due to having diabetes and CKD. This patient is unable to cut nails herself since the patient cannot reach her nails.These nails are painful walking and wearing shoes.  This patient presents for at risk foot care today.  General Appearance  Alert, conversant and in no acute stress.  Vascular  Dorsalis pedis and posterior tibial  pulses are palpable  bilaterally.  Capillary return is within normal limits  bilaterally. Temperature is within normal limits  bilaterally.  Neurologic  Senn-Weinstein monofilament wire test within normal limits  bilaterally. Muscle power within normal limits bilaterally.  Nails Thick disfigured discolored nails with subungual debris  from hallux to fifth toes bilaterally. No evidence of bacterial infection or drainage bilaterally.  Orthopedic  No limitations of motion  feet .  No crepitus or effusions noted.  No bony pathology or digital deformities noted.  Skin  normotropic skin with no porokeratosis noted bilaterally.  No signs of infections or ulcers noted.     Onychomycosis  Pain in right toes  Pain in left toes  Consent was obtained for treatment procedures.   Mechanical debridement of nails 1-5  bilaterally performed with a nail nipper.  Filed with dremel without incident.    Return office visit    4 months                  Told patient to return for periodic foot care and evaluation due to potential at risk complications.   Nicholes Rough DPM

## 2023-03-20 ENCOUNTER — Other Ambulatory Visit: Payer: Self-pay | Admitting: Rheumatology

## 2023-03-20 NOTE — Telephone Encounter (Signed)
Last Fill: 11/06/2022  Labs: 02/26/2023 Hgb 11.2, Hct 34.9, RDW 18.0, Eosinophils Absolute 0.6, Sodium 127, Chloride 86, Glucose 142, BUN 25, Creat. 1.91, GFR 26  Next Visit: 05/08/2023  Last Visit: 11/06/2022  DX: Chondrocalcinosis   Current Dose per office note 11/06/2022:  Colchicine 0.3mg  by mouth daily.   Okay to refill Colchicine?

## 2023-04-02 DIAGNOSIS — E1122 Type 2 diabetes mellitus with diabetic chronic kidney disease: Secondary | ICD-10-CM | POA: Diagnosis not present

## 2023-04-03 DIAGNOSIS — S7001XA Contusion of right hip, initial encounter: Secondary | ICD-10-CM | POA: Diagnosis not present

## 2023-04-03 DIAGNOSIS — M545 Low back pain, unspecified: Secondary | ICD-10-CM | POA: Diagnosis not present

## 2023-04-08 DIAGNOSIS — M5416 Radiculopathy, lumbar region: Secondary | ICD-10-CM | POA: Diagnosis not present

## 2023-04-09 ENCOUNTER — Other Ambulatory Visit: Payer: Self-pay

## 2023-04-09 ENCOUNTER — Emergency Department (HOSPITAL_BASED_OUTPATIENT_CLINIC_OR_DEPARTMENT_OTHER)
Admission: EM | Admit: 2023-04-09 | Discharge: 2023-04-09 | Disposition: A | Discharge status: Home / Self Care | Payer: Medicare Other | Attending: Emergency Medicine | Admitting: Emergency Medicine

## 2023-04-09 ENCOUNTER — Emergency Department (HOSPITAL_BASED_OUTPATIENT_CLINIC_OR_DEPARTMENT_OTHER): Payer: Medicare Other

## 2023-04-09 ENCOUNTER — Other Ambulatory Visit: Payer: Self-pay | Admitting: Orthopaedic Surgery

## 2023-04-09 DIAGNOSIS — Z981 Arthrodesis status: Secondary | ICD-10-CM | POA: Diagnosis not present

## 2023-04-09 DIAGNOSIS — M25552 Pain in left hip: Secondary | ICD-10-CM | POA: Diagnosis not present

## 2023-04-09 DIAGNOSIS — W19XXXA Unspecified fall, initial encounter: Secondary | ICD-10-CM

## 2023-04-09 DIAGNOSIS — S0990XA Unspecified injury of head, initial encounter: Secondary | ICD-10-CM | POA: Diagnosis not present

## 2023-04-09 DIAGNOSIS — Z7982 Long term (current) use of aspirin: Secondary | ICD-10-CM | POA: Insufficient documentation

## 2023-04-09 DIAGNOSIS — M5416 Radiculopathy, lumbar region: Secondary | ICD-10-CM

## 2023-04-09 DIAGNOSIS — G319 Degenerative disease of nervous system, unspecified: Secondary | ICD-10-CM | POA: Diagnosis not present

## 2023-04-09 DIAGNOSIS — Z96642 Presence of left artificial hip joint: Secondary | ICD-10-CM | POA: Diagnosis not present

## 2023-04-09 DIAGNOSIS — Z043 Encounter for examination and observation following other accident: Secondary | ICD-10-CM | POA: Diagnosis not present

## 2023-04-09 DIAGNOSIS — Y9301 Activity, walking, marching and hiking: Secondary | ICD-10-CM | POA: Diagnosis not present

## 2023-04-09 DIAGNOSIS — Z794 Long term (current) use of insulin: Secondary | ICD-10-CM | POA: Diagnosis not present

## 2023-04-09 DIAGNOSIS — W010XXA Fall on same level from slipping, tripping and stumbling without subsequent striking against object, initial encounter: Secondary | ICD-10-CM | POA: Diagnosis not present

## 2023-04-09 DIAGNOSIS — N6342 Unspecified lump in left breast, subareolar: Secondary | ICD-10-CM | POA: Insufficient documentation

## 2023-04-09 DIAGNOSIS — I6782 Cerebral ischemia: Secondary | ICD-10-CM | POA: Diagnosis not present

## 2023-04-09 NOTE — ED Provider Notes (Signed)
[place] EMERGENCY DEPARTMENT AT [facility] Provider Note   CSN: [id number] Arrival date & time: 04/09/23  1732     History  Chief Complaint  Patient presents with   Headache   Fall    ANAAYA COULSON is a 78 y.o. female.  78 year old female with history of left hip arthroplasty who presents emergency department after a fall.  Patient fell last Wednesday.  Tripped while she was walking out to the living room and landed on her right side.  Did have head strike.  No LOC.  Not on blood thinners aside from aspirin.  Says that since then has been having some headache and left hip pain.  They went to an outpatient orthopedist who did x-rays of the right hip and lumbar spine did not show evidence of fracture.  Presented today because she is having a persistent headache and wants a head CT.  Also says that she is having left hip pain though this was not the side that she landed on.  Also says that she noticed a breast lump recently that she would like to be evaluated.       Home Medications Prior to Admission medications   Medication Sig Start Date End Date Taking? Authorizing Provider  allopurinol (ZYLOPRIM) 100 MG tablet Take 1 tablet by mouth daily.    [provider]  aspirin EC 81 MG tablet Take 81 mg by mouth daily. Take 1 tablet by mouth once daily.    [provider]  colchicine 0.6 MG tablet TAKE 1/2 TABLET (0.3 MG TOTAL) BY MOUTH DAILY. 03/20/23   Gearldine Bienenstock, PA-C  Continuous Blood Gluc Sensor (DEXCOM G7 SENSOR) MISC Inject 1 sensor to the skin every 10 days for continuous glucose monitoring. 01/29/22   [provider]  Cyanocobalamin (VITAMIN B-12 IJ) Inject as directed every 3 (three) months.    [provider]  Darbepoetin Alfa (ARANESP) 60 MCG/0.3ML SOSY injection Inject 60 mcg into the skin.    [provider]  denosumab (PROLIA) 60 MG/ML SOSY injection Inject 60 mg into the skin every 6 (six) months. Courier to  rheum: [address], [place] [address]. Appt 03/03/23 02/25/23   Pollyann Savoy, MD  docusate sodium (COLACE) 100 MG capsule Take 400 mg by mouth daily.    [provider]  famotidine (PEPCID) 40 MG tablet Take 40 mg by mouth 2 (two) times daily.    [provider]  gabapentin (NEURONTIN) 300 MG capsule Take 300 mg by mouth 3 (three) times daily. 09/29/18   [provider]  glucose blood (ONETOUCH VERIO) test strip Use Onetouch verio test strips to check blood sugar three times daily. (90 day RX) 05/04/21   Carlus Pavlov, MD  hydrocortisone 2.5 % ointment     [provider]  Insulin Glargine (BASAGLAR KWIKPEN) 100 UNIT/ML Inject 2 Units into the skin daily. 10/28/22   [provider]  Insulin Pen Needle (PEN NEEDLES 3/16") 31G X 5 MM MISC Use 1 pen needle to inject Forteo daily. 07/20/20   Pollyann Savoy, MD  Insulin Pen Needle 31G X 5 MM MISC Use with Novolog 05/28/19   Reather Littler, MD  Insulin Syringe-Needle U-100 25G X 5/8" 1 ML MISC USE DAILY TO INJECT INSULIN 05/26/18   Reather Littler, MD  isosorbide mononitrate (IMDUR) 120 MG 24 hr tablet Take 1 tablet by mouth every morning.    [provider]  Lancet Devices (ONE TOUCH DELICA LANCING DEV) MISC 1  each by Does not apply route daily. Use as instructed to check blood sugar once daily. 08/06/19   Reather Littler, MD  Lancets [facility] DELICA PLUS [place]) MISC 4 (four) times daily. 12/07/21   [provider]  metoprolol succinate (TOPROL-XL) 50 MG 24 hr tablet Take 1 tablet (50 mg total) by mouth daily. Take with or immediately following a meal. 01/04/15   Rinaldo Cloud, MD  mirabegron ER (MYRBETRIQ) 50 MG TB24 tablet Take by mouth. 09/29/18   [provider]  nitroGLYCERIN (NITROSTAT) 0.4 MG SL tablet PLACE 1 TAB UNDER TONGUE FOR CHEST PAIN EVERY 5 MINUTES     MAXIMUM OF 3 DOSES     CALL 911 AFTER FIRST DOSE. 07/23/22   Patwardhan, Manish J, MD  NOVOLOG FLEXPEN 100  UNIT/ML FlexPen INJECT 9-11 UNITS UNDER THE SKIN BEFORE the 3 meals Patient taking differently: 6-7 Units. [id number] UNITS UNDER THE SKIN BEFORE the 3 meals 02/19/21   Carlus Pavlov, MD  pantoprazole (PROTONIX) 40 MG tablet Take 40 mg by mouth every morning. 07/19/22   [provider]  pentosan polysulfate (ELMIRON) 100 MG capsule TAKE 1 CAPSULE (100 MG TOTAL) BY MOUTH 3 TIMES DAILY BEFORE MEALS. 09/29/18   [provider]  polyethylene glycol (MIRALAX / GLYCOLAX) 17 g packet as needed. 08/27/22   [provider]  pravastatin (PRAVACHOL) 20 MG tablet TAKE ONE TABLET BY MOUTH ONCE DAILY 07/30/21   Carlus Pavlov, MD  sitaGLIPtin (JANUVIA) 25 MG tablet Take 25 mg by mouth daily.    [provider]  sucralfate (CARAFATE) 1 g tablet 1 tab(s) crush and mix in water 4 times a day (between meals and at bedtime) for 90 days 10/02/20   [provider]  SYNTHROID 75 MCG tablet TAKE ONE TABLET BY MOUTH ONCE DAILY Patient taking differently: Take 75 mcg by mouth. 6 days and 1/2 tablet on Sunday 06/26/21   Carlus Pavlov, MD  torsemide (DEMADEX) 20 MG tablet Take 1 tablet by mouth daily.    [provider]  triamcinolone (KENALOG) 0.025 % ointment Apply 1 application  topically 2 (two) times daily. 04/25/21   [provider]      Allergies    Atorvastatin and Ezetimibe    Review of Systems   Review of Systems  Physical Exam Updated Vital Signs BP 118/65 (BP Location: Right Arm)   Pulse 64   Temp 98 F (36.7 C) (Oral)   Resp 14   Ht 5\' 1"  (1.549 m)   SpO2 100%   BMI 25.36 kg/m  Physical Exam Constitutional:      General: She is not in acute distress.    Appearance: Normal appearance. She is not ill-appearing.  HENT:     Head: Normocephalic and atraumatic.     Right Ear: External ear normal.     Left Ear: External ear normal.     Mouth/Throat:     Mouth: Mucous membranes are moist.     Pharynx: Oropharynx is clear.  Eyes:      Extraocular Movements: Extraocular movements intact.     Conjunctiva/sclera: Conjunctivae normal.     Pupils: Pupils are equal, round, and reactive to light.  Neck:     Comments: No C-spine midline tenderness to palpation Cardiovascular:     Rate and Rhythm: Normal rate and regular rhythm.     Pulses: Normal pulses.     Heart sounds: Normal heart sounds.  Pulmonary:     Effort: Pulmonary effort is normal. No respiratory distress.  Breath sounds: Normal breath sounds.  Chest:       Comments: Breast without any overlying erythema or redness or swelling. Abdominal:     General: Abdomen is flat. There is no distension.     Palpations: Abdomen is soft. There is no mass.     Tenderness: There is no abdominal tenderness. There is no guarding.  Musculoskeletal:        General: No deformity. Normal range of motion.     Cervical back: No rigidity or tenderness.     Comments: No tenderness to palpation of chest wall. No tenderness to palpation of bilateral clavicles.  No tenderness to palpation or deformities noted of bilateral shoulders, elbows, wrists, knees, or ankles.  Tenderness to palpation of left hip.  Neurological:     General: No focal deficit present.     Mental Status: She is alert and oriented to person, place, and time. Mental status is at baseline.     Cranial Nerves: No cranial nerve deficit.     Sensory: No sensory deficit.     Motor: No weakness.     ED Results / Procedures / Treatments   Labs (all labs ordered are listed, but only abnormal results are displayed) Labs Reviewed - No data to display  EKG None  Radiology DG Hip Unilat W or Wo Pelvis 2-3 Views Left Result Date: 04/09/2023 CLINICAL DATA:  Hip pain post fall EXAM: DG HIP (WITH OR WITHOUT PELVIS) 2-3V LEFT COMPARISON:  03/02/2016 FINDINGS: SI joints are patent. Pubic symphysis and rami appear intact. Mild to moderate right hip degenerative change. Left hip replacement with intact hardware and  normal alignment. Heterotopic ossification about the left hip. Focal cortical thickening lateral shaft of the proximal right femur IMPRESSION: 1. Left hip replacement with intact hardware and normal alignment. No acute left hip fracture 2. Focal cortical bone thickening proximal shaft of right femur with possible faintly visible horizontal lucency, question stress fracture Electronically Signed   By: Jasmine Pang M.D.   On: 04/09/2023 22:53   CT Head Wo Contrast Result Date: 04/09/2023 CLINICAL DATA:  Headache mechanical fall EXAM: CT HEAD WITHOUT CONTRAST CT CERVICAL SPINE WITHOUT CONTRAST TECHNIQUE: Multidetector CT imaging of the head and cervical spine was performed following the standard protocol without intravenous contrast. Multiplanar CT image reconstructions of the cervical spine were also generated. RADIATION DOSE REDUCTION: This exam was performed according to the departmental dose-optimization program which includes automated exposure control, adjustment of the mA and/or kV according to patient size and/or use of iterative reconstruction technique. COMPARISON:  CT brain 02/26/2023, MRI 06/08/2015 FINDINGS: CT HEAD FINDINGS Brain: No acute territorial infarction, hemorrhage or intracranial mass. Atrophy and mild chronic small vessel ischemic changes of the white matter. Small chronic left cerebellar infarct. Stable ventricle size Vascular: No hyperdense vessels.  Carotid vascular calcification Skull: Normal. Negative for fracture or focal lesion. Sinuses/Orbits: No acute finding. Other: None CT CERVICAL SPINE FINDINGS Alignment: No subluxation.  Facet alignment within normal limits Skull base and vertebrae: No acute fracture. No primary bone lesion or focal pathologic process. Soft tissues and spinal canal: No prevertebral fluid or swelling. No visible canal hematoma. Disc levels: Anterior fusion hardware C4 through C7. Moderate degenerative changes at C2-C3, C3-C4 and C7-T1. Multilevel foraminal  narrowing. Upper chest: Negative. Other: None IMPRESSION: 1. No CT evidence for acute intracranial abnormality. Atrophy and chronic small vessel ischemic changes of the white matter. Small chronic left cerebellar infarct. 2. Anterior fusion hardware C4 through C7. No acute  osseous abnormality. Electronically Signed   By: Jasmine Pang M.D.   On: 04/09/2023 21:17   CT Cervical Spine Wo Contrast Result Date: 04/09/2023 CLINICAL DATA:  Headache mechanical fall EXAM: CT HEAD WITHOUT CONTRAST CT CERVICAL SPINE WITHOUT CONTRAST TECHNIQUE: Multidetector CT imaging of the head and cervical spine was performed following the standard protocol without intravenous contrast. Multiplanar CT image reconstructions of the cervical spine were also generated. RADIATION DOSE REDUCTION: This exam was performed according to the departmental dose-optimization program which includes automated exposure control, adjustment of the mA and/or kV according to patient size and/or use of iterative reconstruction technique. COMPARISON:  CT brain 02/26/2023, MRI 06/08/2015 FINDINGS: CT HEAD FINDINGS Brain: No acute territorial infarction, hemorrhage or intracranial mass. Atrophy and mild chronic small vessel ischemic changes of the white matter. Small chronic left cerebellar infarct. Stable ventricle size Vascular: No hyperdense vessels.  Carotid vascular calcification Skull: Normal. Negative for fracture or focal lesion. Sinuses/Orbits: No acute finding. Other: None CT CERVICAL SPINE FINDINGS Alignment: No subluxation.  Facet alignment within normal limits Skull base and vertebrae: No acute fracture. No primary bone lesion or focal pathologic process. Soft tissues and spinal canal: No prevertebral fluid or swelling. No visible canal hematoma. Disc levels: Anterior fusion hardware C4 through C7. Moderate degenerative changes at C2-C3, C3-C4 and C7-T1. Multilevel foraminal narrowing. Upper chest: Negative. Other: None IMPRESSION: 1. No CT evidence  for acute intracranial abnormality. Atrophy and chronic small vessel ischemic changes of the white matter. Small chronic left cerebellar infarct. 2. Anterior fusion hardware C4 through C7. No acute osseous abnormality. Electronically Signed   By: Jasmine Pang M.D.   On: 04/09/2023 21:17    Procedures Procedures    Medications Ordered in ED Medications - No data to display  ED Course/ Medical Decision Making/ A&P                                 Medical Decision Making Amount and/or Complexity of Data Reviewed Radiology: ordered.   PASCALE DESCHAMP is a 78 y.o. female with comorbidities that complicate the patient evaluation including left hip arthroplasty who presents with head trauma and hip pain after a fall  Initial Ddx:  TBI, c-spine injury, mechanical fall, syncope   MDM:  Given the patient's fall, headache, and age will obtain a head CT at this time to rule out TBI though feel this is less likely given the duration of her symptoms.  With their age we will also obtain a CT of the C-spine as well.  Appears that this was a result of mechanical fall and did not have any preceding symptoms that would suggest syncope.  Is also having hip pain though this is not the hip that she landed on.  They are requesting x-rays which will be performed at this time.  Also has a breast nodule that was palpated that will need follow-up.  Plan:  CT head CT C-spine Left hip x-ray  ED Summary/Re-evaluation:  No acute injuries were found on the patient's imaging.  Will have them follow-up with thier primary doctor in several days regarding their symptoms.  Given information for the breast clinic for repeat exam and possible mammogram.  This patient presents to the ED for concern of complaints listed in HPI, this involves an extensive number of treatment options, and is a complaint that carries with it a high risk of complications and morbidity. Disposition including potential need for  admission  considered.   Dispo: DC Home. Return precautions discussed including, but not limited to, those listed in the AVS. Allowed pt time to ask questions which were answered fully prior to dc.  Additional history obtained from son Records reviewed Outpatient Clinic Notes I independently reviewed the following imaging with scope of interpretation limited to determining acute life threatening conditions related to emergency care: CT Head and agree with the radiologist interpretation with the following exceptions: none I have reviewed the patients home medications and made adjustments as needed Social Determinants of health:  Elderly   Final Clinical Impression(s) / ED Diagnoses Final diagnoses:  Fall, initial encounter  Minor head injury, initial encounter  Pain of left hip  Subareolar mass of left breast    Rx / DC Orders ED Discharge Orders     None         Rondel Baton, MD 04/10/23 1225

## 2023-04-09 NOTE — Discharge Instructions (Signed)
 You were seen for your hip pain and headache in the emergency department.   At home, please take tylenol  for your pain.    Check your MyChart online for the results of any tests that had not resulted by the time you left the emergency department.   Follow-up with your primary doctor in 2-3 days regarding your visit. Follow-up with the breast center about your breast mass.   Return immediately to the emergency department if you experience any of the following: worsening pain, or any other concerning symptoms.    Thank you for visiting our Emergency Department. It was a pleasure taking care of you today.

## 2023-04-09 NOTE — ED Triage Notes (Addendum)
 Pt POV from home ambulatory to triage reporting persistent headache after mechancical fall last Wed, hit back of head. Seen by [facility] next day, xrays neg, told to come to ED if headache is persistent. Denies blurred vision or n/v. No thinners.

## 2023-04-10 ENCOUNTER — Encounter (INDEPENDENT_AMBULATORY_CARE_PROVIDER_SITE_OTHER): Payer: Medicare Other | Admitting: Ophthalmology

## 2023-04-10 ENCOUNTER — Other Ambulatory Visit: Payer: Self-pay | Admitting: Family Medicine

## 2023-04-10 DIAGNOSIS — E113393 Type 2 diabetes mellitus with moderate nonproliferative diabetic retinopathy without macular edema, bilateral: Secondary | ICD-10-CM | POA: Diagnosis not present

## 2023-04-10 DIAGNOSIS — Z7985 Long-term (current) use of injectable non-insulin antidiabetic drugs: Secondary | ICD-10-CM

## 2023-04-10 DIAGNOSIS — I10 Essential (primary) hypertension: Secondary | ICD-10-CM

## 2023-04-10 DIAGNOSIS — H35033 Hypertensive retinopathy, bilateral: Secondary | ICD-10-CM

## 2023-04-10 DIAGNOSIS — Z794 Long term (current) use of insulin: Secondary | ICD-10-CM

## 2023-04-10 DIAGNOSIS — N6342 Unspecified lump in left breast, subareolar: Secondary | ICD-10-CM

## 2023-04-10 DIAGNOSIS — H43813 Vitreous degeneration, bilateral: Secondary | ICD-10-CM

## 2023-04-11 ENCOUNTER — Inpatient Hospital Stay: Payer: Medicare Other

## 2023-04-11 ENCOUNTER — Encounter: Payer: Self-pay | Admitting: Hematology and Oncology

## 2023-04-11 ENCOUNTER — Inpatient Hospital Stay: Payer: Medicare Other | Attending: Hematology and Oncology | Admitting: Hematology and Oncology

## 2023-04-11 VITALS — BP 130/60 | HR 62 | Temp 98.6°F | Resp 18 | Ht 61.0 in | Wt 130.6 lb

## 2023-04-11 DIAGNOSIS — Z79899 Other long term (current) drug therapy: Secondary | ICD-10-CM | POA: Diagnosis not present

## 2023-04-11 DIAGNOSIS — D631 Anemia in chronic kidney disease: Secondary | ICD-10-CM | POA: Diagnosis not present

## 2023-04-11 DIAGNOSIS — Z8673 Personal history of transient ischemic attack (TIA), and cerebral infarction without residual deficits: Secondary | ICD-10-CM | POA: Diagnosis not present

## 2023-04-11 DIAGNOSIS — M47812 Spondylosis without myelopathy or radiculopathy, cervical region: Secondary | ICD-10-CM | POA: Insufficient documentation

## 2023-04-11 DIAGNOSIS — Z803 Family history of malignant neoplasm of breast: Secondary | ICD-10-CM | POA: Insufficient documentation

## 2023-04-11 DIAGNOSIS — E538 Deficiency of other specified B group vitamins: Secondary | ICD-10-CM | POA: Diagnosis not present

## 2023-04-11 DIAGNOSIS — N183 Chronic kidney disease, stage 3 unspecified: Secondary | ICD-10-CM

## 2023-04-11 DIAGNOSIS — N184 Chronic kidney disease, stage 4 (severe): Secondary | ICD-10-CM | POA: Insufficient documentation

## 2023-04-11 DIAGNOSIS — I6782 Cerebral ischemia: Secondary | ICD-10-CM | POA: Insufficient documentation

## 2023-04-11 LAB — CBC WITH DIFFERENTIAL/PLATELET
Abs Immature Granulocytes: 0.01 10*3/uL (ref 0.00–0.07)
Basophils Absolute: 0 10*3/uL (ref 0.0–0.1)
Basophils Relative: 0 %
Eosinophils Absolute: 0.9 10*3/uL — ABNORMAL HIGH (ref 0.0–0.5)
Eosinophils Relative: 14 %
HCT: 30.4 % — ABNORMAL LOW (ref 36.0–46.0)
Hemoglobin: 9.4 g/dL — ABNORMAL LOW (ref 12.0–15.0)
Immature Granulocytes: 0 %
Lymphocytes Relative: 17 %
Lymphs Abs: 1.1 10*3/uL (ref 0.7–4.0)
MCH: 28.8 pg (ref 26.0–34.0)
MCHC: 30.9 g/dL (ref 30.0–36.0)
MCV: 93.3 fL (ref 80.0–100.0)
Monocytes Absolute: 0.4 10*3/uL (ref 0.1–1.0)
Monocytes Relative: 7 %
Neutro Abs: 4 10*3/uL (ref 1.7–7.7)
Neutrophils Relative %: 62 %
Platelets: 199 10*3/uL (ref 150–400)
RBC: 3.26 MIL/uL — ABNORMAL LOW (ref 3.87–5.11)
RDW: 16.4 % — ABNORMAL HIGH (ref 11.5–15.5)
WBC: 6.3 10*3/uL (ref 4.0–10.5)
nRBC: 0 % (ref 0.0–0.2)

## 2023-04-11 LAB — VITAMIN B12: Vitamin B-12: 403 pg/mL (ref 180–914)

## 2023-04-11 MED ORDER — CYANOCOBALAMIN 1000 MCG/ML IJ SOLN
1000.0000 ug | Freq: Once | INTRAMUSCULAR | Status: AC
  Administered 2023-04-11: 1000 ug via INTRAMUSCULAR
  Filled 2023-04-11: qty 1

## 2023-04-11 MED ORDER — DARBEPOETIN ALFA 200 MCG/0.4ML IJ SOSY
200.0000 ug | PREFILLED_SYRINGE | Freq: Once | INTRAMUSCULAR | Status: AC
  Administered 2023-04-11: 200 ug via SUBCUTANEOUS
  Filled 2023-04-11: qty 0.4

## 2023-04-11 NOTE — Assessment & Plan Note (Signed)
She has slight ulceration of her skin near the left nipple She was evaluated by general surgery 10 years ago due to high risk breast cancer history I will refer her back to general surgery for evaluation

## 2023-04-11 NOTE — Progress Notes (Signed)
[facility] OFFICE PROGRESS NOTE  Koirala, Dibas, MD  ASSESSMENT & PLAN:  Vitamin B 12 deficiency She has intermittent B12 deficiency She will receive B12 injection today along with darbepoetin injection Repeat B12 level is pending  Anemia in chronic kidney disease I plan to see her back in 2 months for further follow-up We will continue ESA injection  Family history of breast cancer She has slight ulceration of her skin near the left nipple She was evaluated by general surgery 10 years ago due to high risk breast cancer history I will refer her back to general surgery for evaluation  Orders Placed This Encounter  Procedures   CBC with Differential/Platelet    Standing Status:   Standing    Number of Occurrences:   22    Expiration Date:   04/10/2024   Ambulatory referral to General Surgery    Referral Priority:   Routine    Referral Type:   Surgical    Referral Reason:   Specialty Services Required    Referred to Provider:   Emelia Loron, MD    Requested Specialty:   General Surgery    Number of Visits Requested:   1    The total time spent in the appointment was 30 minutes encounter with patients including review of chart and various tests results, discussions about plan of care and coordination of care plan   All questions were answered. The patient knows to call the clinic with any problems, questions or concerns. No barriers to learning was detected.    Artis Delay, MD 1/17/202512:39 PM  INTERVAL HISTORY: Abigail Hoover 78 y.o. female returns for follow-up for B12 deficiency anemia and anemia chronic kidney disease receiving darbepoetin injection She is concerned about changes to her left nipple She is not symptomatic from anemia I reviewed her CBC result and discussed future follow-up  SUMMARY OF HEMATOLOGIC HISTORY:  This is a patient that has been followed here since 2011 for chronic anemia In 2011, she had a bone marrow aspirate and  biopsy that came back normal. The patient has been receiving Aranesp injection for chronic anemia up until 2014. It was subsequently discontinued due stability of the anemia. Starting 05/27/2014, darbepoetin was resumed at 200 g every other month to keep hemoglobin greater than 10 g. Treatment was discontinued in October 2016 and resumed in September 2017 The patient is subsequently found to have severe vitamin B12 deficiency and is started on vitamin B12 injection on December 25, 2016 along with resumption of darbepoetin injection every 5 to 6 months.  She is also seen due to family history of breast cancer She also required intravenous iron infusion intermittently for concurrent iron deficiency anemia  I have reviewed the past medical history, past surgical history, social history and family history with the patient and they are unchanged from previous note.  ALLERGIES:  is allergic to atorvastatin and ezetimibe.  MEDICATIONS:  Current Outpatient Medications  Medication Sig Dispense Refill   allopurinol (ZYLOPRIM) 100 MG tablet Take 1 tablet by mouth daily.     aspirin EC 81 MG tablet Take 81 mg by mouth daily. Take 1 tablet by mouth once daily.     colchicine 0.6 MG tablet TAKE 1/2 TABLET (0.3 MG TOTAL) BY MOUTH DAILY. 15 tablet 2   Continuous Blood Gluc Sensor (DEXCOM G7 SENSOR) MISC Inject 1 sensor to the skin every 10 days for continuous glucose monitoring.     Cyanocobalamin (VITAMIN B-12 IJ) Inject as directed  every 3 (three) months.     Darbepoetin Alfa (ARANESP) 60 MCG/0.3ML SOSY injection Inject 60 mcg into the skin.     denosumab (PROLIA) 60 MG/ML SOSY injection Inject 60 mg into the skin every 6 (six) months. Courier to rheum: [address], [place] [address]. Appt 03/03/23 1 mL 0   docusate sodium (COLACE) 100 MG capsule Take 400 mg by mouth daily.     famotidine (PEPCID) 40 MG tablet Take 40 mg by mouth 2 (two) times daily.     gabapentin (NEURONTIN) 300 MG capsule  Take 300 mg by mouth 3 (three) times daily.     glucose blood (ONETOUCH VERIO) test strip Use Onetouch verio test strips to check blood sugar three times daily. (90 day RX) 300 each 2   hydrocortisone 2.5 % ointment      Insulin Glargine (BASAGLAR KWIKPEN) 100 UNIT/ML Inject 2 Units into the skin daily.     Insulin Pen Needle (PEN NEEDLES 3/16") 31G X 5 MM MISC Use 1 pen needle to inject Forteo daily. 100 each 2   Insulin Pen Needle 31G X 5 MM MISC Use with Novolog 100 each 1   Insulin Syringe-Needle U-100 25G X 5/8" 1 ML MISC USE DAILY TO INJECT INSULIN 100 each 3   isosorbide mononitrate (IMDUR) 120 MG 24 hr tablet Take 1 tablet by mouth every morning.     Lancet Devices (ONE TOUCH DELICA LANCING DEV) MISC 1 each by Does not apply route daily. Use as instructed to check blood sugar once daily. 1 each 0   Lancets (ONETOUCH DELICA PLUS LANCET33G) MISC 4 (four) times daily.     metoprolol succinate (TOPROL-XL) 50 MG 24 hr tablet Take 1 tablet (50 mg total) by mouth daily. Take with or immediately following a meal. 30 tablet 3   mirabegron ER (MYRBETRIQ) 50 MG TB24 tablet Take by mouth.     nitroGLYCERIN (NITROSTAT) 0.4 MG SL tablet PLACE 1 TAB UNDER TONGUE FOR CHEST PAIN EVERY 5 MINUTES     MAXIMUM OF 3 DOSES     CALL 911 AFTER FIRST DOSE. 25 tablet 0   NOVOLOG FLEXPEN 100 UNIT/ML FlexPen INJECT 9-11 UNITS UNDER THE SKIN BEFORE the 3 meals (Patient taking differently: 6-7 Units. [id number] UNITS UNDER THE SKIN BEFORE the 3 meals) 30 mL 3   pantoprazole (PROTONIX) 40 MG tablet Take 40 mg by mouth every morning.     pentosan polysulfate (ELMIRON) 100 MG capsule TAKE 1 CAPSULE (100 MG TOTAL) BY MOUTH 3 TIMES DAILY BEFORE MEALS.     polyethylene glycol (MIRALAX / GLYCOLAX) 17 g packet as needed.     pravastatin (PRAVACHOL) 20 MG tablet TAKE ONE TABLET BY MOUTH ONCE DAILY 15 tablet 0   sitaGLIPtin (JANUVIA) 25 MG tablet Take 25 mg by mouth daily.     sucralfate (CARAFATE) 1 g tablet 1 tab(s) crush and  mix in water 4 times a day (between meals and at bedtime) for 90 days     SYNTHROID 75 MCG tablet TAKE ONE TABLET BY MOUTH ONCE DAILY (Patient taking differently: Take 75 mcg by mouth. 6 days and 1/2 tablet on Sunday) 90 tablet 1   torsemide (DEMADEX) 20 MG tablet Take 1 tablet by mouth daily.     triamcinolone (KENALOG) 0.025 % ointment Apply 1 application  topically 2 (two) times daily.     No current facility-administered medications for this visit.   Facility-Administered Medications Ordered in Other Visits  Medication Dose Route  Frequency Provider Last Rate Last Admin   cyanocobalamin (VITAMIN B12) injection 1,000 mcg  1,000 mcg Intramuscular Once Bertis Ruddy, Brittainy Bucker, MD       Darbepoetin Alfa (ARANESP) injection 200 mcg  200 mcg Subcutaneous Once Bertis Ruddy, Finlay Mills, MD         REVIEW OF SYSTEMS:   Constitutional: Denies fevers, chills or night sweats Eyes: Denies blurriness of vision Ears, nose, mouth, throat, and face: Denies mucositis or sore throat Respiratory: Denies cough, dyspnea or wheezes Cardiovascular: Denies palpitation, chest discomfort or lower extremity swelling Gastrointestinal:  Denies nausea, heartburn or change in bowel habits Skin: Denies abnormal skin rashes Lymphatics: Denies new lymphadenopathy or easy bruising Neurological:Denies numbness, tingling or new weaknesses Behavioral/Psych: Mood is stable, no new changes  All other systems were reviewed with the patient and are negative.  PHYSICAL EXAMINATION: ECOG PERFORMANCE STATUS: 1 - Symptomatic but completely ambulatory  Vitals:   04/11/23 1212  BP: 130/60  Pulse: 62  Resp: 18  Temp: 98.6 F (37 C)  SpO2: 100%   Filed Weights   04/11/23 1212  Weight: 130 lb 9.6 oz (59.2 kg)    GENERAL:alert, no distress and comfortable SKIN: Noted skin changes near the left nipple with ulceration of the skin NEURO: alert & oriented x 3 with fluent speech, no focal motor/sensory deficits  LABORATORY DATA:  I have  reviewed the data as listed     Component Value Date/Time   NA 127 (L) 02/26/2023 0257   NA 140 01/18/2022 1150   NA 138 12/12/2016 1150   K 3.8 02/26/2023 0257   K 4.6 12/12/2016 1150   CL 86 (L) 02/26/2023 0257   CL 103 07/27/2012 1259   CO2 31 02/26/2023 0257   CO2 31 (H) 12/12/2016 1150   GLUCOSE 142 (H) 02/26/2023 0257   GLUCOSE 149 (H) 12/12/2016 1150   GLUCOSE 118 (H) 07/27/2012 1259   BUN 25 (H) 02/26/2023 0257   BUN 28 (H) 01/18/2022 1150   BUN 23.4 12/12/2016 1150   CREATININE 1.91 (H) 02/26/2023 0257   CREATININE 2.07 (H) 02/13/2023 1119   CREATININE 2.31 (H) 07/23/2022 1413   CREATININE 1.7 (H) 12/12/2016 1150   CALCIUM 10.1 02/26/2023 0257   CALCIUM 11.4 (H) 12/12/2016 1150   PROT 6.0 (L) 02/13/2023 1119   PROT 6.5 12/12/2016 1150   ALBUMIN 3.9 02/13/2023 1119   ALBUMIN 3.9 12/12/2016 1150   AST 18 02/13/2023 1119   AST 19 12/12/2016 1150   ALT 12 02/13/2023 1119   ALT 10 12/12/2016 1150   ALKPHOS 59 02/13/2023 1119   ALKPHOS 65 12/12/2016 1150   BILITOT 0.4 02/13/2023 1119   BILITOT 0.36 12/12/2016 1150   GFRNONAA 26 (L) 02/26/2023 0257   GFRNONAA 24 (L) 02/13/2023 1119   GFRNONAA 21 (L) 09/20/2019 1506   GFRAA 25 (L) 09/20/2019 1506    No results found for: "SPEP", "UPEP"  Lab Results  Component Value Date   WBC 6.3 04/11/2023   NEUTROABS 4.0 04/11/2023   HGB 9.4 (L) 04/11/2023   HCT 30.4 (L) 04/11/2023   MCV 93.3 04/11/2023   PLT 199 04/11/2023      Chemistry      Component Value Date/Time   NA 127 (L) 02/26/2023 0257   NA 140 01/18/2022 1150   NA 138 12/12/2016 1150   K 3.8 02/26/2023 0257   K 4.6 12/12/2016 1150   CL 86 (L) 02/26/2023 0257   CL 103 07/27/2012 1259   CO2 31 02/26/2023 0257   CO2  31 (H) 12/12/2016 1150   BUN 25 (H) 02/26/2023 0257   BUN 28 (H) 01/18/2022 1150   BUN 23.4 12/12/2016 1150   CREATININE 1.91 (H) 02/26/2023 0257   CREATININE 2.07 (H) 02/13/2023 1119   CREATININE 2.31 (H) 07/23/2022 1413   CREATININE  1.7 (H) 12/12/2016 1150      Component Value Date/Time   CALCIUM 10.1 02/26/2023 0257   CALCIUM 11.4 (H) 12/12/2016 1150   ALKPHOS 59 02/13/2023 1119   ALKPHOS 65 12/12/2016 1150   AST 18 02/13/2023 1119   AST 19 12/12/2016 1150   ALT 12 02/13/2023 1119   ALT 10 12/12/2016 1150   BILITOT 0.4 02/13/2023 1119   BILITOT 0.36 12/12/2016 1150       RADIOGRAPHIC STUDIES: I have personally reviewed the radiological images as listed and agreed with the findings in the report. DG Hip Unilat W or Wo Pelvis 2-3 Views Left Result Date: 04/09/2023 CLINICAL DATA:  Hip pain post fall EXAM: DG HIP (WITH OR WITHOUT PELVIS) 2-3V LEFT COMPARISON:  03/02/2016 FINDINGS: SI joints are patent. Pubic symphysis and rami appear intact. Mild to moderate right hip degenerative change. Left hip replacement with intact hardware and normal alignment. Heterotopic ossification about the left hip. Focal cortical thickening lateral shaft of the proximal right femur IMPRESSION: 1. Left hip replacement with intact hardware and normal alignment. No acute left hip fracture 2. Focal cortical bone thickening proximal shaft of right femur with possible faintly visible horizontal lucency, question stress fracture Electronically Signed   By: Jasmine Pang M.D.   On: 04/09/2023 22:53   CT Head Wo Contrast Result Date: 04/09/2023 CLINICAL DATA:  Headache mechanical fall EXAM: CT HEAD WITHOUT CONTRAST CT CERVICAL SPINE WITHOUT CONTRAST TECHNIQUE: Multidetector CT imaging of the head and cervical spine was performed following the standard protocol without intravenous contrast. Multiplanar CT image reconstructions of the cervical spine were also generated. RADIATION DOSE REDUCTION: This exam was performed according to the departmental dose-optimization program which includes automated exposure control, adjustment of the mA and/or kV according to patient size and/or use of iterative reconstruction technique. COMPARISON:  CT brain 02/26/2023,  MRI 06/08/2015 FINDINGS: CT HEAD FINDINGS Brain: No acute territorial infarction, hemorrhage or intracranial mass. Atrophy and mild chronic small vessel ischemic changes of the white matter. Small chronic left cerebellar infarct. Stable ventricle size Vascular: No hyperdense vessels.  Carotid vascular calcification Skull: Normal. Negative for fracture or focal lesion. Sinuses/Orbits: No acute finding. Other: None CT CERVICAL SPINE FINDINGS Alignment: No subluxation.  Facet alignment within normal limits Skull base and vertebrae: No acute fracture. No primary bone lesion or focal pathologic process. Soft tissues and spinal canal: No prevertebral fluid or swelling. No visible canal hematoma. Disc levels: Anterior fusion hardware C4 through C7. Moderate degenerative changes at C2-C3, C3-C4 and C7-T1. Multilevel foraminal narrowing. Upper chest: Negative. Other: None IMPRESSION: 1. No CT evidence for acute intracranial abnormality. Atrophy and chronic small vessel ischemic changes of the white matter. Small chronic left cerebellar infarct. 2. Anterior fusion hardware C4 through C7. No acute osseous abnormality. Electronically Signed   By: Jasmine Pang M.D.   On: 04/09/2023 21:17   CT Cervical Spine Wo Contrast Result Date: 04/09/2023 CLINICAL DATA:  Headache mechanical fall EXAM: CT HEAD WITHOUT CONTRAST CT CERVICAL SPINE WITHOUT CONTRAST TECHNIQUE: Multidetector CT imaging of the head and cervical spine was performed following the standard protocol without intravenous contrast. Multiplanar CT image reconstructions of the cervical spine were also generated. RADIATION DOSE REDUCTION: This exam  was performed according to the departmental dose-optimization program which includes automated exposure control, adjustment of the mA and/or kV according to patient size and/or use of iterative reconstruction technique. COMPARISON:  CT brain 02/26/2023, MRI 06/08/2015 FINDINGS: CT HEAD FINDINGS Brain: No acute territorial  infarction, hemorrhage or intracranial mass. Atrophy and mild chronic small vessel ischemic changes of the white matter. Small chronic left cerebellar infarct. Stable ventricle size Vascular: No hyperdense vessels.  Carotid vascular calcification Skull: Normal. Negative for fracture or focal lesion. Sinuses/Orbits: No acute finding. Other: None CT CERVICAL SPINE FINDINGS Alignment: No subluxation.  Facet alignment within normal limits Skull base and vertebrae: No acute fracture. No primary bone lesion or focal pathologic process. Soft tissues and spinal canal: No prevertebral fluid or swelling. No visible canal hematoma. Disc levels: Anterior fusion hardware C4 through C7. Moderate degenerative changes at C2-C3, C3-C4 and C7-T1. Multilevel foraminal narrowing. Upper chest: Negative. Other: None IMPRESSION: 1. No CT evidence for acute intracranial abnormality. Atrophy and chronic small vessel ischemic changes of the white matter. Small chronic left cerebellar infarct. 2. Anterior fusion hardware C4 through C7. No acute osseous abnormality. Electronically Signed   By: Jasmine Pang M.D.   On: 04/09/2023 21:17

## 2023-04-11 NOTE — Assessment & Plan Note (Signed)
She has intermittent B12 deficiency She will receive B12 injection today along with darbepoetin injection Repeat B12 level is pending

## 2023-04-11 NOTE — Assessment & Plan Note (Signed)
 I plan to see her back in 2 months for further follow-up We will continue ESA injection

## 2023-04-15 NOTE — Progress Notes (Signed)
Faxed referral to [facility] [place] [facility] to Dr. Dwain Sarna at [phone], received fax confirmation. Returned call to mobile # listed and spoke with daughter. Told her referral sent. She verbalized understanding.

## 2023-04-16 ENCOUNTER — Telehealth: Payer: Self-pay | Admitting: Cardiology

## 2023-04-16 NOTE — Telephone Encounter (Signed)
Spoke with the patient's daughter who states that the patient had a fall recently. She has undergone a lot of testing due to hitting her heart and injuring her back during the fall. The daughter would like to know if it is safe for the patient to undergo a PET/CT stress test at this time with all of the other testing that she has had done or if she should wait a while. Daughter would like to know Dr. Damian Leavell opinion.

## 2023-04-16 NOTE — Telephone Encounter (Signed)
Daughter was calling to speak to the nurse about upcoming CT. Please advise

## 2023-04-17 DIAGNOSIS — E1122 Type 2 diabetes mellitus with diabetic chronic kidney disease: Secondary | ICD-10-CM | POA: Diagnosis not present

## 2023-04-17 DIAGNOSIS — Z8673 Personal history of transient ischemic attack (TIA), and cerebral infarction without residual deficits: Secondary | ICD-10-CM | POA: Diagnosis not present

## 2023-04-17 DIAGNOSIS — R519 Headache, unspecified: Secondary | ICD-10-CM | POA: Diagnosis not present

## 2023-04-17 DIAGNOSIS — R413 Other amnesia: Secondary | ICD-10-CM | POA: Diagnosis not present

## 2023-04-17 NOTE — Telephone Encounter (Signed)
Was supposed to say hitting her head

## 2023-04-17 NOTE — Telephone Encounter (Signed)
?  hitting her heart? Can wait until patient feels comfortable. Her chest pain symptoms have been chronic.  Thanks MJP

## 2023-04-17 NOTE — Telephone Encounter (Signed)
Spoke with the patient's daughter and advised on recommendations from Dr. Rosemary Holms. She will talk with the patient and call us back if she decides to reschedule.

## 2023-04-22 ENCOUNTER — Telehealth (HOSPITAL_COMMUNITY): Payer: Self-pay | Admitting: *Deleted

## 2023-04-22 DIAGNOSIS — M25562 Pain in left knee: Secondary | ICD-10-CM | POA: Diagnosis not present

## 2023-04-22 DIAGNOSIS — M170 Bilateral primary osteoarthritis of knee: Secondary | ICD-10-CM | POA: Diagnosis not present

## 2023-04-22 DIAGNOSIS — Z96642 Presence of left artificial hip joint: Secondary | ICD-10-CM | POA: Diagnosis not present

## 2023-04-22 NOTE — Telephone Encounter (Signed)
Reaching out to patient to offer assistance regarding upcoming cardiac imaging study; pt's daughter answered the phone verbalizes understanding of appt date/time. However, the patient has a recent fall and cannot go through with the study. Additionally the daughter would like for the patient to get an MRI first.  Patient's appointment canceled and daughter is aware that she will be contacted to reschedule.  Larey Brick RN Navigator Cardiac Imaging [facility] Heart and Vascular [phone] office [phone] cell

## 2023-04-23 ENCOUNTER — Ambulatory Visit (HOSPITAL_COMMUNITY): Admission: RE | Admit: 2023-04-23 | Payer: Medicare Other | Source: Ambulatory Visit

## 2023-04-25 ENCOUNTER — Telehealth: Payer: Self-pay

## 2023-04-25 NOTE — Progress Notes (Deleted)
 Office Visit Note  Patient: Abigail Hoover             Date of Birth: [birth year]           MRN: [id number]             PCP: Darrow Bussing, MD Referring: Abigail Savoy, MD Visit Date: 05/08/2023 Occupation: @GUAROCC @  Subjective:  No chief complaint on file.   History of Present Illness: Abigail Hoover is a 78 y.o. female ***     Activities of Daily Living:  Patient reports morning stiffness for *** {minute/hour:19697}.   Patient {ACTIONS;DENIES/REPORTS:21021675::"Denies"} nocturnal pain.  Difficulty dressing/grooming: {ACTIONS;DENIES/REPORTS:21021675::"Denies"} Difficulty climbing stairs: {ACTIONS;DENIES/REPORTS:21021675::"Denies"} Difficulty getting out of chair: {ACTIONS;DENIES/REPORTS:21021675::"Denies"} Difficulty using hands for taps, buttons, cutlery, and/or writing: {ACTIONS;DENIES/REPORTS:21021675::"Denies"}  No Rheumatology ROS completed.   PMFS History:  Patient Active Problem List   Diagnosis Date Noted   Leg edema 07/29/2022   Chronic idiopathic constipation 07/02/2022   Constipation 07/02/2022   Right lower quadrant pain 07/02/2022   Osteoarthritis of knee 03/06/2022   Preventive measure 01/31/2022   Retrosternal chest pain 01/16/2022   Stable angina (HCC) 01/16/2022   Chest pain on breathing 12/07/2021   Inflammation of joint of both hands 10/26/2020   DDD (degenerative disc disease), lumbar 08/26/2019   Age-related osteoporosis without current pathological fracture 08/26/2019   Primary osteoarthritis of both knees 08/26/2019   Primary osteoarthritis of both hands 08/26/2019   Diabetes mellitus with chronic kidney disease, with long-term current use of insulin (HCC) 05/11/2019   Pain due to onychomycosis of toenails of both feet 09/30/2018   Trigger thumb of right hand 09/02/2018   Chronic pain of right thumb 08/27/2018   Pain in finger of right hand 06/01/2018   Plantar fasciitis of left foot 05/19/2018   Lower limb pain, inferior, left  04/28/2018   Dry skin 03/12/2018   Neck pain 01/27/2018   Other fatigue 09/18/2017   Pain and swelling of right wrist 08/06/2017   At high risk for breast cancer 03/06/2017   Hypercalcemia 12/16/2016   Vitamin B 12 deficiency 12/16/2016   Displaced fracture of left femoral neck (HCC) 03/02/2016   Hyperkalemia 03/01/2016   Femoral neck fracture (HCC) 02/29/2016   Throat burning 01/23/2016   Anemia due to stage 3 (moderate) chronic renal failure treated with erythropoietin (HCC) 12/18/2015   Chronic kidney disease, stage III (moderate) (HCC) 08/14/2015   Anemia in chronic kidney disease 05/01/2015   Acute coronary syndrome (HCC) 01/02/2015   Lumbar spondylosis with myelopathy 06/27/2014   Deficiency anemia 05/27/2014   Carpal tunnel syndrome 12/09/2013   Diabetic neuropathy (HCC) 12/09/2013   Diabetic polyneuropathy associated with type 2 diabetes mellitus (HCC) 12/09/2013   Type 2 diabetes mellitus with stage 4 chronic kidney disease, with long-term current use of insulin (HCC) 04/21/2013   Disturbance of skin sensation 04/21/2013   Hypothyroidism 10/30/2012   Breast mass in female-left 03/10/2012   Weight loss 11/28/2011   Increased frequency of urination 10/16/2011   Urinary urgency 10/16/2011   Pelvic pain in female 10/16/2011   Family history of breast cancer 08/01/2010   Essential hypertension 02/01/2009   EDEMA LEGS 02/01/2009    Past Medical History:  Diagnosis Date   Anemia in chronic kidney disease 05/01/2015   Anxiety    Arthritis    Bladder irritation    Diabetes mellitus    GERD (gastroesophageal reflux disease)    Hypercholesterolemia    Hypertension    Hypothyroidism    Iron deficiency anemia,  unspecified    Psoriasis    Transfusion history 03/05/2016   for postoperative (ORIF) anemia superimposed on chronic anemia    Family History  Problem Relation Age of Onset   Heart disease Mother        heart attack   Stroke Father        brain hemorrhage    Diabetes Brother    Breast cancer Sister    Cancer Sister        breast cancer   Past Surgical History:  Procedure Laterality Date   ABDOMINAL HYSTERECTOMY     TAH   BREAST BIOPSY     Left breast biopsy benign lesion   BREAST BIOPSY  03/11/2012   Procedure: BREAST BIOPSY;  Surgeon: Adolph Pollack, MD;  Location: [facility] OR;  Service: General;  Laterality: Left;  remove left breast mass   BREAST EXCISIONAL BIOPSY Left    CARDIAC CATHETERIZATION N/A 01/02/2015   Procedure: Left Heart Cath and Coronary Angiography;  Surgeon: Rinaldo Cloud, MD;  Location: [facility] INVASIVE CV LAB;  Service: Cardiovascular;  Laterality: N/A;   CATARACT EXTRACTION     CERVICAL DISC SURGERY     COLONOSCOPY  2013   neg. next 2023.    EYE SURGERY     IR KYPHO EA ADDL LEVEL THORACIC OR LUMBAR  09/09/2019   IR KYPHO LUMBAR INC FX REDUCE BONE BX UNI/BIL CANNULATION INC/IMAGING  09/09/2019   IR RADIOLOGIST EVAL & MGMT  09/08/2019   LEFT HIP SURGERY     TOTAL HIP ARTHROPLASTY Left 03/02/2016   Procedure: TOTAL HIP ARTHROPLASTY ANTERIOR APPROACH;  Surgeon: Samson Frederic, MD;  Location: [facility] OR;  Service: Orthopedics;  Laterality: Left;   Social History   Social History Narrative   Patient is married with one child.   Patient is right handed.   Patient has college education.   Patient drinks tea, two times a day   Immunization History  Administered Date(s) Administered   Fluad Quad(high Dose 65+) 12/22/2018, 01/31/2022   Influenza Split 11/13/2010   Influenza, High Dose Seasonal PF 01/25/2016, 12/24/2016, 01/03/2018   Influenza,inj,Quad PF,6+ Mos 12/29/2012, 12/20/2013, 01/16/2015   Influenza,inj,quad, With Preservative 01/03/2018, 12/24/2018   Pneumococcal Conjugate-13 01/16/2015   Pneumococcal Polysaccharide-23 03/24/2013     Objective: Vital Signs: There were no vitals taken for this visit.   Physical Exam   Musculoskeletal Exam: ***  CDAI Exam: CDAI Score: -- Patient Global: --; Provider Global:  -- Swollen: --; Tender: -- Joint Exam 05/08/2023   No joint exam has been documented for this visit   There is currently no information documented on the homunculus. Go to the Rheumatology activity and complete the homunculus joint exam.  Investigation: No additional findings.  Imaging: DG Hip Unilat W or Wo Pelvis 2-3 Views Left Result Date: 04/09/2023 CLINICAL DATA:  Hip pain post fall EXAM: DG HIP (WITH OR WITHOUT PELVIS) 2-3V LEFT COMPARISON:  03/02/2016 FINDINGS: SI joints are patent. Pubic symphysis and rami appear intact. Mild to moderate right hip degenerative change. Left hip replacement with intact hardware and normal alignment. Heterotopic ossification about the left hip. Focal cortical thickening lateral shaft of the proximal right femur IMPRESSION: 1. Left hip replacement with intact hardware and normal alignment. No acute left hip fracture 2. Focal cortical bone thickening proximal shaft of right femur with possible faintly visible horizontal lucency, question stress fracture Electronically Signed   By: Jasmine Pang M.D.   On: 04/09/2023 22:53   CT Head Wo Contrast Result Date: 04/09/2023  CLINICAL DATA:  Headache mechanical fall EXAM: CT HEAD WITHOUT CONTRAST CT CERVICAL SPINE WITHOUT CONTRAST TECHNIQUE: Multidetector CT imaging of the head and cervical spine was performed following the standard protocol without intravenous contrast. Multiplanar CT image reconstructions of the cervical spine were also generated. RADIATION DOSE REDUCTION: This exam was performed according to the departmental dose-optimization program which includes automated exposure control, adjustment of the mA and/or kV according to patient size and/or use of iterative reconstruction technique. COMPARISON:  CT brain 02/26/2023, MRI 06/08/2015 FINDINGS: CT HEAD FINDINGS Brain: No acute territorial infarction, hemorrhage or intracranial mass. Atrophy and mild chronic small vessel ischemic changes of the white matter.  Small chronic left cerebellar infarct. Stable ventricle size Vascular: No hyperdense vessels.  Carotid vascular calcification Skull: Normal. Negative for fracture or focal lesion. Sinuses/Orbits: No acute finding. Other: None CT CERVICAL SPINE FINDINGS Alignment: No subluxation.  Facet alignment within normal limits Skull base and vertebrae: No acute fracture. No primary bone lesion or focal pathologic process. Soft tissues and spinal canal: No prevertebral fluid or swelling. No visible canal hematoma. Disc levels: Anterior fusion hardware C4 through C7. Moderate degenerative changes at C2-C3, C3-C4 and C7-T1. Multilevel foraminal narrowing. Upper chest: Negative. Other: None IMPRESSION: 1. No CT evidence for acute intracranial abnormality. Atrophy and chronic small vessel ischemic changes of the white matter. Small chronic left cerebellar infarct. 2. Anterior fusion hardware C4 through C7. No acute osseous abnormality. Electronically Signed   By: Jasmine Pang M.D.   On: 04/09/2023 21:17   CT Cervical Spine Wo Contrast Result Date: 04/09/2023 CLINICAL DATA:  Headache mechanical fall EXAM: CT HEAD WITHOUT CONTRAST CT CERVICAL SPINE WITHOUT CONTRAST TECHNIQUE: Multidetector CT imaging of the head and cervical spine was performed following the standard protocol without intravenous contrast. Multiplanar CT image reconstructions of the cervical spine were also generated. RADIATION DOSE REDUCTION: This exam was performed according to the departmental dose-optimization program which includes automated exposure control, adjustment of the mA and/or kV according to patient size and/or use of iterative reconstruction technique. COMPARISON:  CT brain 02/26/2023, MRI 06/08/2015 FINDINGS: CT HEAD FINDINGS Brain: No acute territorial infarction, hemorrhage or intracranial mass. Atrophy and mild chronic small vessel ischemic changes of the white matter. Small chronic left cerebellar infarct. Stable ventricle size Vascular: No  hyperdense vessels.  Carotid vascular calcification Skull: Normal. Negative for fracture or focal lesion. Sinuses/Orbits: No acute finding. Other: None CT CERVICAL SPINE FINDINGS Alignment: No subluxation.  Facet alignment within normal limits Skull base and vertebrae: No acute fracture. No primary bone lesion or focal pathologic process. Soft tissues and spinal canal: No prevertebral fluid or swelling. No visible canal hematoma. Disc levels: Anterior fusion hardware C4 through C7. Moderate degenerative changes at C2-C3, C3-C4 and C7-T1. Multilevel foraminal narrowing. Upper chest: Negative. Other: None IMPRESSION: 1. No CT evidence for acute intracranial abnormality. Atrophy and chronic small vessel ischemic changes of the white matter. Small chronic left cerebellar infarct. 2. Anterior fusion hardware C4 through C7. No acute osseous abnormality. Electronically Signed   By: Jasmine Pang M.D.   On: 04/09/2023 21:17    Recent Labs: Lab Results  Component Value Date   WBC 6.3 04/11/2023   HGB 9.4 (L) 04/11/2023   PLT 199 04/11/2023   NA 127 (L) 02/26/2023   K 3.8 02/26/2023   CL 86 (L) 02/26/2023   CO2 31 02/26/2023   GLUCOSE 142 (H) 02/26/2023   BUN 25 (H) 02/26/2023   CREATININE 1.91 (H) 02/26/2023   BILITOT 0.4 02/13/2023  ALKPHOS 59 02/13/2023   AST 18 02/13/2023   ALT 12 02/13/2023   PROT 6.0 (L) 02/13/2023   ALBUMIN 3.9 02/13/2023   CALCIUM 10.1 02/26/2023   GFRAA 25 (L) 09/20/2019    Speciality Comments: Forteo started 09/29/2019 Prolia started 07/05/21  Procedures:  No procedures performed Allergies: Atorvastatin and Ezetimibe   Assessment / Plan:     Visit Diagnoses: No diagnosis found.  Orders: No orders of the defined types were placed in this encounter.  No orders of the defined types were placed in this encounter.   Face-to-face time spent with patient was *** minutes. Greater than 50% of time was spent in counseling and coordination of care.  Follow-Up  Instructions: No follow-ups on file.   Ellen Henri, CMA  Note - This record has been created using Animal nutritionist.  Chart creation errors have been sought, but may not always  have been located. Such creation errors do not reflect on  the standard of medical care.

## 2023-04-25 NOTE — Telephone Encounter (Signed)
Returned call to daughter. She is asking what injections her Mom gets and dates of injections. Her Mom has a upcoming MRI and she is concerned. Told her Dr. Bertis Ruddy said that her Mom is fine to have MRI. Daughter verbalized understanding.

## 2023-04-28 DIAGNOSIS — N184 Chronic kidney disease, stage 4 (severe): Secondary | ICD-10-CM | POA: Diagnosis not present

## 2023-04-29 ENCOUNTER — Ambulatory Visit: Payer: Medicare Other

## 2023-04-29 ENCOUNTER — Encounter: Payer: Self-pay | Admitting: Orthopaedic Surgery

## 2023-04-29 ENCOUNTER — Ambulatory Visit
Admission: RE | Admit: 2023-04-29 | Discharge: 2023-04-29 | Disposition: A | Discharge status: Home / Self Care | Payer: Medicare Other | Source: Ambulatory Visit | Attending: Family Medicine | Admitting: Family Medicine

## 2023-04-29 DIAGNOSIS — N6342 Unspecified lump in left breast, subareolar: Secondary | ICD-10-CM

## 2023-04-29 DIAGNOSIS — N6459 Other signs and symptoms in breast: Secondary | ICD-10-CM | POA: Diagnosis not present

## 2023-05-01 LAB — LAB REPORT - SCANNED: EGFR: 26

## 2023-05-02 ENCOUNTER — Telehealth: Payer: Self-pay

## 2023-05-02 NOTE — Telephone Encounter (Signed)
 Returned daughters call. Her Mom had mammogram on 2/4 and it was negative. She is asking if they still need to keep appt with Dr. Delane Fear that is scheduled later next week.

## 2023-05-03 DIAGNOSIS — E1122 Type 2 diabetes mellitus with diabetic chronic kidney disease: Secondary | ICD-10-CM | POA: Diagnosis not present

## 2023-05-05 ENCOUNTER — Encounter: Payer: Self-pay | Admitting: Cardiology

## 2023-05-05 ENCOUNTER — Encounter: Payer: Self-pay | Admitting: Hematology and Oncology

## 2023-05-05 ENCOUNTER — Ambulatory Visit: Payer: Medicare Other | Attending: Cardiology | Admitting: Cardiology

## 2023-05-05 VITALS — BP 118/76 | HR 85 | Resp 16 | Ht 61.0 in | Wt 136.0 lb

## 2023-05-05 DIAGNOSIS — I10 Essential (primary) hypertension: Secondary | ICD-10-CM | POA: Diagnosis not present

## 2023-05-05 DIAGNOSIS — I2510 Atherosclerotic heart disease of native coronary artery without angina pectoris: Secondary | ICD-10-CM | POA: Insufficient documentation

## 2023-05-05 DIAGNOSIS — R072 Precordial pain: Secondary | ICD-10-CM | POA: Diagnosis not present

## 2023-05-05 NOTE — Patient Instructions (Signed)
 Testing/Procedures:  Please keep cardiac PET/CT    Follow-Up: At [facility], you and your health needs are our priority.  As part of our continuing mission to provide you with exceptional heart care, we have created designated Provider Care Teams.  These Care Teams include your primary Cardiologist (physician) and Advanced Practice Providers (APPs -  Physician Assistants and Nurse Practitioners) who all work together to provide you with the care you need, when you need it.   Your next appointment:   3 month(s)  Provider:   Cody Das, MD     Other Instructions   1st Floor: - Lobby - Registration  - Pharmacy  - Lab - Cafe  2nd Floor: - PV Lab - Diagnostic Testing (echo, CT, nuclear med)  3rd Floor: - Vacant  4th Floor: - TCTS (cardiothoracic surgery) - AFib Clinic - Structural Heart Clinic - Vascular Surgery  - Vascular Ultrasound  5th Floor: - HeartCare Cardiology (general and EP) - Clinical Pharmacy for coumadin, hypertension, lipid, weight-loss medications, and med management appointments    Valet parking services will be available as well.

## 2023-05-05 NOTE — Telephone Encounter (Signed)
 Called and given below message to daughter. She verbalized understanding and she will see what her Mom wants to do about the appt.

## 2023-05-05 NOTE — Progress Notes (Signed)
 Cardiology Office Note:  .   Date:  05/05/2023  ID:  Abigail Hoover, Abigail Hoover Jul 25, 1945, MRN [id number] PCP: Lanae Pinal, MD  [facility] Providers Cardiologist:  Fransico Ivy, MD PCP: Lanae Pinal, MD  Chief Complaint  Patient presents with   Stable angina    Follow-up      History of Present Illness: .    Abigail Hoover is a 78 y.o. female with hypertension, type 2 diabetes mellitus, hypothyroidism, CKD stage 4, anemia, leg edema   Patient is here with her nephew 21.  She continues to have episodes of chest pain, with no clear correlation with exertion, eating habits.  Symptoms are not improved with PPI medications, neither has improved with current antianginal therapy.  In the past, had recommended PET CT scan for definitive ischemic  evaluation, which is now scheduled for March 2025.  In 03/2023, she had a mechanical fall, fortunately with no serious injuries.  Workup recently, has showed improvement in creatinine, but low sodium between 127-130.  She recently had blood work through her nephrologist Dr. Wynema Heck, results not available to me.  Vitals:   05/05/23 1150  BP: 118/76  Pulse: 85  Resp: 16  SpO2: 94%     ROS:  Review of Systems  Cardiovascular:  Positive for chest pain. Negative for dyspnea on exertion, leg swelling, palpitations and syncope.     Studies Reviewed: Aaron Aas        EKG 05/05/2023: Normal sinus rhythm Normal ECG When compared with ECG of 26-Aug-2022 15:09, No significant change was found    Independently interpreted 02/2023: Na 127 Cr 1.91, eGFR 26   Physical Exam:   Physical Exam Vitals and nursing note reviewed.  Constitutional:      General: She is not in acute distress. Neck:     Vascular: No JVD.  Cardiovascular:     Rate and Rhythm: Normal rate and regular rhythm.     Heart sounds: Normal heart sounds. No murmur heard. Pulmonary:     Effort: Pulmonary effort is normal.     Breath sounds: Normal breath  sounds. No wheezing or rales.  Musculoskeletal:     Right lower leg: Edema (1+) present.     Left lower leg: Edema (1+) present.      VISIT DIAGNOSES:   ICD-10-CM   1. Essential hypertension  I10 EKG 12-Lead    2. Precordial pain  R07.2        ASSESSMENT AND PLAN: .    Abigail Hoover is a 78 y.o. female with hypertension, type 2 diabetes mellitus, hypothyroidism, CKD stage 4, anemia, leg edema, now with possible angina symptoms   Chest pain: Retrosternal, occurs with both rest and exertion. Given her risk factors including uncontrolled type 2 diabetes mellitus, I cannot exclude angina as etiology, even though prior stress testing has been unremarkable in 12/2021. PET/CT scan scheduled for March 2025. We will need to be careful about further recommendations symptoms of invasive management given her GFR in low 20s. Continue Aspirin , stain, metoprolol , Imdur .    Hypertension: Controlled   Leg edema: 1+ bilateral leg edema persists. Given renal dysfunction, no changes to medication therapy today. Continue follow-up with nephrologist Dr. Wynema Heck.   Type 2 diabetes mellitus: Continue follow-up with endocrinology.  Informed Consent   Shared Decision Making/Informed Consent The risks [chest pain, shortness of breath, cardiac arrhythmias, dizziness, blood pressure fluctuations, myocardial infarction, stroke/transient ischemic attack, nausea, vomiting, allergic reaction, radiation exposure, metallic taste sensation and life-threatening  complications (estimated to be 1 in 10,000)], benefits (risk stratification, diagnosing coronary artery disease, treatment guidance) and alternatives of a cardiac PET stress test were discussed in detail with Abigail Hoover and she agrees to proceed.      F/u in 3 months  Signed, Cody Das, MD

## 2023-05-05 NOTE — Telephone Encounter (Signed)
 Yes, she should keep her appt She asked me to check her breast every visit

## 2023-05-08 ENCOUNTER — Ambulatory Visit
Admission: RE | Admit: 2023-05-08 | Discharge: 2023-05-08 | Disposition: A | Discharge status: Home / Self Care | Payer: Medicare Other | Source: Ambulatory Visit | Attending: Orthopaedic Surgery | Admitting: Orthopaedic Surgery

## 2023-05-08 ENCOUNTER — Ambulatory Visit: Payer: Medicare Other | Admitting: Rheumatology

## 2023-05-08 DIAGNOSIS — L409 Psoriasis, unspecified: Secondary | ICD-10-CM

## 2023-05-08 DIAGNOSIS — Z8639 Personal history of other endocrine, nutritional and metabolic disease: Secondary | ICD-10-CM

## 2023-05-08 DIAGNOSIS — M170 Bilateral primary osteoarthritis of knee: Secondary | ICD-10-CM

## 2023-05-08 DIAGNOSIS — S72002A Fracture of unspecified part of neck of left femur, initial encounter for closed fracture: Secondary | ICD-10-CM

## 2023-05-08 DIAGNOSIS — E790 Hyperuricemia without signs of inflammatory arthritis and tophaceous disease: Secondary | ICD-10-CM

## 2023-05-08 DIAGNOSIS — M8000XS Age-related osteoporosis with current pathological fracture, unspecified site, sequela: Secondary | ICD-10-CM

## 2023-05-08 DIAGNOSIS — N183 Chronic kidney disease, stage 3 unspecified: Secondary | ICD-10-CM

## 2023-05-08 DIAGNOSIS — M1120 Other chondrocalcinosis, unspecified site: Secondary | ICD-10-CM

## 2023-05-08 DIAGNOSIS — E1149 Type 2 diabetes mellitus with other diabetic neurological complication: Secondary | ICD-10-CM

## 2023-05-08 DIAGNOSIS — Z5181 Encounter for therapeutic drug level monitoring: Secondary | ICD-10-CM

## 2023-05-08 DIAGNOSIS — M47816 Spondylosis without myelopathy or radiculopathy, lumbar region: Secondary | ICD-10-CM

## 2023-05-08 DIAGNOSIS — M5416 Radiculopathy, lumbar region: Secondary | ICD-10-CM

## 2023-05-08 DIAGNOSIS — E559 Vitamin D deficiency, unspecified: Secondary | ICD-10-CM

## 2023-05-08 DIAGNOSIS — M19042 Primary osteoarthritis, left hand: Secondary | ICD-10-CM

## 2023-05-08 DIAGNOSIS — I10 Essential (primary) hypertension: Secondary | ICD-10-CM

## 2023-05-08 DIAGNOSIS — M8008XS Age-related osteoporosis with current pathological fracture, vertebra(e), sequela: Secondary | ICD-10-CM

## 2023-05-08 DIAGNOSIS — E039 Hypothyroidism, unspecified: Secondary | ICD-10-CM

## 2023-05-08 DIAGNOSIS — M4015 Other secondary kyphosis, thoracolumbar region: Secondary | ICD-10-CM

## 2023-05-12 ENCOUNTER — Other Ambulatory Visit: Payer: Medicare Other

## 2023-05-19 ENCOUNTER — Other Ambulatory Visit: Payer: Medicare Other

## 2023-05-21 DIAGNOSIS — Z6827 Body mass index (BMI) 27.0-27.9, adult: Secondary | ICD-10-CM | POA: Diagnosis not present

## 2023-05-21 DIAGNOSIS — M5416 Radiculopathy, lumbar region: Secondary | ICD-10-CM | POA: Diagnosis not present

## 2023-05-22 ENCOUNTER — Other Ambulatory Visit: Payer: Self-pay | Admitting: Orthopaedic Surgery

## 2023-05-22 DIAGNOSIS — M1611 Unilateral primary osteoarthritis, right hip: Secondary | ICD-10-CM

## 2023-05-22 DIAGNOSIS — M25551 Pain in right hip: Secondary | ICD-10-CM

## 2023-05-26 DIAGNOSIS — R350 Frequency of micturition: Secondary | ICD-10-CM | POA: Diagnosis not present

## 2023-05-26 DIAGNOSIS — R102 Pelvic and perineal pain: Secondary | ICD-10-CM | POA: Diagnosis not present

## 2023-05-26 DIAGNOSIS — R3915 Urgency of urination: Secondary | ICD-10-CM | POA: Diagnosis not present

## 2023-05-26 DIAGNOSIS — N3010 Interstitial cystitis (chronic) without hematuria: Secondary | ICD-10-CM | POA: Diagnosis not present

## 2023-05-28 ENCOUNTER — Telehealth: Payer: Self-pay | Admitting: Cardiology

## 2023-05-28 NOTE — Telephone Encounter (Signed)
 error

## 2023-05-29 DIAGNOSIS — N2581 Secondary hyperparathyroidism of renal origin: Secondary | ICD-10-CM | POA: Diagnosis not present

## 2023-05-29 DIAGNOSIS — I129 Hypertensive chronic kidney disease with stage 1 through stage 4 chronic kidney disease, or unspecified chronic kidney disease: Secondary | ICD-10-CM | POA: Diagnosis not present

## 2023-05-29 DIAGNOSIS — D631 Anemia in chronic kidney disease: Secondary | ICD-10-CM | POA: Diagnosis not present

## 2023-05-29 DIAGNOSIS — N184 Chronic kidney disease, stage 4 (severe): Secondary | ICD-10-CM | POA: Diagnosis not present

## 2023-05-31 DIAGNOSIS — E1122 Type 2 diabetes mellitus with diabetic chronic kidney disease: Secondary | ICD-10-CM | POA: Diagnosis not present

## 2023-06-02 ENCOUNTER — Encounter: Payer: Self-pay | Admitting: Orthopaedic Surgery

## 2023-06-02 ENCOUNTER — Encounter: Payer: Self-pay | Admitting: Hematology and Oncology

## 2023-06-03 DIAGNOSIS — M170 Bilateral primary osteoarthritis of knee: Secondary | ICD-10-CM | POA: Diagnosis not present

## 2023-06-10 ENCOUNTER — Encounter: Payer: Self-pay | Admitting: Hematology and Oncology

## 2023-06-10 ENCOUNTER — Inpatient Hospital Stay: Payer: Medicare Other | Admitting: Hematology and Oncology

## 2023-06-10 ENCOUNTER — Inpatient Hospital Stay: Payer: Medicare Other | Attending: Hematology and Oncology

## 2023-06-10 ENCOUNTER — Inpatient Hospital Stay: Payer: Medicare Other

## 2023-06-10 VITALS — BP 143/51 | HR 61 | Temp 98.1°F | Resp 18 | Ht 61.0 in | Wt 133.6 lb

## 2023-06-10 DIAGNOSIS — D631 Anemia in chronic kidney disease: Secondary | ICD-10-CM

## 2023-06-10 DIAGNOSIS — E538 Deficiency of other specified B group vitamins: Secondary | ICD-10-CM | POA: Diagnosis not present

## 2023-06-10 DIAGNOSIS — N183 Chronic kidney disease, stage 3 unspecified: Secondary | ICD-10-CM | POA: Insufficient documentation

## 2023-06-10 DIAGNOSIS — Z79899 Other long term (current) drug therapy: Secondary | ICD-10-CM | POA: Diagnosis not present

## 2023-06-10 LAB — CBC WITH DIFFERENTIAL/PLATELET
Abs Immature Granulocytes: 0.02 10*3/uL (ref 0.00–0.07)
Basophils Absolute: 0 10*3/uL (ref 0.0–0.1)
Basophils Relative: 1 %
Eosinophils Absolute: 0.6 10*3/uL — ABNORMAL HIGH (ref 0.0–0.5)
Eosinophils Relative: 11 %
HCT: 31 % — ABNORMAL LOW (ref 36.0–46.0)
Hemoglobin: 10.1 g/dL — ABNORMAL LOW (ref 12.0–15.0)
Immature Granulocytes: 0 %
Lymphocytes Relative: 19 %
Lymphs Abs: 1 10*3/uL (ref 0.7–4.0)
MCH: 28.5 pg (ref 26.0–34.0)
MCHC: 32.6 g/dL (ref 30.0–36.0)
MCV: 87.3 fL (ref 80.0–100.0)
Monocytes Absolute: 0.4 10*3/uL (ref 0.1–1.0)
Monocytes Relative: 8 %
Neutro Abs: 3.4 10*3/uL (ref 1.7–7.7)
Neutrophils Relative %: 61 %
Platelets: 156 10*3/uL (ref 150–400)
RBC: 3.55 MIL/uL — ABNORMAL LOW (ref 3.87–5.11)
RDW: 15.4 % (ref 11.5–15.5)
WBC: 5.6 10*3/uL (ref 4.0–10.5)
nRBC: 0 % (ref 0.0–0.2)

## 2023-06-10 MED ORDER — DARBEPOETIN ALFA 200 MCG/0.4ML IJ SOSY
200.0000 ug | PREFILLED_SYRINGE | Freq: Once | INTRAMUSCULAR | Status: AC
  Administered 2023-06-10: 200 ug via SUBCUTANEOUS
  Filled 2023-06-10: qty 0.4

## 2023-06-10 NOTE — Progress Notes (Signed)
   [facility] OFFICE PROGRESS NOTE  Koirala, Dibas, MD  ASSESSMENT & PLAN:  Assessment & Plan Vitamin B 12 deficiency She has intermittent B12 deficiency She will receive B12 injection today along with darbepoetin injection Repeat B12 level in January was adequate Anemia in stage 3 chronic kidney disease, unspecified whether stage 3a or 3b CKD (HCC) I plan to see her back in 2 months for further follow-up We will continue ESA injection    No orders of the defined types were placed in this encounter.   INTERVAL HISTORY: Patient returns for recurrent anemia due to vitamin B12 def and anemia of chronic kidney disease Symptoms of anemia includes none We reviewed CBC results  SUMMARY OF HEMATOLOGIC HISTORY:  This is a patient that has been followed here since 2011 for chronic anemia In 2011, she had a bone marrow aspirate and biopsy that came back normal. The patient has been receiving Aranesp injection for chronic anemia up until 2014. It was subsequently discontinued due stability of the anemia. Starting 05/27/2014, darbepoetin was resumed at 200 g every other month to keep hemoglobin greater than 10 g. Treatment was discontinued in October 2016 and resumed in September 2017 The patient is subsequently found to have severe vitamin B12 deficiency and is started on vitamin B12 injection on December 25, 2016 along with resumption of darbepoetin injection    Vitals:   06/10/23 1056  BP: (!) 143/51  Pulse: 61  Resp: 18  Temp: 98.1 F (36.7 C)  SpO2: 100%

## 2023-06-10 NOTE — Assessment & Plan Note (Addendum)
 I plan to see her back in 2 months for further follow-up We will continue ESA injection

## 2023-06-10 NOTE — Assessment & Plan Note (Addendum)
 She has intermittent B12 deficiency She will receive B12 injection today along with darbepoetin injection Repeat B12 level in January was adequate

## 2023-06-11 ENCOUNTER — Ambulatory Visit (HOSPITAL_COMMUNITY)
Admission: RE | Admit: 2023-06-11 | Discharge: 2023-06-11 | Disposition: A | Discharge status: Home / Self Care | Payer: Medicare Other | Source: Ambulatory Visit | Attending: Cardiology | Admitting: Cardiology

## 2023-06-11 DIAGNOSIS — I2089 Other forms of angina pectoris: Secondary | ICD-10-CM | POA: Diagnosis not present

## 2023-06-11 LAB — NM PET CT CARDIAC PERFUSION MULTI W/ABSOLUTE BLOODFLOW
MBFR: 1.47
Nuc Rest EF: 70 %
Nuc Stress EF: 73 %
Rest MBF: 1.15 ml/g/min
Rest Nuclear Isotope Dose: 15.7 mCi
ST Depression (mm): 0 mm
Stress MBF: 1.69 ml/g/min
Stress Nuclear Isotope Dose: 15.9 mCi
TID: 1.09

## 2023-06-11 MED ORDER — RUBIDIUM RB82 GENERATOR (RUBYFILL): 15.7000 | PACK | Freq: Once | INTRAVENOUS | Status: DC

## 2023-06-11 MED ORDER — REGADENOSON 0.4 MG/5ML IV SOLN
0.4000 mg | Freq: Once | INTRAVENOUS | Status: AC
  Administered 2023-06-11: 0.4 mg via INTRAVENOUS

## 2023-06-11 MED ORDER — REGADENOSON 0.4 MG/5ML IV SOLN
INTRAVENOUS | Status: AC
  Filled 2023-06-11: qty 5

## 2023-06-12 ENCOUNTER — Inpatient Hospital Stay: Admission: RE | Admit: 2023-06-12 | Payer: Medicare Other | Source: Ambulatory Visit

## 2023-06-12 ENCOUNTER — Encounter: Payer: Self-pay | Admitting: Cardiology

## 2023-06-18 ENCOUNTER — Ambulatory Visit (INDEPENDENT_AMBULATORY_CARE_PROVIDER_SITE_OTHER): Admitting: Podiatry

## 2023-06-18 ENCOUNTER — Encounter: Payer: Self-pay | Admitting: Podiatry

## 2023-06-18 ENCOUNTER — Telehealth: Payer: Self-pay | Admitting: Cardiology

## 2023-06-18 DIAGNOSIS — M79675 Pain in left toe(s): Secondary | ICD-10-CM

## 2023-06-18 DIAGNOSIS — M79674 Pain in right toe(s): Secondary | ICD-10-CM

## 2023-06-18 DIAGNOSIS — E1142 Type 2 diabetes mellitus with diabetic polyneuropathy: Secondary | ICD-10-CM

## 2023-06-18 DIAGNOSIS — B351 Tinea unguium: Secondary | ICD-10-CM | POA: Diagnosis not present

## 2023-06-18 NOTE — Telephone Encounter (Signed)
Pt calling to f/u on CT results. Please advise

## 2023-06-18 NOTE — Progress Notes (Signed)
This patient returns to my office for at risk foot care.  This patient requires this care by a professional since this patient will be at risk due to having diabetes and CKD.  This patient is unable to cut nails herself since the patient cannot reach her nails.These nails are painful walking and wearing shoes.  This patient presents for at risk foot care today.  General Appearance  Alert, conversant and in no acute stress.  Vascular  Dorsalis pedis and posterior tibial  pulses are palpable  bilaterally.  Capillary return is within normal limits  bilaterally. Temperature is within normal limits  bilaterally.  Neurologic  Senn-Weinstein monofilament wire test within normal limits  bilaterally. Muscle power within normal limits bilaterally.  Nails Thick disfigured discolored nails with subungual debris  from hallux to fifth toes bilaterally. No evidence of bacterial infection or drainage bilaterally.  Orthopedic  No limitations of motion  feet .  No crepitus or effusions noted.  No bony pathology or digital deformities noted.  Skin  normotropic skin with no porokeratosis noted bilaterally.  No signs of infections or ulcers noted.     Onychomycosis  Pain in right toes  Pain in left toes  Consent was obtained for treatment procedures.   Mechanical debridement of nails 1-5  bilaterally performed with a nail nipper.  Filed with dremel without incident.    Return office visit      3 months                Told patient to return for periodic foot care and evaluation due to potential at risk complications.     DPM   

## 2023-06-18 NOTE — Telephone Encounter (Signed)
 Patwardhan, Anabel Bene, MD to Cherre Blanc Regarding result: NM PET CT CARDIAC PERFUSION MULTI W/ABSOLUTE BLOODFLOW      06/12/23 11:15 AM Unlikely to have any significant blockages in major heart arteries.  There could be disease in small vessels that can be seen in diabetic patients.  I do not think you need heart catheterization.  Continue current medications.   Regards, Dr. Rosemary Holms     The patient has been notified of the above result and verbalized understanding.  All questions (if any) were answered.

## 2023-06-20 ENCOUNTER — Telehealth: Payer: Self-pay

## 2023-06-20 DIAGNOSIS — Z8673 Personal history of transient ischemic attack (TIA), and cerebral infarction without residual deficits: Secondary | ICD-10-CM | POA: Diagnosis not present

## 2023-06-20 DIAGNOSIS — R413 Other amnesia: Secondary | ICD-10-CM | POA: Diagnosis not present

## 2023-06-20 DIAGNOSIS — N184 Chronic kidney disease, stage 4 (severe): Secondary | ICD-10-CM | POA: Diagnosis not present

## 2023-06-20 DIAGNOSIS — E1122 Type 2 diabetes mellitus with diabetic chronic kidney disease: Secondary | ICD-10-CM | POA: Diagnosis not present

## 2023-06-20 NOTE — Telephone Encounter (Signed)
 Returned daughters call and reviewed medication given at last injection appt.

## 2023-06-22 ENCOUNTER — Ambulatory Visit
Admission: RE | Admit: 2023-06-22 | Discharge: 2023-06-22 | Disposition: A | Discharge status: Home / Self Care | Source: Ambulatory Visit | Attending: Orthopaedic Surgery | Admitting: Orthopaedic Surgery

## 2023-06-22 DIAGNOSIS — M25551 Pain in right hip: Secondary | ICD-10-CM

## 2023-06-22 DIAGNOSIS — M1611 Unilateral primary osteoarthritis, right hip: Secondary | ICD-10-CM

## 2023-06-22 DIAGNOSIS — M25451 Effusion, right hip: Secondary | ICD-10-CM | POA: Diagnosis not present

## 2023-07-01 DIAGNOSIS — E1122 Type 2 diabetes mellitus with diabetic chronic kidney disease: Secondary | ICD-10-CM | POA: Diagnosis not present

## 2023-07-02 DIAGNOSIS — M5416 Radiculopathy, lumbar region: Secondary | ICD-10-CM | POA: Diagnosis not present

## 2023-07-03 DIAGNOSIS — N644 Mastodynia: Secondary | ICD-10-CM | POA: Diagnosis not present

## 2023-07-07 DIAGNOSIS — E039 Hypothyroidism, unspecified: Secondary | ICD-10-CM | POA: Diagnosis not present

## 2023-07-07 DIAGNOSIS — I10 Essential (primary) hypertension: Secondary | ICD-10-CM | POA: Diagnosis not present

## 2023-07-07 DIAGNOSIS — E782 Mixed hyperlipidemia: Secondary | ICD-10-CM | POA: Diagnosis not present

## 2023-07-07 DIAGNOSIS — E1142 Type 2 diabetes mellitus with diabetic polyneuropathy: Secondary | ICD-10-CM | POA: Diagnosis not present

## 2023-07-23 DIAGNOSIS — M48061 Spinal stenosis, lumbar region without neurogenic claudication: Secondary | ICD-10-CM | POA: Diagnosis not present

## 2023-07-23 DIAGNOSIS — M25551 Pain in right hip: Secondary | ICD-10-CM | POA: Diagnosis not present

## 2023-07-23 DIAGNOSIS — M1712 Unilateral primary osteoarthritis, left knee: Secondary | ICD-10-CM | POA: Diagnosis not present

## 2023-07-31 DIAGNOSIS — E1122 Type 2 diabetes mellitus with diabetic chronic kidney disease: Secondary | ICD-10-CM | POA: Diagnosis not present

## 2023-08-01 DIAGNOSIS — D649 Anemia, unspecified: Secondary | ICD-10-CM | POA: Diagnosis not present

## 2023-08-01 DIAGNOSIS — R300 Dysuria: Secondary | ICD-10-CM | POA: Diagnosis not present

## 2023-08-04 ENCOUNTER — Ambulatory Visit: Payer: Medicare Other | Admitting: Cardiology

## 2023-08-05 DIAGNOSIS — M1712 Unilateral primary osteoarthritis, left knee: Secondary | ICD-10-CM | POA: Diagnosis not present

## 2023-08-07 ENCOUNTER — Encounter: Payer: Self-pay | Admitting: Hematology and Oncology

## 2023-08-07 ENCOUNTER — Inpatient Hospital Stay

## 2023-08-07 ENCOUNTER — Inpatient Hospital Stay: Attending: Hematology and Oncology | Admitting: Hematology and Oncology

## 2023-08-07 VITALS — BP 151/55 | HR 106 | Temp 98.2°F | Resp 17 | Ht 61.0 in | Wt 136.2 lb

## 2023-08-07 DIAGNOSIS — E538 Deficiency of other specified B group vitamins: Secondary | ICD-10-CM | POA: Insufficient documentation

## 2023-08-07 DIAGNOSIS — D631 Anemia in chronic kidney disease: Secondary | ICD-10-CM | POA: Insufficient documentation

## 2023-08-07 DIAGNOSIS — N183 Chronic kidney disease, stage 3 unspecified: Secondary | ICD-10-CM

## 2023-08-07 DIAGNOSIS — N184 Chronic kidney disease, stage 4 (severe): Secondary | ICD-10-CM | POA: Diagnosis not present

## 2023-08-07 DIAGNOSIS — Z79899 Other long term (current) drug therapy: Secondary | ICD-10-CM | POA: Insufficient documentation

## 2023-08-07 LAB — CBC WITH DIFFERENTIAL/PLATELET
Abs Immature Granulocytes: 0.01 10*3/uL (ref 0.00–0.07)
Basophils Absolute: 0 10*3/uL (ref 0.0–0.1)
Basophils Relative: 0 %
Eosinophils Absolute: 0.8 10*3/uL — ABNORMAL HIGH (ref 0.0–0.5)
Eosinophils Relative: 14 %
HCT: 30.6 % — ABNORMAL LOW (ref 36.0–46.0)
Hemoglobin: 10 g/dL — ABNORMAL LOW (ref 12.0–15.0)
Immature Granulocytes: 0 %
Lymphocytes Relative: 16 %
Lymphs Abs: 0.9 10*3/uL (ref 0.7–4.0)
MCH: 28.2 pg (ref 26.0–34.0)
MCHC: 32.7 g/dL (ref 30.0–36.0)
MCV: 86.2 fL (ref 80.0–100.0)
Monocytes Absolute: 0.3 10*3/uL (ref 0.1–1.0)
Monocytes Relative: 6 %
Neutro Abs: 3.6 10*3/uL (ref 1.7–7.7)
Neutrophils Relative %: 64 %
Platelets: 157 10*3/uL (ref 150–400)
RBC: 3.55 MIL/uL — ABNORMAL LOW (ref 3.87–5.11)
RDW: 15.1 % (ref 11.5–15.5)
WBC: 5.6 10*3/uL (ref 4.0–10.5)
nRBC: 0 % (ref 0.0–0.2)

## 2023-08-07 MED ORDER — DARBEPOETIN ALFA 200 MCG/0.4ML IJ SOSY
200.0000 ug | PREFILLED_SYRINGE | Freq: Once | INTRAMUSCULAR | Status: AC
  Administered 2023-08-07: 200 ug via SUBCUTANEOUS
  Filled 2023-08-07: qty 0.4

## 2023-08-07 MED ORDER — CYANOCOBALAMIN 1000 MCG/ML IJ SOLN
1000.0000 ug | Freq: Once | INTRAMUSCULAR | Status: AC
  Administered 2023-08-07: 1000 ug via INTRAMUSCULAR
  Filled 2023-08-07: qty 1

## 2023-08-07 NOTE — Assessment & Plan Note (Addendum)
 She has intermittent B12 deficiency She will receive B12 injection today along with darbepoetin injection Repeat B12 level in January was adequate

## 2023-08-07 NOTE — Assessment & Plan Note (Addendum)
 Her blood count is satisfactory and we will proceed with darbepoetin injection today I plan to see her back in 2 months for further follow-up We will continue ESA injection I plan to recheck iron  studies again in September

## 2023-08-07 NOTE — Progress Notes (Signed)
   [facility] OFFICE PROGRESS NOTE  Koirala, Dibas, MD  ASSESSMENT & PLAN:  Assessment & Plan Anemia in stage 3 chronic kidney disease, unspecified whether stage 3a or 3b CKD (HCC) Her blood count is satisfactory and we will proceed with darbepoetin injection today I plan to see her back in 2 months for further follow-up We will continue ESA injection I plan to recheck iron  studies again in September Vitamin B 12 deficiency She has intermittent B12 deficiency She will receive B12 injection today along with darbepoetin injection Repeat B12 level in January was adequate    No orders of the defined types were placed in this encounter.   INTERVAL HISTORY: Patient returns for recurrent anemia Symptoms of anemia includes none We reviewed CBC results  SUMMARY OF HEMATOLOGIC HISTORY:  This is a patient that has been followed here since 2011 for chronic anemia In 2011, she had a bone marrow aspirate and biopsy that came back normal. The patient has been receiving Aranesp  injection for chronic anemia up until 2014. It was subsequently discontinued due stability of the anemia. Starting 05/27/2014, darbepoetin was resumed at 200 g every other month to keep hemoglobin greater than 10 g. Treatment was discontinued in October 2016 and resumed in September 2017 The patient is subsequently found to have severe vitamin B12 deficiency and is started on vitamin B12 injection on December 25, 2016 along with resumption of darbepoetin injection   Vitals:   08/07/23 1059  BP: (!) 151/55  Pulse: (!) 106  Resp: 17  Temp: 98.2 F (36.8 C)  SpO2: 100%

## 2023-08-12 DIAGNOSIS — M1712 Unilateral primary osteoarthritis, left knee: Secondary | ICD-10-CM | POA: Diagnosis not present

## 2023-08-13 ENCOUNTER — Other Ambulatory Visit (HOSPITAL_COMMUNITY): Payer: Self-pay

## 2023-08-19 DIAGNOSIS — M1712 Unilateral primary osteoarthritis, left knee: Secondary | ICD-10-CM | POA: Diagnosis not present

## 2023-08-26 ENCOUNTER — Other Ambulatory Visit: Payer: Self-pay

## 2023-08-27 DIAGNOSIS — L089 Local infection of the skin and subcutaneous tissue, unspecified: Secondary | ICD-10-CM | POA: Diagnosis not present

## 2023-08-27 DIAGNOSIS — L309 Dermatitis, unspecified: Secondary | ICD-10-CM | POA: Diagnosis not present

## 2023-08-31 DIAGNOSIS — E1122 Type 2 diabetes mellitus with diabetic chronic kidney disease: Secondary | ICD-10-CM | POA: Diagnosis not present

## 2023-09-01 ENCOUNTER — Other Ambulatory Visit: Payer: Self-pay | Admitting: Physician Assistant

## 2023-09-01 NOTE — Telephone Encounter (Signed)
 Please schedule patient a follow up visit. Patient due around 05/09/2023 . Thanks!

## 2023-09-01 NOTE — Telephone Encounter (Signed)
 Last Fill: 02/28/2023  Labs: 08/07/2023 RBC 3.55 Hemoglobin 10.0 HCT 30.6 Eosinophils 0.8 CMP- 07/07/2023  Sodium 135 Chloride 97 Glucose 176 BUN 26 Total Protein 5.8  Next Visit: Due around 05/09/2023. Message sent to the front to schedule.   Last Visit: 11/06/2022  DX: Chondrocalcinosis   Current Dose per office note 11/06/2022: Colchicine  0.3mg  by mouth daily.   Okay to refill Colchicine ?

## 2023-09-02 NOTE — Telephone Encounter (Signed)
 Called pts daughter ([name]) to schedule f/u appt. She stated she will call us  back once she talks with her mom.

## 2023-09-11 DIAGNOSIS — N3010 Interstitial cystitis (chronic) without hematuria: Secondary | ICD-10-CM | POA: Diagnosis not present

## 2023-09-11 DIAGNOSIS — N3941 Urge incontinence: Secondary | ICD-10-CM | POA: Diagnosis not present

## 2023-09-15 DIAGNOSIS — I8311 Varicose veins of right lower extremity with inflammation: Secondary | ICD-10-CM | POA: Diagnosis not present

## 2023-09-15 DIAGNOSIS — L2089 Other atopic dermatitis: Secondary | ICD-10-CM | POA: Diagnosis not present

## 2023-09-15 DIAGNOSIS — I8312 Varicose veins of left lower extremity with inflammation: Secondary | ICD-10-CM | POA: Diagnosis not present

## 2023-09-15 DIAGNOSIS — L249 Irritant contact dermatitis, unspecified cause: Secondary | ICD-10-CM | POA: Diagnosis not present

## 2023-09-19 ENCOUNTER — Encounter: Payer: Self-pay | Admitting: Hematology and Oncology

## 2023-09-22 ENCOUNTER — Other Ambulatory Visit (HOSPITAL_COMMUNITY): Payer: Self-pay

## 2023-09-22 ENCOUNTER — Other Ambulatory Visit: Payer: Self-pay

## 2023-09-22 ENCOUNTER — Other Ambulatory Visit: Payer: Self-pay | Admitting: Pharmacy Technician

## 2023-09-22 ENCOUNTER — Telehealth: Payer: Self-pay | Admitting: Pharmacist

## 2023-09-22 DIAGNOSIS — Z5181 Encounter for therapeutic drug level monitoring: Secondary | ICD-10-CM

## 2023-09-22 DIAGNOSIS — N184 Chronic kidney disease, stage 4 (severe): Secondary | ICD-10-CM | POA: Diagnosis not present

## 2023-09-22 DIAGNOSIS — M8000XS Age-related osteoporosis with current pathological fracture, unspecified site, sequela: Secondary | ICD-10-CM

## 2023-09-22 MED ORDER — DENOSUMAB 60 MG/ML ~~LOC~~ SOSY
60.0000 mg | PREFILLED_SYRINGE | Freq: Once | SUBCUTANEOUS | Status: AC
  Administered 2023-10-10: 60 mg via SUBCUTANEOUS

## 2023-09-22 MED ORDER — DENOSUMAB 60 MG/ML ~~LOC~~ SOSY
60.0000 mg | PREFILLED_SYRINGE | SUBCUTANEOUS | 0 refills | Status: DC
  Filled 2023-09-22: qty 1, 180d supply, fill #0

## 2023-09-22 NOTE — Telephone Encounter (Signed)
 Received call from [facility] regarding Prolia  authorization. Prolia  has been approved from 09/22/23 to 09/21/2024. They will fax approval letter  Authorization # [id number]  Per test claim is $0  Rx sent to [facility] to be couriered to clinic before appt 10/10/2023  Sherry Pennant, PharmD, MPH, BCPS, CPP Clinical Pharmacist (Rheumatology and Pulmonology)

## 2023-09-22 NOTE — Telephone Encounter (Signed)
 Received call from daughter, [name].  Patient due for Prolia  on 08/30/2023  Needs updated CMET.  Prolia  requires prior authorization through Part D  Submitted an URGENT Prior Authorization request to [facility] for PROLIA  via CoverMyMeds. Will update once we receive a response.  Key: [id number]

## 2023-09-22 NOTE — Progress Notes (Signed)
 Specialty Pharmacy Refill Coordination Note  Abigail Hoover is a 78 y.o. female assessed today regarding refills of clinic administered specialty medication(s) Denosumab  (PROLIA )   Clinic requested Courier to Provider Office   Delivery date: 10/06/23   Verified address: [facility] [address]   Medication will be filled on 10/03/23.  Appt 7/18

## 2023-09-24 DIAGNOSIS — N184 Chronic kidney disease, stage 4 (severe): Secondary | ICD-10-CM | POA: Diagnosis not present

## 2023-09-24 DIAGNOSIS — D631 Anemia in chronic kidney disease: Secondary | ICD-10-CM | POA: Diagnosis not present

## 2023-09-24 DIAGNOSIS — I129 Hypertensive chronic kidney disease with stage 1 through stage 4 chronic kidney disease, or unspecified chronic kidney disease: Secondary | ICD-10-CM | POA: Diagnosis not present

## 2023-09-24 DIAGNOSIS — N2581 Secondary hyperparathyroidism of renal origin: Secondary | ICD-10-CM | POA: Diagnosis not present

## 2023-09-26 NOTE — Progress Notes (Signed)
 Office Visit Note  Patient: Abigail Hoover             Date of Birth: December 12, 1945           MRN: [id number]             PCP: Regino Slater, MD Referring: Regino Slater, MD Visit Date: 10/10/2023 Occupation: @GUAROCC @  Subjective:  Medication management  History of Present Illness: NILAM QUAKENBUSH is a 78 y.o. female with osteoarthritis, degenerative disc disease, chondrocalcinosis and osteoporosis.  She returns today after her last visit to me in August 2024.  She continues to be on Prolia  injections for the treatment of osteoporosis.  Last Prolia  injection was on March 03, 2023.  She remains on colchicine  0.3 mg daily for the treatment of chondrocalcinosis.  She continues to have severe pain and discomfort in her lower back.  She states she is scheduled to have a cortisone injection in her lumbar spine.  She has been also followed by hematology for chronic anemia.  Her last visit was on October 07, 2023.  She also has history of B12 deficiency.  She is not taking calcium  due to chronic kidney disease.  She is on vitamin D .    Activities of Daily Living:  Patient reports morning stiffness for  none.   Patient Denies nocturnal pain.  Difficulty dressing/grooming: Denies Difficulty climbing stairs: Denies Difficulty getting out of chair: Denies Difficulty using hands for taps, buttons, cutlery, and/or writing: Denies  Review of Systems  Constitutional:  Positive for fatigue.  HENT:  Negative for mouth sores and mouth dryness.   Eyes:  Negative for dryness.  Respiratory:  Negative for shortness of breath.   Cardiovascular:  Negative for chest pain and palpitations.  Gastrointestinal:  Positive for constipation. Negative for blood in stool and diarrhea.  Endocrine: Negative for increased urination.  Genitourinary:  Negative for involuntary urination.  Musculoskeletal:  Positive for joint swelling. Negative for joint pain, gait problem, joint pain, myalgias, muscle weakness, morning  stiffness, muscle tenderness and myalgias.  Skin:  Negative for color change, rash, hair loss and sensitivity to sunlight.  Allergic/Immunologic: Negative for susceptible to infections.  Neurological:  Positive for headaches. Negative for dizziness.  Hematological:  Negative for swollen glands.  Psychiatric/Behavioral:  Negative for depressed mood and sleep disturbance. The patient is not nervous/anxious.     PMFS History:  Patient Active Problem List   Diagnosis Date Noted   Coronary artery disease involving native coronary artery of native heart without angina pectoris 05/05/2023   Leg edema 07/29/2022   Chronic idiopathic constipation 07/02/2022   Constipation 07/02/2022   Right lower quadrant pain 07/02/2022   Osteoarthritis of knee 03/06/2022   Preventive measure 01/31/2022   Retrosternal chest pain 01/16/2022   Stable angina (HCC) 01/16/2022   Chest pain on breathing 12/07/2021   Inflammation of joint of both hands 10/26/2020   DDD (degenerative disc disease), lumbar 08/26/2019   Age-related osteoporosis without current pathological fracture 08/26/2019   Primary osteoarthritis of both knees 08/26/2019   Primary osteoarthritis of both hands 08/26/2019   Diabetes mellitus with chronic kidney disease, with long-term current use of insulin  (HCC) 05/11/2019   Pain due to onychomycosis of toenails of both feet 09/30/2018   Trigger thumb of right hand 09/02/2018   Chronic pain of right thumb 08/27/2018   Pain in finger of right hand 06/01/2018   Plantar fasciitis of left foot 05/19/2018   Lower limb pain, inferior, left 04/28/2018   Dry  skin 03/12/2018   Neck pain 01/27/2018   Other fatigue 09/18/2017   Pain and swelling of right wrist 08/06/2017   At high risk for breast cancer 03/06/2017   Hypercalcemia 12/16/2016   Vitamin B 12 deficiency 12/16/2016   Displaced fracture of left femoral neck (HCC) 03/02/2016   Hyperkalemia 03/01/2016   Femoral neck fracture (HCC) 02/29/2016    Throat burning 01/23/2016   Anemia due to stage 3 (moderate) chronic renal failure treated with erythropoietin  (HCC) 12/18/2015   Chronic kidney disease, stage III (moderate) (HCC) 08/14/2015   Anemia in chronic kidney disease 05/01/2015   Acute coronary syndrome (HCC) 01/02/2015   Lumbar spondylosis with myelopathy 06/27/2014   Deficiency anemia 05/27/2014   Carpal tunnel syndrome 12/09/2013   Diabetic neuropathy (HCC) 12/09/2013   Diabetic polyneuropathy associated with type 2 diabetes mellitus (HCC) 12/09/2013   Type 2 diabetes mellitus with stage 4 chronic kidney disease, with long-term current use of insulin  (HCC) 04/21/2013   Disturbance of skin sensation 04/21/2013   Hypothyroidism 10/30/2012   Breast mass in female-left 03/10/2012   Weight loss 11/28/2011   Increased frequency of urination 10/16/2011   Urinary urgency 10/16/2011   Pelvic pain in female 10/16/2011   Family history of breast cancer 08/01/2010   Essential hypertension 02/01/2009   EDEMA LEGS 02/01/2009    Past Medical History:  Diagnosis Date   Anemia in chronic kidney disease 05/01/2015   Anxiety    Arthritis    Bladder irritation    Diabetes mellitus    GERD (gastroesophageal reflux disease)    Hypercholesterolemia    Hypertension    Hypothyroidism    Iron  deficiency anemia, unspecified    Psoriasis    Transfusion history 03/05/2016   for postoperative (ORIF) anemia superimposed on chronic anemia    Family History  Problem Relation Age of Onset   Heart disease Mother        heart attack   Stroke Father        brain hemorrhage   Diabetes Brother    Breast cancer Sister    Cancer Sister        breast cancer   Past Surgical History:  Procedure Laterality Date   ABDOMINAL HYSTERECTOMY     TAH   BREAST BIOPSY     Left breast biopsy benign lesion   BREAST BIOPSY  03/11/2012   Procedure: BREAST BIOPSY;  Surgeon: Krystal JINNY Russell, MD;  Location: [facility] OR;  Service: General;  Laterality: Left;   remove left breast mass   BREAST EXCISIONAL BIOPSY Left    CARDIAC CATHETERIZATION N/A 01/02/2015   Procedure: Left Heart Cath and Coronary Angiography;  Surgeon: Rober Chroman, MD;  Location: [facility] INVASIVE CV LAB;  Service: Cardiovascular;  Laterality: N/A;   CATARACT EXTRACTION     CERVICAL DISC SURGERY     COLONOSCOPY  2013   neg. next 2023.    EYE SURGERY     IR KYPHO EA ADDL LEVEL THORACIC OR LUMBAR  09/09/2019   IR KYPHO LUMBAR INC FX REDUCE BONE BX UNI/BIL CANNULATION INC/IMAGING  09/09/2019   IR RADIOLOGIST EVAL & MGMT  09/08/2019   LEFT HIP SURGERY     TOTAL HIP ARTHROPLASTY Left 03/02/2016   Procedure: TOTAL HIP ARTHROPLASTY ANTERIOR APPROACH;  Surgeon: Redell Shoals, MD;  Location: [facility] OR;  Service: Orthopedics;  Laterality: Left;   Social History   Social History Narrative   Patient is married with one child.   Patient is right handed.   Patient has  college education.   Patient drinks tea, two times a day   Immunization History  Administered Date(s) Administered   Fluad Quad(high Dose 65+) 12/22/2018, 01/31/2022   Influenza Split 11/13/2010   Influenza, High Dose Seasonal PF 01/25/2016, 12/24/2016, 01/03/2018   Influenza,inj,Quad PF,6+ Mos 12/29/2012, 12/20/2013, 01/16/2015   Influenza,inj,quad, With Preservative 01/03/2018, 12/24/2018   Pneumococcal Conjugate-13 01/16/2015   Pneumococcal Polysaccharide-23 03/24/2013     Objective: Vital Signs: BP (!) 102/52 (BP Location: Left Arm, Patient Position: Sitting, Cuff Size: Normal)   Pulse 60   Resp 16   Ht 5' (1.524 m)   Wt 131 lb (59.4 kg)   BMI 25.58 kg/m    Physical Exam Vitals and nursing note reviewed.  Constitutional:      Appearance: She is well-developed.  HENT:     Head: Normocephalic and atraumatic.  Eyes:     Conjunctiva/sclera: Conjunctivae normal.  Cardiovascular:     Rate and Rhythm: Normal rate and regular rhythm.     Heart sounds: Normal heart sounds.  Pulmonary:     Effort: Pulmonary effort  is normal.     Breath sounds: Normal breath sounds.  Abdominal:     General: Bowel sounds are normal.     Palpations: Abdomen is soft.  Musculoskeletal:     Cervical back: Normal range of motion.     Right lower leg: Edema present.     Left lower leg: Edema present.  Lymphadenopathy:     Cervical: No cervical adenopathy.  Skin:    General: Skin is warm and dry.     Capillary Refill: Capillary refill takes less than 2 seconds.  Neurological:     Mental Status: She is alert and oriented to person, place, and time.  Psychiatric:        Behavior: Behavior normal.      Musculoskeletal Exam: Patient was examined in the seated position.  She had limited lateral rotation of the cervical spine.  Thoracic kyphosis was noted without any tenderness.  She has some discomfort in the lower lumbar region.  Shoulder joint flexion was about 140 degrees bilaterally.  Elbow joints and wrist joints were in good range of motion.  She had bilateral PIP and DIP thickening.  Hip joints could not be assessed.  Knee joints with good range of motion.  She had bilateral lower extremity edema.  CDAI Exam: CDAI Score: -- Patient Global: --; Provider Global: -- Swollen: --; Tender: -- Joint Exam 10/10/2023   No joint exam has been documented for this visit   There is currently no information documented on the homunculus. Go to the Rheumatology activity and complete the homunculus joint exam.  Investigation: No additional findings.  Imaging: No results found.  Recent Labs: Lab Results  Component Value Date   WBC 6.8 10/07/2023   HGB 9.8 (L) 10/07/2023   PLT 177 10/07/2023   NA 127 (L) 02/26/2023   K 3.8 02/26/2023   CL 86 (L) 02/26/2023   CO2 31 02/26/2023   GLUCOSE 142 (H) 02/26/2023   BUN 25 (H) 02/26/2023   CREATININE 1.91 (H) 02/26/2023   BILITOT 0.4 02/13/2023   ALKPHOS 59 02/13/2023   AST 18 02/13/2023   ALT 12 02/13/2023   PROT 6.0 (L) 02/13/2023   ALBUMIN 3.9 02/13/2023   CALCIUM   10.1 02/26/2023   GFRAA 25 (L) 09/20/2019     Speciality Comments: Forteo  started 09/29/2019 Prolia  started 07/05/21  Procedures:  No procedures performed Allergies: Atorvastatin and Ezetimibe   Assessment / Plan:  Visit Diagnoses: Age-related osteoporosis with current pathological fracture, sequela - 04/23/22 T-score -2.4, BMD 0.587 right femoral neck, no comparison, left one third radius T-score -0.60, BMD 0.659-1% change, right total femur 8% change.  Her last Prolia  injection was on March 02, 2024.  She has been tolerating Prolia  well.  Patient was here to receive her next Prolia  injection.10/09/2023 creatinine 2.12, GFR 23, calcium  9.1, magnesium  1.8, 09/22/23 vitamin D  66.1 with [facility] and [facility].  Vitamin D  deficiency-vitamin D  was normal on September 22, 2023 66.1.  Medication monitoring encounter - started on Forteo  on September 29, 2019 and completed on July 2022. Prolia  started on 07/05/2021, last dose: March 02, 2024.  She will get next dose today.  Other secondary kyphosis, thoracolumbar region-unchanged.  Vertebral fracture, osteoporotic, sequela - status post kyphoplasty.  Displaced fracture of left femoral neck (HCC)-doing well.  Degeneration of intervertebral disc of lumbar region without discogenic back pain or lower extremity pain-she has been followed by Dr. Mariea.  She continues to have lower back pain and discomfort.  She states she is scheduled to get a cortisone injection to her lumbar spine.  Facet arthropathy, lumbar-chronic pain  Primary osteoarthritis of both hands-she had PIP and DIP thickening without discomfort.  Joint protection was discussed.  Primary osteoarthritis of both knees-she continues to have some lower back pain.  Chondrocalcinosis - Colchicine  0.3mg  by mouth daily.  She has not had any flares of pseudogout.  I reduce the dose of colchicine  to 0.3 mg p.o. every other day.  Hyperuricemia-no flares of gout.  Psoriasis-patient  gets occasional lesions currently not symptomatic.  Essential hypertension-blood pressure was 102/52.  History of diabetes mellitus  Hypothyroidism, unspecified type  Other diabetic neurological complication associated with type 2 diabetes mellitus (HCC)-she is on insulin .  Anemia due to stage 3 (moderate) chronic renal failure treated with erythropoietin  (HCC) - followed by Dr. DOROTHA Blanch and hematology.  Orders: No orders of the defined types were placed in this encounter.  Meds ordered this encounter  Medications   colchicine  0.6 MG tablet    Sig: Take 0.5 tablets (0.3 mg total) by mouth every other day.    PATIENT NEEDS REFILL     Follow-Up Instructions: Return in about 6 months (around 04/11/2024) for Osteoarthritis, Osteoporosis.   Maya Nash, MD  Note - This record has been created using Animal nutritionist.  Chart creation errors have been sought, but may not always  have been located. Such creation errors do not reflect on  the standard of medical care.

## 2023-09-29 ENCOUNTER — Telehealth: Payer: Self-pay | Admitting: Rheumatology

## 2023-09-29 NOTE — Telephone Encounter (Signed)
 Attempted to contact Abigail Hoover and left message to have labs faxed to our office so we may review them. Provided fax number on voicemail.

## 2023-09-29 NOTE — Telephone Encounter (Signed)
 Pts daughter called stating pt got her labs drawn last week and that dr ordered all blood work and test. Pts daughter was wondering if those labs would be okay to use for her prolia  shot on 10/10/23. Pts daughter would like a call back at [phone]

## 2023-09-30 ENCOUNTER — Ambulatory Visit (INDEPENDENT_AMBULATORY_CARE_PROVIDER_SITE_OTHER): Admitting: Podiatry

## 2023-09-30 ENCOUNTER — Encounter: Payer: Self-pay | Admitting: Hematology and Oncology

## 2023-09-30 ENCOUNTER — Encounter: Payer: Self-pay | Admitting: Podiatry

## 2023-09-30 DIAGNOSIS — M79675 Pain in left toe(s): Secondary | ICD-10-CM

## 2023-09-30 DIAGNOSIS — M79674 Pain in right toe(s): Secondary | ICD-10-CM | POA: Diagnosis not present

## 2023-09-30 DIAGNOSIS — B351 Tinea unguium: Secondary | ICD-10-CM

## 2023-09-30 DIAGNOSIS — N183 Chronic kidney disease, stage 3 unspecified: Secondary | ICD-10-CM

## 2023-09-30 DIAGNOSIS — E1142 Type 2 diabetes mellitus with diabetic polyneuropathy: Secondary | ICD-10-CM

## 2023-09-30 DIAGNOSIS — E1122 Type 2 diabetes mellitus with diabetic chronic kidney disease: Secondary | ICD-10-CM | POA: Diagnosis not present

## 2023-09-30 NOTE — Progress Notes (Signed)
 This patient returns to my office for at risk foot care.  This patient requires this care by a professional since this patient will be at risk due to having diabetes and CKD. This patient is unable to cut nails herself since the patient cannot reach her nails.These nails are painful walking and wearing shoes.  This patient presents for at risk foot care today.  General Appearance  Alert, conversant and in no acute stress.  Vascular  Dorsalis pedis and posterior tibial  pulses are palpable  bilaterally.  Capillary return is within normal limits  bilaterally. Temperature is within normal limits  bilaterally.  Neurologic  Senn-Weinstein monofilament wire test within normal limits  bilaterally. Muscle power within normal limits bilaterally.  Nails Thick disfigured discolored nails with subungual debris  from hallux to fifth toes bilaterally. No evidence of bacterial infection or drainage bilaterally. Pincer nails   Orthopedic  No limitations of motion  feet .  No crepitus or effusions noted.  No bony pathology or digital deformities noted.  Skin  normotropic skin with no porokeratosis noted bilaterally.  No signs of infections or ulcers noted.     Onychomycosis  Pain in right toes  Pain in left toes  Consent was obtained for treatment procedures.   Mechanical debridement of nails 1-5  bilaterally performed with a nail nipper.  Filed with dremel without incident.    Return office visit    3   months                  Told patient to return for periodic foot care and evaluation due to potential at risk complications.   Cordella Bold DPM

## 2023-10-02 DIAGNOSIS — E1142 Type 2 diabetes mellitus with diabetic polyneuropathy: Secondary | ICD-10-CM | POA: Diagnosis not present

## 2023-10-02 DIAGNOSIS — Z978 Presence of other specified devices: Secondary | ICD-10-CM | POA: Diagnosis not present

## 2023-10-02 DIAGNOSIS — I10 Essential (primary) hypertension: Secondary | ICD-10-CM | POA: Diagnosis not present

## 2023-10-02 DIAGNOSIS — E039 Hypothyroidism, unspecified: Secondary | ICD-10-CM | POA: Diagnosis not present

## 2023-10-02 DIAGNOSIS — Z794 Long term (current) use of insulin: Secondary | ICD-10-CM | POA: Diagnosis not present

## 2023-10-03 ENCOUNTER — Other Ambulatory Visit: Payer: Self-pay

## 2023-10-06 DIAGNOSIS — M25562 Pain in left knee: Secondary | ICD-10-CM | POA: Diagnosis not present

## 2023-10-06 NOTE — Telephone Encounter (Signed)
 Patient had labs CMP and Vitamin D  completed on 09/22/2023 with [facility] (lab results are in [facility])  Sherry Pennant, PharmD, MPH, BCPS, CPP Clinical Pharmacist (Rheumatology and Pulmonology)

## 2023-10-07 ENCOUNTER — Inpatient Hospital Stay: Admitting: Hematology and Oncology

## 2023-10-07 ENCOUNTER — Inpatient Hospital Stay

## 2023-10-07 ENCOUNTER — Encounter: Payer: Self-pay | Admitting: Hematology and Oncology

## 2023-10-07 ENCOUNTER — Inpatient Hospital Stay: Attending: Hematology and Oncology

## 2023-10-07 VITALS — BP 134/54 | HR 60 | Temp 97.4°F | Resp 18 | Ht 61.0 in | Wt 129.4 lb

## 2023-10-07 DIAGNOSIS — N183 Chronic kidney disease, stage 3 unspecified: Secondary | ICD-10-CM

## 2023-10-07 DIAGNOSIS — Z79899 Other long term (current) drug therapy: Secondary | ICD-10-CM | POA: Diagnosis not present

## 2023-10-07 DIAGNOSIS — E538 Deficiency of other specified B group vitamins: Secondary | ICD-10-CM

## 2023-10-07 DIAGNOSIS — D631 Anemia in chronic kidney disease: Secondary | ICD-10-CM | POA: Insufficient documentation

## 2023-10-07 DIAGNOSIS — R5383 Other fatigue: Secondary | ICD-10-CM | POA: Diagnosis not present

## 2023-10-07 DIAGNOSIS — M8000XS Age-related osteoporosis with current pathological fracture, unspecified site, sequela: Secondary | ICD-10-CM

## 2023-10-07 DIAGNOSIS — Z5181 Encounter for therapeutic drug level monitoring: Secondary | ICD-10-CM

## 2023-10-07 LAB — CBC WITH DIFFERENTIAL/PLATELET
Abs Immature Granulocytes: 0.02 K/uL (ref 0.00–0.07)
Basophils Absolute: 0 K/uL (ref 0.0–0.1)
Basophils Relative: 0 %
Eosinophils Absolute: 0.6 K/uL — ABNORMAL HIGH (ref 0.0–0.5)
Eosinophils Relative: 9 %
HCT: 29.1 % — ABNORMAL LOW (ref 36.0–46.0)
Hemoglobin: 9.8 g/dL — ABNORMAL LOW (ref 12.0–15.0)
Immature Granulocytes: 0 %
Lymphocytes Relative: 17 %
Lymphs Abs: 1.2 K/uL (ref 0.7–4.0)
MCH: 27.9 pg (ref 26.0–34.0)
MCHC: 33.7 g/dL (ref 30.0–36.0)
MCV: 82.9 fL (ref 80.0–100.0)
Monocytes Absolute: 0.5 K/uL (ref 0.1–1.0)
Monocytes Relative: 7 %
Neutro Abs: 4.5 K/uL (ref 1.7–7.7)
Neutrophils Relative %: 67 %
Platelets: 177 K/uL (ref 150–400)
RBC: 3.51 MIL/uL — ABNORMAL LOW (ref 3.87–5.11)
RDW: 15.1 % (ref 11.5–15.5)
WBC: 6.8 K/uL (ref 4.0–10.5)
nRBC: 0 % (ref 0.0–0.2)

## 2023-10-07 MED ORDER — DARBEPOETIN ALFA 200 MCG/0.4ML IJ SOSY
200.0000 ug | PREFILLED_SYRINGE | Freq: Once | INTRAMUSCULAR | Status: AC
  Administered 2023-10-07: 200 ug via SUBCUTANEOUS
  Filled 2023-10-07: qty 0.4

## 2023-10-07 MED ORDER — CYANOCOBALAMIN 1000 MCG/ML IJ SOLN
1000.0000 ug | Freq: Once | INTRAMUSCULAR | Status: DC
Start: 2023-10-07 — End: 2023-10-07

## 2023-10-07 NOTE — Assessment & Plan Note (Addendum)
 Her blood count is satisfactory and we will proceed with darbepoetin injection today I plan to see her back in 2 months for further follow-up We will continue ESA injection I plan to recheck iron  studies again in September

## 2023-10-07 NOTE — Progress Notes (Signed)
   [facility] OFFICE PROGRESS NOTE  Koirala, Dibas, MD  ASSESSMENT & PLAN:  Assessment & Plan Anemia in stage 3 chronic kidney disease, unspecified whether stage 3a or 3b CKD (HCC) Her blood count is satisfactory and we will proceed with darbepoetin injection today I plan to see her back in 2 months for further follow-up We will continue ESA injection I plan to recheck iron  studies again in September Vitamin B 12 deficiency She has intermittent B12 deficiency Repeat B12 level in January was adequate She will continue vitamin B12 injection every 3 months    Orders Placed This Encounter  Procedures   Ferritin    Standing Status:   Future    Expiration Date:   10/06/2024   Iron  and Iron  Binding Capacity ([facility] only)    Standing Status:   Future    Expiration Date:   10/06/2024   Vitamin B12    Standing Status:   Future    Expiration Date:   10/06/2024    INTERVAL HISTORY: Patient returns for recurrent anemia Symptoms of anemia includes fatigue We reviewed CBC results  SUMMARY OF HEMATOLOGIC HISTORY:  This is a patient that has been followed here since 2011 for chronic anemia In 2011, she had a bone marrow aspirate and biopsy that came back normal. The patient has been receiving Aranesp  injection for chronic anemia up until 2014. It was subsequently discontinued due stability of the anemia. Starting 05/27/2014, darbepoetin was resumed at 200 g every other month to keep hemoglobin greater than 10 g. Treatment was discontinued in October 2016 and resumed in September 2017 The patient is subsequently found to have severe vitamin B12 deficiency and is started on vitamin B12 injection on December 25, 2016 along with resumption of darbepoetin injection   Vitals:   10/07/23 1049  BP: (!) 134/54  Pulse: 60  Resp: 18  Temp: (!) 97.4 F (36.3 C)  SpO2: 100%

## 2023-10-07 NOTE — Assessment & Plan Note (Addendum)
 She has intermittent B12 deficiency Repeat B12 level in January was adequate She will continue vitamin B12 injection every 3 months

## 2023-10-09 DIAGNOSIS — I129 Hypertensive chronic kidney disease with stage 1 through stage 4 chronic kidney disease, or unspecified chronic kidney disease: Secondary | ICD-10-CM | POA: Diagnosis not present

## 2023-10-09 DIAGNOSIS — N184 Chronic kidney disease, stage 4 (severe): Secondary | ICD-10-CM | POA: Diagnosis not present

## 2023-10-09 DIAGNOSIS — D631 Anemia in chronic kidney disease: Secondary | ICD-10-CM | POA: Diagnosis not present

## 2023-10-10 ENCOUNTER — Ambulatory Visit: Attending: Rheumatology | Admitting: Rheumatology

## 2023-10-10 ENCOUNTER — Encounter: Payer: Self-pay | Admitting: Rheumatology

## 2023-10-10 VITALS — BP 102/52 | HR 60 | Resp 16 | Ht 60.0 in | Wt 131.0 lb

## 2023-10-10 DIAGNOSIS — M1120 Other chondrocalcinosis, unspecified site: Secondary | ICD-10-CM

## 2023-10-10 DIAGNOSIS — L409 Psoriasis, unspecified: Secondary | ICD-10-CM

## 2023-10-10 DIAGNOSIS — Z5181 Encounter for therapeutic drug level monitoring: Secondary | ICD-10-CM

## 2023-10-10 DIAGNOSIS — D631 Anemia in chronic kidney disease: Secondary | ICD-10-CM

## 2023-10-10 DIAGNOSIS — M19042 Primary osteoarthritis, left hand: Secondary | ICD-10-CM

## 2023-10-10 DIAGNOSIS — M4015 Other secondary kyphosis, thoracolumbar region: Secondary | ICD-10-CM | POA: Diagnosis not present

## 2023-10-10 DIAGNOSIS — Z8639 Personal history of other endocrine, nutritional and metabolic disease: Secondary | ICD-10-CM

## 2023-10-10 DIAGNOSIS — M8000XS Age-related osteoporosis with current pathological fracture, unspecified site, sequela: Secondary | ICD-10-CM | POA: Diagnosis not present

## 2023-10-10 DIAGNOSIS — N183 Chronic kidney disease, stage 3 unspecified: Secondary | ICD-10-CM

## 2023-10-10 DIAGNOSIS — E790 Hyperuricemia without signs of inflammatory arthritis and tophaceous disease: Secondary | ICD-10-CM

## 2023-10-10 DIAGNOSIS — M170 Bilateral primary osteoarthritis of knee: Secondary | ICD-10-CM

## 2023-10-10 DIAGNOSIS — M19041 Primary osteoarthritis, right hand: Secondary | ICD-10-CM

## 2023-10-10 DIAGNOSIS — M8008XS Age-related osteoporosis with current pathological fracture, vertebra(e), sequela: Secondary | ICD-10-CM

## 2023-10-10 DIAGNOSIS — E039 Hypothyroidism, unspecified: Secondary | ICD-10-CM

## 2023-10-10 DIAGNOSIS — M8008XA Age-related osteoporosis with current pathological fracture, vertebra(e), initial encounter for fracture: Secondary | ICD-10-CM

## 2023-10-10 DIAGNOSIS — E559 Vitamin D deficiency, unspecified: Secondary | ICD-10-CM | POA: Diagnosis not present

## 2023-10-10 DIAGNOSIS — M47816 Spondylosis without myelopathy or radiculopathy, lumbar region: Secondary | ICD-10-CM

## 2023-10-10 DIAGNOSIS — E1149 Type 2 diabetes mellitus with other diabetic neurological complication: Secondary | ICD-10-CM

## 2023-10-10 DIAGNOSIS — S72002A Fracture of unspecified part of neck of left femur, initial encounter for closed fracture: Secondary | ICD-10-CM

## 2023-10-10 DIAGNOSIS — M51369 Other intervertebral disc degeneration, lumbar region without mention of lumbar back pain or lower extremity pain: Secondary | ICD-10-CM

## 2023-10-10 DIAGNOSIS — I10 Essential (primary) hypertension: Secondary | ICD-10-CM

## 2023-10-10 MED ORDER — COLCHICINE 0.6 MG PO TABS: 0.3000 mg | ORAL_TABLET | ORAL | Status: DC

## 2023-10-10 NOTE — Patient Instructions (Signed)
 Please reduce the dose of colchicine  0.6 mg tablet, half tablet every other day only.

## 2023-10-10 NOTE — Progress Notes (Signed)
 Subjective:   Patient presents to clinic today to receive bi-annual dose of Prolia .  Patient running a fever or have signs/symptoms of infection? No  Patient currently on antibiotics for the treatment of infection? No  Patient had fall in the last 6 months?  No  If yes, did it require medical attention? No   Patient taking calcium  1200 mg daily through diet or supplement and at least 800 units vitamin D ? No  Objective: CMP     Component Value Date/Time   NA 127 (L) 02/26/2023 0257   NA 140 01/18/2022 1150   NA 138 12/12/2016 1150   K 3.8 02/26/2023 0257   K 4.6 12/12/2016 1150   CL 86 (L) 02/26/2023 0257   CL 103 07/27/2012 1259   CO2 31 02/26/2023 0257   CO2 31 (H) 12/12/2016 1150   GLUCOSE 142 (H) 02/26/2023 0257   GLUCOSE 149 (H) 12/12/2016 1150   GLUCOSE 118 (H) 07/27/2012 1259   BUN 25 (H) 02/26/2023 0257   BUN 28 (H) 01/18/2022 1150   BUN 23.4 12/12/2016 1150   CREATININE 1.91 (H) 02/26/2023 0257   CREATININE 2.07 (H) 02/13/2023 1119   CREATININE 2.31 (H) 07/23/2022 1413   CREATININE 1.7 (H) 12/12/2016 1150   CALCIUM  10.1 02/26/2023 0257   CALCIUM  11.4 (H) 12/12/2016 1150   PROT 6.0 (L) 02/13/2023 1119   PROT 6.5 12/12/2016 1150   ALBUMIN 3.9 02/13/2023 1119   ALBUMIN 3.9 12/12/2016 1150   AST 18 02/13/2023 1119   AST 19 12/12/2016 1150   ALT 12 02/13/2023 1119   ALT 10 12/12/2016 1150   ALKPHOS 59 02/13/2023 1119   ALKPHOS 65 12/12/2016 1150   BILITOT 0.4 02/13/2023 1119   BILITOT 0.36 12/12/2016 1150   GFRNONAA 26 (L) 02/26/2023 0257   GFRNONAA 24 (L) 02/13/2023 1119   GFRNONAA 21 (L) 09/20/2019 1506   GFRAA 25 (L) 09/20/2019 1506    CBC    Component Value Date/Time   WBC 6.8 10/07/2023 1032   RBC 3.51 (L) 10/07/2023 1032   HGB 9.8 (L) 10/07/2023 1032   HGB 9.6 (L) 02/13/2023 1119   HGB 9.0 (L) 01/18/2022 1150   HGB 10.9 (L) 03/21/2017 1221   HCT 29.1 (L) 10/07/2023 1032   HCT 29.4 (L) 01/18/2022 1150   HCT 35.9 03/21/2017 1221   PLT 177  10/07/2023 1032   PLT 156 02/13/2023 1119   PLT 164 01/18/2022 1150   MCV 82.9 10/07/2023 1032   MCV 90 01/18/2022 1150   MCV 82.5 03/21/2017 1221   MCH 27.9 10/07/2023 1032   MCHC 33.7 10/07/2023 1032   RDW 15.1 10/07/2023 1032   RDW 13.1 01/18/2022 1150   RDW 15.7 (H) 03/21/2017 1221   LYMPHSABS 1.2 10/07/2023 1032   LYMPHSABS 1.1 03/21/2017 1221   MONOABS 0.5 10/07/2023 1032   MONOABS 0.3 03/21/2017 1221   EOSABS 0.6 (H) 10/07/2023 1032   EOSABS 0.3 03/21/2017 1221   BASOSABS 0.0 10/07/2023 1032   BASOSABS 0.0 03/21/2017 1221    Lab Results  Component Value Date   VD25OH 76.06 02/13/2023    T-score: March 27, 2022 T-score -2.4, BMD 0.587 right femoral neck, no comparison, left one third radius T-score -0.60, BMD 0.659-1% change, right total femur 8% change.   Assessment/Plan:   Administrations This Visit     denosumab  (PROLIA ) injection 60 mg     Admin Date 10/10/2023 Action Given Dose 60 mg Route Subcutaneous Documented By Cena Alfonso CROME, LPN  Patient tolerated injection well.   Patient is to return in 10-14 days for labs to monitor for hypocalcemia.  Future orders placed.   All questions encouraged and answered.  Instructed patient to call with any further questions or concerns.

## 2023-10-13 MED ORDER — DENOSUMAB 60 MG/ML ~~LOC~~ SOSY: 60.0000 mg | PREFILLED_SYRINGE | SUBCUTANEOUS | Status: DC

## 2023-10-13 NOTE — Addendum Note (Signed)
 Addended by: DAYNE SHERRY RAMAN on: 10/13/2023 08:32 AM   Modules accepted: Orders

## 2023-10-14 DIAGNOSIS — R300 Dysuria: Secondary | ICD-10-CM | POA: Diagnosis not present

## 2023-10-14 DIAGNOSIS — I10 Essential (primary) hypertension: Secondary | ICD-10-CM | POA: Diagnosis not present

## 2023-10-14 DIAGNOSIS — E1165 Type 2 diabetes mellitus with hyperglycemia: Secondary | ICD-10-CM | POA: Diagnosis not present

## 2023-10-14 DIAGNOSIS — D631 Anemia in chronic kidney disease: Secondary | ICD-10-CM | POA: Diagnosis not present

## 2023-10-14 DIAGNOSIS — N184 Chronic kidney disease, stage 4 (severe): Secondary | ICD-10-CM | POA: Diagnosis not present

## 2023-10-15 ENCOUNTER — Telehealth: Payer: Self-pay | Admitting: Cardiology

## 2023-10-15 MED ORDER — METOPROLOL SUCCINATE ER 50 MG PO TB24: 50.0000 mg | ORAL_TABLET | Freq: Every day | ORAL | 1 refills | Status: DC

## 2023-10-15 NOTE — Telephone Encounter (Signed)
 PATIENT IS OUT OF MEDICATION   *STAT* If patient is at the pharmacy, call can be transferred to refill team.   1. Which medications need to be refilled? (please list name of each medication and dose if known) metoprolol  succinate (TOPROL -XL) 50 MG 24 hr tablet    2. Would you like to learn more about the convenience, safety, & potential cost savings by using the [facility]? NO     3. Are you open to using the [facility] NO   4. Which pharmacy/location (including street and city if local pharmacy) is medication to be sent to? [id number] - [place], [place] - [address]    5. Do they need a 30 day or 90 day supply? 90   Refill was being filled by Dr. Levern but patient is being followed by Dr. Elmira now.

## 2023-10-15 NOTE — Telephone Encounter (Signed)
 RX sent to requested Pharmacy

## 2023-10-22 ENCOUNTER — Telehealth: Payer: Self-pay

## 2023-10-22 ENCOUNTER — Encounter: Payer: Self-pay | Admitting: Cardiology

## 2023-10-22 NOTE — Telephone Encounter (Signed)
 Patients daughter called the office and stated that her mom was placed on colchicine  at her last visit and the pharmacy is concerned with her recent kidney functions and the patient taking the meds. Would like to know if this is something she should be concerned about. Recent labs are in [facility].  Please adivise.

## 2023-10-22 NOTE — Telephone Encounter (Signed)
 She is on colchicine  0.6 mg, half tablet by mouth every other day.  We cleared that dose from our pharmacist.

## 2023-10-22 NOTE — Telephone Encounter (Signed)
 Patient's grandson advised she is on colchicine  0.6 mg, half tablet by mouth every other day.  We cleared that dose from our pharmacist.

## 2023-10-23 DIAGNOSIS — N3010 Interstitial cystitis (chronic) without hematuria: Secondary | ICD-10-CM | POA: Diagnosis not present

## 2023-10-23 DIAGNOSIS — R350 Frequency of micturition: Secondary | ICD-10-CM | POA: Diagnosis not present

## 2023-10-23 NOTE — Telephone Encounter (Signed)
 We can take over these refills.  Thanks MJP

## 2023-10-24 MED ORDER — METOPROLOL SUCCINATE ER 50 MG PO TB24: 50.0000 mg | ORAL_TABLET | Freq: Every day | ORAL | 3 refills | Status: DC

## 2023-10-24 MED ORDER — ISOSORBIDE MONONITRATE ER 120 MG PO TB24: 120.0000 mg | ORAL_TABLET | Freq: Every morning | ORAL | 3 refills | Status: AC

## 2023-10-24 MED ORDER — ASPIRIN EC 81 MG PO TBEC: 81.0000 mg | DELAYED_RELEASE_TABLET | Freq: Every day | ORAL | 3 refills | Status: AC

## 2023-10-27 DIAGNOSIS — I8312 Varicose veins of left lower extremity with inflammation: Secondary | ICD-10-CM | POA: Diagnosis not present

## 2023-10-27 DIAGNOSIS — I8311 Varicose veins of right lower extremity with inflammation: Secondary | ICD-10-CM | POA: Diagnosis not present

## 2023-10-27 DIAGNOSIS — L853 Xerosis cutis: Secondary | ICD-10-CM | POA: Diagnosis not present

## 2023-10-28 DIAGNOSIS — B370 Candidal stomatitis: Secondary | ICD-10-CM | POA: Diagnosis not present

## 2023-10-28 DIAGNOSIS — R634 Abnormal weight loss: Secondary | ICD-10-CM | POA: Diagnosis not present

## 2023-10-31 DIAGNOSIS — E1122 Type 2 diabetes mellitus with diabetic chronic kidney disease: Secondary | ICD-10-CM | POA: Diagnosis not present

## 2023-11-03 ENCOUNTER — Encounter: Payer: Self-pay | Admitting: Cardiology

## 2023-11-03 ENCOUNTER — Other Ambulatory Visit (HOSPITAL_COMMUNITY): Payer: Self-pay

## 2023-11-03 ENCOUNTER — Telehealth: Payer: Self-pay | Admitting: Pharmacy Technician

## 2023-11-03 ENCOUNTER — Encounter: Payer: Self-pay | Admitting: Hematology and Oncology

## 2023-11-03 ENCOUNTER — Ambulatory Visit: Attending: Cardiology | Admitting: Cardiology

## 2023-11-03 VITALS — BP 116/48 | HR 54 | Ht 60.0 in | Wt 122.0 lb

## 2023-11-03 DIAGNOSIS — I10 Essential (primary) hypertension: Secondary | ICD-10-CM

## 2023-11-03 DIAGNOSIS — I2510 Atherosclerotic heart disease of native coronary artery without angina pectoris: Secondary | ICD-10-CM

## 2023-11-03 DIAGNOSIS — E782 Mixed hyperlipidemia: Secondary | ICD-10-CM | POA: Insufficient documentation

## 2023-11-03 MED ORDER — TOPROL XL 50 MG PO TB24
50.0000 mg | ORAL_TABLET | Freq: Every day | ORAL | 3 refills | Status: AC
  Filled 2023-11-03 – 2023-11-04 (×2): qty 90, 90d supply, fill #0

## 2023-11-03 NOTE — Patient Instructions (Signed)
 Medication Instructions:  CHANGED Metoprolol  to name brand   *If you need a refill on your cardiac medications before your next appointment, please call your pharmacy*  Lab Work: CBC BMP LIPID PANEL   If you have labs (blood work) drawn today and your tests are completely normal, you will receive your results only by: MyChart Message (if you have MyChart) OR A paper copy in the mail If you have any lab test that is abnormal or we need to change your treatment, we will call you to review the results.  Follow-Up: At [facility], you and your health needs are our priority.  As part of our continuing mission to provide you with exceptional heart care, our providers are all part of one team.  This team includes your primary Cardiologist (physician) and Advanced Practice Providers or APPs (Physician Assistants and Nurse Practitioners) who all work together to provide you with the care you need, when you need it.  Your next appointment:   6 month(s)  Provider:   Newman JINNY Lawrence, MD

## 2023-11-03 NOTE — Telephone Encounter (Signed)
 Pharmacy Patient Advocate Encounter   Received notification from Patient Pharmacy that prior authorization for Toprol  XL is required/requested.   Insurance verification completed.   The patient is insured through [facility] .   Per test claim: PA required; PA submitted to above mentioned insurance via Latent Key/confirmation #/EOC [id number] Status is pending

## 2023-11-03 NOTE — Progress Notes (Signed)
 Cardiology Office Note:  .   Date:  11/03/2023  ID:  Abigail Hoover, DOB Aug 05, 1945, MRN [id number] PCP: Regino Slater, MD  [facility] Providers Cardiologist:  Newman Lawrence, MD PCP: Regino Slater, MD  Chief Complaint  Patient presents with   Hypertension      History of Present Illness: .    Abigail Hoover is a 78 y.o. female with hypertension, type 2 diabetes mellitus, hypothyroidism, CKD stage 4, anemia, leg edema   Patient is doing well.  Leg edema has resolved, currently on torsemide 60 mg twice daily as per nephrology recommendations.  Reviewed recent PET/CT stress test results with the patient, details below.  Patient has not had any recent chest pain.  Vitals:   11/03/23 1103  BP: (!) 116/48  Pulse: (!) 54  SpO2: 99%      ROS:  Review of Systems  Cardiovascular:  Positive for chest pain. Negative for dyspnea on exertion, leg swelling, palpitations and syncope.     Studies Reviewed: Abigail Hoover        EKG 05/05/2023: Normal sinus rhythm Normal ECG When compared with ECG of 26-Aug-2022 15:09, No significant change was found  PET/CT stress test 05/2023:   The study has no evidence of epicardial disease. There is abnormal myocardial blood flow reserve; this can be seen in microvascular disease.  There is also high resting flow, which can be seen in CKD but may lead to false positives.  The study is likely an intermediate risk.   LV perfusion is normal. There is no evidence of ischemia. There is no evidence of infarction.   Rest left ventricular function is normal. Rest EF: 70%. Stress left ventricular function is normal. Stress EF: 73%. End diastolic cavity size is normal. End systolic cavity size is normal.   Myocardial blood flow was computed to be 1.45ml/g/min at rest and 1.51ml/g/min at stress. Global myocardial blood flow reserve was 1.47 and was abnormal.   Coronary calcium  was present on the attenuation correction CT images. Mild coronary  calcifications were present. Coronary calcifications were present in the left anterior descending artery and right coronary artery distribution(s). Aortic atherosclerosis.  Labs 02/2023: Na 127 Cr 1.91, eGFR 26   Physical Exam:   Physical Exam Vitals and nursing note reviewed.  Constitutional:      General: She is not in acute distress. Neck:     Vascular: No JVD.  Cardiovascular:     Rate and Rhythm: Normal rate and regular rhythm.     Heart sounds: Normal heart sounds. No murmur heard. Pulmonary:     Effort: Pulmonary effort is normal.     Breath sounds: Normal breath sounds. No wheezing or rales.  Musculoskeletal:     Right lower leg: No edema.     Left lower leg: No edema.      VISIT DIAGNOSES:   ICD-10-CM   1. Coronary artery disease involving native coronary artery of native heart without angina pectoris  I25.10 CBC    Basic metabolic panel with GFR    2. Mixed hyperlipidemia  E78.2 Lipid panel    3. Essential hypertension  I10         ASSESSMENT AND PLAN: .    Abigail Hoover is a 78 y.o. female with hypertension, type 2 diabetes mellitus, hypothyroidism, CKD stage 4, anemia, leg edema, now with possible angina symptoms   Chest pain: Now resolved. PET/CT stress test reassuring with absence of epicardial coronary artery disease.  There may be some  microvascular dysfunction, but symptoms improved on current medical management including beta-blocker and nitrates.  Continue same. Check lipid panel.  Hypertension: Controlled With her underlying CKD, check CBC, BMP today. Patient reports metoprolol  succinate Toprol  rather than generic.  Refilled 50 mg daily today.  Type 2 diabetes mellitus: Continue follow-up with endocrinology.      F/u in 6 months  Signed, Newman JINNY Lawrence, MD

## 2023-11-03 NOTE — Telephone Encounter (Signed)
 Pharmacy Patient Advocate Encounter  Received notification from [facility] that Prior Authorization for Toprol  XL 50MG  has been APPROVED from 11/03/23 to 11/02/24   PA #/Case ID/Reference #: [id number]

## 2023-11-04 ENCOUNTER — Other Ambulatory Visit (HOSPITAL_COMMUNITY): Payer: Self-pay

## 2023-11-04 LAB — CBC
Hematocrit: 35.6 % (ref 34.0–46.6)
Hemoglobin: 11 g/dL — ABNORMAL LOW (ref 11.1–15.9)
MCH: 27.8 pg (ref 26.6–33.0)
MCHC: 30.9 g/dL — ABNORMAL LOW (ref 31.5–35.7)
MCV: 90 fL (ref 79–97)
Platelets: 262 x10E3/uL (ref 150–450)
RBC: 3.96 x10E6/uL (ref 3.77–5.28)
RDW: 15 % (ref 11.7–15.4)
WBC: 6.9 x10E3/uL (ref 3.4–10.8)

## 2023-11-04 LAB — LIPID PANEL
Chol/HDL Ratio: 2.2 ratio (ref 0.0–4.4)
Cholesterol, Total: 164 mg/dL (ref 100–199)
HDL: 74 mg/dL (ref >39)
LDL Chol Calc (NIH): 76 mg/dL (ref 0–99)
Triglycerides: 76 mg/dL (ref 0–149)
VLDL Cholesterol Cal: 14 mg/dL (ref 5–40)

## 2023-11-04 LAB — BASIC METABOLIC PANEL WITH GFR
BUN/Creatinine Ratio: 20 (ref 12–28)
BUN: 65 mg/dL — ABNORMAL HIGH (ref 8–27)
CO2: 24 mmol/L (ref 20–29)
Calcium: 10.5 mg/dL — ABNORMAL HIGH (ref 8.7–10.3)
Chloride: 85 mmol/L — ABNORMAL LOW (ref 96–106)
Creatinine, Ser: 3.27 mg/dL — ABNORMAL HIGH (ref 0.57–1.00)
Glucose: 171 mg/dL — ABNORMAL HIGH (ref 70–99)
Potassium: 4.2 mmol/L (ref 3.5–5.2)
Sodium: 130 mmol/L — ABNORMAL LOW (ref 134–144)
eGFR: 14 mL/min/1.73 — ABNORMAL LOW (ref >59)

## 2023-11-05 ENCOUNTER — Other Ambulatory Visit (HOSPITAL_COMMUNITY): Payer: Self-pay

## 2023-11-05 ENCOUNTER — Ambulatory Visit: Payer: Self-pay | Admitting: Cardiology

## 2023-11-05 NOTE — Progress Notes (Signed)
 Hemoglobin is stable. Creatinine has increased from 1.91 in 02/2023 to 3.27 now. Patient is on a high dose of torsemide per nephrology recommendations, may need to consider reducing th dose. Recommend contacting the nephrologist at the earliest. If possible, please forward this note to [facility].  Thanks MJP

## 2023-11-06 ENCOUNTER — Other Ambulatory Visit (HOSPITAL_COMMUNITY): Payer: Self-pay

## 2023-11-07 DIAGNOSIS — N184 Chronic kidney disease, stage 4 (severe): Secondary | ICD-10-CM | POA: Diagnosis not present

## 2023-11-11 DIAGNOSIS — N952 Postmenopausal atrophic vaginitis: Secondary | ICD-10-CM | POA: Diagnosis not present

## 2023-11-13 DIAGNOSIS — N184 Chronic kidney disease, stage 4 (severe): Secondary | ICD-10-CM | POA: Diagnosis not present

## 2023-11-19 DIAGNOSIS — M7062 Trochanteric bursitis, left hip: Secondary | ICD-10-CM | POA: Diagnosis not present

## 2023-11-20 DIAGNOSIS — N184 Chronic kidney disease, stage 4 (severe): Secondary | ICD-10-CM | POA: Diagnosis not present

## 2023-11-26 DIAGNOSIS — R300 Dysuria: Secondary | ICD-10-CM | POA: Diagnosis not present

## 2023-11-26 DIAGNOSIS — N3010 Interstitial cystitis (chronic) without hematuria: Secondary | ICD-10-CM | POA: Diagnosis not present

## 2023-11-26 DIAGNOSIS — R350 Frequency of micturition: Secondary | ICD-10-CM | POA: Diagnosis not present

## 2023-11-28 ENCOUNTER — Other Ambulatory Visit: Payer: Self-pay | Admitting: Physician Assistant

## 2023-11-28 NOTE — Telephone Encounter (Signed)
 Last Fill: 10/10/2023  Labs: 10/07/2023 CBC RBC 3.51 Hemoglobin 9.8 HCT 29.1 Eosinophils Absolute 0.6 07/07/2023 CMP Sodium 135 Chloride 97 Glucose 176 BUN 26 Creatinine 1.94 eGFR 26 Total Protein 5.8  Next Visit: 04/16/2024  Last Visit: 10/10/2023  DX: Chondrocalcinosis   Current Dose per office note 10/10/2023: colchicine  0.3 mg daily   Okay to refill Colchicine ?

## 2023-12-01 DIAGNOSIS — E1122 Type 2 diabetes mellitus with diabetic chronic kidney disease: Secondary | ICD-10-CM | POA: Diagnosis not present

## 2023-12-03 DIAGNOSIS — K148 Other diseases of tongue: Secondary | ICD-10-CM | POA: Diagnosis not present

## 2023-12-04 DIAGNOSIS — E1142 Type 2 diabetes mellitus with diabetic polyneuropathy: Secondary | ICD-10-CM | POA: Diagnosis not present

## 2023-12-04 DIAGNOSIS — E039 Hypothyroidism, unspecified: Secondary | ICD-10-CM | POA: Diagnosis not present

## 2023-12-09 ENCOUNTER — Inpatient Hospital Stay (HOSPITAL_BASED_OUTPATIENT_CLINIC_OR_DEPARTMENT_OTHER): Admitting: Hematology and Oncology

## 2023-12-09 ENCOUNTER — Inpatient Hospital Stay

## 2023-12-09 ENCOUNTER — Inpatient Hospital Stay: Attending: Hematology and Oncology

## 2023-12-09 ENCOUNTER — Encounter: Payer: Self-pay | Admitting: Hematology and Oncology

## 2023-12-09 VITALS — BP 127/79 | HR 62 | Temp 98.4°F | Resp 18 | Ht 60.0 in | Wt 118.4 lb

## 2023-12-09 DIAGNOSIS — N183 Chronic kidney disease, stage 3 unspecified: Secondary | ICD-10-CM

## 2023-12-09 DIAGNOSIS — D631 Anemia in chronic kidney disease: Secondary | ICD-10-CM | POA: Insufficient documentation

## 2023-12-09 DIAGNOSIS — E538 Deficiency of other specified B group vitamins: Secondary | ICD-10-CM

## 2023-12-09 DIAGNOSIS — Z79899 Other long term (current) drug therapy: Secondary | ICD-10-CM | POA: Diagnosis not present

## 2023-12-09 LAB — VITAMIN B12: Vitamin B-12: 787 pg/mL (ref 180–914)

## 2023-12-09 LAB — IRON AND IRON BINDING CAPACITY (CC-WL,HP ONLY)
Iron: 80 ug/dL (ref 28–170)
Saturation Ratios: 27 % (ref 10.4–31.8)
TIBC: 300 ug/dL (ref 250–450)
UIBC: 220 ug/dL (ref 148–442)

## 2023-12-09 LAB — CBC WITH DIFFERENTIAL/PLATELET
Abs Immature Granulocytes: 0.04 K/uL (ref 0.00–0.07)
Basophils Absolute: 0 K/uL (ref 0.0–0.1)
Basophils Relative: 0 %
Eosinophils Absolute: 0.2 K/uL (ref 0.0–0.5)
Eosinophils Relative: 3 %
HCT: 32.3 % — ABNORMAL LOW (ref 36.0–46.0)
Hemoglobin: 10.6 g/dL — ABNORMAL LOW (ref 12.0–15.0)
Immature Granulocytes: 1 %
Lymphocytes Relative: 15 %
Lymphs Abs: 1.2 K/uL (ref 0.7–4.0)
MCH: 28.3 pg (ref 26.0–34.0)
MCHC: 32.8 g/dL (ref 30.0–36.0)
MCV: 86.4 fL (ref 80.0–100.0)
Monocytes Absolute: 0.5 K/uL (ref 0.1–1.0)
Monocytes Relative: 6 %
Neutro Abs: 6.4 K/uL (ref 1.7–7.7)
Neutrophils Relative %: 75 %
Platelets: 203 K/uL (ref 150–400)
RBC: 3.74 MIL/uL — ABNORMAL LOW (ref 3.87–5.11)
RDW: 16.8 % — ABNORMAL HIGH (ref 11.5–15.5)
WBC: 8.4 K/uL (ref 4.0–10.5)
nRBC: 0 % (ref 0.0–0.2)

## 2023-12-09 LAB — FERRITIN: Ferritin: 633 ng/mL — ABNORMAL HIGH (ref 11–307)

## 2023-12-09 MED ORDER — CYANOCOBALAMIN 1000 MCG/ML IJ SOLN
1000.0000 ug | Freq: Once | INTRAMUSCULAR | Status: AC
  Administered 2023-12-09: 1000 ug via INTRAMUSCULAR
  Filled 2023-12-09: qty 1

## 2023-12-09 MED ORDER — DARBEPOETIN ALFA 200 MCG/0.4ML IJ SOSY
200.0000 ug | PREFILLED_SYRINGE | Freq: Once | INTRAMUSCULAR | Status: AC
  Administered 2023-12-09: 200 ug via SUBCUTANEOUS
  Filled 2023-12-09: qty 0.4

## 2023-12-09 NOTE — Progress Notes (Signed)
   [facility] OFFICE PROGRESS NOTE  Koirala, Dibas, MD  ASSESSMENT & PLAN:  Assessment & Plan Anemia in stage 3 chronic kidney disease, unspecified whether stage 3a or 3b CKD (HCC) Her blood count is satisfactory and we will proceed with darbepoetin injection today I plan to see her back in 2 months for further follow-up We will continue ESA injection  Vitamin B 12 deficiency She has intermittent B12 deficiency Repeat B12 level in January was adequate She will continue vitamin B12 injection every 3 months, level is repeated today and pending    No orders of the defined types were placed in this encounter.   INTERVAL HISTORY: Patient returns for recurrent anemia Symptoms of anemia includes none We reviewed CBC result today  SUMMARY OF HEMATOLOGIC HISTORY:  This is a patient that has been followed here since 2011 for chronic anemia In 2011, she had a bone marrow aspirate and biopsy that came back normal. The patient has been receiving Aranesp  injection for chronic anemia up until 2014. It was subsequently discontinued due stability of the anemia. Starting 05/27/2014, darbepoetin was resumed at 200 g every other month to keep hemoglobin greater than 10 g. Treatment was discontinued in October 2016 and resumed in September 2017 The patient is subsequently found to have severe vitamin B12 deficiency and is started on vitamin B12 injection on December 25, 2016 along with resumption of darbepoetin injection   Lab Results  Component Value Date   VITAMINB12 403 04/11/2023   FERRITIN 352 (H) 11/28/2022   HGB 10.6 (L) 12/09/2023   RBC 3.74 (L) 12/09/2023   Vitals:   12/09/23 1028  BP: 127/79  Pulse: 62  Resp: 18  Temp: 98.4 F (36.9 C)  SpO2: 100%

## 2023-12-09 NOTE — Assessment & Plan Note (Addendum)
 She has intermittent B12 deficiency Repeat B12 level in January was adequate She will continue vitamin B12 injection every 3 months, level is repeated today and pending

## 2023-12-09 NOTE — Assessment & Plan Note (Addendum)
 Her blood count is satisfactory and we will proceed with darbepoetin injection today I plan to see her back in 2 months for further follow-up We will continue ESA injection

## 2023-12-11 DIAGNOSIS — N3941 Urge incontinence: Secondary | ICD-10-CM | POA: Diagnosis not present

## 2023-12-11 DIAGNOSIS — R300 Dysuria: Secondary | ICD-10-CM | POA: Diagnosis not present

## 2023-12-11 DIAGNOSIS — N952 Postmenopausal atrophic vaginitis: Secondary | ICD-10-CM | POA: Diagnosis not present

## 2023-12-14 ENCOUNTER — Other Ambulatory Visit: Payer: Self-pay | Admitting: Rheumatology

## 2023-12-17 DIAGNOSIS — N184 Chronic kidney disease, stage 4 (severe): Secondary | ICD-10-CM | POA: Diagnosis not present

## 2023-12-23 DIAGNOSIS — L439 Lichen planus, unspecified: Secondary | ICD-10-CM | POA: Diagnosis not present

## 2023-12-23 DIAGNOSIS — K148 Other diseases of tongue: Secondary | ICD-10-CM | POA: Diagnosis not present

## 2023-12-24 ENCOUNTER — Encounter: Payer: Self-pay | Admitting: Rheumatology

## 2023-12-24 DIAGNOSIS — N2581 Secondary hyperparathyroidism of renal origin: Secondary | ICD-10-CM | POA: Diagnosis not present

## 2023-12-24 DIAGNOSIS — I129 Hypertensive chronic kidney disease with stage 1 through stage 4 chronic kidney disease, or unspecified chronic kidney disease: Secondary | ICD-10-CM | POA: Diagnosis not present

## 2023-12-24 DIAGNOSIS — N184 Chronic kidney disease, stage 4 (severe): Secondary | ICD-10-CM | POA: Diagnosis not present

## 2023-12-24 DIAGNOSIS — D631 Anemia in chronic kidney disease: Secondary | ICD-10-CM | POA: Diagnosis not present

## 2023-12-25 NOTE — Telephone Encounter (Signed)
 Yes

## 2023-12-31 DIAGNOSIS — E1122 Type 2 diabetes mellitus with diabetic chronic kidney disease: Secondary | ICD-10-CM | POA: Diagnosis not present

## 2024-01-13 ENCOUNTER — Encounter: Payer: Self-pay | Admitting: Podiatry

## 2024-01-13 ENCOUNTER — Ambulatory Visit

## 2024-01-13 ENCOUNTER — Ambulatory Visit: Admitting: Podiatry

## 2024-01-13 DIAGNOSIS — M79671 Pain in right foot: Secondary | ICD-10-CM | POA: Diagnosis not present

## 2024-01-13 DIAGNOSIS — M79674 Pain in right toe(s): Secondary | ICD-10-CM

## 2024-01-13 DIAGNOSIS — E119 Type 2 diabetes mellitus without complications: Secondary | ICD-10-CM

## 2024-01-13 DIAGNOSIS — Z0189 Encounter for other specified special examinations: Secondary | ICD-10-CM | POA: Diagnosis not present

## 2024-01-13 DIAGNOSIS — L600 Ingrowing nail: Secondary | ICD-10-CM

## 2024-01-13 DIAGNOSIS — B351 Tinea unguium: Secondary | ICD-10-CM

## 2024-01-13 DIAGNOSIS — M79675 Pain in left toe(s): Secondary | ICD-10-CM | POA: Diagnosis not present

## 2024-01-13 DIAGNOSIS — E1142 Type 2 diabetes mellitus with diabetic polyneuropathy: Secondary | ICD-10-CM | POA: Diagnosis not present

## 2024-01-13 MED ORDER — MUPIROCIN 2 % EX OINT: TOPICAL_OINTMENT | CUTANEOUS | 1 refills | Status: AC

## 2024-01-13 NOTE — Progress Notes (Signed)
 Subjective:  Patient ID: Abigail Hoover, female    DOB: [birth year],  MRN: [id number]  Abigail Hoover presents to clinic today for for annual diabetic foot examination, at risk foot care with history of diabetic neuropathy, and painful thick toenails that are difficult to trim. Pain interferes with ambulation. Aggravating factors include wearing enclosed shoe gear. Pain is relieved with periodic professional debridement. Patient is accompanied by her grandson, [name]. Patient states her right great toe has been sore for some time. She denies any drainage or swelling, but it is tender when pressure is applied to medial border. Chief Complaint  Patient presents with   [facility]    [facility]  Diabetic A1c 6.4.  81 mg Asprin   New problem(s): None.   PCP is Koirala, Dibas, MD.  Allergies  Allergen Reactions   Atorvastatin Rash and Other (See Comments)   Ezetimibe Itching and Other (See Comments)    Review of Systems: Negative except as noted in the HPI.  Objective: No changes noted in today's physical examination. There were no vitals filed for this visit. Abigail Hoover is a pleasant 78 y.o. female WD, WN in NAD. AAO x 3.   Diabetic foot exam was performed with the following findings:   Vascular Examination: Capillary refill time immediate b/l. Vascular status intact b/l with palpable DP pulses; faintly palpable PT pulses. Pedal hair absent b/l. No pain with calf compression b/l. Skin temperature gradient WNL b/l. No cyanosis or clubbing noted b/l. No ischemia or gangrene b/l. Trace edema noted BLE.  Neurological Examination: Sensation grossly intact b/l with 10 gram monofilament. Vibratory sensation intact b/l. Pt has subjective symptoms of neuropathy.  Dermatological Examination: Pedal skin with normal turgor, texture and tone b/l.  No open wounds. No interdigital macerations.   Toenails left hallux and 2-5 b/l thick, discolored, elongated with subungual debris and pain on dorsal  palpation.   No hyperkeratotic nor porokeratotic lesions.  Incurvated nailplate medial border right hallux.  Nail border hypertrophy minimal. There is tenderness to palpation There is a break in the distal medial corner. Sign(s) of infection: no clinical signs of infection noted on examination today..  Musculoskeletal Examination: Muscle strength 5/5 to all lower extremity muscle groups bilaterally. No pain, crepitus or joint limitation noted with ROM bilateral LE. Pes planus deformity noted bilateral LE. Utilizes walker for ambulation assistance.  Radiographs:3 views right foot No gas in tissues right foot. No bone erosion noted distal phalanx right great toe. No evidence of fracture right foot.     Assessment/Plan: 1. Diabetic peripheral neuropathy associated with type 2 diabetes mellitus (HCC)   2. Pain due to onychomycosis of toenails of both feet   3. Ingrown right greater toenail   4. Encounter for diabetic foot exam (HCC)     Meds ordered this encounter  Medications   mupirocin ointment (BACTROBAN) 2 %    Sig: Apply to affected toe once daily.    Dispense:  30 g    Refill:  1  -Patient was evaluated today. All questions/concerns addressed on today's visit. -Patient's family member present. All questions/concerns addressed on today's visit. -Ordered xray right foot 3 views. -Patient to continue soft, supportive shoe gear daily. -No invasive procedure(s) performed. Offending nail border debrided and curretaged medial border right hallux utilizing sterile nail nipper and currette. Border(s) cleansed with alcohol and triple antibiotic ointment applied. Patient/POA/Caregiver/Facility instructed to apply Mupirocin Ointment  to right great toe once daily.. Call office if there are any concerns. -  Patient/POA to call should there be question/concern in the interim. Prescription written for Mupirocin Ointment. Patient is to apply to right great toe once daily and cover with dressing.  Patient will follow up with Dr. Tobie for follow up. -Toenails 2-5 bilaterally and left great toe debrided in length and girth without iatrogenic bleeding with sterile nail nipper and dremel.  -Patient/POA to call should there be question/concern in the interim.   Return in about 3 months (around 04/14/2024).  Abigail Hoover, DPM       LOCATION: [address] [place] [address]                   Office [phone]   [facility] LOCATION: [address] [place], [place] [address] Office [phone]

## 2024-01-13 NOTE — Patient Instructions (Signed)

## 2024-01-14 DIAGNOSIS — Z23 Encounter for immunization: Secondary | ICD-10-CM | POA: Diagnosis not present

## 2024-01-14 DIAGNOSIS — E1165 Type 2 diabetes mellitus with hyperglycemia: Secondary | ICD-10-CM | POA: Diagnosis not present

## 2024-01-14 DIAGNOSIS — M810 Age-related osteoporosis without current pathological fracture: Secondary | ICD-10-CM | POA: Diagnosis not present

## 2024-01-14 DIAGNOSIS — R600 Localized edema: Secondary | ICD-10-CM | POA: Diagnosis not present

## 2024-01-14 DIAGNOSIS — N184 Chronic kidney disease, stage 4 (severe): Secondary | ICD-10-CM | POA: Diagnosis not present

## 2024-01-14 DIAGNOSIS — Z1331 Encounter for screening for depression: Secondary | ICD-10-CM | POA: Diagnosis not present

## 2024-01-14 DIAGNOSIS — E1142 Type 2 diabetes mellitus with diabetic polyneuropathy: Secondary | ICD-10-CM | POA: Diagnosis not present

## 2024-01-14 DIAGNOSIS — E538 Deficiency of other specified B group vitamins: Secondary | ICD-10-CM | POA: Diagnosis not present

## 2024-01-14 DIAGNOSIS — Z0000 Encounter for general adult medical examination without abnormal findings: Secondary | ICD-10-CM | POA: Diagnosis not present

## 2024-01-14 DIAGNOSIS — I2510 Atherosclerotic heart disease of native coronary artery without angina pectoris: Secondary | ICD-10-CM | POA: Diagnosis not present

## 2024-01-14 DIAGNOSIS — I10 Essential (primary) hypertension: Secondary | ICD-10-CM | POA: Diagnosis not present

## 2024-01-14 LAB — LAB REPORT - SCANNED
A1c: 7.5
Creatinine, POC: 48 mg/dL
EGFR: 22
Microalb Creat Ratio: <14.7
Microalbumin, Urine: <0.7

## 2024-01-15 NOTE — Progress Notes (Signed)
 Office Visit Note  Patient: Abigail Hoover             Date of Birth: 01-06-1946           MRN: [id number]             PCP: Regino Slater, MD Referring: Regino Slater, MD Visit Date: 01/29/2024 Occupation: Data Unavailable  Subjective:  No chief complaint on file.   History of Present Illness: Abigail Hoover is a 78 y.o. female with osteoarthritis, degenerative disc disease, chondrocalcinosis and osteoporosis.  She returns today after her last visit in July 2025.  She states she continues to have pain and discomfort in her lower back.  She has not had any flares of pseudogout.  She has been getting Prolia  injections on a regular basis.  Her last Prolia  injection was on October 10, 2023.  She denies any side effects from Prolia  injections.  Has been taking vitamin D  on a regular basis.  She stays active all day.    Activities of Daily Living:  Patient reports morning stiffness for 1 hour.   Patient Denies nocturnal pain.  Difficulty dressing/grooming: Denies Difficulty climbing stairs: Denies Difficulty getting out of chair: Denies Difficulty using hands for taps, buttons, cutlery, and/or writing: Denies  Review of Systems  Constitutional:  Positive for fatigue.  HENT:  Negative for mouth sores and mouth dryness.   Eyes:  Negative for dryness.  Respiratory:  Negative for shortness of breath.   Cardiovascular:  Negative for chest pain and palpitations.  Gastrointestinal:  Positive for constipation. Negative for blood in stool and diarrhea.  Endocrine: Negative for increased urination.  Genitourinary:  Negative for involuntary urination.  Musculoskeletal:  Positive for joint pain, gait problem, joint pain, myalgias, morning stiffness and myalgias. Negative for joint swelling, muscle weakness and muscle tenderness.  Skin:  Positive for hair loss. Negative for color change, rash and sensitivity to sunlight.  Allergic/Immunologic: Negative for susceptible to infections.   Neurological:  Negative for dizziness and headaches.  Hematological:  Negative for swollen glands.  Psychiatric/Behavioral:  Positive for sleep disturbance. Negative for depressed mood. The patient is not nervous/anxious.     PMFS History:  Patient Active Problem List   Diagnosis Date Noted   Mixed hyperlipidemia 11/03/2023   Coronary artery disease involving native coronary artery of native heart without angina pectoris 05/05/2023   Leg edema 07/29/2022   Chronic idiopathic constipation 07/02/2022   Constipation 07/02/2022   Right lower quadrant pain 07/02/2022   Osteoarthritis of knee 03/06/2022   Preventive measure 01/31/2022   Retrosternal chest pain 01/16/2022   Stable angina 01/16/2022   Chest pain on breathing 12/07/2021   Inflammation of joint of both hands 10/26/2020   DDD (degenerative disc disease), lumbar 08/26/2019   Age-related osteoporosis without current pathological fracture 08/26/2019   Primary osteoarthritis of both knees 08/26/2019   Primary osteoarthritis of both hands 08/26/2019   Diabetes mellitus with chronic kidney disease, with long-term current use of insulin  (HCC) 05/11/2019   Pain due to onychomycosis of toenails of both feet 09/30/2018   Trigger thumb of right hand 09/02/2018   Chronic pain of right thumb 08/27/2018   Pain in finger of right hand 06/01/2018   Plantar fasciitis of left foot 05/19/2018   Lower limb pain, inferior, left 04/28/2018   Dry skin 03/12/2018   Neck pain 01/27/2018   Other fatigue 09/18/2017   Pain and swelling of right wrist 08/06/2017   At high risk for breast cancer  03/06/2017   Hypercalcemia 12/16/2016   Vitamin B 12 deficiency 12/16/2016   Displaced fracture of left femoral neck (HCC) 03/02/2016   Hyperkalemia 03/01/2016   Femoral neck fracture (HCC) 02/29/2016   Throat burning 01/23/2016   Anemia due to stage 3 (moderate) chronic renal failure treated with erythropoietin  (HCC) 12/18/2015   Chronic kidney disease,  stage III (moderate) (HCC) 08/14/2015   Anemia in chronic kidney disease 05/01/2015   Acute coronary syndrome (HCC) 01/02/2015   Lumbar spondylosis with myelopathy 06/27/2014   Deficiency anemia 05/27/2014   Carpal tunnel syndrome 12/09/2013   Diabetic neuropathy (HCC) 12/09/2013   Diabetic polyneuropathy associated with type 2 diabetes mellitus (HCC) 12/09/2013   Type 2 diabetes mellitus with stage 4 chronic kidney disease, with long-term current use of insulin  (HCC) 04/21/2013   Disturbance of skin sensation 04/21/2013   Hypothyroidism 10/30/2012   Breast mass in female-left 03/10/2012   Weight loss 11/28/2011   Increased frequency of urination 10/16/2011   Urinary urgency 10/16/2011   Pelvic pain in female 10/16/2011   Family history of breast cancer 08/01/2010   Essential hypertension 02/01/2009   EDEMA LEGS 02/01/2009    Past Medical History:  Diagnosis Date   Anemia in chronic kidney disease 05/01/2015   Anxiety    Arthritis    Bladder irritation    Diabetes mellitus    GERD (gastroesophageal reflux disease)    Hypercholesterolemia    Hypertension    Hypothyroidism    Iron  deficiency anemia, unspecified    Psoriasis    Transfusion history 03/05/2016   for postoperative (ORIF) anemia superimposed on chronic anemia    Family History  Problem Relation Age of Onset   Heart disease Mother        heart attack   Stroke Father        brain hemorrhage   Diabetes Brother    Breast cancer Sister    Cancer Sister        breast cancer   Past Surgical History:  Procedure Laterality Date   ABDOMINAL HYSTERECTOMY     TAH   BREAST BIOPSY     Left breast biopsy benign lesion   BREAST BIOPSY  03/11/2012   Procedure: BREAST BIOPSY;  Surgeon: Krystal JINNY Russell, MD;  Location: [facility] OR;  Service: General;  Laterality: Left;  remove left breast mass   BREAST EXCISIONAL BIOPSY Left    CARDIAC CATHETERIZATION N/A 01/02/2015   Procedure: Left Heart Cath and Coronary Angiography;   Surgeon: Rober Chroman, MD;  Location: [facility] INVASIVE CV LAB;  Service: Cardiovascular;  Laterality: N/A;   CATARACT EXTRACTION     CERVICAL DISC SURGERY     COLONOSCOPY  2013   neg. next 2023.    EYE SURGERY     IR KYPHO EA ADDL LEVEL THORACIC OR LUMBAR  09/09/2019   IR KYPHO LUMBAR INC FX REDUCE BONE BX UNI/BIL CANNULATION INC/IMAGING  09/09/2019   IR RADIOLOGIST EVAL & MGMT  09/08/2019   LEFT HIP SURGERY     TOTAL HIP ARTHROPLASTY Left 03/02/2016   Procedure: TOTAL HIP ARTHROPLASTY ANTERIOR APPROACH;  Surgeon: Redell Shoals, MD;  Location: [facility] OR;  Service: Orthopedics;  Laterality: Left;   Social History   Tobacco Use   Smoking status: Never    Passive exposure: Never   Smokeless tobacco: Never  Vaping Use   Vaping status: Never Used  Substance Use Topics   Alcohol use: No    Alcohol/week: 0.0 standard drinks of alcohol   Drug use: No  Social History   Social History Narrative   Patient is married with one child.   Patient is right handed.   Patient has college education.   Patient drinks tea, two times a day     Immunization History  Administered Date(s) Administered   Fluad Quad(high Dose 65+) 12/22/2018, 01/31/2022   INFLUENZA, HIGH DOSE SEASONAL PF 01/25/2016, 12/24/2016, 01/03/2018   Influenza Split 11/13/2010   Influenza,inj,Quad PF,6+ Mos 12/29/2012, 12/20/2013, 01/16/2015   Influenza,inj,quad, With Preservative 01/03/2018, 12/24/2018   Pneumococcal Conjugate-13 01/16/2015   Pneumococcal Polysaccharide-23 03/24/2013     Objective: Vital Signs: BP 112/63   Pulse 66   Temp 97.6 F (36.4 C)   Resp 13   Ht 5' (1.524 m)   Wt 121 lb (54.9 kg)   BMI 23.63 kg/m    Physical Exam Vitals and nursing note reviewed.  Constitutional:      Appearance: She is well-developed.  HENT:     Head: Normocephalic and atraumatic.  Eyes:     Conjunctiva/sclera: Conjunctivae normal.  Cardiovascular:     Rate and Rhythm: Normal rate and regular rhythm.     Heart sounds:  Normal heart sounds.  Pulmonary:     Effort: Pulmonary effort is normal.     Breath sounds: Normal breath sounds.  Abdominal:     General: Bowel sounds are normal.     Palpations: Abdomen is soft.  Musculoskeletal:     Cervical back: Normal range of motion.  Lymphadenopathy:     Cervical: No cervical adenopathy.  Skin:    General: Skin is warm and dry.     Capillary Refill: Capillary refill takes less than 2 seconds.  Neurological:     Mental Status: She is alert and oriented to person, place, and time.  Psychiatric:        Behavior: Behavior normal.      Musculoskeletal Exam: Patient was examined in the seated position.  She had some limitation with lateral rotation of the cervical spine.  She thoracic spine kyphosis without any tenderness.  She had tenderness in the lower lumbar region.  Shoulder joints, elbow joints, wrist joints, MCPs PIPs and DIPs were in good range of motion.  Mild PIP and DIP thickening was noted.  Hip joint could not be assessed in the seated position.  Knee joints with good range of motion.  As she had bilateral lower extremity pedal edema.  There was no tenderness over ankles or MTPs.  CDAI Exam: CDAI Score: -- Patient Global: --; Provider Global: -- Swollen: --; Tender: -- Joint Exam 01/29/2024   No joint exam has been documented for this visit   There is currently no information documented on the homunculus. Go to the Rheumatology activity and complete the homunculus joint exam.  Investigation: No additional findings.  Imaging: DG Foot Complete Right Result Date: 01/19/2024 Please see detailed radiograph report in office note.   Recent Labs: Lab Results  Component Value Date   WBC 8.4 12/09/2023   HGB 10.6 (L) 12/09/2023   PLT 203 12/09/2023   NA 130 (L) 11/03/2023   K 4.2 11/03/2023   CL 85 (L) 11/03/2023   CO2 24 11/03/2023   GLUCOSE 171 (H) 11/03/2023   BUN 65 (H) 11/03/2023   CREATININE 3.27 (H) 11/03/2023   BILITOT 0.4  02/13/2023   ALKPHOS 59 02/13/2023   AST 18 02/13/2023   ALT 12 02/13/2023   PROT 6.0 (L) 02/13/2023   ALBUMIN 3.9 02/13/2023   CALCIUM  10.5 (H) 11/03/2023  GFRAA 25 (L) 09/20/2019   January 14, 2024 GFR 22, hemoglobin A1c 7.5, urine protein creatinine ratio normal December 09, 2023 hemoglobin 10.6  Speciality Comments: Forteo  started 09/29/2019 Prolia  started 07/05/21  Procedures:  No procedures performed Allergies: Atorvastatin and Ezetimibe   Assessment / Plan:     Visit Diagnoses: Age-related osteoporosis with current pathological fracture, sequela - 04/23/22 T-score -2.4, BMD 0.587 right femoral neck, no comparison, left one third radius T-score -0.60, BMD 0.659-1% change, right total femur 8% change.  She will get repeat DEXA scan next year.  Her last Prolia  injection was on October 10, 2023.  She has been taking vitamin D .  Vitamin D  deficiency - February 13, 2023 vitamin D  76.06  Medication monitoring encounter - Forteo  on September 29, 2019 and completed on July 2022. Prolia  started on 07/05/2021, last dose: October 10, 2023.  She has had no side effects from Prolia .  Vertebral fracture, osteoporotic, sequela  Other secondary kyphosis, thoracolumbar region-chronic discomfort.  A handout on back exercises was given.  Displaced fracture of left femoral neck (HCC)  Degeneration of intervertebral disc of lumbar region without discogenic back pain or lower extremity pain-chronic lower back pain.  Patient requested a back brace.  I advised her to use back brace only for couple of hours while she is active.  Prescription for back brace was given.  Facet arthropathy, lumbar-she continues to have lower back pain.  A handout on back exercises was given.  She has tried physical therapy in the past.  Primary osteoarthritis of both hands-mild PIP and DIP thickening was noted.  Joint protection was discussed.  Primary osteoarthritis of both knees-she continues to have some discomfort in her knee  joints.  Chondrocalcinosis - On colchicine  0.3 mg p.o. every other day.  She denies having any flares of pseudogout.  Hyperuricemia  Psoriasis-she uses topical agents.  Essential hypertension-pressure was normal today.  History of diabetes mellitus  Hypothyroidism, unspecified type  Other diabetic neurological complication associated with type 2 diabetes mellitus (HCC)  Anemia due to stage 3 (moderate) chronic renal failure treated with erythropoietin  (HCC) - Followed by Dr. Tobie nephrology and hematology.  Orders: No orders of the defined types were placed in this encounter.  No orders of the defined types were placed in this encounter.    Follow-Up Instructions: Return in about 6 months (around 07/28/2024) for Osteoarthritis, Osteoporosis.   Maya Nash, MD  Note - This record has been created using Animal nutritionist.  Chart creation errors have been sought, but may not always  have been located. Such creation errors do not reflect on  the standard of medical care.

## 2024-01-19 DIAGNOSIS — N644 Mastodynia: Secondary | ICD-10-CM | POA: Diagnosis not present

## 2024-01-20 ENCOUNTER — Ambulatory Visit: Payer: Self-pay | Admitting: Cardiology

## 2024-01-20 ENCOUNTER — Other Ambulatory Visit: Payer: Self-pay | Admitting: Family Medicine

## 2024-01-20 ENCOUNTER — Telehealth: Payer: Self-pay

## 2024-01-20 DIAGNOSIS — N644 Mastodynia: Secondary | ICD-10-CM

## 2024-01-20 NOTE — Telephone Encounter (Signed)
 Labs received from: [place] [facility] on: 01/14/2024  Reviewed by: Dr. Dolphus  Labs drawn:  Hemoglobin A1c  CBC w/diff  CMP  Microalbumin panel  Vitamin b12  Vitamin D   Lipid panel w/ reflex     Results: A1c 7.5 RBC 3.99 HGB 11.6 HCT 35.3 RDW 17.9 Glucose 115 BUN 30 Creatinine 2.21 GFR 22 ALP 32 Vitamin D  112.7  Patient is on Prolia  every 6 months and colchicine  0.3mg  po every day.   PCP has advised of lab results and recommendations.   Copy of labs sent to the scan center after Dr. Dolphus reviewed.

## 2024-01-21 ENCOUNTER — Ambulatory Visit: Admitting: Podiatry

## 2024-01-21 DIAGNOSIS — L600 Ingrowing nail: Secondary | ICD-10-CM

## 2024-01-21 NOTE — Progress Notes (Signed)
 Subjective:  Patient ID: Abigail Hoover, female    DOB: 06/15/1945,  MRN: [id number]  Chief Complaint  Patient presents with   Nail Problem    Possible ingrown nail     78 y.o. female presents with the above complaint.  Patient presents with right hallux medial and lateral border ingrown painful to touch has progressed gotten worse worse with ambulation or shoe pressure she is a diabetic last A1c of 7.5%.  She wants to have it removed however she wants to make sure is safe with diabetes.  Is causing her some discomfort while ambulating.  Denies any other acute issues.  She wears tight toebox shoes   Review of Systems: Negative except as noted in the HPI. Denies N/V/F/Ch.  Past Medical History:  Diagnosis Date   Anemia in chronic kidney disease 05/01/2015   Anxiety    Arthritis    Bladder irritation    Diabetes mellitus    GERD (gastroesophageal reflux disease)    Hypercholesterolemia    Hypertension    Hypothyroidism    Iron  deficiency anemia, unspecified    Psoriasis    Transfusion history 03/05/2016   for postoperative (ORIF) anemia superimposed on chronic anemia    Current Outpatient Medications:    allopurinol (ZYLOPRIM) 100 MG tablet, Take 1 tablet by mouth daily., Disp: , Rfl:    amLODipine  (NORVASC ) 2.5 MG tablet, , Disp: , Rfl:    aspirin  EC 81 MG tablet, Take 1 tablet (81 mg total) by mouth daily., Disp: 90 tablet, Rfl: 3   colchicine  0.6 MG tablet, TAKE 1/2 TABLET BY MOUTH EVERY DAY, Disp: 45 tablet, Rfl: 0   Continuous Blood Gluc Sensor (DEXCOM G7 SENSOR) MISC, Inject 1 sensor to the skin every 10 days for continuous glucose monitoring., Disp: , Rfl:    Cyanocobalamin  (VITAMIN B-12 IJ), Inject as directed every 3 (three) months., Disp: , Rfl:    Darbepoetin Alfa  (ARANESP ) 60 MCG/0.3ML SOSY injection, Inject 60 mcg into the skin., Disp: , Rfl:    denosumab  (PROLIA ) 60 MG/ML SOSY injection, Inject 60 mg into the skin every 6 (six) months. Courier to rheum: [address], [address], [place] [address]. Appt 10/10/23, Disp: 1 mL, Rfl: 0   dexlansoprazole (DEXILANT) 60 MG capsule, as directed Orally per PCP, Disp: , Rfl:    docusate sodium  (COLACE) 100 MG capsule, Take 400 mg by mouth daily., Disp: , Rfl:    famotidine (PEPCID) 40 MG tablet, Take 40 mg by mouth 2 (two) times daily., Disp: , Rfl:    folic acid  (FOLVITE ) 1 MG tablet, 1 tablet Orally Once a day; Duration: 30 day(s), Disp: , Rfl:    furosemide  (LASIX ) 40 MG tablet, Take 40 mg by mouth 2 (two) times daily., Disp: , Rfl:    GEMTESA 75 MG TABS, Take 1 tablet by mouth daily., Disp: , Rfl:    glucose blood (ONETOUCH VERIO) test strip, Use Onetouch verio test strips to check blood sugar three times daily. (90 day RX), Disp: 300 each, Rfl: 2   hydrocortisone 2.5 % ointment, , Disp: , Rfl:    Insulin  Glargine (BASAGLAR  KWIKPEN) 100 UNIT/ML, Inject 2 Units into the skin daily., Disp: , Rfl:    Insulin  Pen Needle (PEN NEEDLES 3/16) 31G X 5 MM MISC, Use 1 pen needle to inject Forteo  daily., Disp: 100 each, Rfl: 2   Insulin  Pen Needle 31G X 5 MM MISC, Use with Novolog , Disp: 100 each, Rfl: 1   Insulin  Syringe-Needle U-100 25G  X 5/8 1 ML MISC, USE DAILY TO INJECT INSULIN , Disp: 100 each, Rfl: 3   isosorbide  mononitrate (IMDUR ) 120 MG 24 hr tablet, Take 1 tablet (120 mg total) by mouth every morning., Disp: 90 tablet, Rfl: 3   Lancet Devices (ONE TOUCH DELICA LANCING DEV) MISC, 1 each by Does not apply route daily. Use as instructed to check blood sugar once daily., Disp: 1 each, Rfl: 0   Lancets (ONETOUCH DELICA PLUS LANCET33G) MISC, 4 (four) times daily., Disp: , Rfl:    mupirocin ointment (BACTROBAN) 2 %, Apply to affected toe once daily., Disp: 30 g, Rfl: 1   nitroGLYCERIN  (NITROSTAT ) 0.4 MG SL tablet, PLACE 1 TAB UNDER TONGUE FOR CHEST PAIN EVERY 5 MINUTES     MAXIMUM OF 3 DOSES     CALL 911 AFTER FIRST DOSE., Disp: 25 tablet, Rfl: 0   NOVOLOG  FLEXPEN 100 UNIT/ML FlexPen, INJECT 9-11 UNITS UNDER THE  SKIN BEFORE the 3 meals (Patient taking differently: 6-7 Units. [id number] UNITS UNDER THE SKIN BEFORE the 3 meals), Disp: 30 mL, Rfl: 3   pantoprazole  (PROTONIX ) 40 MG tablet, Take 40 mg by mouth every morning., Disp: , Rfl:    pentosan polysulfate (ELMIRON ) 100 MG capsule, TAKE 1 CAPSULE (100 MG TOTAL) BY MOUTH 3 TIMES DAILY BEFORE MEALS., Disp: , Rfl:    polyethylene glycol (MIRALAX  / GLYCOLAX ) 17 g packet, as needed., Disp: , Rfl:    pravastatin  (PRAVACHOL ) 20 MG tablet, TAKE ONE TABLET BY MOUTH ONCE DAILY, Disp: 15 tablet, Rfl: 0   PREMARIN vaginal cream, 1 applicatorful Vaginal Once a day, Disp: , Rfl:    sitaGLIPtin  (JANUVIA ) 25 MG tablet, Take 25 mg by mouth daily., Disp: , Rfl:    sucralfate (CARAFATE) 1 g tablet, 1 tab(s) crush and mix in water 4 times a day (between meals and at bedtime) for 90 days, Disp: , Rfl:    SYNTHROID  75 MCG tablet, TAKE ONE TABLET BY MOUTH ONCE DAILY, Disp: 90 tablet, Rfl: 1   TOPROL  XL 50 MG 24 hr tablet, Take 1 tablet (50 mg total) by mouth daily. Take with or immediately following a meal., Disp: 90 tablet, Rfl: 3   torsemide (DEMADEX) 20 MG tablet, Take 1 tablet by mouth daily. (Patient taking differently: Take 60 mg by mouth 2 (two) times daily.), Disp: , Rfl:    triamcinolone  (KENALOG ) 0.025 % ointment, Apply 1 application  topically 2 (two) times daily., Disp: , Rfl:    trimethoprim  (TRIMPEX ) 100 MG tablet, 1 tablet Orally Once a day, Disp: , Rfl:   Current Facility-Administered Medications:    [START ON 04/10/2024] denosumab  (PROLIA ) injection 60 mg, 60 mg, Subcutaneous, Q6 months,   Social History   Tobacco Use  Smoking Status Never   Passive exposure: Never  Smokeless Tobacco Never    Allergies  Allergen Reactions   Atorvastatin Rash and Other (See Comments)   Ezetimibe Itching and Other (See Comments)   Objective:  There were no vitals filed for this visit. There is no height or weight on file to calculate BMI. Constitutional Well  developed. Well nourished.  Vascular Dorsalis pedis pulses palpable bilaterally. Posterior tibial pulses palpable bilaterally. Capillary refill normal to all digits.  No cyanosis or clubbing noted. Pedal hair growth normal.  Neurologic Normal speech. Oriented to person, place, and time. Epicritic sensation to light touch grossly present bilaterally.  Dermatologic Painful ingrowing nail at medial and lateral nail borders of the hallux nail right. No other open wounds. No skin lesions.  Orthopedic: Normal joint ROM without pain or crepitus bilaterally. No visible deformities. No bony tenderness.   Radiographs: None Assessment:   1. Ingrown toenail of right foot    Plan:  Patient was evaluated and treated and all questions answered.  Ingrown Nail, right - All questions or concerns were discussed with the patient in extensive detail given the presence of ingrown she will benefit from removal of the ingrowing nail to morning in the future.  However given her high A1c she wants to hold off on taking the ingrown nail.  I discussed conservative treatment options include shoe gear modification she states understands it continues to be bothersome and she will come back and see me and we will remove it.

## 2024-01-23 DIAGNOSIS — N184 Chronic kidney disease, stage 4 (severe): Secondary | ICD-10-CM | POA: Diagnosis not present

## 2024-01-23 DIAGNOSIS — E1142 Type 2 diabetes mellitus with diabetic polyneuropathy: Secondary | ICD-10-CM | POA: Diagnosis not present

## 2024-01-23 DIAGNOSIS — E039 Hypothyroidism, unspecified: Secondary | ICD-10-CM | POA: Diagnosis not present

## 2024-01-23 DIAGNOSIS — E1122 Type 2 diabetes mellitus with diabetic chronic kidney disease: Secondary | ICD-10-CM | POA: Diagnosis not present

## 2024-01-23 DIAGNOSIS — Z794 Long term (current) use of insulin: Secondary | ICD-10-CM | POA: Diagnosis not present

## 2024-01-29 ENCOUNTER — Ambulatory Visit: Attending: Rheumatology | Admitting: Rheumatology

## 2024-01-29 ENCOUNTER — Encounter: Payer: Self-pay | Admitting: Rheumatology

## 2024-01-29 ENCOUNTER — Telehealth: Payer: Self-pay | Admitting: Rheumatology

## 2024-01-29 VITALS — BP 112/63 | HR 66 | Temp 97.6°F | Resp 13 | Ht 60.0 in | Wt 121.0 lb

## 2024-01-29 DIAGNOSIS — M8008XS Age-related osteoporosis with current pathological fracture, vertebra(e), sequela: Secondary | ICD-10-CM | POA: Diagnosis not present

## 2024-01-29 DIAGNOSIS — E559 Vitamin D deficiency, unspecified: Secondary | ICD-10-CM | POA: Diagnosis not present

## 2024-01-29 DIAGNOSIS — M8000XS Age-related osteoporosis with current pathological fracture, unspecified site, sequela: Secondary | ICD-10-CM

## 2024-01-29 DIAGNOSIS — I10 Essential (primary) hypertension: Secondary | ICD-10-CM

## 2024-01-29 DIAGNOSIS — M4015 Other secondary kyphosis, thoracolumbar region: Secondary | ICD-10-CM

## 2024-01-29 DIAGNOSIS — M1120 Other chondrocalcinosis, unspecified site: Secondary | ICD-10-CM

## 2024-01-29 DIAGNOSIS — M810 Age-related osteoporosis without current pathological fracture: Secondary | ICD-10-CM

## 2024-01-29 DIAGNOSIS — E790 Hyperuricemia without signs of inflammatory arthritis and tophaceous disease: Secondary | ICD-10-CM

## 2024-01-29 DIAGNOSIS — M19042 Primary osteoarthritis, left hand: Secondary | ICD-10-CM

## 2024-01-29 DIAGNOSIS — M51369 Other intervertebral disc degeneration, lumbar region without mention of lumbar back pain or lower extremity pain: Secondary | ICD-10-CM

## 2024-01-29 DIAGNOSIS — E1149 Type 2 diabetes mellitus with other diabetic neurological complication: Secondary | ICD-10-CM

## 2024-01-29 DIAGNOSIS — D631 Anemia in chronic kidney disease: Secondary | ICD-10-CM

## 2024-01-29 DIAGNOSIS — N183 Chronic kidney disease, stage 3 unspecified: Secondary | ICD-10-CM

## 2024-01-29 DIAGNOSIS — M47816 Spondylosis without myelopathy or radiculopathy, lumbar region: Secondary | ICD-10-CM

## 2024-01-29 DIAGNOSIS — L409 Psoriasis, unspecified: Secondary | ICD-10-CM

## 2024-01-29 DIAGNOSIS — E039 Hypothyroidism, unspecified: Secondary | ICD-10-CM

## 2024-01-29 DIAGNOSIS — M170 Bilateral primary osteoarthritis of knee: Secondary | ICD-10-CM

## 2024-01-29 DIAGNOSIS — Z5181 Encounter for therapeutic drug level monitoring: Secondary | ICD-10-CM

## 2024-01-29 DIAGNOSIS — Z8639 Personal history of other endocrine, nutritional and metabolic disease: Secondary | ICD-10-CM

## 2024-01-29 DIAGNOSIS — S72002A Fracture of unspecified part of neck of left femur, initial encounter for closed fracture: Secondary | ICD-10-CM

## 2024-01-29 DIAGNOSIS — M19041 Primary osteoarthritis, right hand: Secondary | ICD-10-CM

## 2024-01-29 NOTE — Telephone Encounter (Signed)
 Patient forgot to mention to Dr. Dolphus that she was prescribed Prednisone by her endocrinologist last month to help with weight gain.

## 2024-01-29 NOTE — Patient Instructions (Signed)
 Thoracic Strain Rehab Ask your health care provider which exercises are safe for you. Do exercises exactly as told by your provider and adjust them as directed. It is normal to feel mild stretching, pulling, tightness, or discomfort as you do these exercises. Stop right away if you feel sudden pain or your pain gets worse. Do not begin these exercises until told by your provider. Stretching and range-of-motion exercise This exercise warms up your muscles and joints and improves the movement and flexibility of your back and shoulders. This exercise also helps to relieve pain. Chest and spine stretch  Lie down on your back on a firm surface. Roll a towel or a small blanket so it is about 4 inches (10 cm) in diameter. Put the towel under the middle of your back so it is under your spine, but not under your shoulder blades. Put your hands behind your head and let your elbows fall to your sides. This will increase your stretch. Take a deep breath (inhale). Hold for __________ seconds. Relax after you breathe out (exhale). Repeat __________ times. Complete this exercise __________ times a day. Strengthening exercises These exercises build strength and endurance in your back and your shoulder blade muscles. Endurance is the ability to use your muscles for a long time, even after they get tired. Alternating arm and leg raises  Get on your hands and knees on a firm surface. If you are on a hard floor, you may want to use padding, such as an exercise mat, to cushion your knees. Line up your arms and legs. Your hands should be directly below your shoulders, and your knees should be directly below your hips. Lift your left leg behind you. At the same time, raise your right arm and straighten it in front of you. Do not lift your leg higher than your hip. Do not lift your arm higher than your shoulder. Keep your abdominal and back muscles tight. Keep your hips facing the ground. Do not arch your  back. Carefully stay balanced. Do not hold your breath. Hold for __________ seconds. Slowly return to the starting position and repeat with your right leg and your left arm. Repeat __________ times. Complete this exercise __________ times a day. Straight arm rows This exercise is also called the shoulder extension exercise. Stand with your feet shoulder width apart. Secure an exercise band to a stable object in front of you so the band is at or above shoulder height. Hold one end of the exercise band in each hand. Straighten your elbows and lift your hands up to shoulder height. Step back, away from the secured end of the exercise band, until the band stretches. Squeeze your shoulder blades together and pull your hands down to the sides of your thighs. Stop when your hands are straight down by your sides. This is shoulder extension. Do not let your hands go behind your body. Hold for __________ seconds. Slowly return to the starting position. Repeat __________ times. Complete this exercise __________ times a day. Rowing scapular retraction This is an exercise in which the shoulder blades (scapulae) are pulled toward each other (retraction). Sit in a stable chair without armrests, or stand up. Secure an exercise band to a stable object in front of you so the band is at shoulder height. Hold one end of the exercise band in each hand. Your palms should face toward each other. Bring your arms out straight in front of you. Step back, away from the secured end of the  exercise band, until the band stretches. Pull the band backward. As you do this, bend your elbows and squeeze your shoulder blades together, but avoid letting the rest of your body move. Do not shrug your shoulders upward while you do this. Stop when your elbows are at your sides or slightly behind your body. Hold for __________ seconds. Slowly straighten your arms to return to the starting position. Repeat __________ times.  Complete this exercise __________ times a day. Posture and body mechanics Good posture and healthy body mechanics can help to relieve stress in your body's tissues and joints. Body mechanics refers to the movements and positions of your body while you do your daily activities. Posture is part of body mechanics. Good posture means: Your spine is in its natural S-curve position (neutral). Your shoulders are pulled back slightly. Your head is not tipped forward. Follow these guidelines to improve your posture and body mechanics in your everyday activities. Standing  When standing, keep your spine neutral and your feet about hip width apart. Keep a slight bend in your knees. Your ears, shoulders, and hips should line up with each other. When you do a task in which you lean forward while standing in one place for a long time, place one foot up on a stable object that is 2-4 inches (5-10 cm) high, such as a footstool. This helps keep your spine neutral. Sitting  When sitting, keep your spine neutral and keep your feet flat on the floor. Use a footrest if needed. Keep your thighs parallel to the floor. Avoid rounding your shoulders, and avoid tilting your head forward. When working at a desk or a computer, keep your desk at a height where your hands are slightly lower than your elbows. Slide your chair under your desk so you are close enough to maintain good posture. When working at a computer, place your monitor at a height where you are looking straight ahead and you do not have to tilt your head forward or downward to look at the screen. Resting When lying down and resting, avoid positions that are most painful for you. If you have pain with activities such as sitting, bending, stooping, or squatting (flexion-basedactivities), lie in a position in which your body does not bend very much. For example, avoid curling up on your side with your arms and knees near your chest (fetal position). If you have  pain with activities such as standing for a long time or reaching with your arms (extension-basedactivities), lie with your spine in a neutral position and bend your knees slightly. Try the following positions: Lie on your side with a pillow between your knees. Lie on your back with a pillow under your knees.  Lifting  When lifting objects, keep your feet at least shoulder width apart and tighten your abdominal muscles. Bend your knees and hips and keep your spine neutral. It is important to lift using the strength of your legs, not your back. Do not lock your knees straight out. Always ask for help to lift heavy or awkward objects. This information is not intended to replace advice given to you by your health care provider. Make sure you discuss any questions you have with your health care provider. Document Revised: 08/23/2022 Document Reviewed: 10/29/2021 Elsevier Patient Education  2024 [employer]  Low Back Sprain or Strain Rehab Ask your health care provider which exercises are safe for you. Do exercises exactly as told by your health care provider and adjust them as directed.  It is normal to feel mild stretching, pulling, tightness, or discomfort as you do these exercises. Stop right away if you feel sudden pain or your pain gets worse. Do not begin these exercises until told by your health care provider. Stretching and range-of-motion exercises These exercises warm up your muscles and joints and improve the movement and flexibility of your back. These exercises also help to relieve pain, numbness, and tingling. Lumbar rotation  Lie on your back on a firm bed or the floor with your knees bent. Straighten your arms out to your sides so each arm forms a 90-degree angle (right angle) with a side of your body. Slowly move (rotate) both of your knees to one side of your body until you feel a stretch in your lower back (lumbar). Try not to let your shoulders lift off the floor. Hold this  position for __________ seconds. Tense your abdominal muscles and slowly move your knees back to the starting position. Repeat this exercise on the other side of your body. Repeat __________ times. Complete this exercise __________ times a day. Single knee to chest  Lie on your back on a firm bed or the floor with both legs straight. Bend one of your knees. Use your hands to move your knee up toward your chest until you feel a gentle stretch in your lower back and buttock. Hold your leg in this position by holding on to the front of your knee. Keep your other leg as straight as possible. Hold this position for __________ seconds. Slowly return to the starting position. Repeat with your other leg. Repeat __________ times. Complete this exercise __________ times a day. Prone extension on elbows  Lie on your abdomen on a firm bed or the floor (prone position). Prop yourself up on your elbows. Use your arms to help lift your chest up until you feel a gentle stretch in your abdomen and your lower back. This will place some of your body weight on your elbows. If this is uncomfortable, try stacking pillows under your chest. Your hips should stay down, against the surface that you are lying on. Keep your hip and back muscles relaxed. Hold this position for __________ seconds. Slowly relax your upper body and return to the starting position. Repeat __________ times. Complete this exercise __________ times a day. Strengthening exercises These exercises build strength and endurance in your back. Endurance is the ability to use your muscles for a long time, even after they get tired. Pelvic tilt This exercise strengthens the muscles that lie deep in the abdomen. Lie on your back on a firm bed or the floor with your legs extended. Bend your knees so they are pointing toward the ceiling and your feet are flat on the floor. Tighten your lower abdominal muscles to press your lower back against the  floor. This motion will tilt your pelvis so your tailbone points up toward the ceiling instead of pointing to your feet or the floor. To help with this exercise, you may place a small towel under your lower back and try to push your back into the towel. Hold this position for __________ seconds. Let your muscles relax completely before you repeat this exercise. Repeat __________ times. Complete this exercise __________ times a day. Alternating arm and leg raises  Get on your hands and knees on a firm surface. If you are on a hard floor, you may want to use padding, such as an exercise mat, to cushion your knees. Line up your  arms and legs. Your hands should be directly below your shoulders, and your knees should be directly below your hips. Lift your left leg behind you. At the same time, raise your right arm and straighten it in front of you. Do not lift your leg higher than your hip. Do not lift your arm higher than your shoulder. Keep your abdominal and back muscles tight. Keep your hips facing the ground. Do not arch your back. Keep your balance carefully, and do not hold your breath. Hold this position for __________ seconds. Slowly return to the starting position. Repeat with your right leg and your left arm. Repeat __________ times. Complete this exercise __________ times a day. Abdominal set with straight leg raise  Lie on your back on a firm bed or the floor. Bend one of your knees and keep your other leg straight. Tense your abdominal muscles and lift your straight leg up, 4-6 inches (10-15 cm) off the ground. Keep your abdominal muscles tight and hold this position for __________ seconds. Do not hold your breath. Do not arch your back. Keep it flat against the ground. Keep your abdominal muscles tense as you slowly lower your leg back to the starting position. Repeat with your other leg. Repeat __________ times. Complete this exercise __________ times a day. Single leg lower  with bent knees Lie on your back on a firm bed or the floor. Tense your abdominal muscles and lift your feet off the floor, one foot at a time, so your knees and hips are bent in 90-degree angles (right angles). Your knees should be over your hips and your lower legs should be parallel to the floor. Keeping your abdominal muscles tense and your knee bent, slowly lower one of your legs so your toe touches the ground. Lift your leg back up to return to the starting position. Do not hold your breath. Do not let your back arch. Keep your back flat against the ground. Repeat with your other leg. Repeat __________ times. Complete this exercise __________ times a day. Posture and body mechanics Good posture and healthy body mechanics can help to relieve stress in your body's tissues and joints. Body mechanics refers to the movements and positions of your body while you do your daily activities. Posture is part of body mechanics. Good posture means: Your spine is in its natural S-curve position (neutral). Your shoulders are pulled back slightly. Your head is not tipped forward (neutral). Follow these guidelines to improve your posture and body mechanics in your everyday activities. Standing  When standing, keep your spine neutral and your feet about hip-width apart. Keep a slight bend in your knees. Your ears, shoulders, and hips should line up. When you do a task in which you stand in one place for a long time, place one foot up on a stable object that is 2-4 inches (5-10 cm) high, such as a footstool. This helps keep your spine neutral. Sitting  When sitting, keep your spine neutral and keep your feet flat on the floor. Use a footrest, if necessary, and keep your thighs parallel to the floor. Avoid rounding your shoulders, and avoid tilting your head forward. When working at a desk or a computer, keep your desk at a height where your hands are slightly lower than your elbows. Slide your chair under  your desk so you are close enough to maintain good posture. When working at a computer, place your monitor at a height where you are looking straight ahead and  you do not have to tilt your head forward or downward to look at the screen. Resting When lying down and resting, avoid positions that are most painful for you. If you have pain with activities such as sitting, bending, stooping, or squatting, lie in a position in which your body does not bend very much. For example, avoid curling up on your side with your arms and knees near your chest (fetal position). If you have pain with activities such as standing for a long time or reaching with your arms, lie with your spine in a neutral position and bend your knees slightly. Try the following positions: Lying on your side with a pillow between your knees. Lying on your back with a pillow under your knees. Lifting  When lifting objects, keep your feet at least shoulder-width apart and tighten your abdominal muscles. Bend your knees and hips and keep your spine neutral. It is important to lift using the strength of your legs, not your back. Do not lock your knees straight out. Always ask for help to lift heavy or awkward objects. This information is not intended to replace advice given to you by your health care provider. Make sure you discuss any questions you have with your health care provider. Document Revised: 07/15/2022 Document Reviewed: 05/29/2020 Elsevier Patient Education  2024 [employer].

## 2024-01-29 NOTE — Telephone Encounter (Signed)
 Did not see prednisone listed when reconciling medications nor under the fill history. But please see message below from the patient.

## 2024-01-31 DIAGNOSIS — E1122 Type 2 diabetes mellitus with diabetic chronic kidney disease: Secondary | ICD-10-CM | POA: Diagnosis not present

## 2024-02-03 ENCOUNTER — Telehealth: Payer: Self-pay | Admitting: Rheumatology

## 2024-02-03 ENCOUNTER — Inpatient Hospital Stay: Admitting: Hematology and Oncology

## 2024-02-03 ENCOUNTER — Inpatient Hospital Stay

## 2024-02-03 ENCOUNTER — Inpatient Hospital Stay: Attending: Hematology and Oncology

## 2024-02-03 ENCOUNTER — Encounter: Payer: Self-pay | Admitting: Hematology and Oncology

## 2024-02-03 VITALS — BP 144/67 | HR 69 | Temp 98.1°F | Resp 18 | Ht 60.0 in | Wt 121.4 lb

## 2024-02-03 DIAGNOSIS — Z79899 Other long term (current) drug therapy: Secondary | ICD-10-CM | POA: Diagnosis not present

## 2024-02-03 DIAGNOSIS — E538 Deficiency of other specified B group vitamins: Secondary | ICD-10-CM | POA: Diagnosis not present

## 2024-02-03 DIAGNOSIS — D631 Anemia in chronic kidney disease: Secondary | ICD-10-CM | POA: Diagnosis not present

## 2024-02-03 DIAGNOSIS — N183 Chronic kidney disease, stage 3 unspecified: Secondary | ICD-10-CM

## 2024-02-03 LAB — CBC WITH DIFFERENTIAL/PLATELET
Abs Immature Granulocytes: 0.02 K/uL (ref 0.00–0.07)
Basophils Absolute: 0 K/uL (ref 0.0–0.1)
Basophils Relative: 0 %
Eosinophils Absolute: 0.4 K/uL (ref 0.0–0.5)
Eosinophils Relative: 7 %
HCT: 31.5 % — ABNORMAL LOW (ref 36.0–46.0)
Hemoglobin: 10.4 g/dL — ABNORMAL LOW (ref 12.0–15.0)
Immature Granulocytes: 0 %
Lymphocytes Relative: 19 %
Lymphs Abs: 1.2 K/uL (ref 0.7–4.0)
MCH: 29 pg (ref 26.0–34.0)
MCHC: 33 g/dL (ref 30.0–36.0)
MCV: 87.7 fL (ref 80.0–100.0)
Monocytes Absolute: 0.4 K/uL (ref 0.1–1.0)
Monocytes Relative: 6 %
Neutro Abs: 4.4 K/uL (ref 1.7–7.7)
Neutrophils Relative %: 68 %
Platelets: 200 K/uL (ref 150–400)
RBC: 3.59 MIL/uL — ABNORMAL LOW (ref 3.87–5.11)
RDW: 15.9 % — ABNORMAL HIGH (ref 11.5–15.5)
WBC: 6.4 K/uL (ref 4.0–10.5)
nRBC: 0 % (ref 0.0–0.2)

## 2024-02-03 MED ORDER — DARBEPOETIN ALFA 200 MCG/0.4ML IJ SOSY
200.0000 ug | PREFILLED_SYRINGE | Freq: Once | INTRAMUSCULAR | Status: AC
  Administered 2024-02-03: 200 ug via SUBCUTANEOUS

## 2024-02-03 NOTE — Progress Notes (Signed)
   [facility] OFFICE PROGRESS NOTE  Koirala, Dibas, MD  ASSESSMENT & PLAN:  Assessment & Plan Anemia in stage 3 chronic kidney disease, unspecified whether stage 3a or 3b CKD (HCC) Her blood count is satisfactory and we will proceed with darbepoetin injection today I plan to see her back in 2 months for further follow-up We will continue ESA injection  Vitamin B 12 deficiency She has intermittent B12 deficiency Repeat B12 level in January was adequate She will continue vitamin B12 injection every 3 months    No orders of the defined types were placed in this encounter.   INTERVAL HISTORY: Patient returns for recurrent anemia Symptoms of anemia includes none We reviewed CBC result  SUMMARY OF HEMATOLOGIC HISTORY:   This is a patient that has been followed here since 2011 for chronic anemia In 2011, she had a bone marrow aspirate and biopsy that came back normal. The patient has been receiving Aranesp  injection for chronic anemia up until 2014. It was subsequently discontinued due stability of the anemia. Starting 05/27/2014, darbepoetin was resumed at 200 g every other month to keep hemoglobin greater than 10 g. Treatment was discontinued in October 2016 and resumed in September 2017 The patient is subsequently found to have severe vitamin B12 deficiency and is started on vitamin B12 injection on December 25, 2016 along with resumption of darbepoetin injection   Lab Results  Component Value Date   VITAMINB12 787 12/09/2023   FERRITIN 633 (H) 12/09/2023   HGB 10.4 (L) 02/03/2024   RBC 3.59 (L) 02/03/2024   Vitals:   02/03/24 1047  BP: (!) 144/67  Pulse: 69  Resp: 18  Temp: 98.1 F (36.7 C)  SpO2: 100%

## 2024-02-03 NOTE — Assessment & Plan Note (Addendum)
 Her blood count is satisfactory and we will proceed with darbepoetin injection today I plan to see her back in 2 months for further follow-up We will continue ESA injection

## 2024-02-03 NOTE — Telephone Encounter (Signed)
 [facility] called needing notes for the back brace that was prescribed. Gave the number [phone] press option one for further questions.

## 2024-02-03 NOTE — Telephone Encounter (Signed)
 [facility] and patient has taken the back brace rx there. They are requesting the most recent office note. Office note from 01/29/2024 has been faxed to [phone].

## 2024-02-03 NOTE — Assessment & Plan Note (Addendum)
 She has intermittent B12 deficiency Repeat B12 level in January was adequate She will continue vitamin B12 injection every 3 months

## 2024-02-10 ENCOUNTER — Encounter: Payer: Self-pay | Admitting: Family Medicine

## 2024-02-11 ENCOUNTER — Encounter: Payer: Self-pay | Admitting: Hematology and Oncology

## 2024-02-11 ENCOUNTER — Ambulatory Visit

## 2024-02-11 ENCOUNTER — Ambulatory Visit
Admission: RE | Admit: 2024-02-11 | Discharge: 2024-02-11 | Disposition: A | Discharge status: Home / Self Care | Source: Ambulatory Visit | Attending: Family Medicine | Admitting: Family Medicine

## 2024-02-11 DIAGNOSIS — N644 Mastodynia: Secondary | ICD-10-CM | POA: Diagnosis not present

## 2024-02-12 ENCOUNTER — Telehealth: Payer: Self-pay | Admitting: Rheumatology

## 2024-02-12 NOTE — Telephone Encounter (Signed)
 [facility] called [phone] option 1, to see if we received paper work for Dr. Dolphus to sign for pts back brace.

## 2024-02-12 NOTE — Telephone Encounter (Signed)
 Returned the call to [facility] and advised that we have not received any paperwork requiring a signature. They will re-fax.

## 2024-02-13 ENCOUNTER — Encounter: Payer: Self-pay | Admitting: Podiatry

## 2024-02-16 DIAGNOSIS — M5106 Intervertebral disc disorders with myelopathy, lumbar region: Secondary | ICD-10-CM | POA: Diagnosis not present

## 2024-02-17 DIAGNOSIS — N390 Urinary tract infection, site not specified: Secondary | ICD-10-CM | POA: Diagnosis not present

## 2024-02-17 DIAGNOSIS — I10 Essential (primary) hypertension: Secondary | ICD-10-CM | POA: Diagnosis not present

## 2024-02-17 DIAGNOSIS — N184 Chronic kidney disease, stage 4 (severe): Secondary | ICD-10-CM | POA: Diagnosis not present

## 2024-02-23 DIAGNOSIS — L2989 Other pruritus: Secondary | ICD-10-CM | POA: Diagnosis not present

## 2024-02-23 DIAGNOSIS — L219 Seborrheic dermatitis, unspecified: Secondary | ICD-10-CM | POA: Diagnosis not present

## 2024-02-23 DIAGNOSIS — L438 Other lichen planus: Secondary | ICD-10-CM | POA: Diagnosis not present

## 2024-03-09 DIAGNOSIS — R300 Dysuria: Secondary | ICD-10-CM | POA: Diagnosis not present

## 2024-03-09 DIAGNOSIS — N3941 Urge incontinence: Secondary | ICD-10-CM | POA: Diagnosis not present

## 2024-03-09 DIAGNOSIS — N952 Postmenopausal atrophic vaginitis: Secondary | ICD-10-CM | POA: Diagnosis not present

## 2024-03-10 ENCOUNTER — Ambulatory Visit: Admitting: Podiatry

## 2024-03-22 ENCOUNTER — Other Ambulatory Visit (HOSPITAL_COMMUNITY): Payer: Self-pay

## 2024-03-22 ENCOUNTER — Other Ambulatory Visit: Payer: Self-pay | Admitting: Pharmacy Technician

## 2024-03-22 ENCOUNTER — Telehealth: Payer: Self-pay | Admitting: Pharmacist

## 2024-03-22 ENCOUNTER — Other Ambulatory Visit: Payer: Self-pay

## 2024-03-22 DIAGNOSIS — M8000XS Age-related osteoporosis with current pathological fracture, unspecified site, sequela: Secondary | ICD-10-CM

## 2024-03-22 MED ORDER — JUBBONTI 60 MG/ML ~~LOC~~ SOSY
60.0000 mg | PREFILLED_SYRINGE | SUBCUTANEOUS | 0 refills | Status: AC
  Filled 2024-03-22: qty 1, 180d supply, fill #0

## 2024-03-22 NOTE — Progress Notes (Signed)
 Specialty Pharmacy Refill Coordination Note  Abigail Hoover is a 78 y.o. female assessed today regarding refills of clinic administered specialty medication(s) Denosumab -bernett (Jubbonti)   Clinic requested Delivery   Delivery date: 03/23/24   Verified address: Courier to rheum: [address], [place] [address]   Medication will be filled on: 03/22/24

## 2024-03-22 NOTE — Telephone Encounter (Signed)
 Patient due for denosumab  on 04/07/2024. Test claim shows patient is in catastrophic. Rx for JUBBONTI sent to [facility] to be couriered to office

## 2024-03-23 ENCOUNTER — Other Ambulatory Visit: Payer: Self-pay

## 2024-03-23 ENCOUNTER — Other Ambulatory Visit (HOSPITAL_COMMUNITY): Payer: Self-pay

## 2024-03-23 ENCOUNTER — Telehealth: Payer: Self-pay | Admitting: Cardiology

## 2024-03-23 NOTE — Telephone Encounter (Addendum)
 I talked to patient's daughter [name] about switch and that insurance will not cover Prolia . They can pay cash price with is $2500. She states she will discuss with patient.  She does not want to make any changes to her medications due to her fragile state. I advised that we will not be able to get Prolia  approved as it is off of formulary. Reviewed that biosimilars are approved after showing they are as effective and no more harmful than originator product.  Advised that the reason we picked Prolia  for patient's osteoporosis treatment is because of her kidney disease and we feel comfortable with switch as it is a biosimilar and clinically equivalent to Prolia . Also advised that she can reach out to nephrologist if needed.  She will speak with patient and call us  back. If they move forward with paying cash price, then she is aware that pharmacy will need to collect payment BEFORE sending prescription to clinic.  Sherry Pennant, PharmD, MPH, BCPS, CPP Clinical Pharmacist

## 2024-03-23 NOTE — Telephone Encounter (Signed)
 Patient's daughter contacted the office stating that they do not want the Biosimilar. They are wanting the brand name Prolia . They are willing to pay out of pocket for the medication.

## 2024-03-23 NOTE — Telephone Encounter (Signed)
 Abigail Hoover delivered to office today.

## 2024-03-23 NOTE — Telephone Encounter (Signed)
 Attempted to contact the patient's daughter [name] and left message for her to call the office to schedule the patient's Jubbonti.

## 2024-03-23 NOTE — Telephone Encounter (Signed)
 Spoke with Dr. Elmira and he advised if patient experiences chest pain to try nitroglycerin  and emergency room evaluation. Pt has been contacted and advised. Per Dr. Elmira pt is scheduled for 03/31/24 at 1115 and pt agrees.

## 2024-03-23 NOTE — Telephone Encounter (Signed)
 Pt c/o of Chest Pain: STAT if active (IN THIS MOMENT) CP, including tightness, pressure, jaw pain, shoulder/upper arm/back pain, SOB, nausea, and vomiting.  1. Are you having CP right now (tightness, pressure, or discomfort)? N?A  2. Are you experiencing any other symptoms (ex. SOB, nausea, vomiting, sweating)? No  3. How long have you been experiencing CP? A month  4. Is your CP continuous or coming and going? Coming and going   5. Have you taken Nitroglycerin ? No  6. If CP returns before callback, please consider calling 911. ?

## 2024-03-23 NOTE — Telephone Encounter (Signed)
 Patient identification verified by 2 forms.   Called and spoke to patient  Patient states:  -Chest heaviness sometimes  -Usually in the morning after 10am -Last felt heaviness 2-3 days ago.   -Last between 5-15 minutes.   Patient denies:  -Other symptoms -Using anything to relieve the heaviness -Taking blood pressure/heart rate at home.   Interventions/Plan: -Looked for appointment, pt only wants to see Dr. Elmira. First available is in March.  -Offered an APP appointment, pt only wants to see Dr. Elmira.  -Will get message to Dr. Elmira and his covering to address.    Reviewed ED warning signs/precautions  Patient agrees with plan, no questions at this time

## 2024-03-30 NOTE — Telephone Encounter (Signed)
 Attempted to contact [name] and left message for her to call the office. Calling to follow up if they have made a decision to proceed with the bio-similar Jubbonti  or to pay out of pocket for the Prolia .

## 2024-03-31 ENCOUNTER — Ambulatory Visit: Admitting: Cardiology

## 2024-04-01 ENCOUNTER — Ambulatory Visit: Attending: Cardiology | Admitting: Cardiology

## 2024-04-01 ENCOUNTER — Encounter: Payer: Self-pay | Admitting: Cardiology

## 2024-04-01 VITALS — BP 135/66 | HR 60 | Resp 17 | Ht 60.0 in | Wt 116.0 lb

## 2024-04-01 DIAGNOSIS — I25118 Atherosclerotic heart disease of native coronary artery with other forms of angina pectoris: Secondary | ICD-10-CM | POA: Diagnosis not present

## 2024-04-01 DIAGNOSIS — N184 Chronic kidney disease, stage 4 (severe): Secondary | ICD-10-CM | POA: Insufficient documentation

## 2024-04-01 DIAGNOSIS — I10 Essential (primary) hypertension: Secondary | ICD-10-CM

## 2024-04-01 MED ORDER — AMLODIPINE BESYLATE 5 MG PO TABS: 5.0000 mg | ORAL_TABLET | Freq: Every day | ORAL | 3 refills | Status: AC

## 2024-04-01 MED ORDER — NITROGLYCERIN 0.4 MG SL SUBL: SUBLINGUAL_TABLET | SUBLINGUAL | 2 refills | Status: AC

## 2024-04-01 NOTE — Patient Instructions (Signed)
 Medication Instructions:  INCREASE Amlodipine  to 5 mg daily   *If you need a refill on your cardiac medications before your next appointment, please call your pharmacy*   Follow-Up: At [facility], you and your health needs are our priority.  As part of our continuing mission to provide you with exceptional heart care, our providers are all part of one team.  This team includes your primary Cardiologist (physician) and Advanced Practice Providers or APPs (Physician Assistants and Nurse Practitioners) who all work together to provide you with the care you need, when you need it.  Your next appointment:   3 month(s)  Provider:   Newman JINNY Lawrence, MD

## 2024-04-01 NOTE — Progress Notes (Signed)
 " Cardiology Office Note:  .   Date:  04/01/2024  ID:  Abigail Hoover, DOB 08-29-1945, MRN [id number] PCP: Abigail Slater, MD  [facility] Providers Cardiologist:  Abigail Lawrence, MD PCP: Abigail Slater, MD  Chief Complaint  Patient presents with   Coronary artery disease involving native coronary artery of   Follow-up      History of Present Illness: .    Abigail Hoover is a 79 y.o. female with hypertension, type 2 diabetes mellitus, hypothyroidism, CKD stage 4-5, anemia, leg edema   Patient is here with her nephew Abigail Hoover.  She has had recent retrosternal chest pain episodes lasting for 5-10 minutes.  She is not taking sublingual nitroglycerin .  She is compliant with her medical therapy.  Reviewed recent lab results, which note GFR ranging between 14-22.   Vitals:   04/01/24 1536 04/01/24 1537  BP: (!) 146/56 135/66  Pulse: 63 60  Resp: 17   SpO2: 100% 97%       ROS:  Review of Systems  Cardiovascular:  Positive for chest pain. Negative for dyspnea on exertion, leg swelling, palpitations and syncope.     Studies Reviewed: Abigail Hoover        EKG 04/01/2024: Normal sinus rhythm Normal ECG When compared with ECG of 05-May-2023 11:53, No significant change was found     PET/CT stress test 05/2023:   The study has no evidence of epicardial disease. There is abnormal myocardial blood flow reserve; this can be seen in microvascular disease.  There is also high resting flow, which can be seen in CKD but may lead to false positives.  The study is likely an intermediate risk.   LV perfusion is normal. There is no evidence of ischemia. There is no evidence of infarction.   Rest left ventricular function is normal. Rest EF: 70%. Stress left ventricular function is normal. Stress EF: 73%. End diastolic cavity size is normal. End systolic cavity size is normal.   Myocardial blood flow was computed to be 1.64ml/g/min at rest and 1.21ml/g/min at stress. Global myocardial blood  flow reserve was 1.47 and was abnormal.   Coronary calcium  was present on the attenuation correction CT images. Mild coronary calcifications were present. Coronary calcifications were present in the left anterior descending artery and right coronary artery distribution(s). Aortic atherosclerosis.  Labs 8-01/2024: Chol 164, TG 76, HDL 74, LDL 76 HbA1C 7.5% Hb 10.4 Cr 3.27, na 130, eGFR 14-->22  Labs 02/2023: Na 127 Cr 1.91, eGFR 26   Physical Exam:   Physical Exam Vitals and nursing note reviewed.  Constitutional:      General: She is not in acute distress. Neck:     Vascular: No JVD.  Cardiovascular:     Rate and Rhythm: Normal rate and regular rhythm.     Heart sounds: Normal heart sounds. No murmur heard. Pulmonary:     Effort: Pulmonary effort is normal.     Breath sounds: Normal breath sounds. No wheezing or rales.  Musculoskeletal:     Right lower leg: Edema (Trace) present.     Left lower leg: Edema (Trace) present.      VISIT DIAGNOSES:   ICD-10-CM   1. Coronary artery disease of native artery of native heart with stable angina pectoris  I25.118 EKG 12-Lead    2. Essential hypertension  I10     3. CKD (chronic kidney disease) stage 4, GFR 15-29 ml/min (HCC)  N18.4          ASSESSMENT AND  PLAN: .    Abigail Hoover is a 79 y.o. female with hypertension, type 2 diabetes mellitus, hypothyroidism, CKD stage 4, anemia, leg edema, now with possible angina symptoms   Chest pain: Recurrent chest pain.  Previous PET/CT stress test suggesting possible microvascular dysfunction.  With her GFR of 14-22, advanced age and not wanting to be on dialysis, heart catheterization is high risk and not recommended.  Recommend medical management. Increase amlodipine  from 2.5 mg daily to 5 mg daily. Continue metoprolol , Imdur . Encouraged the patient to use sublingual nitroglycerin  for chest pain lasting for more than 5-10 minutes.  Hypertension: Controlled  Type 2  diabetes mellitus: Continue follow-up with endocrinology.      F/u in 3 months  Signed, Abigail JINNY Lawrence, MD  "

## 2024-04-02 ENCOUNTER — Other Ambulatory Visit: Payer: Self-pay

## 2024-04-02 ENCOUNTER — Other Ambulatory Visit (HOSPITAL_COMMUNITY): Payer: Self-pay

## 2024-04-02 MED ORDER — PROLIA 60 MG/ML ~~LOC~~ SOSY
60.0000 mg | PREFILLED_SYRINGE | SUBCUTANEOUS | 0 refills | Status: AC
  Filled 2024-04-02 – 2024-04-07 (×2): qty 1, 180d supply, fill #0

## 2024-04-02 NOTE — Telephone Encounter (Signed)
 Returned call to daughter - they will move forward with Prolia  and pay cash price. Rx sent to [facility] with note to contact [name] about cash price.    Will work with spec pharmacy to see if Jubbonti  can be returned  Sherry Pennant, PharmD, MPH, BCPS, CPP Clinical Pharmacist

## 2024-04-02 NOTE — Addendum Note (Signed)
 Addended by: DAYNE SHERRY RAMAN on: 04/02/2024 01:11 PM   Modules accepted: Orders

## 2024-04-02 NOTE — Telephone Encounter (Signed)
 Spoke with Abigail Hoover and she advised that after discussing with her mother they would like to proceed with the name brand Prolia  and pay out of pocket. Derick is requesting a call back from [facility].

## 2024-04-05 ENCOUNTER — Inpatient Hospital Stay: Attending: Hematology and Oncology

## 2024-04-05 ENCOUNTER — Inpatient Hospital Stay

## 2024-04-05 ENCOUNTER — Inpatient Hospital Stay: Admitting: Hematology and Oncology

## 2024-04-05 VITALS — BP 149/50 | HR 65 | Temp 97.9°F | Resp 18 | Ht 60.0 in | Wt 119.2 lb

## 2024-04-05 DIAGNOSIS — E538 Deficiency of other specified B group vitamins: Secondary | ICD-10-CM

## 2024-04-05 DIAGNOSIS — N183 Chronic kidney disease, stage 3 unspecified: Secondary | ICD-10-CM | POA: Diagnosis not present

## 2024-04-05 DIAGNOSIS — D631 Anemia in chronic kidney disease: Secondary | ICD-10-CM | POA: Insufficient documentation

## 2024-04-05 DIAGNOSIS — Z79899 Other long term (current) drug therapy: Secondary | ICD-10-CM | POA: Insufficient documentation

## 2024-04-05 LAB — CBC WITH DIFFERENTIAL/PLATELET
Abs Immature Granulocytes: 0.04 K/uL (ref 0.00–0.07)
Basophils Absolute: 0 K/uL (ref 0.0–0.1)
Basophils Relative: 0 %
Eosinophils Absolute: 0.1 K/uL (ref 0.0–0.5)
Eosinophils Relative: 1 %
HCT: 29.4 % — ABNORMAL LOW (ref 36.0–46.0)
Hemoglobin: 9.5 g/dL — ABNORMAL LOW (ref 12.0–15.0)
Immature Granulocytes: 1 %
Lymphocytes Relative: 18 %
Lymphs Abs: 1.1 K/uL (ref 0.7–4.0)
MCH: 29 pg (ref 26.0–34.0)
MCHC: 32.3 g/dL (ref 30.0–36.0)
MCV: 89.6 fL (ref 80.0–100.0)
Monocytes Absolute: 0.4 K/uL (ref 0.1–1.0)
Monocytes Relative: 6 %
Neutro Abs: 4.8 K/uL (ref 1.7–7.7)
Neutrophils Relative %: 74 %
Platelets: 179 K/uL (ref 150–400)
RBC: 3.28 MIL/uL — ABNORMAL LOW (ref 3.87–5.11)
RDW: 15.5 % (ref 11.5–15.5)
WBC: 6.5 K/uL (ref 4.0–10.5)
nRBC: 0 % (ref 0.0–0.2)

## 2024-04-05 MED ORDER — DARBEPOETIN ALFA 200 MCG/0.4ML IJ SOSY
200.0000 ug | PREFILLED_SYRINGE | Freq: Once | INTRAMUSCULAR | Status: AC
  Administered 2024-04-05: 200 ug via SUBCUTANEOUS
  Filled 2024-04-05: qty 0.4

## 2024-04-05 MED ORDER — CYANOCOBALAMIN 1000 MCG/ML IJ SOLN
1000.0000 ug | Freq: Once | INTRAMUSCULAR | Status: AC
  Administered 2024-04-05: 1000 ug via INTRAMUSCULAR
  Filled 2024-04-05: qty 1

## 2024-04-05 NOTE — Assessment & Plan Note (Addendum)
 She has anemia chronic kidney disease and is receiving intermittent doses of darbepoetin injection to keep hemoglobin greater than 10 Her blood count is satisfactory and we will proceed with darbepoetin injection today I plan to see her back in 2 months for further follow-up We will continue ESA injection

## 2024-04-05 NOTE — Progress Notes (Signed)
" ° °  [facility] OFFICE PROGRESS NOTE  Koirala, Dibas, MD  ASSESSMENT & PLAN:  Assessment & Plan Anemia in stage 3 chronic kidney disease, unspecified whether stage 3a or 3b CKD (HCC) She has anemia chronic kidney disease and is receiving intermittent doses of darbepoetin injection to keep hemoglobin greater than 10 Her blood count is satisfactory and we will proceed with darbepoetin injection today I plan to see her back in 2 months for further follow-up We will continue ESA injection  Vitamin B 12 deficiency She has intermittent B12 deficiency Repeat B12 level from September 2025 was adequate She will continue vitamin B12 injection every 3 months    No orders of the defined types were placed in this encounter.   INTERVAL HISTORY: Patient returns for recurrent anemia Symptoms of anemia includes none We reviewed CBC result  SUMMARY OF HEMATOLOGIC HISTORY:  This is a patient that has been followed here since 2011 for chronic anemia In 2011, she had a bone marrow aspirate and biopsy that came back normal. The patient has been receiving Aranesp  injection for chronic anemia up until 2014. It was subsequently discontinued due stability of the anemia. Starting 05/27/2014, darbepoetin was resumed at 200 g every other month to keep hemoglobin greater than 10 g. Treatment was discontinued in October 2016 and resumed in September 2017 The patient is subsequently found to have severe vitamin B12 deficiency and is started on vitamin B12 injection on December 25, 2016 along with resumption of darbepoetin injection   Lab Results  Component Value Date   VITAMINB12 787 12/09/2023   FERRITIN 633 (H) 12/09/2023   HGB 9.5 (L) 04/05/2024   RBC 3.28 (L) 04/05/2024   Vitals:   04/05/24 1043  BP: (!) 149/50  Pulse: 65  Resp: 18  Temp: 97.9 F (36.6 C)  SpO2: 100%   "

## 2024-04-05 NOTE — Assessment & Plan Note (Addendum)
 She has intermittent B12 deficiency Repeat B12 level from September 2025 was adequate She will continue vitamin B12 injection every 3 months

## 2024-04-06 ENCOUNTER — Other Ambulatory Visit: Payer: Self-pay

## 2024-04-07 ENCOUNTER — Encounter: Payer: Self-pay | Admitting: Hematology and Oncology

## 2024-04-07 ENCOUNTER — Other Ambulatory Visit (HOSPITAL_COMMUNITY): Payer: Self-pay

## 2024-04-07 ENCOUNTER — Other Ambulatory Visit: Payer: Self-pay

## 2024-04-07 NOTE — Progress Notes (Signed)
 Jubbonti  returned and reversed per [facility] request and [facility] approval.

## 2024-04-07 NOTE — Progress Notes (Signed)
 Specialty Pharmacy Refill Coordination Note  NAELLE DIEGEL is a 79 y.o. female contacted today regarding refills of specialty medication(s) Denosumab  (Prolia )   Patient requested Courier to Provider Office   Delivery date: 04/08/24   Verified address: Courier to rheum: [address], [address], [place] [address]   Medication will be filled on: 04/07/24  Per Devki, patient daughter [name] refused Jubbonti . Medication switched back to Prolia  for family to pay out of pocket. Devki previously made [name] aware of approximate copayment. Called today to speak with [name] who is currently out of the country. Darden Pinks states he can speak on patient behalf and is willing to pay out of pocket price [phone] since that is what his aunt had indicated. One time cc has been provided for payment.

## 2024-04-08 NOTE — Telephone Encounter (Signed)
 Patient scheduled for 04/13/2024 at 1:30 pm for Prolia 

## 2024-04-09 ENCOUNTER — Encounter (INDEPENDENT_AMBULATORY_CARE_PROVIDER_SITE_OTHER): Payer: Medicare Other | Admitting: Ophthalmology

## 2024-04-09 DIAGNOSIS — I10 Essential (primary) hypertension: Secondary | ICD-10-CM | POA: Diagnosis not present

## 2024-04-09 DIAGNOSIS — H43813 Vitreous degeneration, bilateral: Secondary | ICD-10-CM

## 2024-04-09 DIAGNOSIS — Z7985 Long-term (current) use of injectable non-insulin antidiabetic drugs: Secondary | ICD-10-CM

## 2024-04-09 DIAGNOSIS — Z794 Long term (current) use of insulin: Secondary | ICD-10-CM | POA: Diagnosis not present

## 2024-04-09 DIAGNOSIS — H35033 Hypertensive retinopathy, bilateral: Secondary | ICD-10-CM

## 2024-04-09 DIAGNOSIS — E113393 Type 2 diabetes mellitus with moderate nonproliferative diabetic retinopathy without macular edema, bilateral: Secondary | ICD-10-CM | POA: Diagnosis not present

## 2024-04-14 ENCOUNTER — Ambulatory Visit: Attending: Rheumatology | Admitting: *Deleted

## 2024-04-14 DIAGNOSIS — Z5181 Encounter for therapeutic drug level monitoring: Secondary | ICD-10-CM | POA: Diagnosis not present

## 2024-04-14 DIAGNOSIS — M8000XS Age-related osteoporosis with current pathological fracture, unspecified site, sequela: Secondary | ICD-10-CM

## 2024-04-14 LAB — COMPREHENSIVE METABOLIC PANEL WITH GFR
AG Ratio: 2.8 (calc) — ABNORMAL HIGH (ref 1.0–2.5)
ALT: 14 U/L (ref 6–29)
AST: 15 U/L (ref 10–35)
Albumin: 4.4 g/dL (ref 3.6–5.1)
Alkaline phosphatase (APISO): 52 U/L (ref 37–153)
BUN/Creatinine Ratio: 15 (calc) (ref 6–22)
BUN: 35 mg/dL — ABNORMAL HIGH (ref 7–25)
CO2: 28 mmol/L (ref 20–32)
Calcium: 9.4 mg/dL (ref 8.6–10.4)
Chloride: 93 mmol/L — ABNORMAL LOW (ref 98–110)
Creat: 2.33 mg/dL — ABNORMAL HIGH (ref 0.60–0.95)
Globulin: 1.6 g/dL — ABNORMAL LOW (ref 1.9–3.7)
Glucose, Bld: 217 mg/dL — ABNORMAL HIGH (ref 65–99)
Potassium: 3.7 mmol/L (ref 3.5–5.3)
Sodium: 131 mmol/L — ABNORMAL LOW (ref 135–146)
Total Bilirubin: 0.6 mg/dL (ref 0.2–1.2)
Total Protein: 6 g/dL — ABNORMAL LOW (ref 6.1–8.1)
eGFR: 21 mL/min/1.73m2 — ABNORMAL LOW

## 2024-04-14 MED ORDER — DENOSUMAB 60 MG/ML ~~LOC~~ SOSY
60.0000 mg | PREFILLED_SYRINGE | SUBCUTANEOUS | Status: AC
  Administered 2024-04-14: 60 mg via SUBCUTANEOUS

## 2024-04-14 NOTE — Progress Notes (Signed)
 Subjective:   Patient presents to clinic today to receive bi-annual dose of denosumab .  Patient running a fever or have signs/symptoms of infection? No  Patient currently on antibiotics for the treatment of infection? No  Patient had fall in the last 6 months?  No  If yes, did it require medical attention? No   Patient taking calcium  1200 mg daily through diet or supplement and at least 800 units vitamin D ? Yes  Objective: CMP     Component Value Date/Time   NA 130 (L) 11/03/2023 1149   NA 138 12/12/2016 1150   K 4.2 11/03/2023 1149   K 4.6 12/12/2016 1150   CL 85 (L) 11/03/2023 1149   CL 103 07/27/2012 1259   CO2 24 11/03/2023 1149   CO2 31 (H) 12/12/2016 1150   GLUCOSE 171 (H) 11/03/2023 1149   GLUCOSE 142 (H) 02/26/2023 0257   GLUCOSE 149 (H) 12/12/2016 1150   GLUCOSE 118 (H) 07/27/2012 1259   BUN 65 (H) 11/03/2023 1149   BUN 23.4 12/12/2016 1150   CREATININE 3.27 (H) 11/03/2023 1149   CREATININE 2.07 (H) 02/13/2023 1119   CREATININE 2.31 (H) 07/23/2022 1413   CREATININE 1.7 (H) 12/12/2016 1150   CALCIUM  10.5 (H) 11/03/2023 1149   CALCIUM  11.4 (H) 12/12/2016 1150   PROT 6.0 (L) 02/13/2023 1119   PROT 6.5 12/12/2016 1150   ALBUMIN 3.9 02/13/2023 1119   ALBUMIN 3.9 12/12/2016 1150   AST 18 02/13/2023 1119   AST 19 12/12/2016 1150   ALT 12 02/13/2023 1119   ALT 10 12/12/2016 1150   ALKPHOS 59 02/13/2023 1119   ALKPHOS 65 12/12/2016 1150   BILITOT 0.4 02/13/2023 1119   BILITOT 0.36 12/12/2016 1150   GFRNONAA 26 (L) 02/26/2023 0257   GFRNONAA 24 (L) 02/13/2023 1119   GFRNONAA 21 (L) 09/20/2019 1506   GFRAA 25 (L) 09/20/2019 1506    CBC    Component Value Date/Time   WBC 6.5 04/05/2024 1019   RBC 3.28 (L) 04/05/2024 1019   HGB 9.5 (L) 04/05/2024 1019   HGB 11.0 (L) 11/03/2023 1149   HGB 10.9 (L) 03/21/2017 1221   HCT 29.4 (L) 04/05/2024 1019   HCT 35.6 11/03/2023 1149   HCT 35.9 03/21/2017 1221   PLT 179 04/05/2024 1019   PLT 262 11/03/2023 1149   MCV  89.6 04/05/2024 1019   MCV 90 11/03/2023 1149   MCV 82.5 03/21/2017 1221   MCH 29.0 04/05/2024 1019   MCHC 32.3 04/05/2024 1019   RDW 15.5 04/05/2024 1019   RDW 15.0 11/03/2023 1149   RDW 15.7 (H) 03/21/2017 1221   LYMPHSABS 1.1 04/05/2024 1019   LYMPHSABS 1.1 03/21/2017 1221   MONOABS 0.4 04/05/2024 1019   MONOABS 0.3 03/21/2017 1221   EOSABS 0.1 04/05/2024 1019   EOSABS 0.3 03/21/2017 1221   BASOSABS 0.0 04/05/2024 1019   BASOSABS 0.0 03/21/2017 1221    Lab Results  Component Value Date   VD25OH 76.06 02/13/2023    T-score:-2.4, BMD 0.587 04/23/22    Assessment/Plan:   Administrations This Visit     denosumab  (PROLIA ) injection 60 mg     Admin Date 04/14/2024 Action Given Dose 60 mg Route Subcutaneous Documented By Cena Alfonso CROME, LPN             Reviewed importance of adequate dietary intake of calcium  in addition to supplementation due to risk of hypocalcemia with denosumab .   Patient tolerated injection well.   Administration details as below:  Patient's next denosumab   dose is due on 10/12/2024.  Patient is due for updated DEXA in 04/23/2024.   All questions encouraged and answered.  Instructed patient to call with any further questions or concerns.

## 2024-04-15 ENCOUNTER — Ambulatory Visit: Payer: Self-pay | Admitting: Rheumatology

## 2024-04-15 NOTE — Progress Notes (Signed)
 Glucose was elevated.  Creatinine is higher than stable.  Please forward results to her PCP.

## 2024-04-16 ENCOUNTER — Ambulatory Visit: Admitting: Podiatry

## 2024-04-16 ENCOUNTER — Ambulatory Visit: Admitting: Rheumatology

## 2024-04-27 ENCOUNTER — Telehealth: Payer: Self-pay | Admitting: Rheumatology

## 2024-04-27 ENCOUNTER — Encounter: Payer: Self-pay | Admitting: Nephrology

## 2024-04-27 ENCOUNTER — Ambulatory Visit
Admission: RE | Admit: 2024-04-27 | Discharge: 2024-04-27 | Disposition: A | Source: Ambulatory Visit | Attending: Nephrology | Admitting: Nephrology

## 2024-04-27 ENCOUNTER — Other Ambulatory Visit: Payer: Self-pay | Admitting: Nephrology

## 2024-04-27 DIAGNOSIS — S0990XA Unspecified injury of head, initial encounter: Secondary | ICD-10-CM

## 2024-04-27 NOTE — Telephone Encounter (Signed)
 Pt called stating she fell and is have pain in her back and left knee. Pt was offered 04/28/24 but declined. Advised pt the next spot that is open is 07/16/24. Pt wanted to be seen ASAP and would like someone to talk to her in clinic.

## 2024-04-27 NOTE — Telephone Encounter (Signed)
 Spoke with patient and advised her to schedule an appointment with orthopedics. Patient states she sees Dr. Kay and will call his office to schedule.

## 2024-04-29 ENCOUNTER — Emergency Department (HOSPITAL_BASED_OUTPATIENT_CLINIC_OR_DEPARTMENT_OTHER)
Admission: EM | Admit: 2024-04-29 | Discharge: 2024-04-29 | Disposition: A | Discharge status: Home / Self Care | Source: Home / Self Care | Attending: Emergency Medicine | Admitting: Emergency Medicine

## 2024-04-29 ENCOUNTER — Emergency Department (HOSPITAL_BASED_OUTPATIENT_CLINIC_OR_DEPARTMENT_OTHER)

## 2024-04-29 ENCOUNTER — Encounter (HOSPITAL_BASED_OUTPATIENT_CLINIC_OR_DEPARTMENT_OTHER): Payer: Self-pay | Admitting: Emergency Medicine

## 2024-04-29 ENCOUNTER — Other Ambulatory Visit: Payer: Self-pay

## 2024-04-29 DIAGNOSIS — S20221A Contusion of right back wall of thorax, initial encounter: Secondary | ICD-10-CM

## 2024-04-29 DIAGNOSIS — S298XXA Other specified injuries of thorax, initial encounter: Secondary | ICD-10-CM

## 2024-04-29 NOTE — ED Provider Notes (Signed)
 " [place] EMERGENCY DEPARTMENT AT [facility] Provider Note   CSN: [id number] Arrival date & time: 04/29/24  1055     Patient presents with: Abigail Hoover is a 79 y.o. female.  Who presents to the ED for injuries after fall.  Patient fell on a wet floor 2 days ago and was seen at an outside facility.  CT head was unremarkable at that time but now she is reporting lower back pain and pain on left lateral ribs.  No shortness of breath repeated falls or other injuries    Fall       Prior to Admission medications  Medication Sig Start Date End Date Taking? Authorizing Provider  allopurinol (ZYLOPRIM) 100 MG tablet Take 1 tablet by mouth daily.    [provider]  amLODipine  (NORVASC ) 5 MG tablet Take 1 tablet (5 mg total) by mouth daily. 04/01/24   Patwardhan, Newman PARAS, MD  aspirin  EC 81 MG tablet Take 1 tablet (81 mg total) by mouth daily. 10/24/23   Patwardhan, Newman PARAS, MD  colchicine  0.6 MG tablet TAKE 1/2 TABLET BY MOUTH EVERY DAY 11/30/23   Dolphus Reiter, MD  Continuous Blood Gluc Sensor (DEXCOM G7 SENSOR) MISC Inject 1 sensor to the skin every 10 days for continuous glucose monitoring. 01/29/22   [provider]  Cyanocobalamin  (VITAMIN B-12 IJ) Inject as directed every 3 (three) months.    [provider]  Darbepoetin Alfa  (ARANESP ) 60 MCG/0.3ML SOSY injection Inject 60 mcg into the skin.    [provider]  denosumab -bbdz (JUBBONTI ) 60 MG/ML SOSY injection Inject 60 mg into the skin every 6 (six) months. Courier to rheum: [address], [address], [place] [address]. Appt on 03/24/2024 03/22/24   Dolphus Reiter, MD  dexlansoprazole (DEXILANT) 60 MG capsule as directed Orally per PCP    [provider]  docusate sodium  (COLACE) 100 MG capsule Take 400 mg by mouth daily.    [provider]  famotidine (PEPCID) 40 MG tablet Take 40 mg by mouth 2 (two) times daily.    [provider]  folic acid   (FOLVITE ) 1 MG tablet 1 tablet Orally Once a day; Duration: 30 day(s) 10/28/23   [provider]  furosemide  (LASIX ) 40 MG tablet Take 40 mg by mouth 2 (two) times daily. Patient not taking: Reported on 04/01/2024    [provider]  GEMTESA 75 MG TABS Take 1 tablet by mouth daily.    [provider]  glucose blood (ONETOUCH VERIO) test strip Use Onetouch verio test strips to check blood sugar three times daily. (90 day RX) 05/04/21   Trixie File, MD  hydrocortisone 2.5 % ointment     [provider]  Insulin  Glargine (BASAGLAR  KWIKPEN) 100 UNIT/ML Inject 2 Units into the skin daily. 10/28/22   [provider]  Insulin  Pen Needle (PEN NEEDLES 3/16) 31G X 5 MM MISC Use 1 pen needle to inject Forteo  daily. 07/20/20   Dolphus Reiter, MD  Insulin  Pen Needle 31G X 5 MM MISC Use with Novolog  05/28/19   [place]on [place], MD  Insulin  Syringe-Needle U-100 25G X 5/8 1 ML MISC USE DAILY TO INJECT INSULIN  05/26/18   [place], MD  isosorbide  mononitrate (IMDUR ) 120 MG 24 hr tablet Take 1 tablet (120 mg total) by mouth every morning. 10/24/23   Patwardhan, Newman PARAS, MD  Lancet Devices (ONE TOUCH DELICA LANCING DEV) MISC 1 each by Does not apply route daily. Use as instructed  to check blood sugar once daily. 08/06/19   [place]on [place], MD  Lancets [facility] DELICA PLUS [place]) MISC 4 (four) times daily. 12/07/21   [provider]  mupirocin  ointment (BACTROBAN ) 2 % Apply to affected toe once daily. 01/13/24   Gaynel Delon CROME, DPM  nitroGLYCERIN  (NITROSTAT ) 0.4 MG SL tablet PLACE 1 TAB UNDER TONGUE FOR CHEST PAIN EVERY 5 MINUTES     MAXIMUM OF 3 DOSES     CALL 911 AFTER FIRST DOSE. 04/01/24   Patwardhan, Newman PARAS, MD  NOVOLOG  FLEXPEN 100 UNIT/ML FlexPen INJECT 9-11 UNITS UNDER THE SKIN BEFORE the 3 meals 02/19/21   Trixie File, MD  pantoprazole  (PROTONIX ) 40 MG tablet Take 40 mg by mouth every morning. 07/19/22   [provider]  pentosan polysulfate  (ELMIRON ) 100 MG capsule TAKE 1 CAPSULE (100 MG TOTAL) BY MOUTH 3 TIMES DAILY BEFORE MEALS. 09/29/18   [provider]  polyethylene glycol (MIRALAX  / GLYCOLAX ) 17 g packet as needed. 08/27/22   [provider]  pravastatin  (PRAVACHOL ) 20 MG tablet TAKE ONE TABLET BY MOUTH ONCE DAILY 07/30/21   Trixie File, MD  PREMARIN vaginal cream 1 applicatorful Vaginal Once a day    [provider]  PROLIA  60 MG/ML SOSY injection Inject 60 mg into the skin every 6 (six) months. Deliver to [facility] Rheumat[facility]: [address], [place], [place] [address]. Appt on 04/09/24 04/02/24   Dolphus Reiter, MD  sitaGLIPtin  (JANUVIA ) 25 MG tablet Take 25 mg by mouth daily.    [provider]  sucralfate (CARAFATE) 1 g tablet 1 tab(s) crush and mix in water 4 times a day (between meals and at bedtime) for 90 days 10/02/20   [provider]  SYNTHROID  75 MCG tablet TAKE ONE TABLET BY MOUTH ONCE DAILY 06/26/21   Trixie File, MD  TOPROL  XL 50 MG 24 hr tablet Take 1 tablet (50 mg total) by mouth daily. Take with or immediately following a meal. 11/03/23   Patwardhan, Manish J, MD  torsemide (DEMADEX) 20 MG tablet Take 1 tablet by mouth daily.    [provider]  triamcinolone  (KENALOG ) 0.025 % ointment Apply 1 application  topically 2 (two) times daily. 04/25/21   [provider]  trimethoprim  (TRIMPEX ) 100 MG tablet 1 tablet Orally Once a day    [provider]    Allergies: Atorvastatin and Ezetimibe    Review of Systems  Updated Vital Signs BP 132/63   Pulse (!) 56   Temp (!) 97 F (36.1 C)   Resp 17   SpO2 100%   Physical Exam Vitals and nursing note reviewed.  HENT:     Head:     Comments: Bruising over left face Eyes:     Pupils: Pupils are equal, round, and reactive to light.  Cardiovascular:     Rate and Rhythm: Normal rate and regular rhythm.  Pulmonary:     Effort: Pulmonary effort is normal.     Breath sounds:  Normal breath sounds.  Abdominal:     Palpations: Abdomen is soft.     Tenderness: There is no abdominal tenderness.  Musculoskeletal:     Cervical back: Neck supple. No tenderness.     Comments: No step-off deformity back Midline and right paraspinal lumbar tenderness Tenderness over left lateral ribs  Skin:    General: Skin is warm and dry.  Neurological:     Mental Status: She is alert.  Psychiatric:  Mood and Affect: Mood normal.     (all labs ordered are listed, but only abnormal results are displayed) Labs Reviewed - No data to display  EKG: None  Radiology: CT Chest Wo Contrast Result Date: 04/29/2024 CLINICAL DATA:  Left rib pain after fall today EXAM: CT CHEST WITHOUT CONTRAST TECHNIQUE: Multidetector CT imaging of the chest was performed following the standard protocol without IV contrast. RADIATION DOSE REDUCTION: This exam was performed according to the departmental dose-optimization program which includes automated exposure control, adjustment of the mA and/or kV according to patient size and/or use of iterative reconstruction technique. COMPARISON:  January 02, 2009. FINDINGS: Cardiovascular: Mild cardiomegaly. No evidence of thoracic aortic aneurysm. No pericardial effusion. Mild coronary artery calcifications are noted. Mediastinum/Nodes: No enlarged mediastinal or axillary lymph nodes. Thyroid  gland, trachea, and esophagus demonstrate no significant findings. Lungs/Pleura: No pneumothorax or pleural effusion is noted. Minimal reticular opacity is noted in superior segment of right lower lobe most consistent with scarring or subsegmental atelectasis. Minimal left basilar subsegmental atelectasis or scarring is noted. Upper Abdomen: No acute abnormality. Musculoskeletal: No chest wall mass or suspicious bone lesions identified. IMPRESSION: 1. Minimal bibasilar subsegmental atelectasis or scarring. 2. Mild coronary artery calcifications are noted. 3. No definite rib  fracture is noted. Electronically Signed   By: Lynwood Landy Raddle M.D.   On: 04/29/2024 14:50   CT Lumbar Spine Wo Contrast Result Date: 04/29/2024 CLINICAL DATA:  Back pain after falling on ice 2 days ago. History of spinal augmentation. EXAM: CT Thoracic and Lumbar spine without contrast TECHNIQUE: Multiplanar CT images of the thoracic and lumbar spine were reconstructed from contemporary CT of the Chest, Abdomen, and Pelvis. RADIATION DOSE REDUCTION: This exam was performed according to the departmental dose-optimization program which includes automated exposure control, adjustment of the mA and/or kV according to patient size and/or use of iterative reconstruction technique. CONTRAST:  No additional COMPARISON:  Abdominopelvic CT and CT lumbar spine 08/26/2022. Lumbar MRI 09/09/2022. FINDINGS: CT THORACIC SPINE FINDINGS Alignment: Physiologic. Vertebrae: No evidence of acute thoracic spine fracture or traumatic subluxation. No aggressive osseous lesions. Previous lower cervical fusion noted. Paraspinal and other soft tissues: No acute paraspinal findings are identified. There is atherosclerosis of the aorta, great vessels and coronary arteries. Scattered pulmonary scarring. No confluent airspace disease or suspicious pulmonary nodularity. Disc levels: Mild multilevel spondylosis with partially ankylosing endplate osteophytes and facet hypertrophy. Mild multilevel osseous foraminal narrowing, greatest on the left at T9-10. No evidence of large disc herniation. CT LUMBAR SPINE FINDINGS Segmentation: There are 5 lumbar type vertebral bodies. Alignment: Straightening without focal angulation or significant listhesis. Vertebrae: Previous spinal augmentation at L4 and L5. Stable mild superior endplate compression fracture at L3. No evidence of acute fracture or traumatic subluxation. Paraspinal and other soft tissues: No acute paraspinal findings. Aortic and branch vessel atherosclerosis. Disc levels: Multilevel  spondylosis with multilevel partially ankylosing endplate osteophytes and facet hypertrophy. No evidence of large disc herniation or high-grade spinal stenosis. Stable foraminal narrowing, greatest on the left at L4-5 (moderate). IMPRESSION: 1. No evidence of acute fracture or traumatic subluxation of the thoracolumbar spine. 2. Previous spinal augmentation at L4 and L5. Stable mild superior endplate compression fracture at L3. 3. Multilevel spondylosis without evidence of large disc herniation or high-grade spinal stenosis. Multilevel partially ankylosing endplate osteophytes and facet hypertrophy. 4.  Aortic Atherosclerosis (ICD10-I70.0). Electronically Signed   By: Elsie Perone M.D.   On: 04/29/2024 14:48   CT T-SPINE NO CHARGE Result Date: 04/29/2024 CLINICAL  DATA:  Back pain after falling on ice 2 days ago. History of spinal augmentation. EXAM: CT Thoracic and Lumbar spine without contrast TECHNIQUE: Multiplanar CT images of the thoracic and lumbar spine were reconstructed from contemporary CT of the Chest, Abdomen, and Pelvis. RADIATION DOSE REDUCTION: This exam was performed according to the departmental dose-optimization program which includes automated exposure control, adjustment of the mA and/or kV according to patient size and/or use of iterative reconstruction technique. CONTRAST:  No additional COMPARISON:  Abdominopelvic CT and CT lumbar spine 08/26/2022. Lumbar MRI 09/09/2022. FINDINGS: CT THORACIC SPINE FINDINGS Alignment: Physiologic. Vertebrae: No evidence of acute thoracic spine fracture or traumatic subluxation. No aggressive osseous lesions. Previous lower cervical fusion noted. Paraspinal and other soft tissues: No acute paraspinal findings are identified. There is atherosclerosis of the aorta, great vessels and coronary arteries. Scattered pulmonary scarring. No confluent airspace disease or suspicious pulmonary nodularity. Disc levels: Mild multilevel spondylosis with partially  ankylosing endplate osteophytes and facet hypertrophy. Mild multilevel osseous foraminal narrowing, greatest on the left at T9-10. No evidence of large disc herniation. CT LUMBAR SPINE FINDINGS Segmentation: There are 5 lumbar type vertebral bodies. Alignment: Straightening without focal angulation or significant listhesis. Vertebrae: Previous spinal augmentation at L4 and L5. Stable mild superior endplate compression fracture at L3. No evidence of acute fracture or traumatic subluxation. Paraspinal and other soft tissues: No acute paraspinal findings. Aortic and branch vessel atherosclerosis. Disc levels: Multilevel spondylosis with multilevel partially ankylosing endplate osteophytes and facet hypertrophy. No evidence of large disc herniation or high-grade spinal stenosis. Stable foraminal narrowing, greatest on the left at L4-5 (moderate). IMPRESSION: 1. No evidence of acute fracture or traumatic subluxation of the thoracolumbar spine. 2. Previous spinal augmentation at L4 and L5. Stable mild superior endplate compression fracture at L3. 3. Multilevel spondylosis without evidence of large disc herniation or high-grade spinal stenosis. Multilevel partially ankylosing endplate osteophytes and facet hypertrophy. 4.  Aortic Atherosclerosis (ICD10-I70.0). Electronically Signed   By: Elsie Perone M.D.   On: 04/29/2024 14:48   CT HEAD WO CONTRAST ( ) Result Date: 04/27/2024 CLINICAL DATA:  Closed head injury without concussion, initial encounter. Status post fall. EXAM: CT HEAD WITHOUT CONTRAST TECHNIQUE: Contiguous axial images were obtained from the base of the skull through the vertex without intravenous contrast. RADIATION DOSE REDUCTION: This exam was performed according to the departmental dose-optimization program which includes automated exposure control, adjustment of the mA and/or kV according to patient size and/or use of iterative reconstruction technique. COMPARISON:  Head CT 04/09/2023 FINDINGS:  Brain: No intracranial hemorrhage, mass effect, or midline shift. Age related atrophy. No hydrocephalus. The basilar cisterns are patent. Periventricular and deep white matter hypodensity typical of chronic small vessel ischemia, stable. Remote left cerebellar infarct. No evidence of territorial infarct or acute ischemia. No extra-axial or intracranial fluid collection. Vascular: Atherosclerosis of skullbase vasculature without hyperdense vessel or abnormal calcification. Skull: No fracture or focal lesion. Sinuses/Orbits: Paranasal sinuses and mastoid air cells are clear. The visualized orbits are unremarkable. Bilateral cataract resection. Other: Small left frontal scalp hematoma. IMPRESSION: 1. Small left frontal scalp hematoma. No acute intracranial abnormality. No skull fracture. 2. Age related atrophy and chronic small vessel ischemia. Remote left cerebellar infarct. Electronically Signed   By: Andrea Gasman M.D.   On: 04/27/2024 15:50     Procedures   Medications Ordered in the ED - No data to display  Clinical Course as of 04/29/24 1500  Thu Apr 29, 2024  1459 No acute traumatic findings on  imaging.  Appropriate for discharge with close PCP follow-up.  Counseled patient on symptomatic management of her injuries after fall 2 days ago. [MP]    Clinical Course User Index [MP] Pamella Ozell LABOR, DO                                 Medical Decision Making 79 year old female with history as above presenting after fall 2 days ago.  CT head was normal at that time but now she is having pain in the lower back mostly on the right side and left lateral ribs concerning for vertebral fractures or rib fractures.  No other injuries hemodynamically stable well-appearing on my exam.  Will obtain CT chest to look for rib fractures along with CT thoracic and lumbar spine to look for any evidence of fracture or dislocation of the back.  Amount and/or Complexity of Data Reviewed Radiology:  ordered.        Final diagnoses:  Contusion of right side of back, initial encounter  Contusion of rib, initial encounter    ED Discharge Orders     None          Pamella Ozell LABOR, DO 04/29/24 1500  "

## 2024-04-29 NOTE — Discharge Instructions (Signed)
 You were seen in the Emergency Department for injuries after a fall 2 days ago There were no evidence of vertebral fractures or rib fractures Continue taking Tylenol  as directed for pain and discomfort Follow-up with your primary doctor in 1 week for reevaluation Careful walking on the ice

## 2024-04-29 NOTE — ED Triage Notes (Signed)
 Fall slip on ice/wet floor Fall on Tuesday Bruise on left side face Family helped to triage CT done at emerg ortho  Now c/o  pain in lower back.

## 2024-04-29 NOTE — ED Notes (Signed)
 Reviewed AVS/discharge instructions with patient. Time allotted for and all questions answered. Patient is agreeable for d/c and escorted to ED exit by staff.

## 2024-04-30 ENCOUNTER — Other Ambulatory Visit: Payer: Self-pay | Admitting: Nephrology

## 2024-04-30 DIAGNOSIS — S0990XA Unspecified injury of head, initial encounter: Secondary | ICD-10-CM

## 2024-05-31 ENCOUNTER — Inpatient Hospital Stay: Admitting: Hematology and Oncology

## 2024-05-31 ENCOUNTER — Inpatient Hospital Stay

## 2024-05-31 ENCOUNTER — Inpatient Hospital Stay: Attending: Hematology and Oncology

## 2024-07-05 ENCOUNTER — Ambulatory Visit: Admitting: Cardiology

## 2024-07-29 ENCOUNTER — Ambulatory Visit: Admitting: Rheumatology

## 2025-04-11 ENCOUNTER — Encounter (INDEPENDENT_AMBULATORY_CARE_PROVIDER_SITE_OTHER): Admitting: Ophthalmology
